# Patient Record
Sex: Female | Born: 1945 | Race: White | Hispanic: No | Marital: Married | Smoking: Never smoker
Health system: Southern US, Community
[De-identification: ages and names within clinical notes are randomized; demographics above are authoritative.]

## PROBLEM LIST (undated history)

## (undated) DIAGNOSIS — E871 Hypo-osmolality and hyponatremia: Secondary | ICD-10-CM

## (undated) DIAGNOSIS — K219 Gastro-esophageal reflux disease without esophagitis: Secondary | ICD-10-CM

## (undated) DIAGNOSIS — F0390 Unspecified dementia without behavioral disturbance: Secondary | ICD-10-CM

## (undated) DIAGNOSIS — IMO0001 Reserved for inherently not codable concepts without codable children: Secondary | ICD-10-CM

## (undated) DIAGNOSIS — F419 Anxiety disorder, unspecified: Secondary | ICD-10-CM

## (undated) DIAGNOSIS — I10 Essential (primary) hypertension: Secondary | ICD-10-CM

## (undated) DIAGNOSIS — I639 Cerebral infarction, unspecified: Secondary | ICD-10-CM

## (undated) DIAGNOSIS — R413 Other amnesia: Secondary | ICD-10-CM

## (undated) DIAGNOSIS — F1010 Alcohol abuse, uncomplicated: Secondary | ICD-10-CM

## (undated) DIAGNOSIS — R0602 Shortness of breath: Secondary | ICD-10-CM

## (undated) DIAGNOSIS — E785 Hyperlipidemia, unspecified: Secondary | ICD-10-CM

## (undated) DIAGNOSIS — R410 Disorientation, unspecified: Secondary | ICD-10-CM

## (undated) DIAGNOSIS — R569 Unspecified convulsions: Secondary | ICD-10-CM

## (undated) DIAGNOSIS — R296 Repeated falls: Secondary | ICD-10-CM

## (undated) DIAGNOSIS — K5790 Diverticulosis of intestine, part unspecified, without perforation or abscess without bleeding: Secondary | ICD-10-CM

## (undated) DIAGNOSIS — W19XXXA Unspecified fall, initial encounter: Secondary | ICD-10-CM

## (undated) DIAGNOSIS — F329 Major depressive disorder, single episode, unspecified: Secondary | ICD-10-CM

## (undated) DIAGNOSIS — T7840XA Allergy, unspecified, initial encounter: Secondary | ICD-10-CM

## (undated) DIAGNOSIS — M1990 Unspecified osteoarthritis, unspecified site: Secondary | ICD-10-CM

## (undated) DIAGNOSIS — F32A Depression, unspecified: Secondary | ICD-10-CM

## (undated) HISTORY — DX: Unspecified fall, initial encounter: W19.XXXA

## (undated) HISTORY — PX: TOTAL HIP ARTHROPLASTY: SHX124

## (undated) HISTORY — DX: Hyperlipidemia, unspecified: E78.5

## (undated) HISTORY — DX: Gastro-esophageal reflux disease without esophagitis: K21.9

## (undated) HISTORY — DX: Unspecified osteoarthritis, unspecified site: M19.90

## (undated) HISTORY — DX: Other amnesia: R41.3

## (undated) HISTORY — PX: EYE SURGERY: SHX253

## (undated) HISTORY — DX: Allergy, unspecified, initial encounter: T78.40XA

## (undated) HISTORY — DX: Major depressive disorder, single episode, unspecified: F32.9

## (undated) HISTORY — PX: JOINT REPLACEMENT: SHX530

## (undated) HISTORY — DX: Cerebral infarction, unspecified: I63.9

## (undated) HISTORY — DX: Repeated falls: R29.6

## (undated) HISTORY — DX: Reserved for inherently not codable concepts without codable children: IMO0001

## (undated) HISTORY — DX: Disorientation, unspecified: R41.0

## (undated) HISTORY — DX: Unspecified dementia, unspecified severity, without behavioral disturbance, psychotic disturbance, mood disturbance, and anxiety: F03.90

## (undated) HISTORY — PX: SHOULDER ARTHROSCOPY: SHX128

## (undated) HISTORY — PX: BREAST SURGERY: SHX581

## (undated) HISTORY — DX: Essential (primary) hypertension: I10

## (undated) HISTORY — DX: Depression, unspecified: F32.A

## (undated) HISTORY — DX: Alcohol abuse, uncomplicated: F10.10

## (undated) HISTORY — PX: ABDOMINAL HYSTERECTOMY: SHX81

## (undated) HISTORY — DX: Diverticulosis of intestine, part unspecified, without perforation or abscess without bleeding: K57.90

---

## 1988-10-16 HISTORY — PX: NASAL SINUS SURGERY: SHX719

## 1998-09-15 ENCOUNTER — Ambulatory Visit (HOSPITAL_COMMUNITY): Admission: RE | Admit: 1998-09-15 | Discharge: 1998-09-15 | Payer: Self-pay | Admitting: Internal Medicine

## 1998-09-15 ENCOUNTER — Encounter: Payer: Self-pay | Admitting: Internal Medicine

## 1999-01-11 ENCOUNTER — Emergency Department (HOSPITAL_COMMUNITY): Admission: EM | Admit: 1999-01-11 | Discharge: 1999-01-11 | Payer: Self-pay | Admitting: Emergency Medicine

## 1999-10-04 ENCOUNTER — Ambulatory Visit (HOSPITAL_COMMUNITY): Admission: RE | Admit: 1999-10-04 | Discharge: 1999-10-04 | Payer: Self-pay | Admitting: Specialist

## 1999-10-04 ENCOUNTER — Encounter: Payer: Self-pay | Admitting: Specialist

## 1999-10-12 ENCOUNTER — Encounter: Payer: Self-pay | Admitting: Internal Medicine

## 1999-10-12 ENCOUNTER — Ambulatory Visit (HOSPITAL_COMMUNITY): Admission: RE | Admit: 1999-10-12 | Discharge: 1999-10-12 | Payer: Self-pay | Admitting: Internal Medicine

## 1999-11-29 ENCOUNTER — Encounter: Admission: RE | Admit: 1999-11-29 | Discharge: 1999-12-28 | Payer: Self-pay | Admitting: Internal Medicine

## 2000-06-14 ENCOUNTER — Emergency Department (HOSPITAL_COMMUNITY): Admission: EM | Admit: 2000-06-14 | Discharge: 2000-06-15 | Payer: Self-pay | Admitting: Emergency Medicine

## 2000-08-09 ENCOUNTER — Encounter: Payer: Self-pay | Admitting: Specialist

## 2000-08-10 ENCOUNTER — Ambulatory Visit (HOSPITAL_COMMUNITY): Admission: RE | Admit: 2000-08-10 | Discharge: 2000-08-10 | Payer: Self-pay | Admitting: Specialist

## 2000-08-10 ENCOUNTER — Encounter: Payer: Self-pay | Admitting: Specialist

## 2000-10-05 ENCOUNTER — Other Ambulatory Visit: Admission: RE | Admit: 2000-10-05 | Discharge: 2000-10-05 | Payer: Self-pay | Admitting: Obstetrics & Gynecology

## 2000-10-21 ENCOUNTER — Emergency Department (HOSPITAL_COMMUNITY): Admission: EM | Admit: 2000-10-21 | Discharge: 2000-10-21 | Payer: Self-pay | Admitting: Emergency Medicine

## 2000-10-22 ENCOUNTER — Encounter: Payer: Self-pay | Admitting: Orthopedic Surgery

## 2000-10-29 ENCOUNTER — Inpatient Hospital Stay (HOSPITAL_COMMUNITY): Admission: RE | Admit: 2000-10-29 | Discharge: 2000-11-02 | Payer: Self-pay | Admitting: Orthopedic Surgery

## 2000-10-29 ENCOUNTER — Encounter: Payer: Self-pay | Admitting: Orthopedic Surgery

## 2001-07-09 HISTORY — PX: DOBUTAMINE STRESS ECHO: SHX5426

## 2002-07-17 ENCOUNTER — Ambulatory Visit (HOSPITAL_COMMUNITY): Admission: RE | Admit: 2002-07-17 | Discharge: 2002-07-17 | Payer: Self-pay | Admitting: Internal Medicine

## 2002-07-17 ENCOUNTER — Encounter: Payer: Self-pay | Admitting: Internal Medicine

## 2002-09-04 ENCOUNTER — Other Ambulatory Visit: Admission: RE | Admit: 2002-09-04 | Discharge: 2002-09-04 | Payer: Self-pay | Admitting: Internal Medicine

## 2003-05-17 ENCOUNTER — Emergency Department (HOSPITAL_COMMUNITY): Admission: EM | Admit: 2003-05-17 | Discharge: 2003-05-17 | Payer: Self-pay | Admitting: Emergency Medicine

## 2003-05-17 ENCOUNTER — Encounter: Payer: Self-pay | Admitting: Emergency Medicine

## 2003-12-17 ENCOUNTER — Other Ambulatory Visit: Admission: RE | Admit: 2003-12-17 | Discharge: 2003-12-17 | Payer: Self-pay | Admitting: Internal Medicine

## 2004-01-05 ENCOUNTER — Ambulatory Visit (HOSPITAL_COMMUNITY): Admission: RE | Admit: 2004-01-05 | Discharge: 2004-01-05 | Payer: Self-pay | Admitting: Internal Medicine

## 2004-01-09 ENCOUNTER — Emergency Department (HOSPITAL_COMMUNITY): Admission: EM | Admit: 2004-01-09 | Discharge: 2004-01-09 | Payer: Self-pay | Admitting: Emergency Medicine

## 2004-09-05 ENCOUNTER — Ambulatory Visit: Payer: Self-pay | Admitting: Internal Medicine

## 2004-10-05 ENCOUNTER — Ambulatory Visit: Payer: Self-pay | Admitting: Internal Medicine

## 2004-12-15 ENCOUNTER — Encounter: Admission: RE | Admit: 2004-12-15 | Discharge: 2004-12-15 | Payer: Self-pay | Admitting: Internal Medicine

## 2004-12-19 ENCOUNTER — Ambulatory Visit: Payer: Self-pay | Admitting: Pulmonary Disease

## 2005-02-15 ENCOUNTER — Ambulatory Visit: Payer: Self-pay | Admitting: Pulmonary Disease

## 2005-02-15 ENCOUNTER — Ambulatory Visit: Payer: Self-pay | Admitting: Internal Medicine

## 2005-04-12 ENCOUNTER — Ambulatory Visit: Payer: Self-pay | Admitting: Pulmonary Disease

## 2005-05-02 ENCOUNTER — Ambulatory Visit: Payer: Self-pay | Admitting: Pulmonary Disease

## 2005-05-25 ENCOUNTER — Ambulatory Visit: Payer: Self-pay | Admitting: Internal Medicine

## 2005-06-01 ENCOUNTER — Ambulatory Visit: Payer: Self-pay | Admitting: Internal Medicine

## 2005-07-17 ENCOUNTER — Ambulatory Visit: Payer: Self-pay | Admitting: Pulmonary Disease

## 2005-07-18 ENCOUNTER — Ambulatory Visit: Payer: Self-pay | Admitting: Internal Medicine

## 2005-07-27 ENCOUNTER — Ambulatory Visit: Payer: Self-pay | Admitting: Cardiology

## 2005-07-31 ENCOUNTER — Ambulatory Visit: Payer: Self-pay | Admitting: Pulmonary Disease

## 2005-08-16 ENCOUNTER — Ambulatory Visit: Payer: Self-pay | Admitting: Internal Medicine

## 2005-08-24 ENCOUNTER — Ambulatory Visit (HOSPITAL_COMMUNITY): Admission: RE | Admit: 2005-08-24 | Discharge: 2005-08-24 | Payer: Self-pay | Admitting: Internal Medicine

## 2005-08-29 ENCOUNTER — Ambulatory Visit (HOSPITAL_COMMUNITY): Admission: RE | Admit: 2005-08-29 | Discharge: 2005-08-29 | Payer: Self-pay | Admitting: Internal Medicine

## 2005-08-31 ENCOUNTER — Ambulatory Visit: Payer: Self-pay | Admitting: Internal Medicine

## 2005-09-18 ENCOUNTER — Ambulatory Visit: Payer: Self-pay | Admitting: Internal Medicine

## 2005-10-31 ENCOUNTER — Ambulatory Visit: Payer: Self-pay | Admitting: Pulmonary Disease

## 2005-11-03 ENCOUNTER — Ambulatory Visit: Payer: Self-pay | Admitting: Internal Medicine

## 2005-12-05 ENCOUNTER — Ambulatory Visit: Payer: Self-pay | Admitting: Pulmonary Disease

## 2005-12-25 ENCOUNTER — Ambulatory Visit: Payer: Self-pay | Admitting: Internal Medicine

## 2006-01-01 ENCOUNTER — Other Ambulatory Visit: Admission: RE | Admit: 2006-01-01 | Discharge: 2006-01-01 | Payer: Self-pay | Admitting: Internal Medicine

## 2006-01-01 ENCOUNTER — Ambulatory Visit: Payer: Self-pay | Admitting: Pulmonary Disease

## 2006-01-01 ENCOUNTER — Ambulatory Visit: Payer: Self-pay | Admitting: Internal Medicine

## 2006-03-01 ENCOUNTER — Ambulatory Visit: Payer: Self-pay | Admitting: Pulmonary Disease

## 2006-03-05 ENCOUNTER — Ambulatory Visit: Payer: Self-pay | Admitting: Pulmonary Disease

## 2006-03-19 ENCOUNTER — Ambulatory Visit: Payer: Self-pay | Admitting: Internal Medicine

## 2006-03-21 ENCOUNTER — Emergency Department (HOSPITAL_COMMUNITY): Admission: EM | Admit: 2006-03-21 | Discharge: 2006-03-22 | Payer: Self-pay | Admitting: Emergency Medicine

## 2006-03-29 ENCOUNTER — Ambulatory Visit: Payer: Self-pay | Admitting: Pulmonary Disease

## 2006-04-09 ENCOUNTER — Ambulatory Visit: Payer: Self-pay | Admitting: Internal Medicine

## 2006-05-03 ENCOUNTER — Ambulatory Visit: Payer: Self-pay | Admitting: Internal Medicine

## 2006-05-22 ENCOUNTER — Ambulatory Visit: Payer: Self-pay | Admitting: Internal Medicine

## 2006-05-24 ENCOUNTER — Ambulatory Visit: Payer: Self-pay | Admitting: Pulmonary Disease

## 2006-06-11 ENCOUNTER — Ambulatory Visit (HOSPITAL_COMMUNITY): Admission: RE | Admit: 2006-06-11 | Discharge: 2006-06-11 | Payer: Self-pay | Admitting: Internal Medicine

## 2006-06-11 HISTORY — PX: ESOPHAGOGASTRODUODENOSCOPY: SHX1529

## 2006-06-22 ENCOUNTER — Ambulatory Visit: Payer: Self-pay | Admitting: Internal Medicine

## 2006-07-16 ENCOUNTER — Ambulatory Visit: Payer: Self-pay | Admitting: Internal Medicine

## 2006-09-05 ENCOUNTER — Ambulatory Visit: Payer: Self-pay | Admitting: Pulmonary Disease

## 2006-09-11 ENCOUNTER — Ambulatory Visit: Payer: Self-pay | Admitting: Internal Medicine

## 2006-11-06 ENCOUNTER — Ambulatory Visit: Payer: Self-pay | Admitting: Internal Medicine

## 2007-01-03 ENCOUNTER — Ambulatory Visit: Payer: Self-pay | Admitting: Internal Medicine

## 2007-01-03 LAB — CONVERTED CEMR LAB
ALT: 21 units/L (ref 0–40)
Albumin: 3.6 g/dL (ref 3.5–5.2)
Alkaline Phosphatase: 69 units/L (ref 39–117)
BUN: 13 mg/dL (ref 6–23)
Basophils Relative: 0.8 % (ref 0.0–1.0)
CO2: 28 meq/L (ref 19–32)
Chloride: 102 meq/L (ref 96–112)
Eosinophils Absolute: 0.4 10*3/uL (ref 0.0–0.6)
Eosinophils Relative: 4.5 % (ref 0.0–5.0)
GFR calc Af Amer: 72 mL/min
GFR calc non Af Amer: 60 mL/min
Hemoglobin: 13.1 g/dL (ref 12.0–15.0)
LDL Cholesterol: 59 mg/dL (ref 0–99)
MCHC: 34.8 g/dL (ref 30.0–36.0)
Monocytes Absolute: 0.7 10*3/uL (ref 0.2–0.7)
Neutro Abs: 5.1 10*3/uL (ref 1.4–7.7)
Platelets: 465 10*3/uL — ABNORMAL HIGH (ref 150–400)
Potassium: 4.1 meq/L (ref 3.5–5.1)
TSH: 1.13 microintl units/mL (ref 0.35–5.50)
Triglycerides: 112 mg/dL (ref 0–149)

## 2007-01-10 ENCOUNTER — Ambulatory Visit: Payer: Self-pay | Admitting: Internal Medicine

## 2007-02-01 ENCOUNTER — Encounter: Admission: RE | Admit: 2007-02-01 | Discharge: 2007-02-01 | Payer: Self-pay | Admitting: Orthopedic Surgery

## 2007-04-25 ENCOUNTER — Ambulatory Visit: Payer: Self-pay | Admitting: Internal Medicine

## 2007-05-01 DIAGNOSIS — F329 Major depressive disorder, single episode, unspecified: Secondary | ICD-10-CM

## 2007-05-01 DIAGNOSIS — J45909 Unspecified asthma, uncomplicated: Secondary | ICD-10-CM | POA: Insufficient documentation

## 2007-05-01 DIAGNOSIS — IMO0001 Reserved for inherently not codable concepts without codable children: Secondary | ICD-10-CM | POA: Insufficient documentation

## 2007-05-01 DIAGNOSIS — I10 Essential (primary) hypertension: Secondary | ICD-10-CM | POA: Insufficient documentation

## 2007-05-01 DIAGNOSIS — N959 Unspecified menopausal and perimenopausal disorder: Secondary | ICD-10-CM | POA: Insufficient documentation

## 2007-05-01 DIAGNOSIS — F3289 Other specified depressive episodes: Secondary | ICD-10-CM | POA: Insufficient documentation

## 2007-05-01 DIAGNOSIS — J309 Allergic rhinitis, unspecified: Secondary | ICD-10-CM | POA: Insufficient documentation

## 2007-05-01 DIAGNOSIS — K5730 Diverticulosis of large intestine without perforation or abscess without bleeding: Secondary | ICD-10-CM | POA: Insufficient documentation

## 2007-05-01 DIAGNOSIS — F410 Panic disorder [episodic paroxysmal anxiety] without agoraphobia: Secondary | ICD-10-CM | POA: Insufficient documentation

## 2007-07-15 ENCOUNTER — Encounter: Admission: RE | Admit: 2007-07-15 | Discharge: 2007-07-15 | Payer: Self-pay | Admitting: Orthopedic Surgery

## 2007-07-25 ENCOUNTER — Ambulatory Visit: Payer: Self-pay | Admitting: Internal Medicine

## 2007-07-25 DIAGNOSIS — K219 Gastro-esophageal reflux disease without esophagitis: Secondary | ICD-10-CM | POA: Insufficient documentation

## 2007-07-25 DIAGNOSIS — M1990 Unspecified osteoarthritis, unspecified site: Secondary | ICD-10-CM | POA: Insufficient documentation

## 2007-08-07 ENCOUNTER — Inpatient Hospital Stay (HOSPITAL_COMMUNITY): Admission: RE | Admit: 2007-08-07 | Discharge: 2007-08-10 | Payer: Self-pay | Admitting: Orthopedic Surgery

## 2007-08-10 DIAGNOSIS — J383 Other diseases of vocal cords: Secondary | ICD-10-CM

## 2007-08-14 ENCOUNTER — Telehealth (INDEPENDENT_AMBULATORY_CARE_PROVIDER_SITE_OTHER): Payer: Self-pay | Admitting: *Deleted

## 2007-09-09 ENCOUNTER — Telehealth: Payer: Self-pay | Admitting: Internal Medicine

## 2007-09-20 ENCOUNTER — Telehealth (INDEPENDENT_AMBULATORY_CARE_PROVIDER_SITE_OTHER): Payer: Self-pay | Admitting: *Deleted

## 2007-09-21 ENCOUNTER — Ambulatory Visit: Payer: Self-pay | Admitting: Family Medicine

## 2007-09-21 LAB — CONVERTED CEMR LAB
Nitrite: POSITIVE
pH: 5

## 2007-09-23 ENCOUNTER — Telehealth: Payer: Self-pay | Admitting: Internal Medicine

## 2007-09-27 ENCOUNTER — Ambulatory Visit: Payer: Self-pay | Admitting: Internal Medicine

## 2007-10-31 ENCOUNTER — Ambulatory Visit: Payer: Self-pay | Admitting: Internal Medicine

## 2007-11-15 ENCOUNTER — Ambulatory Visit: Payer: Self-pay | Admitting: Internal Medicine

## 2007-11-15 DIAGNOSIS — E739 Lactose intolerance, unspecified: Secondary | ICD-10-CM

## 2007-11-15 LAB — CONVERTED CEMR LAB
Chloride: 98 meq/L (ref 96–112)
Creatinine, Ser: 1.11 mg/dL (ref 0.40–1.20)
Glucose, Bld: 94 mg/dL (ref 70–99)
Hgb A1c MFr Bld: 5.5 % (ref 4.6–6.1)
Potassium: 4.4 meq/L (ref 3.5–5.3)
Sodium: 133 meq/L — ABNORMAL LOW (ref 135–145)

## 2007-11-21 ENCOUNTER — Telehealth: Payer: Self-pay | Admitting: Internal Medicine

## 2007-12-27 ENCOUNTER — Telehealth: Payer: Self-pay | Admitting: Internal Medicine

## 2007-12-30 ENCOUNTER — Telehealth: Payer: Self-pay | Admitting: Internal Medicine

## 2008-01-02 ENCOUNTER — Ambulatory Visit: Payer: Self-pay | Admitting: Internal Medicine

## 2008-01-02 DIAGNOSIS — J441 Chronic obstructive pulmonary disease with (acute) exacerbation: Secondary | ICD-10-CM | POA: Insufficient documentation

## 2008-02-13 ENCOUNTER — Telehealth: Payer: Self-pay | Admitting: Internal Medicine

## 2008-03-20 ENCOUNTER — Ambulatory Visit: Payer: Self-pay | Admitting: Internal Medicine

## 2008-03-20 ENCOUNTER — Ambulatory Visit (HOSPITAL_COMMUNITY): Admission: RE | Admit: 2008-03-20 | Discharge: 2008-03-20 | Payer: Self-pay | Admitting: Internal Medicine

## 2008-03-20 DIAGNOSIS — G219 Secondary parkinsonism, unspecified: Secondary | ICD-10-CM

## 2008-04-09 ENCOUNTER — Ambulatory Visit: Payer: Self-pay | Admitting: Internal Medicine

## 2008-07-14 ENCOUNTER — Ambulatory Visit: Payer: Self-pay | Admitting: Internal Medicine

## 2008-07-16 ENCOUNTER — Telehealth: Payer: Self-pay | Admitting: Internal Medicine

## 2008-07-23 ENCOUNTER — Telehealth: Payer: Self-pay | Admitting: Internal Medicine

## 2008-08-28 ENCOUNTER — Telehealth: Payer: Self-pay | Admitting: Internal Medicine

## 2008-08-28 ENCOUNTER — Encounter: Payer: Self-pay | Admitting: Internal Medicine

## 2008-09-07 ENCOUNTER — Telehealth: Payer: Self-pay | Admitting: Internal Medicine

## 2008-09-14 ENCOUNTER — Ambulatory Visit: Payer: Self-pay | Admitting: Internal Medicine

## 2008-09-15 LAB — CONVERTED CEMR LAB
Basophils Absolute: 0 10*3/uL (ref 0.0–0.1)
Basophils Relative: 0 % (ref 0.0–3.0)
CRP, High Sensitivity: 6 — ABNORMAL HIGH (ref 0.00–5.00)
Chloride: 94 meq/L — ABNORMAL LOW (ref 96–112)
Eosinophils Relative: 5.8 % — ABNORMAL HIGH (ref 0.0–5.0)
GFR calc Af Amer: 72 mL/min
GFR calc non Af Amer: 60 mL/min
Lymphocytes Relative: 18.7 % (ref 12.0–46.0)
Monocytes Absolute: 0.2 10*3/uL (ref 0.1–1.0)
Monocytes Relative: 2.7 % — ABNORMAL LOW (ref 3.0–12.0)
Neutro Abs: 6.7 10*3/uL (ref 1.4–7.7)
Neutrophils Relative %: 72.8 % (ref 43.0–77.0)
Platelets: 415 10*3/uL — ABNORMAL HIGH (ref 150–400)
TSH: 0.96 microintl units/mL (ref 0.35–5.50)

## 2008-09-29 ENCOUNTER — Telehealth: Payer: Self-pay | Admitting: *Deleted

## 2008-10-06 ENCOUNTER — Telehealth: Payer: Self-pay | Admitting: Internal Medicine

## 2008-10-15 ENCOUNTER — Telehealth: Payer: Self-pay | Admitting: Family Medicine

## 2008-10-26 ENCOUNTER — Ambulatory Visit: Payer: Self-pay | Admitting: Internal Medicine

## 2008-11-03 ENCOUNTER — Telehealth: Payer: Self-pay | Admitting: Internal Medicine

## 2008-11-09 ENCOUNTER — Encounter: Payer: Self-pay | Admitting: Internal Medicine

## 2008-12-02 ENCOUNTER — Telehealth: Payer: Self-pay | Admitting: Internal Medicine

## 2008-12-25 ENCOUNTER — Ambulatory Visit: Payer: Self-pay | Admitting: Internal Medicine

## 2009-01-14 ENCOUNTER — Telehealth: Payer: Self-pay | Admitting: Internal Medicine

## 2009-01-22 ENCOUNTER — Ambulatory Visit: Payer: Self-pay | Admitting: Internal Medicine

## 2009-02-23 ENCOUNTER — Telehealth: Payer: Self-pay | Admitting: Internal Medicine

## 2009-03-05 ENCOUNTER — Telehealth: Payer: Self-pay | Admitting: Internal Medicine

## 2009-04-05 ENCOUNTER — Ambulatory Visit: Payer: Self-pay | Admitting: Internal Medicine

## 2009-04-17 ENCOUNTER — Telehealth (INDEPENDENT_AMBULATORY_CARE_PROVIDER_SITE_OTHER): Payer: Self-pay | Admitting: *Deleted

## 2009-04-17 ENCOUNTER — Emergency Department (HOSPITAL_COMMUNITY): Admission: EM | Admit: 2009-04-17 | Discharge: 2009-04-17 | Payer: Self-pay | Admitting: Emergency Medicine

## 2009-04-20 ENCOUNTER — Telehealth: Payer: Self-pay | Admitting: Internal Medicine

## 2009-04-20 DIAGNOSIS — R079 Chest pain, unspecified: Secondary | ICD-10-CM | POA: Insufficient documentation

## 2009-04-22 ENCOUNTER — Telehealth (INDEPENDENT_AMBULATORY_CARE_PROVIDER_SITE_OTHER): Payer: Self-pay | Admitting: *Deleted

## 2009-04-26 ENCOUNTER — Ambulatory Visit: Payer: Self-pay

## 2009-04-26 ENCOUNTER — Encounter: Payer: Self-pay | Admitting: Cardiovascular Disease

## 2009-05-21 ENCOUNTER — Telehealth: Payer: Self-pay | Admitting: Internal Medicine

## 2009-06-10 ENCOUNTER — Telehealth: Payer: Self-pay | Admitting: Internal Medicine

## 2009-06-15 ENCOUNTER — Telehealth: Payer: Self-pay | Admitting: Internal Medicine

## 2009-08-09 ENCOUNTER — Telehealth: Payer: Self-pay | Admitting: Internal Medicine

## 2009-08-17 ENCOUNTER — Telehealth: Payer: Self-pay | Admitting: Internal Medicine

## 2009-09-07 ENCOUNTER — Ambulatory Visit: Payer: Self-pay | Admitting: Internal Medicine

## 2009-09-14 ENCOUNTER — Telehealth: Payer: Self-pay | Admitting: Internal Medicine

## 2009-10-01 ENCOUNTER — Telehealth: Payer: Self-pay | Admitting: Internal Medicine

## 2009-10-19 ENCOUNTER — Telehealth: Payer: Self-pay | Admitting: Internal Medicine

## 2009-10-19 ENCOUNTER — Emergency Department (HOSPITAL_COMMUNITY): Admission: EM | Admit: 2009-10-19 | Discharge: 2009-10-19 | Payer: Self-pay | Admitting: Emergency Medicine

## 2009-10-23 ENCOUNTER — Ambulatory Visit: Payer: Self-pay | Admitting: Family Medicine

## 2009-11-04 ENCOUNTER — Telehealth: Payer: Self-pay | Admitting: Internal Medicine

## 2009-11-05 ENCOUNTER — Ambulatory Visit: Payer: Self-pay | Admitting: Family Medicine

## 2009-11-08 ENCOUNTER — Encounter: Admission: RE | Admit: 2009-11-08 | Discharge: 2009-11-08 | Payer: Self-pay | Admitting: Family Medicine

## 2009-11-08 ENCOUNTER — Telehealth (INDEPENDENT_AMBULATORY_CARE_PROVIDER_SITE_OTHER): Payer: Self-pay | Admitting: *Deleted

## 2009-11-09 ENCOUNTER — Encounter: Payer: Self-pay | Admitting: Internal Medicine

## 2009-11-09 LAB — CONVERTED CEMR LAB
HCT: 41.1 %
Hemoglobin: 13.1 g/dL
Platelets: 15.4 10*3/uL
Total Bilirubin: 0.4 mg/dL
Total Protein: 6.9 g/dL

## 2009-11-12 ENCOUNTER — Encounter: Payer: Self-pay | Admitting: Internal Medicine

## 2009-11-12 LAB — CONVERTED CEMR LAB: WBC: 8.2 10*3/uL

## 2009-11-29 ENCOUNTER — Encounter: Payer: Self-pay | Admitting: Internal Medicine

## 2009-12-24 ENCOUNTER — Ambulatory Visit: Payer: Self-pay | Admitting: Internal Medicine

## 2010-02-21 ENCOUNTER — Telehealth: Payer: Self-pay | Admitting: Internal Medicine

## 2010-02-25 ENCOUNTER — Telehealth: Payer: Self-pay | Admitting: Internal Medicine

## 2010-03-03 ENCOUNTER — Telehealth: Payer: Self-pay | Admitting: Internal Medicine

## 2010-03-07 ENCOUNTER — Telehealth: Payer: Self-pay | Admitting: Internal Medicine

## 2010-03-08 DIAGNOSIS — F1010 Alcohol abuse, uncomplicated: Secondary | ICD-10-CM | POA: Insufficient documentation

## 2010-03-17 ENCOUNTER — Telehealth: Payer: Self-pay | Admitting: Internal Medicine

## 2010-03-25 ENCOUNTER — Ambulatory Visit: Payer: Self-pay | Admitting: Internal Medicine

## 2010-03-29 ENCOUNTER — Encounter: Payer: Self-pay | Admitting: Internal Medicine

## 2010-04-04 ENCOUNTER — Telehealth: Payer: Self-pay | Admitting: Internal Medicine

## 2010-04-25 ENCOUNTER — Ambulatory Visit: Payer: Self-pay | Admitting: Internal Medicine

## 2010-05-23 ENCOUNTER — Emergency Department (HOSPITAL_COMMUNITY): Admission: EM | Admit: 2010-05-23 | Discharge: 2010-05-23 | Payer: Self-pay | Admitting: Family Medicine

## 2010-05-23 ENCOUNTER — Telehealth: Payer: Self-pay | Admitting: Internal Medicine

## 2010-06-01 ENCOUNTER — Telehealth: Payer: Self-pay | Admitting: Internal Medicine

## 2010-06-06 ENCOUNTER — Telehealth: Payer: Self-pay | Admitting: Internal Medicine

## 2010-06-22 ENCOUNTER — Encounter: Payer: Self-pay | Admitting: Internal Medicine

## 2010-06-22 ENCOUNTER — Ambulatory Visit: Payer: Self-pay | Admitting: Family Medicine

## 2010-06-22 DIAGNOSIS — R413 Other amnesia: Secondary | ICD-10-CM

## 2010-06-24 LAB — CONVERTED CEMR LAB
BUN: 24 mg/dL — ABNORMAL HIGH (ref 6–23)
Calcium: 9.8 mg/dL (ref 8.4–10.5)
GFR calc non Af Amer: 51.86 mL/min (ref >60)
Potassium: 5.2 meq/L — ABNORMAL HIGH (ref 3.5–5.1)
TSH: 0.57 microintl units/mL (ref 0.35–5.50)
Vitamin B-12: >1500 pg/mL — ABNORMAL HIGH (ref 211–911)

## 2010-06-28 ENCOUNTER — Ambulatory Visit: Payer: Self-pay | Admitting: Cardiology

## 2010-07-04 ENCOUNTER — Telehealth: Payer: Self-pay | Admitting: Internal Medicine

## 2010-07-12 ENCOUNTER — Telehealth: Payer: Self-pay | Admitting: *Deleted

## 2010-07-13 ENCOUNTER — Ambulatory Visit: Payer: Self-pay | Admitting: Internal Medicine

## 2010-08-08 ENCOUNTER — Telehealth: Payer: Self-pay | Admitting: Internal Medicine

## 2010-08-19 ENCOUNTER — Telehealth: Payer: Self-pay | Admitting: Internal Medicine

## 2010-08-23 ENCOUNTER — Telehealth: Payer: Self-pay | Admitting: Internal Medicine

## 2010-09-04 ENCOUNTER — Emergency Department (HOSPITAL_COMMUNITY): Admission: EM | Admit: 2010-09-04 | Discharge: 2010-09-04 | Payer: Self-pay | Admitting: Family Medicine

## 2010-09-06 ENCOUNTER — Encounter: Payer: Self-pay | Admitting: Internal Medicine

## 2010-09-14 ENCOUNTER — Ambulatory Visit: Payer: Self-pay | Admitting: Internal Medicine

## 2010-09-14 DIAGNOSIS — L723 Sebaceous cyst: Secondary | ICD-10-CM

## 2010-10-03 ENCOUNTER — Telehealth: Payer: Self-pay | Admitting: Internal Medicine

## 2010-10-27 ENCOUNTER — Telehealth: Payer: Self-pay | Admitting: Internal Medicine

## 2010-11-02 ENCOUNTER — Telehealth: Payer: Self-pay | Admitting: Internal Medicine

## 2010-11-03 ENCOUNTER — Ambulatory Visit
Admission: RE | Admit: 2010-11-03 | Discharge: 2010-11-03 | Payer: Self-pay | Source: Home / Self Care | Attending: Internal Medicine | Admitting: Internal Medicine

## 2010-11-03 ENCOUNTER — Other Ambulatory Visit: Payer: Self-pay | Admitting: Internal Medicine

## 2010-11-03 ENCOUNTER — Encounter
Admission: RE | Admit: 2010-11-03 | Discharge: 2010-11-03 | Payer: Self-pay | Source: Home / Self Care | Attending: Internal Medicine | Admitting: Internal Medicine

## 2010-11-03 LAB — HEPATIC FUNCTION PANEL
ALT: 23 U/L (ref 0–35)
AST: 28 U/L (ref 0–37)
Albumin: 3.7 g/dL (ref 3.5–5.2)
Alkaline Phosphatase: 66 U/L (ref 39–117)
Bilirubin, Direct: 0 mg/dL (ref 0.0–0.3)
Total Bilirubin: 0.4 mg/dL (ref 0.3–1.2)
Total Protein: 6.5 g/dL (ref 6.0–8.3)

## 2010-11-03 LAB — CBC WITH DIFFERENTIAL/PLATELET
Basophils Absolute: 0 10*3/uL (ref 0.0–0.1)
Basophils Relative: 0.5 % (ref 0.0–3.0)
Eosinophils Absolute: 0.3 10*3/uL (ref 0.0–0.7)
Eosinophils Relative: 2.8 % (ref 0.0–5.0)
HCT: 39.8 % (ref 36.0–46.0)
Hemoglobin: 13 g/dL (ref 12.0–15.0)
Lymphocytes Relative: 23.2 % (ref 12.0–46.0)
Lymphs Abs: 2.5 10*3/uL (ref 0.7–4.0)
MCHC: 32.7 g/dL (ref 30.0–36.0)
MCV: 87.3 fl (ref 78.0–100.0)
Monocytes Absolute: 0.6 10*3/uL (ref 0.1–1.0)
Monocytes Relative: 6.1 % (ref 3.0–12.0)
Neutro Abs: 7.2 10*3/uL (ref 1.4–7.7)
Neutrophils Relative %: 67.4 % (ref 43.0–77.0)
Platelets: 560 10*3/uL — ABNORMAL HIGH (ref 150.0–400.0)
RBC: 4.56 Mil/uL (ref 3.87–5.11)
RDW: 17.4 % — ABNORMAL HIGH (ref 11.5–14.6)
WBC: 10.6 10*3/uL — ABNORMAL HIGH (ref 4.5–10.5)

## 2010-11-03 LAB — LIPASE: Lipase: 26 U/L (ref 11.0–59.0)

## 2010-11-03 LAB — AMYLASE: Amylase: 30 U/L (ref 27–131)

## 2010-11-04 ENCOUNTER — Telehealth: Payer: Self-pay

## 2010-11-06 ENCOUNTER — Encounter: Payer: Self-pay | Admitting: Internal Medicine

## 2010-11-16 ENCOUNTER — Other Ambulatory Visit: Payer: Self-pay | Admitting: Internal Medicine

## 2010-11-16 DIAGNOSIS — M069 Rheumatoid arthritis, unspecified: Secondary | ICD-10-CM

## 2010-11-16 NOTE — Progress Notes (Signed)
Addended by: Melchor Amour on: 11/16/2010 03:01 PM   Modules accepted: Orders

## 2010-11-17 NOTE — Progress Notes (Signed)
Summary: Rx Request  Phone Note Call from Patient Call back at Home Phone [phone]   Caller: Patient Summary of Call: Would like rx of anpabuse  called [facility] to [facility] [address]. Initial call taken by: Trixie Dredge,  Feb 25, 2010 10:52 AM    New/Updated Medications: ANTABUSE 250 MG TABS (DISULFIRAM) 1 once daily Prescriptions: ANTABUSE 250 MG TABS (DISULFIRAM) 1 once daily  #30 x 3   Entered by:   Willy Eddy, LPN   Authorized by:   Stacie Glaze MD   Signed by:   Willy Eddy, LPN on 88/41/6606   Method used:   [facility]        [facility] [id number]       [address]       [place], [place]  [address]       Ph: [phone]       Fax: [phone]   RxID:   [id number]

## 2010-11-17 NOTE — Progress Notes (Signed)
Summary: Pt req a cold med called in to [facility]  Phone Note Call from Patient Call back at Home Phone [phone]   Caller: Patient Summary of Call: Pt called and said that she has gotten a cold and it is down in her chest. Pt has asthma and it is diff to breathe. Pt req that Dr Lovell Sheehan [facility] a [facility] [address]  Initial call taken by: Lucy Antigua,  October 03, 2010 9:50 AM  Follow-up for Phone Call        per dr Lovell Sheehan- may have z pack and prednisone 10mg  6 days prednisone dose pack  Follow-up by: Willy Eddy, LPN,  October 03, 2010 12:16 PM    New/Updated Medications: PREDNISONE (PAK) 10 MG TABS (PREDNISONE) as directed ZITHROMAX Z-PAK 250 MG TABS (AZITHROMYCIN) as directed Prescriptions: ZITHROMAX Z-PAK 250 MG TABS (AZITHROMYCIN) as directed  #6 x 0   Entered by:   Lynann Beaver CMA AAMA   Authorized by:   Stacie Glaze MD   Signed by:   Lynann Beaver CMA AAMA on 10/03/2010   Method used:   Electronically to        [facility] [address] [id number]       [address]       [place], [place]  [address]       Ph: [phone]       Fax: [phone]   RxID:   [id number] PREDNISONE (PAK) 10 MG TABS (PREDNISONE) as directed  #1 pack x 0   Entered by:   Lynann Beaver CMA AAMA   Authorized by:   Stacie Glaze MD   Signed by:   Lynann Beaver CMA AAMA on 10/03/2010   Method used:   Electronically to        [facility] [phone]* (retail)       [address]       [place], [place]  [address]       Ph: [phone]       Fax: [phone]   RxID:   [id number]

## 2010-11-17 NOTE — Progress Notes (Signed)
Summary: [facility] drug interaction with [facility]ts Celexa and Protonix  Phone Note From Pharmacy Call back at [phone]  [place]   [facility]er[facility]  [address] [id number]    Summary of Call: There is a drug interaction for pts Celexa and Protonix. Pls call asap.  Pharmacy faxed this notification for drug interaction on 08/19/10 and has not gotten response back.  Initial call taken by: Lucy Antigua,  August 23, 2010 10:22 AM    New/Updated Medications: PANTOPRAZOLE SODIUM 20 MG TBEC (PANTOPRAZOLE SODIUM) 1 once daily Prescriptions: PANTOPRAZOLE SODIUM 20 MG TBEC (PANTOPRAZOLE SODIUM) 1 once daily  #30 x 6   Entered by:   Willy Eddy, LPN   Authorized by:   Stacie Glaze MD   Signed by:   Willy Eddy, LPN on 19/14/7829   Method used:   Electronically to        [facility]  [address] [phone]* (retail)       [address]       [place], [place]  [address]       Ph: [phone]       Fax: [phone]   RxID:   [id number]

## 2010-11-17 NOTE — Progress Notes (Signed)
Summary: appt?  Phone Note Call from Patient   Caller: Patient Call For: Stacie Glaze MD Summary of Call: Pain under right rib cage, and wants to see Dr. Lovell Sheehan tomorrow.  [facility] [phone] Pt had been sick with congestion and cough but now abdomen is sore with the pain under ribs. Initial call taken by: Lynann Beaver CMA,  November 04, 2009 1:48 PM  Follow-up for Phone Call        Scheduled with Dr. Caryl Never tomorrow. Follow-up by: Lynann Beaver CMA,  November 04, 2009 1:54 PM

## 2010-11-17 NOTE — Progress Notes (Signed)
Summary: being a good girl  Phone Note Call from Patient Call back at Home Phone [phone]   Caller: vm Call For: Aimee Jones Summary of Call: Chest a little tight, but not coughing up anything since Christmas 5 days. Little runny nose, not much.   Wake up tired, dragging, don't feel well, like I have the flu.  He watches me for pneumonia. Initial call taken by: Rudy Jew, RN,  October 19, 2009 10:05 AM  Follow-up for Phone Call        give appointment in blocked spot for tomorrow Follow-up by: Stacie Glaze MD,  October 19, 2009 12:58 PM  Additional Follow-up for Phone Call Additional follow up Details #1::        Appointment made.  She wants Dr. Shela Commons to know she's being a good girl. Additional Follow-up by: Rudy Jew, RN,  October 19, 2009 1:43 PM    Additional Follow-up for Phone Call Additional follow up Details #2::    ok Follow-up by: Willy Eddy, LPN,  October 19, 2009 3:07 PM

## 2010-11-17 NOTE — Progress Notes (Signed)
Summary: pt requesting meds  Phone Note Call from Patient Call back at Home Phone [phone]   Caller: Patient Call For: Stacie Glaze MD Reason for Call: Acute Illness Summary of Call: pt needs something [facility] fibromyagia and osteoarthitis Initial call taken by: Heron Sabins,  July 04, 2010 12:18 PM  Follow-up for Phone Call        left message on machine on celebrex- try that and if doesnt help let us kn ow Follow-up by: Willy Eddy, LPN,  July 04, 2010 12:40 PM

## 2010-11-17 NOTE — Progress Notes (Signed)
Summary: FYI  Phone Note Call from Patient   Caller: Patient Call For: Stacie Glaze MD Summary of Call: Pt is at [facility] with an infected finger, and wanted to know if Dr. Lovell Sheehan could see her here before 5 pm.  Advised her to stay there and get evaluated. Initial call taken by: Lynann Beaver CMA,  May 23, 2010 4:35 PM  Follow-up for Phone Call        dr Lovell Sheehan agrees for her to stay there and be seen Follow-up by: Willy Eddy, LPN,  May 24, 2010 9:02 AM

## 2010-11-17 NOTE — Assessment & Plan Note (Signed)
Summary: nausea/ shoulder pain.dm   Vital Signs:  Patient profile:   65 year old female Weight:      198 pounds O2 Sat:      94 % Temp:     98.3 degrees F Pulse rate:   108 / minute BP sitting:   110 / 76  (left arm) Cuff size:   large  Vitals Entered By: Pura Spice, RN (November 03, 2010 1:25 PM) CC: nausea went for ultrasound this am . achy all over on cipro for UTI  has 2 more days of this. c/o rt shoulder pain   Primary Care Provider:  Stacie Glaze MD  CC:  nausea went for ultrasound this am . achy all over on cipro for UTI  has 2 more days of this. c/o rt shoulder pain.  History of Present Illness: Patient presents to clinic as a workin for evaluation of muscle pain. Notes one week h/o upper neck/right trapezius pain without injury/trauma, arm radiation, paresthesias or weakness.  Has associated ha's without neurologic deficit. Heat helps symptoms. Also notes 1wk h/o periumbilical pain without radiation. +associated nausea without emesis. Pain worse with food but has improved today. Reviewed abd US performed this am showing no GB pathology with incidental notation of liver cysts. Also states recently being tx'ed for UTI with cipro day #3/5 with improving dysuria.   Allergies: 1)  Pcn 2)  * Latex  Past History:  Past medical, surgical, family and social histories (including risk factors) reviewed for relevance to current acute and chronic problems.  Past Medical History: Reviewed history from 10/26/2008 and no changes required. ETOH Allergic rhinitis Diverticulosis, colon Hypertension Depression Osteoarthritis Asthma GERD  Past Surgical History: Reviewed history from 09/27/2007 and no changes required. 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 Family  History of Alcoholism/Addiction Family History Diabetes 1st degree relative Family History of Cardiovascular disorder  Social History: Reviewed history from 07/25/2007 and no changes required. Married Never Smoked Alcohol use-yes Drug use-no Regular exercise-no  Review of Systems General:  Denies chills, fatigue, fever, and sweats. Eyes:  Denies eye irritation, eye pain, and red eye. CV:  Denies chest pain or discomfort, lightheadness, and shortness of breath with exertion. Resp:  Denies chest discomfort, cough, and shortness of breath. GI:  Complains of abdominal pain and nausea; denies bloody stools, change in bowel habits, dark tarry stools, loss of appetite, vomiting, vomiting blood, and yellowish skin color. MS:  Complains of muscle aches; denies joint pain, joint redness, joint swelling, loss of strength, low back pain, mid back pain, and muscle weakness. Derm:  Denies changes in color of skin and rash. Neuro:  Complains of headaches; denies brief paralysis, falling down, numbness, tingling, and weakness.  Physical Exam  General:  Well-developed,well-nourished,in no acute distress; alert,appropriate and cooperative throughout examination Head:  Normocephalic and atraumatic without obvious abnormalities. No apparent alopecia or balding. Eyes:  pupils equal, pupils round, corneas and lenses clear, and no injection.   Ears:  no external deformities.   Nose:  no external deformity.   Lungs:  Normal respiratory effort, chest expands symmetrically. Lungs are clear to auscultation, no crackles or wheezes. Heart:  Normal rate and regular rhythm. S1 and S2 normal without gallop, murmur, click, rub or other extra sounds. Abdomen:  soft, normal bowel sounds, no distention, no masses, no  guarding, no rigidity, no rebound tenderness, no abdominal hernia, no hepatomegaly, and no splenomegaly.  +mild tenderness luq quandrant Msk:  No midline cervical tenderness. +tenderness right paraspinal  muscle tenderness. +right trapezius tenderness. FROM of neck and supple. FROM irght arm. RUE strength 5/5. Extremities:  No clubbing, cyanosis, edema, or deformity noted with normal full range of motion of all joints.   Neurologic:  alert & oriented X3 and gait normal.     Impression & Recommendations:  Problem # 1:  ABDOMINAL PAIN, UNSPECIFIED SITE (ICD-789.00) Assessment New Obtain cbc, lft, amylase/lipase. Sx's improving. Limit nsaids and alcohol. Continue ppi. Followup if no improvement or worsening.  Orders: Venipuncture ([address]) Specimen Handling (60454) TLB-CBC Platelet - w/Differential (85025-CBCD) TLB-Hepatic/Liver Function Pnl (80076-HEPATIC) TLB-Amylase (82150-AMYL) TLB-Lipase (83690-LIPASE)  Problem # 2:  NECK PAIN (ICD-723.1) Assessment: New Toradol injxn. Attempt flexeril as needed .Cautioned UJ:WJXBJYNW sedating effect. Use heating pad as needed. Followup if no improvement or worsening.  Her updated medication list for this problem includes:    Celebrex 200 Mg Caps (Celecoxib) ..... One by mouth daily    Vicoprofen 7.5-200 Mg Tabs (Hydrocodone-ibuprofen) ..... Q 12 hrs as needed  not to exceed two a day    Flexeril 10 Mg Tabs (Cyclobenzaprine hcl) ..... One by mouth three times a day as needed muscle spasm  Orders: Ketorolac-Toradol 15mg  (G9562)  Complete Medication List: 1)  Imipramine Hcl 50 Mg Tabs (Imipramine hcl) .... 3 at bedtime 2)  Lipitor 10 Mg Tabs (Atorvastatin calcium) .... Once daily 3)  Premarin 0.625 Mg Tabs (Estrogens conjugated) .... Once daily 4)  Flonase 50 Mcg/act Susp (Fluticasone propionate) .... Once daily 5)  Pantoprazole Sodium 40 Mg Tbec (Pantoprazole sodium) .Marland Kitchen.. 1 once daily 6)  Celebrex 200 Mg Caps (Celecoxib) .... One by mouth daily 7)  Vicoprofen 7.5-200 Mg Tabs (Hydrocodone-ibuprofen) .... Q 12 hrs as needed  not to exceed two a day 8)  Symbicort 80-4.5 Mcg/act Aero (Budesonide-formoterol fumarate) .... 2 puffs once daily 9)   Alprazolam 0.5 Mg Tbdp (Alprazolam) .... One by mouth three times a day as needed for anxiety 10)  Glucosamine-chondroitin 1500-1200 Mg/48ml Liqd (Glucosamine-chondroitin) .Marland Kitchen.. 1 once daily 11)  Fish Oil 1000 Mg Caps (Omega-3 fatty acids) .Marland Kitchen.. 1 once daily 12)  Zinc  13)  Benicar Hct 40-12.5 Mg Tabs (Olmesartan medoxomil-hctz) .Marland Kitchen.. 1 once daily 14)  Vitamin B-1 100 Mg Tabs (Thiamine hcl) .... One by mouth daily 15)  Folbee 2.5-25-1 Mg Tabs (Folic acid-vit b6-vit b12) .... One by mouth daily 16)  Doxepin Hcl 25 Mg Caps (Doxepin hcl) .... Once daily 17)  Claritin 10 Mg Tabs (Loratadine) .Marland Kitchen.. 1 once daily 18)  Promethazine Hcl 25 Mg Tabs (Promethazine hcl) .Marland Kitchen.. 1 every 4-6 hours as needed n &v 19)  Citalopram Hydrobromide 20 Mg Tabs (Citalopram hydrobromide) .Marland Kitchen.. 1 once daily 20)  Smz-tmp Ds 800-160 Mg Tabs (Sulfamethoxazole-trimethoprim) .... One by mouth two times a day 21)  Prednisone (pak) 10 Mg Tabs (Prednisone) .... As directed 22)  Zithromax Z-pak 250 Mg Tabs (Azithromycin) .... As directed 23)  Cipro 250 Mg Tabs (Ciprofloxacin hcl) .... One by mouth two times a day x 5 days 24)  Promethazine Hcl 25 Mg Tabs (Promethazine hcl) .... One by mouth q 6 hours nausea 25)  Flexeril 10 Mg Tabs (Cyclobenzaprine hcl) .... One by mouth three times a day as needed muscle spasm Prescriptions: FLEXERIL 10 MG TABS (CYCLOBENZAPRINE HCL) one by mouth three times a day as needed muscle spasm  #20 x 0  Entered and Authorized by:   Edwyna Perfect MD   Signed by:   Edwyna Perfect MD on 11/03/2010   Method used:   [facility]        [facility]  [address] [phone]* (retail)       [address]       [place], [place]  [address]       Ph: [phone]       Fax: [phone]   RxID:   [id number]    Medication Administration  Injection # 1:    Medication: Ketorolac-Toradol 15mg     Route: IM    Site: RUOQ gluteus    Exp Date: 12/15/2011    Lot #: [id number]    Mfr: hospira     Given by: Kyung Rudd, CMA (November 03, 2010 2:24 PM)  Orders Added: 1)  Ketorolac-Toradol 15mg  [J1885] 2)  Venipuncture [72536] 3)  Specimen Handling [99000] 4)  TLB-CBC Platelet - w/Differential [85025-CBCD] 5)  TLB-Hepatic/Liver Function Pnl [80076-HEPATIC] 6)  TLB-Amylase [82150-AMYL] 7)  TLB-Lipase [83690-LIPASE] 8)  Est. Patient Level IV [64403]

## 2010-11-17 NOTE — Progress Notes (Signed)
Summary: wants relief of uti  Phone Note Call from Patient Call back at Home Phone [phone] Call back at Work Phone [phone]   Caller: Patient---triage vm Summary of Call: c/o an uti, with her urine being the colr of gold. [facility]  please advise. Initial call taken by: Warnell Forester,  October 27, 2010 9:53 AM  Follow-up for Phone Call        Pt c/o dysuria, frequency, urgency, x 24 hours. No fever. Follow-up by: Lynann Beaver CMA AAMA,  October 27, 2010 10:07 AM  Additional Follow-up for Phone Call Additional follow up Details #1::        cipro 250  BID for 5 days Additional Follow-up by: Stacie Glaze MD,  October 27, 2010 1:13 PM    New/Updated Medications: CIPRO 250 MG TABS (CIPROFLOXACIN HCL) one by mouth two times a day x 5 days Prescriptions: CIPRO 250 MG TABS (CIPROFLOXACIN HCL) one by mouth two times a day x 5 days  #10 x 0   Entered by:   Lynann Beaver CMA AAMA   Authorized by:   Stacie Glaze MD   Signed by:   Lynann Beaver CMA AAMA on 10/27/2010   Method used:   [facility]        [facility]  [address] [facility]       [address]       [place], [place]  [address]       Ph: [phone]       Fax: [phone]   RxID:   [id number]  Notified pt.

## 2010-11-17 NOTE — Assessment & Plan Note (Signed)
Summary: abdomen pain/dm   Vital Signs:  Patient profile:   65 year old female Weight:      191 pounds Temp:     98.0 degrees F oral BP sitting:   138 / 84  (left arm)  Vitals Entered By: Sid Falcon LPN (November 05, 2009 9:46 AM) CC: Right side abd pain X 1 week   History of Present Illness: Patient seen as a work in with abdominal pain.  One-week history of somewhat progressive pains right upper quadrant mostly with occasional radiation to her mid epigastric. Described as dull ache. 7/10 in severity at worst. No alleviating factors. Somewhat worse when she lays on right side worse after eating it tends to be worse at night. Some associated nausea. Denies any vomiting, fever, cough, pleuritic pain, dyspnea, or dysuria. Pain radiates occasionally toward the back. Occasional constipation and no diarrhea.  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    Physical Exam  General:  Well-developed,well-nourished,in no acute distress; alert,appropriate and cooperative  throughout examination Mouth:  Oral mucosa and oropharynx without lesions or exudates.  Teeth in good repair. Neck:  No deformities, masses, or tenderness noted. Lungs:  Normal respiratory effort, chest expands symmetrically. Lungs are clear to auscultation, no crackles or wheezes. Heart:  Normal rate and regular rhythm. S1 and S2 normal without gallop, murmur, click, rub or other extra sounds. Abdomen:  soft, normal bowel sounds, no distention, no masses, no guarding, no abdominal hernia, no hepatomegaly, and no splenomegaly.  slightly tender right upper quadrant to deep palpation. Extremities:  no edema Cervical Nodes:  No lymphadenopathy noted   Impression & Recommendations:  Problem # 1:  ABDOMINAL PAIN RIGHT UPPER QUADRANT (ICD-789.01) Assessment New Rule out symptomatic gallstones. Abdominal ultrasound and obtain hepatic panel and CBC. Bland diet until further evaluated. Her updated medication list for this problem includes:    Metoclopramide Hcl 10 Mg Tabs (Metoclopramide hcl) .Marland Kitchen... 1 before meals and at bedtime  Orders: TLB-Hepatic/Liver Function Pnl (80076-HEPATIC) TLB-CBC Platelet - w/Differential (85025-CBCD) Radiology Referral (Radiology)  Complete Medication List: 1)  Pristiq 100 Mg Xr24h-tab (Desvenlafaxine succinate) .... One by mouth daily 2)  Lipitor 10 Mg Tabs (Atorvastatin calcium) .... Once daily 3)  Xenical 120 Mg Caps (Orlistat) .... Once daily 4)  Premarin 0.625 Mg Tabs (Estrogens conjugated) .... Once daily 5)  Flonase 50 Mcg/act Susp (Fluticasone propionate) .... Once daily 6)  Pantoprazole Sodium 40 Mg Tbec (Pantoprazole sodium) .... One by mouth daily 7)  Celebrex 200 Mg Caps (Celecoxib) .... One by mouth daily 8)  Vicoprofen 7.5-200 Mg Tabs (Hydrocodone-ibuprofen) .... Q 6 hrs as needed 9)  Symbicort 80-4.5 Mcg/act Aero (Budesonide-formoterol fumarate) .... 2 puffs once daily 10)  Metoclopramide Hcl 10 Mg Tabs (Metoclopramide hcl) .Marland Kitchen.. 1 before meals and  at bedtime  11)  Alprazolam 0.5 Mg Tbdp (Alprazolam) .... One by mouth three times a day as needed for anxiety 12)  Claritin 10 Mg Caps (Loratadine) .Marland Kitchen.. 1 once daily 13)  Glucosamine-chondroitin 1500-1200 Mg/70ml Liqd (Glucosamine-chondroitin) .Marland Kitchen.. 1 once daily 14)  Fish Oil 1000 Mg Caps (Omega-3 fatty acids) .Marland Kitchen.. 1 once daily 15)  Zinc  16)  Benicar Hct 40-12.5 Mg Tabs (Olmesartan medoxomil-hctz) .Marland Kitchen.. 1 once daily 17)  Doxepin Hcl 10 Mg Caps (Doxepin hcl) .Marland Kitchen.. 1 at bedtime as needed sleep 18)  Zithromax Z-pak 250 Mg Tabs (Azithromycin) .... Take as directed 19)  Atuss Ds 30-4-30 Mg/38ml Susp (Pseudoephed hcl-cpm-dm hbr tan) .... 2 tsp every 12 hours  Patient Instructions: 1)  Avoid Fatty Foods until Abdominal Pain Is Further Evaluated. 2)  Follow up immediately if you notice any fever or severe pain

## 2010-11-17 NOTE — Progress Notes (Signed)
Summary: Gall Bladder US today or tonight.  Phone Note Call from Patient   Caller: Patient Call For: Stacie Glaze MD Summary of Call: Pt. had been vomiting and nauseated all night with severe Right Shoulder pain.  Vicoprofen is not helping at all.  [facility]) [phone]  Initial call taken by: [facility] CMA AAMA,  November 02, 2010 9:36 AM  Follow-up for Phone Call        needs ov with another md-  possible gb? per dr Lovell Sheehan Follow-up by: Willy Eddy, LPN,  November 02, 2010 1:52 PM  Additional Follow-up for Phone Call Additional follow up Details #1::        No more pain meds until GB US is done. Additional Follow-up by: Lynann Beaver CMA AAMA,  November 02, 2010 2:35 PM  New Problems: NAUSEA ALONE (ICD-787.02)   New Problems: NAUSEA ALONE (ICD-787.02)  Appended Document: Gall Bladder US today or tonight.    Clinical Lists Changes  Medications: Added new medication of PROMETHAZINE HCL 25 MG TABS (PROMETHAZINE HCL) one by mouth q 6 hours nausea - Signed Rx of PROMETHAZINE HCL 25 MG TABS (PROMETHAZINE HCL) one by mouth q 6 hours nausea;  #20 x 0;  Signed;  Entered by: Lynann Beaver CMA AAMA;  Authorized by: Stacie Glaze MD;  Method used: [facility] [phone]*, [address], [place], [place], [place]  [address], Ph: [phone], Fax: [phone]    Prescriptions: PROMETHAZINE HCL 25 MG TABS (PROMETHAZINE HCL) one by mouth q 6 hours nausea  #20 x 0   Entered by:   Lynann Beaver CMA AAMA   Authorized by:   Stacie Glaze MD   Signed by:   Lynann Beaver CMA AAMA on 11/02/2010   Method used:   [facility]ronically to        [facility]  [address] [phone]* (retail)       [address]       [place], [place]  [address]       Ph: [phone]       Fax: [phone]   RxID:   [id number]    Appended Document: Orders Update    Clinical Lists Changes  Orders: Added new Referral order of Radiology Referral  (Radiology) - Signed

## 2010-11-17 NOTE — Progress Notes (Signed)
Summary: Pt no longer taking Doxepin and Tramadol. Gives pt headaches  Phone Note Call from Patient Call back at [facility] Phone [phone]   Caller: Patient Summary of Call: Pt wanted Dr. Lovell Sheehan to know that she is no longer taking the last two meds Doxepin and Tramadol. Pt is sending a refill req for other meds that she needs.  Initial call taken by: Lucy Antigua,  Mar 07, 2010 4:22 PM     Appended Document: Orders Update    Clinical Lists Changes  Problems: Added new problem of ALCOHOL ABUSE (ICD-305.00) Orders: Added new Referral order of Misc. Referral (Misc. Ref) - Signed

## 2010-11-17 NOTE — Progress Notes (Signed)
Summary: Pt no longer taking Prestique. Req Imm[facility]ine to [facility]  Phone Note Call from Patient   Caller: Patient Reason for Call: Acute Illness Summary of Call: Pt called and said she is going to stop taking Prestique because it is making pt dizzy and depressed. Pt would like Dr Lovell Sheehan to call in [facility] [address] [phone] Initial call taken by: Lucy Antigua,  March 17, 2010 3:28 PM  Follow-up for Phone Call        ok per dr Lovell Sheehan to change to immipramin 150- pt informed and med called in Follow-up by: Willy Eddy, LPN,  March 18, 4539 3:55 PM    New/Updated Medications: IMIPRAMINE HCL 50 MG TABS (IMIPRAMINE HCL) 3 at bedtime Prescriptions: IMIPRAMINE HCL 50 MG TABS (IMIPRAMINE HCL) 3 at bedtime  #90 x 3   Entered by:   Willy Eddy, LPN   Authorized by:   Stacie Glaze MD   Signed by:   Willy Eddy, LPN on 98/08/9146   Method used:   [facility]        [facility]  [address] [phone]* (retail)       [address]       [place], [place]  [address]       Ph: [phone]       Fax: [phone]   RxID:   [id number]

## 2010-11-17 NOTE — Progress Notes (Signed)
Summary: Lyrica is making her dizzy.  Phone Note Call from Patient   Summary of Call: Pt states Lyrica is making her dizzy, and wants Dr. Lovell Sheehan to know that. Please call with advice. [phone] Initial call taken by: Lynann Beaver CMA,  April 04, 2010 12:14 PM  Follow-up for Phone Call        per dr Lovell Sheehan - cut back to two times a day  Follow-up by: Willy Eddy, LPN,  April 04, 2010 3:01 PM    New/Updated Medications: LYRICA 50 MG CAPS (PREGABALIN) one by mouth two times a day Pt. notified.  Appended Document: Lyrica is making her dizzy. Pt wants Dr. Lovell Sheehan know she is wobbly when she takes the Lyrica........just wants to be sure he knows how severe it is .  Recommended she take it on a full stomach.

## 2010-11-17 NOTE — Progress Notes (Signed)
  Phone Note Call from Patient Call back at Home Phone [phone]   Caller: Spouse Call For: Stacie Glaze MD Summary of Call: Asking for a referral to a neurologist for short term memory loss. Initial call taken by: Lynann Beaver CMA,  June 06, 2010 9:55 AM  Follow-up for Phone Call        may refer hx of etoh Follow-up by: Stacie Glaze MD,  June 07, 2010 9:31 AM

## 2010-11-17 NOTE — Progress Notes (Signed)
  Called and left a message to see if this patient even came to the lab on 11-05-09.  Appended Document:  Had labs done at [facility].

## 2010-11-17 NOTE — Assessment & Plan Note (Signed)
Summary: 1 month rov/njr   Vital Signs:  Patient profile:   65 year old female Height:      67 inches (170.18 cm) Weight:      187.13 pounds (85.06 kg) Temp:     98.6 degrees F (37.00 degrees C) oral Pulse rate:   64 / minute BP sitting:   130 / 84  (left arm) Cuff size:   regular  Vitals Entered By: Josph Macho RMA (April 25, 2010 4:12 PM) CC: 1 month follow up/ 90 day on all refills/pt states she would like a 90 day supply of Claritin 10mg / CF, Hypertension Management Is Patient Diabetic? No   CC:  1 month follow up/ 90 day on all refills/pt states she would like a 90 day supply of Claritin 10mg / CF and Hypertension Management.  History of Present Illness: The pt has been going to the AA meeting she is aware of the problems of having a spouse that still drinks HTN stable mild flair of fibromyalgia requestion pain control risk of pain meds in adiction discusssed I have spent greater that 30 min face to face evaluating this patient   Hypertension History:      She denies headache, chest pain, palpitations, dyspnea with exertion, orthopnea, PND, peripheral edema, visual symptoms, neurologic problems, syncope, and side effects from treatment.        Positive major cardiovascular risk factors include female age 41 years old or older and hypertension.  Negative major cardiovascular risk factors include negative family history for ischemic heart disease and non-tobacco-user status.     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 3 At Bedtime 2)  Lipitor 10 Mg  Tabs (Atorvastatin Calcium) .... Once Daily 3)  Xenical 120 Mg  Caps (Orlistat) .... Once Daily 4)  Premarin 0.625 Mg  Tabs (Estrogens Conjugated) .... Once Daily 5)  Flonase 50 Mcg/act  Susp (Fluticasone Propionate) .... Once Daily 6)  Pantoprazole Sodium 40 Mg Tbec (Pantoprazole Sodium) .... One By Mouth Daily 7)  Celebrex 200 Mg Caps (Celecoxib) .... One By Mouth Daily 8)  Vicoprofen 7.5-200 Mg  Tabs (Hydrocodone-Ibuprofen) .... Q 6 Hrs As Needed  ( 90 Day Supply) 9)  Symbicort 80-4.5 Mcg/act  Aero (Budesonide-Formoterol Fumarate) .... 2 Puffs Once Daily 10)  Metoclopramide Hcl 10 Mg  Tabs (Metoclopramide Hcl) .Marland Kitchen.. 1 Before Meals and At Bedtime 11)  Alprazolam 0.5 Mg Tbdp (Alprazolam) .... One By Mouth Three Times A Day As Needed For Anxiety 12)  Glucosamine-Chondroitin 1500-1200 Mg/65ml Liqd (Glucosamine-Chondroitin) .Marland Kitchen.. 1 Once Daily 13)  Fish Oil 1000 Mg Caps (Omega-3 Fatty  Acids) .Marland Kitchen.. 1 Once Daily 14)  Zinc 15)  Benicar Hct 40-12.5 Mg Tabs (Olmesartan Medoxomil-Hctz) .Marland Kitchen.. 1 Once Daily 16)  Lyrica 50 Mg Caps (Pregabalin) .... One By Mouth Two Times A Day 17)  Vitamin B-1 100 Mg Tabs (Thiamine Hcl) .... One By Mouth Daily 18)  Folbee 2.5-25-1 Mg Tabs (Folic Acid-Vit B6-Vit B12) .... One By Mouth Daily  Current Medications (verified): 1)  Imipramine Hcl 50 Mg Tabs (Imipramine Hcl) .... 3 At Bedtime 2)  Lipitor 10 Mg  Tabs (Atorvastatin Calcium) .... Once  Daily 3)  Premarin 0.625 Mg  Tabs (Estrogens Conjugated) .... Once Daily 4)  Flonase 50 Mcg/act  Susp (Fluticasone Propionate) .... Once Daily 5)  Pantoprazole Sodium 40 Mg Tbec (Pantoprazole Sodium) .... One By Mouth Daily 6)  Celebrex 200 Mg Caps (Celecoxib) .... One By Mouth Daily 7)  Vicoprofen 7.5-200 Mg  Tabs (Hydrocodone-Ibuprofen) .... Q 12 Hrs As Needed  Not To Exceed Two A Day 8)  Symbicort 80-4.5 Mcg/act  Aero (Budesonide-Formoterol Fumarate) .... 2 Puffs Once Daily 9)  Alprazolam 0.5 Mg Tbdp (Alprazolam) .... One By Mouth Three Times A Day As Needed For Anxiety 10)  Glucosamine-Chondroitin 1500-1200 Mg/7ml Liqd (Glucosamine-Chondroitin) .Marland Kitchen.. 1 Once Daily 11)  Fish Oil 1000 Mg Caps (Omega-3 Fatty Acids) .Marland Kitchen.. 1 Once Daily 12)  Zinc 13)  Benicar Hct 40-12.5 Mg Tabs (Olmesartan Medoxomil-Hctz) .Marland Kitchen.. 1 Once Daily 14)  Vitamin B-1 100 Mg Tabs (Thiamine Hcl) .... One By Mouth Daily 15)  Folbee 2.5-25-1 Mg Tabs (Folic Acid-Vit B6-Vit B12) .... One By Mouth Daily 16)  Doxepin Hcl 25 Mg Caps (Doxepin Hcl) .... Once Daily  Allergies (verified): 1)  Pcn 2)  * Latex  Past History:  Family History: Last updated: 05/01/2007 Family History of Alcoholism/Addiction Family History Diabetes 1st degree relative Family History of Cardiovascular disorder  Social History: Last updated: 07/25/2007 Married Never Smoked Alcohol use-yes Drug use-no Regular exercise-no  Risk Factors: Alcohol Use: 4+ (03/25/2010) Exercise: no (07/25/2007)  Risk Factors: Smoking Status: never (03/25/2010) Passive Smoke Exposure: no (03/25/2010)  Past medical, surgical, family and social histories (including risk factors) reviewed, and no changes noted (except as noted below).  Past Medical History: Reviewed history from 10/26/2008 and no changes required. ETOH Allergic rhinitis Diverticulosis, colon Hypertension Depression Osteoarthritis Asthma GERD  Past Surgical History: Reviewed history from  09/27/2007 and no changes required. 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 Family History of Alcoholism/Addiction Family History Diabetes 1st degree relative Family History of Cardiovascular disorder  Social History: Reviewed history from 07/25/2007 and no changes required. Married Never Smoked Alcohol use-yes Drug use-no Regular exercise-no  Review of Systems  The patient denies anorexia, fever, weight loss, weight gain, vision loss, decreased hearing, hoarseness, chest pain, syncope, dyspnea on exertion, peripheral edema, prolonged cough, headaches, hemoptysis, abdominal pain, melena, hematochezia, severe indigestion/heartburn, hematuria, incontinence, genital sores, muscle weakness, suspicious skin lesions, transient blindness, difficulty walking, depression, unusual weight change, abnormal bleeding, enlarged lymph nodes, angioedema, and breast masses.    Physical Exam  General:  Well-developed,well-nourished,in no acute distress; alert,appropriate and cooperative throughout examination Head:  Normocephalic and atraumatic without obvious abnormalities. No apparent alopecia or balding. Eyes:  pupils equal and pupils round.   Ears:  R ear normal and L ear normal.   Nose:  no external deformity and no nasal discharge.  Mouth:  Oral mucosa and oropharynx without lesions or exudates.  Teeth in good repair. Neck:  tender mskfull ROM.   Lungs:  Normal respiratory effort, chest expands symmetrically. Lungs are clear to auscultation, no crackles or wheezes. Heart:  Normal rate and regular rhythm. S1 and S2 normal without gallop, murmur, click, rub or other extra sounds. Abdomen:  soft, normal bowel sounds, no distention, no masses, no guarding, no abdominal hernia, no hepatomegaly,  and no splenomegaly.  slightly tender right upper quadrant to deep palpation. Msk:  lumbar lordosis, SI joint tenderness, and trigger point tenderness.   Pulses:  R and L carotid,radial,femoral,dorsalis pedis and posterior tibial pulses are full and equal bilaterally Extremities:  trace left pedal edema and trace right pedal edema.     Impression & Recommendations:  Problem # 1:  ALCOHOL ABUSE (ICD-305.00) continued in the program  Problem # 2:  DEPRESSION, CHRONIC (ICD-311)  Her updated medication list for this problem includes:    Imipramine Hcl 50 Mg Tabs (Imipramine hcl) .Marland KitchenMarland KitchenMarland KitchenMarland Kitchen 3 at bedtime    Alprazolam 0.5 Mg Tbdp (Alprazolam) ..... One by mouth three times a day as needed for anxiety    Doxepin Hcl 25 Mg Caps (Doxepin hcl) ..... Once daily  Discussed treatment options, including trial of antidpressant medication. Will refer to behavioral health. Follow-up call in in 24-48 hours and recheck in 2 weeks, sooner as needed. Patient agrees to call if any worsening of symptoms or thoughts of doing harm arise. Verified that the patient has no suicidal ideation at this time.   Problem # 3:  OBSTRUCTIVE CHRONIC BRONCHITIS WITH EXACERBATION (ICD-491.21) the heat has effected her  but is stable  Problem # 4:  OSTEOARTHRITIS (ICD-715.90) pain control is better with walking Her updated medication list for this problem includes:    Celebrex 200 Mg Caps (Celecoxib) ..... One by mouth daily    Vicoprofen 7.5-200 Mg Tabs (Hydrocodone-ibuprofen) ..... Q 12 hrs as needed  not to exceed two a day  Discussed use of medications, application of heat or cold, and exercises.   Problem # 5:  FIBROMYALGIA (ICD-729.1)  Her updated medication list for this problem includes:    Celebrex 200 Mg Caps (Celecoxib) ..... One by mouth daily    Vicoprofen 7.5-200 Mg Tabs (Hydrocodone-ibuprofen) ..... Q 12 hrs as needed  not to exceed two a day  Complete Medication List: 1)  Imipramine Hcl 50 Mg Tabs  (Imipramine hcl) .... 3 at bedtime 2)  Lipitor 10 Mg Tabs (Atorvastatin calcium) .... Once daily 3)  Premarin 0.625 Mg Tabs (Estrogens conjugated) .... Once daily 4)  Flonase 50 Mcg/act Susp (Fluticasone propionate) .... Once daily 5)  Pantoprazole Sodium 40 Mg Tbec (Pantoprazole sodium) .... One by mouth daily 6)  Celebrex 200 Mg Caps (Celecoxib) .... One by mouth daily 7)  Vicoprofen 7.5-200 Mg Tabs (Hydrocodone-ibuprofen) .... Q 12 hrs as needed  not to exceed two a day 8)  Symbicort 80-4.5 Mcg/act Aero (Budesonide-formoterol fumarate) .... 2 puffs once daily 9)  Alprazolam 0.5 Mg Tbdp (Alprazolam) .... One by mouth three times a day as needed for anxiety 10)  Glucosamine-chondroitin 1500-1200 Mg/38ml Liqd (Glucosamine-chondroitin) .Marland Kitchen.. 1 once daily 11)  Fish Oil 1000 Mg Caps (Omega-3 fatty acids) .Marland Kitchen.. 1 once daily 12)  Zinc  13)  Benicar Hct 40-12.5 Mg Tabs (Olmesartan medoxomil-hctz) .Marland Kitchen.. 1 once daily 14)  Vitamin B-1 100 Mg Tabs (Thiamine hcl) .... One by mouth daily 15)  Folbee 2.5-25-1 Mg Tabs (Folic acid-vit b6-vit b12) .Marland KitchenMarland KitchenMarland Kitchen  One by mouth daily 16)  Doxepin Hcl 25 Mg Caps (Doxepin hcl) .... Once daily 17)  Claritin 10 Mg Tabs (Loratadine) .Marland Kitchen.. 1 once daily  Hypertension Assessment/Plan:      The patient's hypertensive risk group is category B: At least one risk factor (excluding diabetes) with no target organ damage.  Her calculated 10 year risk of coronary heart disease is 7 %.  Today's blood pressure is 130/84.  Her blood pressure goal is < 140/90.  Patient Instructions: 1)  Please schedule a follow-up appointment in 3 months. Prescriptions: VICOPROFEN 7.5-200 MG  TABS (HYDROCODONE-IBUPROFEN) q 12 hrs as needed  not to exceed two a day  #45 x 2   Entered and Authorized by:   Stacie Glaze MD   Signed by:   Stacie Glaze MD on 04/25/2010   Method used:   Print then Give to Patient   RxID:   [id number]

## 2010-11-17 NOTE — Progress Notes (Signed)
Summary: refill  Phone Note Refill Request Call back at Home Phone [phone] Message from:  Patient---live call  Refills Requested: Medication #1:  VICOPROFEN 7.5-200 MG  TABS q 6 hrs as needed  ( 90 day supply) Daughter took her meds to [facility] and told her that she did not need her meds. Call [facility]. Pt can explain in detail. Please return call.  Initial call taken by: Warnell Forester,  Mar 03, 2010 1:22 PM    New/Updated Medications: TRAMADOL HCL 50 MG TABS (TRAMADOL HCL) 1 EVERY 4-6 HOURS as needed PAIN Prescriptions: TRAMADOL HCL 50 MG TABS (TRAMADOL HCL) 1 EVERY 4-6 HOURS as needed PAIN  #30 x 1   Entered by:   Willy Eddy, LPN   Authorized by:   Stacie Glaze MD   Signed by:   Willy Eddy, LPN on 36/64/4034   Method used:   [facility]        [facility]  [address] [phone][facility] (retail)       [address]       [place], [place]  [address]       Ph: [phone]       Fax: [phone]   RxID:   [id number]   Appended Document: refill PT INFORMED THAT SHE NEEDS A LETTER FROM THERAPIST STATING SHE CAN TAKE NARCOTIC PER DR Cyndra Feinberg- OR WE CAN TRY TRAMADOL 50 PER DR Devinn Hurwitz- POT WANTS TO TRY TRAMADOL. SENT IN

## 2010-11-17 NOTE — Consult Note (Signed)
Summary: [facility]  [facility]   Imported By: Maryln Gottron 04/01/2010 10:37:18  _____________________________________________________________________  External Attachment:    Type:   Image     Comment:   External Document

## 2010-11-17 NOTE — Assessment & Plan Note (Signed)
Summary: 3 month rov/njr   Vital Signs:  Patient profile:   65 year old female Height:      67 inches Weight:      188 pounds BMI:     29.55 Temp:     98.2 degrees F oral Pulse rate:   76 / minute Resp:     14 per minute BP sitting:   130 / 80  (left arm)  Vitals Entered By: Willy Eddy, LPN (March 25, 2010 3:55 PM)  Nutrition Counseling: Patient's BMI is greater than 25 and therefore counseled on weight management options. CC: roa- to discuss appointment with dr ringer--wants vicoprofen and xanax refilled, Hypertension Management   CC:  roa- to discuss appointment with dr ringer--wants vicoprofen and xanax refilled and Hypertension Management.  History of Present Illness: discussion of recent stay in rehab and the implications of pain medication use for her fibromyalgia pain she states that she has pain in hips, knees shoulders and neck as well as elbows and rates the functioning imact as  2//3 on three levels for a high fibromyalgia index of severity  Hypertension History:      She denies headache, chest pain, palpitations, dyspnea with exertion, orthopnea, PND, peripheral edema, visual symptoms, neurologic problems, syncope, and side effects from treatment.        Positive major cardiovascular risk factors include female age 16 years old or older and hypertension.  Negative major cardiovascular risk factors include negative family history for ischemic heart disease and non-tobacco-user status.     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) 11)  Preoperative Examination  (ICD-V72.84) 12)  Gerd  (ICD-530.81) 13)  Osteoarthritis  (ICD-715.90) 14)  Panic Attack  (ICD-300.01) 15)  Family History Diabetes 1st Degree Relative  (ICD-V18.0) 16)  Family History of Alcoholism/addiction  (ICD-V61.41) 17)  Depression  (ICD-311) 18)  Asthma  (ICD-493.90) 19)  Hypertension  (ICD-401.9) 20)  Diverticulosis, Colon  (ICD-562.10) 21)  Allergic Rhinitis  (ICD-477.9) 22)  Menopausal Disorder  (ICD-627.9) 23)  Fibromyalgia  (ICD-729.1)  Current Problems (verified): 1)  Alcohol Abuse  (ICD-305.00) 2)  Abdominal Pain Right Upper Quadrant  (ICD-789.01) 3)  Acute Bronchitis  (ICD-466.0) 4)  Chest Pain  (ICD-786.50) 5)  Depression, Chronic  (ICD-311) 6)  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) 7)  Obstructive Chronic Bronchitis With Exacerbation  (ICD-491.21) 8)  Glucose Intolerance  (ICD-271.3) 9)  Acute Cystitis  (ICD-595.0) 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 3 At Bedtime 2)  Lipitor 10 Mg  Tabs (Atorvastatin Calcium) .... Once Daily 3)  Xenical 120 Mg  Caps (Orlistat) .... Once Daily 4)  Premarin 0.625 Mg  Tabs (Estrogens Conjugated) .... Once Daily 5)  Flonase 50 Mcg/act  Susp (Fluticasone Propionate) .... Once Daily 6)  Pantoprazole Sodium 40 Mg Tbec (Pantoprazole  Sodium) .... One By Mouth Daily 7)  Celebrex 200 Mg Caps (Celecoxib) .... One By Mouth Daily 8)  Vicoprofen 7.5-200 Mg  Tabs (Hydrocodone-Ibuprofen) .... Q 6 Hrs As Needed  ( 90 Day Supply) 9)  Symbicort 80-4.5 Mcg/act  Aero (Budesonide-Formoterol Fumarate) .... 2 Puffs Once Daily 10)  Metoclopramide Hcl 10 Mg  Tabs (Metoclopramide Hcl) .Marland Kitchen.. 1 Before Meals and At Bedtime 11)  Alprazolam 0.5 Mg Tbdp (Alprazolam) .... One By Mouth Three Times A Day As Needed For Anxiety 12)  Glucosamine-Chondroitin 1500-1200 Mg/34ml Liqd (Glucosamine-Chondroitin) .Marland Kitchen.. 1 Once Daily 13)  Fish Oil 1000 Mg Caps (Omega-3 Fatty Acids) .Marland Kitchen.. 1 Once Daily 14)  Zinc 15)  Benicar Hct 40-12.5 Mg Tabs (Olmesartan Medoxomil-Hctz) .Marland Kitchen.. 1 Once Daily 16)  Doxepin Hcl 25 Mg Caps (Doxepin Hcl) .... One By Mouth Q Hs 17)  Antabuse 250 Mg Tabs (Disulfiram) .Marland Kitchen.. 1 Once Daily 18)  Tramadol Hcl 50 Mg Tabs (Tramadol Hcl) .Marland Kitchen.. 1 Every 4-6 Hours As Needed Pain  Current Medications (verified): 1)  Imipramine Hcl 50 Mg Tabs (Imipramine Hcl) .... 3 At Bedtime 2)  Lipitor 10 Mg  Tabs (Atorvastatin Calcium) .... Once Daily 3)  Xenical 120 Mg  Caps (Orlistat) .... Once Daily 4)  Premarin 0.625 Mg  Tabs (Estrogens Conjugated) .... Once Daily 5)  Flonase 50 Mcg/act  Susp (Fluticasone Propionate) .... Once Daily 6)  Pantoprazole Sodium 40 Mg Tbec (Pantoprazole Sodium) .... One By Mouth Daily 7)  Celebrex 200 Mg Caps (Celecoxib) .... One By Mouth Daily 8)  Vicoprofen 7.5-200 Mg  Tabs (Hydrocodone-Ibuprofen) .... Q 6 Hrs As Needed  ( 90 Day Supply) 9)  Symbicort 80-4.5 Mcg/act  Aero (Budesonide-Formoterol Fumarate) .... 2 Puffs Once Daily 10)  Metoclopramide Hcl 10 Mg  Tabs (Metoclopramide Hcl) .Marland Kitchen.. 1 Before Meals and At Bedtime 11)  Alprazolam 0.5 Mg Tbdp (Alprazolam) .... One By Mouth Three Times A Day As Needed For Anxiety 12)  Glucosamine-Chondroitin 1500-1200 Mg/35ml Liqd (Glucosamine-Chondroitin) .Marland Kitchen.. 1 Once Daily 13)  Fish Oil 1000 Mg  Caps (Omega-3 Fatty Acids) .Marland Kitchen.. 1 Once Daily 14)  Zinc 15)  Benicar Hct 40-12.5 Mg Tabs (Olmesartan Medoxomil-Hctz) .Marland Kitchen.. 1 Once Daily 16)  Lyrica 50 Mg Caps (Pregabalin) .... One By Mouth Three Times A Day 17)  Vitamin B-1 100 Mg Tabs (Thiamine Hcl) .... One By Mouth Daily 18)  Folbee 2.5-25-1 Mg Tabs (Folic Acid-Vit B6-Vit B12) .... One By Mouth Daily  Allergies (verified): 1)  Pcn 2)  * Latex  Past History:  Family History: Last updated: 05/01/2007 Family History of Alcoholism/Addiction Family History Diabetes 1st degree relative Family History of Cardiovascular disorder  Social History: Last updated: 07/25/2007 Married Never Smoked Alcohol use-yes Drug use-no Regular exercise-no  Risk Factors: Alcohol Use: 4+ (03/25/2010) Exercise: no (07/25/2007)  Risk Factors: Smoking Status: never (03/25/2010) Passive Smoke Exposure: no (03/25/2010)  Past medical, surgical, family and social histories (including risk factors) reviewed, and no changes noted (except as noted below).  Past Medical History: Reviewed history from 10/26/2008 and no changes required. ETOH Allergic rhinitis Diverticulosis, colon Hypertension Depression Osteoarthritis Asthma GERD  Past Surgical History: Reviewed  history from 09/27/2007 and no changes required. 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 Family History of Alcoholism/Addiction Family History Diabetes 1st degree relative Family History of Cardiovascular disorder  Social History: Reviewed history from 07/25/2007 and no changes required. Married Never Smoked Alcohol use-yes Drug use-no Regular exercise-no  Review of Systems       The patient complains of peripheral edema, headaches, muscle weakness, difficulty walking,  and depression.  The patient denies anorexia, fever, weight loss, weight gain, vision loss, decreased hearing, hoarseness, chest pain, syncope, dyspnea on exertion, prolonged cough, hemoptysis, abdominal pain, melena, hematochezia, severe indigestion/heartburn, hematuria, incontinence, genital sores, suspicious skin lesions, transient blindness, unusual weight change, abnormal bleeding, enlarged lymph nodes, angioedema, and breast masses.    Physical Exam  General:  Well-developed,well-nourished,in no acute distress; alert,appropriate and cooperative throughout examination Head:  Normocephalic and atraumatic without obvious abnormalities. No apparent alopecia or balding. Eyes:  pupils equal and pupils round.   Ears:  R ear normal and L ear normal.   Neck:  tender mskfull ROM.   Chest Wall:  No deformities, masses,point tenderness detected Lungs:  Normal respiratory effort, chest expands symmetrically. Lungs are clear to auscultation, no crackles or wheezes. Heart:  Normal rate and regular rhythm. S1 and S2 normal without gallop, murmur, click, rub or other extra sounds. Abdomen:  soft, normal bowel sounds, no distention, no masses, no guarding, no abdominal hernia, no hepatomegaly, and no splenomegaly.  slightly tender right upper quadrant to deep palpation. Msk:  lumbar lordosis, SI joint tenderness, and trigger point tenderness.   Pulses:  R and L carotid,radial,femoral,dorsalis pedis and posterior tibial pulses are full and equal bilaterally Extremities:  trace left pedal edema and trace right pedal edema.   Neurologic:  alert & oriented X3 and cranial nerves II-XII intact.     Impression & Recommendations:  Problem # 1:  ALCOHOL ABUSE (ICD-305.00) Assessment Unchanged thiamin and b vit replacement hold the anabuse continue going to weekly meetings I have spent greater that 30 min face to face evaluating this patient  Problem # 2:  FIBROMYALGIA (ICD-729.1) Assessment:  Deteriorated lyrica 50 three times a day trial The following medications were removed from the medication list:    Tramadol Hcl 50 Mg Tabs (Tramadol hcl) .Marland Kitchen... 1 every 4-6 hours as needed pain Her updated medication list for this problem includes:    Celebrex 200 Mg Caps (Celecoxib) ..... One by mouth daily    Vicoprofen 7.5-200 Mg Tabs (Hydrocodone-ibuprofen) ..... Q 6 hrs as needed  ( 90 day supply)  Complete Medication List: 1)  Imipramine Hcl 50 Mg Tabs (Imipramine hcl) .... 3 at bedtime 2)  Lipitor 10 Mg Tabs (Atorvastatin calcium) .... Once daily 3)  Xenical 120 Mg Caps (Orlistat) .... Once daily 4)  Premarin 0.625 Mg Tabs (Estrogens conjugated) .... Once daily 5)  Flonase 50 Mcg/act Susp (Fluticasone propionate) .... Once daily 6)  Pantoprazole Sodium 40 Mg Tbec (Pantoprazole sodium) .... One by mouth daily 7)  Celebrex 200 Mg Caps (Celecoxib) .... One by mouth daily 8)  Vicoprofen 7.5-200 Mg Tabs (Hydrocodone-ibuprofen) .... Q 6 hrs as needed  ( 90 day supply) 9)  Symbicort 80-4.5 Mcg/act Aero (Budesonide-formoterol fumarate) .... 2 puffs once daily 10)  Metoclopramide Hcl 10 Mg Tabs (Metoclopramide hcl) .Marland Kitchen.. 1 before meals and at bedtime 11)  Alprazolam  0.5 Mg Tbdp (Alprazolam) .... One by mouth three times a day as needed for anxiety 12)  Glucosamine-chondroitin 1500-1200 Mg/38ml Liqd (Glucosamine-chondroitin) .Marland Kitchen.. 1 once daily 13)  Fish Oil 1000 Mg Caps (Omega-3 fatty acids) .Marland Kitchen.. 1 once daily 14)  Zinc  15)  Benicar Hct 40-12.5 Mg Tabs (Olmesartan medoxomil-hctz) .Marland Kitchen.. 1 once daily 16)  Lyrica 50 Mg Caps (Pregabalin) .... One by mouth two times a day 17)  Vitamin B-1 100 Mg Tabs (Thiamine hcl) .... One by mouth daily 18)  Folbee 2.5-25-1 Mg Tabs (Folic acid-vit b6-vit b12) .... One by mouth daily  Hypertension Assessment/Plan:      The patient's hypertensive risk group is category B: At least one risk factor (excluding diabetes) with no target organ damage.  Her calculated 10  year risk of coronary heart disease is 7 %.  Today's blood pressure is 130/80.  Her blood pressure goal is < 140/90.  Patient Instructions: 1)  Please schedule a follow-up appointment in 1 month. Prescriptions: FOLBEE 2.5-25-1 MG TABS (FOLIC ACID-VIT B6-VIT B12) one by mouth daily  #30 x 11   Entered and Authorized by:   Stacie Glaze MD   Signed by:   Stacie Glaze MD on 03/25/2010   Method used:   Print then Give to Patient   RxID:   [id number] LYRICA 50 MG CAPS (PREGABALIN) one by mouth three times a day  #90 x 2   Entered and Authorized by:   Stacie Glaze MD   Signed by:   Stacie Glaze MD on 03/25/2010   Method used:   Print then Give to Patient   RxID:   [id number]

## 2010-11-17 NOTE — Assessment & Plan Note (Signed)
Summary: 3 mo rov/mm/pt rescd from bump//ccm/pt rsc/cjr   Vital Signs:  Patient profile:   65 year old female Height:      67 inches Weight:      190 pounds BMI:     29.87 Temp:     98.2 degrees F oral Pulse rate:   76 / minute Resp:     14 per minute BP sitting:   140 / 80  (left arm)  Vitals Entered By: Willy Eddy, LPN (December 24, 2009 3:05 PM) CC: roa- c/o gi virus since wednesday with nausea , myalgia-requesting vicoprofen refill- [facility] , Hypertension Management   CC:  roa- c/o gi virus since wednesday with nausea , myalgia-requesting vicoprofen refill- [facility]ED [facility] AND [facility] NOT [facility]ILLED , and Hypertension Management.  History of Present Illness: not feeling well with body aches, increased congestion nausea, has been at daughter for two weeks she is tear full and attributes this simply to not feeling well she has not been out of any medications,  has been fighting with daughter about her health this may be the key issue  Hypertension History:      She denies headache, chest pain, palpitations, dyspnea with exertion, orthopnea, PND, peripheral edema, visual symptoms, neurologic problems, syncope, and side effects from treatment.        Positive major cardiovascular risk factors include female age 72 years old or older and hypertension.  Negative major cardiovascular risk factors include negative family history for ischemic heart disease and non-tobacco-user status.     Preventive Screening-Counseling & Management  Alcohol-Tobacco     Alcohol drinks/day: 4+     Smoking Status: never     Passive Smoke Exposure: no  Current Medications (verified): 1)  Pristiq 100 Mg Xr24h-Tab (Desvenlafaxine Succinate) .... One By Mouth Daily 2)  Lipitor 10 Mg  Tabs (Atorvastatin Calcium) .... Once Daily 3)  Xenical 120 Mg  Caps (Orlistat) .... Once Daily 4)  Premarin 0.625 Mg  Tabs (Estrogens Conjugated) .... Once Daily 5)   Flonase 50 Mcg/act  Susp (Fluticasone Propionate) .... Once Daily 6)  Pantoprazole Sodium 40 Mg Tbec (Pantoprazole Sodium) .... One By Mouth Daily 7)  Celebrex 200 Mg Caps (Celecoxib) .... One By Mouth Daily 8)  Vicoprofen 7.5-200 Mg  Tabs (Hydrocodone-Ibuprofen) .... Q 6 Hrs As Needed 9)  Symbicort 80-4.5 Mcg/act  Aero (Budesonide-Formoterol Fumarate) .... 2 Puffs Once Daily 10)  Metoclopramide Hcl 10 Mg  Tabs (Metoclopramide Hcl) .Marland Kitchen.. 1 Before Meals and At Bedtime 11)  Alprazolam 0.5 Mg Tbdp (Alprazolam) .... One By Mouth Three Times A Day As Needed For Anxiety 12)  Claritin 10 Mg Caps (Loratadine) .Marland Kitchen.. 1 Once Daily 13)  Glucosamine-Chondroitin 1500-1200 Mg/33ml Liqd (Glucosamine-Chondroitin) .Marland Kitchen.. 1 Once Daily 14)  Fish Oil 1000 Mg Caps (Omega-3 Fatty Acids) .Marland Kitchen.. 1 Once Daily 15)  Zinc 16)  Benicar Hct 40-12.5 Mg Tabs (Olmesartan Medoxomil-Hctz) .Marland Kitchen.. 1 Once Daily 17)  Doxepin Hcl 10 Mg Caps (Doxepin Hcl) .Marland Kitchen.. 1 At Bedtime As Needed Sleep 18)  Zithromax Z-Pak 250 Mg Tabs (Azithromycin) .... Take As Directed 19)  Atuss Ds 30-4-30 Mg/65ml Susp (Pseudoephed Hcl-Cpm-Dm Hbr Tan) .... 2 Tsp Every 12 Hours  Allergies (verified): 1)  Pcn 2)  * Latex  Past History:  Family History: Last updated: 05/01/2007 Family History of Alcoholism/Addiction Family History Diabetes 1st degree relative Family History of Cardiovascular disorder  Social History: Last updated: 07/25/2007 Married Never Smoked Alcohol use-yes Drug  use-no Regular exercise-no  Risk Factors: Alcohol Use: 4+ (12/24/2009) Exercise: no (07/25/2007)  Risk Factors: Smoking Status: never (12/24/2009) Passive Smoke Exposure: no (12/24/2009)  Past medical, surgical, family and social histories (including risk factors) reviewed, and no changes noted (except as noted below).  Past Medical History: Reviewed history from 10/26/2008 and no changes required. ETOH Allergic rhinitis Diverticulosis,  colon Hypertension Depression Osteoarthritis Asthma GERD  Past Surgical History: Reviewed history from 09/27/2007 and no changes required. 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 Family History of Alcoholism/Addiction Family History Diabetes 1st degree relative Family History of Cardiovascular disorder  Social History: Reviewed history from 07/25/2007 and no changes required. Married Never Smoked Alcohol use-yes Drug use-no Regular exercise-no  Review of Systems  The patient denies anorexia, fever, weight loss, weight gain, vision loss, decreased hearing, hoarseness, chest pain, syncope, dyspnea on exertion, peripheral edema, prolonged cough, headaches, hemoptysis, abdominal pain, melena, hematochezia, severe indigestion/heartburn, hematuria, incontinence, genital sores, muscle weakness, suspicious skin lesions, transient blindness, difficulty walking, depression, unusual weight change, abnormal bleeding, enlarged lymph nodes, angioedema, and breast masses.    Physical Exam  General:  Well-developed,well-nourished,in no acute distress; alert,appropriate and cooperative throughout examination Head:  Normocephalic and atraumatic without obvious abnormalities. No apparent alopecia or balding. Eyes:  pupils equal and pupils round.   Ears:  R ear normal and L ear normal.   Nose:  no external deformity and no nasal discharge.   Mouth:  Oral mucosa and oropharynx without lesions or exudates.  Teeth in good repair. Neck:  No deformities, masses, or tenderness noted. Lungs:  Normal respiratory effort, chest expands symmetrically. Lungs are clear to auscultation, no crackles or wheezes.   Impression & Recommendations:  Problem # 1:  DEPRESSION, CHRONIC (ICD-311)  Her updated  medication list for this problem includes:    Pristiq 100 Mg Xr24h-tab (Desvenlafaxine succinate) ..... One by mouth daily    Alprazolam 0.5 Mg Tbdp (Alprazolam) ..... One by mouth three times a day as needed for anxiety    Doxepin Hcl 25 Mg Caps (Doxepin hcl) ..... One by mouth q hs  Discussed treatment options, including trial of antidpressant medication. Will refer to behavioral health. Follow-up call in in 24-48 hours and recheck in 2 weeks, sooner as needed. Patient agrees to call if any worsening of symptoms or thoughts of doing harm arise. Verified that the patient has no suicidal ideation at this time.   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 2 puffs once daily  Problem # 5:  FIBROMYALGIA (ICD-729.1)  Her updated medication list for this problem includes:    Celebrex 200 Mg Caps (Celecoxib) ..... One by mouth daily    Vicoprofen 7.5-200 Mg Tabs (Hydrocodone-ibuprofen) ..... Q 6 hrs as needed  ( 90 day supply)  Problem # 6:  HYPERTENSION (ICD-401.9)  Her updated medication list for this problem includes:    Benicar Hct 40-12.5 Mg Tabs (Olmesartan medoxomil-hctz) .Marland Kitchen... 1 once daily  BP today: 140/80 Prior BP: 138/84 (11/05/2009)  Prior 10 Yr Risk Heart Disease: 7 % (  10/26/2008)  Labs Reviewed: K+: 4.4 (09/14/2008) Creat: : 1.0 (09/14/2008)   Chol: 140 (01/03/2007)   HDL: 58.4 (01/03/2007)   LDL: 59 (01/03/2007)   TG: 112 (01/03/2007)  Complete Medication List: 1)  Pristiq 100 Mg Xr24h-tab (Desvenlafaxine succinate) .... One by mouth daily 2)  Lipitor 10 Mg Tabs (Atorvastatin calcium) .... Once daily 3)  Xenical 120 Mg Caps (Orlistat) .... Once  daily 4)  Premarin 0.625 Mg Tabs (Estrogens conjugated) .... Once daily 5)  Flonase 50 Mcg/act Susp (Fluticasone propionate) .... Once daily 6)  Pantoprazole Sodium 40 Mg Tbec (Pantoprazole sodium) .... One by mouth daily 7)  Celebrex 200 Mg Caps (Celecoxib) .... One by mouth daily 8)  Vicoprofen 7.5-200 Mg Tabs (Hydrocodone-ibuprofen) .... Q 6 hrs as needed  ( 90 day supply) 9)  Symbicort 80-4.5 Mcg/act Aero (Budesonide-formoterol fumarate) .... 2 puffs once daily 10)  Metoclopramide Hcl 10 Mg Tabs (Metoclopramide hcl) .Marland Kitchen.. 1 before meals and at bedtime 11)  Alprazolam 0.5 Mg Tbdp (Alprazolam) .... One by mouth three times a day as needed for anxiety 12)  Glucosamine-chondroitin 1500-1200 Mg/62ml Liqd (Glucosamine-chondroitin) .Marland Kitchen.. 1 once daily 13)  Fish Oil 1000 Mg Caps (Omega-3 fatty acids) .Marland Kitchen.. 1 once daily 14)  Zinc  15)  Benicar Hct 40-12.5 Mg Tabs (Olmesartan medoxomil-hctz) .Marland Kitchen.. 1 once daily 16)  Doxepin Hcl 25 Mg Caps (Doxepin hcl) .... One by mouth q hs 17)  Levaquin 500 Mg Tabs (Levofloxacin) .... One by mouth daily for 7 days  Hypertension Assessment/Plan:      The patient's hypertensive risk group is category B: At least one risk factor (excluding diabetes) with no target organ damage.  Her calculated 10 year risk of coronary heart disease is 9 %.  Today's blood pressure is 140/80.  Her blood pressure goal is < 140/90.  Patient Instructions: 1)  Please schedule a follow-up appointment in 3 months. Prescriptions: VICOPROFEN 7.5-200 MG  TABS (HYDROCODONE-IBUPROFEN) q 6 hrs as needed  ( 90 day supply)  #270 x 0   Entered and Authorized by:   Stacie Glaze MD   Signed by:   Stacie Glaze MD on 12/24/2009   Method used:   Print then Give to Patient   RxID:   [id number] LEVAQUIN 500 MG TABS (LEVOFLOXACIN) one by mouth daily for 7 days  #14 x 0   Entered and Authorized by:   Stacie Glaze MD   Signed by:   Stacie Glaze MD on 12/24/2009   Method used:   [facility]         [facility]  [address] [phone][facility]       [address]       [place], [place]  [address]       Ph: [phone]       Fax: [phone]   RxID:   [id number] DOXEPIN HCL 25 MG CAPS (DOXEPIN HCL) one by mouth q HS  #30 x 11   Entered and Authorized by:   Stacie Glaze MD   Signed by:   Stacie Glaze MD on 12/24/2009   Method used:   [facility]        [facility]  [address] [phone][facility] (retail)       [address]       [place], [place]  [address]       Ph: [phone]  Fax: [phone]   RxID:   [id number]   Appended Document: 3 mo rov/mm/pt rescd from bump//ccm/pt rsc/cjr changed levaquin amount to #7

## 2010-11-17 NOTE — Progress Notes (Signed)
Summary: depression  Phone Note Call from Patient Call back at Home Phone [phone]   Caller: Patient Call For: Stacie Glaze MD Summary of Call: Pt is complaining of her depression being really bad in the am........Marland KitchenWants RX for this.......not [facility].  [facility]   Initial call taken by: Lynann Beaver CMA AAMA,  August 19, 2010 10:41 AM  Follow-up for Phone Call        per dr Ival Bible celexa 20 once daily and stop pristiqu and ov no 11/17 as follow up Follow-up by: Willy Eddy, LPN,  August 19, 2010 12:48 PM    New/Updated Medications: CITALOPRAM HYDROBROMIDE 20 MG TABS (CITALOPRAM HYDROBROMIDE) 1 once daily Prescriptions: CITALOPRAM HYDROBROMIDE 20 MG TABS (CITALOPRAM HYDROBROMIDE) 1 once daily  #30 x 6   Entered by:   Willy Eddy, LPN   Authorized by:   Stacie Glaze MD   Signed by:   Willy Eddy, LPN on 09/81/1914   Method used:   [facility]        [facility]  [address] [phone]* (retail)       [address]       [place], [place]  [address]       Ph: [phone]       Fax: [phone]   RxID:   [id number]

## 2010-11-17 NOTE — Progress Notes (Signed)
Summary: REFILLS  Phone Note Call from Patient Call back at Home Phone [phone]   Caller: Patient Call For: Stacie Glaze MD Reason for Call: Acute Illness Complaint: Urinary/GYN Problems Summary of Call: PT STATED  HER KID TOOK HER VICOPROFEN 7.5-200MG  AND ALPRAZOLAM 0.5MG  FROM HER HOUSE.PLEASE CALL [facility] [phone] Initial call taken by: Heron Sabins,  Feb 21, 2010 12:49 PM  Follow-up for Phone Call        pt's husband informed per dr Lovell Sheehan- that he cant give any controlled meds without an ok from her psychiastrist Follow-up by: Willy Eddy, LPN,  Feb 22, 1323 1:36 PM

## 2010-11-17 NOTE — Letter (Signed)
Summary: [facility]  [facility]SE   Imported By: Maryln Gottron 06/28/2010 12:27:24  _____________________________________________________________________  External Attachment:    Type:   Image     Comment:   External Document

## 2010-11-17 NOTE — Progress Notes (Signed)
Summary: Pt found her Alprazolam. Spouse put it in medicine cabinet  Phone Note Call from Patient   Caller: Patient Summary of Call: Pt called and said that she has found her Alprazolam, her spouse put it in medicine cabinet. No need to call in. Pt has plenty.    Initial call taken by: Lucy Antigua,  July 12, 2010 11:46 AM

## 2010-11-17 NOTE — Progress Notes (Signed)
----   Converted from flag ---- ---- 11/03/2010 5:50 PM, Edwyna Perfect MD wrote: labs nl ------------------------------  Phone Note Outgoing Call   Call placed by: Kyung Rudd, CMA,  November 04, 2010 9:25 AM Call placed to: Patient Details for Reason: lab results Summary of Call: pt's husband, Raahi Korber, is aware Initial call taken by: Kyung Rudd, CMA,  November 04, 2010 9:25 AM

## 2010-11-17 NOTE — Assessment & Plan Note (Signed)
Summary: 2 WK F/U PER DR BURCHETTE // RS   Vital Signs:  Patient profile:   65 year old female Height:      67 inches Weight:      194 pounds BMI:     30.49 Temp:     98.2 degrees F oral Pulse rate:   68 / minute Resp:     14 per minute BP sitting:   130 / 80  (left arm)  Vitals Entered By: Willy Eddy, LPN (July 13, 2010 4:11 PM) CC: roa, Hypertension Management Is Patient Diabetic? No   Primary Care Provider:  Stacie Glaze MD  CC:  roa and Hypertension Management.  History of Present Illness: Asthma stable not wheezing reported continues on inhailer without need for reque drugs memory improved with thiamine discussed the refferal to neurologist blood pressure stable GERD stable   Hypertension History:      She denies headache, chest pain, palpitations, dyspnea with exertion, orthopnea, PND, peripheral edema, visual symptoms, neurologic problems, syncope, and side effects from treatment.        Positive major cardiovascular risk factors include female age 38 years old or older and hypertension.  Negative major cardiovascular risk factors include negative family history for ischemic heart disease and non-tobacco-user status.     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) 12)  Preoperative Examination  (ICD-V72.84) 13)  Gerd  (ICD-530.81) 14)  Osteoarthritis  (ICD-715.90) 15)  Panic Attack   (ICD-300.01) 16)  Family History Diabetes 1st Degree Relative  (ICD-V18.0) 17)  Family History of Alcoholism/addiction  (ICD-V61.41) 18)  Depression  (ICD-311) 19)  Asthma  (ICD-493.90) 20)  Hypertension  (ICD-401.9) 21)  Diverticulosis, Colon  (ICD-562.10) 22)  Allergic Rhinitis  (ICD-477.9) 23)  Menopausal Disorder  (ICD-627.9) 24)  Fibromyalgia  (ICD-729.1)  Current Problems (verified): 1)  Memory Loss  (ICD-780.93) 2)  Alcohol Abuse  (ICD-305.00) 3)  Abdominal Pain Right Upper Quadrant  (ICD-789.01) 4)  Acute Bronchitis  (ICD-466.0) 5)  Chest Pain  (ICD-786.50) 6)  Depression, Chronic  (ICD-311) 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) 8)  Obstructive Chronic Bronchitis With Exacerbation  (ICD-491.21) 9)  Glucose Intolerance  (ICD-271.3) 10)  Acute Cystitis  (ICD-595.0) 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 3 At Bedtime 2)  Lipitor 10 Mg  Tabs (Atorvastatin Calcium) .... Once Daily 3)  Premarin 0.625 Mg  Tabs (  Estrogens Conjugated) .... Once Daily 4)  Flonase 50 Mcg/act  Susp (Fluticasone Propionate) .... Once Daily 5)  Pantoprazole Sodium 40 Mg Tbec (Pantoprazole Sodium) .... One By Mouth Daily 6)  Celebrex 200 Mg Caps (Celecoxib) .... One By Mouth Daily 7)  Vicoprofen 7.5-200 Mg  Tabs (Hydrocodone-Ibuprofen) .... Q 12 Hrs As Needed  Not To Exceed Two A Day 8)  Symbicort 80-4.5 Mcg/act  Aero  (Budesonide-Formoterol Fumarate) .... 2 Puffs Once Daily 9)  Alprazolam 0.5 Mg Tbdp (Alprazolam) .... One By Mouth Three Times A Day As Needed For Anxiety 10)  Glucosamine-Chondroitin 1500-1200 Mg/50ml Liqd (Glucosamine-Chondroitin) .Marland Kitchen.. 1 Once Daily 11)  Fish Oil 1000 Mg Caps (Omega-3 Fatty Acids) .Marland Kitchen.. 1 Once Daily 12)  Zinc 13)  Benicar Hct 40-12.5 Mg Tabs (Olmesartan Medoxomil-Hctz) .Marland Kitchen.. 1 Once Daily 14)  Vitamin B-1 100 Mg Tabs (Thiamine Hcl) .... One By Mouth Daily 15)  Folbee 2.5-25-1 Mg Tabs (Folic Acid-Vit B6-Vit B12) .... One By Mouth Daily 16)  Doxepin Hcl 25 Mg Caps (Doxepin Hcl) .... Once Daily 17)  Claritin 10 Mg Tabs (Loratadine) .Marland Kitchen.. 1 Once Daily 18)  Pristiq 50 Mg Xr24h-Tab (Desvenlafaxine Succinate) .... One By Mouth Daily  Current Medications (verified): 1)  Imipramine Hcl 50 Mg Tabs (Imipramine Hcl) .... 3 At Bedtime 2)  Lipitor 10 Mg  Tabs (Atorvastatin Calcium) .... Once Daily 3)  Premarin 0.625 Mg  Tabs (Estrogens Conjugated) .... Once Daily 4)  Flonase 50 Mcg/act  Susp (Fluticasone Propionate) .... Once Daily 5)  Pantoprazole Sodium 40 Mg Tbec (Pantoprazole Sodium) .... One By Mouth Daily 6)  Celebrex 200 Mg Caps (Celecoxib) .... One By Mouth Daily 7)  Vicoprofen 7.5-200 Mg  Tabs (Hydrocodone-Ibuprofen) .... Q 12 Hrs As Needed  Not To Exceed Two A Day 8)  Symbicort 80-4.5 Mcg/act  Aero (Budesonide-Formoterol Fumarate) .... 2 Puffs Once Daily 9)  Alprazolam 0.5 Mg Tbdp (Alprazolam) .... One By Mouth Three Times A Day As Needed For Anxiety 10)  Glucosamine-Chondroitin 1500-1200 Mg/23ml Liqd (Glucosamine-Chondroitin) .Marland Kitchen.. 1 Once Daily 11)  Fish Oil 1000 Mg Caps (Omega-3 Fatty Acids) .Marland Kitchen.. 1 Once Daily 12)  Zinc 13)  Benicar Hct 40-12.5 Mg Tabs (Olmesartan Medoxomil-Hctz) .Marland Kitchen.. 1 Once Daily 14)  Vitamin B-1 100 Mg Tabs (Thiamine Hcl) .... One By Mouth Daily 15)  Folbee 2.5-25-1 Mg Tabs (Folic Acid-Vit B6-Vit B12) .... One By Mouth Daily 16)  Doxepin Hcl 25 Mg Caps  (Doxepin Hcl) .... Once Daily 17)  Claritin 10 Mg Tabs (Loratadine) .Marland Kitchen.. 1 Once Daily  Allergies (verified): 1)  Pcn 2)  * Latex  Past History:  Family History: Last updated: 05/01/2007 Family History of Alcoholism/Addiction Family History Diabetes 1st degree relative Family History of Cardiovascular disorder  Social History: Last updated: 07/25/2007 Married Never Smoked Alcohol use-yes Drug use-no Regular exercise-no  Risk Factors: Alcohol Use: 4+ (07/13/2010) Exercise: no (07/25/2007)  Risk Factors: Smoking Status: never (07/13/2010) Passive Smoke Exposure: no (07/13/2010)  Past medical, surgical, family and social histories (including risk factors) reviewed, and no changes noted (except as noted below).  Past Medical History: Reviewed history from 10/26/2008 and no changes required. ETOH Allergic rhinitis Diverticulosis, colon Hypertension Depression Osteoarthritis Asthma GERD  Past Surgical History: Reviewed history from 09/27/2007 and no changes required. 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 Family History of Alcoholism/Addiction Family History Diabetes 1st  degree relative Family History of Cardiovascular disorder  Social History: Reviewed history from 07/25/2007 and no changes required. Married Never Smoked Alcohol use-yes Drug use-no Regular exercise-no  Review of Systems  The patient denies anorexia, fever, weight loss, weight gain, vision loss, decreased hearing, hoarseness, chest pain, syncope, dyspnea on exertion, peripheral edema, prolonged cough, headaches, hemoptysis, abdominal pain, melena, hematochezia, severe indigestion/heartburn, hematuria, incontinence, genital sores, muscle weakness, suspicious skin lesions, transient  blindness, difficulty walking, depression, unusual weight change, abnormal bleeding, enlarged lymph nodes, angioedema, and breast masses.         Flu Vaccine Consent Questions     Do you have a history of severe allergic reactions to this vaccine? no    Any prior history of allergic reactions to egg and/or gelatin? no    Do you have a sensitivity to the preservative Thimersol? no    Do you have a past history of Guillan-Barre Syndrome? no    Do you currently have an acute febrile illness? no    Have you ever had a severe reaction to latex? no    Vaccine information given and explained to patient? yes    Are you currently pregnant? no    Lot Number:[id number]   Exp Date:04/15/2011   Site Given  Left Deltoid IM   Physical Exam  General:  Well-developed,well-nourished,in no acute distress; alert,appropriate and cooperative throughout examination Head:  Normocephalic and atraumatic without obvious abnormalities. No apparent alopecia or balding. Eyes:  pupils equal and pupils round.   Ears:  R ear normal and L ear normal.   Nose:  no external deformity and no nasal discharge.   Mouth:  pharynx pink and moist and no erythema.   Neck:  No deformities, masses, or tenderness noted. Lungs:  normal respiratory effort and no wheezes.   Heart:  normal rate and regular rhythm.   Abdomen:  soft, normal bowel sounds, no distention, no masses, no guarding, no abdominal hernia, no hepatomegaly, and no splenomegaly.  slightly tender right upper quadrant to deep palpation. Msk:  lumbar lordosis, SI joint tenderness, and trigger point tenderness.   Neurologic:  alert & oriented X3 and gait normal.     Impression & Recommendations:  Problem # 1:  MEMORY LOSS (ICD-780.93) assessment of memory loss at the neurologist has noted improvement with folbee and thiamine  Problem # 2:  ASTHMA (ICD-493.90) no increased SOB tremor is better  Her updated medication list for this problem includes:    Symbicort  80-4.5 Mcg/act Aero (Budesonide-formoterol fumarate) .Marland Kitchen... 2 puffs once daily  Problem # 3:  DEPRESSION (ICD-311) Assessment: Unchanged  The following medications were removed from the medication list:    Pristiq 50 Mg Xr24h-tab (Desvenlafaxine succinate) ..... One by mouth daily Her updated medication list for this problem includes:    Imipramine Hcl 50 Mg Tabs (Imipramine hcl) .Marland KitchenMarland KitchenMarland KitchenMarland Kitchen 3 at bedtime    Alprazolam 0.5 Mg Tbdp (Alprazolam) ..... One by mouth three times a day as needed for anxiety    Doxepin Hcl 25 Mg Caps (Doxepin hcl) ..... Once daily  Discussed treatment options, including trial of antidpressant medication. Will refer to behavioral health. Follow-up call in in 24-48 hours and recheck in 2 weeks, sooner as needed. Patient agrees to call if any worsening of symptoms or thoughts of doing harm arise. Verified that the patient has no suicidal ideation at this time.   Problem # 4:  GERD (ICD-530.81) Assessment: Unchanged stable Her updated medication list for this problem includes:  Pantoprazole Sodium 40 Mg Tbec (Pantoprazole sodium) ..... One by mouth daily  Labs Reviewed: Hgb: 13.1 (11/09/2009)   Hct: 41.1 (11/09/2009)  Problem # 5:  HYPERTENSION (ICD-401.9)  Her updated medication list for this problem includes:    Benicar Hct 40-12.5 Mg Tabs (Olmesartan medoxomil-hctz) .Marland Kitchen... 1 once daily  BP today: 130/80 Prior BP: 120/82 (06/22/2010)  Prior 10 Yr Risk Heart Disease: 7 % (03/25/2010)  Labs Reviewed: K+: 5.2 (06/22/2010) Creat: : 1.1 (06/22/2010)   Chol: 140 (01/03/2007)   HDL: 58.4 (01/03/2007)   LDL: 59 (01/03/2007)   TG: 112 (01/03/2007)  Complete Medication List: 1)  Imipramine Hcl 50 Mg Tabs (Imipramine hcl) .... 3 at bedtime 2)  Lipitor 10 Mg Tabs (Atorvastatin calcium) .... Once daily 3)  Premarin 0.625 Mg Tabs (Estrogens conjugated) .... Once daily 4)  Flonase 50 Mcg/act Susp (Fluticasone propionate) .... Once daily 5)  Pantoprazole Sodium 40 Mg Tbec  (Pantoprazole sodium) .... One by mouth daily 6)  Celebrex 200 Mg Caps (Celecoxib) .... One by mouth daily 7)  Vicoprofen 7.5-200 Mg Tabs (Hydrocodone-ibuprofen) .... Q 12 hrs as needed  not to exceed two a day 8)  Symbicort 80-4.5 Mcg/act Aero (Budesonide-formoterol fumarate) .... 2 puffs once daily 9)  Alprazolam 0.5 Mg Tbdp (Alprazolam) .... One by mouth three times a day as needed for anxiety 10)  Glucosamine-chondroitin 1500-1200 Mg/44ml Liqd (Glucosamine-chondroitin) .Marland Kitchen.. 1 once daily 11)  Fish Oil 1000 Mg Caps (Omega-3 fatty acids) .Marland Kitchen.. 1 once daily 12)  Zinc  13)  Benicar Hct 40-12.5 Mg Tabs (Olmesartan medoxomil-hctz) .Marland Kitchen.. 1 once daily 14)  Vitamin B-1 100 Mg Tabs (Thiamine hcl) .... One by mouth daily 15)  Folbee 2.5-25-1 Mg Tabs (Folic acid-vit b6-vit b12) .... One by mouth daily 16)  Doxepin Hcl 25 Mg Caps (Doxepin hcl) .... Once daily 17)  Claritin 10 Mg Tabs (Loratadine) .Marland Kitchen.. 1 once daily  Other Orders: Admin 1st Vaccine (86578) Flu Vaccine 74yrs + ([id number])  Hypertension Assessment/Plan:      The patient's hypertensive risk group is category B: At least one risk factor (excluding diabetes) with no target organ damage.  Her calculated 10 year risk of coronary heart disease is 7 %.  Today's blood pressure is 130/80.  Her blood pressure goal is < 140/90.  Patient Instructions: 1)  STAY off the pristiq and resume the multivitamins 2)  Please schedule a follow-up appointment in 2 months.

## 2010-11-17 NOTE — Consult Note (Signed)
Summary: [facility]  [facility]   Imported By: Maryln Gottron 09/15/2010 14:10:26  _____________________________________________________________________  External Attachment:    Type:   Image     Comment:   External Document

## 2010-11-17 NOTE — Assessment & Plan Note (Signed)
Summary: SORE ON BACK OF NECK // RS   Vital Signs:  Patient profile:   65 year old female Height:      67 inches Weight:      192 pounds BMI:     30.18 Temp:     98.2 degrees F oral Pulse rate:   72 / minute Resp:     14 per minute BP sitting:   124 / 80  (left arm)  Vitals Entered By: Willy Eddy, LPN (September 14, 2010 4:36 PM) CC: c/o rash with itching and to dr [facility] who gave advise- c/o burnon rt thumb- and c/o possible spider on nape of neck-to urgent care 10 days ago and was given doxycycline 100 bid for 10 days and has completed and continues to have knot on neck Is Patient Diabetic? No   Primary Care Provider:  Stacie Glaze MD  CC:  c/o rash with itching and to dr hall who gave advise- c/o burnon rt thumb- and c/o possible spider on nape of neck-to urgent care 10 days ago and was given doxycycline 100 bid for 10 days and has completed and continues to have knot on neck.  History of Present Illness: memory loss lesion on neck seen by derm "spiderbite" self treated with bacitracin skin peeling at site redness and tender loculated ( lumpy) at base of neck on left HTN stable dirinks "occasionally" hx of alcoholism  Preventive Screening-Counseling & Management  Alcohol-Tobacco     Alcohol drinks/day: 1     Alcohol Counseling: to decrease amount and/or frequency of alcohol intake     Feels guilty re: drinking: yes     Smoking Status: never     Passive Smoke Exposure: no  Problems Prior to Update: 1)  Memory Loss  (ICD-780.93) 2)  Alcohol Abuse  (ICD-305.00) 3)  Abdominal Pain Right Upper Quadrant  (ICD-789.01) 4)  Acute Bronchitis  (ICD-466.0) 5)  Chest Pain  (ICD-786.50) 6)  Depression, Chronic  (ICD-311) 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) 8)  Obstructive Chronic Bronchitis With Exacerbation  (ICD-491.21) 9)  Glucose Intolerance  (ICD-271.3) 10)  Acute Cystitis  (ICD-595.0) 11)  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) 12)  Preoperative Examination   (ICD-V72.84) 13)  Gerd  (ICD-530.81) 14)  Osteoarthritis  (ICD-715.90) 15)  Panic Attack  (ICD-300.01) 16)  Family History Diabetes 1st Degree Relative  (ICD-V18.0) 17)  Family History of Alcoholism/addiction  (ICD-V61.41) 18)  Depression  (ICD-311) 19)  Asthma  (ICD-493.90) 20)  Hypertension  (ICD-401.9) 21)  Diverticulosis, Colon  (ICD-562.10) 22)  Allergic Rhinitis  (ICD-477.9) 23)  Menopausal Disorder  (ICD-627.9) 24)  Fibromyalgia  (ICD-729.1)  Current Problems (verified): 1)  Memory Loss  (ICD-780.93) 2)  Alcohol Abuse  (ICD-305.00) 3)  Abdominal Pain Right Upper Quadrant  (ICD-789.01) 4)  Acute Bronchitis  (ICD-466.0) 5)  Chest Pain  (ICD-786.50) 6)  Depression, Chronic  (ICD-311) 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) 8)  Obstructive Chronic Bronchitis With Exacerbation  (ICD-491.21) 9)  Glucose Intolerance  (ICD-271.3) 10)  Acute Cystitis  (ICD-595.0) 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 3 At Bedtime 2)  Lipitor 10 Mg  Tabs (Atorvastatin Calcium) .... Once Daily 3)  Premarin 0.625 Mg  Tabs (Estrogens Conjugated) .... Once Daily 4)  Flonase 50 Mcg/act  Susp (Fluticasone Propionate) .... Once Daily 5)  Pantoprazole Sodium 40 Mg Tbec (Pantoprazole Sodium) .Marland Kitchen.. 1 Once Daily 6)  Celebrex 200 Mg Caps (Celecoxib) .... One By Mouth Daily 7)  Vicoprofen 7.5-200 Mg  Tabs (Hydrocodone-Ibuprofen) .... Q 12 Hrs As  Needed  Not To Exceed Two A Day 8)  Symbicort 80-4.5 Mcg/act  Aero (Budesonide-Formoterol Fumarate) .... 2 Puffs Once Daily 9)  Alprazolam 0.5 Mg Tbdp (Alprazolam) .... One By Mouth Three Times A Day As Needed For Anxiety 10)  Glucosamine-Chondroitin 1500-1200 Mg/10ml Liqd (Glucosamine-Chondroitin) .Marland Kitchen.. 1 Once Daily 11)  Fish Oil 1000 Mg Caps (Omega-3 Fatty Acids) .Marland Kitchen.. 1 Once Daily 12)  Zinc 13)  Benicar Hct 40-12.5 Mg Tabs (Olmesartan Medoxomil-Hctz) .Marland Kitchen.. 1 Once Daily 14)  Vitamin B-1 100 Mg Tabs (Thiamine Hcl) .... One By Mouth Daily 15)  Folbee 2.5-25-1 Mg Tabs (Folic Acid-Vit B6-Vit B12) .... One By Mouth Daily 16)  Doxepin Hcl 25 Mg Caps (Doxepin Hcl) .... Once Daily 17)  Claritin 10 Mg Tabs (Loratadine) .Marland Kitchen.. 1 Once Daily 18)  Promethazine Hcl 25 Mg Tabs (Promethazine Hcl) .Marland Kitchen.. 1 Every 4-6 Hours As Needed N &v 19)  Citalopram Hydrobromide 20 Mg Tabs (Citalopram Hydrobromide) .Marland Kitchen.. 1 Once Daily  Current Medications (verified): 1)  Imipramine Hcl 50 Mg Tabs (Imipramine Hcl) .... 3 At Bedtime 2)  Lipitor 10 Mg  Tabs (Atorvastatin Calcium) .... Once Daily 3)  Premarin 0.625 Mg  Tabs (Estrogens Conjugated) .... Once Daily 4)  Flonase 50 Mcg/act  Susp (Fluticasone Propionate) .... Once Daily 5)  Pantoprazole Sodium 40 Mg Tbec (Pantoprazole Sodium) .Marland Kitchen.. 1 Once Daily 6)  Celebrex 200 Mg Caps (Celecoxib) .... One By Mouth Daily 7)  Vicoprofen 7.5-200 Mg  Tabs (Hydrocodone-Ibuprofen) .... Q 12 Hrs As Needed  Not To Exceed Two A Day 8)  Symbicort 80-4.5 Mcg/act  Aero (Budesonide-Formoterol Fumarate) .... 2 Puffs Once Daily 9)  Alprazolam 0.5 Mg Tbdp (Alprazolam) .... One By Mouth Three Times A Day As Needed For Anxiety 10)  Glucosamine-Chondroitin 1500-1200 Mg/85ml Liqd (Glucosamine-Chondroitin) .Marland Kitchen.. 1 Once Daily 11)  Fish Oil 1000 Mg Caps (Omega-3 Fatty Acids) .Marland Kitchen.. 1 Once Daily 12)  Zinc 13)  Benicar Hct 40-12.5 Mg Tabs (Olmesartan Medoxomil-Hctz) .Marland Kitchen.. 1 Once Daily 14)  Vitamin B-1 100 Mg Tabs  (Thiamine Hcl) .... One By Mouth Daily 15)  Folbee 2.5-25-1 Mg Tabs (Folic Acid-Vit B6-Vit B12) .... One By Mouth Daily 16)  Doxepin Hcl 25 Mg Caps (Doxepin Hcl) .... Once Daily 17)  Claritin 10 Mg Tabs (Loratadine) .Marland Kitchen.. 1 Once Daily 18)  Promethazine Hcl 25 Mg Tabs (Promethazine Hcl) .Marland Kitchen.. 1 Every 4-6 Hours As Needed N &v 19)  Citalopram Hydrobromide 20 Mg Tabs (Citalopram Hydrobromide) .Marland Kitchen.. 1 Once Daily 20)  Smz-Tmp Ds 800-160 Mg Tabs (Sulfamethoxazole-Trimethoprim) .... One By Mouth Two Times A Day  Allergies (verified): 1)  Pcn 2)  * Latex  Past History:  Family History: Last updated: 05/01/2007 Family History of Alcoholism/Addiction Family History Diabetes 1st degree relative Family History of Cardiovascular disorder  Social History: Last updated: 07/25/2007 Married Never Smoked Alcohol use-yes Drug use-no Regular exercise-no  Risk Factors: Alcohol Use: 1 (09/14/2010) Exercise: no (07/25/2007)  Risk Factors: Smoking  Status: never (09/14/2010) Passive Smoke Exposure: no (09/14/2010)  Past medical, surgical, family and social histories (including risk factors) reviewed, and no changes noted (except as noted below).  Past Medical History: Reviewed history from 10/26/2008 and no changes required. ETOH Allergic rhinitis Diverticulosis, colon Hypertension Depression Osteoarthritis Asthma GERD  Past Surgical History: Reviewed history from 09/27/2007 and no changes required. 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 Family History of Alcoholism/Addiction Family History Diabetes 1st degree relative Family History of Cardiovascular disorder  Social History: Reviewed history from 07/25/2007 and no changes required. Married Never Smoked Alcohol use-yes Drug  use-no Regular exercise-no  Review of Systems  The patient denies anorexia, fever, weight loss, weight gain, vision loss, decreased hearing, hoarseness, chest pain, syncope, dyspnea on exertion, peripheral edema, prolonged cough, headaches, hemoptysis, abdominal pain, melena, hematochezia, severe indigestion/heartburn, hematuria, incontinence, genital sores, muscle weakness, suspicious skin lesions, transient blindness, difficulty walking, depression, unusual weight change, abnormal bleeding, enlarged lymph nodes, angioedema, and breast masses.    Physical Exam  General:  Well-developed,well-nourished,in no acute distress; alert,appropriate and cooperative throughout examination Head:  Normocephalic and atraumatic without obvious abnormalities. No apparent alopecia or balding. Eyes:  pupils equal and pupils round.   Ears:  R ear normal and L ear normal.   Nose:  no external deformity and no nasal discharge.   Mouth:  pharynx pink and moist and no erythema.   Neck:  No deformities, masses, or tenderness noted. Lungs:  normal respiratory effort and no wheezes.   Abdomen:  soft, normal bowel sounds, no distention, no masses, no guarding, no abdominal hernia, no hepatomegaly, and no splenomegaly.  slightly tender right upper quadrant to deep palpation. Msk:  lumbar lordosis, SI joint tenderness, and trigger point tenderness.   Pulses:  R and L carotid,radial,femoral,dorsalis pedis and posterior tibial pulses are full and equal bilaterally Extremities:  no edema Neurologic:  gait normal and confused.     Impression & Recommendations:  Problem # 1:  SEBACEOUS CYST, NECK (ICD-706.2)  cyst on base of neck with multiple treatments ( doxy for "spider bite by derm, bacitracin by Pt) feel loculated from trama and mobile... differential included node but less likely I am uncmfortable with office based excission due to loculations and need to refer to gneral surgeon will cover with abx fo  now  septra DS for 10 days and refer to   Orders: Surgical Referral (Surgery)  Problem # 2:  ALCOHOL ABUSE (ICD-305.00) urged not to even drink one drink need to go back to AA  Problem # 3:  ASTHMA (ICD-493.90)  Her updated medication list for this problem includes:    Symbicort 80-4.5 Mcg/act Aero (Budesonide-formoterol fumarate) .Marland Kitchen... 2 puffs once daily  Problem # 4:  FIBROMYALGIA (ICD-729.1)  Her updated medication list for this problem includes:    Celebrex 200 Mg Caps (Celecoxib) ..... One by mouth daily    Vicoprofen 7.5-200 Mg Tabs (Hydrocodone-ibuprofen) ..... Q 12 hrs as needed  not to exceed two a day  Problem # 5:  MEMORY LOSS (ICD-780.93) see neurology consult  Complete Medication List: 1)  Imipramine Hcl 50 Mg Tabs (Imipramine hcl) .... 3 at bedtime 2)  Lipitor 10 Mg Tabs (Atorvastatin calcium) .... Once daily 3)  Premarin 0.625 Mg Tabs (Estrogens conjugated) .... Once daily 4)  Flonase 50 Mcg/act Susp (Fluticasone propionate) .Marland KitchenMarland KitchenMarland Kitchen  Once daily 5)  Pantoprazole Sodium 40 Mg Tbec (Pantoprazole sodium) .Marland Kitchen.. 1 once daily 6)  Celebrex 200 Mg Caps (Celecoxib) .... One by mouth daily 7)  Vicoprofen 7.5-200 Mg Tabs (Hydrocodone-ibuprofen) .... Q 12 hrs as needed  not to exceed two a day 8)  Symbicort 80-4.5 Mcg/act Aero (Budesonide-formoterol fumarate) .... 2 puffs once daily 9)  Alprazolam 0.5 Mg Tbdp (Alprazolam) .... One by mouth three times a day as needed for anxiety 10)  Glucosamine-chondroitin 1500-1200 Mg/55ml Liqd (Glucosamine-chondroitin) .Marland Kitchen.. 1 once daily 11)  Fish Oil 1000 Mg Caps (Omega-3 fatty acids) .Marland Kitchen.. 1 once daily 12)  Zinc  13)  Benicar Hct 40-12.5 Mg Tabs (Olmesartan medoxomil-hctz) .Marland Kitchen.. 1 once daily 14)  Vitamin B-1 100 Mg Tabs (Thiamine hcl) .... One by mouth daily 15)  Folbee 2.5-25-1 Mg Tabs (Folic acid-vit b6-vit b12) .... One by mouth daily 16)  Doxepin Hcl 25 Mg Caps (Doxepin hcl) .... Once daily 17)  Claritin 10 Mg Tabs (Loratadine) .Marland Kitchen.. 1  once daily 18)  Promethazine Hcl 25 Mg Tabs (Promethazine hcl) .Marland Kitchen.. 1 every 4-6 hours as needed n &v 19)  Citalopram Hydrobromide 20 Mg Tabs (Citalopram hydrobromide) .Marland Kitchen.. 1 once daily 20)  Smz-tmp Ds 800-160 Mg Tabs (Sulfamethoxazole-trimethoprim) .... One by mouth two times a day  Patient Instructions: 1)  Please schedule a follow-up appointment in 4 months. 2)  50 at bed time Prescriptions: VICOPROFEN 7.5-200 MG  TABS (HYDROCODONE-IBUPROFEN) q 12 hrs as needed  not to exceed two a day  #60 x 3   Entered and Authorized by:   Stacie Glaze MD   Signed by:   Stacie Glaze MD on 09/14/2010   Method used:   Print then Give to Patient   RxID:   [id number] SMZ-TMP DS 800-160 MG TABS (SULFAMETHOXAZOLE-TRIMETHOPRIM) one by mouth two times a day  #20 x 0   Entered and Authorized by:   Stacie Glaze MD   Signed by:   Stacie Glaze MD on 09/14/2010   Method used:   Electronically to        [facility]  [address] [phone]* (retail)       [address]       [place], [place]  [address]       Ph: [phone]       Fax: [phone]   RxID:   [id number]    Orders Added: 1)  Surgical Referral [Surgery] 2)  Est. Patient Level IV [34742]

## 2010-11-17 NOTE — Letter (Signed)
Summary: Letter from patient-problem with Rx.  Letter from patient-problem with Rx.   Imported By: Maryln Gottron 12/02/2009 13:30:22  _____________________________________________________________________  External Attachment:    Type:   Image     Comment:   External Document

## 2010-11-17 NOTE — Progress Notes (Signed)
Summary: Pt req Phenagan be called in for extreme nausea  Phone Note Call from Patient Call back at Home Phone [phone]   Caller: Patient Summary of Call: Pt called and is having extreme nausea. Pt is req that doctor call in [facility] [address]  Initial call taken by: Lucy Antigua,  August 08, 2010 2:52 PM  Follow-up for Phone Call        fyi- pt [facility] 5-6 months ago and is on antabuse at times Follow-up by: Willy Eddy, LPN,  August 08, 2010 3:00 PM  Additional Follow-up for Phone Call Additional follow up Details #1::        phenergan 25 mg  #20   one every 6 hrs for nausea  Additional Follow-up by: Gordy Savers  MD,  August 08, 2010 3:12 PM    New/Updated Medications: PROMETHAZINE HCL 25 MG TABS (PROMETHAZINE HCL) 1 every 4-6 hours as needed n &v Prescriptions: PROMETHAZINE HCL 25 MG TABS (PROMETHAZINE HCL) 1 every 4-6 hours as needed n &v  #20 x 0   Entered by:   Willy Eddy, LPN   Authorized by:   Gordy Savers  MD   Signed by:   Willy Eddy, LPN on 09/81/1914   Method used:   [facility] to        [facility]  [address] [phone][facility] (retail)       [address]       [place], [place]  [address]       Ph: [phone]       Fax: [phone]   RxID:   [id number]   Appended Document: Pt req Phenagan be called in for extreme nausea pt informedf

## 2010-11-17 NOTE — Progress Notes (Signed)
Summary: WCB 8-17  Phone Note Call from Patient   Caller: Patient Call For: Stacie Glaze MD Summary of Call: Pt wants to take Pristque in the am and Imipramine at night.  [facility] [phone] Needs both called in.  Pharmacist told her she could take both. Initial call taken by: Lynann Beaver CMA,  June 01, 2010 9:20 AM  Follow-up for Phone Call        why does she want to try pristiqu? has a therapsit told her to try or where did she get the idea? Follow-up by: Willy Eddy, LPN,  June 01, 2010 11:16 AM  Additional Follow-up for Phone Call Additional follow up Details #1::        per dr Lovell Sheehan- pristiq is a powerful drug and he will not prescribe it until she has a face-to-face office visit with dr Lovell Sheehan Additional Follow-up by: Willy Eddy, LPN,  June 01, 2010 3:20 PM    Additional Follow-up for Phone Call Additional follow up Details #2::    She says she's been on both Pristiq & Imipramine from Dr. Shela Commons, but that she has no refills on the Pristiq.  She'll check her bottles & call back.  Rudy Jew, RN  June 01, 2010 4:50 PM   Additional Follow-up for Phone Call Additional follow up Details #3:: Details for Additional Follow-up Action Taken: per dr Lovell Sheehan may take pristiq and imipramine, but he only wants her to take pristiq 50 once daily   Additional Follow-up by: Willy Eddy, LPN,  June 02, 2010 9:11 AM  New/Updated Medications: PRISTIQ 50 MG XR24H-TAB (DESVENLAFAXINE SUCCINATE) one by mouth daily Prescriptions: PRISTIQ 50 MG XR24H-TAB (DESVENLAFAXINE SUCCINATE) one by mouth daily  #30 x 1   Entered by:   Lynann Beaver CMA   Authorized by:   Stacie Glaze MD   Signed by:   Lynann Beaver CMA on 06/02/2010   Method used:   [facility]        [facility]  [address] [id number]       [address]       [place], [place]  [address]       Ph: [phone]       Fax: [phone]   RxID:    [id number]  Pt notified.

## 2010-11-17 NOTE — Assessment & Plan Note (Signed)
Summary: still sob/pt was seen at hosp 10-20-2009/njr   Vital Signs:  Patient profile:   65 year old female Weight:      190 pounds Temp:     96.9 degrees F oral Pulse rate:   64 / minute Pulse rhythm:   regular BP sitting:   140 / 90  (left arm) Cuff size:   regular  Vitals Entered By: Aimee Jones (October 23, 2009 9:10 AM) CC: ER FOLLOW UP   History of Present Illness: 65 yo here for ER follow up.  Was seen at [facility] on 10/19/2009 for wheezing. Has h/o asthma.  ER records reviewed: Afebrile, CBC, BMET, CXR, Cardiac enzymes (point of care) were all normal. Sent home with discharge diagnosis of bronchtis with Avelox 400 mg by mouth x 7 days, prednisone 50 mg by mouth daily x 5 days and Flovent two times a day.  Reports that she just finished her prednisone, still taking Avelox. Has been afebrile, no wheezing, no shortness of breath. Feels better but her daughter Banker) feels she needed to be seen today for nebulized albuterol treatment.  Current Medications (verified): 1)  Pristiq 100 Mg Xr24h-Tab (Desvenlafaxine Succinate) .... One By Mouth Daily 2)  Lipitor 10 Mg  Tabs (Atorvastatin Calcium) .... Once Daily 3)  Xenical 120 Mg  Caps (Orlistat) .... Once Daily 4)  Premarin 0.625 Mg  Tabs (Estrogens Conjugated) .... Once Daily 5)  Flonase 50 Mcg/act  Susp (Fluticasone Propionate) .... Once Daily 6)  Pantoprazole Sodium 40 Mg Tbec (Pantoprazole Sodium) .... One By Mouth Daily 7)  Celebrex 200 Mg Caps (Celecoxib) .... One By Mouth Daily 8)  Vicoprofen 7.5-200 Mg  Tabs (Hydrocodone-Ibuprofen) .... Q 6 Hrs As Needed 9)  Symbicort 80-4.5 Mcg/act  Aero (Budesonide-Formoterol Fumarate) .... 2 Puffs Once Daily 10)  Metoclopramide Hcl 10 Mg  Tabs (Metoclopramide Hcl) .Marland Kitchen.. 1 Before Meals and At Bedtime 11)  Alprazolam 0.5 Mg Tbdp (Alprazolam) .... One By Mouth Three Times A Day As Needed For Anxiety 12)  Claritin 10 Mg Caps (Loratadine) .Marland Kitchen.. 1 Once Daily 13)  Glucosamine-Chondroitin  1500-1200 Mg/18ml Liqd (Glucosamine-Chondroitin) .Marland Kitchen.. 1 Once Daily 14)  Fish Oil 1000 Mg Caps (Omega-3 Fatty Acids) .Marland Kitchen.. 1 Once Daily 15)  Zinc 16)  Benicar Hct 40-12.5 Mg Tabs (Olmesartan Medoxomil-Hctz) .Marland Kitchen.. 1 Once Daily 17)  Doxepin Hcl 10 Mg Caps (Doxepin Hcl) .Marland Kitchen.. 1 At Bedtime As Needed Sleep 18)  Zithromax Z-Pak 250 Mg Tabs (Azithromycin) .... Take As Directed 19)  Atuss Ds 30-4-30 Mg/33ml Susp (Pseudoephed Hcl-Cpm-Dm Hbr Tan) .... 2 Tsp Every 12 Hours  Allergies (verified): 1)  Pcn 2)  * Latex  Review of Systems      See HPI General:  Denies chills and fever. Resp:  Complains of cough, sputum productive, and wheezing; denies shortness of breath.  Physical Exam  General:  Well-developed,well-nourished,in no acute distress; alert,appropriate and cooperative throughout examination Nose:  no external deformity and no nasal discharge.   Mouth:  pharynx pink and moist and no erythema.   Lungs:  normal respiratory effort and very faint scattered exp wheezes. No crackles Heart:  normal rate and regular rhythm.   Psych:  good eye contact,talkative.   Impression & Recommendations:  Problem # 1:  ACUTE BRONCHITIS (ICD-466.0) Assessment New Resolving.  Given nebs in office today per patient request. Advised finishing course of abx. Pt non toxic and physical exam unremarkable. Her updated medication list for this problem includes:    Symbicort 80-4.5 Mcg/act Aero (Budesonide-formoterol  fumarate) .Marland Kitchen... 2 puffs once daily    Zithromax Z-pak 250 Mg Tabs (Azithromycin) .Marland Kitchen... Take as directed    Atuss Ds 30-4-30 Mg/3ml Susp (Pseudoephed hcl-cpm-dm hbr tan) .Marland Kitchen... 2 tsp every 12 hours  Complete Medication List: 1)  Pristiq 100 Mg Xr24h-tab (Desvenlafaxine succinate) .... One by mouth daily 2)  Lipitor 10 Mg Tabs (Atorvastatin calcium) .... Once daily 3)  Xenical 120 Mg Caps (Orlistat) .... Once daily 4)  Premarin 0.625 Mg Tabs (Estrogens conjugated) .... Once daily 5)  Flonase 50  Mcg/act Susp (Fluticasone propionate) .... Once daily 6)  Pantoprazole Sodium 40 Mg Tbec (Pantoprazole sodium) .... One by mouth daily 7)  Celebrex 200 Mg Caps (Celecoxib) .... One by mouth daily 8)  Vicoprofen 7.5-200 Mg Tabs (Hydrocodone-ibuprofen) .... Q 6 hrs as needed 9)  Symbicort 80-4.5 Mcg/act Aero (Budesonide-formoterol fumarate) .... 2 puffs once daily 10)  Metoclopramide Hcl 10 Mg Tabs (Metoclopramide hcl) .Marland Kitchen.. 1 before meals and at bedtime 11)  Alprazolam 0.5 Mg Tbdp (Alprazolam) .... One by mouth three times a day as needed for anxiety 12)  Claritin 10 Mg Caps (Loratadine) .Marland Kitchen.. 1 once daily 13)  Glucosamine-chondroitin 1500-1200 Mg/5ml Liqd (Glucosamine-chondroitin) .Marland Kitchen.. 1 once daily 14)  Fish Oil 1000 Mg Caps (Omega-3 fatty acids) .Marland Kitchen.. 1 once daily 15)  Zinc  16)  Benicar Hct 40-12.5 Mg Tabs (Olmesartan medoxomil-hctz) .Marland Kitchen.. 1 once daily 17)  Doxepin Hcl 10 Mg Caps (Doxepin hcl) .Marland Kitchen.. 1 at bedtime as needed sleep 18)  Zithromax Z-pak 250 Mg Tabs (Azithromycin) .... Take as directed 19)  Atuss Ds 30-4-30 Mg/50ml Susp (Pseudoephed hcl-cpm-dm hbr tan) .... 2 tsp every 12 hours

## 2010-11-17 NOTE — Assessment & Plan Note (Signed)
Summary: H/A CONCERNS // RS   Vital Signs:  Patient profile:   65 year old female Weight:      190 pounds Temp:     98.2 degrees F oral BP sitting:   120 / 82  (left arm) Cuff size:   regular  Vitals Entered By: Kathrynn Speed CMA (June 22, 2010 2:32 PM) CC: Memory conerns, can't remember how to get to church one mile from home, headache, nausea, off & on, src Is Patient Diabetic? No   History of Present Illness: Patient seen today for further evaluation. She has long history of alcohol abuse with drinking approximately 1 pint of vodka per day but stopped about one month ago and has been compliant with abstinence.  Attending AA regularly and very supportive husband.  She is accompanied by husband. She has some memory deficits,  particularly worse over the past few weeks. For example, she got lost while driving to familiar places and is forgetting days of week. He has also noticed some confabulation. She does take a multivitamin. Appetite is fair and she is eating fairly balanced diet.  She's also had some issues of almost daily bifrontal headaches. Occasional nausea without vomiting. No visual changes. Has had prior falls but no recent head trauma. No recent thyroid functions or other labs. Takes multiple medications and these were reviewed.  Preventive Screening-Counseling & Management  Alcohol-Tobacco     Alcohol drinks/day: 4+     Smoking Status: never     Passive Smoke Exposure: no  Current Medications (verified): 1)  Imipramine Hcl 50 Mg Tabs (Imipramine Hcl) .... 3 At Bedtime 2)  Lipitor 10 Mg  Tabs (Atorvastatin Calcium) .... Once Daily 3)  Premarin 0.625 Mg  Tabs (Estrogens Conjugated) .... Once Daily 4)  Flonase 50 Mcg/act  Susp (Fluticasone Propionate) .... Once Daily 5)  Pantoprazole Sodium 40 Mg Tbec (Pantoprazole Sodium) .... One By Mouth Daily 6)  Celebrex 200 Mg Caps (Celecoxib) .... One By Mouth Daily 7)  Vicoprofen 7.5-200 Mg  Tabs (Hydrocodone-Ibuprofen)  .... Q 12 Hrs As Needed  Not To Exceed Two A Day 8)  Symbicort 80-4.5 Mcg/act  Aero (Budesonide-Formoterol Fumarate) .... 2 Puffs Once Daily 9)  Alprazolam 0.5 Mg Tbdp (Alprazolam) .... One By Mouth Three Times A Day As Needed For Anxiety 10)  Glucosamine-Chondroitin 1500-1200 Mg/19ml Liqd (Glucosamine-Chondroitin) .Marland Kitchen.. 1 Once Daily 11)  Fish Oil 1000 Mg Caps (Omega-3 Fatty Acids) .Marland Kitchen.. 1 Once Daily 12)  Zinc 13)  Benicar Hct 40-12.5 Mg Tabs (Olmesartan Medoxomil-Hctz) .Marland Kitchen.. 1 Once Daily 14)  Vitamin B-1 100 Mg Tabs (Thiamine Hcl) .... One By Mouth Daily 15)  Folbee 2.5-25-1 Mg Tabs (Folic Acid-Vit B6-Vit B12) .... One By Mouth Daily 16)  Doxepin Hcl 25 Mg Caps (Doxepin Hcl) .... Once Daily 17)  Claritin 10 Mg Tabs (Loratadine) .Marland Kitchen.. 1 Once Daily 18)  Pristiq 50 Mg Xr24h-Tab (Desvenlafaxine Succinate) .... 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  The patient denies anorexia, fever, weight loss, vision loss, chest pain, syncope, dyspnea on  exertion, peripheral edema, prolonged cough, hemoptysis, abdominal pain, melena, hematochezia, severe indigestion/heartburn, muscle weakness, difficulty walking, and depression.    Physical Exam  General:  Well-developed,well-nourished,in no acute distress; alert,appropriate and cooperative throughout examination Head:  Normocephalic and atraumatic without obvious abnormalities. No apparent alopecia or balding. Eyes:  pupils equal, pupils round, and pupils reactive to light.   Mouth:  Oral mucosa and oropharynx without lesions or exudates.  Teeth in good repair. Neck:  No deformities, masses, or tenderness noted. Lungs:  Normal respiratory effort, chest expands symmetrically. Lungs are clear to auscultation, no crackles or wheezes. Heart:  normal rate and regular rhythm.   Extremities:  no edema Neurologic:  alert & oriented X3, cranial nerves II-XII intact, strength normal in all extremities, sensation intact to light touch, gait normal, DTRs symmetrical and normal, finger-to-nose normal, toes down bilaterally on Babinski, and Romberg negative.  she has mild tremor at rest in both hands Psych:  normally interactive, good eye contact, not anxious appearing, and not depressed appearing.   MMSE 26/30.   Impression & Recommendations:  Problem # 1:  MEMORY LOSS (ICD-780.93) Assessment Deteriorated  26/30 on Mini-Mental status exam. Question related to prior alcohol abuse.  Obtain screening labs and CT of head without contrast. Discussed importance of multivitamin with folic acid and thiamine.  Obtaining CT head sec to relatively acute memory changes assoc with headaches and some nausea.  Orders: Radiology Referral (Radiology) Specimen Handling (16109) Venipuncture (60454) TLB-BMP (Basic Metabolic Panel-BMET) (80048-METABOL) TLB-TSH (Thyroid Stimulating Hormone) (84443-TSH) TLB-B12, Serum-Total ONLY (09811-B14) TLB-Folic Acid (Folate) (82746-FOL)  Problem # 2:  ALCOHOL ABUSE  (ICD-305.00) abstinent for one month.  Discussed MVI supplement and ongoing group support.  Complete Medication List: 1)  Imipramine Hcl 50 Mg Tabs (Imipramine hcl) .... 3 at bedtime 2)  Lipitor 10 Mg Tabs (Atorvastatin calcium) .... Once daily 3)  Premarin 0.625 Mg Tabs (Estrogens conjugated) .... Once daily 4)  Flonase 50 Mcg/act Susp (Fluticasone propionate) .... Once daily 5)  Pantoprazole Sodium 40 Mg Tbec (Pantoprazole sodium) .... One by mouth daily 6)  Celebrex 200 Mg Caps (Celecoxib) .... One by mouth daily 7)  Vicoprofen 7.5-200 Mg Tabs (Hydrocodone-ibuprofen) .... Q 12 hrs as needed  not to exceed two a day 8)  Symbicort 80-4.5 Mcg/act Aero (Budesonide-formoterol fumarate) .... 2 puffs once daily 9)  Alprazolam 0.5 Mg Tbdp (Alprazolam) .... One by mouth three times a day as needed for anxiety 10)  Glucosamine-chondroitin 1500-1200 Mg/43ml Liqd (Glucosamine-chondroitin) .Marland Kitchen.. 1 once daily 11)  Fish Oil 1000 Mg Caps (Omega-3 fatty acids) .Marland Kitchen.. 1 once daily 12)  Zinc  13)  Benicar Hct 40-12.5 Mg Tabs (Olmesartan medoxomil-hctz) .Marland Kitchen.. 1 once daily 14)  Vitamin B-1 100 Mg Tabs (Thiamine hcl) .... One by mouth daily 15)  Folbee 2.5-25-1 Mg Tabs (Folic acid-vit b6-vit b12) .... One by mouth daily 16)  Doxepin Hcl 25 Mg Caps (Doxepin hcl) .... Once daily 17)  Claritin 10 Mg Tabs (Loratadine) .Marland Kitchen.. 1 once daily 18)  Pristiq 50 Mg Xr24h-tab (Desvenlafaxine succinate) .... One by mouth daily  Patient Instructions: 1)  Schedule follow up with Dr Lovell Sheehan in 2-3 weeks. 2)  Be sure to continue  multivitamin. 3)  Continue to abstain from alcohol.

## 2010-11-24 ENCOUNTER — Ambulatory Visit (INDEPENDENT_AMBULATORY_CARE_PROVIDER_SITE_OTHER): Payer: Medicare Other | Admitting: Family Medicine

## 2010-11-24 ENCOUNTER — Encounter: Payer: Self-pay | Admitting: Family Medicine

## 2010-11-24 DIAGNOSIS — M62838 Other muscle spasm: Secondary | ICD-10-CM

## 2010-11-24 DIAGNOSIS — R079 Chest pain, unspecified: Secondary | ICD-10-CM

## 2010-11-24 MED ORDER — CYCLOBENZAPRINE HCL 5 MG PO TABS: 5.0000 mg | ORAL_TABLET | Freq: Three times a day (TID) | ORAL | Status: AC | PRN

## 2010-11-24 NOTE — Patient Instructions (Signed)
Avoid Aleve as much as possible for the next couple of weeks. Try over there counter Prilosec 20 mg daily for the next 2 weeks. Be in touch with Dr Lovell Sheehan if abdominal pain not improved by then and sooner as needed.

## 2010-11-24 NOTE — Progress Notes (Signed)
  Subjective:    Patient ID: Aimee Jones, female    DOB: 18-May-1946, 65 y.o.   MRN: [id number]  HPI   Patient seen as a work in with five-day history of pain underneath the lower sternum and epigastric area. Described as a dull heavy feeling and constant. No radiation to arm or neck or back. No associated dyspnea. No exertional quality. No exacerbating features. Took Pepto-Bismol with mild relief. Patient denies history of peptic ulcer disease. Does take Aleve 2 per day.  Also on Celebrex 200 mg per days.  Reported fibromyalgia syndrome. Denies alcohol use. She denies any recent nausea or vomiting. No appetite or weight changes. No melena. Some black stool from recent Pepto-Bismol. Denies any pleuritic pain or cough.  Also complains of some daily chronic headaches occ region which are constant and dull.  She has sensation of muscle tightening post neck frequently.  She has had similar headaches in past.  Review of Systems  Constitutional: Negative for fever, chills, activity change, appetite change and fatigue.  Respiratory: Negative for cough, choking, shortness of breath, wheezing and stridor.   Cardiovascular: Negative for palpitations and leg swelling.  Gastrointestinal: Negative for nausea, vomiting, diarrhea, constipation, blood in stool and abdominal distention.  Genitourinary: Negative for flank pain.  Musculoskeletal: Negative for back pain.  Neurological: Negative for dizziness.  Hematological: Negative for adenopathy.       Objective:   Physical Exam     Patient is alert and in no distress. Vital signs are reviewed and stable  Pupils equal round reactive to light   eardrum normal  Oropharynx clear  neck no thyromegaly or masses.  Supple with full ROM.  NO spinal tenderness.  Chest clear to auscultation  Heart regular rhythm and rate with no murmur  Chest wall nontender.  Abdomen mild midepigastric tenderness. No guarding or rebound. No masses palpated. No hepatomegaly  or splenomegaly.  Extremities no edema  Skin no rash    Assessment & Plan:  #1  midepigastric to lower substernal pain. Doubt cardiac.  EKG sinus rhythm with no acute changes.  She needs to avoid Aleve and Celebrex for now and start Prilosec 20 mg daily and follow up with primary in 2 weeks if no better. #2  Probable tension type headache.  Low dose cyclobenzaprine 5 mg daily.

## 2010-12-12 ENCOUNTER — Telehealth: Payer: Self-pay | Admitting: Internal Medicine

## 2010-12-12 NOTE — Telephone Encounter (Signed)
Adv;ilse

## 2010-12-12 NOTE — Telephone Encounter (Signed)
Advised pt and she will go back to pain clinic.

## 2010-12-12 NOTE — Telephone Encounter (Signed)
If she has been to the head ache clinic would recommend return consult.. If head ache need more than vicoprofen they need evaluation. Could consider topramax 25 mg daily increasing to 50 mg daily in 2-3 weeks with ROV in 30 days

## 2010-12-12 NOTE — Telephone Encounter (Signed)
Pt has severe headache for the past week.  Does not want to see md but would like to have something stronger than the vicoprofen sent to [facility] [address].  Pt states that she has also gone to the Headache clinic.

## 2010-12-12 NOTE — Telephone Encounter (Signed)
Please advise 

## 2010-12-27 ENCOUNTER — Telehealth: Payer: Self-pay | Admitting: Internal Medicine

## 2010-12-27 NOTE — Telephone Encounter (Signed)
Having intense itching all over her body. No pain. Would like [facility].

## 2010-12-27 NOTE — Telephone Encounter (Signed)
No rash,just itching suggested benadryl cream and pills and can bathe with alveeno bath

## 2010-12-31 LAB — CBC
MCHC: 32.8 g/dL (ref 30.0–36.0)
MCV: 84 fL (ref 78.0–100.0)
Platelets: 434 10*3/uL — ABNORMAL HIGH (ref 150–400)
WBC: 8.3 10*3/uL (ref 4.0–10.5)

## 2010-12-31 LAB — DIFFERENTIAL
Basophils Relative: 1 % (ref 0–1)
Eosinophils Absolute: 0.3 10*3/uL (ref 0.0–0.7)
Neutrophils Relative %: 68 % (ref 43–77)

## 2010-12-31 LAB — POCT I-STAT, CHEM 8
Calcium, Ion: 1.21 mmol/L (ref 1.12–1.32)
Glucose, Bld: 102 mg/dL — ABNORMAL HIGH (ref 70–99)
HCT: 44 % (ref 36.0–46.0)
Hemoglobin: 15 g/dL (ref 12.0–15.0)
Potassium: 3.8 mEq/L (ref 3.5–5.1)

## 2010-12-31 LAB — POCT CARDIAC MARKERS

## 2011-01-02 ENCOUNTER — Telehealth: Payer: Self-pay | Admitting: *Deleted

## 2011-01-02 NOTE — Telephone Encounter (Signed)
Wants something stronger than Vicoprofen for headaches.

## 2011-01-03 ENCOUNTER — Telehealth: Payer: Self-pay | Admitting: *Deleted

## 2011-01-03 NOTE — Telephone Encounter (Signed)
See message dated 13-19- per dr Lovell Sheehan- he will n ot given any stronger pain med- needs to see headache specialist-pt informed

## 2011-01-10 ENCOUNTER — Encounter: Payer: Self-pay | Admitting: Internal Medicine

## 2011-01-13 ENCOUNTER — Ambulatory Visit: Payer: Self-pay | Admitting: Internal Medicine

## 2011-01-22 LAB — CBC
HCT: 36.8 % (ref 36.0–46.0)
Hemoglobin: 12.5 g/dL (ref 12.0–15.0)
RBC: 4.46 MIL/uL (ref 3.87–5.11)

## 2011-01-22 LAB — COMPREHENSIVE METABOLIC PANEL
ALT: 15 U/L (ref 0–35)
Alkaline Phosphatase: 79 U/L (ref 39–117)
CO2: 23 mEq/L (ref 19–32)
Chloride: 100 mEq/L (ref 96–112)
GFR calc non Af Amer: 49 mL/min — ABNORMAL LOW (ref >60)
Glucose, Bld: 99 mg/dL (ref 70–99)
Potassium: 4.1 mEq/L (ref 3.5–5.1)
Sodium: 133 mEq/L — ABNORMAL LOW (ref 135–145)
Total Bilirubin: 0.3 mg/dL (ref 0.3–1.2)
Total Protein: 6 g/dL (ref 6.0–8.3)

## 2011-01-22 LAB — POCT CARDIAC MARKERS
CKMB, poc: <1 ng/mL — ABNORMAL LOW (ref 1.0–8.0)
Troponin i, poc: <0.05 ng/mL (ref 0.00–0.09)

## 2011-01-22 LAB — DIFFERENTIAL
Basophils Absolute: 0 10*3/uL (ref 0.0–0.1)
Basophils Relative: 1 % (ref 0–1)
Eosinophils Absolute: 0.1 10*3/uL (ref 0.0–0.7)
Monocytes Relative: 7 % (ref 3–12)
Neutrophils Relative %: 63 % (ref 43–77)

## 2011-01-22 LAB — PROTIME-INR: INR: 0.9 (ref 0.00–1.49)

## 2011-01-23 ENCOUNTER — Ambulatory Visit: Payer: Self-pay | Admitting: Internal Medicine

## 2011-02-20 ENCOUNTER — Ambulatory Visit (INDEPENDENT_AMBULATORY_CARE_PROVIDER_SITE_OTHER): Payer: Medicare Other | Admitting: Internal Medicine

## 2011-02-20 ENCOUNTER — Encounter: Payer: Self-pay | Admitting: Internal Medicine

## 2011-02-20 VITALS — BP 110/70 | HR 74 | Temp 98.2°F | Resp 14 | Wt 194.0 lb

## 2011-02-20 DIAGNOSIS — J441 Chronic obstructive pulmonary disease with (acute) exacerbation: Secondary | ICD-10-CM

## 2011-02-20 DIAGNOSIS — F3289 Other specified depressive episodes: Secondary | ICD-10-CM

## 2011-02-20 DIAGNOSIS — F329 Major depressive disorder, single episode, unspecified: Secondary | ICD-10-CM

## 2011-02-20 DIAGNOSIS — F1010 Alcohol abuse, uncomplicated: Secondary | ICD-10-CM

## 2011-02-20 DIAGNOSIS — IMO0001 Reserved for inherently not codable concepts without codable children: Secondary | ICD-10-CM

## 2011-02-20 NOTE — Patient Instructions (Signed)
Take 1-1 mixture of Eucerin lotion and Cortaid maximum strength and 5 drops of menthol Applied here skin liberally twice a day

## 2011-02-24 ENCOUNTER — Other Ambulatory Visit: Payer: Self-pay | Admitting: *Deleted

## 2011-02-24 MED ORDER — PANTOPRAZOLE SODIUM 40 MG PO TBEC: 40.0000 mg | DELAYED_RELEASE_TABLET | Freq: Every day | ORAL | Status: DC

## 2011-02-28 NOTE — Discharge Summary (Signed)
NAMEKAMEAH, RAWL NO.:  [id number]   MEDICAL RECORD NO.:  [id number]          PATIENT TYPE:  INP   LOCATION:  1605                         FACILITY:  [facility]   PHYSICIAN:  [name], M.D.    DATE OF BIRTH:  06/27/1946   DATE OF ADMISSION:  08/07/2007  DATE OF DISCHARGE:  08/10/2007                               DISCHARGE SUMMARY   ADMITTING DIAGNOSES:  1. Osteoarthritis right hip.  2. Allergic rhinitis.  3. Diverticulosis.  4. Hypertension.  5. Depression.  6. Asthma.  7. Gastroesophageal reflux disease.  8. History of panic attacks.  9. Preoperative UTI treated preoperatively.   DISCHARGE DIAGNOSES:  1. Osteoarthritis right hip status post right total hip replacement      arthroplasty.  2. Postop blood loss anemia.  3. Hyponatremia improved.  Remaining discharge diagnoses admitting      diagnoses.  4. Allergic rhinitis.  5. Diverticulosis.  6. Hypertension.  7. Depression.  8. Asthma.  9. Gastroesophageal reflux disease.  10.History of panic attacks.  11.Preoperative UTI treated preoperatively.   PROCEDURE:  On August 07, 2007, right total hip surgery Dr. Lequita Halt,  assistant Avel Peace PA-C.   ANESTHESIA:  General.   CONSULTS:  None.   BRIEF HISTORY:  Dedra Skeens is a 65 year old female with rapidly progressive  arthritis of right hip to end-stage to point where now she is bone-on-  bone, successful left total hip now presents for right total hip.   LABORATORY DATA:  Preop CBC showed hemoglobin 12.8, hematocrit 37.9,  white cell count 8.1 postop hemoglobin 11.6 drifted down to 10.2 last  H&H 9.7 and 29.0.  PT/PTT preop 12.5 and 20 respectively.  INR 0.9.  Serial protimes are as follows.  PT/INR 20.0 and 1.7.  Chem panel on  admission, slightly low sodium preop, remaining Chem panel within normal  limits.  Serial B-mets are as follows.  Sodium did come back up to 138.  Preop UA:  Positive nitrites, small leukocyte esterase, few  epithelials,  11-20 white cells, 0-2 red cells, many bacteria.  This was treated  preoperatively.   Chest X-Ray: August 02, 2007 stable exam, no acute chest process.  Portable hip and pelvis postop right hip prosthesis without acute  complications.   EKG:  April 25, 2007, normal sinus rhythm, incomplete right bundle branch  block confirmed.   HOSPITAL COURSE:  The patient was admitted to [facility],  tolerated procedure well, later transferred to the orthopedic floor,  started on PCA and p.o. analgesic for pain control following surgery and  24 hours postop IV antibiotics.  Did pretty well on the evening of  surgery and the morning of day #1.  She did have hypertension.  We  resumed her blood pressure medications with parameters.  She had a  little bit of low sodium preop that was noted postop.  It did improve.  Resumed her home medications.  Had excellent urinary output, started  getting up out of bed.  By day #2, she was doing better.  It was tough  getting up, but when she was up she started improving with her  mobility.  Dressing changed on day two, incision looked good.  Sodium was up.  Blood pressure came back up.  By day #3, she was more mobile, wound was  healing well.  She was discharged home.   DISCHARGE/PLAN:  1. The patient was discharged home August 10, 2007.  2. Discharge diagnoses, please see above.  3. Discharge medications Percocet, Robaxin, Coumadin.  4. Follow-up 2 weeks.   DISCHARGE INSTRUCTIONS:  1. Activity:  Partial weightbearing, 25-50% by lower extremity.  Hip      precautions total hip protocol.  2. Diet:  Low-sodium, heart-healthy diet   DISPOSITION:  Home.   CONDITION ON DISCHARGE:  Improved      Alexzandrew L. Perkins, P.A.C.      [name], M.D.  Electronically Signed    ALP/MEDQ  D:  09/24/2007  T:  09/24/2007  Job:  [id number]   cc:   [name], M.D.  Fax: [phone]   Stacie Glaze, MD  [address] [place]  [place] [address]

## 2011-02-28 NOTE — Op Note (Signed)
NAMEHALCYON, HECK NO.:  [id number]   MEDICAL RECORD NO.:  [id number]          PATIENT TYPE:  INP   LOCATION:  NA                           FACILITY:  [facility]   PHYSICIAN:  [name], M.D.    DATE OF BIRTH:  1946-04-03   DATE OF PROCEDURE:  08/07/2007  DATE OF DISCHARGE:                               OPERATIVE REPORT   PREOPERATIVE DIAGNOSIS:  Osteoarthritis, right hip.   POSTOPERATIVE DIAGNOSIS:  Osteoarthritis, right hip.   PROCEDURE:  Right total hip arthroplasty.   SURGEON:  [name], M.D.   ASSISTANT:  Alexzandrew L. Perkins, P.A.C.   ANESTHESIA:  General.   ESTIMATED BLOOD LOSS:  400 mL.   DRAINS:  Hemovac x1.   COMPLICATIONS:  None.   DISPOSITION:  Stable to recovery.   CLINICAL NOTE:  Aimee Jones is a 65 year old female who has rapidly progressive  arthritis of the right hip to end stage point now where she is bone-on-  bone with severe pain and dysfunction.  She has had a previous  successful left total hip and presents now for right total hip  arthroplasty.   PROCEDURE IN DETAIL:  After successful initiation of general anesthetic,  the patient was placed in left lateral decubitus position with the right  side up and held with the hip positioner.  The right lower extremity was  isolated from her perineum with plastic drapes and prepped and draped in  the usual sterile fashion.  A short posterolateral incision is made with  a 10 blade through the subcutaneous tissue to the level of the fascia  lata which was incised in line with the skin incision.  The sciatic  nerve was palpated and protected and the short rotators isolated off the  femur.  A capsulectomy is performed and the hip is dislocated.  The  center of femoral head is marked and the trial prosthesis placed such  that the center of the trial head corresponds to the center of the  native femoral head.  An osteotomy line was marked on the femoral neck  and an osteotomy made with an  oscillating saw.  The femur was retracted  anteriorly to gain acetabular exposure.   Acetabular retractors were placed and labrum and osteophytes removed.  Reaming starts at 45 mm in coursing increments of 2 to 49 mm and a 50 mm  Pinnacle acetabular shell was placed in an anatomic position and  transfixed with two dome screws.  The apex hole eliminator is placed and  a 36 mm neutral Ultramet metal liner is placed.   On the femoral side, we prepared with canal finder and irrigation.  Axial reaming is performed to 13.5 mm, proximal reaming to an 18B, and  the sleeve machined to a small.  An 18D small trial sleeve is placed, 18  x 13 stem, 36 plus 8 neck, about 5 degrees beyond native anteversion.  A  36 plus 0 head is placed.  The hip is reduced with outstanding  stability.  There is full extension, full external rotation, 70 degrees  flexion, 40 degrees adduction, 90 degrees internal rotation, and 90  degrees of flexion and 70 degrees of internal rotation.  By placing the  right leg on top of the left, it felt as though the leg lengths were  equal.  The hip was then dislocated and the trials were removed.  The  permanent 18D small sleeve is placed, 18 x 13 stem, 36 plus 8 neck about  5 degrees beyond native anteversion.  It looked there may have been a  tiny crack in the calcar when I impacted the stem.  We placed a 36 plus  0 head and reduced the hip with the same stability parameters.  I then  extended the incision distally about another inch.  I cut through the  subcutaneous tissue to the fascia lata which I incised in line with the  incision.  I incised the fascia of the vastus lateralis, inspected the  femur, and it did not appear that the split went below the lesser  trochanter.  I still put a Dahl-Miles cable below the lesser trochanter  to prevent any propagation of the split.  The cable was tightened down  and clamped and cut.  We then copiously irrigated and closed the vastus   lateralis fascia with interrupted #1 Vicryl, reattached the short  external rotators to the femur through drill holes with Ethibond.  The  fascia lata was closed over a Hemovac drain with interrupted #1 Vicryl,  subcu closed with #1 and 2-0 Vicryl, subcuticular running 4-0 Monocryl.  The drain was hooked to suction, the incision cleaned and dried, and  Steri-Strips and a bulky sterile dressing applied.  She was placed into  a knee immobilizer, awakened, and transferred to recovery in stable  condition.      [name], M.D.  Electronically Signed     FA/MEDQ  D:  08/07/2007  T:  08/08/2007  Job:  [id number]

## 2011-02-28 NOTE — H&P (Signed)
Aimee Jones NO.:  [id number]   MEDICAL RECORD NO.:  [id number]          PATIENT TYPE:  INP   LOCATION:  NA                           FACILITY:  [facility]   PHYSICIAN:  [name], M.D.    DATE OF BIRTH:  1945/12/10   DATE OF ADMISSION:  08/07/2007  DATE OF DISCHARGE:                              HISTORY & PHYSICAL   DATE OF OFFICE VISIT HISTORY AND PHYSICAL:  August 02, 2007.   CHIEF COMPLAINT:  Right hip pain.   HISTORY OF PRESENT ILLNESS:  The patient is a 65 year old female who has  been seen by Dr. Lequita Halt for progressive worsening problems with her  right hip.  She had a scheduled total hip several months ago, but had it  postponed.  She had to have a rotator cuff surgery done.  She is  recovered from that, and now due to the progressive arthritis in her  hip, she elects to proceed with total hip replacement.  Risks and  benefits have been discussed, and she elects to proceed with surgery.   ALLERGIES:  PENICILLIN.  ADHESIVE.   PLEASE NOTE, THE PATIENT IS ABLE TO USE STERI-STRIPS WITHOUT A PROBLEM.   CURRENT MEDICATIONS:  1. Imipramine.  2. Lipitor.  3. Xenical.  4. Premarin.  5. Alprazolam.  6. Flonase.  7. Cozaar.  8. Protonix.  9. Celebrex.  10.Vicoprofen.  11.Symbicort.   PAST MEDICAL HISTORY:  1. Allergic rhinitis.  2. Diverticulosis.  3. Hypertension.  4. Depression.  5. Osteoarthritis.  6. Asthma.  7. Gastroesophageal reflux disease.  8. History of panic attacks.  9. A preoperative urinary tract infection treated preoperatively.   PAST SURGICAL HISTORY:  1. Left hip replacement.  2. Shoulder surgery.  3. Partial hysterectomy.  4. Septoplasty.  5. Benign lumpectomy in the breast.  6. Removal of ganglion cyst on the foot.   SOCIAL HISTORY:  Married.  Retired.  Nonsmoker.  Two drinks a week.  Two  children.  Husband will be assisting with care after surgery.   FAMILY HISTORY:  Heart disease in her parents and  siblings.  Mom with a  stroke.  Also a family history of osteoarthritis.   REVIEW OF SYSTEMS:  GENERAL:  No fevers, chills, or night sweats.  NEUROLOGIC:  No seizures, syncope or paralysis.  RESPIRATORY:  No  shortness of breath, productive cough or hemoptysis.  CARDIOVASCULAR:  No chest pain, angina or orthopnea.  GI:  No nausea, vomiting, diarrhea  or constipation.  GU:  No dysuria, hematuria or discharge.  MUSCULOSKELETAL:  Right hip.   PHYSICAL EXAMINATION:  VITAL SIGNS:  Pulse 74, respirations 12, blood  pressure 144/84.  GENERAL:  A 65 year old black female, well-nourished, well-developed, in  no acute distress.  She is alert, oriented and cooperative.  Accompanied  by her husband.  HEENT:  Normocephalic, atraumatic.  Pupils are round and reactive.  Oropharynx is clear.  Extraocular movements intact.  NECK:  Supple.  CHEST:  Clear anterior and posterior chest walls.  HEART:  Regular rate and rhythm.  No murmur.  S1 and S2 noted.  ABDOMEN:  Soft, nontender.  Bowel sounds are present.  RECTAL/BREAST/GENITALIA:  Not done.  Not pertinent to present illness.  EXTREMITIES:  Right hip shows flexion of 95 degrees.  Internal rotation  10, external rotation 20, abduction 20.   IMPRESSION:  1. Osteoarthritis, right hip.  2. Allergic rhinitis.  3. Diverticulosis.  4. Hypertension.  5. Depression.  6. Asthma.  7. Gastroesophageal reflux disease.  8. History of panic attacks.  9. Preoperative UTI triggered preoperatively.   PLAN:  The patient was admitted to [facility] to undergo a  right total hip replacement arthroplasty.  Surgery will be performed by  [name].      Aimee Jones, P.A.C.      [name], M.D.  Electronically Signed    Aimee Jones  D:  08/06/2007  T:  08/07/2007  Job:  [id number]   cc:   Stacie Glaze, MD  [address] [place]  [place] [address]

## 2011-03-03 NOTE — Assessment & Plan Note (Signed)
[facility]                             PULMONARY OFFICE NOTE   ELTA, ANGELL                   MRN:          [id number]  DATE:09/05/2006                            DOB:          06-Oct-1946    SUBJECTIVE:  Ms. Heffern comes in today for a followup of her upper  airway dysfunction and chronic cough.  She has been having for the last  1 week to 10 days, increasing wheezing as well as shortness of breath  and blowing scant amounts of green material from her nose, and also  coughing up, again, very scant amount of mucus form her chest.  She  thinks that she does not feel well.  She has had no fevers, chills, or  sweats.  She continues to feel that she needs some type of inhalational  therapy, even though we have done very extensive testing with no  physiologic basis for this treatment.  She is getting somewhat  frustrated with her cough and upper airway symptoms.   PHYSICAL EXAM:  BP is 130/80, pulse 90, temperature 97.8, weight is 191  pounds.  O2 saturation on room air is 92%.  CHEST:  Totally clear to auscultation.  However, there is very loud  upper airway pseudo-wheezing with transmission to the lung bases.  The  wheezing in the lung fields practically totally resolves with pursed-lip  breathing.  CARDIAC:  Regular rate and rhythm.   IMPRESSION:  Upper airway dysfunction and chronic cough that I still  believe is due to ongoing laryngopharyngeal reflux.  The patient has  decreased her proton pump inhibitor back to 1 time a day.  She has had a  very extensive workup that has included a negative methacholine  challenge, normal spirometry, negative CT scan of the sinuses, negative  high-resolution CT, negative allergy evaluation, and a repeat pH probe  study on medication that was unimpressive.  She has had a pH probe study  this past summer by Dr. Christella Hartigan that was very abnormal while not on  therapy.  She has also had gastritis noted on  upper endoscopy most  recently.  There is absolutely no evidence for airways disease, nor any  type of chronic sinusitis.  It would be of no benefit to place this  patient on inhalers, and in fact would make the problem worse in light  of the aerosol or dry powder effect on the posterior pharynx.  I suspect  the patient has a upper respiratory infection at this point in time.  I  will go ahead and give her 5 days of Doxycycline empirically, in case  she does have a bacterial component.  I really think that it would be in  her best interest to be referred to the voice disorder center at [facility] where they have a multi-specialty approach to upper airway  dysfunction.  The patient would like to discuss this with Dr. Lovell Sheehan.   PLAN:  1. Would not recommend any types of inhalers for this patient, since      there is no evidence of airways disease.  2. Increase Protonix to  b.i.d. dosing.  3. Doxycycline 100 mg b.i.d. for the next 5 days.  4. I would recommend referral to the voice disorder center at [facility] for a workup of her upper airway dysfunction.  The patient      will discuss this with Dr. Lovell Sheehan.     Barbaraann Share, MD,FCCP  Electronically Signed    [facility]/MedQ  DD: 09/05/2006  DT: 09/05/2006  Job #: [id number]   cc:   Stacie Glaze, MD

## 2011-03-03 NOTE — Assessment & Plan Note (Signed)
[facility]                           GASTROENTEROLOGY OFFICE NOTE   Aimee Jones, Aimee Jones                   MRN:          [id number]  DATE:05/22/2006                            DOB:          [birth year]    REASON FOR CONSULTATION:  Laryngopharyngeal reflux?   ASSESSMENT:  A 65 year old white woman who has had some globus sensation but  no dysphagia.  She has been diagnosed with laryngopharyngeal reflux by Dr.  Gerilyn Jones and Dr. Shelle Jones.  She has been on a proton pump inhibitor on a b.i.d.  dosing since March.  She still has recurrent pulmonary symptoms.  Thus, it  is not clear that she really has laryngopharyngeal reflux or, if she does,  it may not be due to acid but it may be due to nonacid reflux.   RECOMMENDATIONS AND PLAN:  1.  Schedule upper GI endoscopy and Bravo pH probe placement later this      month while she is on b.i.d. Protonix.  If she makes no acid and has no      significant reflux, I think the diagnosis of laryngopharyngeal reflux is      in doubt.  Admittedly, this is a difficult diagnosis to make in some      people.  2.  She needs a screening colonoscopy at some point.  3.  Further plans pending clinical course and this analysis.   HISTORY:  A 65 year old woman who has had a diagnosis of asthma made in her  30s when she lived in [place].  She tells me she had allergy testing and was  told she had asthma.  Subsequently at some point she moved here.  Dr. Shelle Jones  has been following her and she has, according to his last impression,  recurrent sinopulmonary infection and inflammation that he thought was due  to laryngopharyngeal reflux.  She had been Aimee Cowboy, MD, and underwent pH  probe testing while she was on no medication.  The diagnosis was moderately  severe laryngopharyngeal reflux correlated with coughing on at least one  episode.  I have reviewed the tracing.  She has had some reflux on this  tracing.  She had a  DeMeester score of 20 with normals less than 14.72.  she  did have some proximal reflux on a 15 cm probe.  She had two episodes of  cough associated with the study.  Review of the tracings does suggest that  these were associated with gastroesophageal reflux that was acidic at the  time.  She had been tried on Zegerid in the past and that was switched,  presumably because it was ineffective.  She is now on doxycycline for  another episode of bronchitis.  She has had her inhalers discontinued by Dr.  Shelle Jones though she feels like they were helpful, particularly Spiriva.  There  has been some hoarseness, congestion, sore throat as well.  She is having  normal spirometry, and methacholine challenge has been negative.  She has  seen Dr. Maple Jones in allergy consultation, and he has diagnosed allergic  rhinitis, persistent head congestion, with temporarily having relief with  septoplasty.  She has some occasional gas and bloating that has improved.  She gained some weight after starting Zegerid.  That may have led to some of  the discontinuation.   PAST MEDICAL HISTORY:  1.  Panic attacks.  2.  Hypertension.  3.  Chronic recurrent bronchitis, etiology unclear it seems.  4.  Osteoarthritis, status post left hip replacement.  5.  Anxiety as well as the panic attacks.  6.  Depression.  7.  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  8.  Partial hysterectomy.  9.  Left hip replacement was by Aimee Gross, MD  10. Fibromyalgia is a problem as well.   MEDICATIONS:  1.  Vicoprofen tablets 7.5/200 mg p.r.n.  2.  Celebrex 200 mg daily.  3.  Flonase 50 mcg daily.  4.  Imipramine 150 mg daily.  5.  Xanax 0.25 mg p.r.n. or once a day.  6.  Lipitor 10 mg once a day.  7.  Premarin 0.625 mg daily.  8.  Cozaar 50 mg daily.  9.  Protonix 40 mg b.i.d.  She has been taking this before breakfast and at      bedtime.  She is instructed to take it before supper in the evening to      increase the  effectiveness.  10. Metoclopramide 10 mg q.a.c. and h.s.  11. Hydro-DP syrup CYP p.r.n.  12. Vitamin E 200 units daily.  13. Multivitamin daily.  14. Aspirin 81 mg daily.  15. Doxycycline 100 mg b.i.d. currently.  16. Lunesta 2 mg p.r.n.  17. Albuterol inhaler two puffs twice a day as needed.   DRUG ALLERGIES:  PENICILLIN caused a rash.  LATEX causes redness with  contact for long periods of time.   FAMILY HISTORY:  Positive for heart disease, diabetes, alcoholism in first  and second-degree relatives.  No colon cancer or GI problems reported.   SOCIAL HISTORY:  She is married.  She is on Medicare with [employer]  supplemental.  She has never smoked.  Some alcohol.   REVIEW OF SYSTEMS:  See my medical history form for full details.  She  claims she is anxious over being sick all the time.   PHYSICAL EXAMINATION:  GENERAL:  A pleasant, well-developed, well-nourished  white woman in no acute distress.  VITAL SIGNS:  Height 5 feet 7 inches, weight 187 pounds.  Blood pressure  158/70, pulse 100.  HEENT:  Eyes anicteric.  ENT:  Normal mouth and pharynx.  NECK:  Supple with no mass.  CHEST:  Some scattered wheezes in the expiratory phase with fair to good air  movement.  CARDIAC:  S1, S2.  I hear no murmurs or gallops.  ABDOMEN:  Soft, nontender, no organomegaly.  EXTREMITIES:  No edema.  LYMPHATIC:  No neck or supraclavicular nodes.  SKIN:  No rash.  NEUROLOGIC:  She is alert and oriented x3.   I have reviewed Drs. Young's and Clance's notes as well as the pH probe  study.   ADDITIONAL THOUGHTS:  I need to quantify her caffeine consumption.  We  talked about potentially discontinuing the metoclopramide as that is of  questionable benefit at this time as well, but at this time she will stay on  it.  There are possible long-term side effects that will need to be  reviewed.  Aimee Boop, MD, Aimee Jones  CEG/MedQ  DD:  05/22/2006  DT:   05/23/2006  Job #:  [id number]   cc:   Aimee Share, MD, [facility]  Aimee Glaze, MD  Aimee Cowboy, MD

## 2011-03-03 NOTE — Op Note (Signed)
NAMEMRYTLE, BENTO NO.:  [id number]   MEDICAL RECORD NO.:  [id number]          PATIENT TYPE:  AMB   LOCATION:  ENDO                         FACILITY:  [facility]   PHYSICIAN:  Iva Boop, MD,FACGDATE OF BIRTH:  [birth year]   DATE OF PROCEDURE:  06/11/2006  DATE OF DISCHARGE:  06/11/2006                                 OPERATIVE REPORT   GASTROENTEROLOGIST:  Iva Boop, MD,FACG.   PROCEDURE:  A 48-hour Bravo pH study.   INDICATIONS:  Pulmonary symptoms with chronic recurrent cough, question of  laryngopharyngeal reflux, previous dual-sensor nasal pH probe testing has  suggested acid reflux (off medication).   NOTE:  The Bravo pH capsule tracings were processed by the endoscopy nursing  staff, and the computer-generated report is reviewed by me.  It is adequate  for interpretation.   FINDINGS:  1. Day 1 results show 4 total reflux episodes, 1 postprandial, 3 related      to a meal.  All were upright.  Total time of pH less than 4 in the      esophagus was 0.4%, total DeMeester score 1.6.  2. Day 2 results showed 23 refluxes.  All were postprandial.  Fraction of      time:  Less than 4.8%.  DeMeester score of 3.3.  3. Total analysis showed 27 reflux episodes, with total DeMeester score of      2.6, with fraction of pH less than 4.6%.   IMPRESSION:  Overall these reflux episodes were mainly meal related.  There  was no correlation with episodes of cough in the reflux episodes that  occurred.   At this time, I think she has very good acid suppression, with only minimal  reflux changes noted.  There is not good correlation with her pulmonary  symptoms at this time.  She still has some recurrent pulmonary symptoms, and  I think her cough and breathing difficulty are probably multifactorial.  She  does indicate (I have discussed these results with her) that she feels  better on a twice daily proton pump inhibitor than without proton pump  inhibitor  therapy.   RECOMMENDATIONS AND PLAN:  Overall plans per [name] at this time.  I  think it is reasonable for her to stay on a proton pump inhibitor.  Whether  she needs twice daily dosing is not entirely clear to me.  It is reasonable,  but it also may be reasonable to back off to once-a-day dosing for cost  effectiveness, if needed.  She plans to follow up with [name] to  determine the next step.      Iva Boop, MD,FACG  Electronically Signed     CEG/MEDQ  D:  06/21/2006  T:  06/21/2006  Job:  [id number]   cc:   Barbaraann Share, MD,FCCP  Stacie Glaze, MD  [name], MD

## 2011-03-03 NOTE — Discharge Summary (Signed)
[facility]  Patient:    Aimee Jones, Aimee Jones                   MRN: [id number] Adm. Date:  [id number] Disc. Date: [id number] Attending:  Loanne Drilling Dictator:   Ralene Bathe, P.A.C. CC:         Stacie Glaze, M.D. [facility]   Discharge Summary  ADMISSION DIAGNOSES: 1. End-stage osteoarthritis of left hip. 2. Generalized osteoarthritis. 3. Fibromyalgia. 4. Anxiety/depression. 5. Recent upper respiratory infection.  DISCHARGE DIAGNOSES: 1. End-stage osteoarthritis of left hip. 2. Generalized osteoarthritis. 3. Fibromyalgia. 4. Anxiety/depression. 5. Recent upper respiratory infection. 6. Status post left total hip arthroplasty. 7. Mild postoperative hemorrhagic anemia, not requiring transfusion.  OPERATIONS:  Left total hip arthroplasty.  SURGEON:  Dr. Homero Fellers Aluisio.  ASSISTANT:  Avel Peace, P.A.C.  ANESTHESIA:  General.  HISTORY OF PRESENT ILLNESS:  Aimee Jones is a 65 year old female who has had end stage osteoarthritis of her left hip.  She has progressed to where she is having significant pain requiring narcotic analgesics.  She has rest pain with any range of motion.  She was found to have end stage osteoarthritis, and therefore a total hip arthroplasty was indicated.  Risks and benefits were discussed with the patient.  She was in agreement and wished to proceed.  HOSPITAL COURSE:  The patient is admitted.  Underwent above mentioned procedure and tolerated this well.  All appropriate IV antibiotics and analgesics were provided.  Postoperatively, the patient was placed on touchdown weightbearing to the operative extremity.  She was also placed on protocol with physical therapy.  She was placed on DVT and PE prophylaxis with Coumadin.  This is for a total of 3 weeks.  She progressed well with physical. Hemodynamically, she remained stable.  She remained afebrile throughout her hospitalization.  As she began working with therapy she  reached her discharge goals by postoperative day #4.  On that date, her incision was dry, she was neurovascularly intact.  Her pain was controlled on p.o. analgesics.  Overall, the patient has done extremely well and had met discharge goals, and was ready for discharge to home with home health PT and RN.  LABORATORY DATA:  Admission hemoglobin 13.2, postoperatively 10.1, 9.9, and 9.2.  Coagulation times followed by pharmacy on DVT and PE prophylaxis with Coumadin.  Chemistries on admission within normal limits.  Postoperatively, mildly elevated glucose at 134, however, was on D5 IV fluids.  Urinalysis was negative, and blood type showed O+.  Radiology shows mild degenerative arthritic changes of the left hip.  Chest x-ray not in chart.  EKG shows normal sinus rhythm.  Exercise stress test from August 1995, is placed in chart as well.  Her old EKG showed no changes.  CONDITION ON DISCHARGE:  Stable and improved.  DISCHARGE PLAN:  The patient is being discharged to home with family.  Home health PT and RN arranged through [facility].  FOLLOWUP:  In our office around 2-1/2 weeks postoperatively.  Call for a time.  Continue total hip replacement protocol while touchdown weightbearing.  DISCHARGE MEDICATIONS: 1. She can resume her home medications except for Vioxx, avoid aspirin, and    Advil related products. 2. Percocet 5 mg #40 one or two q.4-6h. p.r.n. pain. 3. She has Zanaflex at home. 4. Restoril 30 mg one q.h.s. p.r.n. sleep. 5. Coumadin per outpatient pharmacy. DD:  11/02/00 TD:  11/03/00 Job: [id number]

## 2011-03-03 NOTE — Op Note (Signed)
[facility]  Patient:    Aimee Jones, Aimee Jones                   MRN: [id number] Proc. Date: 10/29/00 Adm. Date:  [id number] Attending:  [name] V                           Operative Report  PREOPERATIVE DIAGNOSIS:  Osteoarthritis left hip.  POSTOPERATIVE DIAGNOSIS:  Osteoarthritis left hip.  PROCEDURE:  Left total hip arthroplasty.  SURGEON:  [name], M.D.  ASSISTANT:  Alexzandrew L. Perkins, P.A.-C.  ANESTHESIA:  General.  ESTIMATED BLOOD LOSS:  500 cc.  DRAINS:  Hemovac x 1.  COMPLICATIONS:  None.  CONDITION:  Stable with recovery.  BRIEF CLINICAL NOTE:  Aimee Jones is a 65 year old female with a 1-1/2 to 2-year onset of progressively increasing left hip pain.  Dr. Kerrin Champagne has done extensive back workup, which did not account for her growing pain.  X-rays have shown progressively increasing degenerative change, to the point to where she is essentially bone-on-bone superiorly.  She has had progressive pain including night pain, and presents now for left total hip arthroplasty.  PROCEDURE IN DETAIL:  After successful administration of general anesthetic, the patient was placed in the right lateral decubitus position with left side up and hip positioned.  The left lower extremity was isolated from perineum with plastic drapes and prepped and draped in the usual sterile fashion.  A standard posterolateral incision was made.  The skin was cut with a #10 blade through subcutaneous tissue to the level of fascia lata, which was incised in line with the skin incision.  Short external rotators were isolated off the femur.  Capsulectomy performed.  Hip is dislocated in the center of the femoral head and marked.  She had chondromalacia throughout, with focal areas of complete cartilage loss.  The center head is marked and then the trial prosthesis is placed per preoperative template, which had to be centered over the trial head  corresponding to the center of her native femoral head. Osteotomy line is marked with an oscillating saw and the osteotomy made.  The femur is then retracted anteriorly and anterior capsule removed.  Acetabular exposure was obtained and then the labrum removed.  The acetabular reaming started at 45 coursing up to a 49, and then a 50 mm acetabular shell was impacted.  It had excellent fit.  It was transfixed with a gnome screw, and then a 28 mm neutral liner is placed.  Femoral preparation at this stage: first with the canal finder and then the starter reamers.  The canal was irrigated and then axial reamings were performed up to 13.5, which had a very tight fit.  The proximal reaming was performed with an 18D and the sleeve up to a large.  The 18D large sleeve is impacted into the proximal femur, and then the trial 18 x 13 stem with a 36 plus 8 neck is placed.  It matches her native anteversion and then the trial to an 8 plus 0 head is placed.  Reduction is performed and she had excellent stability, full extension, flexion and rotation to 70 degrees flexion, 40 degrees adduction, 90 degrees internal rotation and 90 degrees flexion to 70 degrees internal rotation.  Soft tissue tension was excellent. At this point all the trials were removed, and then the permanent prostheses placed.  The apex hole eliminator was placed into the acetabular shell,  and a permanent 28 mm neutral [place] liner was placed.  The femur is prepared with the permanent 18D large sleeve and the 18 x 13 stem with a 36 plus 8 neck to match her native anteversion.  Once this was impacted, the permanent 28 plus 0 head is placed, the hip reduced, and the same stability parameters noted.  The wound was then copiously irrigated with antibiotic solution and the short external rotator is reattached to its femur through drill holes.  Fascia lata and fascia of the gluteus maximus are closed over one layer of the Hemovac drain  with interrupted #1 Vicryl.  Subcutaneous tissue is closed in two layers with #1 and 2-0 Vicryl, and then subcuticular closure with a running 4-0 Monocryl.  The incision is cleaned and dried, and Steri-Strips with a bulky sterile dressing applied.  The patient was then awakened and transported to recovery in stable condition. DD:  10/29/00 TD:  10/28/00 Job: [id number]

## 2011-03-03 NOTE — H&P (Signed)
[facility]  Patient:    Aimee Jones, Aimee Jones                     MRN: [id number] Adm. Date:  10/29/00 Attending:  [name], M.D. Dictator:   Ralene Bathe, P.A. CC:         Stacie Glaze, M.D. [facility]   History and Physical  DATE OF BIRTH:  12/03/1945.  DATE OF HISTORY AND PHYSICAL:  October 22, 2000.  CHIEF COMPLAINT:  Left hip pain.  HISTORY OF PRESENT ILLNESS:  Aimee Jones is a 65 year old female who is well-known to our practice and has been followed for some time now with her left hip.  This stems from an injury where she fell on the ice ______ about a year and a half ago, landing onto her hip.  She initially had pain to her lateral hip and buttocks area, which has progressively increased to involve the groin.  She now is having radiation down the medial aspect of her thigh. She initially had an extensive back workup by Dr. Kerrin Champagne and eventually was found to have most of her pain stemming from her hip.  He sent her for intra-articular injections on three separate occasions; first two provided some relief for several months, however, the third lasted about a week.  Pain has become progressively more debilitating.  She is having difficulty with ADLs and pain relief.  She was sent for an MRI which did show irregularity of the anterolateral labrum and a right joint effusion; she was then referred to Dr. [name] for evaluation for possible arthroscopy versus total hip arthroplasty.   On her office visit, her x-rays showed progressive and concentric changes with essentially bone on bone in the joint space of the left hip.  This showed significant progression from her previous hip films a year ago.  With her significant pain and the bone-on-bone findings, it is felt that an arthroscopy at this point would not provide adequate relief and total hip arthroplasty is indicated.  Risks and benefits were discussed at patient at length.  She and  her husband have discussed the procedure and wish to proceed with total hip arthroplasty.  PAST MEDICAL HISTORY  1. Fibromyalgia.  2. Osteoarthritis.  3. Anxiety/depression.  4. Patient is currently being treated for upper respiratory infection on     Tequin by Dr. Titus Dubin. Hopper.  PAST SURGICAL HISTORY  1. Lumpectomy of breast.  2. Septoplasty.  3. Partial hysterectomy.  ALLERGIES:  PENICILLIN causes a rash.  MEDICATIONS  1. Imipramine 150 mg q.h.s.  2. Xanax 0.5 mg q.d.  3. Lipitor 10 mg q.d.  4. Premarin 0.625 mg q.d.  5. Vicoprofen, which has stopped prior to surgery; she is currently just     taking Vicodin.  6. Claritin 10 mg q.d.  7. Flonase p.r.n.  8. Advair 100/50 mg one puff q.d.  9. Zanaflex 2 mg q.d. p.r.n. 10. Vioxx 12.5 mg q.d., which she stopped.  SOCIAL HISTORY:  She does not smoke.  Uses occasional alcohol.  Lives in a one-story home with four steps into the usual entrance.  Her sister is Aimee Jones, who is an employee in our office.  She does have family to assist her postoperatively.  PRIMARY CARE PHYSICIAN:  Dr. Stacie Glaze.  REVIEW OF SYSTEMS:  RESPIRATORY:  Patient has had recent upper respiratory infection involving some increasing nasal stuffiness and drainage.  She was seen and evaluated by  her medical physician in ______ of ER and placed on Tequin on October 21, 2000; her symptoms are clearing.  Denies any chills and night sweats.  No shortness of breath or productive cough except for as above and no orthopnea.  Patient denies any chest pain.  GI:  No nausea, vomiting, constipation, melena or bloody stools.  GENITOURINARY:  No dysuria or hematuria.  MUSCULOSKELETAL:  As pertinent to present illness.  PHYSICAL EXAMINATION  GENERAL:  Well-developed, well-nourished 65 year old female.  She walks with an antalgic gait.  VITAL SIGNS:  Blood pressure is 150/96.  Respirations are 12.  Pulse is 88.  HEENT:  Normocephalic.  Extraocular  motions are intact.  NECK:  Supple.  No lymphadenopathy and no carotid bruits appreciated on exam.  CHEST:  Clear to auscultation bilaterally.  No rales or rhonchi.  HEART:  Regular rate and rhythm.  No murmurs, gallops, rubs, heaves or thrills.  ABDOMEN:  Positive bowel sounds.  Soft, nontender.  EXTREMITIES:  Left hip is evaluated and shows that she has range of motion to about 90 degrees of flexion, internal rotation 20, external rotation 30 and abduction of 40.  She has a lot of pain with flexion and rotational maneuvers; this is compared to a normal hip exam on the right with no pain and full range of motion.  Neurovascularly, she is intact in bilateral lower extremities and leg lengths are equal while standing.  IMPRESSION  1. Osteoarthritis of left hip.  2. Fibromyalgia.  3. Osteoarthritis.  4. Anxiety and depression.  5. Recent upper respiratory infection, being treated on Tequin.  PLAN:  The patient will be admitted for left total hip arthroplasty on October 29, 2000.  We will make sure, of course, that her upper respiratory infection has resolved by this time.  She will call the office if she is having any worsening symptoms. DD:  10/24/00 TD:  10/24/00 Job: [id number]

## 2011-03-03 NOTE — H&P (Signed)
[facility]  Patient:    Aimee Jones, Aimee Jones                     MRN: [id number] Adm. Date:  10/21/00 Attending:  Titus Dubin. Alwyn Ren, M.D. [facility] CC:         [name], M.D.  Stacie Glaze, M.D. [facility]   History and Physical  FAST TRACK  HISTORY:  The patient is seen for sore throat present for three days, progressive over the past 48 hours, associated with cough and congestion, both progressive over a 4-5 period.  She has a history of asthma, for which she apparently takes Advair, one inhalation q.12h., and recurrent bronchitis.  She maintains that Dr. Lovell Sheehan will simply call her in Zithromax whenever she has these episodes.  She called the [facility] office and was offered an appointment on October 20, 2000 by Dr. Lutricia Horsfall, but the patient declined this.  She called today requesting antibiotics, and arrangements were made for her to be seen in the fast track, as she is scheduled for total knee replacement on October 29, 2000 by Dr. Lequita Halt.  She has never smoked.  MEDICATIONS: 1. Imipramine. 2. Xanax for panic disorder. 3. Premarin. 4. Lipitor. 5. She was on Vioxx and ibuprofen for the hip pain, but these were stopped on    October 17, 2000 in preparation for surgery. 6. Vicodin q.4-6h. p.r.n.  REVIEW OF SYSTEMS:  Positive for green sputum.  She denies any sinusitis type symptoms.  She also has had no significant chest pain or shortness of breath.  PHYSICAL EXAMINATION:  VITAL SIGNS:  Blood pressure 144/98, pulse 103 at rest, respiratory rate 20, temperature 97.8.  HEENT:  She is slightly hoarse.  There is increased cerumen of the canals.  LYMPH:  No lymphadenopathy about the head, neck or axilla.  NECK:  There is slight fullness of the right neck, but the thyroid is normal to palpation.  HEART:  S4 gallop is noted with a grade 1-1/2 to 1 systolic murmur.  LUNGS:  Breath sounds clear without wheezing.  LABORATORY DATA:  White  count 11,800.  Beta Strep is pending.  IMPRESSION:  She has bronchitis with a history of asthma, but no active bronchospasm.  She also has a history of PENICILLIN allergy.  She is now scheduled for left ______ replacement on January 14.  PLAN:  She will be placed on Tequin 400 mg q.d. for seven days and given Duratuss G 1200 mg, one q.12h. with 8 oz of water.  She has been advised to avoid excess dairy products because of the thick secretions she is producing. At this time, there is no definite contraindication to the surgery.  She has asked about SVE prophylaxis postoperatively as mentioned by Dr. Lequita Halt.  Dr. Lovell Sheehan can provide with an SVE prophylaxis card to be carried. The protocol will be for the PENICILLIN allergic individual.  Unless there is progression of symptoms or failure to respond to the Tequin, it is anticipated that she could complete the total ______ surgery as scheduled. DD:  10/21/00 TD:  10/21/00 Job: [id number]

## 2011-03-08 ENCOUNTER — Telehealth (INDEPENDENT_AMBULATORY_CARE_PROVIDER_SITE_OTHER): Payer: Medicare Other | Admitting: Internal Medicine

## 2011-03-08 ENCOUNTER — Other Ambulatory Visit: Payer: Self-pay | Admitting: *Deleted

## 2011-03-08 DIAGNOSIS — F329 Major depressive disorder, single episode, unspecified: Secondary | ICD-10-CM

## 2011-03-08 MED ORDER — HYDROCODONE-IBUPROFEN 7.5-200 MG PO TABS: 1.0000 | ORAL_TABLET | Freq: Two times a day (BID) | ORAL | Status: DC

## 2011-03-08 NOTE — Telephone Encounter (Signed)
Please advise 

## 2011-03-08 NOTE — Telephone Encounter (Signed)
Pt is wondering if Dr Lovell Sheehan would consider prescribing Abilify to pt, to take along with pts other meds to help with depression. [facility] [address] [phone]

## 2011-03-09 MED ORDER — ARIPIPRAZOLE 2 MG PO TABS: 2.0000 mg | ORAL_TABLET | Freq: Every day | ORAL | Status: DC

## 2011-03-09 NOTE — Telephone Encounter (Signed)
Notified pt. 

## 2011-03-09 NOTE — Telephone Encounter (Signed)
Per dr Lovell Sheehan- may have abilify 2 mg daily

## 2011-03-09 NOTE — Telephone Encounter (Signed)
Pt called back again to see status of getting Abilify. Pls call.

## 2011-03-15 NOTE — Progress Notes (Signed)
Subjective:    Patient ID: Aimee Jones, female    DOB: [birth year], 65 y.o.   MRN: [id number]  HPI Patient presents with her husband to discuss multiple problems including her history of alcohol abuse hypertension history of depression with panic attacks and a history of COPD.  She states that she's been breathing well has no current shortness of breath and is using her inhalers appropriately.  Her depression appears to be moderately well controlled but she realizes that she is still using alcohol and is resuming attendance at AA we discussed with her husband and her the necessity of no alcohol in her life she is unable to control this issue.  We also discussed pain control did not want to substitute 1 addiction for another.   Review of Systems  Constitutional: Negative for activity change, appetite change and fatigue.  HENT: Negative for ear pain, congestion, neck pain, postnasal drip and sinus pressure.   Eyes: Negative for redness and visual disturbance.  Respiratory: Negative for cough, shortness of breath and wheezing.   Gastrointestinal: Negative for abdominal pain and abdominal distention.  Genitourinary: Negative for dysuria, frequency and menstrual problem.  Musculoskeletal: Negative for myalgias, joint swelling and arthralgias.  Skin: Negative for rash and wound.  Neurological: Negative for dizziness, weakness and headaches.  Hematological: Negative for adenopathy. Does not bruise/bleed easily.  Psychiatric/Behavioral: Negative for sleep disturbance and decreased concentration.   Past Medical History  Diagnosis Date  . Hyperlipidemia   . ETOH abuse   . Allergy   . Diverticulosis   . Hypertension   . Depression   . Arthritis   . Asthma   . GERD (gastroesophageal reflux disease)   . Normal 24 hour ambulatory pH monitoring study     good acid suppression   Past Surgical History  Procedure Date  . Dobutamine stress echo 07/09/2001    normal  .  Esophagogastroduodenoscopy 06/11/2006    normal  . Rotator cuff repair   . Abdominal hysterectomy   . Total hip arthroplasty     left 1991/right 2008  . Nasal sinus surgery 1990    reports that she has never smoked. She does not have any smokeless tobacco history on file. She reports that she drinks alcohol. She reports that she does not use illicit drugs. family history is not on file. Allergies  Allergen Reactions  . Latex   . Penicillins      Objective:   Physical Exam  Constitutional: She is oriented to person, place, and time. She appears well-developed and well-nourished. No distress.  HENT:  Head: Normocephalic and atraumatic.  Right Ear: External ear normal.  Left Ear: External ear normal.  Nose: Nose normal.  Mouth/Throat: Oropharynx is clear and moist.  Eyes: Conjunctivae and EOM are normal. Pupils are equal, round, and reactive to light.  Neck: Normal range of motion. Neck supple. No JVD present. No tracheal deviation present. No thyromegaly present.  Cardiovascular: Normal rate, regular rhythm, normal heart sounds and intact distal pulses.   No murmur heard. Pulmonary/Chest: Effort normal and breath sounds normal. She has no wheezes. She exhibits no tenderness.  Abdominal: Soft. Bowel sounds are normal.  Musculoskeletal: Normal range of motion. She exhibits no edema and no tenderness.  Lymphadenopathy:    She has no cervical adenopathy.  Neurological: She is alert and oriented to person, place, and time. She has normal reflexes. No cranial nerve deficit.  Skin: Skin is warm and dry. She is not diaphoretic.  Psychiatric: She has a  normal mood and affect. Her behavior is normal.          Assessment & Plan:  We spent over 30 minutes face-to-face counseling the patient about alcohol use.  Her fibromyalgia pain is stable and we reviewed not increasing her pain medications at this time but monitoring it carefully to prevent addiction.  Her blood pressure is  well-controlled with panic attacks appear to be controlled Her COPD is controlled under control medication without use of rescue

## 2011-03-24 ENCOUNTER — Other Ambulatory Visit: Payer: Self-pay | Admitting: *Deleted

## 2011-03-24 MED ORDER — ALPRAZOLAM 0.5 MG PO TABS: 0.5000 mg | ORAL_TABLET | Freq: Three times a day (TID) | ORAL | Status: DC | PRN

## 2011-04-11 ENCOUNTER — Other Ambulatory Visit: Payer: Self-pay | Admitting: *Deleted

## 2011-04-14 ENCOUNTER — Other Ambulatory Visit: Payer: Self-pay | Admitting: *Deleted

## 2011-04-17 ENCOUNTER — Telehealth: Payer: Self-pay | Admitting: *Deleted

## 2011-04-17 ENCOUNTER — Other Ambulatory Visit: Payer: Self-pay | Admitting: *Deleted

## 2011-04-17 MED ORDER — HYDROCODONE-ACETAMINOPHEN 7.5-325 MG PO TABS
1.0000 | ORAL_TABLET | Freq: Three times a day (TID) | ORAL | Status: DC | PRN
Start: 2011-04-17 — End: 2011-05-12

## 2011-04-17 NOTE — Telephone Encounter (Signed)
Date: 04/16/2011 12:00:00 AM Time of Call: [phone] Faxed To: [facility] CallerDedra Skeens Fax Number: [phone] Facility: home Patient: Aimee Jones, Aimee Jones DOB: [birth year] Phone: [phone] Provider: Darryll Capers Message: Need refill for vicoprefon and xanex. Instructed to call back in AM. Regarding Appointment: Appt Date: Appt Time: Unknown Provider: Reason: Details: Outcome: Message Taken by: Corinna Lines, CSR [facility]  [address] [place], [place] [address]  P: [phone]  F: [phone]

## 2011-04-20 ENCOUNTER — Other Ambulatory Visit: Payer: Self-pay | Admitting: *Deleted

## 2011-04-20 NOTE — Telephone Encounter (Signed)
This was done.

## 2011-04-24 ENCOUNTER — Telehealth: Payer: Self-pay | Admitting: Internal Medicine

## 2011-04-24 NOTE — Telephone Encounter (Signed)
I called [facility] they state that hydrocodone #30 was shipped on 7/5 -4 days ago- Left message on machine for pt

## 2011-04-24 NOTE — Telephone Encounter (Signed)
Refill Vicoprofen to [facility]. Patient stated that they never received rx from 2 weeks ago when requested.

## 2011-04-28 ENCOUNTER — Other Ambulatory Visit: Payer: Self-pay | Admitting: Internal Medicine

## 2011-04-28 MED ORDER — ARIPIPRAZOLE 2 MG PO TABS: 2.0000 mg | ORAL_TABLET | Freq: Every day | ORAL | Status: DC

## 2011-05-12 ENCOUNTER — Telehealth: Payer: Self-pay | Admitting: Internal Medicine

## 2011-05-12 MED ORDER — HYDROCODONE-ACETAMINOPHEN 7.5-325 MG PO TABS: 1.0000 | ORAL_TABLET | Freq: Three times a day (TID) | ORAL | Status: DC | PRN

## 2011-05-15 ENCOUNTER — Other Ambulatory Visit: Payer: Self-pay | Admitting: Internal Medicine

## 2011-05-22 ENCOUNTER — Other Ambulatory Visit: Payer: Self-pay | Admitting: *Deleted

## 2011-05-22 MED ORDER — DOXEPIN HCL 10 MG/ML PO CONC: 10.0000 mg | Freq: Every day | ORAL | Status: DC

## 2011-05-24 ENCOUNTER — Ambulatory Visit: Payer: Medicare Other | Admitting: Internal Medicine

## 2011-06-05 ENCOUNTER — Telehealth: Payer: Self-pay | Admitting: *Deleted

## 2011-06-05 NOTE — Telephone Encounter (Signed)
Pt. Calls in stating she is leaving for [place] at lunch time, and needs RX ASAP.  She thinks she has oral thrush from a new puppy that licks her in the face constantly.  She CANNOT see anyone before she leaves...does not have time?

## 2011-06-05 NOTE — Telephone Encounter (Signed)
The story does not make sense. One does not get thrush from puppies.  It is very unlikely that she has thrush unless she is immunosuppressed or recent ABX (i don't see any on list).   She may consider [facility] while in [place]

## 2011-06-05 NOTE — Telephone Encounter (Signed)
I asked about antibiotics and she said "no".  Also, I told her puppies normally don't carry thrush, so I assume she has left now for [place].

## 2011-06-06 NOTE — Telephone Encounter (Signed)
Refill done.  

## 2011-06-14 ENCOUNTER — Telehealth: Payer: Self-pay | Admitting: Internal Medicine

## 2011-06-14 MED ORDER — HYDROCODONE-ACETAMINOPHEN 7.5-325 MG PO TABS: 1.0000 | ORAL_TABLET | Freq: Three times a day (TID) | ORAL | Status: DC | PRN

## 2011-06-14 NOTE — Telephone Encounter (Signed)
Husband calling for wife, Dedra Skeens. They are traveling in [place] and she is about out of her hydrocodone. They would like you to call som in to a [facility] up there. The [facility] ph # is [phone]. I gave you his cell where you can reach him if need be.

## 2011-07-05 ENCOUNTER — Telehealth: Payer: Self-pay | Admitting: *Deleted

## 2011-07-05 NOTE — Telephone Encounter (Signed)
Pt is calling stating the thrush is gone, and she now has an extremely sore throat.  Looks like strept when she looks in Ship [facility]. Temp 99.6.  [facility] [phone].  Cannot take Penicillin but wants an antibiotic. Pt number to be reached [phone]

## 2011-07-05 NOTE — Telephone Encounter (Signed)
Talked with pt and sounds very croupy- since she is not here .she needs to go to urgent care. Pt was very thick tongued and slurred speech.

## 2011-07-13 ENCOUNTER — Telehealth: Payer: Self-pay | Admitting: *Deleted

## 2011-07-13 NOTE — Telephone Encounter (Signed)
Pt only wants to talk to [facility] about seeing Dr. Lovell Sheehan this week or next re:  SOB, and coughing.  She was told at an Urgent Care 4 weeks ago, she had croup.

## 2011-07-13 NOTE — Telephone Encounter (Signed)
Pt already h as appointment next week and I made her aware- she is concerned about cough she has-- pt was told she can go and have cxr if she would like ,but she declined

## 2011-07-17 ENCOUNTER — Other Ambulatory Visit: Payer: Self-pay | Admitting: Internal Medicine

## 2011-07-18 ENCOUNTER — Telehealth: Payer: Self-pay | Admitting: *Deleted

## 2011-07-18 NOTE — Telephone Encounter (Signed)
It has been34 days so #60 was called in

## 2011-07-18 NOTE — Telephone Encounter (Signed)
Pt is totally out of Hydrocodone 7.5mg /3.25 mg. And needs it refilled TODAY.

## 2011-07-19 ENCOUNTER — Ambulatory Visit (INDEPENDENT_AMBULATORY_CARE_PROVIDER_SITE_OTHER): Payer: Medicare Other | Admitting: Internal Medicine

## 2011-07-19 ENCOUNTER — Encounter: Payer: Self-pay | Admitting: Internal Medicine

## 2011-07-19 VITALS — BP 130/80 | HR 76 | Temp 98.2°F | Resp 16 | Ht 67.0 in | Wt 196.0 lb

## 2011-07-19 DIAGNOSIS — J45909 Unspecified asthma, uncomplicated: Secondary | ICD-10-CM

## 2011-07-19 DIAGNOSIS — IMO0001 Reserved for inherently not codable concepts without codable children: Secondary | ICD-10-CM

## 2011-07-19 DIAGNOSIS — G219 Secondary parkinsonism, unspecified: Secondary | ICD-10-CM

## 2011-07-19 DIAGNOSIS — F1010 Alcohol abuse, uncomplicated: Secondary | ICD-10-CM

## 2011-07-19 DIAGNOSIS — I10 Essential (primary) hypertension: Secondary | ICD-10-CM

## 2011-07-19 MED ORDER — BUDESONIDE-FORMOTEROL FUMARATE 160-4.5 MCG/ACT IN AERO: 2.0000 | INHALATION_SPRAY | Freq: Two times a day (BID) | RESPIRATORY_TRACT | Status: DC

## 2011-07-26 LAB — COMPREHENSIVE METABOLIC PANEL
Alkaline Phosphatase: 80
BUN: 9
Chloride: 100
Glucose, Bld: 90
Potassium: 4.3
Total Bilirubin: 0.5
Total Protein: 5.7 — ABNORMAL LOW

## 2011-07-26 LAB — CBC
HCT: 29 — ABNORMAL LOW
HCT: 30.5 — ABNORMAL LOW
HCT: 35 — ABNORMAL LOW
HCT: 37.9
Hemoglobin: 10.2 — ABNORMAL LOW
Hemoglobin: 12.8
MCHC: 33.1
MCHC: 33.5
MCV: 80.9
MCV: 80.9
Platelets: 375
Platelets: 425 — ABNORMAL HIGH
Platelets: 464 — ABNORMAL HIGH
RDW: 15.4 — ABNORMAL HIGH
RDW: 16 — ABNORMAL HIGH
RDW: 16.2 — ABNORMAL HIGH
WBC: 9.9

## 2011-07-26 LAB — URINALYSIS, ROUTINE W REFLEX MICROSCOPIC
Glucose, UA: NEGATIVE
Hgb urine dipstick: NEGATIVE
Ketones, ur: NEGATIVE
Protein, ur: NEGATIVE

## 2011-07-26 LAB — BASIC METABOLIC PANEL
BUN: 3 — ABNORMAL LOW
BUN: 5 — ABNORMAL LOW
CO2: 24
CO2: 30
Calcium: 8.6
Chloride: 103
Creatinine, Ser: 0.69
Glucose, Bld: 138 — ABNORMAL HIGH
Potassium: 3.9
Potassium: 3.9
Sodium: 138

## 2011-07-26 LAB — URINE MICROSCOPIC-ADD ON

## 2011-07-26 LAB — PROTIME-INR
INR: 0.9
Prothrombin Time: 12.5

## 2011-07-26 LAB — TYPE AND SCREEN

## 2011-07-31 ENCOUNTER — Other Ambulatory Visit: Payer: Self-pay | Admitting: Internal Medicine

## 2011-08-03 ENCOUNTER — Other Ambulatory Visit: Payer: Self-pay | Admitting: Internal Medicine

## 2011-08-15 ENCOUNTER — Telehealth: Payer: Self-pay | Admitting: *Deleted

## 2011-08-15 ENCOUNTER — Other Ambulatory Visit: Payer: Self-pay | Admitting: *Deleted

## 2011-08-15 MED ORDER — ALPRAZOLAM 0.5 MG PO TABS: 0.5000 mg | ORAL_TABLET | Freq: Three times a day (TID) | ORAL | Status: DC | PRN

## 2011-08-15 NOTE — Telephone Encounter (Signed)
Done

## 2011-08-18 ENCOUNTER — Other Ambulatory Visit: Payer: Self-pay | Admitting: Internal Medicine

## 2011-08-18 MED ORDER — HYDROCODONE-ACETAMINOPHEN 7.5-325 MG PO TABS: 1.0000 | ORAL_TABLET | Freq: Three times a day (TID) | ORAL | Status: DC | PRN

## 2011-08-18 NOTE — Telephone Encounter (Signed)
REFILLED 1 TIME

## 2011-08-18 NOTE — Telephone Encounter (Signed)
[facility] Thanks.

## 2011-08-21 ENCOUNTER — Other Ambulatory Visit: Payer: Self-pay | Admitting: Internal Medicine

## 2011-08-23 ENCOUNTER — Telehealth: Payer: Self-pay | Admitting: Internal Medicine

## 2011-08-23 NOTE — Telephone Encounter (Signed)
Received #90 on 10-30--pt informed too early

## 2011-08-23 NOTE — Telephone Encounter (Signed)
Refill Alprazolam .5mg  to [facility]. Thanks.

## 2011-08-24 ENCOUNTER — Other Ambulatory Visit: Payer: Self-pay | Admitting: Internal Medicine

## 2011-08-25 ENCOUNTER — Other Ambulatory Visit: Payer: Self-pay | Admitting: Internal Medicine

## 2011-08-28 ENCOUNTER — Other Ambulatory Visit: Payer: Self-pay | Admitting: Internal Medicine

## 2011-09-12 ENCOUNTER — Other Ambulatory Visit: Payer: Self-pay | Admitting: *Deleted

## 2011-09-12 MED ORDER — HYDROCODONE-ACETAMINOPHEN 7.5-325 MG PO TABS: 1.0000 | ORAL_TABLET | Freq: Three times a day (TID) | ORAL | Status: DC | PRN

## 2011-09-13 ENCOUNTER — Telehealth: Payer: Self-pay | Admitting: Internal Medicine

## 2011-09-13 NOTE — Telephone Encounter (Signed)
Pt is vacationing in [place] and is needing 2 of her scripts called in to [facility] in [place]. Pls call in pts HYDROcodone-acetaminophen (NORCO) 7.5-325 MG per tablet and doxepin (SINEQUAN) 25 MG capsule. Pls call in today. Pt will be in [place] through Christmas.

## 2011-09-13 NOTE — Telephone Encounter (Signed)
Cant have refilled until dec 1- [facility] and they will contact [facility] [place] [facility] and refill as appropriate

## 2011-10-02 ENCOUNTER — Telehealth: Payer: Self-pay

## 2011-10-02 DIAGNOSIS — G219 Secondary parkinsonism, unspecified: Secondary | ICD-10-CM

## 2011-10-02 MED ORDER — ALPRAZOLAM 0.5 MG PO TABS: 0.5000 mg | ORAL_TABLET | Freq: Three times a day (TID) | ORAL | Status: DC | PRN

## 2011-10-02 MED ORDER — DOXEPIN HCL 25 MG PO CAPS: 25.0000 mg | ORAL_CAPSULE | Freq: Every day | ORAL | Status: DC

## 2011-10-02 MED ORDER — HYDROCODONE-ACETAMINOPHEN 7.5-325 MG PO TABS: 1.0000 | ORAL_TABLET | Freq: Three times a day (TID) | ORAL | Status: DC | PRN

## 2011-10-02 NOTE — Telephone Encounter (Signed)
30 day supply with 0 refilles called in

## 2011-10-02 NOTE — Telephone Encounter (Signed)
Pt is going on vacation and needs to make sure she has enough alprazolam, hydrocodone, and doxeipin. [facility] [address]

## 2011-10-09 ENCOUNTER — Telehealth: Payer: Self-pay | Admitting: Internal Medicine

## 2011-10-09 NOTE — Telephone Encounter (Signed)
Pt is having a lot of pain and is requesting a refill on HYDROcodone-acetaminophen (NORCO) 7.5-325 MG per tablet   [facility]  [address] [phone]

## 2011-10-12 ENCOUNTER — Other Ambulatory Visit: Payer: Self-pay | Admitting: *Deleted

## 2011-10-12 NOTE — Telephone Encounter (Signed)
Pt would like norco [facility] [address] [phone] when its due.

## 2011-10-12 NOTE — Telephone Encounter (Signed)
Per dr Lovell Sheehan- too early-pt informed

## 2011-10-16 ENCOUNTER — Telehealth: Payer: Self-pay

## 2011-10-16 NOTE — Patient Instructions (Signed)
The patient is instructed to continue all medications as prescribed. Schedule followup with check out clerk upon leaving the clinic  

## 2011-10-16 NOTE — Progress Notes (Signed)
Subjective:    Patient ID: Aimee Jones, female    DOB: 1945/12/15, 65 y.o.   MRN: [id number]  HPI Elpidio Galea is a 65 year old white female for hyperlipidemia hypertension in the setting of coronary artery disease history.  She also has a history of alcohol and substance abuse and has experienced secondary Parkinson symptoms secondary to medications in the past.  She is followed for her hypertension, and depressive disorder today.  She has a history of chronic asthmatic bronchitis. Which is stable She has a history of fibromyalgia for which she has chronic pain and is allowed a limited number of pain medications based upon a contract signed with the patient given her history of alcohol and substance abuse   Review of Systems  Constitutional: Negative for activity change, appetite change and fatigue.  HENT: Negative for ear pain, congestion, neck pain, postnasal drip and sinus pressure.   Eyes: Negative for redness and visual disturbance.  Respiratory: Negative for cough, shortness of breath and wheezing.   Gastrointestinal: Negative for abdominal pain and abdominal distention.  Genitourinary: Negative for dysuria, frequency and menstrual problem.  Musculoskeletal: Negative for myalgias, joint swelling and arthralgias.  Skin: Negative for rash and wound.  Neurological: Negative for dizziness, weakness and headaches.  Hematological: Negative for adenopathy. Does not bruise/bleed easily.  Psychiatric/Behavioral: Negative for sleep disturbance and decreased concentration.   Past Medical History  Diagnosis Date  . Hyperlipidemia   . ETOH abuse   . Allergy   . Diverticulosis   . Hypertension   . Depression   . Arthritis   . Asthma   . GERD (gastroesophageal reflux disease)   . Normal 24 hour ambulatory pH monitoring study     good acid suppression    History   Social History  . Marital Status: Married    Spouse Name: N/A    Number of Children: N/A  . Years of Education:  N/A   Occupational History  . Not on file.   Social History Main Topics  . Smoking status: Never Smoker   . Smokeless tobacco: Not on file  . Alcohol Use: Yes  . Drug Use: No  . Sexually Active: Yes   Other Topics Concern  . Not on file   Social History Narrative  . No narrative on file    Past Surgical History  Procedure Date  . Dobutamine stress echo 07/09/2001    normal  . Esophagogastroduodenoscopy 06/11/2006    normal  . Rotator cuff repair   . Abdominal hysterectomy   . Total hip arthroplasty     left 1991/right 2008  . Nasal sinus surgery 1990    No family history on file.  Allergies  Allergen Reactions  . Latex   . Penicillins     Current Outpatient Prescriptions on File Prior to Visit  Medication Sig Dispense Refill  . ascorbic acid (VITAMIN C) 500 MG tablet Take 500 mg by mouth daily.        . B Complex-C (SUPER B COMPLEX PO) Take by mouth daily.        . CELEBREX 200 MG capsule ONE BY MOUTH DAILY  90 capsule  1  . citalopram (CELEXA) 20 MG tablet TAKE 1 TABLET BY MOUTH EVERY DAY  30 tablet  6  . estrogens, conjugated, (PREMARIN) 0.625 MG tablet Take 0.625 mg by mouth daily. Take daily for 21 days then do not take for 7 days.       . fish oil-omega-3 fatty acids 1000 MG capsule  Take 2 g by mouth daily.        Marland Kitchen olmesartan-hydrochlorothiazide (BENICAR HCT) 40-12.5 MG per tablet Take 1 tablet by mouth daily.        . pantoprazole (PROTONIX) 40 MG tablet Take 1 tablet (40 mg total) by mouth daily.  90 tablet  3  . promethazine (PHENERGAN) 25 MG tablet TAKE 1 TABLET EVERY 6 HOURS AS NEEDED NAUSEA  20 tablet  0    BP 130/80  Pulse 76  Temp 98.2 F (36.8 C)  Resp 16  Ht 5\' 7"  (1.702 m)  Wt 196 lb (88.905 kg)  BMI 30.70 kg/m2       Objective:   Physical Exam  Constitutional: She is oriented to person, place, and time. She appears well-developed and well-nourished. No distress.  HENT:  Head: Normocephalic and atraumatic.  Right Ear: External ear  normal.  Left Ear: External ear normal.  Nose: Nose normal.  Mouth/Throat: Oropharynx is clear and moist.  Eyes: Conjunctivae and EOM are normal. Pupils are equal, round, and reactive to light.  Neck: Normal range of motion. Neck supple. No JVD present. No tracheal deviation present. No thyromegaly present.  Cardiovascular: Normal rate, regular rhythm, normal heart sounds and intact distal pulses.   No murmur heard. Pulmonary/Chest: Effort normal and breath sounds normal. She has no wheezes. She exhibits no tenderness.  Abdominal: Soft. Bowel sounds are normal.  Musculoskeletal: Normal range of motion. She exhibits no edema and no tenderness.  Lymphadenopathy:    She has no cervical adenopathy.  Neurological: She is alert and oriented to person, place, and time. She has normal reflexes. No cranial nerve deficit.  Skin: Skin is warm and dry. She is not diaphoretic.  Psychiatric: She has a normal mood and affect. Her behavior is normal.          Assessment & Plan:  And her pain medication use and again restated our position on a limited number of medications about a week ago no early refills allowed.  She does carry the diagnosis of fibromyalgia and does have apparent musculoskeletal pain but she is and both the patient and her husband understood this. There blood pressure was well controlled on her current medications  She continues to use alcohol limited fashion but  discussed the need to attend AA and that any use of alcohol has a high risk for developing dependency

## 2011-10-16 NOTE — Telephone Encounter (Signed)
Pt questions if appointment is 1/4 or 2/4.  Pt notified that appt is November 19, 2010 at 10:30am

## 2011-10-26 ENCOUNTER — Telehealth: Payer: Self-pay | Admitting: *Deleted

## 2011-10-26 MED ORDER — CELECOXIB 200 MG PO CAPS: 200.0000 mg | ORAL_CAPSULE | Freq: Every day | ORAL | Status: DC

## 2011-10-26 NOTE — Telephone Encounter (Signed)
Pt states she lost her pocketbook, and her Celebrex was in it.  As[facility]

## 2011-10-26 NOTE — Telephone Encounter (Signed)
Sent in

## 2011-11-06 ENCOUNTER — Other Ambulatory Visit: Payer: Self-pay | Admitting: Internal Medicine

## 2011-11-07 ENCOUNTER — Other Ambulatory Visit: Payer: Self-pay | Admitting: *Deleted

## 2011-11-07 DIAGNOSIS — H1045 Other chronic allergic conjunctivitis: Secondary | ICD-10-CM | POA: Diagnosis not present

## 2011-11-07 DIAGNOSIS — J3089 Other allergic rhinitis: Secondary | ICD-10-CM | POA: Diagnosis not present

## 2011-11-07 DIAGNOSIS — J45909 Unspecified asthma, uncomplicated: Secondary | ICD-10-CM | POA: Diagnosis not present

## 2011-11-07 MED ORDER — HYDROCODONE-ACETAMINOPHEN 7.5-325 MG PO TABS: 1.0000 | ORAL_TABLET | Freq: Three times a day (TID) | ORAL | Status: DC | PRN

## 2011-11-17 ENCOUNTER — Other Ambulatory Visit: Payer: Self-pay | Admitting: Internal Medicine

## 2011-11-20 ENCOUNTER — Encounter: Payer: Self-pay | Admitting: Internal Medicine

## 2011-11-20 ENCOUNTER — Telehealth: Payer: Self-pay | Admitting: Internal Medicine

## 2011-11-20 ENCOUNTER — Ambulatory Visit (INDEPENDENT_AMBULATORY_CARE_PROVIDER_SITE_OTHER): Payer: Medicare Other | Admitting: Internal Medicine

## 2011-11-20 VITALS — BP 140/80 | HR 76 | Temp 98.2°F | Resp 16 | Ht 67.0 in | Wt 208.0 lb

## 2011-11-20 DIAGNOSIS — E66811 Obesity, class 1: Secondary | ICD-10-CM

## 2011-11-20 DIAGNOSIS — F1011 Alcohol abuse, in remission: Secondary | ICD-10-CM

## 2011-11-20 DIAGNOSIS — E669 Obesity, unspecified: Secondary | ICD-10-CM

## 2011-11-20 DIAGNOSIS — I10 Essential (primary) hypertension: Secondary | ICD-10-CM

## 2011-11-20 DIAGNOSIS — T887XXA Unspecified adverse effect of drug or medicament, initial encounter: Secondary | ICD-10-CM

## 2011-11-20 MED ORDER — PHENTERMINE HCL 37.5 MG PO CAPS: 37.5000 mg | ORAL_CAPSULE | ORAL | Status: DC

## 2011-11-20 MED ORDER — ARIPIPRAZOLE 2 MG PO TABS: 2.0000 mg | ORAL_TABLET | Freq: Every day | ORAL | Status: DC

## 2011-11-20 NOTE — Telephone Encounter (Signed)
Pharmacist can not send escript to pcp because pcp has not been updated to rcv escripts from pharmacy through [facility]. Pls fax refill 90 day supply ARIPiprazole (ABILIFY) 2 MG tablet

## 2011-11-20 NOTE — Telephone Encounter (Signed)
Script given to pt while here today

## 2011-11-20 NOTE — Patient Instructions (Signed)
The patient is instructed to continue all medications as prescribed. Schedule followup with check out clerk upon leaving the clinic  

## 2011-11-21 ENCOUNTER — Other Ambulatory Visit: Payer: Self-pay | Admitting: *Deleted

## 2011-11-21 MED ORDER — ALPRAZOLAM 0.5 MG PO TABS: 0.5000 mg | ORAL_TABLET | Freq: Three times a day (TID) | ORAL | Status: DC | PRN

## 2011-11-22 ENCOUNTER — Other Ambulatory Visit: Payer: Self-pay | Admitting: *Deleted

## 2011-11-22 NOTE — Progress Notes (Signed)
Subjective:    Patient ID: Aimee Jones, female    DOB: 03-28-1946, 66 y.o.   MRN: [id number]  HPI  Patient is a 66 year old female who is followed for hypertension gastroesophageal reflux and depression.  She also has a history of chronic alcohol use and is a frequent request for pain medications.  Her blood pressure today is stable she denies chest pain shortness of breath PND orthopnea she has noticed weight gain and she is very concerned that this will affect her breathing and she is requesting an appetite suppressant.  We discussed the use of an appetite suppressant in light of known anxiety panic attacks  Review of Systems  Constitutional: Negative for activity change, appetite change and fatigue.  HENT: Negative for ear pain, congestion, neck pain, postnasal drip and sinus pressure.   Eyes: Negative for redness and visual disturbance.  Respiratory: Negative for cough, shortness of breath and wheezing.   Gastrointestinal: Negative for abdominal pain and abdominal distention.  Genitourinary: Negative for dysuria, frequency and menstrual problem.  Musculoskeletal: Negative for myalgias, joint swelling and arthralgias.  Skin: Negative for rash and wound.  Neurological: Negative for dizziness, weakness and headaches.  Hematological: Negative for adenopathy. Does not bruise/bleed easily.  Psychiatric/Behavioral: Negative for sleep disturbance and decreased concentration.   Past Medical History  Diagnosis Date  . Hyperlipidemia   . ETOH abuse   . Allergy   . Diverticulosis   . Hypertension   . Depression   . Arthritis   . Asthma   . GERD (gastroesophageal reflux disease)   . Normal 24 hour ambulatory pH monitoring study     good acid suppression    History   Social History  . Marital Status: Married    Spouse Name: N/A    Number of Children: N/A  . Years of Education: N/A   Occupational History  . Not on file.   Social History Main Topics  . Smoking status:  Never Smoker   . Smokeless tobacco: Not on file  . Alcohol Use: Yes  . Drug Use: No  . Sexually Active: Yes   Other Topics Concern  . Not on file   Social History Narrative  . No narrative on file    Past Surgical History  Procedure Date  . Dobutamine stress echo 07/09/2001    normal  . Esophagogastroduodenoscopy 06/11/2006    normal  . Rotator cuff repair   . Abdominal hysterectomy   . Total hip arthroplasty     left 1991/right 2008  . Nasal sinus surgery 1990    No family history on file.  Allergies  Allergen Reactions  . Latex   . Penicillins     Current Outpatient Prescriptions on File Prior to Visit  Medication Sig Dispense Refill  . ALPRAZolam (XANAX) 0.5 MG tablet Take 1 tablet (0.5 mg total) by mouth 3 (three) times daily as needed for sleep or anxiety.  90 tablet  1  . ascorbic acid (VITAMIN C) 500 MG tablet Take 500 mg by mouth daily.        Marland Kitchen atorvastatin (LIPITOR) 10 MG tablet TAKE 1 TABLET EVERY DAY  90 tablet  3  . B Complex-C (SUPER B COMPLEX PO) Take by mouth daily.        . budesonide-formoterol (SYMBICORT) 160-4.5 MCG/ACT inhaler Inhale 2 puffs into the lungs 2 (two) times daily.  1 Inhaler  12  . celecoxib (CELEBREX) 200 MG capsule Take 1 capsule (200 mg total) by mouth daily.  90  capsule  1  . citalopram (CELEXA) 20 MG tablet TAKE 1 TABLET BY MOUTH EVERY DAY  30 tablet  6  . doxepin (SINEQUAN) 25 MG capsule TAKE ONE CAPSULE AT BEDTIME AS NEEDED SLEEP  30 capsule  0  . estrogens, conjugated, (PREMARIN) 0.625 MG tablet Take 0.625 mg by mouth daily. Take daily for 21 days then do not take for 7 days.       . fish oil-omega-3 fatty acids 1000 MG capsule Take 2 g by mouth daily.        . fluticasone (FLONASE) 50 MCG/ACT nasal spray USE ONCE DAILY  48 g  11  . HYDROcodone-acetaminophen (NORCO) 7.5-325 MG per tablet Take 1 tablet by mouth every 8 (eight) hours as needed (must last 1 month).  60 tablet  0  . olmesartan-hydrochlorothiazide (BENICAR HCT)  40-12.5 MG per tablet Take 1 tablet by mouth daily.        . pantoprazole (PROTONIX) 40 MG tablet Take 1 tablet (40 mg total) by mouth daily.  90 tablet  3  . promethazine (PHENERGAN) 25 MG tablet TAKE 1 TABLET EVERY 6 HOURS AS NEEDED NAUSEA  20 tablet  0    BP 140/80  Pulse 76  Temp 98.2 F (36.8 C)  Resp 16  Ht 5\' 7"  (1.702 m)  Wt 208 lb (94.348 kg)  BMI 32.58 kg/m2       Objective:   Physical Exam  Nursing note and vitals reviewed. Constitutional: She is oriented to person, place, and time. She appears well-developed and well-nourished. No distress.  HENT:  Head: Normocephalic and atraumatic.  Right Ear: External ear normal.  Left Ear: External ear normal.  Nose: Nose normal.  Mouth/Throat: Oropharynx is clear and moist.  Eyes: Conjunctivae and EOM are normal. Pupils are equal, round, and reactive to light.  Neck: Normal range of motion. Neck supple. No JVD present. No tracheal deviation present. No thyromegaly present.  Cardiovascular: Normal rate, regular rhythm, normal heart sounds and intact distal pulses.   No murmur heard. Pulmonary/Chest: Effort normal and breath sounds normal. She has no wheezes. She exhibits no tenderness.  Abdominal: Soft. Bowel sounds are normal.  Musculoskeletal: Normal range of motion. She exhibits no edema and no tenderness.  Lymphadenopathy:    She has no cervical adenopathy.  Neurological: She is alert and oriented to person, place, and time. She has normal reflexes. No cranial nerve deficit.  Skin: Skin is warm and dry. She is not diaphoretic.  Psychiatric: She has a normal mood and affect. Her behavior is normal.          Assessment & Plan:  We will check an alcohol level today to make sure that she is no longer consuming alcohol.  Her blood pressure stable on her current antihypertensive medication we gave her samples of Benicar.  We discussed her anxiety and depression and the risk of using an appetite suppressant in that  setting she agrees that she will wishes to try the Strattera on even though this has some risk of causing panic because she feels that her weight gain could be more detrimental to her than this.  We will do a limited trial of Tenormin and monitor carefully her anxiety.  We also discussed her use of chronic pain medications and agreed that her prescription would be limited and refills will be carefully monitored by our staff per our contract

## 2011-11-22 NOTE — Telephone Encounter (Signed)
Pharmacy called and stated pt had refill on jan 21-will elt pt know too early

## 2011-11-22 NOTE — Telephone Encounter (Signed)
Pt need refill on doxepin 25 mg [facility] [address]. Pt is out.

## 2011-11-23 DIAGNOSIS — J309 Allergic rhinitis, unspecified: Secondary | ICD-10-CM | POA: Diagnosis not present

## 2011-11-28 DIAGNOSIS — J309 Allergic rhinitis, unspecified: Secondary | ICD-10-CM | POA: Diagnosis not present

## 2011-11-30 DIAGNOSIS — J309 Allergic rhinitis, unspecified: Secondary | ICD-10-CM | POA: Diagnosis not present

## 2011-12-06 ENCOUNTER — Telehealth: Payer: Self-pay | Admitting: *Deleted

## 2011-12-06 MED ORDER — HYDROCODONE-ACETAMINOPHEN 7.5-325 MG PO TABS: 1.0000 | ORAL_TABLET | Freq: Three times a day (TID) | ORAL | Status: DC | PRN

## 2011-12-06 NOTE — Telephone Encounter (Signed)
Called ina dn Left message on machine To call pt when ready

## 2011-12-06 NOTE — Telephone Encounter (Signed)
Needs Hydrocodone for a fall last night.  [facility] [address]

## 2011-12-11 ENCOUNTER — Other Ambulatory Visit: Payer: Self-pay | Admitting: Internal Medicine

## 2011-12-12 DIAGNOSIS — J309 Allergic rhinitis, unspecified: Secondary | ICD-10-CM | POA: Diagnosis not present

## 2011-12-14 ENCOUNTER — Telehealth: Payer: Self-pay | Admitting: *Deleted

## 2011-12-14 MED ORDER — PROMETHAZINE HCL 25 MG PO TABS: 25.0000 mg | ORAL_TABLET | Freq: Four times a day (QID) | ORAL | Status: DC | PRN

## 2011-12-14 NOTE — Telephone Encounter (Signed)
Per dr Jose Persia refill phenergan that she has ordered  And take advil for myalgia

## 2011-12-14 NOTE — Telephone Encounter (Signed)
Pt. Is complaining of nausea x 2 days, no nausea, no fever, no diarrhea.  Some body aches, but not bad.  Would like Dr. Lovell Sheehan to call in RX for nausea.  Has used Bonine, Pepto, and Tums and no relief with anything.

## 2011-12-14 NOTE — Telephone Encounter (Signed)
Notified pt. 

## 2011-12-18 ENCOUNTER — Telehealth: Payer: Self-pay

## 2011-12-18 NOTE — Telephone Encounter (Signed)
Ok per dr Lovell Sheehan- called in

## 2011-12-18 NOTE — Telephone Encounter (Signed)
Triage VM:  Pt states she went out of town this weekend and has misplaced her doxepin 25 mg.  Pt states she cannot find it anywhere.  Pt would like to know if Dr. Lovell Sheehan would make an exception and call in doxepin 25 mg to the pharmacy.  Pls advise.

## 2011-12-19 ENCOUNTER — Other Ambulatory Visit: Payer: Self-pay | Admitting: Internal Medicine

## 2011-12-19 DIAGNOSIS — J309 Allergic rhinitis, unspecified: Secondary | ICD-10-CM | POA: Diagnosis not present

## 2011-12-21 DIAGNOSIS — J309 Allergic rhinitis, unspecified: Secondary | ICD-10-CM | POA: Diagnosis not present

## 2011-12-25 ENCOUNTER — Telehealth: Payer: Self-pay | Admitting: Internal Medicine

## 2011-12-25 NOTE — Telephone Encounter (Signed)
Per dr Lovell Sheehan- according to controlled substance contract that pt signed,narcotics can not be refilled early- pt informed

## 2011-12-25 NOTE — Telephone Encounter (Signed)
Patient called stating that she has either lost or misplaced her bottle of hydocodone and she can not get a refill until 10 days. Patient requests that the MD give her enough to last until she can get a refill. Please advise.

## 2011-12-28 DIAGNOSIS — J309 Allergic rhinitis, unspecified: Secondary | ICD-10-CM | POA: Diagnosis not present

## 2012-01-01 ENCOUNTER — Telehealth: Payer: Self-pay | Admitting: *Deleted

## 2012-01-01 NOTE — Telephone Encounter (Signed)
Pt informed per dr Lovell Sheehan may refill on 3-21

## 2012-01-01 NOTE — Telephone Encounter (Signed)
Pt is asking [facility] just a few early [facility]fen [facility]

## 2012-01-02 DIAGNOSIS — J309 Allergic rhinitis, unspecified: Secondary | ICD-10-CM | POA: Diagnosis not present

## 2012-01-09 ENCOUNTER — Other Ambulatory Visit: Payer: Self-pay | Admitting: Internal Medicine

## 2012-01-09 DIAGNOSIS — J309 Allergic rhinitis, unspecified: Secondary | ICD-10-CM | POA: Diagnosis not present

## 2012-01-09 MED ORDER — ESTROGENS CONJUGATED 0.625 MG PO TABS: 0.6250 mg | ORAL_TABLET | Freq: Every day | ORAL | Status: DC

## 2012-01-09 NOTE — Telephone Encounter (Signed)
rx sent in electronically 

## 2012-01-09 NOTE — Telephone Encounter (Signed)
Pt needs refill on premarin 0.625mg  [facility] [address] [phone]

## 2012-01-11 DIAGNOSIS — J309 Allergic rhinitis, unspecified: Secondary | ICD-10-CM | POA: Diagnosis not present

## 2012-01-18 DIAGNOSIS — J309 Allergic rhinitis, unspecified: Secondary | ICD-10-CM | POA: Diagnosis not present

## 2012-01-31 ENCOUNTER — Telehealth: Payer: Self-pay | Admitting: Internal Medicine

## 2012-01-31 MED ORDER — ESTROGENS CONJUGATED 0.625 MG PO TABS: 0.6250 mg | ORAL_TABLET | Freq: Every day | ORAL | Status: DC

## 2012-01-31 NOTE — Telephone Encounter (Signed)
Patient's spouse called stating that his wife need a 90 day refill of premarin called [facility] [address]. Please assist. Patient also needs clarification on dosage. Please assist.

## 2012-01-31 NOTE — Telephone Encounter (Signed)
Sent to pharmacy and when pt was called she state s she had no questions

## 2012-02-01 DIAGNOSIS — J309 Allergic rhinitis, unspecified: Secondary | ICD-10-CM | POA: Diagnosis not present

## 2012-02-06 DIAGNOSIS — J309 Allergic rhinitis, unspecified: Secondary | ICD-10-CM | POA: Diagnosis not present

## 2012-02-08 ENCOUNTER — Telehealth: Payer: Self-pay | Admitting: Internal Medicine

## 2012-02-08 DIAGNOSIS — J309 Allergic rhinitis, unspecified: Secondary | ICD-10-CM | POA: Diagnosis not present

## 2012-02-08 NOTE — Telephone Encounter (Signed)
Pt called and is req to get refill of HYDROcodone-acetaminophen (NORCO) 7.5-325 MG per tablet [facility] [address]

## 2012-02-09 ENCOUNTER — Telehealth: Payer: Self-pay | Admitting: *Deleted

## 2012-02-09 MED ORDER — HYDROCODONE-ACETAMINOPHEN 7.5-325 MG PO TABS: 1.0000 | ORAL_TABLET | Freq: Three times a day (TID) | ORAL | Status: DC | PRN

## 2012-02-09 NOTE — Telephone Encounter (Signed)
Called in.

## 2012-02-13 DIAGNOSIS — J309 Allergic rhinitis, unspecified: Secondary | ICD-10-CM | POA: Diagnosis not present

## 2012-02-15 DIAGNOSIS — J309 Allergic rhinitis, unspecified: Secondary | ICD-10-CM | POA: Diagnosis not present

## 2012-02-20 DIAGNOSIS — J309 Allergic rhinitis, unspecified: Secondary | ICD-10-CM | POA: Diagnosis not present

## 2012-02-22 DIAGNOSIS — J309 Allergic rhinitis, unspecified: Secondary | ICD-10-CM | POA: Diagnosis not present

## 2012-02-23 DIAGNOSIS — R413 Other amnesia: Secondary | ICD-10-CM | POA: Diagnosis not present

## 2012-02-29 DIAGNOSIS — J309 Allergic rhinitis, unspecified: Secondary | ICD-10-CM | POA: Diagnosis not present

## 2012-03-07 DIAGNOSIS — J309 Allergic rhinitis, unspecified: Secondary | ICD-10-CM | POA: Diagnosis not present

## 2012-03-11 ENCOUNTER — Other Ambulatory Visit: Payer: Self-pay | Admitting: Internal Medicine

## 2012-03-12 DIAGNOSIS — J309 Allergic rhinitis, unspecified: Secondary | ICD-10-CM | POA: Diagnosis not present

## 2012-03-14 DIAGNOSIS — J309 Allergic rhinitis, unspecified: Secondary | ICD-10-CM | POA: Diagnosis not present

## 2012-03-18 DIAGNOSIS — J309 Allergic rhinitis, unspecified: Secondary | ICD-10-CM | POA: Diagnosis not present

## 2012-03-19 ENCOUNTER — Ambulatory Visit: Payer: Medicare Other | Admitting: Internal Medicine

## 2012-03-21 DIAGNOSIS — J309 Allergic rhinitis, unspecified: Secondary | ICD-10-CM | POA: Diagnosis not present

## 2012-03-24 ENCOUNTER — Other Ambulatory Visit: Payer: Self-pay | Admitting: Internal Medicine

## 2012-03-28 ENCOUNTER — Other Ambulatory Visit: Payer: Self-pay | Admitting: Internal Medicine

## 2012-03-28 ENCOUNTER — Encounter: Payer: Self-pay | Admitting: Internal Medicine

## 2012-03-28 ENCOUNTER — Ambulatory Visit (INDEPENDENT_AMBULATORY_CARE_PROVIDER_SITE_OTHER): Payer: Medicare Other | Admitting: Internal Medicine

## 2012-03-28 VITALS — BP 124/80 | HR 72 | Temp 98.3°F | Resp 16 | Ht 67.0 in | Wt 206.0 lb

## 2012-03-28 DIAGNOSIS — J309 Allergic rhinitis, unspecified: Secondary | ICD-10-CM | POA: Diagnosis not present

## 2012-03-28 DIAGNOSIS — R413 Other amnesia: Secondary | ICD-10-CM

## 2012-03-28 DIAGNOSIS — M67919 Unspecified disorder of synovium and tendon, unspecified shoulder: Secondary | ICD-10-CM

## 2012-03-28 DIAGNOSIS — I10 Essential (primary) hypertension: Secondary | ICD-10-CM

## 2012-03-28 DIAGNOSIS — F1010 Alcohol abuse, uncomplicated: Secondary | ICD-10-CM

## 2012-03-28 DIAGNOSIS — T887XXA Unspecified adverse effect of drug or medicament, initial encounter: Secondary | ICD-10-CM

## 2012-03-28 DIAGNOSIS — IMO0001 Reserved for inherently not codable concepts without codable children: Secondary | ICD-10-CM

## 2012-03-28 DIAGNOSIS — M7552 Bursitis of left shoulder: Secondary | ICD-10-CM

## 2012-03-28 MED ORDER — METHYLPREDNISOLONE ACETATE 40 MG/ML IJ SUSP: 40.0000 mg | Freq: Once | INTRAMUSCULAR | Status: DC

## 2012-03-28 MED ORDER — DOXEPIN HCL 25 MG PO CAPS: 25.0000 mg | ORAL_CAPSULE | Freq: Every day | ORAL | Status: DC

## 2012-03-28 NOTE — Patient Instructions (Signed)
The patient is instructed to continue all medications as prescribed. Schedule followup with check out clerk upon leaving the clinic  

## 2012-03-28 NOTE — Progress Notes (Signed)
Subjective:    Patient ID: Aimee Jones, female    DOB: 10-27-1945, 66 y.o.   MRN: [id number]  HPI  Patient is a 66 year old female who presents for followup of hyperlipidemia gastroesophageal reflux and a history of osteoarthritis.  She also has a history of fibromyalgia.  The patient has an acute complaint today of left shoulder pain she has a history of surgical intervention for rotator cuff disease on her right shoulder now she has noticed increasing pain in her left shoulder.  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    Review of Systems  Constitutional: Negative for activity change, appetite change and fatigue.  HENT: Negative for ear pain, congestion, neck pain, postnasal drip and sinus pressure.   Eyes: Negative for redness and visual disturbance.  Respiratory: Negative for cough, shortness of breath and wheezing.   Gastrointestinal: Negative for abdominal pain and abdominal distention.  Genitourinary: Negative for dysuria, frequency and menstrual problem.  Musculoskeletal: Negative for myalgias, joint swelling and arthralgias.  Skin: Negative for rash and wound.  Neurological: Negative for dizziness, weakness and headaches.  Hematological: Negative for adenopathy. Does not bruise/bleed easily.  Psychiatric/Behavioral: Negative for disturbed wake/sleep cycle and decreased concentration.   Past Medical History  Diagnosis Date  . Hyperlipidemia   . ETOH abuse   . Allergy   . Diverticulosis   . Hypertension   . Depression   . Arthritis   . Asthma   . GERD (gastroesophageal reflux disease)   . Normal 24 hour ambulatory pH monitoring study     good acid suppression    History   Social History  . Marital Status: Married    Spouse Name: N/A    Number of  Children: N/A  . Years of Education: N/A   Occupational History  . Not on file.   Social History Main Topics  . Smoking status: Never Smoker   . Smokeless tobacco: Not on file  . Alcohol Use: Yes  . Drug Use: No  . Sexually Active: Yes   Other Topics Concern  . Not on file   Social History Narrative  . No narrative on file    Past Surgical History  Procedure Date  . Dobutamine stress echo 07/09/2001    normal  . Esophagogastroduodenoscopy 06/11/2006    normal  . Rotator cuff repair   . Abdominal hysterectomy   . Total hip arthroplasty     left 1991/right 2008  . Nasal sinus surgery 1990    No family history on file.  Allergies  Allergen Reactions  . Latex   . Penicillins     Current Outpatient Prescriptions on File Prior to Visit  Medication Sig Dispense Refill  . ABILIFY 2 MG tablet TAKE 1 TABLET BY MOUTH EVERY DAY  90 tablet  0  . ALPRAZolam (XANAX) 0.5 MG tablet Take 1 tablet (0.5 mg total) by mouth 3 (three) times daily as needed for sleep or anxiety.  90 tablet  1  . ALPRAZolam (XANAX) 0.5 MG tablet Take 1 tablet (0.5 mg total) by mouth 3 (three) times daily as needed.  180 tablet  1  . ascorbic acid (VITAMIN C) 500 MG tablet Take 500 mg by mouth daily.        Marland Kitchen atorvastatin (LIPITOR) 10 MG tablet TAKE 1 TABLET EVERY DAY  90 tablet  3  . B Complex-C (SUPER B COMPLEX PO) Take by mouth daily.        . budesonide-formoterol (SYMBICORT) 160-4.5 MCG/ACT inhaler Inhale 2 puffs into the lungs 2 (two) times daily.  1 Inhaler  12  . CELEBREX 200 MG capsule TAKE ONE CAPSULE EVERY DAY  90 capsule  1  . citalopram (CELEXA) 20 MG tablet TAKE 1 TABLET BY MOUTH EVERY DAY  30 tablet  6  . donepezil (ARICEPT) 10 MG tablet Take 10 mg by mouth at bedtime.      Marland Kitchen doxepin (SINEQUAN) 25 MG capsule TAKE ONE CAPSULE AT BEDTIME AS NEEDED SLEEP  30 capsule  0  . estrogens, conjugated, (PREMARIN) 0.625 MG tablet Take 1 tablet (0.625 mg total) by mouth daily. Take daily for 21 days then  do not take for 7 days.  30 tablet  6  . fish oil-omega-3 fatty acids 1000 MG capsule Take 2 g by mouth daily.        . fluticasone (FLONASE) 50 MCG/ACT nasal spray USE ONCE DAILY  48 g  11  . HYDROcodone-acetaminophen (NORCO) 7.5-325 MG per tablet Take 1 tablet by mouth every 8 (eight) hours as needed (must last 1 month).  60 tablet  0  . olmesartan-hydrochlorothiazide (BENICAR HCT) 40-12.5 MG per tablet Take 1 tablet by mouth daily.        . promethazine (PHENERGAN) 25 MG tablet TAKE 1 TABLET BY MOUTH EVERY 6 HOURS AS NEEDED FOR NAUSEA  20 tablet  0  . DISCONTD: phentermine 37.5 MG capsule Take 1 capsule (37.5 mg total) by mouth every morning.  30 capsule  0  . DISCONTD: pantoprazole (PROTONIX) 40 MG tablet Take 1 tablet (40 mg total) by mouth daily.  90 tablet  3    BP 124/80  Pulse 72  Temp 98.3 F (36.8 C)  Resp 16  Ht 5\' 7"  (1.702 m)  Wt 206 lb (93.441 kg)  BMI 32.26 kg/m2       Objective:   Physical Exam  Nursing note and vitals reviewed. Constitutional: She is oriented to person, place, and time. She appears well-developed and well-nourished. No distress.  HENT:  Head: Normocephalic and atraumatic.  Right Ear: External ear normal.  Left Ear: External ear normal.  Nose: Nose normal.  Mouth/Throat: Oropharynx is clear and moist.  Eyes: Conjunctivae and EOM are normal. Pupils are equal, round, and reactive to light.  Neck: Normal range of motion. Neck supple. No JVD present. No tracheal deviation present. No thyromegaly present.  Cardiovascular: Normal rate, regular rhythm, normal heart sounds and intact distal pulses.   No murmur heard. Pulmonary/Chest: Effort normal and breath sounds normal. She has no wheezes. She exhibits no tenderness.  Abdominal: Soft. Bowel sounds are normal.  Musculoskeletal: Normal range of motion. She exhibits no edema and no tenderness.  Lymphadenopathy:    She has no cervical adenopathy.  Neurological: She is alert and oriented to person,  place, and time. She has normal reflexes. No cranial nerve deficit.  Skin: Skin is warm and dry. She is not diaphoretic.  Psychiatric: She has a normal mood and affect. Her behavior is normal.          Assessment & Plan:    Informed consent obtained and the patient's left shoulder was prepped with betadine. Local anesthesia was obtained with topical spray. Then 40 mg of Depo-Medrol and 1/2 cc of lidocaine was injected into the joint space. The patient tolerated the procedure without complications.  Post injection care discussed with patient. The patient's blood pressure and medication side effects we will look at a basic metabolic panel and a liver panel today.   Patient's asthma has been stable.  Patient's hypertension is stable with a good reading today.  Patient's GERD is stable.  Patient has begun Aricept for memory disorder without any reported side effects tolerating the medication well

## 2012-04-02 DIAGNOSIS — J309 Allergic rhinitis, unspecified: Secondary | ICD-10-CM | POA: Diagnosis not present

## 2012-04-04 DIAGNOSIS — J309 Allergic rhinitis, unspecified: Secondary | ICD-10-CM | POA: Diagnosis not present

## 2012-04-11 ENCOUNTER — Other Ambulatory Visit: Payer: Self-pay | Admitting: Internal Medicine

## 2012-04-12 ENCOUNTER — Other Ambulatory Visit: Payer: Self-pay | Admitting: *Deleted

## 2012-04-12 DIAGNOSIS — J309 Allergic rhinitis, unspecified: Secondary | ICD-10-CM | POA: Diagnosis not present

## 2012-04-12 MED ORDER — HYDROCODONE-ACETAMINOPHEN 7.5-325 MG PO TABS
1.0000 | ORAL_TABLET | Freq: Three times a day (TID) | ORAL | Status: DC | PRN
Start: 2012-04-12 — End: 2012-06-18

## 2012-04-21 ENCOUNTER — Other Ambulatory Visit: Payer: Self-pay | Admitting: Internal Medicine

## 2012-05-02 DIAGNOSIS — K5730 Diverticulosis of large intestine without perforation or abscess without bleeding: Secondary | ICD-10-CM | POA: Diagnosis not present

## 2012-05-02 DIAGNOSIS — R109 Unspecified abdominal pain: Secondary | ICD-10-CM | POA: Diagnosis not present

## 2012-05-02 DIAGNOSIS — K5732 Diverticulitis of large intestine without perforation or abscess without bleeding: Secondary | ICD-10-CM | POA: Diagnosis not present

## 2012-05-07 DIAGNOSIS — J309 Allergic rhinitis, unspecified: Secondary | ICD-10-CM | POA: Diagnosis not present

## 2012-05-07 DIAGNOSIS — H1045 Other chronic allergic conjunctivitis: Secondary | ICD-10-CM | POA: Diagnosis not present

## 2012-05-07 DIAGNOSIS — J45909 Unspecified asthma, uncomplicated: Secondary | ICD-10-CM | POA: Diagnosis not present

## 2012-05-07 DIAGNOSIS — J3089 Other allergic rhinitis: Secondary | ICD-10-CM | POA: Diagnosis not present

## 2012-05-09 DIAGNOSIS — J309 Allergic rhinitis, unspecified: Secondary | ICD-10-CM | POA: Diagnosis not present

## 2012-05-14 DIAGNOSIS — J309 Allergic rhinitis, unspecified: Secondary | ICD-10-CM | POA: Diagnosis not present

## 2012-05-15 DIAGNOSIS — L600 Ingrowing nail: Secondary | ICD-10-CM | POA: Diagnosis not present

## 2012-05-15 DIAGNOSIS — L03039 Cellulitis of unspecified toe: Secondary | ICD-10-CM | POA: Diagnosis not present

## 2012-05-16 DIAGNOSIS — J309 Allergic rhinitis, unspecified: Secondary | ICD-10-CM | POA: Diagnosis not present

## 2012-05-21 DIAGNOSIS — J309 Allergic rhinitis, unspecified: Secondary | ICD-10-CM | POA: Diagnosis not present

## 2012-05-23 DIAGNOSIS — J309 Allergic rhinitis, unspecified: Secondary | ICD-10-CM | POA: Diagnosis not present

## 2012-05-28 DIAGNOSIS — J309 Allergic rhinitis, unspecified: Secondary | ICD-10-CM | POA: Diagnosis not present

## 2012-05-31 ENCOUNTER — Other Ambulatory Visit: Payer: Self-pay | Admitting: *Deleted

## 2012-05-31 MED ORDER — DONEPEZIL HCL 10 MG PO TABS: 10.0000 mg | ORAL_TABLET | Freq: Every day | ORAL | Status: DC

## 2012-06-04 DIAGNOSIS — J309 Allergic rhinitis, unspecified: Secondary | ICD-10-CM | POA: Diagnosis not present

## 2012-06-10 ENCOUNTER — Ambulatory Visit (INDEPENDENT_AMBULATORY_CARE_PROVIDER_SITE_OTHER): Payer: Medicare Other | Admitting: Family

## 2012-06-10 ENCOUNTER — Telehealth: Payer: Self-pay | Admitting: Family Medicine

## 2012-06-10 ENCOUNTER — Encounter: Payer: Self-pay | Admitting: Family

## 2012-06-10 VITALS — BP 102/70 | Temp 98.4°F | Wt 206.0 lb

## 2012-06-10 DIAGNOSIS — M25519 Pain in unspecified shoulder: Secondary | ICD-10-CM | POA: Diagnosis not present

## 2012-06-10 DIAGNOSIS — M25512 Pain in left shoulder: Secondary | ICD-10-CM

## 2012-06-10 NOTE — Telephone Encounter (Signed)
Rushie Goltz, please advise on this:   [address] [place], [place] [address] p. [phone] f. [phone] To: [facility] [place] (After Hours Triage) Fax: [phone] From: [facility] Date/ Time: 06/09/2012 7:28 PM Taken By: Forbes Cellar, CSR Caller: Elinor Dodge Facility: home Patient: Aimee Jones, Aimee Jones DOB: 1946-03-23 Phone: [phone] Reason for Call: Message for Kendal Hymen Pt would like to come around 10:30 when she brings her aunt in and get a cortisone shot in her left shoulder at that time. Regarding Appointment: Appt Date: Appt Time: Unknown Provider: Reason: Details: Outcome:

## 2012-06-10 NOTE — Telephone Encounter (Signed)
Ov with padonda today at 1-;30-pt aware

## 2012-06-10 NOTE — Progress Notes (Signed)
Subjective:    Patient ID: Aimee Jones, female    DOB: [birth year], 66 y.o.   MRN: [id number]  HPI 66 year old WF, patient of Dr. Lovell Sheehan is in today with c/o left shoulder pain and requesting a joint injection. She has a history of Fibromyalgia and Osteoarthritis. She takes hydrocodone that helps. Worse with movement. Rates her pain 7/10. She has had surgery to the right shoulder. Pain does not radiate.    Review of Systems  Constitutional: Negative.   Respiratory: Negative.   Cardiovascular: Negative.   Musculoskeletal:       Left shoulder pain  Neurological: Negative.   Hematological: Negative.   Psychiatric/Behavioral: Negative.    Past Medical History  Diagnosis Date  . Hyperlipidemia   . ETOH abuse   . Allergy   . Diverticulosis   . Hypertension   . Depression   . Arthritis   . Asthma   . GERD (gastroesophageal reflux disease)   . Normal 24 hour ambulatory pH monitoring study     good acid suppression    History   Social History  . Marital Status: Married    Spouse Name: N/A    Number of Children: N/A  . Years of Education: N/A   Occupational History  . Not on file.   Social History Main Topics  . Smoking status: Never Smoker   . Smokeless tobacco: Not on file  . Alcohol Use: Yes  . Drug Use: No  . Sexually Active: Yes   Other Topics Concern  . Not on file   Social History Narrative  . No narrative on file    Past Surgical History  Procedure Date  . Dobutamine stress echo 07/09/2001    normal  . Esophagogastroduodenoscopy 06/11/2006    normal  . Rotator cuff repair   . Abdominal hysterectomy   . Total hip arthroplasty     left 1991/right 2008  . Nasal sinus surgery 1990    No family history on file.  Allergies  Allergen Reactions  . Latex   . Penicillins     Current Outpatient Prescriptions on File Prior to Visit  Medication Sig Dispense Refill  . ABILIFY 2 MG tablet TAKE 1 TABLET BY MOUTH EVERY DAY  90 tablet  0  . ALPRAZolam  (XANAX) 0.5 MG tablet Take 1 tablet (0.5 mg total) by mouth 3 (three) times daily as needed for sleep or anxiety.  90 tablet  1  . ALPRAZolam (XANAX) 0.5 MG tablet Take 1 tablet (0.5 mg total) by mouth 3 (three) times daily as needed.  180 tablet  1  . ascorbic acid (VITAMIN C) 500 MG tablet Take 500 mg by mouth daily.        Marland Kitchen atorvastatin (LIPITOR) 10 MG tablet TAKE 1 TABLET EVERY DAY  90 tablet  3  . B Complex-C (SUPER B COMPLEX PO) Take by mouth daily.        Marland Kitchen BENICAR HCT 40-12.5 MG per tablet TAKE 1 TABLET BY MOUTH DAILY  90 tablet  1  . budesonide-formoterol (SYMBICORT) 160-4.5 MCG/ACT inhaler Inhale 2 puffs into the lungs 2 (two) times daily.  1 Inhaler  12  . CELEBREX 200 MG capsule TAKE ONE CAPSULE EVERY DAY  90 capsule  1  . citalopram (CELEXA) 20 MG tablet TAKE 1 TABLET BY MOUTH EVERY DAY  30 tablet  6  . donepezil (ARICEPT) 10 MG tablet Take 1 tablet (10 mg total) by mouth at bedtime.  30 tablet  2  .  doxepin (SINEQUAN) 25 MG capsule Take 1 capsule (25 mg total) by mouth at bedtime.  30 capsule  0  . estrogens, conjugated, (PREMARIN) 0.625 MG tablet Take 1 tablet (0.625 mg total) by mouth daily. Take daily for 21 days then do not take for 7 days.  30 tablet  6  . fish oil-omega-3 fatty acids 1000 MG capsule Take 2 g by mouth daily.        . fluticasone (FLONASE) 50 MCG/ACT nasal spray USE ONCE DAILY  48 g  11  . HYDROcodone-acetaminophen (NORCO) 7.5-325 MG per tablet Take 1 tablet by mouth every 8 (eight) hours as needed (must last 1 month).  60 tablet  0  . pantoprazole (PROTONIX) 40 MG tablet TAKE 1 TABLET DAILY  90 tablet  3  . phentermine 37.5 MG capsule Take 37.5 mg by mouth every morning.      . promethazine (PHENERGAN) 25 MG tablet TAKE 1 TABLET BY MOUTH EVERY 6 HOURS AS NEEDED FOR NAUSEA  20 tablet  0   Current Facility-Administered Medications on File Prior to Visit  Medication Dose Route Frequency Provider Last Rate Last Dose  . methylPREDNISolone acetate (DEPO-MEDROL)  injection 40 mg  40 mg Intra-articular Once Stacie Glaze, MD        BP 102/70  Temp 98.4 F (36.9 C) (Oral)  Wt 206 lb (93.441 kg)chart    Objective:   Physical Exam  Constitutional: She is oriented to person, place, and time. She appears well-developed and well-nourished.  Cardiovascular: Normal rate, regular rhythm and normal heart sounds.   Pulmonary/Chest: Effort normal and breath sounds normal.  Musculoskeletal: She exhibits no edema.       Tenderness to palpation of the anterior left shoulder. Pain with extension and internal rotation. Radial pulses 2/2.   Neurological: She is alert and oriented to person, place, and time.  Skin: Skin is warm and dry.  Psychiatric: She has a normal mood and affect.      Informed consent obtained. Under clean technique, area cleansed with sterile alcohol swab. 1.5cc of lidocaine injected into the joint space of the left shoulder, followed by 60mg  of Depo Medrol. Patient tolerated the procedure well.     Assessment & Plan:  Assessment: Left shoulder pain, Fibromyalgia  Plan: Recheck with PCP as scheduled.

## 2012-06-10 NOTE — Patient Instructions (Addendum)
Shoulder Range of Motion Exercises  The shoulder is the most flexible joint in the human body. Because of this it is also the most unstable joint in the body. All ages can develop shoulder problems. Early treatment of problems is necessary for a good outcome. People react to shoulder pain by decreasing the movement of the joint. After a brief period of time, the shoulder can become "frozen". This is an almost complete loss of the ability to move the damaged shoulder. Following injuries your caregivers can give you instructions on exercises to keep your range of motion (ability to move your shoulder freely), or regain it if it has been lost.    EXERCISES  EXERCISES TO MAINTAIN THE MOBILITY OF YOUR SHOULDER:  Codman's Exercise or Pendulum Exercise   This exercise may be performed in a prone (face-down) lying position or standing while leaning on a chair with the opposite arm. Its purpose is to relax the muscles in your shoulder and slowly but surely increase the range of motion and to relieve pain.    Lie on your stomach close to the side edge of the bed. Let your weak arm hang over the edge of the bed. Relax your shoulder, arm and hand. Let your shoulder blade relax and drop down.    Slowly and gently swing your arm forward and back. Do not use your neck muscles; relax them. It might be easier to have someone else gently start swinging your arm.    As pain decreases, increase your swing. To start, arm swing should begin at 15 degree angles. In time and as pain lessens, move to 30-45 degree angles. Start with swinging for about 15 seconds, and work towards swinging for 3 to 5 minutes.    This exercise may also be performed in a standing/bent over position.    Stand and hold onto a sturdy chair with your good arm. Bend forward at the waist and bend your knees slightly to help protect your back. Relax your weak arm, let it hang limp. Relax your shoulder blade and let it drop.     Keep your shoulder relaxed and use body motion to swing your arm in small circles.    Stand up tall and relax.    Repeat motion and change direction of circles.    Start with swinging for about 30 seconds, and work towards swinging for 3 to 5 minutes.   STRETCHING EXERCISES:   Lift your arm out in front of you with the elbow bent at 90 degrees. Using your other arm gently pull the elbow forward and across your body.    Bend one arm behind you with the palm facing outward. Using the other arm, hold a towel or rope and reach this arm up above your head, then bend it at the elbow to move your wrist to behind your neck. Grab the free end of the towel with the hand behind your back. Gently pull the towel up with the hand behind your neck, gradually increasing the pull on the hand behind the small of your back. Then, gradually pull down with the hand behind the small of your back. This will pull the hand and arm behind your neck further. Both shoulders will have an increased range of motion with repetition of this exercise.   STRENGTHENING EXERCISES:   Standing with your arm at your side and straight out from your shoulder with the elbow bent at 90 degrees, hold onto a small weight and slowly raise   your hand so it points straight up in the air. Repeat this five times to begin with, and gradually increase to ten times. Do this four times per day. As you grow stronger you can gradually increase the weight.    Repeat the above exercise, only this time using an elastic band. Start with your hand up in the air and pull down until your hand is by your side. As you grow stronger, gradually increase the amount you pull by increasing the number or size of the elastic bands. Use the same amount of repetitions.     Standing with your hand at your side and holding onto a weight, gradually lift the hand in front of you until it is over your head. Do the same also with the hand remaining at your side and lift the hand away from your body until it is again over your head. Repeat this five times to begin with, and gradually increase to ten times. Do this four times per day. As you grow stronger you can gradually increase the weight.   Document Released: 07/01/2003 Document Revised: 09/21/2011 Document Reviewed: 10/02/2005  [employer] Patient Information 2012 [employer].

## 2012-06-11 DIAGNOSIS — J309 Allergic rhinitis, unspecified: Secondary | ICD-10-CM | POA: Diagnosis not present

## 2012-06-18 ENCOUNTER — Other Ambulatory Visit: Payer: Self-pay | Admitting: *Deleted

## 2012-06-18 DIAGNOSIS — J309 Allergic rhinitis, unspecified: Secondary | ICD-10-CM | POA: Diagnosis not present

## 2012-06-18 MED ORDER — HYDROCODONE-ACETAMINOPHEN 7.5-325 MG PO TABS: 1.0000 | ORAL_TABLET | Freq: Three times a day (TID) | ORAL | Status: DC | PRN

## 2012-06-25 DIAGNOSIS — Z23 Encounter for immunization: Secondary | ICD-10-CM | POA: Diagnosis not present

## 2012-06-25 DIAGNOSIS — J309 Allergic rhinitis, unspecified: Secondary | ICD-10-CM | POA: Diagnosis not present

## 2012-07-01 ENCOUNTER — Ambulatory Visit: Payer: Medicare Other | Admitting: Family Medicine

## 2012-07-02 ENCOUNTER — Encounter: Payer: Self-pay | Admitting: Family Medicine

## 2012-07-02 ENCOUNTER — Ambulatory Visit (INDEPENDENT_AMBULATORY_CARE_PROVIDER_SITE_OTHER)
Admission: RE | Admit: 2012-07-02 | Discharge: 2012-07-02 | Disposition: A | Discharge status: Home / Self Care | Payer: Medicare Other | Source: Ambulatory Visit | Attending: Family Medicine | Admitting: Family Medicine

## 2012-07-02 ENCOUNTER — Ambulatory Visit (INDEPENDENT_AMBULATORY_CARE_PROVIDER_SITE_OTHER): Payer: Medicare Other | Admitting: Family Medicine

## 2012-07-02 VITALS — BP 140/88 | Temp 98.2°F | Wt 207.0 lb

## 2012-07-02 DIAGNOSIS — M25519 Pain in unspecified shoulder: Secondary | ICD-10-CM

## 2012-07-02 DIAGNOSIS — M25512 Pain in left shoulder: Secondary | ICD-10-CM

## 2012-07-02 DIAGNOSIS — M7500 Adhesive capsulitis of unspecified shoulder: Secondary | ICD-10-CM | POA: Diagnosis not present

## 2012-07-02 DIAGNOSIS — M19019 Primary osteoarthritis, unspecified shoulder: Secondary | ICD-10-CM | POA: Diagnosis not present

## 2012-07-02 DIAGNOSIS — J309 Allergic rhinitis, unspecified: Secondary | ICD-10-CM | POA: Diagnosis not present

## 2012-07-02 NOTE — Progress Notes (Signed)
  Subjective:    Patient ID: Aimee Jones, female    DOB: 05/03/1946, 66 y.o.   MRN: [id number]  HPI  Progressive left shoulder pain. Similar problems right shoulder several years ago. That shoulder pain eventually improved. No injury. Pain is somewhat poorly localized but mostly around lateral region left shoulder. Progressive decreased range of motion. Duration over 6 months. Pain with abduction and internal rotation. Progressive loss of range of motion over the past several weeks. Takes Celebrex regularly also supplements with hydrocodone. Denies neck pain. No numbness. Pain greater than weakness.   Review of Systems  Respiratory: Negative for cough and shortness of breath.   Cardiovascular: Negative for chest pain.  Neurological: Negative for weakness.       Objective:   Physical Exam  Constitutional: She appears well-developed and well-nourished.  Cardiovascular: Normal rate and regular rhythm.   Pulmonary/Chest: Effort normal and breath sounds normal. No respiratory distress.  Musculoskeletal:       Left shoulder reveals no visible edema. No ecchymosis. No erythema. She has limited range of motion with abduction and rotation secondary to pain. No a.c. joint tenderness. No biceps tenderness.  Neurological:       Strength is difficult to gauge because of her pain. No definite rotator cuff weakness. Symmetric reflexes. Normal sensory function          Assessment & Plan:  Left shoulder pain. Suspect evolving adhesive capsulitis. Probably has underlying rotator cuff tendinitis- chronic for several months. Obtain x-rays left shoulder. Already on nonsteroidals without improvement. We offered steroid injection left shoulder followed by range of motion exercises and patient consents.  Discussed risks and benefits of corticosteroid injection and patient consented.  After prepping skin with betadine, injected 40 mg depomedrol and 2 cc of plain xylocaine with 23 gauge one and one half  inch needle using posterior lateral approach and pt tolerate well. We suggested formal physical therapy but she prefers to try some stretches on her own first

## 2012-07-02 NOTE — Patient Instructions (Addendum)
Adhesive Capsulitis Sometimes the shoulder becomes stiff and is painful to move. Some people say it feels as if the shoulder is frozen in place. Because of this, the condition is called "frozen shoulder." Its medical name is adhesive capsulitis.  The shoulder joint is made up of strong connective tissue that attaches the ball of the humerus to the shallow shoulder socket. This strong connective tissue is called the joint capsule. This tissue can become stiff and swollen. That is when adhesive capsulitis sets in. CAUSES  It is not always clear just what the cause adhesive capsulitis. Possibilities include:  Injury to the shoulder joint.   Strain. This is a repetitive injury brought about by overuse.   Lack of use. Perhaps your arm or hand was otherwise injured. It might have been in a sling for awhile. Or perhaps you were not using it to avoid pain.   Referred pain. This is a sort of trick the body plays. You feel pain in the shoulder. But, the pain actually comes from an injury somewhere else in the body.   Long-standing health problems. Several diseases can cause adhesive capsulitis. They include diabetes, heart disease, stroke, thyroid problems, rheumatoid arthritis and lung disease.   Being a women older than 40. Anyone can develop adhesive capsulitis but it is most common in women in this age group.  SYMPTOMS   Pain.   It occurs when the arm is moved.   Parts of the shoulder might hurt if they are touched.   Pain is worse at night or when resting.   Soreness. It might not be strong enough to be called pain. But, the shoulder aches.   The shoulder does not move freely.   Muscle spasms.   Trouble sleeping because of shoulder ache or pain.  DIAGNOSIS  To decide if you have adhesive capsulitis, your healthcare provider will probably:  Ask about symptoms you have noticed.   Ask about your history of joint pain and anything that might have caused the pain.   Ask about your  overall health.   Use hands to feel your shoulder and neck.   Ask you to move your shoulder in specific directions. This may indicate the origin of the pain.   Order imaging tests; pictures of the shoulder. They help pinpoint the source of the problem. An X-ray might be used. For more detail, an MRI is often used. An MRI details the tendons, muscles and ligaments as well as the joint.  TREATMENT  Adhesive capsulitis can be treated several ways. Most treatments can be done in a clinic or in your healthcare provider's office. Be sure to discuss the different options with your caregiver. They include:  Physical therapy. You will work on specific exercises to get your shoulder moving again. The exercises usually involve stretching. A physical therapist (a caregiver with special training) can show you what to do and what not to do. The exercises will need to be done daily.   Medication.   Over-the-counter medicines may relieve pain and inflammation (the body's way of reacting to injury or infection).   Corticosteroids. These are stronger drugs to reduce pain and inflammation. They are given by injection (shots) into the shoulder joint. Frequent treatment is not recommended.   Muscle relaxants. Medication may be prescribed to ease muscle spasms.   Treatment of underlying conditions. This means treating another condition that is causing your shoulder problem. This might be a rotator cuff (tendon) problem   Shoulder manipulation. The shoulder will   be moved by your healthcare provider. You would be under general anesthesia (given a drug that puts you to sleep). You would not feel anything. Sometimes the joint will be injected with salt water (saline) at high pressure to break down internal scarring in the joint capsule.   Surgery. This is rarely needed. It may be suggested in advanced cases after all other treatment has failed.  PROGNOSIS  In time, most people recover from adhesive capsulitis.  Sometimes, however, the pain goes away but full movement of the shoulder does not return.  HOME CARE INSTRUCTIONS   Take any pain medications recommended by your healthcare provider. Follow the directions carefully.   If you have physical therapy, follow through with the therapist's suggestions. Be sure you understand the exercises you will be doing. You should understand:   How often the exercises should be done.   How many times each exercise should be repeated.   How long they should be done.   What other activities you should do, or not do.   That you should warm up before doing any exercise. Just 5 to 10 minutes will help. Small, gentle movements should get your shoulder ready for more.   Avoid high-demand exercise that involves your shoulder such as throwing. This type of exercise can make pain worse.   Consider using cold packs. Cold may ease swelling and pain. Ask your healthcare provider if a cold pack might help you. If so, get directions on how and when to use them.  SEEK MEDICAL CARE IF:   You have any questions about your medications.   Your pain continues to increase.  Document Released: 07/30/2009 Document Revised: 09/21/2011 Document Reviewed: 07/30/2009 [employer] Patient Information 2012 [employer]. 

## 2012-07-02 NOTE — Progress Notes (Signed)
Quick Note:  Called and spoke with pt and pt is aware. ______ 

## 2012-07-09 ENCOUNTER — Telehealth: Payer: Self-pay | Admitting: Internal Medicine

## 2012-07-09 NOTE — Telephone Encounter (Signed)
Pharmacy needs clarification on the denial for HYDROcodone-acetaminophen (NORCO) 7.5-325 MG per tablet. Pt used #60 within 20 day time period, which was a 20 day suppply. Does doctor want script to last for 30 day? Pls let pharmacy know.

## 2012-07-09 NOTE — Telephone Encounter (Signed)
Left message on machine At pharmacy- dr Lovell Sheehan put on script that this must last a month

## 2012-07-10 ENCOUNTER — Other Ambulatory Visit: Payer: Self-pay | Admitting: Internal Medicine

## 2012-07-11 DIAGNOSIS — J309 Allergic rhinitis, unspecified: Secondary | ICD-10-CM | POA: Diagnosis not present

## 2012-07-23 DIAGNOSIS — J309 Allergic rhinitis, unspecified: Secondary | ICD-10-CM | POA: Diagnosis not present

## 2012-07-26 ENCOUNTER — Ambulatory Visit (INDEPENDENT_AMBULATORY_CARE_PROVIDER_SITE_OTHER): Payer: Medicare Other | Admitting: Internal Medicine

## 2012-07-26 ENCOUNTER — Other Ambulatory Visit: Payer: Self-pay | Admitting: Internal Medicine

## 2012-07-26 ENCOUNTER — Encounter: Payer: Self-pay | Admitting: Internal Medicine

## 2012-07-26 VITALS — BP 134/80 | HR 72 | Temp 98.6°F | Resp 16 | Ht 67.0 in | Wt 208.0 lb

## 2012-07-26 DIAGNOSIS — I10 Essential (primary) hypertension: Secondary | ICD-10-CM

## 2012-07-26 DIAGNOSIS — M67919 Unspecified disorder of synovium and tendon, unspecified shoulder: Secondary | ICD-10-CM | POA: Diagnosis not present

## 2012-07-26 DIAGNOSIS — M25519 Pain in unspecified shoulder: Secondary | ICD-10-CM | POA: Diagnosis not present

## 2012-07-26 DIAGNOSIS — F329 Major depressive disorder, single episode, unspecified: Secondary | ICD-10-CM | POA: Diagnosis not present

## 2012-07-26 DIAGNOSIS — G20 Parkinson's disease: Secondary | ICD-10-CM | POA: Diagnosis not present

## 2012-07-26 DIAGNOSIS — M719 Bursopathy, unspecified: Secondary | ICD-10-CM | POA: Diagnosis not present

## 2012-07-26 DIAGNOSIS — M25512 Pain in left shoulder: Secondary | ICD-10-CM

## 2012-07-26 DIAGNOSIS — M7552 Bursitis of left shoulder: Secondary | ICD-10-CM

## 2012-07-26 MED ORDER — ARIPIPRAZOLE 2 MG PO TABS: 2.0000 mg | ORAL_TABLET | Freq: Every day | ORAL | Status: DC

## 2012-07-26 MED ORDER — METHYLPREDNISOLONE ACETATE 40 MG/ML IJ SUSP: 40.0000 mg | Freq: Once | INTRAMUSCULAR | Status: DC

## 2012-07-26 NOTE — Progress Notes (Signed)
Subjective:    Patient ID: Aimee Jones, female    DOB: [birth year], 66 y.o.   MRN: [id number]  HPI Shoulder pain has increased recently she had an injection 4 months ago which dramatically alleviated some of her bursitis pain the working diagnosis is bursitis she has limited range of motion due to pain she had an x-ray which showed preserved joint space. Has used a home "tens" unit and this helps. We refilled pain medications with a limited prescription for Norco without refills her last prescription lasted for 3 months.  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   Eyes: Negative.   Respiratory: Negative.   Cardiovascular: Negative.   Genitourinary: Positive for frequency.  Musculoskeletal: Positive for myalgias, joint swelling, arthralgias and gait problem.  Neurological: Positive for tremors and weakness.  Psychiatric/Behavioral: Positive for decreased concentration.   Past Medical History  Diagnosis Date  . Hyperlipidemia   . ETOH abuse   . Allergy   . Diverticulosis   . Hypertension   . Depression   . Arthritis   . Asthma   . GERD (gastroesophageal reflux disease)   . Normal 24 hour ambulatory pH monitoring study     good acid suppression    History   Social History  . Marital Status: Married    Spouse Name: N/A    Number of Children: N/A  . Years of Education: N/A   Occupational History  . Not on file.   Social History Main Topics  . Smoking status: Never Smoker   .  Smokeless tobacco: Not on file  . Alcohol Use: Yes  . Drug Use: No  . Sexually Active: Yes   Other Topics Concern  . Not on file   Social History Narrative  . No narrative on file    Past Surgical History  Procedure Date  . Dobutamine stress echo 07/09/2001    normal  . Esophagogastroduodenoscopy 06/11/2006    normal  . Rotator cuff repair   . Abdominal hysterectomy   . Total hip arthroplasty     left 1991/right 2008  . Nasal sinus surgery 1990    No family history on file.  Allergies  Allergen Reactions  . Latex   . Penicillins     Current Outpatient Prescriptions on File Prior to Visit  Medication Sig Dispense Refill  . ALPRAZolam (XANAX) 0.5 MG tablet Take 1 tablet (0.5 mg total) by mouth 3 (three) times daily as needed.  180 tablet  1  . ARIPiprazole (ABILIFY) 2 MG tablet Take 1 tablet (2 mg total) by mouth daily.  30 tablet  5  . ARIPiprazole (ABILIFY) 2 MG tablet Take 1 tablet (2 mg total) by mouth daily.  90 tablet  1  . ascorbic acid (VITAMIN C) 500 MG tablet Take 500 mg by mouth daily.        Marland Kitchen atorvastatin (LIPITOR) 10 MG tablet TAKE 1 TABLET EVERY DAY  90 tablet  3  . B Complex-C (SUPER B COMPLEX PO) Take  by mouth daily.        Marland Kitchen BENICAR HCT 40-12.5 MG per tablet TAKE 1 TABLET BY MOUTH DAILY  90 tablet  1  . budesonide-formoterol (SYMBICORT) 160-4.5 MCG/ACT inhaler Inhale 2 puffs into the lungs 2 (two) times daily.  1 Inhaler  12  . CELEBREX 200 MG capsule TAKE ONE CAPSULE EVERY DAY  90 capsule  1  . citalopram (CELEXA) 20 MG tablet TAKE 1 TABLET BY MOUTH EVERY DAY  30 tablet  6  . donepezil (ARICEPT) 10 MG tablet Take 1 tablet (10 mg total) by mouth at bedtime.  30 tablet  2  . doxepin (SINEQUAN) 25 MG capsule Take 1 capsule (25 mg total) by mouth at bedtime.  30 capsule  0  . fish oil-omega-3 fatty acids 1000 MG capsule Take 2 g by mouth daily.        . fluticasone (FLONASE) 50 MCG/ACT nasal spray USE ONCE DAILY  48 g  11  . HYDROcodone-acetaminophen  (NORCO) 7.5-325 MG per tablet Take 1 tablet by mouth every 8 (eight) hours as needed (must last 1 month).  60 tablet  0  . pantoprazole (PROTONIX) 40 MG tablet TAKE 1 TABLET DAILY  90 tablet  3  . PREMARIN 0.625 MG tablet TAKE 1 TABLET BY MOUTH DAILY. TAKE DAILY FOR 21 DAYS THEN DO NOT TAKE FOR 7 DAYS.  90 tablet  1  . promethazine (PHENERGAN) 25 MG tablet TAKE 1 TABLET BY MOUTH EVERY 6 HOURS AS NEEDED FOR NAUSEA  20 tablet  0  . DISCONTD: ABILIFY 2 MG tablet TAKE 1 TABLET BY MOUTH EVERY DAY  90 tablet  0   Current Facility-Administered Medications on File Prior to Visit  Medication Dose Route Frequency Provider Last Rate Last Dose  . methylPREDNISolone acetate (DEPO-MEDROL) injection 40 mg  40 mg Intra-articular Once Stacie Glaze, MD        BP 134/80  Pulse 72  Temp 98.6 F (37 C)  Resp 16  Ht 5\' 7"  (1.702 m)  Wt 208 lb (94.348 kg)  BMI 32.58 kg/m2       Objective:   Physical Exam  Nursing note and vitals reviewed. Constitutional: She is oriented to person, place, and time.       Moderately obese white female in no apparent distress  HENT:  Head: Normocephalic and atraumatic.  Eyes: Conjunctivae normal are normal. Pupils are equal, round, and reactive to light.  Neck: Neck supple. No tracheal deviation present. No thyromegaly present.  Cardiovascular: Normal rate and regular rhythm.   Murmur heard. Pulmonary/Chest: Effort normal and breath sounds normal.  Abdominal: Bowel sounds are normal.  Musculoskeletal: She exhibits tenderness.       Examination of her shoulder shows cogwheeling on movement mild joint mice noted Range of motion Limited to pain  Neurological: She is alert and oriented to person, place, and time.          Assessment & Plan:   Informed consent obtained and the patient's left shoulder was prepped with betadine. Local anesthesia was obtained with topical spray. Then 40 mg of Depo-Medrol and 1/2 cc of lidocaine was injected into the joint space. The  patient tolerated the procedure without complications. Post injection care discussed with patient.  We discussed a referral to our movement disorder specialist because of the increasing cogwheeling and rigidity  She also has mild dementia and that may be related to the syndrome We will obtain records from Dr. Anne Hahn.  Her blood pressure stable  today Refill for Abilify She has limited refill on her pain medication

## 2012-07-26 NOTE — Patient Instructions (Signed)
Referred to Dr Tat  The patient is instructed to continue all medications as prescribed. Schedule followup with check out clerk upon leaving the clinic

## 2012-07-30 DIAGNOSIS — J309 Allergic rhinitis, unspecified: Secondary | ICD-10-CM | POA: Diagnosis not present

## 2012-08-01 ENCOUNTER — Other Ambulatory Visit: Payer: Self-pay | Admitting: Internal Medicine

## 2012-08-06 DIAGNOSIS — J309 Allergic rhinitis, unspecified: Secondary | ICD-10-CM | POA: Diagnosis not present

## 2012-08-09 ENCOUNTER — Telehealth: Payer: Self-pay | Admitting: Internal Medicine

## 2012-08-09 NOTE — Telephone Encounter (Signed)
Per dr Lovell Sheehan- try magnesium otc.  Left message on machine For pt

## 2012-08-09 NOTE — Telephone Encounter (Signed)
Caller: Inis/Patient; Patient Name: Aimee Jones; PCP: Darryll Capers (Adults only); Best Callback Phone Number: [phone].  Call regarding Feet Spasms, onset 3 months.  Spasms will wake Pt up during the night.  Pt denies swelling, numbness or burning sensation.  Spasms only happen during the night w/ slight pain.  Pt wanting to know if Dr Lovell Sheehan will call in RX to [facility], [address] [place], [phone].  All emergent symptoms ruled out per Foot non-injury, see in 24 hrs due to new onset of mild pain that has not improved w/ 24 hrs of home care.  Pt was seen on 07-26-12.  Please reveiw w/ MD and follow up w/ Pt if RX will be called in or if MD wants to have Pt come in again.

## 2012-08-13 DIAGNOSIS — J309 Allergic rhinitis, unspecified: Secondary | ICD-10-CM | POA: Diagnosis not present

## 2012-08-15 ENCOUNTER — Encounter: Payer: Self-pay | Admitting: Internal Medicine

## 2012-08-20 DIAGNOSIS — J309 Allergic rhinitis, unspecified: Secondary | ICD-10-CM | POA: Diagnosis not present

## 2012-08-22 ENCOUNTER — Ambulatory Visit (INDEPENDENT_AMBULATORY_CARE_PROVIDER_SITE_OTHER): Payer: Medicare Other | Admitting: Neurology

## 2012-08-22 ENCOUNTER — Encounter: Payer: Self-pay | Admitting: Neurology

## 2012-08-22 VITALS — BP 106/60 | HR 84 | Temp 98.1°F | Resp 16 | Ht 66.75 in | Wt 211.0 lb

## 2012-08-22 DIAGNOSIS — G20A1 Parkinson's disease without dyskinesia, without mention of fluctuations: Secondary | ICD-10-CM | POA: Diagnosis not present

## 2012-08-22 DIAGNOSIS — G20 Parkinson's disease: Secondary | ICD-10-CM

## 2012-08-22 DIAGNOSIS — R413 Other amnesia: Secondary | ICD-10-CM | POA: Diagnosis not present

## 2012-08-22 LAB — CBC WITH DIFFERENTIAL/PLATELET
Basophils Absolute: 0.1 10*3/uL (ref 0.0–0.1)
Eosinophils Relative: 2.5 % (ref 0.0–5.0)
Monocytes Absolute: 0.6 10*3/uL (ref 0.1–1.0)
Monocytes Relative: 7.1 % (ref 3.0–12.0)
Neutrophils Relative %: 62.1 % (ref 43.0–77.0)
Platelets: 361 10*3/uL (ref 150.0–400.0)
WBC: 8.5 10*3/uL (ref 4.5–10.5)

## 2012-08-22 LAB — BASIC METABOLIC PANEL
CO2: 25 mEq/L (ref 19–32)
Calcium: 8.8 mg/dL (ref 8.4–10.5)
Chloride: 101 mEq/L (ref 96–112)
Sodium: 136 mEq/L (ref 135–145)

## 2012-08-22 LAB — VITAMIN B12: Vitamin B-12: 592 pg/mL (ref 211–911)

## 2012-08-22 NOTE — Progress Notes (Signed)
Aimee Jones was seen today in the movement disorders clinic for neurologic consultation at the request of Carrie Mew, MD.  The consultation is for the evaluation of Parkinsonism.  She has seen Dr. Anne Hahn of [facility].  This patient is accompanied in the office by her spouse who supplements the history.  The patient reports that she first noted some tremor in October at her PE when her L arm was lifted above the head.  She denies tremor otherwise but her husband noted tremor for a year when she went to pick up coffee.  She has been on abilify for depression for a few years.  She has not been on other atypical agents.   Specific Symptoms:  Tremor: yes, as above Voice: no changes Sleep: sleeps well, but uses doxepin for sleep.  Multiple awakenings in night because her husband snores.  Vivid Dreams:  no  Acting out dreams:  yes...occasionally yells out in sleep Wet Pillows: no Postural symptoms:  no per pt...but daughter who is a Charity fundraiser noted a few years she walked like a "Parkinsons" patient.  Walks faster than in the past.  Falls?  yes...one time about 6 months ago, got out of chair after awakening in chair and fell Bradykinesia symptoms:no Loss of smell:  no Loss of taste:  no Urinary Incontinence:  no Difficulty Swallowing:  no Handwriting, micrographia: no Trouble with ADL's:  no  Trouble buttoning clothing: no Depression:  yes...but well controlled.  Spends lots of money per husband and no money managing skills. Memory changes:  yes..in 2010 husband noted inability to follow maps with travelling.  No problems with cooking per pt/husband Hallucinations:  no  visual distortions: no N/V:  yes...rarely takes phenergan q 6 weeks Lightheaded:  no  Syncope: no Diplopia:  no  Neuroimaging has not previously been performed.  I  PREVIOUS MEDICATIONS: none to date CURRENT MEDICATION THAT MAY EXACERBATE SX'S:  Abilify  ALLERGIES:   Allergies  Allergen Reactions  . Latex   .  Penicillins     CURRENT MEDICATIONS:  Current Outpatient Prescriptions on File Prior to Visit  Medication Sig Dispense Refill  . ALPRAZolam (XANAX) 0.5 MG tablet Take 1 tablet (0.5 mg total) by mouth 3 (three) times daily as needed.  180 tablet  1  . ARIPiprazole (ABILIFY) 2 MG tablet Take 1 tablet (2 mg total) by mouth daily.  90 tablet  1  . ascorbic acid (VITAMIN C) 500 MG tablet Take 500 mg by mouth daily.        Marland Kitchen atorvastatin (LIPITOR) 10 MG tablet TAKE 1 TABLET EVERY DAY  90 tablet  3  . B Complex-C (SUPER B COMPLEX PO) Take by mouth daily.        Marland Kitchen BENICAR HCT 40-12.5 MG per tablet TAKE 1 TABLET BY MOUTH DAILY  90 tablet  1  . CELEBREX 200 MG capsule TAKE ONE CAPSULE EVERY DAY  90 capsule  1  . citalopram (CELEXA) 20 MG tablet TAKE 1 TABLET BY MOUTH EVERY DAY  30 tablet  6  . donepezil (ARICEPT) 10 MG tablet Take 1 tablet (10 mg total) by mouth at bedtime.  30 tablet  2  . doxepin (SINEQUAN) 25 MG capsule Take 1 capsule (25 mg total) by mouth at bedtime.  30 capsule  0  . fish oil-omega-3 fatty acids 1000 MG capsule Take 2 g by mouth daily.        . fluticasone (FLONASE) 50 MCG/ACT nasal spray USE ONCE DAILY  48  g  11  . HYDROcodone-acetaminophen (NORCO) 7.5-325 MG per tablet Take 1 tablet by mouth every 8 (eight) hours as needed (must last 1 month).  60 tablet  0  . pantoprazole (PROTONIX) 40 MG tablet TAKE 1 TABLET DAILY  90 tablet  3  . PREMARIN 0.625 MG tablet TAKE 1 TABLET BY MOUTH DAILY. TAKE DAILY FOR 21 DAYS THEN DO NOT TAKE FOR 7 DAYS.  90 tablet  1  . promethazine (PHENERGAN) 25 MG tablet TAKE 1 TABLET BY MOUTH EVERY 6 HOURS AS NEEDED FOR NAUSEA  20 tablet  0  . SYMBICORT 160-4.5 MCG/ACT inhaler INHALE 2 PUFFS INTO THE LUNG 2 TIMES A DAY  10.2 g  6  . ARIPiprazole (ABILIFY) 2 MG tablet Take 1 tablet (2 mg total) by mouth daily.  30 tablet  5  . ARIPiprazole (ABILIFY) 2 MG tablet Take 1 tablet (2 mg total) by mouth daily.  90 tablet  1  . budesonide-formoterol (SYMBICORT)  160-4.5 MCG/ACT inhaler Inhale 2 puffs into the lungs 2 (two) times daily.  1 Inhaler  12   Current Facility-Administered Medications on File Prior to Visit  Medication Dose Route Frequency Provider Last Rate Last Dose  . methylPREDNISolone acetate (DEPO-MEDROL) injection 40 mg  40 mg Intra-articular Once Stacie Glaze, MD        PAST MEDICAL HISTORY:   Past Medical History  Diagnosis Date  . Hyperlipidemia   . ETOH abuse   . Allergy   . Diverticulosis   . Hypertension   . Depression   . Arthritis   . Asthma   . GERD (gastroesophageal reflux disease)   . Normal 24 hour ambulatory pH monitoring study     good acid suppression    PAST SURGICAL HISTORY:   Past Surgical History  Procedure Date  . Dobutamine stress echo 07/09/2001    normal  . Esophagogastroduodenoscopy 06/11/2006    normal  . Rotator cuff repair   . Abdominal hysterectomy   . Total hip arthroplasty     left 1991/right 2008  . Nasal sinus surgery 1990    SOCIAL HISTORY:   History   Social History  . Marital Status: Married    Spouse Name: N/A    Number of Children: N/A  . Years of Education: N/A   Occupational History  . Not on file.   Social History Main Topics  . Smoking status: Never Smoker   . Smokeless tobacco: Never Used  . Alcohol Use: Yes     Comment: daily--vodka x 2  . Drug Use: No  . Sexually Active: Yes   Other Topics Concern  . Not on file   Social History Narrative  . No narrative on file    FAMILY HISTORY:   No family status information on file.    ROS:  A complete 10 system review of systems was obtained and was unremarkable apart from what is mentioned above.  PHYSICAL EXAMINATION:    VITALS:   Filed Vitals:   08/22/12 1343  BP: 106/60  Pulse: 84  Temp: 98.1 F (36.7 C)  Resp: 16  Height: 5' 6.75" (1.695 m)  Weight: 211 lb (95.709 kg)    GEN:  The patient appears stated age and is in NAD. HEENT:  Normocephalic, atraumatic.  The mucous membranes are moist.  The superficial temporal arteries are without ropiness or tenderness. CV:  RRR Lungs:  CTAB Neck/HEME:  There are no carotid bruits bilaterally.  Neurological examination:  Orientation: The patient is  alert and oriented x3. Fund of knowledge is appropriate.  Recent and remote memory are intact.  Attention and concentration are normal.    Able to name objects and repeat phrases. Cranial nerves: There is good facial symmetry. Pupils are equal round and reactive to light bilaterally. Fundoscopic exam reveals clear margins bilaterally. Extraocular muscles are intact. The visual fields are full to confrontational testing. The speech is fluent and clear. Soft palate rises symmetrically and there is no tongue deviation. Hearing is intact to conversational tone. Sensation: Sensation is intact to light and pinprick throughout (facial, trunk, extremities). Vibration is intact at the bilateral big toe. There is no extinction with double simultaneous stimulation. There is no sensory dermatomal level identified. Motor: Strength is 5/5 in the bilateral upper and lower extremities.   Shoulder shrug is equal and symmetric.  There is no pronator drift. Deep tendon reflexes: Deep tendon reflexes are 2/4 at the bilateral biceps, triceps, brachioradialis, patella and achilles. Plantar responses are downgoing bilaterally.  Movement examination: Tone: There is normal tone in the bilateral upper extremities.  The tone in the lower extremities is normal.  Abnormal movements: There was no tremor noted.  No dyskinesia. Coordination:  There is mild decremation with RAM's, primarily on the left.  This was noted with finger taps, hand opening and closing, heel taps and toe taps on the left.  There was no dysdiadochokinesia on the right. Gait and Station: The patient has no difficulty arising out of a deep-seated chair without the use of the hands. The patient's stride length is normal, but she does have decreased arm swing on  the left.  She holds the left arm in a flexed posture when she walks..  The patient has a negative pull test.     LABS:  Lab Results  Component Value Date   VITAMINB12 >1500 pg/mL* 06/22/2010   Lab Results  Component Value Date   FOLATE >20.0 ng/mL 06/22/2010   No results found for this basename: RPR     Chemistry      Component Value Date/Time   NA 130* 06/22/2010 1509   K 5.2* 06/22/2010 1509   CL 93* 06/22/2010 1509   CO2 28 06/22/2010 1509   BUN 24* 06/22/2010 1509   CREATININE 1.1 06/22/2010 1509      Component Value Date/Time   CALCIUM 9.8 06/22/2010 1509   ALKPHOS 66 11/03/2010 1415   AST 28 11/03/2010 1415   ALT 23 11/03/2010 1415   BILITOT 0.4 11/03/2010 1415     Lab Results  Component Value Date   WBC 10.6* 11/03/2010   HGB 13.0 11/03/2010   HCT 39.8 11/03/2010   MCV 87.3 11/03/2010   PLT 560.0* 11/03/2010     ASSESSMENT:   1.  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  I do not think that she has idiopathic Parkinson's disease. 2.  mild cognitive impairment.  I see no evidence of a neurodegenerative process.   PLAN:  1.  I talked to the patient about the diagnosis of parkinsonism secondary to antipsychotic medication.  We discussed the diagnosis as well as pathophysiology of the disease.  We discussed treatment options as well as prognostic indicators.  Patient education was provided.  If she is stable and Dr. Lovell Sheehan thinks it is appropriate, it would be my recommendation to change the Abilify to Seroquel (quetiapine).  If she is unable to change to Seroquel because benefits outweigh risks, then I told her we wouldn't really never know if parkinsonism is from  Abilify, and potentially symptoms could worsen.  I also told her that it could take up to 6 months after the discontinuation of Abilify to see a benefit in terms of parkinsonism. 2.  I am concerned about the amount of alcohol that the patient takes in..  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  Of course, parkinsonism can cause some off balance as well.  I also think that we would see an improvement in memory if alcohol was tapered or discontinued altogether.  She and I talked about this at length today.  She probably should not stop "cold [place]" because of risk for seizure/DT's but I think that she could safely try to decrease the alcohol slowly. 3.  right now, I would not discontinue her Aricept since she is having no problems with it.  I am going to do a Mo[facility] next visit. 4.  if she is stable, then I plan on seeing her back in 6 months.  I plan on seeing her back sooner if new neurologic issues arise. 5.  Time in room with pt was 80 min.

## 2012-08-22 NOTE — Patient Instructions (Addendum)
1.  Return in 6 months 2.  Decrease or stop alcohol all together 3.  Talk with Dr. Lovell Sheehan about your Abilify 4. Your MRI is scheduled for Monday, December 2nd at 11:00am.  Please arrive to [facility], first floor admitting by 10:45am [phone].

## 2012-08-28 ENCOUNTER — Other Ambulatory Visit: Payer: Self-pay | Admitting: Internal Medicine

## 2012-09-16 ENCOUNTER — Ambulatory Visit (HOSPITAL_COMMUNITY)
Admission: RE | Admit: 2012-09-16 | Discharge: 2012-09-16 | Disposition: A | Discharge status: Home / Self Care | Payer: Medicare Other | Source: Ambulatory Visit | Attending: Endocrinology | Admitting: Endocrinology

## 2012-09-16 DIAGNOSIS — R7309 Other abnormal glucose: Secondary | ICD-10-CM | POA: Insufficient documentation

## 2012-09-16 DIAGNOSIS — R413 Other amnesia: Secondary | ICD-10-CM | POA: Insufficient documentation

## 2012-09-16 DIAGNOSIS — F1010 Alcohol abuse, uncomplicated: Secondary | ICD-10-CM | POA: Insufficient documentation

## 2012-09-16 DIAGNOSIS — G20A1 Parkinson's disease without dyskinesia, without mention of fluctuations: Secondary | ICD-10-CM | POA: Insufficient documentation

## 2012-09-16 DIAGNOSIS — G20 Parkinson's disease: Secondary | ICD-10-CM | POA: Diagnosis not present

## 2012-09-16 DIAGNOSIS — I10 Essential (primary) hypertension: Secondary | ICD-10-CM | POA: Insufficient documentation

## 2012-09-17 DIAGNOSIS — J309 Allergic rhinitis, unspecified: Secondary | ICD-10-CM | POA: Diagnosis not present

## 2012-09-23 ENCOUNTER — Other Ambulatory Visit: Payer: Self-pay | Admitting: Internal Medicine

## 2012-09-24 DIAGNOSIS — J309 Allergic rhinitis, unspecified: Secondary | ICD-10-CM | POA: Diagnosis not present

## 2012-09-26 ENCOUNTER — Other Ambulatory Visit: Payer: Self-pay | Admitting: Internal Medicine

## 2012-10-01 DIAGNOSIS — J309 Allergic rhinitis, unspecified: Secondary | ICD-10-CM | POA: Diagnosis not present

## 2012-10-04 ENCOUNTER — Other Ambulatory Visit: Payer: Self-pay | Admitting: Internal Medicine

## 2012-10-15 ENCOUNTER — Other Ambulatory Visit: Payer: Self-pay | Admitting: Internal Medicine

## 2012-10-15 MED ORDER — HYDROCODONE-ACETAMINOPHEN 7.5-325 MG PO TABS: 1.0000 | ORAL_TABLET | Freq: Three times a day (TID) | ORAL | Status: DC | PRN

## 2012-10-15 NOTE — Telephone Encounter (Signed)
Pt need new rx hydrocodone 7.5-325mg  [facility]

## 2012-10-15 NOTE — Telephone Encounter (Signed)
Called to pharmacy 

## 2012-10-25 ENCOUNTER — Encounter: Payer: Self-pay | Admitting: Internal Medicine

## 2012-10-25 ENCOUNTER — Ambulatory Visit (INDEPENDENT_AMBULATORY_CARE_PROVIDER_SITE_OTHER): Payer: Medicare Other | Admitting: Internal Medicine

## 2012-10-25 VITALS — BP 110/70 | HR 72 | Temp 98.3°F | Resp 16 | Ht 66.75 in | Wt 210.0 lb

## 2012-10-25 DIAGNOSIS — IMO0001 Reserved for inherently not codable concepts without codable children: Secondary | ICD-10-CM

## 2012-10-25 DIAGNOSIS — M67919 Unspecified disorder of synovium and tendon, unspecified shoulder: Secondary | ICD-10-CM

## 2012-10-25 DIAGNOSIS — M25519 Pain in unspecified shoulder: Secondary | ICD-10-CM

## 2012-10-25 DIAGNOSIS — M719 Bursopathy, unspecified: Secondary | ICD-10-CM | POA: Diagnosis not present

## 2012-10-25 DIAGNOSIS — M25512 Pain in left shoulder: Secondary | ICD-10-CM

## 2012-10-25 DIAGNOSIS — I10 Essential (primary) hypertension: Secondary | ICD-10-CM

## 2012-10-25 DIAGNOSIS — M7552 Bursitis of left shoulder: Secondary | ICD-10-CM

## 2012-10-25 DIAGNOSIS — M25511 Pain in right shoulder: Secondary | ICD-10-CM | POA: Insufficient documentation

## 2012-10-25 MED ORDER — METHYLPREDNISOLONE ACETATE 40 MG/ML IJ SUSP: 40.0000 mg | Freq: Once | INTRAMUSCULAR | Status: DC

## 2012-10-25 MED ORDER — METHYLPREDNISOLONE (PAK) 4 MG PO TABS: ORAL_TABLET | ORAL | Status: DC

## 2012-10-25 NOTE — Patient Instructions (Signed)
Refer to orthopedics 

## 2012-10-25 NOTE — Progress Notes (Signed)
  Subjective:    Patient ID: Aimee Jones, female    DOB: Oct 26, 1945, 67 y.o.   MRN: [id number]  HPI Patient is a 67 year old white female with a history of prior rotator cuff tear and repair on the right and presents with left rotator cuff and shoulder pain.  She states it's worse at night she cannot rollover on that side. She is also followed for a history of fibromyalgia history of asthma severe depression in the context of a history of mild to moderate dementia and alcohol use   Review of Systems  Constitutional: Negative for activity change, appetite change and fatigue.  HENT: Positive for neck stiffness. Negative for ear pain, congestion, neck pain, postnasal drip and sinus pressure.   Eyes: Negative for redness and visual disturbance.  Respiratory: Negative for cough, shortness of breath and wheezing.   Gastrointestinal: Negative for abdominal pain and abdominal distention.  Genitourinary: Negative for dysuria, frequency and menstrual problem.  Musculoskeletal: Positive for myalgias, back pain, joint swelling and arthralgias.  Skin: Negative for rash and wound.  Neurological: Negative for dizziness, weakness and headaches.  Hematological: Negative for adenopathy. Does not bruise/bleed easily.  Psychiatric/Behavioral: Positive for decreased concentration. Negative for sleep disturbance.       Objective:   Physical Exam  Nursing note and vitals reviewed. Constitutional: She is oriented to person, place, and time. She appears well-developed and well-nourished. No distress.  HENT:  Head: Normocephalic and atraumatic.  Right Ear: External ear normal.  Left Ear: External ear normal.  Nose: Nose normal.  Mouth/Throat: Oropharynx is clear and moist.  Eyes: Conjunctivae normal and EOM are normal. Pupils are equal, round, and reactive to light.  Neck: Normal range of motion. Neck supple. No JVD present. No tracheal deviation present. No thyromegaly present.  Cardiovascular:  Normal rate, regular rhythm, normal heart sounds and intact distal pulses.   No murmur heard. Pulmonary/Chest: Effort normal and breath sounds normal. She has no wheezes. She exhibits no tenderness.  Abdominal: Soft. Bowel sounds are normal.  Musculoskeletal: Normal range of motion. She exhibits edema and tenderness.  Lymphadenopathy:    She has no cervical adenopathy.  Neurological: She is alert and oriented to person, place, and time. She has normal reflexes. No cranial nerve deficit.  Skin: Skin is warm and dry. She is not diaphoretic.  Psychiatric: She has a normal mood and affect. Her behavior is normal.          Assessment & Plan:   Informed consent obtained and the patient's left shoulder was prepped with betadine. Local anesthesia was obtained with topical spray. Then 40 mg of Depo-Medrol and 1/2 cc of lidocaine was injected into the joint space. The patient tolerated the procedure without complications. Post injection care discussed with patient.  Discussed referral to orthopedics for evaluation of her shoulder given the context of previous rotator cuff tear. Medrol Dosepak for inflammation continue the Celebrex  Stable asthma Flair fibromyalgia

## 2012-10-28 ENCOUNTER — Ambulatory Visit: Payer: Medicare Other | Admitting: Internal Medicine

## 2012-10-29 ENCOUNTER — Other Ambulatory Visit: Payer: Self-pay | Admitting: Internal Medicine

## 2012-10-29 ENCOUNTER — Encounter: Payer: Self-pay | Admitting: Internal Medicine

## 2012-10-29 DIAGNOSIS — J309 Allergic rhinitis, unspecified: Secondary | ICD-10-CM | POA: Diagnosis not present

## 2012-10-31 DIAGNOSIS — M25519 Pain in unspecified shoulder: Secondary | ICD-10-CM | POA: Diagnosis not present

## 2012-11-05 DIAGNOSIS — J309 Allergic rhinitis, unspecified: Secondary | ICD-10-CM | POA: Diagnosis not present

## 2012-11-07 ENCOUNTER — Other Ambulatory Visit: Payer: Self-pay | Admitting: Internal Medicine

## 2012-11-07 DIAGNOSIS — M25519 Pain in unspecified shoulder: Secondary | ICD-10-CM | POA: Diagnosis not present

## 2012-11-09 ENCOUNTER — Other Ambulatory Visit: Payer: Self-pay | Admitting: Internal Medicine

## 2012-11-11 NOTE — Telephone Encounter (Signed)
Pt would like refill of HYDROcodone-acetaminophen (NORCO) 7.5-325 MG per tablet. Pt states she is in much pain. Pt is out of meds.  [facility] [address]

## 2012-11-12 ENCOUNTER — Other Ambulatory Visit: Payer: Self-pay | Admitting: *Deleted

## 2012-11-12 DIAGNOSIS — J309 Allergic rhinitis, unspecified: Secondary | ICD-10-CM | POA: Diagnosis not present

## 2012-11-12 MED ORDER — HYDROCODONE-ACETAMINOPHEN 7.5-325 MG PO TABS: 1.0000 | ORAL_TABLET | Freq: Three times a day (TID) | ORAL | Status: DC | PRN

## 2012-11-18 ENCOUNTER — Telehealth: Payer: Self-pay | Admitting: Internal Medicine

## 2012-11-18 NOTE — Telephone Encounter (Signed)
Left message on voicemail. Okay for injection can come this afternoon at available opening.

## 2012-11-18 NOTE — Telephone Encounter (Signed)
[place] (After Hours Triage) Fax: [phone] From: [facility] Date/ Time: 11/15/2012 7:54 PM Taken By: Jethro BolusElinor Dodge Facility: home Patient: Aimee Jones DOB: Oct 05, 1946 Phone: [phone] Reason for Call: Pt requested that I fax a note to the office to let Dr. Lovell Sheehan know that she needs a cortisone injection in her shoulders. She declined after hours triage and just asked that [place] call her back at her earliest convenience.

## 2012-11-18 NOTE — Telephone Encounter (Signed)
May come in this afternoon

## 2012-11-20 ENCOUNTER — Telehealth: Payer: Self-pay | Admitting: *Deleted

## 2012-11-20 NOTE — Telephone Encounter (Signed)
Made appointment for pt and she went to ortho yesterday-

## 2012-11-20 NOTE — Telephone Encounter (Signed)
done

## 2012-11-21 DIAGNOSIS — J309 Allergic rhinitis, unspecified: Secondary | ICD-10-CM | POA: Diagnosis not present

## 2012-11-26 DIAGNOSIS — J309 Allergic rhinitis, unspecified: Secondary | ICD-10-CM | POA: Diagnosis not present

## 2012-11-30 ENCOUNTER — Other Ambulatory Visit: Payer: Self-pay

## 2012-12-02 ENCOUNTER — Encounter: Payer: Self-pay | Admitting: Specialist

## 2012-12-02 ENCOUNTER — Ambulatory Visit: Payer: Medicare Other | Admitting: Internal Medicine

## 2012-12-02 DIAGNOSIS — IMO0001 Reserved for inherently not codable concepts without codable children: Secondary | ICD-10-CM | POA: Diagnosis not present

## 2012-12-02 DIAGNOSIS — M6281 Muscle weakness (generalized): Secondary | ICD-10-CM | POA: Diagnosis not present

## 2012-12-02 DIAGNOSIS — M25619 Stiffness of unspecified shoulder, not elsewhere classified: Secondary | ICD-10-CM | POA: Diagnosis not present

## 2012-12-02 DIAGNOSIS — M25519 Pain in unspecified shoulder: Secondary | ICD-10-CM | POA: Diagnosis not present

## 2012-12-03 ENCOUNTER — Other Ambulatory Visit: Payer: Self-pay | Admitting: Internal Medicine

## 2012-12-03 DIAGNOSIS — J309 Allergic rhinitis, unspecified: Secondary | ICD-10-CM | POA: Diagnosis not present

## 2012-12-10 DIAGNOSIS — M6281 Muscle weakness (generalized): Secondary | ICD-10-CM | POA: Diagnosis not present

## 2012-12-10 DIAGNOSIS — J309 Allergic rhinitis, unspecified: Secondary | ICD-10-CM | POA: Diagnosis not present

## 2012-12-10 DIAGNOSIS — IMO0001 Reserved for inherently not codable concepts without codable children: Secondary | ICD-10-CM | POA: Diagnosis not present

## 2012-12-10 DIAGNOSIS — M25519 Pain in unspecified shoulder: Secondary | ICD-10-CM | POA: Diagnosis not present

## 2012-12-10 DIAGNOSIS — M25619 Stiffness of unspecified shoulder, not elsewhere classified: Secondary | ICD-10-CM | POA: Diagnosis not present

## 2012-12-11 DIAGNOSIS — J309 Allergic rhinitis, unspecified: Secondary | ICD-10-CM | POA: Diagnosis not present

## 2012-12-13 DIAGNOSIS — M6281 Muscle weakness (generalized): Secondary | ICD-10-CM | POA: Diagnosis not present

## 2012-12-13 DIAGNOSIS — IMO0001 Reserved for inherently not codable concepts without codable children: Secondary | ICD-10-CM | POA: Diagnosis not present

## 2012-12-13 DIAGNOSIS — M25519 Pain in unspecified shoulder: Secondary | ICD-10-CM | POA: Diagnosis not present

## 2012-12-13 DIAGNOSIS — M25619 Stiffness of unspecified shoulder, not elsewhere classified: Secondary | ICD-10-CM | POA: Diagnosis not present

## 2012-12-14 ENCOUNTER — Encounter: Payer: Self-pay | Admitting: Specialist

## 2012-12-14 ENCOUNTER — Other Ambulatory Visit: Payer: Self-pay | Admitting: Internal Medicine

## 2012-12-17 DIAGNOSIS — J309 Allergic rhinitis, unspecified: Secondary | ICD-10-CM | POA: Diagnosis not present

## 2012-12-19 DIAGNOSIS — J45909 Unspecified asthma, uncomplicated: Secondary | ICD-10-CM | POA: Diagnosis not present

## 2012-12-19 DIAGNOSIS — H1045 Other chronic allergic conjunctivitis: Secondary | ICD-10-CM | POA: Diagnosis not present

## 2012-12-19 DIAGNOSIS — J3089 Other allergic rhinitis: Secondary | ICD-10-CM | POA: Diagnosis not present

## 2012-12-22 ENCOUNTER — Other Ambulatory Visit: Payer: Self-pay | Admitting: Internal Medicine

## 2012-12-23 ENCOUNTER — Other Ambulatory Visit: Payer: Self-pay | Admitting: Internal Medicine

## 2013-01-02 DIAGNOSIS — J309 Allergic rhinitis, unspecified: Secondary | ICD-10-CM | POA: Diagnosis not present

## 2013-01-15 ENCOUNTER — Other Ambulatory Visit: Payer: Self-pay | Admitting: Internal Medicine

## 2013-01-15 DIAGNOSIS — M75120 Complete rotator cuff tear or rupture of unspecified shoulder, not specified as traumatic: Secondary | ICD-10-CM | POA: Diagnosis not present

## 2013-01-21 DIAGNOSIS — M25519 Pain in unspecified shoulder: Secondary | ICD-10-CM | POA: Diagnosis not present

## 2013-01-22 ENCOUNTER — Other Ambulatory Visit: Payer: Self-pay | Admitting: Neurology

## 2013-01-22 ENCOUNTER — Other Ambulatory Visit: Payer: Self-pay | Admitting: Internal Medicine

## 2013-01-27 DIAGNOSIS — M75120 Complete rotator cuff tear or rupture of unspecified shoulder, not specified as traumatic: Secondary | ICD-10-CM | POA: Diagnosis not present

## 2013-01-27 DIAGNOSIS — M25519 Pain in unspecified shoulder: Secondary | ICD-10-CM | POA: Diagnosis not present

## 2013-01-28 DIAGNOSIS — J309 Allergic rhinitis, unspecified: Secondary | ICD-10-CM | POA: Diagnosis not present

## 2013-02-04 ENCOUNTER — Telehealth: Payer: Self-pay | Admitting: Internal Medicine

## 2013-02-04 DIAGNOSIS — J309 Allergic rhinitis, unspecified: Secondary | ICD-10-CM | POA: Diagnosis not present

## 2013-02-04 NOTE — Telephone Encounter (Signed)
PT called to request a refill of her HYDROcodone-acetaminophen (NORCO) 7.5-325 MG per tablet. Please assist.

## 2013-02-04 NOTE — Telephone Encounter (Signed)
Talked with husband and made him aware of her drinking- he is aware- we will have to wait until dr Lovell Sheehan returns tomorrow to ask about return.

## 2013-02-05 ENCOUNTER — Other Ambulatory Visit: Payer: Self-pay | Admitting: *Deleted

## 2013-02-05 NOTE — Telephone Encounter (Signed)
Per dr Lovell Sheehan- may have #30-use as directed an d sparingly

## 2013-02-07 ENCOUNTER — Telehealth: Payer: Self-pay | Admitting: Internal Medicine

## 2013-02-07 NOTE — Telephone Encounter (Signed)
Per dr Lovell Sheehan- explained why cant have surgery while drinking and she should cut back daily on drinking until she has stopped completely. after she has stopped completely for 2-3weeks, she can make appointment with orth for surgery. Pt informed

## 2013-02-07 NOTE — Telephone Encounter (Signed)
Caller: [facility]/Patient; Phone: [phone] [phone]; Reason for Call: Patient is seeing Orthopedist Dr.  Thomasena Edis for left Shoulder pain.  Planning surgery for "a ligament that will be cut and released".  Out patient surgery that HAS NOT BEEN SCHEDULED.  She spoke with Kendal Hymen today about a referral for the orthopiedist for insurance reasons and her drinking.  She is concerned that she drinks 1 and 1/2 jigger of Vodka and wants to make sure that she will be ok.  She is trying to wean her self down from the alcohol.  Her daughter, who is a Engineer, civil (consulting), told her she needed to be 60 days without a drink .  Prior to surgery.  She would like guidance.

## 2013-02-13 ENCOUNTER — Other Ambulatory Visit: Payer: Self-pay | Admitting: Internal Medicine

## 2013-02-20 ENCOUNTER — Other Ambulatory Visit: Payer: Self-pay | Admitting: Internal Medicine

## 2013-02-21 ENCOUNTER — Encounter: Payer: Self-pay | Admitting: Internal Medicine

## 2013-02-21 ENCOUNTER — Other Ambulatory Visit: Payer: Self-pay | Admitting: *Deleted

## 2013-02-21 ENCOUNTER — Ambulatory Visit (INDEPENDENT_AMBULATORY_CARE_PROVIDER_SITE_OTHER): Payer: Medicare Other | Admitting: Internal Medicine

## 2013-02-21 VITALS — BP 130/80 | HR 76 | Temp 98.2°F | Resp 16 | Ht 66.75 in | Wt 194.0 lb

## 2013-02-21 DIAGNOSIS — I10 Essential (primary) hypertension: Secondary | ICD-10-CM

## 2013-02-21 DIAGNOSIS — M25519 Pain in unspecified shoulder: Secondary | ICD-10-CM

## 2013-02-21 DIAGNOSIS — M25512 Pain in left shoulder: Secondary | ICD-10-CM

## 2013-02-21 DIAGNOSIS — R413 Other amnesia: Secondary | ICD-10-CM

## 2013-02-21 DIAGNOSIS — F1010 Alcohol abuse, uncomplicated: Secondary | ICD-10-CM

## 2013-02-21 MED ORDER — HYDROCODONE-ACETAMINOPHEN 7.5-325 MG PO TABS: 1.0000 | ORAL_TABLET | Freq: Three times a day (TID) | ORAL | Status: DC | PRN

## 2013-02-21 MED ORDER — VITAMIN B-1 100 MG PO TABS: 100.0000 mg | ORAL_TABLET | Freq: Two times a day (BID) | ORAL | Status: DC

## 2013-02-21 MED ORDER — LORAZEPAM 0.5 MG PO TABS: ORAL_TABLET | ORAL | Status: DC

## 2013-02-21 MED ORDER — CYANOCOBALAMIN 500 MCG PO TBDP: 1.0000 | ORAL_TABLET | Freq: Two times a day (BID) | ORAL | Status: DC

## 2013-02-21 MED ORDER — FLUTICASONE PROPIONATE 50 MCG/ACT NA SUSP: NASAL | Status: DC

## 2013-02-21 NOTE — Assessment & Plan Note (Signed)
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

## 2013-02-21 NOTE — Addendum Note (Signed)
Addended by: Willy Eddy on: 02/21/2013 11:07 AM   Modules accepted: Orders

## 2013-02-21 NOTE — Patient Instructions (Signed)
This is a Community education officer we have agreed on today  1.  You may no longer have alcohol in any form beer wine or liquor 2.   You will take thiamine and B12 twice daily 3.   You will use a long-acting benzodiazepine twice a day to control potential withdrawal symptoms. You will need to stay on this medication for at least 2 weeks The medication will gradually be tapered as mentioned on the prescription You may not drive a car during this period

## 2013-02-21 NOTE — Progress Notes (Signed)
Subjective:    Patient ID: Aimee Jones, female    DOB: 02-14-1946, 67 y.o.   MRN: [id number]  Hypertension This is a chronic problem. The current episode started more than 1 year ago. The problem is unchanged. The problem is controlled. Associated symptoms include anxiety, malaise/fatigue and shortness of breath. Pertinent negatives include no blurred vision, chest pain or headaches. Risk factors for coronary artery disease include family history, post-menopausal state, obesity, stress and sedentary lifestyle. Past treatments include angiotensin blockers. The current treatment provides significant improvement. Compliance problems include exercise and psychosocial issues.   Gastrophageal Reflux She complains of belching. She reports no chest pain. This is a recurrent problem. The current episode started more than 1 year ago. The problem occurs occasionally. The symptoms are aggravated by ETOH. Risk factors include ETOH use and lack of exercise. She has tried a PPI for the symptoms. The treatment provided moderate relief.  Alcohol Problem The patient's primary symptoms include agitation, confusion, hallucinations, intoxication and somnolence. This is a chronic problem. The current episode started more than 1 year ago. The problem has been gradually worsening since onset. Suspected agents include alcohol. Associated symptoms include bladder incontinence and an injury. Past treatments include Alcoholics Anonymous. The treatment provided mild relief. Her past medical history is significant for addiction treatment. (Encephalopathy/ memory disorder)      Review of Systems  Constitutional: Positive for malaise/fatigue.  Eyes: Negative for blurred vision.  Respiratory: Positive for shortness of breath.   Cardiovascular: Negative for chest pain.  Genitourinary: Positive for bladder incontinence.  Neurological: Negative for headaches.  Psychiatric/Behavioral: Positive for hallucinations, confusion  and agitation.   Past Medical History  Diagnosis Date  . Hyperlipidemia   . ETOH abuse   . Allergy   . Diverticulosis   . Hypertension   . Depression   . Arthritis   . Asthma   . GERD (gastroesophageal reflux disease)   . Normal 24 hour ambulatory pH monitoring study     good acid suppression    History   Social History  . Marital Status: Married    Spouse Name: N/A    Number of Children: N/A  . Years of Education: N/A   Occupational History  . retired     [employer]   Social History Main Topics  . Smoking status: Never Smoker   . Smokeless tobacco: Never Used  . Alcohol Use: Yes     Comment: daily--vodka and coke - 2-3 drinks  . Drug Use: No  . Sexually Active: Yes   Other Topics Concern  . Not on file   Social History Narrative  . No narrative on file    Past Surgical History  Procedure Laterality Date  . Dobutamine stress echo  07/09/2001    normal  . Esophagogastroduodenoscopy  06/11/2006    normal  . Rotator cuff repair      right  . Abdominal hysterectomy    . Total hip arthroplasty      left 1991/right 2008  . Nasal sinus surgery  1990    No family history on file.  Allergies  Allergen Reactions  . Latex   . Penicillins     Current Outpatient Prescriptions on File Prior to Visit  Medication Sig Dispense Refill  . ALPRAZolam (XANAX) 0.5 MG tablet TAKE 1 TABLET BY MOUTH 3 TIMES DAILY  90 tablet  1  . ARIPiprazole (ABILIFY) 2 MG tablet Take 2 mg by mouth daily.      Marland Kitchen ascorbic acid (  VITAMIN C) 500 MG tablet Take 500 mg by mouth daily.        Marland Kitchen atorvastatin (LIPITOR) 10 MG tablet TAKE 1 TABLET EVERY DAY  90 tablet  3  . B Complex-C (SUPER B COMPLEX PO) Take by mouth daily.        Marland Kitchen BENICAR HCT 40-12.5 MG per tablet TAKE 1 TABLET BY MOUTH DAILY  90 tablet  1  . BENICAR HCT 40-12.5 MG per tablet TAKE 1 TABLET BY MOUTH DAILY  90 tablet  1  . budesonide-formoterol (SYMBICORT) 160-4.5 MCG/ACT inhaler Inhale 2 puffs into the lungs 2 (two) times  daily.      . CELEBREX 200 MG capsule TAKE ONE CAPSULE BY MOUTH EVERY DAY  90 capsule  1  . citalopram (CELEXA) 20 MG tablet TAKE 1 TABLET BY MOUTH EVERY DAY  30 tablet  2  . donepezil (ARICEPT) 10 MG tablet TAKE 1 TABLET BY MOUTH DAILY AT BEDTIME  90 tablet  0  . doxepin (SINEQUAN) 25 MG capsule TAKE 1 CAPSULE BY MOUTH DAILY AT BEDTIME  30 capsule  3  . fish oil-omega-3 fatty acids 1000 MG capsule Take 2 g by mouth daily.        Marland Kitchen HYDROcodone-acetaminophen (NORCO) 7.5-325 MG per tablet Take 1 tablet by mouth every 8 (eight) hours as needed for pain.  60 tablet  0  . methylPREDNIsolone (MEDROL DOSPACK) 4 MG tablet follow package directions  21 tablet  0  . pantoprazole (PROTONIX) 40 MG tablet TAKE 1 TABLET DAILY  90 tablet  3  . pantoprazole (PROTONIX) 40 MG tablet TAKE 1 TABLET EVERY DAY  90 tablet  0  . PREMARIN 0.625 MG tablet TAKE 1 TABLET BY MOUTH DAILY. TAKE DAILY FOR 21 DAYS THEN DO NOT TAKE FOR 7 DAYS.  90 tablet  1  . PREMARIN 0.625 MG tablet TAKE 1 TABLET BY MOUTH DAILY FOR 21 DAYS THEN DO NOT TAKE FOR 7 DAYS.  90 tablet  1  . PREMARIN 0.625 MG tablet TAKE 1 TABLET BY MOUTH DAILY FOR 21 DAYS THEN DO NOT TAKE FOR 7 DAYS.  90 tablet  1  . promethazine (PHENERGAN) 25 MG tablet TAKE 1 TABLET BY MOUTH EVERY 6 HOURS AS NEEDED FOR NAUSEA  20 tablet  0  . SYMBICORT 160-4.5 MCG/ACT inhaler INHALE 2 PUFFS INTO THE LUNG 2 TIMES A DAY  10.2 g  6   Current Facility-Administered Medications on File Prior to Visit  Medication Dose Route Frequency Provider Last Rate Last Dose  . methylPREDNISolone acetate (DEPO-MEDROL) injection 40 mg  40 mg Intra-articular Once Stacie Glaze, MD        BP 130/80  Pulse 76  Temp(Src) 98.2 F (36.8 C)  Resp 16  Ht 5' 6.75" (1.695 m)  Wt 194 lb (87.998 kg)  BMI 30.63 kg/m2        Objective:   Physical Exam  Nursing note and vitals reviewed. Constitutional: She appears well-nourished.  HENT:  Head: Normocephalic and atraumatic.  Eyes: Conjunctivae are  normal. Pupils are equal, round, and reactive to light.  Neck: Normal range of motion. Neck supple. No JVD present.  Cardiovascular: Regular rhythm.   Murmur heard. Pulmonary/Chest: Effort normal and breath sounds normal. No stridor.  Psychiatric:  Difficulty following complex steps in the conversation and difficulty recalling objects upon immediate recall Her judgment demonstrated is poor          Assessment & Plan:

## 2013-02-21 NOTE — Assessment & Plan Note (Signed)
Complex alcohol abuse history with some enabling by her husband contract obtained today to cease enabling and begin withdrawal protocol so that she can dissipate and orthopedic surgery for shoulder pain

## 2013-02-21 NOTE — Assessment & Plan Note (Signed)
Stable hypertension on current medications she is on an ARB class drugs and tolerating it well a basic metabolic panel will be drawn today to look for renal insufficiency and potassium levels before initiating an alcohol withdrawal protocol

## 2013-02-21 NOTE — Addendum Note (Signed)
Addended by: Willy Eddy on: 02/21/2013 11:08 AM   Modules accepted: Orders

## 2013-02-21 NOTE — Assessment & Plan Note (Signed)
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 Vitamins and thiamine

## 2013-02-23 ENCOUNTER — Other Ambulatory Visit: Payer: Self-pay | Admitting: Internal Medicine

## 2013-02-25 ENCOUNTER — Telehealth: Payer: Self-pay | Admitting: Internal Medicine

## 2013-02-25 NOTE — Telephone Encounter (Signed)
Please call [facility] about your message on their machine re this pt's Xanax. Thanks.

## 2013-02-27 ENCOUNTER — Other Ambulatory Visit: Payer: Self-pay | Admitting: *Deleted

## 2013-02-27 NOTE — Telephone Encounter (Signed)
Per dr Bary Castilla take xanax with ativan- pt's husband informed

## 2013-03-04 ENCOUNTER — Ambulatory Visit: Payer: Medicare Other | Admitting: Family Medicine

## 2013-03-04 DIAGNOSIS — J309 Allergic rhinitis, unspecified: Secondary | ICD-10-CM | POA: Diagnosis not present

## 2013-03-07 ENCOUNTER — Encounter: Payer: Self-pay | Admitting: Internal Medicine

## 2013-03-09 ENCOUNTER — Other Ambulatory Visit: Payer: Self-pay | Admitting: Internal Medicine

## 2013-03-11 ENCOUNTER — Encounter: Payer: Self-pay | Admitting: Internal Medicine

## 2013-03-12 ENCOUNTER — Encounter: Payer: Self-pay | Admitting: Internal Medicine

## 2013-03-12 ENCOUNTER — Other Ambulatory Visit: Payer: Self-pay | Admitting: *Deleted

## 2013-03-12 DIAGNOSIS — R413 Other amnesia: Secondary | ICD-10-CM

## 2013-03-12 DIAGNOSIS — F1010 Alcohol abuse, uncomplicated: Secondary | ICD-10-CM

## 2013-03-12 MED ORDER — HYDROCODONE-ACETAMINOPHEN 7.5-325 MG PO TABS: 1.0000 | ORAL_TABLET | Freq: Three times a day (TID) | ORAL | Status: DC | PRN

## 2013-03-12 NOTE — Telephone Encounter (Signed)
Sorry,but dr Lovell Sheehan said she can not have the ativan(he gave her at last ov) visit and xanax. They are the same type of med and you cant take together.   Also she receiver #60 hydrocodone on may 7 ,so it is too early for refill per dr Lovell Sheehan

## 2013-03-13 ENCOUNTER — Encounter: Payer: Self-pay | Admitting: Internal Medicine

## 2013-03-13 ENCOUNTER — Telehealth: Payer: Self-pay | Admitting: Internal Medicine

## 2013-03-13 NOTE — Telephone Encounter (Signed)
Husband/William calling to see if office has received form from [facility] asking MD to clear patient for surgery. Advised from chart, office has not received but Dr. Lovell Sheehan will be in 5-30 to complete when form received.

## 2013-03-14 ENCOUNTER — Encounter: Payer: Self-pay | Admitting: Internal Medicine

## 2013-03-24 DIAGNOSIS — M75120 Complete rotator cuff tear or rupture of unspecified shoulder, not specified as traumatic: Secondary | ICD-10-CM | POA: Diagnosis not present

## 2013-03-25 DIAGNOSIS — M67919 Unspecified disorder of synovium and tendon, unspecified shoulder: Secondary | ICD-10-CM | POA: Diagnosis not present

## 2013-03-25 DIAGNOSIS — M719 Bursopathy, unspecified: Secondary | ICD-10-CM | POA: Diagnosis not present

## 2013-03-25 DIAGNOSIS — M19019 Primary osteoarthritis, unspecified shoulder: Secondary | ICD-10-CM | POA: Diagnosis not present

## 2013-03-27 DIAGNOSIS — J309 Allergic rhinitis, unspecified: Secondary | ICD-10-CM | POA: Diagnosis not present

## 2013-03-28 ENCOUNTER — Other Ambulatory Visit: Payer: Self-pay | Admitting: *Deleted

## 2013-03-28 ENCOUNTER — Encounter: Payer: Self-pay | Admitting: Internal Medicine

## 2013-03-28 MED ORDER — CITALOPRAM HYDROBROMIDE 40 MG PO TABS: 40.0000 mg | ORAL_TABLET | Freq: Every day | ORAL | Status: DC

## 2013-03-28 NOTE — Telephone Encounter (Signed)
ok 

## 2013-04-01 ENCOUNTER — Encounter: Payer: Self-pay | Admitting: Specialist

## 2013-04-01 DIAGNOSIS — M6281 Muscle weakness (generalized): Secondary | ICD-10-CM | POA: Diagnosis not present

## 2013-04-01 DIAGNOSIS — M25519 Pain in unspecified shoulder: Secondary | ICD-10-CM | POA: Diagnosis not present

## 2013-04-01 DIAGNOSIS — J309 Allergic rhinitis, unspecified: Secondary | ICD-10-CM | POA: Diagnosis not present

## 2013-04-01 DIAGNOSIS — IMO0001 Reserved for inherently not codable concepts without codable children: Secondary | ICD-10-CM | POA: Diagnosis not present

## 2013-04-01 DIAGNOSIS — M25619 Stiffness of unspecified shoulder, not elsewhere classified: Secondary | ICD-10-CM | POA: Diagnosis not present

## 2013-04-02 DIAGNOSIS — Z4789 Encounter for other orthopedic aftercare: Secondary | ICD-10-CM | POA: Diagnosis not present

## 2013-04-09 ENCOUNTER — Other Ambulatory Visit: Payer: Self-pay | Admitting: Internal Medicine

## 2013-04-10 ENCOUNTER — Other Ambulatory Visit: Payer: Self-pay | Admitting: Neurology

## 2013-04-10 DIAGNOSIS — M25519 Pain in unspecified shoulder: Secondary | ICD-10-CM | POA: Diagnosis not present

## 2013-04-10 DIAGNOSIS — J309 Allergic rhinitis, unspecified: Secondary | ICD-10-CM | POA: Diagnosis not present

## 2013-04-10 DIAGNOSIS — M6281 Muscle weakness (generalized): Secondary | ICD-10-CM | POA: Diagnosis not present

## 2013-04-10 DIAGNOSIS — IMO0001 Reserved for inherently not codable concepts without codable children: Secondary | ICD-10-CM | POA: Diagnosis not present

## 2013-04-10 DIAGNOSIS — M25619 Stiffness of unspecified shoulder, not elsewhere classified: Secondary | ICD-10-CM | POA: Diagnosis not present

## 2013-04-11 ENCOUNTER — Other Ambulatory Visit: Payer: Self-pay | Admitting: Internal Medicine

## 2013-04-14 ENCOUNTER — Other Ambulatory Visit: Payer: Self-pay | Admitting: Internal Medicine

## 2013-04-15 ENCOUNTER — Encounter: Payer: Self-pay | Admitting: Neurology

## 2013-04-15 ENCOUNTER — Encounter: Payer: Self-pay | Admitting: Specialist

## 2013-04-15 ENCOUNTER — Ambulatory Visit (INDEPENDENT_AMBULATORY_CARE_PROVIDER_SITE_OTHER): Payer: Medicare Other | Admitting: Neurology

## 2013-04-15 VITALS — BP 132/82 | HR 80 | Temp 98.5°F | Resp 18 | Wt 196.0 lb

## 2013-04-15 DIAGNOSIS — Z9889 Other specified postprocedural states: Secondary | ICD-10-CM | POA: Diagnosis not present

## 2013-04-15 DIAGNOSIS — M25519 Pain in unspecified shoulder: Secondary | ICD-10-CM | POA: Diagnosis not present

## 2013-04-15 DIAGNOSIS — F1010 Alcohol abuse, uncomplicated: Secondary | ICD-10-CM

## 2013-04-15 DIAGNOSIS — M25619 Stiffness of unspecified shoulder, not elsewhere classified: Secondary | ICD-10-CM | POA: Diagnosis not present

## 2013-04-15 DIAGNOSIS — G20 Parkinson's disease: Secondary | ICD-10-CM

## 2013-04-15 DIAGNOSIS — R413 Other amnesia: Secondary | ICD-10-CM

## 2013-04-15 DIAGNOSIS — IMO0001 Reserved for inherently not codable concepts without codable children: Secondary | ICD-10-CM | POA: Diagnosis not present

## 2013-04-15 NOTE — Progress Notes (Signed)
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  The patient is accompanied by her husband who supplements the history.  The patient reports that she first noted some tremor in October at her PE when her L arm was lifted above the head.  She denies tremor otherwise but her husband noted tremor for a year when she went to pick up coffee.  She has been on abilify for depression for a few years.  She has not been on other atypical agents.  04/15/2013 update:  I did have the opportunity to review records since she was last here.  The patient needs a shoulder surgery and has been seen by the orthopedic physician, but he did not want to do the surgery until the patient discontinued her alcohol use.  She did end up having her surgery done on June 11.  She is now in physical therapy.  The patient does have a history of alcohol abuse.  She had stopped drinking prior to her orthopedic shoulder surgery on the left, but her husband states that she has started drinking again.  He states that her balance got markedly better when she stopped drinking.  The patient states that she is only drinking one drink per day now.  The patient's Abilify was discontinued last November, and her husband noticed an improvement in tremor.  She did have an MRI of the brain on 09/16/2012.  There was atrophy and moderate small vessel disease.  I did review this.  The formal report is below:   *RADIOLOGY REPORT*   Clinical Data: Memory loss.  Parkinsonism. History of hypertension and glucose tolerance.  History of alcohol abuse.   MRI HEAD WITHOUT CONTRAST   Technique:  Multiplanar, multiecho pulse sequences of the brain and surrounding structures were obtained according to standard protocol without intravenous contrast.   Comparison: 06/28/2010 CT.   Findings: There is no evidence for acute infarction, intracranial hemorrhage, mass lesion,  hydrocephalus, or extra-axial fluid. Moderate atrophy is present, with particular prominence of the sylvian fissures.   Extensive chronic microvascular ischemic change affects the periventricular and subcortical white matter as well as the pons.  No foci of chronic hemorrhage. Small remote right cerebellar infarct.  Intact calvarium and skull base.  Normal pituitary and cerebellar tonsils.  Unremarkable cervical region. Major intracranial vascular structures patent.  Mild chronic sinus disease.  Negative orbits and mastoids.   IMPRESSION: Moderate atrophy and extensive chronic microvascular ischemic change. Prominence of the  sylvian fissures could suggest disproportionate temporal lobe volume loss.  No acute intracranial abnormality.    PREVIOUS MEDICATIONS: none to date CURRENT MEDICATION THAT MAY EXACERBATE SX'S:  Abilify  ALLERGIES:   Allergies  Allergen Reactions  . Latex   . Penicillins     CURRENT MEDICATIONS:  Current Outpatient Prescriptions on File Prior to Visit  Medication Sig Dispense Refill  . ALPRAZolam (XANAX) 0.5 MG tablet TAKE 1 TABLET BY MOUTH 3 TIMES DAILY  90 tablet  1  . ascorbic acid (VITAMIN C) 500 MG tablet Take 500 mg by mouth daily.        Marland Kitchen atorvastatin (LIPITOR) 10 MG tablet TAKE 1 TABLET EVERY DAY  90 tablet  3  . B Complex-C (SUPER B COMPLEX PO) Take by mouth daily.        Marland Kitchen BENICAR HCT 40-12.5 MG per tablet TAKE 1 TABLET BY MOUTH DAILY  90 tablet  1  . budesonide-formoterol (SYMBICORT) 160-4.5 MCG/ACT  inhaler Inhale 2 puffs into the lungs 2 (two) times daily.      . CELEBREX 200 MG capsule TAKE ONE CAPSULE BY MOUTH EVERY DAY  90 capsule  1  . citalopram (CELEXA) 40 MG tablet Take 1 tablet (40 mg total) by mouth daily.  90 tablet  3  . Cyanocobalamin (B-12 DOTS) 500 MCG TBDP Take 1 tablet by mouth 2 (two) times daily.  60 tablet  2  . donepezil (ARICEPT) 10 MG tablet TAKE 1 TABLET BY MOUTH DAILY AT BEDTIME  30 tablet  0  . doxepin (SINEQUAN) 25  MG capsule TAKE 1 CAPSULE BY MOUTH DAILY AT BEDTIME  30 capsule  3  . fish oil-omega-3 fatty acids 1000 MG capsule Take 2 g by mouth daily.        . fluticasone (FLONASE) 50 MCG/ACT nasal spray USE ONCE DAILY AS DIRECTED  48 g  3  . HYDROcodone-acetaminophen (NORCO) 7.5-325 MG per tablet TAKE 1 TABLET EVERY 8 HOURS AS NEEDED  60 tablet  1  . pantoprazole (PROTONIX) 40 MG tablet TAKE 1 TABLET DAILY  90 tablet  3  . PREMARIN 0.625 MG tablet TAKE 1 TABLET BY MOUTH DAILY. TAKE DAILY FOR 21 DAYS THEN DO NOT TAKE FOR 7 DAYS.  90 tablet  1  . promethazine (PHENERGAN) 25 MG tablet TAKE 1 TABLET BY MOUTH EVERY 6 HOURS AS NEEDED FOR NAUSEA  20 tablet  0  . SYMBICORT 160-4.5 MCG/ACT inhaler INHALE 2 PUFFS INTO THE LUNG 2 TIMES A DAY  10.2 g  6  . thiamine (VITAMIN B-1) 100 MG tablet Take 1 tablet (100 mg total) by mouth 2 (two) times daily.  30 tablet  0   Current Facility-Administered Medications on File Prior to Visit  Medication Dose Route Frequency Provider Last Rate Last Dose  . methylPREDNISolone acetate (DEPO-MEDROL) injection 40 mg  40 mg Intra-articular Once Stacie Glaze, MD        PAST MEDICAL HISTORY:   Past Medical History  Diagnosis Date  . Hyperlipidemia   . ETOH abuse   . Allergy   . Diverticulosis   . Hypertension   . Depression   . Arthritis   . Asthma   . GERD (gastroesophageal reflux disease)   . Normal 24 hour ambulatory pH monitoring study     good acid suppression    PAST SURGICAL HISTORY:   Past Surgical History  Procedure Laterality Date  . Dobutamine stress echo  07/09/2001    normal  . Esophagogastroduodenoscopy  06/11/2006    normal  . Rotator cuff repair      right  . Abdominal hysterectomy    . Total hip arthroplasty      left 1991/right 2008  . Nasal sinus surgery  1990    SOCIAL HISTORY:   History   Social History  . Marital Status: Married    Spouse Name: N/A    Number of Children: N/A  . Years of Education: N/A   Occupational History  .  retired     [employer]   Social History Main Topics  . Smoking status: Never Smoker   . Smokeless tobacco: Never Used  . Alcohol Use: Yes     Comment: daily--vodka and coke - 2-3 drinks  . Drug Use: No  . Sexually Active: Yes   Other Topics Concern  . Not on file   Social History Narrative  . No narrative on file    FAMILY HISTORY:   Family Status  Relation Status Death Age  . Mother Deceased     CAD, PPM  . Father Deceased     CAD  . Sister Alive     3, healthy  . Brother Alive     2, CAD, esophageal CA  . Child Alive     healthy    ROS:  A complete 10 system review of systems was obtained and was unremarkable apart from what is mentioned above.  PHYSICAL EXAMINATION:    VITALS:   Filed Vitals:   04/15/13 1313  BP: 132/82  Pulse: 80  Temp: 98.5 F (36.9 C)  Resp: 18  Weight: 196 lb (88.905 kg)    GEN:  The patient appears stated age and is in NAD. HEENT:  Normocephalic, atraumatic.  The mucous membranes are moist. The superficial temporal arteries are without ropiness or tenderness. CV:  RRR Lungs:  CTAB Neck/HEME:  There are no carotid bruits bilaterally.  Neurological examination:  Orientation: A MoCA was done today and she scored a 17/30  She had trouble with trail making.  She stated that it was July 4 and it was July 8.   Cranial nerves: There is good facial symmetry. Pupils are equal round and reactive to light bilaterally. Fundoscopic exam reveals clear margins bilaterally. Extraocular muscles are intact. The visual fields are full to confrontational testing. The speech is fluent and clear. Soft palate rises symmetrically and there is no tongue deviation. Hearing is intact to conversational tone. Sensation: Sensation is intact to light and pinprick throughout (facial, trunk, extremities). Vibration is intact at the bilateral big toe. There is no extinction with double simultaneous stimulation. There is no sensory dermatomal level identified. Motor:  Strength is 5/5 in the bilateral upper and lower extremities.   Shoulder shrug is equal and symmetric.  There is no pronator drift. Deep tendon reflexes: Deep tendon reflexes are 2/4 at the bilateral biceps, triceps, brachioradialis, patella and achilles. Plantar responses are downgoing bilaterally.  Movement examination: Tone: There is normal tone in the bilateral upper extremities.  The tone in the lower extremities is normal.  Abnormal movements: There was no tremor noted.  No dyskinesia. Coordination:  There is no difficulty with rapid alternating movements today. Gait and Station: The patient has no difficulty arising out of a deep-seated chair without the use of the hands. The patient's stride length is normal..    LABS:  Lab Results  Component Value Date   VITAMINB12 592 08/22/2012   Lab Results  Component Value Date   FOLATE 20.6 08/22/2012   No results found for this basename: RPR     Chemistry      Component Value Date/Time   NA 136 08/22/2012 1449   K 3.8 08/22/2012 1449   CL 101 08/22/2012 1449   CO2 25 08/22/2012 1449   BUN 17 08/22/2012 1449   CREATININE 1.0 08/22/2012 1449      Component Value Date/Time   CALCIUM 8.8 08/22/2012 1449   ALKPHOS 66 11/03/2010 1415   AST 28 11/03/2010 1415   ALT 23 11/03/2010 1415   BILITOT 0.4 11/03/2010 1415     Lab Results  Component Value Date   WBC 8.5 08/22/2012   HGB 12.3 08/22/2012   HCT 38.5 08/22/2012   MCV 87.4 08/22/2012   PLT 361.0 08/22/2012     ASSESSMENT/PLAN  1.  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  I do not think that she has idiopathic Parkinson's disease.  -This has resolved off of the Abilify. 2.  memory loss.  -This seems much more significant than when she was here last visit.  She scored a 17/30 on her MoCA.  I talked with her husband and the patient and it is my recommendation that she discontinue driving unless she passes an occupational therapy driving evaluation.  -I discussed with the patient and her  husband the results of her MRI of the brain.  I suspect that the atrophy out of proportion to age is due to alcohol.  I talked with them about the importance of discontinuing alcohol altogether.  We talked about the importance of physical and mental exercises when it comes to memory building.  Greater than 50% of the 25 minute visit was in counseling. 3.  I will see the patient back on an as-needed basis.

## 2013-04-17 DIAGNOSIS — J309 Allergic rhinitis, unspecified: Secondary | ICD-10-CM | POA: Diagnosis not present

## 2013-04-19 ENCOUNTER — Other Ambulatory Visit: Payer: Self-pay | Admitting: Internal Medicine

## 2013-04-21 ENCOUNTER — Telehealth: Payer: Self-pay | Admitting: Internal Medicine

## 2013-04-21 NOTE — Telephone Encounter (Signed)
To: [place] (After Hours Triage) Fax: [phone] From: [facility] Date/ Time: 04/18/2013 5:36 PM Taken By: Jethro BolusElinor Dodge Facility: home Patient: Aimee, Jones DOB: 11/21/1945 Phone: [phone] Reason for Call: wants stronger meds for shoulder pain

## 2013-04-21 NOTE — Telephone Encounter (Signed)
Husband informed 0 needs to call ortho

## 2013-04-22 DIAGNOSIS — J309 Allergic rhinitis, unspecified: Secondary | ICD-10-CM | POA: Diagnosis not present

## 2013-04-23 DIAGNOSIS — Z4789 Encounter for other orthopedic aftercare: Secondary | ICD-10-CM | POA: Diagnosis not present

## 2013-04-24 DIAGNOSIS — J309 Allergic rhinitis, unspecified: Secondary | ICD-10-CM | POA: Diagnosis not present

## 2013-04-26 ENCOUNTER — Other Ambulatory Visit: Payer: Self-pay | Admitting: Internal Medicine

## 2013-04-29 DIAGNOSIS — J309 Allergic rhinitis, unspecified: Secondary | ICD-10-CM | POA: Diagnosis not present

## 2013-04-30 ENCOUNTER — Other Ambulatory Visit: Payer: Self-pay | Admitting: Internal Medicine

## 2013-05-06 DIAGNOSIS — J309 Allergic rhinitis, unspecified: Secondary | ICD-10-CM | POA: Diagnosis not present

## 2013-05-07 ENCOUNTER — Ambulatory Visit: Payer: Medicare Other | Admitting: Internal Medicine

## 2013-05-08 ENCOUNTER — Ambulatory Visit (INDEPENDENT_AMBULATORY_CARE_PROVIDER_SITE_OTHER): Payer: Medicare Other | Admitting: Internal Medicine

## 2013-05-08 ENCOUNTER — Encounter: Payer: Self-pay | Admitting: Internal Medicine

## 2013-05-08 VITALS — BP 144/80 | HR 84 | Temp 98.2°F | Resp 16 | Ht 66.75 in | Wt 190.0 lb

## 2013-05-08 DIAGNOSIS — K219 Gastro-esophageal reflux disease without esophagitis: Secondary | ICD-10-CM

## 2013-05-08 DIAGNOSIS — Z9889 Other specified postprocedural states: Secondary | ICD-10-CM

## 2013-05-08 DIAGNOSIS — F1010 Alcohol abuse, uncomplicated: Secondary | ICD-10-CM

## 2013-05-08 DIAGNOSIS — J45909 Unspecified asthma, uncomplicated: Secondary | ICD-10-CM

## 2013-05-08 DIAGNOSIS — I10 Essential (primary) hypertension: Secondary | ICD-10-CM

## 2013-05-08 MED ORDER — OXYCODONE-ACETAMINOPHEN 5-325 MG PO TABS: 1.0000 | ORAL_TABLET | Freq: Four times a day (QID) | ORAL | Status: DC | PRN

## 2013-05-08 NOTE — Progress Notes (Signed)
Subjective:    Patient ID: Aimee Jones, female    DOB: [birth year], 67 y.o.   MRN: [id number]  HPI Referred to opthamology for eye surgery Stable HTN in context of shoulder pain   Review of Systems  Constitutional: Negative for activity change, appetite change and fatigue.  HENT: Negative for ear pain, congestion, neck pain, postnasal drip and sinus pressure.   Eyes: Negative for redness and visual disturbance.  Respiratory: Positive for shortness of breath. Negative for cough and wheezing.   Gastrointestinal: Negative for abdominal pain and abdominal distention.  Genitourinary: Negative for dysuria, frequency and menstrual problem.  Musculoskeletal: Positive for myalgias and joint swelling. Negative for arthralgias.  Skin: Negative for rash and wound.  Neurological: Positive for speech difficulty and weakness. Negative for dizziness and headaches.  Hematological: Negative for adenopathy. Does not bruise/bleed easily.  Psychiatric/Behavioral: Negative for sleep disturbance and decreased concentration.   Past Medical History  Diagnosis Date  . Hyperlipidemia   . ETOH abuse   . Allergy   . Diverticulosis   . Hypertension   . Depression   . Arthritis   . Asthma   . GERD (gastroesophageal reflux disease)   . Normal 24 hour ambulatory pH monitoring study     good acid suppression    History   Social History  . Marital Status: Married    Spouse Name: N/A    Number of Children: N/A  . Years of Education: N/A   Occupational History  . retired     [employer]   Social History Main Topics  . Smoking status: Never Smoker   . Smokeless tobacco: Never Used  . Alcohol Use: Yes     Comment: daily--vodka and coke - 2-3 drinks  . Drug Use: No  . Sexually Active: Yes   Other Topics Concern  . Not on file   Social History Narrative  . No narrative on file    Past Surgical History  Procedure Laterality Date  . Dobutamine stress echo  07/09/2001    normal  .  Esophagogastroduodenoscopy  06/11/2006    normal  . Rotator cuff repair      right  . Abdominal hysterectomy    . Total hip arthroplasty      left 1991/right 2008  . Nasal sinus surgery  1990    History reviewed. No pertinent family history.  Allergies  Allergen Reactions  . Latex   . Penicillins     Current Outpatient Prescriptions on File Prior to Visit  Medication Sig Dispense Refill  . ALPRAZolam (XANAX) 0.5 MG tablet TAKE 1 TABLET BY MOUTH 3 TIMES DAILY  90 tablet  1  . ascorbic acid (VITAMIN C) 500 MG tablet Take 500 mg by mouth daily.        Marland Kitchen aspirin 81 MG tablet Take 81 mg by mouth daily.      Marland Kitchen atorvastatin (LIPITOR) 10 MG tablet TAKE 1 TABLET EVERY DAY  90 tablet  3  . B Complex-C (SUPER B COMPLEX PO) Take by mouth daily.        Marland Kitchen BENICAR HCT 40-12.5 MG per tablet TAKE 1 TABLET BY MOUTH DAILY  90 tablet  1  . budesonide-formoterol (SYMBICORT) 160-4.5 MCG/ACT inhaler Inhale 2 puffs into the lungs 2 (two) times daily.      . CELEBREX 200 MG capsule TAKE ONE CAPSULE BY MOUTH EVERY DAY  90 capsule  1  . citalopram (CELEXA) 40 MG tablet Take 1 tablet (40 mg total) by mouth  daily.  90 tablet  3  . Cyanocobalamin (B-12 DOTS) 500 MCG TBDP Take 1 tablet by mouth 2 (two) times daily.  60 tablet  2  . donepezil (ARICEPT) 10 MG tablet TAKE 1 TABLET BY MOUTH DAILY AT BEDTIME  30 tablet  0  . doxepin (SINEQUAN) 25 MG capsule TAKE 1 CAPSULE BY MOUTH DAILY AT BEDTIME  30 capsule  3  . doxepin (SINEQUAN) 25 MG capsule TAKE 1 CAPSULE BY MOUTH DAILY AT BEDTIME  30 capsule  3  . fish oil-omega-3 fatty acids 1000 MG capsule Take 2 g by mouth daily.        . fluticasone (FLONASE) 50 MCG/ACT nasal spray USE ONCE DAILY AS DIRECTED  48 g  3  . HYDROcodone-acetaminophen (NORCO) 7.5-325 MG per tablet TAKE 1 TABLET EVERY 8 HOURS AS NEEDED  60 tablet  1  . montelukast (SINGULAIR) 10 MG tablet       . oxyCODONE-acetaminophen (PERCOCET/ROXICET) 5-325 MG per tablet       . pantoprazole (PROTONIX) 40  MG tablet TAKE 1 TABLET DAILY  90 tablet  3  . pantoprazole (PROTONIX) 40 MG tablet TAKE 1 TABLET EVERY DAY  90 tablet  0  . PREMARIN 0.625 MG tablet TAKE 1 TABLET BY MOUTH DAILY. TAKE DAILY FOR 21 DAYS THEN DO NOT TAKE FOR 7 DAYS.  90 tablet  1  . promethazine (PHENERGAN) 25 MG tablet TAKE 1 TABLET BY MOUTH EVERY 6 HOURS AS NEEDED FOR NAUSEA  20 tablet  0  . SYMBICORT 160-4.5 MCG/ACT inhaler INHALE 2 PUFFS INTO THE LUNG 2 TIMES A DAY  10.2 g  6  . thiamine (VITAMIN B-1) 100 MG tablet Take 1 tablet (100 mg total) by mouth 2 (two) times daily.  30 tablet  0   Current Facility-Administered Medications on File Prior to Visit  Medication Dose Route Frequency Provider Last Rate Last Dose  . methylPREDNISolone acetate (DEPO-MEDROL) injection 40 mg  40 mg Intra-articular Once Stacie Glaze, MD        BP 144/80  Pulse 84  Temp(Src) 98.2 F (36.8 C)  Resp 16  Ht 5' 6.75" (1.695 m)  Wt 190 lb (86.183 kg)  BMI 30 kg/m2       Objective:   Physical Exam  Constitutional: She appears well-developed and well-nourished.  HENT:  Head: Normocephalic and atraumatic.  Cardiovascular: Normal rate and regular rhythm.   Murmur heard. Pulmonary/Chest: Effort normal and breath sounds normal.  Abdominal: Soft. Bowel sounds are normal.  Musculoskeletal: She exhibits edema and tenderness.          Assessment & Plan:  Postoperative shoulder pain on hydrocodone 02/16/2024 60x1 given today Stable hypertension slightly elevated today but in context of her shoulder pain\ Stable asthmawith use of inhaler Allergic rhinitis with the refill of Nasacort

## 2013-05-13 ENCOUNTER — Other Ambulatory Visit: Payer: Self-pay | Admitting: Neurology

## 2013-05-13 DIAGNOSIS — J309 Allergic rhinitis, unspecified: Secondary | ICD-10-CM | POA: Diagnosis not present

## 2013-05-14 DIAGNOSIS — H2510 Age-related nuclear cataract, unspecified eye: Secondary | ICD-10-CM | POA: Diagnosis not present

## 2013-05-14 DIAGNOSIS — H25049 Posterior subcapsular polar age-related cataract, unspecified eye: Secondary | ICD-10-CM | POA: Diagnosis not present

## 2013-05-14 DIAGNOSIS — H524 Presbyopia: Secondary | ICD-10-CM | POA: Diagnosis not present

## 2013-05-16 ENCOUNTER — Encounter: Payer: Self-pay | Admitting: Specialist

## 2013-05-16 DIAGNOSIS — IMO0001 Reserved for inherently not codable concepts without codable children: Secondary | ICD-10-CM | POA: Diagnosis not present

## 2013-05-16 DIAGNOSIS — M6281 Muscle weakness (generalized): Secondary | ICD-10-CM | POA: Diagnosis not present

## 2013-05-16 DIAGNOSIS — M25619 Stiffness of unspecified shoulder, not elsewhere classified: Secondary | ICD-10-CM | POA: Diagnosis not present

## 2013-05-16 DIAGNOSIS — M25519 Pain in unspecified shoulder: Secondary | ICD-10-CM | POA: Diagnosis not present

## 2013-05-20 DIAGNOSIS — J309 Allergic rhinitis, unspecified: Secondary | ICD-10-CM | POA: Diagnosis not present

## 2013-05-21 ENCOUNTER — Other Ambulatory Visit: Payer: Self-pay

## 2013-05-27 ENCOUNTER — Other Ambulatory Visit: Payer: Self-pay | Admitting: Neurology

## 2013-05-27 DIAGNOSIS — J309 Allergic rhinitis, unspecified: Secondary | ICD-10-CM | POA: Diagnosis not present

## 2013-05-29 DIAGNOSIS — J309 Allergic rhinitis, unspecified: Secondary | ICD-10-CM | POA: Diagnosis not present

## 2013-05-30 DIAGNOSIS — Z4789 Encounter for other orthopedic aftercare: Secondary | ICD-10-CM | POA: Diagnosis not present

## 2013-06-16 ENCOUNTER — Encounter: Payer: Self-pay | Admitting: Specialist

## 2013-06-25 ENCOUNTER — Other Ambulatory Visit: Payer: Self-pay | Admitting: *Deleted

## 2013-06-25 ENCOUNTER — Encounter: Payer: Self-pay | Admitting: Internal Medicine

## 2013-06-25 MED ORDER — DONEPEZIL HCL 10 MG PO TABS: ORAL_TABLET | ORAL | Status: DC

## 2013-07-01 DIAGNOSIS — H43819 Vitreous degeneration, unspecified eye: Secondary | ICD-10-CM | POA: Diagnosis not present

## 2013-07-01 DIAGNOSIS — H25049 Posterior subcapsular polar age-related cataract, unspecified eye: Secondary | ICD-10-CM | POA: Diagnosis not present

## 2013-07-01 DIAGNOSIS — H2510 Age-related nuclear cataract, unspecified eye: Secondary | ICD-10-CM | POA: Diagnosis not present

## 2013-07-01 DIAGNOSIS — H538 Other visual disturbances: Secondary | ICD-10-CM | POA: Diagnosis not present

## 2013-07-01 DIAGNOSIS — H25019 Cortical age-related cataract, unspecified eye: Secondary | ICD-10-CM | POA: Diagnosis not present

## 2013-07-02 DIAGNOSIS — Z23 Encounter for immunization: Secondary | ICD-10-CM | POA: Diagnosis not present

## 2013-07-03 DIAGNOSIS — J309 Allergic rhinitis, unspecified: Secondary | ICD-10-CM | POA: Diagnosis not present

## 2013-07-04 ENCOUNTER — Other Ambulatory Visit: Payer: Self-pay | Admitting: Neurology

## 2013-07-07 ENCOUNTER — Other Ambulatory Visit: Payer: Self-pay | Admitting: *Deleted

## 2013-07-07 ENCOUNTER — Encounter: Payer: Self-pay | Admitting: Internal Medicine

## 2013-07-09 ENCOUNTER — Telehealth: Payer: Self-pay | Admitting: Internal Medicine

## 2013-07-09 NOTE — Telephone Encounter (Signed)
Per dr Lovell Sheehan- please let dr Thomasena Edis give pain medication- husband notified

## 2013-07-09 NOTE — Telephone Encounter (Signed)
Patient /Aimee Jones([phone] called to request additional pain medication be called for severe right shoulder pain.  Has known ligament problem in right shoulder and history of surgery on both shoulders.  Recent Rx for Hydrocodone 5/325 mg Dispense #30; At times, had to take 3 daily instead of 2. Ran out of medication 07/09/13 after 1600 dose.  Declined triage. Has appointment with Dr Thomasena Edis at [facility] orthopedic 07/14/13. Request Rx [facility] to [facility] [address]  Please call back to advise if Rx will be ordered.

## 2013-07-10 DIAGNOSIS — J3089 Other allergic rhinitis: Secondary | ICD-10-CM | POA: Diagnosis not present

## 2013-07-10 DIAGNOSIS — J45909 Unspecified asthma, uncomplicated: Secondary | ICD-10-CM | POA: Diagnosis not present

## 2013-07-10 DIAGNOSIS — J309 Allergic rhinitis, unspecified: Secondary | ICD-10-CM | POA: Diagnosis not present

## 2013-07-10 DIAGNOSIS — J019 Acute sinusitis, unspecified: Secondary | ICD-10-CM | POA: Diagnosis not present

## 2013-07-10 DIAGNOSIS — H1045 Other chronic allergic conjunctivitis: Secondary | ICD-10-CM | POA: Diagnosis not present

## 2013-07-14 DIAGNOSIS — M75120 Complete rotator cuff tear or rupture of unspecified shoulder, not specified as traumatic: Secondary | ICD-10-CM | POA: Diagnosis not present

## 2013-07-15 DIAGNOSIS — H2510 Age-related nuclear cataract, unspecified eye: Secondary | ICD-10-CM | POA: Diagnosis not present

## 2013-07-15 DIAGNOSIS — H269 Unspecified cataract: Secondary | ICD-10-CM | POA: Diagnosis not present

## 2013-07-17 ENCOUNTER — Other Ambulatory Visit: Payer: Self-pay | Admitting: Internal Medicine

## 2013-07-22 DIAGNOSIS — J309 Allergic rhinitis, unspecified: Secondary | ICD-10-CM | POA: Diagnosis not present

## 2013-07-26 ENCOUNTER — Encounter: Payer: Self-pay | Admitting: Internal Medicine

## 2013-07-29 DIAGNOSIS — J309 Allergic rhinitis, unspecified: Secondary | ICD-10-CM | POA: Diagnosis not present

## 2013-07-31 ENCOUNTER — Other Ambulatory Visit: Payer: Self-pay | Admitting: Internal Medicine

## 2013-08-07 DIAGNOSIS — M19019 Primary osteoarthritis, unspecified shoulder: Secondary | ICD-10-CM | POA: Diagnosis not present

## 2013-08-07 DIAGNOSIS — M24119 Other articular cartilage disorders, unspecified shoulder: Secondary | ICD-10-CM | POA: Diagnosis not present

## 2013-08-07 DIAGNOSIS — M67919 Unspecified disorder of synovium and tendon, unspecified shoulder: Secondary | ICD-10-CM | POA: Diagnosis not present

## 2013-08-07 DIAGNOSIS — M7520 Bicipital tendinitis, unspecified shoulder: Secondary | ICD-10-CM | POA: Diagnosis not present

## 2013-08-07 DIAGNOSIS — M75110 Incomplete rotator cuff tear or rupture of unspecified shoulder, not specified as traumatic: Secondary | ICD-10-CM | POA: Diagnosis not present

## 2013-08-07 DIAGNOSIS — G8918 Other acute postprocedural pain: Secondary | ICD-10-CM | POA: Diagnosis not present

## 2013-08-08 DIAGNOSIS — H2510 Age-related nuclear cataract, unspecified eye: Secondary | ICD-10-CM | POA: Diagnosis not present

## 2013-08-10 ENCOUNTER — Other Ambulatory Visit: Payer: Self-pay | Admitting: Internal Medicine

## 2013-08-12 DIAGNOSIS — J309 Allergic rhinitis, unspecified: Secondary | ICD-10-CM | POA: Diagnosis not present

## 2013-08-14 ENCOUNTER — Encounter: Payer: Self-pay | Admitting: Specialist

## 2013-08-14 DIAGNOSIS — M25519 Pain in unspecified shoulder: Secondary | ICD-10-CM | POA: Diagnosis not present

## 2013-08-14 DIAGNOSIS — IMO0001 Reserved for inherently not codable concepts without codable children: Secondary | ICD-10-CM | POA: Diagnosis not present

## 2013-08-14 DIAGNOSIS — M6281 Muscle weakness (generalized): Secondary | ICD-10-CM | POA: Diagnosis not present

## 2013-08-15 ENCOUNTER — Other Ambulatory Visit: Payer: Self-pay | Admitting: Internal Medicine

## 2013-08-16 ENCOUNTER — Encounter: Payer: Self-pay | Admitting: Specialist

## 2013-08-16 DIAGNOSIS — M6281 Muscle weakness (generalized): Secondary | ICD-10-CM | POA: Diagnosis not present

## 2013-08-16 DIAGNOSIS — M25519 Pain in unspecified shoulder: Secondary | ICD-10-CM | POA: Diagnosis not present

## 2013-08-16 DIAGNOSIS — IMO0001 Reserved for inherently not codable concepts without codable children: Secondary | ICD-10-CM | POA: Diagnosis not present

## 2013-08-16 DIAGNOSIS — M25619 Stiffness of unspecified shoulder, not elsewhere classified: Secondary | ICD-10-CM | POA: Diagnosis not present

## 2013-08-18 ENCOUNTER — Encounter: Payer: Self-pay | Admitting: Internal Medicine

## 2013-08-18 ENCOUNTER — Ambulatory Visit (INDEPENDENT_AMBULATORY_CARE_PROVIDER_SITE_OTHER): Payer: Medicare Other | Admitting: Internal Medicine

## 2013-08-18 ENCOUNTER — Other Ambulatory Visit: Payer: Self-pay | Admitting: Internal Medicine

## 2013-08-18 ENCOUNTER — Other Ambulatory Visit: Payer: Self-pay | Admitting: *Deleted

## 2013-08-18 VITALS — BP 130/80 | HR 76 | Temp 98.0°F | Resp 16 | Ht 66.0 in | Wt 182.0 lb

## 2013-08-18 DIAGNOSIS — J45909 Unspecified asthma, uncomplicated: Secondary | ICD-10-CM

## 2013-08-18 DIAGNOSIS — N951 Menopausal and female climacteric states: Secondary | ICD-10-CM | POA: Diagnosis not present

## 2013-08-18 DIAGNOSIS — I10 Essential (primary) hypertension: Secondary | ICD-10-CM | POA: Diagnosis not present

## 2013-08-18 DIAGNOSIS — M1990 Unspecified osteoarthritis, unspecified site: Secondary | ICD-10-CM

## 2013-08-18 MED ORDER — NABUMETONE 750 MG PO TABS: 750.0000 mg | ORAL_TABLET | Freq: Two times a day (BID) | ORAL | Status: DC | PRN

## 2013-08-18 MED ORDER — ESTRADIOL 0.5 MG PO TABS: 0.5000 mg | ORAL_TABLET | Freq: Every day | ORAL | Status: DC

## 2013-08-18 NOTE — Progress Notes (Signed)
Subjective:    Patient ID: Aimee Jones, female    DOB: 1946-03-29, 67 y.o.   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 Negative for appetite change.  HENT: Negative for congestion, ear pain, postnasal drip and sinus pressure.   Eyes: Positive for photophobia and visual disturbance. Negative for redness.  Respiratory: Negative for cough, shortness of breath and wheezing.   Gastrointestinal: Negative for abdominal pain and abdominal distention.  Genitourinary: Negative for dysuria, frequency and menstrual problem.  Musculoskeletal: Positive for joint swelling, myalgias, neck pain and neck stiffness. Negative for arthralgias.  Skin: Negative for rash and wound.  Neurological: Negative for dizziness, weakness and headaches.  Hematological: Negative for adenopathy. Does not bruise/bleed easily.  Psychiatric/Behavioral: Negative for sleep disturbance and decreased concentration.   Past Medical History  Diagnosis Date  . Hyperlipidemia   . ETOH abuse   . Allergy   . Diverticulosis   . Hypertension   . Depression   . Arthritis   . Asthma   . GERD (gastroesophageal reflux disease)   . Normal 24 hour ambulatory pH monitoring study     good acid suppression    History   Social History  . Marital Status: Married    Spouse Name: N/A    Number of Children: N/A  . Years of Education: N/A   Occupational History  . retired     [employer]   Social History Main Topics  . Smoking status: Never Smoker   . Smokeless tobacco: Never Used  . Alcohol Use: Yes     Comment: daily--vodka and coke - 2-3 drinks  . Drug Use: No  . Sexual Activity: Yes   Other Topics Concern  . Not on file   Social History Narrative  . No narrative on file     Past Surgical History  Procedure Laterality Date  . Dobutamine stress echo  07/09/2001    normal  . Esophagogastroduodenoscopy  06/11/2006    normal  . Rotator cuff repair      right  . Abdominal hysterectomy    . Total hip arthroplasty      left 1991/right 2008  . Nasal sinus surgery  1990    No family history on file.  Allergies  Allergen Reactions  . Latex   . Penicillins     Current Outpatient Prescriptions on File Prior to Visit  Medication Sig Dispense Refill  . ALPRAZolam (XANAX) 0.5 MG tablet TAKE 1 TABLET BY MOUTH 3 TIMES DAILY  90 tablet  1  . ascorbic acid (VITAMIN C) 500 MG tablet Take 500 mg by mouth daily.        Marland Kitchen aspirin 81 MG tablet Take 81 mg by mouth daily.      Marland Kitchen atorvastatin (LIPITOR) 10 MG tablet TAKE 1 TABLET EVERY DAY  90 tablet  3  . B Complex-C (SUPER B COMPLEX PO) Take by mouth daily.        Marland Kitchen BENICAR HCT 40-12.5 MG per tablet TAKE 1 TABLET BY MOUTH DAILY  90 tablet  1  . budesonide-formoterol (SYMBICORT) 160-4.5 MCG/ACT inhaler Inhale 2 puffs into the lungs 2 (two) times daily.      . citalopram (CELEXA) 40 MG tablet Take 1 tablet (40 mg total) by mouth daily.  90 tablet  3  . Cyanocobalamin (B-12 DOTS) 500 MCG TBDP Take 1 tablet by mouth 2 (two) times daily.  60 tablet  2  . doxepin (SINEQUAN) 25 MG capsule TAKE 1 CAPSULE BY MOUTH DAILY AT BEDTIME  30 capsule  3  . fish oil-omega-3 fatty acids 1000 MG capsule Take 2 g by mouth daily.        . fluticasone (FLONASE) 50 MCG/ACT nasal spray USE ONCE DAILY AS DIRECTED  48 g  3  . montelukast (SINGULAIR) 10 MG tablet       . oxyCODONE-acetaminophen (ROXICET) 5-325 MG per tablet Take 1 tablet by mouth every 6 (six) hours as needed for pain.  75 tablet  0  . pantoprazole (PROTONIX) 40 MG tablet TAKE 1 TABLET DAILY  90 tablet  3  . promethazine (PHENERGAN) 25 MG tablet TAKE 1 TABLET BY MOUTH EVERY 6 HOURS AS NEEDED FOR NAUSEA  20 tablet  0  . SYMBICORT 160-4.5 MCG/ACT inhaler INHALE 2 PUFFS INTO THE  LUNG 2 TIMES A DAY  10.2 g  6  . thiamine (VITAMIN B-1) 100 MG tablet Take 1 tablet (100 mg total) by mouth 2 (two) times daily.  30 tablet  0   Current Facility-Administered Medications on File Prior to Visit  Medication Dose Route Frequency Provider Last Rate Last Dose  . methylPREDNISolone acetate (DEPO-MEDROL) injection 40 mg  40 mg Intra-articular Once Stacie Glaze, MD        BP 130/80  Pulse 76  Temp(Src) 98 F (36.7 C)  Resp 16  Ht 5\' 6"  (1.676 m)  Wt 182 lb (82.555 kg)  BMI 29.39 kg/m2        Objective:   Physical Exam  Nursing note and vitals reviewed. Constitutional: She appears well-developed and well-nourished.  HENT:  Head: Normocephalic and atraumatic.  Eyes: Conjunctivae are normal.  Neck: Normal range of motion. Neck supple.  Cardiovascular: Normal rate and regular rhythm.   Murmur heard. Pulmonary/Chest: Breath sounds normal.  Abdominal: Bowel sounds are normal. There is no tenderness.  Musculoskeletal: She exhibits edema and tenderness.  Skin: Skin is warm and dry.          Assessment & Plan:  Stable asthma on Symbicort.  Memory deficit with Aricept.  Stable hypertension on Benicar.  Reviewed medication list and due to formulary changes we'll change to Relafen and estradiol.  Continue Lipitor for hyperlipidemia

## 2013-08-18 NOTE — Patient Instructions (Signed)
The Estrace replaces the Premarin The Relafen replaces the Celebrex

## 2013-08-19 DIAGNOSIS — J309 Allergic rhinitis, unspecified: Secondary | ICD-10-CM | POA: Diagnosis not present

## 2013-08-26 DIAGNOSIS — H2510 Age-related nuclear cataract, unspecified eye: Secondary | ICD-10-CM | POA: Diagnosis not present

## 2013-08-26 DIAGNOSIS — H269 Unspecified cataract: Secondary | ICD-10-CM | POA: Diagnosis not present

## 2013-08-26 DIAGNOSIS — J309 Allergic rhinitis, unspecified: Secondary | ICD-10-CM | POA: Diagnosis not present

## 2013-08-30 ENCOUNTER — Other Ambulatory Visit: Payer: Self-pay | Admitting: Internal Medicine

## 2013-09-02 DIAGNOSIS — J309 Allergic rhinitis, unspecified: Secondary | ICD-10-CM | POA: Diagnosis not present

## 2013-09-09 DIAGNOSIS — J309 Allergic rhinitis, unspecified: Secondary | ICD-10-CM | POA: Diagnosis not present

## 2013-09-11 ENCOUNTER — Other Ambulatory Visit: Payer: Self-pay | Admitting: Internal Medicine

## 2013-09-15 ENCOUNTER — Other Ambulatory Visit: Payer: Self-pay | Admitting: Internal Medicine

## 2013-09-15 ENCOUNTER — Encounter: Payer: Self-pay | Admitting: Specialist

## 2013-09-15 DIAGNOSIS — M25619 Stiffness of unspecified shoulder, not elsewhere classified: Secondary | ICD-10-CM | POA: Diagnosis not present

## 2013-09-15 DIAGNOSIS — IMO0001 Reserved for inherently not codable concepts without codable children: Secondary | ICD-10-CM | POA: Diagnosis not present

## 2013-09-15 DIAGNOSIS — M6281 Muscle weakness (generalized): Secondary | ICD-10-CM | POA: Diagnosis not present

## 2013-09-15 DIAGNOSIS — M25519 Pain in unspecified shoulder: Secondary | ICD-10-CM | POA: Diagnosis not present

## 2013-09-16 DIAGNOSIS — J309 Allergic rhinitis, unspecified: Secondary | ICD-10-CM | POA: Diagnosis not present

## 2013-09-23 DIAGNOSIS — J309 Allergic rhinitis, unspecified: Secondary | ICD-10-CM | POA: Diagnosis not present

## 2013-09-25 DIAGNOSIS — M6281 Muscle weakness (generalized): Secondary | ICD-10-CM | POA: Diagnosis not present

## 2013-09-25 DIAGNOSIS — M25519 Pain in unspecified shoulder: Secondary | ICD-10-CM | POA: Diagnosis not present

## 2013-09-25 DIAGNOSIS — IMO0001 Reserved for inherently not codable concepts without codable children: Secondary | ICD-10-CM | POA: Diagnosis not present

## 2013-09-25 DIAGNOSIS — M25619 Stiffness of unspecified shoulder, not elsewhere classified: Secondary | ICD-10-CM | POA: Diagnosis not present

## 2013-09-30 DIAGNOSIS — J309 Allergic rhinitis, unspecified: Secondary | ICD-10-CM | POA: Diagnosis not present

## 2013-10-02 DIAGNOSIS — IMO0001 Reserved for inherently not codable concepts without codable children: Secondary | ICD-10-CM | POA: Diagnosis not present

## 2013-10-02 DIAGNOSIS — M25619 Stiffness of unspecified shoulder, not elsewhere classified: Secondary | ICD-10-CM | POA: Diagnosis not present

## 2013-10-02 DIAGNOSIS — M25519 Pain in unspecified shoulder: Secondary | ICD-10-CM | POA: Diagnosis not present

## 2013-10-02 DIAGNOSIS — M6281 Muscle weakness (generalized): Secondary | ICD-10-CM | POA: Diagnosis not present

## 2013-10-08 ENCOUNTER — Ambulatory Visit (INDEPENDENT_AMBULATORY_CARE_PROVIDER_SITE_OTHER): Payer: Medicare Other | Admitting: Family Medicine

## 2013-10-08 ENCOUNTER — Encounter: Payer: Self-pay | Admitting: Family Medicine

## 2013-10-08 VITALS — BP 142/68 | HR 111 | Temp 99.4°F | Ht 65.0 in | Wt 181.8 lb

## 2013-10-08 DIAGNOSIS — J440 Chronic obstructive pulmonary disease with acute lower respiratory infection: Secondary | ICD-10-CM

## 2013-10-08 DIAGNOSIS — J45909 Unspecified asthma, uncomplicated: Secondary | ICD-10-CM

## 2013-10-08 MED ORDER — DOXYCYCLINE HYCLATE 100 MG PO TABS: 100.0000 mg | ORAL_TABLET | Freq: Two times a day (BID) | ORAL | Status: DC

## 2013-10-08 MED ORDER — PREDNISONE 20 MG PO TABS: ORAL_TABLET | ORAL | Status: DC

## 2013-10-08 NOTE — Progress Notes (Signed)
Date:  10/08/2013   Name:  Aimee Jones   DOB:  1946-09-20   MRN:  [id number] Gender: female Age: 67 y.o.  Primary Physician:  Carrie Mew, MD   Chief Complaint: Cough and congestion in chest   Subjective:   History of Present Illness:  Aimee Jones is a 67 y.o. very pleasant female patient who presents with the following:  Dr. Lovell Sheehan patient with parksinson's, asthma here with acute illness and asthma. Chest congestion, some mild SOB.  Chest congestion and not feeling well.  No fever.  2-3 days ago, coughed up some green.  No recent hosp. O/w getting along well at home and using inhalers  Past Medical History, Surgical History, Social History, Family History, Problem List, Medications, and Allergies have been reviewed and updated if relevant.  Review of Systems: ROS: GEN: Acute illness details above GI: Tolerating PO intake GU: maintaining adequate hydration and urination Pulm: No SOB Interactive and getting along well at home.  Otherwise, ROS is as per the HPI.   Objective:   Physical Examination: BP 142/68  Pulse 111  Temp(Src) 99.4 F (37.4 C) (Oral)  Ht 5\' 5"  (1.651 m)  Wt 181 lb 12 oz (82.441 kg)  BMI 30.24 kg/m2  SpO2 94%   GEN: A and O x 3. WDWN. NAD.    ENT: Nose clear, ext NML.  No LAD.  No JVD.  TM's clear. Oropharynx clear.  PULM: Normal WOB, no distress. No crackles. Rare wheezing. CV: RRR, no M/G/R, No rubs, No JVD.   EXT: warm and well-perfused, No c/c/e. PSYCH: Pleasant and conversant.     Laboratory and Imaging Data:  Assessment & Plan:    Bronchitis, chronic obstructive w acute bronchitis  ASTHMA  Mild asthma exac, acute illness. Start doxy in high risk patient and given prednisone. Hold, but if wheezing worse at all, then fill.  There are no Patient Instructions on file for this visit.  Orders Today:  No orders of the defined types were placed in this encounter.    New medications, updates to list,  dose adjustments: Meds ordered this encounter  Medications  . predniSONE (DELTASONE) 20 MG tablet    Sig: 2 tabs po daily for 4 days, then 1 po daily for 4 days    Dispense:  12 tablet    Refill:  0  . doxycycline (VIBRA-TABS) 100 MG tablet    Sig: Take 1 tablet (100 mg total) by mouth 2 (two) times daily.    Dispense:  20 tablet    Refill:  0    Signed,  Dax Murguia T. Taeden Geller, MD, CAQ Sports Medicine  [facility] [address] [place] [address] Phone: [phone] Fax: [phone]  Updated Complete Medication List:   Medication List       This list is accurate as of: 10/08/13  2:35 PM.  Always use your most recent med list.               ALPRAZolam 0.5 MG tablet  Commonly known as:  XANAX  TAKE 1 TABLET BY MOUTH 3 TIMES DAILY     ascorbic acid 500 MG tablet  Commonly known as:  VITAMIN C  Take 500 mg by mouth daily.     aspirin 81 MG tablet  Take 81 mg by mouth daily.     atorvastatin 10 MG tablet  Commonly known as:  LIPITOR  TAKE 1 TABLET EVERY DAY     BENICAR HCT  40-12.5 MG per tablet  Generic drug:  olmesartan-hydrochlorothiazide  TAKE 1 TABLET BY MOUTH DAILY     citalopram 40 MG tablet  Commonly known as:  CELEXA  Take 1 tablet (40 mg total) by mouth daily.     Cyanocobalamin 500 MCG Tbdp  Commonly known as:  B-12 DOTS  Take 1 tablet by mouth 2 (two) times daily.     donepezil 10 MG tablet  Commonly known as:  ARICEPT  1 tablet at bedtime     doxepin 25 MG capsule  Commonly known as:  SINEQUAN  TAKE 1 CAPSULE BY MOUTH DAILY AT BEDTIME     doxycycline 100 MG tablet  Commonly known as:  VIBRA-TABS  Take 1 tablet (100 mg total) by mouth 2 (two) times daily.     estradiol 0.5 MG tablet  Commonly known as:  ESTRACE  Take 1 tablet (0.5 mg total) by mouth daily.     fish oil-omega-3 fatty acids 1000 MG capsule  Take 2 g by mouth daily.     fluticasone 50 MCG/ACT nasal spray  Commonly known as:  FLONASE  USE ONCE  DAILY AS DIRECTED     montelukast 10 MG tablet  Commonly known as:  SINGULAIR     nabumetone 750 MG tablet  Commonly known as:  RELAFEN  Take 1 tablet (750 mg total) by mouth 2 (two) times daily as needed for pain.     pantoprazole 40 MG tablet  Commonly known as:  PROTONIX  TAKE 1 TABLET DAILY     predniSONE 20 MG tablet  Commonly known as:  DELTASONE  2 tabs po daily for 4 days, then 1 po daily for 4 days     PREMARIN 0.625 MG tablet  Generic drug:  estrogens (conjugated)  TAKE 1 TABLET BY MOUTH DAILY FOR 21 DAYS THEN DO NOT TAKE FOR 7 DAYS.     promethazine 25 MG tablet  Commonly known as:  PHENERGAN  TAKE 1 TABLET BY MOUTH EVERY 6 HOURS AS NEEDED FOR NAUSEA     SUPER B COMPLEX PO  Take by mouth daily.     budesonide-formoterol 160-4.5 MCG/ACT inhaler  Commonly known as:  SYMBICORT  Inhale 2 puffs into the lungs 2 (two) times daily.     SYMBICORT 160-4.5 MCG/ACT inhaler  Generic drug:  budesonide-formoterol  INHALE 2 PUFFS INTO THE LUNG 2 TIMES A DAY     thiamine 100 MG tablet  Commonly known as:  VITAMIN B-1  Take 1 tablet (100 mg total) by mouth 2 (two) times daily.

## 2013-10-08 NOTE — Progress Notes (Signed)
Pre-visit discussion using our clinic review tool. No additional management support is needed unless otherwise documented below in the visit note.  

## 2013-10-12 ENCOUNTER — Encounter: Payer: Self-pay | Admitting: Internal Medicine

## 2013-10-14 DIAGNOSIS — J309 Allergic rhinitis, unspecified: Secondary | ICD-10-CM | POA: Diagnosis not present

## 2013-10-20 ENCOUNTER — Other Ambulatory Visit: Payer: Self-pay | Admitting: *Deleted

## 2013-10-20 ENCOUNTER — Encounter: Payer: Self-pay | Admitting: Internal Medicine

## 2013-10-20 DIAGNOSIS — Z79899 Other long term (current) drug therapy: Secondary | ICD-10-CM | POA: Diagnosis not present

## 2013-10-20 MED ORDER — HYDROCODONE-ACETAMINOPHEN 5-325 MG PO TABS: 1.0000 | ORAL_TABLET | Freq: Three times a day (TID) | ORAL | Status: DC | PRN

## 2013-10-21 DIAGNOSIS — J309 Allergic rhinitis, unspecified: Secondary | ICD-10-CM | POA: Diagnosis not present

## 2013-10-27 ENCOUNTER — Other Ambulatory Visit: Payer: Self-pay | Admitting: Internal Medicine

## 2013-10-28 DIAGNOSIS — H6120 Impacted cerumen, unspecified ear: Secondary | ICD-10-CM | POA: Diagnosis not present

## 2013-10-28 DIAGNOSIS — H9190 Unspecified hearing loss, unspecified ear: Secondary | ICD-10-CM | POA: Diagnosis not present

## 2013-10-28 DIAGNOSIS — J309 Allergic rhinitis, unspecified: Secondary | ICD-10-CM | POA: Diagnosis not present

## 2013-11-01 DIAGNOSIS — R05 Cough: Secondary | ICD-10-CM | POA: Diagnosis not present

## 2013-11-01 DIAGNOSIS — R059 Cough, unspecified: Secondary | ICD-10-CM | POA: Diagnosis not present

## 2013-11-01 DIAGNOSIS — J209 Acute bronchitis, unspecified: Secondary | ICD-10-CM | POA: Diagnosis not present

## 2013-11-01 DIAGNOSIS — J309 Allergic rhinitis, unspecified: Secondary | ICD-10-CM | POA: Diagnosis not present

## 2013-11-01 DIAGNOSIS — J069 Acute upper respiratory infection, unspecified: Secondary | ICD-10-CM | POA: Diagnosis not present

## 2013-11-04 DIAGNOSIS — J309 Allergic rhinitis, unspecified: Secondary | ICD-10-CM | POA: Diagnosis not present

## 2013-11-05 DIAGNOSIS — Z4789 Encounter for other orthopedic aftercare: Secondary | ICD-10-CM | POA: Diagnosis not present

## 2013-11-05 DIAGNOSIS — M75110 Incomplete rotator cuff tear or rupture of unspecified shoulder, not specified as traumatic: Secondary | ICD-10-CM | POA: Diagnosis not present

## 2013-11-06 ENCOUNTER — Telehealth: Payer: Self-pay | Admitting: Internal Medicine

## 2013-11-06 NOTE — Telephone Encounter (Signed)
Relevant patient education assigned to patient using Emmi. ° °

## 2013-11-10 ENCOUNTER — Other Ambulatory Visit: Payer: Self-pay | Admitting: *Deleted

## 2013-11-10 MED ORDER — HYDROCODONE-ACETAMINOPHEN 5-325 MG PO TABS: 1.0000 | ORAL_TABLET | Freq: Three times a day (TID) | ORAL | Status: DC | PRN

## 2013-11-11 DIAGNOSIS — J309 Allergic rhinitis, unspecified: Secondary | ICD-10-CM | POA: Diagnosis not present

## 2013-11-19 ENCOUNTER — Encounter: Payer: Self-pay | Admitting: Internal Medicine

## 2013-11-20 DIAGNOSIS — J309 Allergic rhinitis, unspecified: Secondary | ICD-10-CM | POA: Diagnosis not present

## 2013-11-25 DIAGNOSIS — J309 Allergic rhinitis, unspecified: Secondary | ICD-10-CM | POA: Diagnosis not present

## 2013-11-27 DIAGNOSIS — J309 Allergic rhinitis, unspecified: Secondary | ICD-10-CM | POA: Diagnosis not present

## 2013-12-09 DIAGNOSIS — J309 Allergic rhinitis, unspecified: Secondary | ICD-10-CM | POA: Diagnosis not present

## 2013-12-15 DIAGNOSIS — Z4789 Encounter for other orthopedic aftercare: Secondary | ICD-10-CM | POA: Diagnosis not present

## 2013-12-15 DIAGNOSIS — M75110 Incomplete rotator cuff tear or rupture of unspecified shoulder, not specified as traumatic: Secondary | ICD-10-CM | POA: Diagnosis not present

## 2013-12-16 DIAGNOSIS — J309 Allergic rhinitis, unspecified: Secondary | ICD-10-CM | POA: Diagnosis not present

## 2013-12-23 DIAGNOSIS — J309 Allergic rhinitis, unspecified: Secondary | ICD-10-CM | POA: Diagnosis not present

## 2013-12-29 ENCOUNTER — Ambulatory Visit (INDEPENDENT_AMBULATORY_CARE_PROVIDER_SITE_OTHER): Payer: Medicare Other | Admitting: Internal Medicine

## 2013-12-29 ENCOUNTER — Encounter: Payer: Self-pay | Admitting: Internal Medicine

## 2013-12-29 VITALS — BP 120/70 | HR 78 | Temp 98.2°F | Resp 20 | Ht 65.0 in | Wt 175.0 lb

## 2013-12-29 DIAGNOSIS — Z23 Encounter for immunization: Secondary | ICD-10-CM | POA: Diagnosis not present

## 2013-12-29 DIAGNOSIS — G894 Chronic pain syndrome: Secondary | ICD-10-CM

## 2013-12-29 NOTE — Addendum Note (Signed)
Addended by: Marian Sorrow on: 12/29/2013 04:09 PM   Modules accepted: Orders

## 2013-12-29 NOTE — Progress Notes (Signed)
Subjective:    Patient ID: Aimee Jones, female    DOB: [birth year], 68 y.o.   MRN: [id number]  HPI  Had a fall getting off the scale Has tremor and  Unstable gate No shoulder injury noted  Still using hydrocodone for pain Motrin and HC weight loss Noted and has noted decreased appetite Due tetanus and pneumonia today      Review of Systems  Constitutional: Positive for activity change. Negative for appetite change and fatigue.  HENT: Positive for postnasal drip, rhinorrhea, sinus pressure and tinnitus. Negative for congestion and ear pain.   Eyes: Negative for redness and visual disturbance.  Respiratory: Negative for cough, shortness of breath and wheezing.   Gastrointestinal: Negative for abdominal pain and abdominal distention.  Genitourinary: Positive for urgency. Negative for dysuria, frequency and menstrual problem.  Musculoskeletal: Positive for arthralgias, back pain and neck pain. Negative for joint swelling and myalgias.       Shoulder pain  Skin: Negative for rash and wound.  Neurological: Positive for tremors. Negative for dizziness and headaches.  Hematological: Negative for adenopathy. Does not bruise/bleed easily.  Psychiatric/Behavioral: Negative for sleep disturbance and decreased concentration.   Past Medical History  Diagnosis Date  . Hyperlipidemia   . ETOH abuse   . Allergy   . Diverticulosis   . Hypertension   . Depression   . Arthritis   . Asthma   . GERD (gastroesophageal reflux disease)   . Normal 24 hour ambulatory pH monitoring study     good acid suppression    History   Social History  . Marital Status: Married    Spouse Name: N/A    Number of Children: N/A  . Years of Education: N/A   Occupational History  . retired     [employer]   Social History Main Topics  . Smoking status: Never Smoker   . Smokeless tobacco: Never Used  . Alcohol Use: Yes     Comment: daily--vodka and coke - 2-3 drinks  . Drug Use: No  . Sexual  Activity: Yes   Other Topics Concern  . Not on file   Social History Narrative  . No narrative on file    Past Surgical History  Procedure Laterality Date  . Dobutamine stress echo  07/09/2001    normal  . Esophagogastroduodenoscopy  06/11/2006    normal  . Rotator cuff repair      right  . Abdominal hysterectomy    . Total hip arthroplasty      left 1991/right 2008  . Nasal sinus surgery  1990    No family history on file.  Allergies  Allergen Reactions  . Latex   . Penicillins     Current Outpatient Prescriptions on File Prior to Visit  Medication Sig Dispense Refill  . ALPRAZolam (XANAX) 0.5 MG tablet TAKE 1 TABLET BY MOUTH 3 TIMES A DAY AS NEEDED  90 tablet  3  . ascorbic acid (VITAMIN C) 500 MG tablet Take 500 mg by mouth daily.        Marland Kitchen aspirin 81 MG tablet Take 81 mg by mouth daily.      Marland Kitchen atorvastatin (LIPITOR) 10 MG tablet TAKE 1 TABLET EVERY DAY  90 tablet  3  . B Complex-C (SUPER B COMPLEX PO) Take by mouth daily.        Marland Kitchen BENICAR HCT 40-12.5 MG per tablet TAKE 1 TABLET BY MOUTH DAILY  90 tablet  1  . budesonide-formoterol (SYMBICORT) 160-4.5  MCG/ACT inhaler Inhale 2 puffs into the lungs 2 (two) times daily.      . citalopram (CELEXA) 40 MG tablet Take 1 tablet (40 mg total) by mouth daily.  90 tablet  3  . Cyanocobalamin (B-12 DOTS) 500 MCG TBDP Take 1 tablet by mouth 2 (two) times daily.  60 tablet  2  . donepezil (ARICEPT) 10 MG tablet 1 tablet at bedtime  90 tablet  0  . doxepin (SINEQUAN) 25 MG capsule TAKE 1 CAPSULE BY MOUTH DAILY AT BEDTIME  30 capsule  3  . doxycycline (VIBRA-TABS) 100 MG tablet Take 1 tablet (100 mg total) by mouth 2 (two) times daily.  20 tablet  0  . estradiol (ESTRACE) 0.5 MG tablet Take 1 tablet (0.5 mg total) by mouth daily.  30 tablet  11  . fish oil-omega-3 fatty acids 1000 MG capsule Take 2 g by mouth daily.        . fluticasone (FLONASE) 50 MCG/ACT nasal spray USE ONCE DAILY AS DIRECTED  48 g  3  . HYDROcodone-acetaminophen  (NORCO/VICODIN) 5-325 MG per tablet Take 1 tablet by mouth every 8 (eight) hours as needed for moderate pain.  90 tablet  0  . montelukast (SINGULAIR) 10 MG tablet       . nabumetone (RELAFEN) 750 MG tablet Take 1 tablet (750 mg total) by mouth 2 (two) times daily as needed for pain.  60 tablet  11  . pantoprazole (PROTONIX) 40 MG tablet TAKE 1 TABLET DAILY  90 tablet  3  . predniSONE (DELTASONE) 20 MG tablet 2 tabs po daily for 4 days, then 1 po daily for 4 days  12 tablet  0  . PREMARIN 0.625 MG tablet TAKE 1 TABLET BY MOUTH DAILY FOR 21 DAYS THEN DO NOT TAKE FOR 7 DAYS.  90 tablet  0  . promethazine (PHENERGAN) 25 MG tablet TAKE 1 TABLET BY MOUTH EVERY 6 HOURS AS NEEDED FOR NAUSEA  20 tablet  0  . SYMBICORT 160-4.5 MCG/ACT inhaler INHALE 2 PUFFS INTO THE LUNG 2 TIMES A DAY  10.2 g  6  . thiamine (VITAMIN B-1) 100 MG tablet Take 1 tablet (100 mg total) by mouth 2 (two) times daily.  30 tablet  0   Current Facility-Administered Medications on File Prior to Visit  Medication Dose Route Frequency Provider Last Rate Last Dose  . methylPREDNISolone acetate (DEPO-MEDROL) injection 40 mg  40 mg Intra-articular Once Ricard Dillon, MD        BP 120/70  Pulse 78  Temp(Src) 98.2 F (36.8 C) (Oral)  Resp 20  Ht 5\' 5"  (1.651 m)  Wt 175 lb (79.379 kg)  BMI 29.12 kg/m2       Objective:   Physical Exam  Constitutional: She is oriented to person, place, and time. She appears well-developed and well-nourished.  HENT:  Head: Normocephalic and atraumatic.  Neck: Neck supple.  Cardiovascular: Normal rate.   Murmur heard. Pulmonary/Chest: Effort normal and breath sounds normal.  Neurological: She is oriented to person, place, and time. She displays normal reflexes. Coordination abnormal.  Skin: Rash noted. There is erythema.  Bilateral knee excoriations and mild bruising          Assessment & Plan:  Memory issues with mild OBS Fall risk high Pain medication abuse potential high due to  ETOH use in past OBS and possible alzheimer Stable HTN  reviewed labs form November. refilll medications  Discuss the refillls

## 2013-12-29 NOTE — Progress Notes (Signed)
Pre-visit discussion using our clinic review tool. No additional management support is needed unless otherwise documented below in the visit note.  

## 2013-12-29 NOTE — Patient Instructions (Signed)
The patient is instructed to continue all medications as prescribed. Schedule followup with check out clerk upon leaving the clinic  

## 2013-12-30 DIAGNOSIS — J309 Allergic rhinitis, unspecified: Secondary | ICD-10-CM | POA: Diagnosis not present

## 2014-01-05 ENCOUNTER — Other Ambulatory Visit: Payer: Self-pay | Admitting: Internal Medicine

## 2014-01-06 DIAGNOSIS — J309 Allergic rhinitis, unspecified: Secondary | ICD-10-CM | POA: Diagnosis not present

## 2014-01-07 ENCOUNTER — Telehealth: Payer: Self-pay | Admitting: Internal Medicine

## 2014-01-07 NOTE — Telephone Encounter (Signed)
Pt is needing new rx HYDROcodone-acetaminophen (NORCO/VICODIN) 5-325 MG per tablet, please call when available for pick up,.

## 2014-01-08 DIAGNOSIS — H40019 Open angle with borderline findings, low risk, unspecified eye: Secondary | ICD-10-CM | POA: Diagnosis not present

## 2014-01-09 MED ORDER — HYDROCODONE-ACETAMINOPHEN 5-325 MG PO TABS: 1.0000 | ORAL_TABLET | Freq: Three times a day (TID) | ORAL | Status: DC | PRN

## 2014-01-09 NOTE — Telephone Encounter (Signed)
Ok per Dr Arnoldo Morale, rx will be ready for p/u on Monday

## 2014-01-09 NOTE — Telephone Encounter (Signed)
Pt aware.

## 2014-01-13 DIAGNOSIS — J309 Allergic rhinitis, unspecified: Secondary | ICD-10-CM | POA: Diagnosis not present

## 2014-01-19 DIAGNOSIS — Z4789 Encounter for other orthopedic aftercare: Secondary | ICD-10-CM | POA: Diagnosis not present

## 2014-01-19 DIAGNOSIS — M75110 Incomplete rotator cuff tear or rupture of unspecified shoulder, not specified as traumatic: Secondary | ICD-10-CM | POA: Diagnosis not present

## 2014-01-20 DIAGNOSIS — J309 Allergic rhinitis, unspecified: Secondary | ICD-10-CM | POA: Diagnosis not present

## 2014-01-25 ENCOUNTER — Encounter: Payer: Self-pay | Admitting: Internal Medicine

## 2014-01-27 DIAGNOSIS — J309 Allergic rhinitis, unspecified: Secondary | ICD-10-CM | POA: Diagnosis not present

## 2014-01-29 DIAGNOSIS — J3089 Other allergic rhinitis: Secondary | ICD-10-CM | POA: Diagnosis not present

## 2014-01-29 DIAGNOSIS — J45909 Unspecified asthma, uncomplicated: Secondary | ICD-10-CM | POA: Diagnosis not present

## 2014-01-29 DIAGNOSIS — H1045 Other chronic allergic conjunctivitis: Secondary | ICD-10-CM | POA: Diagnosis not present

## 2014-01-30 MED ORDER — OMEPRAZOLE 40 MG PO CPDR: 40.0000 mg | DELAYED_RELEASE_CAPSULE | Freq: Every day | ORAL | Status: DC

## 2014-01-30 NOTE — Telephone Encounter (Signed)
Prilosec 40 mg sent to pt pharmacy to replace pantoprazole

## 2014-01-31 ENCOUNTER — Encounter: Payer: Self-pay | Admitting: Internal Medicine

## 2014-02-03 DIAGNOSIS — J309 Allergic rhinitis, unspecified: Secondary | ICD-10-CM | POA: Diagnosis not present

## 2014-02-04 ENCOUNTER — Other Ambulatory Visit: Payer: Self-pay | Admitting: Internal Medicine

## 2014-02-09 ENCOUNTER — Encounter: Payer: Self-pay | Admitting: Internal Medicine

## 2014-02-10 DIAGNOSIS — J309 Allergic rhinitis, unspecified: Secondary | ICD-10-CM | POA: Diagnosis not present

## 2014-02-11 MED ORDER — ESOMEPRAZOLE MAGNESIUM 40 MG PO CPDR: 40.0000 mg | DELAYED_RELEASE_CAPSULE | Freq: Every day | ORAL | Status: DC

## 2014-02-17 DIAGNOSIS — J309 Allergic rhinitis, unspecified: Secondary | ICD-10-CM | POA: Diagnosis not present

## 2014-02-20 ENCOUNTER — Other Ambulatory Visit: Payer: Self-pay | Admitting: *Deleted

## 2014-02-20 MED ORDER — RANITIDINE HCL 300 MG PO TABS: 300.0000 mg | ORAL_TABLET | Freq: Two times a day (BID) | ORAL | Status: DC

## 2014-02-24 DIAGNOSIS — J309 Allergic rhinitis, unspecified: Secondary | ICD-10-CM | POA: Diagnosis not present

## 2014-02-28 ENCOUNTER — Other Ambulatory Visit: Payer: Self-pay | Admitting: Internal Medicine

## 2014-03-03 DIAGNOSIS — J309 Allergic rhinitis, unspecified: Secondary | ICD-10-CM | POA: Diagnosis not present

## 2014-03-10 ENCOUNTER — Other Ambulatory Visit: Payer: Self-pay | Admitting: Internal Medicine

## 2014-03-10 DIAGNOSIS — J309 Allergic rhinitis, unspecified: Secondary | ICD-10-CM | POA: Diagnosis not present

## 2014-03-13 DIAGNOSIS — J309 Allergic rhinitis, unspecified: Secondary | ICD-10-CM | POA: Diagnosis not present

## 2014-03-14 ENCOUNTER — Other Ambulatory Visit: Payer: Self-pay | Admitting: Internal Medicine

## 2014-03-17 DIAGNOSIS — J309 Allergic rhinitis, unspecified: Secondary | ICD-10-CM | POA: Diagnosis not present

## 2014-03-18 ENCOUNTER — Telehealth: Payer: Self-pay | Admitting: Internal Medicine

## 2014-03-18 NOTE — Telephone Encounter (Signed)
Pt needs new rx hydrocodone °

## 2014-03-19 MED ORDER — HYDROCODONE-ACETAMINOPHEN 5-325 MG PO TABS: 1.0000 | ORAL_TABLET | Freq: Three times a day (TID) | ORAL | Status: DC | PRN

## 2014-03-19 NOTE — Telephone Encounter (Signed)
Ok per Aimee Jones, rx will be ready tomorrow afternoon, pt aware

## 2014-03-23 DIAGNOSIS — H26499 Other secondary cataract, unspecified eye: Secondary | ICD-10-CM | POA: Diagnosis not present

## 2014-03-23 DIAGNOSIS — Z961 Presence of intraocular lens: Secondary | ICD-10-CM | POA: Diagnosis not present

## 2014-03-23 DIAGNOSIS — H40019 Open angle with borderline findings, low risk, unspecified eye: Secondary | ICD-10-CM | POA: Diagnosis not present

## 2014-03-24 DIAGNOSIS — J309 Allergic rhinitis, unspecified: Secondary | ICD-10-CM | POA: Diagnosis not present

## 2014-03-29 ENCOUNTER — Other Ambulatory Visit: Payer: Self-pay | Admitting: Internal Medicine

## 2014-03-31 DIAGNOSIS — J309 Allergic rhinitis, unspecified: Secondary | ICD-10-CM | POA: Diagnosis not present

## 2014-04-07 DIAGNOSIS — J309 Allergic rhinitis, unspecified: Secondary | ICD-10-CM | POA: Diagnosis not present

## 2014-04-14 DIAGNOSIS — J309 Allergic rhinitis, unspecified: Secondary | ICD-10-CM | POA: Diagnosis not present

## 2014-04-21 DIAGNOSIS — J309 Allergic rhinitis, unspecified: Secondary | ICD-10-CM | POA: Diagnosis not present

## 2014-04-28 DIAGNOSIS — J309 Allergic rhinitis, unspecified: Secondary | ICD-10-CM | POA: Diagnosis not present

## 2014-05-05 DIAGNOSIS — J309 Allergic rhinitis, unspecified: Secondary | ICD-10-CM | POA: Diagnosis not present

## 2014-05-06 ENCOUNTER — Telehealth: Payer: Self-pay | Admitting: Family Medicine

## 2014-05-06 ENCOUNTER — Encounter: Payer: Self-pay | Admitting: Internal Medicine

## 2014-05-06 ENCOUNTER — Ambulatory Visit (INDEPENDENT_AMBULATORY_CARE_PROVIDER_SITE_OTHER): Payer: Medicare Other | Admitting: Internal Medicine

## 2014-05-06 VITALS — BP 122/68 | HR 87 | Temp 98.3°F | Wt 176.0 lb

## 2014-05-06 DIAGNOSIS — R3915 Urgency of urination: Secondary | ICD-10-CM

## 2014-05-06 DIAGNOSIS — R350 Frequency of micturition: Secondary | ICD-10-CM

## 2014-05-06 LAB — POCT URINALYSIS DIPSTICK
BILIRUBIN UA: NEGATIVE
Blood, UA: NEGATIVE
GLUCOSE UA: NEGATIVE
KETONES UA: NEGATIVE
Nitrite, UA: NEGATIVE
Protein, UA: NEGATIVE
SPEC GRAV UA: 1.015
UROBILINOGEN UA: 0.2
pH, UA: 6

## 2014-05-06 MED ORDER — NITROFURANTOIN MONOHYD MACRO 100 MG PO CAPS: 100.0000 mg | ORAL_CAPSULE | Freq: Two times a day (BID) | ORAL | Status: DC

## 2014-05-06 NOTE — Progress Notes (Addendum)
HPI  Pt presents to the clinic today with c/o urgency and frequency. She reports this started 1 day ago. She does have some associated pain near her groin. She denies burning or pain with urination. She denies fever, chills or nausea. She has not tried anything OTC.   Review of Systems  Past Medical History  Diagnosis Date  . Hyperlipidemia   . ETOH abuse   . Allergy   . Diverticulosis   . Hypertension   . Depression   . Arthritis   . Asthma   . GERD (gastroesophageal reflux disease)   . Normal 24 hour ambulatory pH monitoring study     good acid suppression    No family history on file.  History   Social History  . Marital Status: Married    Spouse Name: N/A    Number of Children: N/A  . Years of Education: N/A   Occupational History  . retired     [employer]   Social History Main Topics  . Smoking status: Never Smoker   . Smokeless tobacco: Never Used  . Alcohol Use: Yes     Comment: daily--vodka and coke - 2-3 drinks  . Drug Use: No  . Sexual Activity: Yes   Other Topics Concern  . Not on file   Social History Narrative  . No narrative on file    Allergies  Allergen Reactions  . Latex   . Penicillins     Constitutional: Denies fever, malaise, fatigue, headache or abrupt weight changes.   GU: Pt reports urgency, frequency. Denies burning sensation, blood in urine, odor or discharge. Skin: Denies redness, rashes, lesions or ulcercations.   No other specific complaints in a complete review of systems (except as listed in HPI above).    Objective:   Physical Exam  BP 122/68  Pulse 87  Temp(Src) 98.3 F (36.8 C) (Oral)  SpO2 97% Wt Readings from Last 3 Encounters:  12/29/13 175 lb (79.379 kg)  10/08/13 181 lb 12 oz (82.441 kg)  08/18/13 182 lb (82.555 kg)    General: Appears her stated age, well developed, well nourished in NAD. Cardiovascular: Normal rate and rhythm. S1,S2 noted.  No murmur, rubs or gallops noted. No JVD or BLE edema. No  carotid bruits noted. Pulmonary/Chest: Normal effort and positive vesicular breath sounds. No respiratory distress. No wheezes, rales or ronchi noted.  Abdomen: Soft . Normal bowel sounds, no bruits noted. No distention or masses noted. Liver, spleen and kidneys non palpable. Tender to palpation over the bladder area. No CVA tenderness.      Assessment & Plan:   Urgency, Frequency, secondary to possible UTI   Urinalysis: small bili, moderate leuks Will send urine culture eRx sent if for Macrobid 100 mg BID x 5 days OK to take AZO OTC Drink plenty of fluids  RTC as needed or if symptoms persist.

## 2014-05-06 NOTE — Addendum Note (Signed)
Addended by: Lurlean Nanny on: 05/06/2014 02:57 PM   Modules accepted: Orders

## 2014-05-06 NOTE — Telephone Encounter (Signed)
error 

## 2014-05-06 NOTE — Patient Instructions (Addendum)

## 2014-05-08 LAB — URINE CULTURE

## 2014-05-12 DIAGNOSIS — J309 Allergic rhinitis, unspecified: Secondary | ICD-10-CM | POA: Diagnosis not present

## 2014-05-19 DIAGNOSIS — J309 Allergic rhinitis, unspecified: Secondary | ICD-10-CM | POA: Diagnosis not present

## 2014-05-21 ENCOUNTER — Telehealth: Payer: Self-pay | Admitting: Neurology

## 2014-05-21 NOTE — Telephone Encounter (Signed)
Spoke with [facility] - in their database the only narcotic medication history is for hydrocodone 5/325 filled as follows: 03/23/2014 #90 01/12/2014 #90 11/20/2013 #90 10/20/2013 #90

## 2014-05-22 ENCOUNTER — Encounter: Payer: Self-pay | Admitting: Neurology

## 2014-05-22 ENCOUNTER — Ambulatory Visit (INDEPENDENT_AMBULATORY_CARE_PROVIDER_SITE_OTHER): Payer: Medicare Other | Admitting: Neurology

## 2014-05-22 VITALS — BP 100/62 | HR 72 | Resp 18 | Ht 67.0 in | Wt 173.0 lb

## 2014-05-22 DIAGNOSIS — F1027 Alcohol dependence with alcohol-induced persisting dementia: Secondary | ICD-10-CM

## 2014-05-22 DIAGNOSIS — F1010 Alcohol abuse, uncomplicated: Secondary | ICD-10-CM

## 2014-05-22 DIAGNOSIS — R4182 Altered mental status, unspecified: Secondary | ICD-10-CM | POA: Diagnosis not present

## 2014-05-22 DIAGNOSIS — G9340 Encephalopathy, unspecified: Secondary | ICD-10-CM | POA: Diagnosis not present

## 2014-05-22 LAB — CBC WITH DIFFERENTIAL/PLATELET
BASOS PCT: 1 % (ref 0–1)
Basophils Absolute: 0.1 10*3/uL (ref 0.0–0.1)
Eosinophils Absolute: 0.4 10*3/uL (ref 0.0–0.7)
Eosinophils Relative: 3 % (ref 0–5)
HEMATOCRIT: 34.7 % — AB (ref 36.0–46.0)
Hemoglobin: 11.7 g/dL — ABNORMAL LOW (ref 12.0–15.0)
LYMPHS PCT: 25 % (ref 12–46)
Lymphs Abs: 3.1 10*3/uL (ref 0.7–4.0)
MCH: 29 pg (ref 26.0–34.0)
MCHC: 33.7 g/dL (ref 30.0–36.0)
MCV: 86.1 fL (ref 78.0–100.0)
MONO ABS: 1 10*3/uL (ref 0.1–1.0)
Monocytes Relative: 8 % (ref 3–12)
NEUTROS ABS: 7.7 10*3/uL (ref 1.7–7.7)
NEUTROS PCT: 63 % (ref 43–77)
PLATELETS: 616 10*3/uL — AB (ref 150–400)
RBC: 4.03 MIL/uL (ref 3.87–5.11)
RDW: 14.5 % (ref 11.5–15.5)
WBC: 12.3 10*3/uL — AB (ref 4.0–10.5)

## 2014-05-22 NOTE — Patient Instructions (Addendum)
1. Restart Thiamine 100 mg once daily. 2. Discontinue alcohol.  3. You have been referred to Dr Nathanial Rancher for Neuro Psych testing. They will call you directly to schedule an appointment. Please call [phone] if you do not hear from them.  4. We have scheduled you at [facility] for your MRI on 05/29/14 at 5:00 pm. Please arrive 15 minutes prior and [place]. If you need to reschedule for any reason please call [phone].

## 2014-05-22 NOTE — Progress Notes (Signed)
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  The patient is accompanied by her husband who supplements the history.  The patient reports that she first noted some tremor in October at her PE when her L arm was lifted above the head.  She denies tremor otherwise but her husband noted tremor for a year when she went to pick up coffee.  She has been on abilify for depression for a few years.  She has not been on other atypical agents.  04/15/2013 update:  I did have the opportunity to review records since she was last here.  The patient needs a shoulder surgery and has been seen by the orthopedic physician, but he did not want to do the surgery until the patient discontinued her alcohol use.  She did end up having her surgery done on June 11.  She is now in physical therapy.  The patient does have a history of alcohol abuse.  She had stopped drinking prior to her orthopedic shoulder surgery on the left, but her husband states that she has started drinking again.  He states that her balance got markedly better when she stopped drinking.  The patient states that she is only drinking one drink per day now.  The patient's Abilify was discontinued last November, and her husband noticed an improvement in tremor.  05/22/14 update: Pt has a hx of parkinsonism, that had resolved off of abilify.  She has not been seen in over a year.  At last visit, I had noted memory loss and recommended that she not drive unless she have and pass a medical driving evaluation.  Her husband states that memory has dramatically deteriorated.  She came to her husband recently with a wrapper around cheese and tried to put it around candy and was just confused.  On her way in today, she was bending over in the parking lot picking up trash below cars.  He tried to get her into a [facility] al[facility]heimers study but she was turned down because of past EtOH abuse and  narcotic meds.  She is still drinking but her husband states that he controls it completely.  He gives her 4 oz per day of liquor.  He states that he just isn't sure that he wants to "deny her a little pleasure."  No falls since her husband took over distrubution of the EtOH.   Rare visual and auditory hallucinations.  Last driving was 3 weeks ago but her husband doesn't plan on having her drive any longer.  She wears depends for 3 years.  Her husband has a wood working shop next to the house but he is starting to think that he needs to turn off the breaker to the stove when he leaves.  She takes 2 xanax at night for sleep.  She has doxepin but doesn't take it.  She does take 4 ibuprofen in the AM and 2 tylenol at night for shoulder pain and one time per week will take 7.5 mg of hydrocodone.  No longer on thiamine.    She did have an MRI of the brain on 09/16/2012.  There was atrophy and moderate small vessel disease.  I did review this.  The formal report is below:   *RADIOLOGY REPORT*   Clinical Data: Memory loss.  Parkinsonism. History of hypertension and glucose tolerance.  History of alcohol abuse.   MRI HEAD WITHOUT CONTRAST   Technique:  Multiplanar, multiecho  pulse sequences of the brain and surrounding structures were obtained according to standard protocol without intravenous contrast.   Comparison: 06/28/2010 CT.   Findings: There is no evidence for acute infarction, intracranial hemorrhage, mass lesion, hydrocephalus, or extra-axial fluid. Moderate atrophy is present, with particular prominence of the sylvian fissures.   Extensive chronic microvascular ischemic change affects the periventricular and subcortical white matter as well as the pons.  No foci of chronic hemorrhage. Small remote right cerebellar infarct.  Intact calvarium and skull base.  Normal pituitary and cerebellar tonsils.  Unremarkable cervical region. Major intracranial vascular structures patent.  Mild chronic  sinus disease.  Negative orbits and mastoids.   IMPRESSION: Moderate atrophy and extensive chronic microvascular ischemic change. Prominence of the  sylvian fissures could suggest disproportionate temporal lobe volume loss.  No acute intracranial abnormality.    PREVIOUS MEDICATIONS: none to date   ALLERGIES:   Allergies  Allergen Reactions  . Latex   . Penicillins     CURRENT MEDICATIONS:  Current Outpatient Prescriptions on File Prior to Visit  Medication Sig Dispense Refill  . ALPRAZolam (XANAX) 0.5 MG tablet TAKE 1 TABLET THREE TIMES A DAY AS NEEDED  90 tablet  5  . aspirin 81 MG tablet Take 81 mg by mouth daily.      Marland Kitchen atorvastatin (LIPITOR) 10 MG tablet TAKE 1 TABLET EVERY DAY  90 tablet  3  . B Complex-C (SUPER B COMPLEX PO) Take 150 mg by mouth daily.       . citalopram (CELEXA) 40 MG tablet TAKE 1 TABLET (40 MG TOTAL) BY MOUTH DAILY.  90 tablet  3  . donepezil (ARICEPT) 10 MG tablet 1 tablet at bedtime  90 tablet  0  . estradiol (ESTRACE) 0.5 MG tablet Take 1 tablet (0.5 mg total) by mouth daily.  30 tablet  11  . fish oil-omega-3 fatty acids 1000 MG capsule Take 2 g by mouth daily.        . montelukast (SINGULAIR) 10 MG tablet Take 10 mg by mouth at bedtime.        Current Facility-Administered Medications on File Prior to Visit  Medication Dose Route Frequency Provider Last Rate Last Dose  . methylPREDNISolone acetate (DEPO-MEDROL) injection 40 mg  40 mg Intra-articular Once Ricard Dillon, MD        PAST MEDICAL HISTORY:   Past Medical History  Diagnosis Date  . Hyperlipidemia   . ETOH abuse   . Allergy   . Diverticulosis   . Hypertension   . Depression   . Arthritis   . Asthma   . GERD (gastroesophageal reflux disease)   . Normal 24 hour ambulatory pH monitoring study     good acid suppression    PAST SURGICAL HISTORY:   Past Surgical History  Procedure Laterality Date  . Dobutamine stress echo  07/09/2001    normal  . Esophagogastroduodenoscopy   06/11/2006    normal  . Rotator cuff repair      right  . Abdominal hysterectomy    . Total hip arthroplasty      left 1991/right 2008  . Nasal sinus surgery  1990    SOCIAL HISTORY:   History   Social History  . Marital Status: Married    Spouse Name: N/A    Number of Children: N/A  . Years of Education: N/A   Occupational History  . retired     [employer]   Social History Main Topics  . Smoking status:  Never Smoker   . Smokeless tobacco: Never Used  . Alcohol Use: Yes     Comment: daily--vodka and coke - 2-3 drinks  . Drug Use: No  . Sexual Activity: Yes   Other Topics Concern  . Not on file   Social History Narrative  . No narrative on file    FAMILY HISTORY:   Family Status  Relation Status Death Age  . Mother Deceased     CAD, PPM  . Father Deceased     CAD  . Sister Alive     3, healthy  . Brother Alive     2, CAD, esophageal CA  . Child Alive     healthy    ROS:  A complete 10 system review of systems was obtained and was unremarkable apart from what is mentioned above.  PHYSICAL EXAMINATION:    VITALS:   Filed Vitals:   05/22/14 1052  BP: 100/62  Pulse: 72  Resp: 18  Height: 5\' 7"  (1.702 m)  Weight: 173 lb (78.472 kg)    GEN:  The patient appears stated age and is in NAD. HEENT:  Normocephalic, atraumatic.  The mucous membranes are moist. The superficial temporal arteries are without ropiness or tenderness. CV:  RRR Lungs:  CTAB Neck/HEME:  There are no carotid bruits bilaterally.  Neurological examination:  Orientation: A MoCA was done today and she scored a 7/30.  Last time, she scored a 17/30.    Cranial nerves: There is good facial symmetry. Pupils are equal round and reactive to light bilaterally. Fundoscopic exam reveals clear margins bilaterally. Extraocular muscles are intact. The visual fields are full to confrontational testing. The speech is fluent and clear. Soft palate rises symmetrically and there is no tongue  deviation. Hearing is intact to conversational tone. Sensation: Sensation is intact to light touch throughout. Motor: Strength is 5/5 in the bilateral upper and lower extremities.   Shoulder shrug is equal and symmetric.  There is no pronator drift. Deep tendon reflexes: Deep tendon reflexes are 2/4 at the bilateral biceps, triceps, brachioradialis, patella and achilles. Plantar responses are downgoing bilaterally.  Movement examination: Tone: There is normal tone in the bilateral upper extremities.  The tone in the lower extremities is normal.  Abnormal movements: There was no tremor noted.  No dyskinesia.  No asterixis. Coordination:  There is no difficulty with rapid alternating movements today. Gait and Station: The patient has no difficulty arising out of a deep-seated chair without the use of the hands. The patient's stride length is normal..    LABS:  Lab Results  Component Value Date   JJOACZYS06 301 08/22/2012   Lab Results  Component Value Date   FOLATE 20.6 08/22/2012   No results found for this basename: RPR     Chemistry      Component Value Date/Time   NA 136 08/22/2012 1449   K 3.8 08/22/2012 1449   CL 101 08/22/2012 1449   CO2 25 08/22/2012 1449   BUN 17 08/22/2012 1449   CREATININE 1.0 08/22/2012 1449      Component Value Date/Time   CALCIUM 8.8 08/22/2012 1449   ALKPHOS 66 11/03/2010 1415   AST 28 11/03/2010 1415   ALT 23 11/03/2010 1415   BILITOT 0.4 11/03/2010 1415     Lab Results  Component Value Date   WBC 8.5 08/22/2012   HGB 12.3 08/22/2012   HCT 38.5 08/22/2012   MCV 87.4 08/22/2012   PLT 361.0 08/22/2012  ASSESSMENT/PLAN  1.  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  I do not think that she has idiopathic Parkinson's disease.  -This has resolved off of the Abilify. 2.  EtOH dementia  -This continues to deteriorate.  She may have a component of Wernicke's encephalopathy, but I don't think so.  It is my recommendation that alcohol be discontinued  altogether.  This needs to be done under the care of her primary care physician. I stressed this.    Much greater than 50% of this 45 minute visit was spent in counseling.  It is my recommendation that she begin thiamine, 100 mg daily.  She used to be on this, but somehow it got stopped.  She is also on several medications that can interfere with cognitive function, such as Xanax, hydrocodone (although she does not take this that often) and takes quite a bit of nonnarcotic pain medications.  -I. will check lab work today including a CBC, chemistry panel, TSH, B12, folate  -I will repeat an MRI of the brain just to make sure that we are not missing anything.  I suspect that we are going to see a progression of atrophy.  -I. will refer her to Dr. Conley Canal for her expertise.  -She is not to be driving.  She is not to be left alone.  Safety in the home was discussed. 3.  I will see the patient back after the above has been completed.  She will be transitioning care to her new primary care physician, as Dr. Arnoldo Morale has left his current position, so e-mailed her new physician as well.

## 2014-05-23 LAB — COMPREHENSIVE METABOLIC PANEL
ALBUMIN: 3.9 g/dL (ref 3.5–5.2)
ALK PHOS: 63 U/L (ref 39–117)
ALT: 17 U/L (ref 0–35)
AST: 27 U/L (ref 0–37)
BUN: 16 mg/dL (ref 6–23)
CALCIUM: 10 mg/dL (ref 8.4–10.5)
CHLORIDE: 92 meq/L — AB (ref 96–112)
CO2: 25 mEq/L (ref 19–32)
Creat: 1.18 mg/dL — ABNORMAL HIGH (ref 0.50–1.10)
Glucose, Bld: 95 mg/dL (ref 70–99)
POTASSIUM: 4.5 meq/L (ref 3.5–5.3)
SODIUM: 126 meq/L — AB (ref 135–145)
TOTAL PROTEIN: 6.6 g/dL (ref 6.0–8.3)
Total Bilirubin: 0.3 mg/dL (ref 0.2–1.2)

## 2014-05-23 LAB — VITAMIN B12: VITAMIN B 12: 763 pg/mL (ref 211–911)

## 2014-05-23 LAB — TSH: TSH: 0.956 u[IU]/mL (ref 0.350–4.500)

## 2014-05-23 LAB — FOLATE: Folate: >20 ng/mL

## 2014-05-25 LAB — ZINC: ZINC: 51 ug/dL — AB (ref 60–130)

## 2014-05-25 LAB — ALCOHOL, METHYL (METHANOL), BLOOD: Methanol Lvl: NOT DETECTED mg/dL

## 2014-05-26 ENCOUNTER — Telehealth: Payer: Self-pay | Admitting: Neurology

## 2014-05-26 DIAGNOSIS — J309 Allergic rhinitis, unspecified: Secondary | ICD-10-CM | POA: Diagnosis not present

## 2014-05-26 LAB — VITAMIN B1: Vitamin B1 (Thiamine): 220 nmol/L — ABNORMAL HIGH (ref 8–30)

## 2014-05-26 NOTE — Telephone Encounter (Signed)
Spoke with husband and he was made aware of below message and appt scheduled with Dr [name] 05/27/14 at 2:45 pm. They will be at the apt and keep apt for transfer of care on Friday.

## 2014-05-26 NOTE — Telephone Encounter (Signed)
LMOM for patient to call back ASAP.

## 2014-05-26 NOTE — Telephone Encounter (Signed)
Message copied by Annamaria Helling on Tue May 26, 2014  3:13 PM ------      Message from: TAT, [place]      Created: Tue May 26, 2014  2:37 PM       Luvenia Starch, please let pts husband know that there are several labs that are off but the one that I am most concerned about is patients sodium.  I know that she is between PCP's because Dr. Arnoldo Morale left but Velora Heckler will still give her work in appointments and she needs to get one.  Can you call and see if she can get one today or at the latest tomorrow ------

## 2014-05-27 ENCOUNTER — Ambulatory Visit (INDEPENDENT_AMBULATORY_CARE_PROVIDER_SITE_OTHER): Payer: Medicare Other | Admitting: Family Medicine

## 2014-05-27 ENCOUNTER — Encounter: Payer: Self-pay | Admitting: Family Medicine

## 2014-05-27 VITALS — BP 104/68 | HR 82 | Temp 98.4°F | Wt 173.2 lb

## 2014-05-27 DIAGNOSIS — F3289 Other specified depressive episodes: Secondary | ICD-10-CM | POA: Diagnosis not present

## 2014-05-27 DIAGNOSIS — F329 Major depressive disorder, single episode, unspecified: Secondary | ICD-10-CM

## 2014-05-27 DIAGNOSIS — E871 Hypo-osmolality and hyponatremia: Secondary | ICD-10-CM | POA: Diagnosis not present

## 2014-05-27 DIAGNOSIS — R413 Other amnesia: Secondary | ICD-10-CM

## 2014-05-27 DIAGNOSIS — G219 Secondary parkinsonism, unspecified: Secondary | ICD-10-CM

## 2014-05-27 NOTE — Progress Notes (Signed)
Pre visit review using our clinic review tool, if applicable. No additional management support is needed unless otherwise documented below in the visit note.  Dementia, h/o etoh, low Na noted, on SSRI but not on TCA.  Med list to be clarified, verbally done with husband but he didn't bring a list of meds.   Memory changes started ~2000. H/o parkinsons sx on prev antidepressant.  Tremor improved now with prev med changes.  Less etoh recently, had been up to 3 a day.  Etoh had been tapered.  No h/o DTs.   Prev note from Dr. Carles Collet reviewed, re: low Na and prev etoh/tremor/memory changes.   Husband is in the midst of checking with senior resources.   PMH and SH reviewed  ROS: See HPI, otherwise noncontributory.  Meds, vitals, and allergies reviewed.   Nad, Not oriented to date. Pleasant in conversation ncat Mmm rrr ctab Abd soft, not ttp Ext with edema Faint intention tremor in L hand on testing but coordination o/w wnl on testing.  CN 2-12 wnl B, S/S/DTR wnl x4

## 2014-05-27 NOTE — Patient Instructions (Signed)
Taper off all alcohol over the next few days.  Go to the lab on the way out.  We'll contact you with your lab report. Keep the appointment on Friday.  Take care. We'll be in touch.

## 2014-05-28 ENCOUNTER — Encounter: Payer: Self-pay | Admitting: Family Medicine

## 2014-05-28 DIAGNOSIS — E871 Hypo-osmolality and hyponatremia: Secondary | ICD-10-CM | POA: Insufficient documentation

## 2014-05-28 LAB — BASIC METABOLIC PANEL
BUN: 24 mg/dL — AB (ref 6–23)
CO2: 25 meq/L (ref 19–32)
Calcium: 9.5 mg/dL (ref 8.4–10.5)
Chloride: 101 mEq/L (ref 96–112)
Creatinine, Ser: 1.4 mg/dL — ABNORMAL HIGH (ref 0.4–1.2)
GFR: 39.29 mL/min — ABNORMAL LOW (ref >60.00)
GLUCOSE: 95 mg/dL (ref 70–99)
POTASSIUM: 4.6 meq/L (ref 3.5–5.1)
Sodium: 134 mEq/L — ABNORMAL LOW (ref 135–145)

## 2014-05-28 LAB — SODIUM, URINE, RANDOM: Sodium, Ur: 53 mEq/L

## 2014-05-28 LAB — CREATININE, URINE, RANDOM: Creatinine, Urine: 156.9 mg/dL

## 2014-05-28 LAB — MAGNESIUM: Magnesium: 2.2 mg/dL (ref 1.5–2.5)

## 2014-05-28 NOTE — Assessment & Plan Note (Signed)
On SSRI and that could cause hyponatremia.  See notes on labs and low Na discussion.

## 2014-05-28 NOTE — Assessment & Plan Note (Signed)
With etoh hx, low Na, possible overlying dementia additionally.  See low Na w/u.

## 2014-05-28 NOTE — Assessment & Plan Note (Signed)
Chronic, worse on most recent labs, repeat pending from [facility].  Check urine Cr/Na today.  On SSRI and that could cause hyponatremia. Sig etoh hx, likely contributing. Likely not fluid overload or CHF causing the low Na.  Needs taper and then cessation of all etoh. D/w pt's husband.  See notes on labs.

## 2014-05-28 NOTE — Assessment & Plan Note (Signed)
Prev med induced, now with tremor improved.  Has seen neuro.

## 2014-05-29 ENCOUNTER — Ambulatory Visit (HOSPITAL_COMMUNITY)
Admission: RE | Admit: 2014-05-29 | Discharge: 2014-05-29 | Disposition: A | Discharge status: Home / Self Care | Payer: Medicare Other | Source: Ambulatory Visit | Attending: Neurology | Admitting: Neurology

## 2014-05-29 ENCOUNTER — Encounter: Payer: Self-pay | Admitting: Family Medicine

## 2014-05-29 ENCOUNTER — Ambulatory Visit (INDEPENDENT_AMBULATORY_CARE_PROVIDER_SITE_OTHER): Payer: Medicare Other | Admitting: Family Medicine

## 2014-05-29 VITALS — BP 102/64 | HR 98 | Temp 98.2°F | Ht 64.75 in | Wt 173.5 lb

## 2014-05-29 DIAGNOSIS — Z79890 Hormone replacement therapy (postmenopausal): Secondary | ICD-10-CM

## 2014-05-29 DIAGNOSIS — F29 Unspecified psychosis not due to a substance or known physiological condition: Secondary | ICD-10-CM | POA: Diagnosis not present

## 2014-05-29 DIAGNOSIS — I6789 Other cerebrovascular disease: Secondary | ICD-10-CM | POA: Insufficient documentation

## 2014-05-29 DIAGNOSIS — R4182 Altered mental status, unspecified: Secondary | ICD-10-CM | POA: Diagnosis not present

## 2014-05-29 DIAGNOSIS — R413 Other amnesia: Secondary | ICD-10-CM | POA: Insufficient documentation

## 2014-05-29 DIAGNOSIS — F1010 Alcohol abuse, uncomplicated: Secondary | ICD-10-CM

## 2014-05-29 DIAGNOSIS — G319 Degenerative disease of nervous system, unspecified: Secondary | ICD-10-CM | POA: Insufficient documentation

## 2014-05-29 DIAGNOSIS — F0390 Unspecified dementia without behavioral disturbance: Secondary | ICD-10-CM | POA: Diagnosis not present

## 2014-05-29 DIAGNOSIS — E871 Hypo-osmolality and hyponatremia: Secondary | ICD-10-CM

## 2014-05-29 DIAGNOSIS — G9340 Encephalopathy, unspecified: Secondary | ICD-10-CM

## 2014-05-29 DIAGNOSIS — N951 Menopausal and female climacteric states: Secondary | ICD-10-CM

## 2014-05-29 MED ORDER — ESTRADIOL 0.5 MG PO TABS
ORAL_TABLET | ORAL | Status: DC
Start: 2014-05-29 — End: 2014-08-03

## 2014-05-29 NOTE — Patient Instructions (Signed)
Take the estradiol and I'll await the MRI.  Take care.

## 2014-05-29 NOTE — Progress Notes (Signed)
Pre visit review using our clinic review tool, if applicable. No additional management support is needed unless otherwise documented below in the visit note.  Low Na prev noted, etoh was cut back and her values improved, d/w pt, husband and daughter.  No new mental status changes noted. Has f/u MRI per neuro pending.   Recent labs reviewed and discussed.   H/o HRT.  Long standing.  No hot flashes.  Difficult to get hx o/w re: recent sx due to dementia.  Discussed taper.   Meds, vitals, and allergies reviewed.   ROS: See HPI.  Otherwise, noncontributory.  Nad, pleasant but not oriented to time Mmm rrr ctab abd soft, not ttp Ext w/o edema CN 2-12 wnl B, S/S/DTR wnl x4 Faint intention tremor in L hand on testing but coordination o/w wnl on testing.

## 2014-05-31 DIAGNOSIS — Z79890 Hormone replacement therapy (postmenopausal): Secondary | ICD-10-CM | POA: Insufficient documentation

## 2014-05-31 NOTE — Assessment & Plan Note (Signed)
See etoh discussion.

## 2014-05-31 NOTE — Assessment & Plan Note (Signed)
Needs to cease.  D/w pt's family about distraction, disguising drinks so they won't contain etoh.  Likely exacerbated the prev low Na.

## 2014-05-31 NOTE — Assessment & Plan Note (Signed)
Will attempt taper over the next ~2 months.  >25 minutes spent in face to face time with patient, >50% spent in counselling or coordination of care.

## 2014-05-31 NOTE — Assessment & Plan Note (Signed)
Per neuro 

## 2014-06-01 ENCOUNTER — Telehealth: Payer: Self-pay | Admitting: Neurology

## 2014-06-01 NOTE — Telephone Encounter (Signed)
Patient's husband made aware. They will call with any questions.

## 2014-06-01 NOTE — Telephone Encounter (Signed)
Message copied by Annamaria Helling on Mon Jun 01, 2014  8:46 AM ------      Message from: TAT, [place]      Created: Mon Jun 01, 2014  7:47 AM       Reviewed.  Moderate atrophy.  At least mod small vessel disease.  Aimee Jones, you can let pts husband know that MRI brain shows nothing acute ------

## 2014-06-02 DIAGNOSIS — J309 Allergic rhinitis, unspecified: Secondary | ICD-10-CM | POA: Diagnosis not present

## 2014-06-05 DIAGNOSIS — M67919 Unspecified disorder of synovium and tendon, unspecified shoulder: Secondary | ICD-10-CM | POA: Diagnosis not present

## 2014-06-05 DIAGNOSIS — M19019 Primary osteoarthritis, unspecified shoulder: Secondary | ICD-10-CM | POA: Diagnosis not present

## 2014-06-05 DIAGNOSIS — Z4789 Encounter for other orthopedic aftercare: Secondary | ICD-10-CM | POA: Diagnosis not present

## 2014-06-09 ENCOUNTER — Other Ambulatory Visit: Payer: Self-pay

## 2014-06-09 DIAGNOSIS — J309 Allergic rhinitis, unspecified: Secondary | ICD-10-CM | POA: Diagnosis not present

## 2014-06-09 MED ORDER — HYDROCODONE-ACETAMINOPHEN 7.5-325 MG PO TABS
1.0000 | ORAL_TABLET | Freq: Three times a day (TID) | ORAL | Status: DC | PRN
Start: 2014-06-09 — End: 2014-06-17

## 2014-06-09 NOTE — Telephone Encounter (Signed)
Aimee Jones said shoulder surgery has failed; pt has appt with Dr Theda Sers, surgeon on 06/12/14. Pt is experiencing a lot of pain and Dr Theda Sers advised pts husband to get hydrocodone rx from PCP. Aimee Bigford requesting rx hydrocodone apap. Call when ready for pick up.

## 2014-06-09 NOTE — Telephone Encounter (Signed)
Husband advised.  Rx left at front desk for pick up.  

## 2014-06-09 NOTE — Telephone Encounter (Signed)
We need the notes from Dr. Theda Sers.  Rx printed. Given her troubles with etoh and memory loss, the goal is to get her on the least amount of opiates possible.  If no other good options exist from a surgical standpoint, then we may need to get her to a pain clinic for eval.

## 2014-06-12 DIAGNOSIS — M67919 Unspecified disorder of synovium and tendon, unspecified shoulder: Secondary | ICD-10-CM | POA: Diagnosis not present

## 2014-06-12 DIAGNOSIS — M719 Bursopathy, unspecified: Secondary | ICD-10-CM | POA: Diagnosis not present

## 2014-06-12 DIAGNOSIS — M19019 Primary osteoarthritis, unspecified shoulder: Secondary | ICD-10-CM | POA: Diagnosis not present

## 2014-06-16 DIAGNOSIS — J309 Allergic rhinitis, unspecified: Secondary | ICD-10-CM | POA: Diagnosis not present

## 2014-06-17 ENCOUNTER — Telehealth: Payer: Self-pay | Admitting: Family Medicine

## 2014-06-17 MED ORDER — HYDROCODONE-ACETAMINOPHEN 7.5-325 MG PO TABS: 1.0000 | ORAL_TABLET | Freq: Three times a day (TID) | ORAL | Status: DC | PRN

## 2014-06-17 NOTE — Telephone Encounter (Signed)
Printed.  Thanks.  

## 2014-06-17 NOTE — Telephone Encounter (Signed)
Pt's husband called and requested refill on Norco.  Last filled on 06/09/14 #30.  He said that pt is taking it q8h and will not have enough to last her through the weekend.  She has an appt to see orthopedic surgeon on 9/21 and is expected to have a reverse shoulder replacement after that.  He is requesting enough medication to get her through to that appt.

## 2014-06-17 NOTE — Telephone Encounter (Signed)
Patient's husband notified that script is up front ready for pickup.

## 2014-06-19 ENCOUNTER — Ambulatory Visit (INDEPENDENT_AMBULATORY_CARE_PROVIDER_SITE_OTHER): Payer: Medicare Other | Admitting: Family Medicine

## 2014-06-19 ENCOUNTER — Encounter: Payer: Self-pay | Admitting: Family Medicine

## 2014-06-19 VITALS — BP 110/60 | HR 85 | Temp 98.2°F | Wt 171.4 lb

## 2014-06-19 DIAGNOSIS — R05 Cough: Secondary | ICD-10-CM | POA: Diagnosis not present

## 2014-06-19 DIAGNOSIS — R059 Cough, unspecified: Secondary | ICD-10-CM | POA: Diagnosis not present

## 2014-06-19 MED ORDER — AZITHROMYCIN 250 MG PO TABS: ORAL_TABLET | ORAL | Status: DC

## 2014-06-19 NOTE — Progress Notes (Signed)
Pre visit review using our clinic review tool, if applicable. No additional management support is needed unless otherwise documented below in the visit note.  HA for the last few weeks.  Unclear source.  No vision changes.  Unclear if related to antihistamine use.  Not constant.   More recently with cough and sputum production, about 1 week ago.  Progressive cough.  No fevers.  No ear pain.  Some rhinorrhea (likely from allergies), no ST.  No vomiting, no diarrhea.  No rash.    Meds, vitals, and allergies reviewed.   ROS: See HPI.  Otherwise, noncontributory.  R arm in a sling at OV.  (Has f/u with ortho pending) B lower lids seem puffier than prev.   Nasal exam stuffy OP with mild cobblestoning Neck supple, no LA rrr ctab abd soft

## 2014-06-19 NOTE — Patient Instructions (Signed)
Hold off on the antibiotics for two more days.  If not better and still with a productive cough, then start zithromax. If you start it, then skip the citalopram. Take care.  Glad to see you.

## 2014-06-22 NOTE — Assessment & Plan Note (Signed)
Likely either viral or from post nasal gtt.  Nontoxic.  Continue allergra, if not improved soon then start zmax.  Would hold abx for now.  D/w pt and husband.  They agree, f/u prn.

## 2014-06-23 DIAGNOSIS — J309 Allergic rhinitis, unspecified: Secondary | ICD-10-CM | POA: Diagnosis not present

## 2014-06-23 DIAGNOSIS — Z4789 Encounter for other orthopedic aftercare: Secondary | ICD-10-CM | POA: Diagnosis not present

## 2014-06-23 DIAGNOSIS — M67919 Unspecified disorder of synovium and tendon, unspecified shoulder: Secondary | ICD-10-CM | POA: Diagnosis not present

## 2014-06-23 DIAGNOSIS — M19019 Primary osteoarthritis, unspecified shoulder: Secondary | ICD-10-CM | POA: Diagnosis not present

## 2014-06-29 DIAGNOSIS — H04569 Stenosis of unspecified lacrimal punctum: Secondary | ICD-10-CM | POA: Diagnosis not present

## 2014-06-29 DIAGNOSIS — H02539 Eyelid retraction unspecified eye, unspecified lid: Secondary | ICD-10-CM | POA: Diagnosis not present

## 2014-06-29 DIAGNOSIS — M2420 Disorder of ligament, unspecified site: Secondary | ICD-10-CM | POA: Diagnosis not present

## 2014-06-29 DIAGNOSIS — H11439 Conjunctival hyperemia, unspecified eye: Secondary | ICD-10-CM | POA: Diagnosis not present

## 2014-06-29 DIAGNOSIS — H02849 Edema of unspecified eye, unspecified eyelid: Secondary | ICD-10-CM | POA: Diagnosis not present

## 2014-06-30 DIAGNOSIS — J309 Allergic rhinitis, unspecified: Secondary | ICD-10-CM | POA: Diagnosis not present

## 2014-07-06 ENCOUNTER — Other Ambulatory Visit: Payer: Self-pay | Admitting: *Deleted

## 2014-07-06 ENCOUNTER — Ambulatory Visit: Payer: Medicare Other | Admitting: Internal Medicine

## 2014-07-06 MED ORDER — HYDROCODONE-ACETAMINOPHEN 5-325 MG PO TABS: 1.0000 | ORAL_TABLET | Freq: Three times a day (TID) | ORAL | Status: DC | PRN

## 2014-07-06 NOTE — Telephone Encounter (Signed)
They were going to ortho today.  What was the outcome of that?  Let me know.  Thanks.

## 2014-07-06 NOTE — Telephone Encounter (Signed)
The last rx was only to allow her to have time for f/u with ortho.   I need input from ortho.   Per husband's prev report, she was planning on OV today and possible surgery after that.   I have nothing recent from ortho. I can't fill this w/o a solid plan.  This is the last fill on this w/o some records and definitive plan.   The goal is to limit her opiates given her h/o etoh and dementia.  My plan is to begin tapering her dose, eventually to get her off opiates.

## 2014-07-06 NOTE — Telephone Encounter (Signed)
Noted, thanks!

## 2014-07-06 NOTE — Telephone Encounter (Signed)
Husband left voicemail requesting Hydrocodone refill, please call when ready to pick up. Pt's last ov was an acute on 06/19/14.

## 2014-07-06 NOTE — Telephone Encounter (Signed)
Spoke to patient and her husband and was advised that she went to see ortho couple of weeks ago and had an injection in her shoulder. Patient stated that she has a follow-up appointment 07/22/14. Patient's husband stated that the injection has helped some, but still needs to take the pain medication also. Patient was told by the ortho that Dr. [name] should manage her pain medication.

## 2014-07-06 NOTE — Telephone Encounter (Signed)
Patient's husband notified as instructed by telephone. Advised Mr. Adcox that script is up front ready for pickup. Mr. Moret stated that he dropped off the notes at the front office from the ortho the day after her appointment for Dr. Josefine Class records. Mr. Sakurai said that he will bring another copy Tuesday when he comes to pick the script up.

## 2014-07-07 DIAGNOSIS — J309 Allergic rhinitis, unspecified: Secondary | ICD-10-CM | POA: Diagnosis not present

## 2014-07-10 ENCOUNTER — Telehealth: Payer: Self-pay | Admitting: Family Medicine

## 2014-07-10 NOTE — Telephone Encounter (Signed)
Ortho note reviewed from 06/23/14.  Shoulder was injected.  They plan to see her back about 1 months from that point.  I'll await the notes from that upcoming visit.  The ortho note list surgery as a possible option, depending on her pain level and surgical risk.

## 2014-07-11 DIAGNOSIS — Z23 Encounter for immunization: Secondary | ICD-10-CM | POA: Diagnosis not present

## 2014-07-14 DIAGNOSIS — J309 Allergic rhinitis, unspecified: Secondary | ICD-10-CM | POA: Diagnosis not present

## 2014-07-18 ENCOUNTER — Other Ambulatory Visit: Payer: Self-pay | Admitting: Internal Medicine

## 2014-07-18 DIAGNOSIS — E785 Hyperlipidemia, unspecified: Secondary | ICD-10-CM

## 2014-07-20 ENCOUNTER — Other Ambulatory Visit: Payer: Self-pay

## 2014-07-20 NOTE — Telephone Encounter (Signed)
pts husband request rx hydrocodone apap. Call when ready for pick up. Pt has appt with ortho surgeon, Dr Onnie Graham on 07/21/14 but pt has had to take pain med more often due to shoulder pain. Please advise.

## 2014-07-21 DIAGNOSIS — M75101 Unspecified rotator cuff tear or rupture of right shoulder, not specified as traumatic: Secondary | ICD-10-CM | POA: Diagnosis not present

## 2014-07-21 DIAGNOSIS — J3089 Other allergic rhinitis: Secondary | ICD-10-CM | POA: Diagnosis not present

## 2014-07-21 DIAGNOSIS — M25511 Pain in right shoulder: Secondary | ICD-10-CM | POA: Diagnosis not present

## 2014-07-21 DIAGNOSIS — J3081 Allergic rhinitis due to animal (cat) (dog) hair and dander: Secondary | ICD-10-CM | POA: Diagnosis not present

## 2014-07-21 DIAGNOSIS — M12511 Traumatic arthropathy, right shoulder: Secondary | ICD-10-CM | POA: Diagnosis not present

## 2014-07-21 DIAGNOSIS — M19011 Primary osteoarthritis, right shoulder: Secondary | ICD-10-CM | POA: Diagnosis not present

## 2014-07-21 MED ORDER — HYDROCODONE-ACETAMINOPHEN 5-325 MG PO TABS: 0.5000 | ORAL_TABLET | Freq: Three times a day (TID) | ORAL | Status: DC | PRN

## 2014-07-21 NOTE — Telephone Encounter (Signed)
Patient's husband notified as instructed by telephone. Rx left up front for pickup. Was advised that they have just left the ortho appointment and plans on dropping off notes for Dr. [name] from that visit.

## 2014-07-21 NOTE — Telephone Encounter (Signed)
This is an early refill.  It shouldn't be filled until 07/24/14.  My plan is not to leave her on opiates long term, and to taper her dose down in the meantime.  Rx done for 20 day supply.  I'll await ortho notes.

## 2014-07-21 NOTE — Telephone Encounter (Signed)
Sent.  She needs lipids panel with next set of labs.  Order is in.

## 2014-07-23 ENCOUNTER — Telehealth: Payer: Self-pay

## 2014-07-23 NOTE — Telephone Encounter (Signed)
Spoke to Aimee Jones per Dr. Josefine Class request. Mr. Aimee Jones stated that he is wanting for [facility] to call with the surgery date. I asked patient to call the office as soon as he has a date scheduled to notify Dr. [name]. Aimee Jones verbalized understanding.

## 2014-07-23 NOTE — Telephone Encounter (Signed)
Pt left v/m; pt wants to know when she can have something done about shoulder pain. Pt wants our office to find out when she is to have surgery. Left v/m for pt to cb.

## 2014-07-23 NOTE — Telephone Encounter (Signed)
Thanks

## 2014-07-24 ENCOUNTER — Other Ambulatory Visit: Payer: Self-pay

## 2014-07-24 NOTE — Telephone Encounter (Signed)
Error

## 2014-07-28 DIAGNOSIS — J3089 Other allergic rhinitis: Secondary | ICD-10-CM | POA: Diagnosis not present

## 2014-07-28 DIAGNOSIS — J3081 Allergic rhinitis due to animal (cat) (dog) hair and dander: Secondary | ICD-10-CM | POA: Diagnosis not present

## 2014-07-29 NOTE — Telephone Encounter (Signed)
See 07/23/14 phone note.

## 2014-07-30 NOTE — Telephone Encounter (Signed)
Noted, thanks!

## 2014-07-30 NOTE — Telephone Encounter (Signed)
Aimee Jones left v/m; pt has shoulder surgery scheduled for 08/13/14.

## 2014-08-03 ENCOUNTER — Encounter (HOSPITAL_COMMUNITY): Payer: Self-pay | Admitting: Pharmacy Technician

## 2014-08-04 DIAGNOSIS — J3089 Other allergic rhinitis: Secondary | ICD-10-CM | POA: Diagnosis not present

## 2014-08-04 DIAGNOSIS — J3081 Allergic rhinitis due to animal (cat) (dog) hair and dander: Secondary | ICD-10-CM | POA: Diagnosis not present

## 2014-08-05 ENCOUNTER — Inpatient Hospital Stay (HOSPITAL_COMMUNITY): Admission: RE | Admit: 2014-08-05 | Payer: Medicare Other | Source: Ambulatory Visit

## 2014-08-07 ENCOUNTER — Encounter (HOSPITAL_COMMUNITY): Payer: Self-pay

## 2014-08-07 ENCOUNTER — Inpatient Hospital Stay (HOSPITAL_COMMUNITY)
Admission: RE | Admit: 2014-08-07 | Discharge: 2014-08-07 | Disposition: A | Discharge status: Home / Self Care | Payer: Medicare Other | Source: Ambulatory Visit | Attending: Orthopedic Surgery | Admitting: Orthopedic Surgery

## 2014-08-07 DIAGNOSIS — Z90710 Acquired absence of both cervix and uterus: Secondary | ICD-10-CM | POA: Diagnosis not present

## 2014-08-07 DIAGNOSIS — G8918 Other acute postprocedural pain: Secondary | ICD-10-CM | POA: Diagnosis not present

## 2014-08-07 DIAGNOSIS — J45909 Unspecified asthma, uncomplicated: Secondary | ICD-10-CM | POA: Diagnosis present

## 2014-08-07 DIAGNOSIS — M25511 Pain in right shoulder: Secondary | ICD-10-CM | POA: Diagnosis not present

## 2014-08-07 DIAGNOSIS — M75101 Unspecified rotator cuff tear or rupture of right shoulder, not specified as traumatic: Secondary | ICD-10-CM | POA: Diagnosis present

## 2014-08-07 DIAGNOSIS — I10 Essential (primary) hypertension: Secondary | ICD-10-CM | POA: Diagnosis present

## 2014-08-07 DIAGNOSIS — Z96643 Presence of artificial hip joint, bilateral: Secondary | ICD-10-CM | POA: Diagnosis present

## 2014-08-07 DIAGNOSIS — Z9842 Cataract extraction status, left eye: Secondary | ICD-10-CM | POA: Diagnosis not present

## 2014-08-07 DIAGNOSIS — Z79891 Long term (current) use of opiate analgesic: Secondary | ICD-10-CM | POA: Diagnosis not present

## 2014-08-07 DIAGNOSIS — M129 Arthropathy, unspecified: Secondary | ICD-10-CM | POA: Diagnosis not present

## 2014-08-07 DIAGNOSIS — Z880 Allergy status to penicillin: Secondary | ICD-10-CM | POA: Diagnosis not present

## 2014-08-07 DIAGNOSIS — J449 Chronic obstructive pulmonary disease, unspecified: Secondary | ICD-10-CM | POA: Diagnosis present

## 2014-08-07 DIAGNOSIS — Z7982 Long term (current) use of aspirin: Secondary | ICD-10-CM | POA: Diagnosis not present

## 2014-08-07 DIAGNOSIS — Z9841 Cataract extraction status, right eye: Secondary | ICD-10-CM | POA: Diagnosis not present

## 2014-08-07 DIAGNOSIS — Z79899 Other long term (current) drug therapy: Secondary | ICD-10-CM | POA: Diagnosis not present

## 2014-08-07 HISTORY — DX: Anxiety disorder, unspecified: F41.9

## 2014-08-07 HISTORY — DX: Shortness of breath: R06.02

## 2014-08-07 LAB — CBC WITH DIFFERENTIAL/PLATELET
Basophils Absolute: 0.1 10*3/uL (ref 0.0–0.1)
Basophils Relative: 1 % (ref 0–1)
EOS PCT: 7 % — AB (ref 0–5)
Eosinophils Absolute: 0.7 10*3/uL (ref 0.0–0.7)
HEMATOCRIT: 32.8 % — AB (ref 36.0–46.0)
HEMOGLOBIN: 10.5 g/dL — AB (ref 12.0–15.0)
LYMPHS PCT: 32 % (ref 12–46)
Lymphs Abs: 3.3 10*3/uL (ref 0.7–4.0)
MCH: 26.5 pg (ref 26.0–34.0)
MCHC: 32 g/dL (ref 30.0–36.0)
MCV: 82.8 fL (ref 78.0–100.0)
MONO ABS: 0.7 10*3/uL (ref 0.1–1.0)
MONOS PCT: 7 % (ref 3–12)
NEUTROS ABS: 5.6 10*3/uL (ref 1.7–7.7)
Neutrophils Relative %: 53 % (ref 43–77)
Platelets: 497 10*3/uL — ABNORMAL HIGH (ref 150–400)
RBC: 3.96 MIL/uL (ref 3.87–5.11)
RDW: 14.7 % (ref 11.5–15.5)
WBC: 10.4 10*3/uL (ref 4.0–10.5)

## 2014-08-07 LAB — COMPREHENSIVE METABOLIC PANEL
ALT: 19 U/L (ref 0–35)
ANION GAP: 11 (ref 5–15)
AST: 22 U/L (ref 0–37)
Albumin: 3.4 g/dL — ABNORMAL LOW (ref 3.5–5.2)
Alkaline Phosphatase: 69 U/L (ref 39–117)
BUN: 29 mg/dL — AB (ref 6–23)
CHLORIDE: 103 meq/L (ref 96–112)
CO2: 23 meq/L (ref 19–32)
Calcium: 9.6 mg/dL (ref 8.4–10.5)
Creatinine, Ser: 1.45 mg/dL — ABNORMAL HIGH (ref 0.50–1.10)
GFR, EST AFRICAN AMERICAN: 42 mL/min — AB (ref >90)
GFR, EST NON AFRICAN AMERICAN: 36 mL/min — AB (ref >90)
Glucose, Bld: 93 mg/dL (ref 70–99)
Potassium: 4.5 mEq/L (ref 3.7–5.3)
Sodium: 137 mEq/L (ref 137–147)
Total Protein: 7 g/dL (ref 6.0–8.3)

## 2014-08-07 LAB — APTT: aPTT: 30 seconds (ref 24–37)

## 2014-08-07 LAB — PROTIME-INR
INR: 1.04 (ref 0.00–1.49)
Prothrombin Time: 13.7 seconds (ref 11.6–15.2)

## 2014-08-07 NOTE — Pre-Procedure Instructions (Signed)
Aimee Jones  08/07/2014   Your procedure is scheduled on: Thursday, August 13, 2014  Report to [facility] Admitting at 5:30 AM.  Call this number if you have problems the morning of surgery: [phone]   Remember:   Do not eat food or drink liquids after midnight Wednesday, August 12, 2014   Take these medicines the morning of surgery with A SIP OF WATER: citalopram (CELEXA), fexofenadine (ALLEGRA), fluticasone (FLONASE), if needed:budesonide-formoterol (SYMBICORT) for.shortness of breath, HYDROcodone ( NORCO/VICODIN) or pain  Stop taking Aspirin, vitamins, and herbal medications ( fish oil-omega-3) Do not take any NSAIDs ie: Ibuprofen, Advil, Naproxen ( Aleve) or any medication containing Aspirin such as  nabumetone (RELAFEN).    Do not wear jewelry, make-up or nail polish.  Do not wear lotions, powders, or perfumes. You may not wear deodorant.  Do not shave 48 hours prior to surgery.  Do not bring valuables to the hospital.  [facility] is not responsible for any belongings or valuables.               Contacts, dentures or bridgework may not be worn into surgery.  Leave suitcase in the car. After surgery it may be brought to your room.  For patients admitted to the hospital, discharge time is determined by your treatment team.               Patients discharged the day of surgery will not be allowed to drive home.  Name and phone number of your driver: with Mr. Pohlman  Special Instructions:  Special Instructions:Special Instructions: [facility] - Preparing for Surgery  Before surgery, you can play an important role.  Because skin is not sterile, your skin needs to be as free of germs as possible.  You can reduce the number of germs on you skin by washing with CHG (chlorahexidine gluconate) soap before surgery.  CHG is an antiseptic cleaner which kills germs and bonds with the skin to continue killing germs even after washing.  Please DO NOT use if you have an  allergy to CHG or antibacterial soaps.  If your skin becomes reddened/irritated stop using the CHG and inform your nurse when you arrive at Short Stay.  Do not shave (including legs and underarms) for at least 48 hours prior to the first CHG shower.  You may shave your face.  Please follow these instructions carefully:   1.  Shower with CHG Soap the night before surgery and the morning of Surgery.  2.  If you choose to wash your hair, wash your hair first as usual with your normal shampoo.  3.  After you shampoo, rinse your hair and body thoroughly to remove the Shampoo.  4.  Use CHG as you would any other liquid soap.  You can apply chg directly  to the skin and wash gently with scrungie or a clean washcloth.  5.  Apply the CHG Soap to your body ONLY FROM THE NECK DOWN.  Do not use on open wounds or open sores.  Avoid contact with your eyes, ears, mouth and genitals (private parts).  Wash genitals (private parts) with your normal soap.  6.  Wash thoroughly, paying special attention to the area where your surgery will be performed.  7.  Thoroughly rinse your body with warm water from the neck down.  8.  DO NOT shower/wash with your normal soap after using and rinsing off the CHG Soap.  9.  Pat yourself dry with a clean  towel.            10.  Wear clean pajamas.            11.  Place clean sheets on your bed the night of your first shower and do not sleep with pets.  Day of Surgery  Do not apply any lotions/deodorants the morning of surgery.  Please wear clean clothes to the hospital/surgery center.   Please read over the following fact sheets that you were given: Pain Booklet, Coughing and Deep Breathing and Surgical Site Infection Prevention

## 2014-08-07 NOTE — Progress Notes (Signed)
Followed by Dr. [name] at [facility], no cardiac needs other than an anti-htn med.

## 2014-08-10 ENCOUNTER — Other Ambulatory Visit: Payer: Self-pay

## 2014-08-10 MED ORDER — HYDROCODONE-ACETAMINOPHEN 5-325 MG PO TABS: 0.5000 | ORAL_TABLET | Freq: Three times a day (TID) | ORAL | Status: DC | PRN

## 2014-08-10 NOTE — Progress Notes (Signed)
Anesthesia Chart Review:  Pt is 68 year old female scheduled for R reverse shoulder arthroplasty on 08/13/2014 with Dr. Onnie Graham.   PMH: HTN, hyperlipidemia, Etoh abuse, dementia, asthma  Medications include: ASA, olmesartan/hctz, lipitor, aricept, celexa, xanax  Preoperative labs reviewed.  BUN/Cr 29/1.45. Hgb/Hct 10.5/32.8.   EKG: Normal sinus rhythm Low voltage QRS Poor R wave progression Cannot rule out Anteroseptal infarct , age undetermined No significant change since last tracing 10/19/2009.   PCP is [name], notes in [place]. He is aware of her upcoming surgery.   If no changes, I anticipate pt can proceed with surgery as scheduled.   Willeen Cass, FNP-BC [facility] Short Stay Surgical Center/Anesthesiology Phone: [phone] 08/10/2014 3:33 PM

## 2014-08-10 NOTE — Telephone Encounter (Signed)
Aimee Jones left v/m requesting rx hydrocodone apap. Call when ready for pick up. pts shoulder surgery is scheduled for 08/13/14. Pt only has 2 pills left.Please advise.

## 2014-08-10 NOTE — Telephone Encounter (Signed)
Patient's husband notified by telephone that script is up front ready for pickup.

## 2014-08-10 NOTE — Telephone Encounter (Signed)
Printed.  Thanks.  

## 2014-08-11 DIAGNOSIS — H524 Presbyopia: Secondary | ICD-10-CM | POA: Diagnosis not present

## 2014-08-11 DIAGNOSIS — H3531 Nonexudative age-related macular degeneration: Secondary | ICD-10-CM | POA: Diagnosis not present

## 2014-08-11 DIAGNOSIS — H1010 Acute atopic conjunctivitis, unspecified eye: Secondary | ICD-10-CM | POA: Diagnosis not present

## 2014-08-11 DIAGNOSIS — H40013 Open angle with borderline findings, low risk, bilateral: Secondary | ICD-10-CM | POA: Diagnosis not present

## 2014-08-11 DIAGNOSIS — J3089 Other allergic rhinitis: Secondary | ICD-10-CM | POA: Diagnosis not present

## 2014-08-11 DIAGNOSIS — J3081 Allergic rhinitis due to animal (cat) (dog) hair and dander: Secondary | ICD-10-CM | POA: Diagnosis not present

## 2014-08-12 ENCOUNTER — Telehealth: Payer: Self-pay | Admitting: Family Medicine

## 2014-08-12 ENCOUNTER — Encounter: Payer: Self-pay | Admitting: *Deleted

## 2014-08-12 ENCOUNTER — Telehealth: Payer: Self-pay

## 2014-08-12 MED ORDER — CEFAZOLIN SODIUM-DEXTROSE 2-3 GM-% IV SOLR
2.0000 g | INTRAVENOUS | Status: AC
  Administered 2014-08-13: 2 g via INTRAVENOUS
  Filled 2014-08-12: qty 50

## 2014-08-12 MED ORDER — LACTATED RINGERS IV SOLN: INTRAVENOUS | Status: DC

## 2014-08-12 MED ORDER — BUDESONIDE-FORMOTEROL FUMARATE 160-4.5 MCG/ACT IN AERO: 2.0000 | INHALATION_SPRAY | Freq: Two times a day (BID) | RESPIRATORY_TRACT | Status: DC | PRN

## 2014-08-12 MED ORDER — CHLORHEXIDINE GLUCONATE 4 % EX LIQD
60.0000 mL | Freq: Once | CUTANEOUS | Status: DC
  Filled 2014-08-12: qty 60

## 2014-08-12 NOTE — Telephone Encounter (Signed)
Patient's husband notified as instructed by telephone. Patient's husband stated that he purchased a Sales executive from [employer] because he knows that she will not be able to use the lever on their recliner after her shoulder surgery.  Mr. Straub wants to know if you will write a prescription so he can see if Medicare will help pay for it?

## 2014-08-12 NOTE — Telephone Encounter (Signed)
Please print a letter for [employer] automatic electric recliner.  She can't use a regular recline due to shoulder pain and pending shoulder surgery.  Thanks.

## 2014-08-12 NOTE — Telephone Encounter (Signed)
Sent, has been on prev.

## 2014-08-12 NOTE — Telephone Encounter (Signed)
Letter is on your desk for signature. 

## 2014-08-12 NOTE — Telephone Encounter (Signed)
I'll sign it when I get to clinic.  Thanks.

## 2014-08-12 NOTE — Telephone Encounter (Signed)
Patient notified that letter is up front ready for pickup.

## 2014-08-12 NOTE — Telephone Encounter (Signed)
pt left note requesting refill symbicort to [facility]; do not see on med list.Please advise.

## 2014-08-12 NOTE — Telephone Encounter (Signed)
Please call patient/husband.  She has surgery scheduled tomorrow.  Please tell them I wish them the best with the surgery.  Thanks.

## 2014-08-13 ENCOUNTER — Encounter (HOSPITAL_COMMUNITY): Payer: Medicare Other | Admitting: Emergency Medicine

## 2014-08-13 ENCOUNTER — Inpatient Hospital Stay (HOSPITAL_COMMUNITY)
Admission: RE | Admit: 2014-08-13 | Discharge: 2014-08-14 | DRG: 483 | Disposition: A | Discharge status: Home Health | Payer: Medicare Other | Source: Ambulatory Visit | Attending: Orthopedic Surgery | Admitting: Orthopedic Surgery

## 2014-08-13 ENCOUNTER — Encounter (HOSPITAL_COMMUNITY)
Admission: RE | Disposition: A | Discharge status: Home Health | Payer: Self-pay | Source: Ambulatory Visit | Attending: Orthopedic Surgery

## 2014-08-13 ENCOUNTER — Inpatient Hospital Stay (HOSPITAL_COMMUNITY): Payer: Medicare Other | Admitting: Emergency Medicine

## 2014-08-13 ENCOUNTER — Encounter (HOSPITAL_COMMUNITY): Payer: Self-pay | Admitting: *Deleted

## 2014-08-13 DIAGNOSIS — Z79891 Long term (current) use of opiate analgesic: Secondary | ICD-10-CM | POA: Diagnosis not present

## 2014-08-13 DIAGNOSIS — Z9841 Cataract extraction status, right eye: Secondary | ICD-10-CM

## 2014-08-13 DIAGNOSIS — M25511 Pain in right shoulder: Secondary | ICD-10-CM | POA: Diagnosis present

## 2014-08-13 DIAGNOSIS — Z90710 Acquired absence of both cervix and uterus: Secondary | ICD-10-CM

## 2014-08-13 DIAGNOSIS — Z96643 Presence of artificial hip joint, bilateral: Secondary | ICD-10-CM | POA: Diagnosis present

## 2014-08-13 DIAGNOSIS — M75101 Unspecified rotator cuff tear or rupture of right shoulder, not specified as traumatic: Secondary | ICD-10-CM | POA: Diagnosis not present

## 2014-08-13 DIAGNOSIS — Z9842 Cataract extraction status, left eye: Secondary | ICD-10-CM | POA: Diagnosis not present

## 2014-08-13 DIAGNOSIS — Z79899 Other long term (current) drug therapy: Secondary | ICD-10-CM

## 2014-08-13 DIAGNOSIS — J45909 Unspecified asthma, uncomplicated: Secondary | ICD-10-CM | POA: Diagnosis present

## 2014-08-13 DIAGNOSIS — Z880 Allergy status to penicillin: Secondary | ICD-10-CM | POA: Diagnosis not present

## 2014-08-13 DIAGNOSIS — Z7982 Long term (current) use of aspirin: Secondary | ICD-10-CM | POA: Diagnosis not present

## 2014-08-13 DIAGNOSIS — J449 Chronic obstructive pulmonary disease, unspecified: Secondary | ICD-10-CM | POA: Diagnosis not present

## 2014-08-13 DIAGNOSIS — G8918 Other acute postprocedural pain: Secondary | ICD-10-CM | POA: Diagnosis not present

## 2014-08-13 DIAGNOSIS — I10 Essential (primary) hypertension: Secondary | ICD-10-CM | POA: Diagnosis present

## 2014-08-13 DIAGNOSIS — M129 Arthropathy, unspecified: Secondary | ICD-10-CM | POA: Diagnosis not present

## 2014-08-13 DIAGNOSIS — M12811 Other specific arthropathies, not elsewhere classified, right shoulder: Secondary | ICD-10-CM | POA: Diagnosis present

## 2014-08-13 DIAGNOSIS — Z96619 Presence of unspecified artificial shoulder joint: Secondary | ICD-10-CM

## 2014-08-13 HISTORY — PX: REVERSE SHOULDER ARTHROPLASTY: SHX5054

## 2014-08-13 SURGERY — ARTHROPLASTY, SHOULDER, TOTAL, REVERSE
Anesthesia: Regional | Site: Shoulder | Laterality: Right

## 2014-08-13 MED ORDER — PHENYLEPHRINE 40 MCG/ML (10ML) SYRINGE FOR IV PUSH (FOR BLOOD PRESSURE SUPPORT)
PREFILLED_SYRINGE | INTRAVENOUS | Status: AC
  Filled 2014-08-13: qty 20

## 2014-08-13 MED ORDER — LACTATED RINGERS IV SOLN
INTRAVENOUS | Status: DC | PRN
  Administered 2014-08-13: 07:00:00 via INTRAVENOUS

## 2014-08-13 MED ORDER — CITALOPRAM HYDROBROMIDE 40 MG PO TABS
40.0000 mg | ORAL_TABLET | Freq: Every day | ORAL | Status: DC
  Administered 2014-08-13: 40 mg via ORAL
  Filled 2014-08-13 (×2): qty 1

## 2014-08-13 MED ORDER — BUPIVACAINE-EPINEPHRINE (PF) 0.25% -1:200000 IJ SOLN
INTRAMUSCULAR | Status: AC
  Filled 2014-08-13: qty 30

## 2014-08-13 MED ORDER — PROPOFOL 10 MG/ML IV BOLUS
INTRAVENOUS | Status: DC | PRN
  Administered 2014-08-13: 150 mg via INTRAVENOUS

## 2014-08-13 MED ORDER — HYDROCHLOROTHIAZIDE 25 MG PO TABS
25.0000 mg | ORAL_TABLET | Freq: Every day | ORAL | Status: DC
  Administered 2014-08-13: 25 mg via ORAL
  Filled 2014-08-13 (×2): qty 1

## 2014-08-13 MED ORDER — LORATADINE 10 MG PO TABS
10.0000 mg | ORAL_TABLET | Freq: Every day | ORAL | Status: DC
  Administered 2014-08-14: 10 mg via ORAL
  Filled 2014-08-13 (×2): qty 1

## 2014-08-13 MED ORDER — ONDANSETRON HCL 4 MG PO TABS: 4.0000 mg | ORAL_TABLET | Freq: Four times a day (QID) | ORAL | Status: DC | PRN

## 2014-08-13 MED ORDER — PANTOPRAZOLE SODIUM 40 MG PO TBEC
40.0000 mg | DELAYED_RELEASE_TABLET | Freq: Every day | ORAL | Status: DC
  Administered 2014-08-13 – 2014-08-14 (×2): 40 mg via ORAL
  Filled 2014-08-13 (×2): qty 1

## 2014-08-13 MED ORDER — PNEUMOCOCCAL VAC POLYVALENT 25 MCG/0.5ML IJ INJ
0.5000 mL | INJECTION | INTRAMUSCULAR | Status: AC
  Administered 2014-08-14: 0.5 mL via INTRAMUSCULAR
  Filled 2014-08-13 (×2): qty 0.5

## 2014-08-13 MED ORDER — EPHEDRINE SULFATE 50 MG/ML IJ SOLN
INTRAMUSCULAR | Status: DC | PRN
  Administered 2014-08-13 (×2): 5 mg via INTRAVENOUS

## 2014-08-13 MED ORDER — ACETAMINOPHEN 325 MG PO TABS
650.0000 mg | ORAL_TABLET | Freq: Four times a day (QID) | ORAL | Status: DC | PRN
  Administered 2014-08-13: 650 mg via ORAL
  Filled 2014-08-13 (×2): qty 2

## 2014-08-13 MED ORDER — DONEPEZIL HCL 10 MG PO TABS
10.0000 mg | ORAL_TABLET | Freq: Every day | ORAL | Status: DC
  Administered 2014-08-14: 10 mg via ORAL
  Filled 2014-08-13 (×2): qty 1

## 2014-08-13 MED ORDER — ACETAMINOPHEN 650 MG RE SUPP: 650.0000 mg | Freq: Four times a day (QID) | RECTAL | Status: DC | PRN

## 2014-08-13 MED ORDER — DIPHENHYDRAMINE HCL 50 MG/ML IJ SOLN
INTRAMUSCULAR | Status: DC | PRN
  Administered 2014-08-13: 8 mg via INTRAVENOUS

## 2014-08-13 MED ORDER — NEOSTIGMINE METHYLSULFATE 10 MG/10ML IV SOLN
INTRAVENOUS | Status: AC
  Filled 2014-08-13: qty 1

## 2014-08-13 MED ORDER — DEXAMETHASONE SODIUM PHOSPHATE 10 MG/ML IJ SOLN
INTRAMUSCULAR | Status: AC
  Filled 2014-08-13: qty 1

## 2014-08-13 MED ORDER — PHENYLEPHRINE HCL 10 MG/ML IJ SOLN
INTRAMUSCULAR | Status: DC | PRN
  Administered 2014-08-13: 40 ug via INTRAVENOUS
  Administered 2014-08-13 (×2): 80 ug via INTRAVENOUS
  Administered 2014-08-13 (×3): 40 ug via INTRAVENOUS
  Administered 2014-08-13: 80 ug via INTRAVENOUS

## 2014-08-13 MED ORDER — PHENOL 1.4 % MT LIQD
1.0000 | OROMUCOSAL | Status: DC | PRN
  Administered 2014-08-13: 1 via OROMUCOSAL
  Filled 2014-08-13: qty 177

## 2014-08-13 MED ORDER — DIPHENHYDRAMINE HCL 50 MG/ML IJ SOLN: 10.0000 mg | Freq: Once | INTRAMUSCULAR | Status: DC

## 2014-08-13 MED ORDER — ROCURONIUM BROMIDE 100 MG/10ML IV SOLN
INTRAVENOUS | Status: DC | PRN
  Administered 2014-08-13: 40 mg via INTRAVENOUS

## 2014-08-13 MED ORDER — DEXAMETHASONE SODIUM PHOSPHATE 10 MG/ML IJ SOLN
INTRAMUSCULAR | Status: DC | PRN
  Administered 2014-08-13: 10 mg via INTRAVENOUS

## 2014-08-13 MED ORDER — HYDROMORPHONE HCL 1 MG/ML IJ SOLN
0.2500 mg | INTRAMUSCULAR | Status: DC | PRN
  Administered 2014-08-14: 1 mg via INTRAVENOUS
  Filled 2014-08-13: qty 1

## 2014-08-13 MED ORDER — PROMETHAZINE HCL 25 MG/ML IJ SOLN: 6.2500 mg | INTRAMUSCULAR | Status: DC | PRN

## 2014-08-13 MED ORDER — METOCLOPRAMIDE HCL 10 MG PO TABS: 5.0000 mg | ORAL_TABLET | Freq: Three times a day (TID) | ORAL | Status: DC | PRN

## 2014-08-13 MED ORDER — ARTIFICIAL TEARS OP OINT
TOPICAL_OINTMENT | OPHTHALMIC | Status: DC | PRN
  Administered 2014-08-13: 1 via OPHTHALMIC

## 2014-08-13 MED ORDER — DOCUSATE SODIUM 100 MG PO CAPS
100.0000 mg | ORAL_CAPSULE | Freq: Two times a day (BID) | ORAL | Status: DC
  Administered 2014-08-13 – 2014-08-14 (×3): 100 mg via ORAL
  Filled 2014-08-13 (×3): qty 1

## 2014-08-13 MED ORDER — MIDAZOLAM HCL 2 MG/2ML IJ SOLN: 0.5000 mg | Freq: Once | INTRAMUSCULAR | Status: DC | PRN

## 2014-08-13 MED ORDER — MEPERIDINE HCL 25 MG/ML IJ SOLN: 6.2500 mg | INTRAMUSCULAR | Status: DC | PRN

## 2014-08-13 MED ORDER — OXYCODONE HCL 5 MG/5ML PO SOLN: 5.0000 mg | Freq: Once | ORAL | Status: DC | PRN

## 2014-08-13 MED ORDER — IRBESARTAN 300 MG PO TABS
300.0000 mg | ORAL_TABLET | Freq: Every day | ORAL | Status: DC
  Administered 2014-08-13: 300 mg via ORAL
  Filled 2014-08-13 (×2): qty 1

## 2014-08-13 MED ORDER — GLYCOPYRROLATE 0.2 MG/ML IJ SOLN
INTRAMUSCULAR | Status: AC
  Filled 2014-08-13: qty 4

## 2014-08-13 MED ORDER — PROPOFOL 10 MG/ML IV BOLUS
INTRAVENOUS | Status: AC
  Filled 2014-08-13: qty 20

## 2014-08-13 MED ORDER — OXYCODONE-ACETAMINOPHEN 5-325 MG PO TABS
1.0000 | ORAL_TABLET | ORAL | Status: DC | PRN
  Administered 2014-08-13: 1 via ORAL
  Administered 2014-08-14: 2 via ORAL
  Filled 2014-08-13: qty 2
  Filled 2014-08-13: qty 1

## 2014-08-13 MED ORDER — ATORVASTATIN CALCIUM 10 MG PO TABS
10.0000 mg | ORAL_TABLET | Freq: Every day | ORAL | Status: DC
  Administered 2014-08-13: 10 mg via ORAL
  Filled 2014-08-13 (×2): qty 1

## 2014-08-13 MED ORDER — DIPHENHYDRAMINE HCL 12.5 MG/5ML PO ELIX: 12.5000 mg | ORAL_SOLUTION | ORAL | Status: DC | PRN

## 2014-08-13 MED ORDER — POLYETHYLENE GLYCOL 3350 17 G PO PACK: 17.0000 g | PACK | Freq: Every day | ORAL | Status: DC | PRN

## 2014-08-13 MED ORDER — ARTIFICIAL TEARS OP OINT
TOPICAL_OINTMENT | OPHTHALMIC | Status: AC
  Filled 2014-08-13: qty 3.5

## 2014-08-13 MED ORDER — ALPRAZOLAM 0.5 MG PO TABS
1.0000 mg | ORAL_TABLET | Freq: Every evening | ORAL | Status: DC | PRN
  Administered 2014-08-13: 1 mg via ORAL
  Filled 2014-08-13: qty 4

## 2014-08-13 MED ORDER — MENTHOL 3 MG MT LOZG
1.0000 | LOZENGE | OROMUCOSAL | Status: DC | PRN
  Filled 2014-08-13: qty 9

## 2014-08-13 MED ORDER — MIDAZOLAM HCL 2 MG/2ML IJ SOLN
INTRAMUSCULAR | Status: AC
  Filled 2014-08-13: qty 2

## 2014-08-13 MED ORDER — FLUTICASONE PROPIONATE 50 MCG/ACT NA SUSP
2.0000 | Freq: Every day | NASAL | Status: DC
  Administered 2014-08-13 – 2014-08-14 (×2): 2 via NASAL
  Filled 2014-08-13 (×2): qty 16

## 2014-08-13 MED ORDER — BUDESONIDE-FORMOTEROL FUMARATE 160-4.5 MCG/ACT IN AERO
2.0000 | INHALATION_SPRAY | Freq: Two times a day (BID) | RESPIRATORY_TRACT | Status: DC | PRN
  Filled 2014-08-13: qty 6

## 2014-08-13 MED ORDER — DIAZEPAM 5 MG PO TABS: 5.0000 mg | ORAL_TABLET | Freq: Four times a day (QID) | ORAL | Status: DC | PRN

## 2014-08-13 MED ORDER — BUPIVACAINE-EPINEPHRINE (PF) 0.5% -1:200000 IJ SOLN
INTRAMUSCULAR | Status: DC | PRN
  Administered 2014-08-13: 30 mL via PERINEURAL

## 2014-08-13 MED ORDER — ALBUMIN HUMAN 5 % IV SOLN
INTRAVENOUS | Status: DC | PRN
  Administered 2014-08-13 (×2): via INTRAVENOUS

## 2014-08-13 MED ORDER — ONDANSETRON HCL 4 MG/2ML IJ SOLN
INTRAMUSCULAR | Status: AC
  Filled 2014-08-13: qty 2

## 2014-08-13 MED ORDER — ONDANSETRON HCL 4 MG/2ML IJ SOLN: 4.0000 mg | Freq: Four times a day (QID) | INTRAMUSCULAR | Status: DC | PRN

## 2014-08-13 MED ORDER — METOCLOPRAMIDE HCL 5 MG/ML IJ SOLN: 5.0000 mg | Freq: Three times a day (TID) | INTRAMUSCULAR | Status: DC | PRN

## 2014-08-13 MED ORDER — OXYCODONE HCL 5 MG PO TABS: 5.0000 mg | ORAL_TABLET | Freq: Once | ORAL | Status: DC | PRN

## 2014-08-13 MED ORDER — HYDROMORPHONE HCL 1 MG/ML IJ SOLN: 0.2500 mg | INTRAMUSCULAR | Status: DC | PRN

## 2014-08-13 MED ORDER — NEOSTIGMINE METHYLSULFATE 10 MG/10ML IV SOLN
INTRAVENOUS | Status: DC | PRN
  Administered 2014-08-13: 3 mg via INTRAVENOUS

## 2014-08-13 MED ORDER — BISACODYL 5 MG PO TBEC: 5.0000 mg | DELAYED_RELEASE_TABLET | Freq: Every day | ORAL | Status: DC | PRN

## 2014-08-13 MED ORDER — FLEET ENEMA 7-19 GM/118ML RE ENEM
1.0000 | ENEMA | Freq: Once | RECTAL | Status: AC | PRN
Start: 2014-08-13 — End: 2014-08-13

## 2014-08-13 MED ORDER — BUPIVACAINE-EPINEPHRINE (PF) 0.5% -1:200000 IJ SOLN
INTRAMUSCULAR | Status: AC
  Filled 2014-08-13: qty 30

## 2014-08-13 MED ORDER — KETOROLAC TROMETHAMINE 15 MG/ML IJ SOLN
15.0000 mg | Freq: Four times a day (QID) | INTRAMUSCULAR | Status: DC
  Administered 2014-08-13 – 2014-08-14 (×4): 15 mg via INTRAVENOUS
  Filled 2014-08-13 (×8): qty 1

## 2014-08-13 MED ORDER — OLMESARTAN MEDOXOMIL-HCTZ 40-25 MG PO TABS: 1.0000 | ORAL_TABLET | Freq: Every day | ORAL | Status: DC

## 2014-08-13 MED ORDER — GLYCOPYRROLATE 0.2 MG/ML IJ SOLN
INTRAMUSCULAR | Status: DC | PRN
  Administered 2014-08-13: 0.6 mg via INTRAVENOUS
  Administered 2014-08-13: 0.2 mg via INTRAVENOUS
  Administered 2014-08-13: 0.4 mg via INTRAVENOUS

## 2014-08-13 MED ORDER — MONTELUKAST SODIUM 10 MG PO TABS
10.0000 mg | ORAL_TABLET | Freq: Every day | ORAL | Status: DC
  Administered 2014-08-13: 10 mg via ORAL
  Filled 2014-08-13 (×2): qty 1

## 2014-08-13 MED ORDER — ALUM & MAG HYDROXIDE-SIMETH 200-200-20 MG/5ML PO SUSP: 30.0000 mL | ORAL | Status: DC | PRN

## 2014-08-13 MED ORDER — EPHEDRINE SULFATE 50 MG/ML IJ SOLN
INTRAMUSCULAR | Status: AC
  Filled 2014-08-13: qty 1

## 2014-08-13 MED ORDER — MIDAZOLAM HCL 5 MG/5ML IJ SOLN
INTRAMUSCULAR | Status: DC | PRN
  Administered 2014-08-13 (×2): 1 mg via INTRAVENOUS

## 2014-08-13 MED ORDER — ROCURONIUM BROMIDE 50 MG/5ML IV SOLN
INTRAVENOUS | Status: AC
  Filled 2014-08-13: qty 1

## 2014-08-13 MED ORDER — SODIUM CHLORIDE 0.9 % IR SOLN
Status: DC | PRN
  Administered 2014-08-13: 1000 mL

## 2014-08-13 MED ORDER — SODIUM CHLORIDE 0.9 % IJ SOLN
INTRAMUSCULAR | Status: AC
  Filled 2014-08-13: qty 10

## 2014-08-13 MED ORDER — LIDOCAINE HCL (CARDIAC) 20 MG/ML IV SOLN
INTRAVENOUS | Status: AC
  Filled 2014-08-13: qty 5

## 2014-08-13 MED ORDER — FENTANYL CITRATE 0.05 MG/ML IJ SOLN
INTRAMUSCULAR | Status: AC
  Filled 2014-08-13: qty 5

## 2014-08-13 MED ORDER — DIPHENHYDRAMINE HCL 50 MG/ML IJ SOLN
INTRAMUSCULAR | Status: AC
  Filled 2014-08-13: qty 1

## 2014-08-13 MED ORDER — ASPIRIN EC 81 MG PO TBEC
81.0000 mg | DELAYED_RELEASE_TABLET | Freq: Every day | ORAL | Status: DC
  Administered 2014-08-13 – 2014-08-14 (×2): 81 mg via ORAL
  Filled 2014-08-13 (×3): qty 1

## 2014-08-13 MED ORDER — LACTATED RINGERS IV SOLN
INTRAVENOUS | Status: DC
  Administered 2014-08-13: 14:00:00 via INTRAVENOUS

## 2014-08-13 MED ORDER — FENTANYL CITRATE 0.05 MG/ML IJ SOLN
INTRAMUSCULAR | Status: DC | PRN
  Administered 2014-08-13 (×2): 50 ug via INTRAVENOUS

## 2014-08-13 MED ORDER — CEFAZOLIN SODIUM 1-5 GM-% IV SOLN
1.0000 g | Freq: Four times a day (QID) | INTRAVENOUS | Status: AC
  Administered 2014-08-13 – 2014-08-14 (×3): 1 g via INTRAVENOUS
  Filled 2014-08-13 (×3): qty 50

## 2014-08-13 SURGICAL SUPPLY — 79 items
ADH SKN CLS APL DERMABOND .7 (GAUZE/BANDAGES/DRESSINGS) ×1
BIT DRILL 170X2.5X (BIT) IMPLANT
BIT DRL 170X2.5X (BIT)
BLADE SAW SGTL 83.5X18.5 (BLADE) ×3 IMPLANT
BRUSH FEMORAL CANAL (MISCELLANEOUS) IMPLANT
CAPT SHOULD DELTAXTEND CEM MOD ×3 IMPLANT
CLOSURE WOUND 1/2 X4 (GAUZE/BANDAGES/DRESSINGS)
COVER SURGICAL LIGHT HANDLE (MISCELLANEOUS) ×3 IMPLANT
DERMABOND ADVANCED (GAUZE/BANDAGES/DRESSINGS) ×2
DERMABOND ADVANCED .7 DNX12 (GAUZE/BANDAGES/DRESSINGS) ×1 IMPLANT
DRAPE INCISE IOBAN 66X45 STRL (DRAPES) ×3 IMPLANT
DRAPE SURG 17X11 SM STRL (DRAPES) ×3 IMPLANT
DRAPE U-SHAPE 47X51 STRL (DRAPES) ×3 IMPLANT
DRILL 2.5 (BIT)
DRILL BIT 7/64X5 (BIT) ×3 IMPLANT
DRSG AQUACEL AG ADV 3.5X10 (GAUZE/BANDAGES/DRESSINGS) ×3 IMPLANT
DRSG MEPILEX BORDER 4X8 (GAUZE/BANDAGES/DRESSINGS) IMPLANT
DURAPREP 26ML APPLICATOR (WOUND CARE) ×6 IMPLANT
ELECT BLADE 4.0 EZ CLEAN MEGAD (MISCELLANEOUS) ×3
ELECT CAUTERY BLADE 6.4 (BLADE) ×3 IMPLANT
ELECT REM PT RETURN 9FT ADLT (ELECTROSURGICAL) ×3
ELECTRODE BLDE 4.0 EZ CLN MEGD (MISCELLANEOUS) ×1 IMPLANT
ELECTRODE REM PT RTRN 9FT ADLT (ELECTROSURGICAL) ×1 IMPLANT
FACESHIELD WRAPAROUND (MASK) ×9 IMPLANT
GLOVE BIO SURGEON STRL SZ7.5 (GLOVE) IMPLANT
GLOVE BIO SURGEON STRL SZ8 (GLOVE) IMPLANT
GLOVE BIOGEL PI IND STRL 6.5 (GLOVE) ×2 IMPLANT
GLOVE BIOGEL PI IND STRL 7.5 (GLOVE) ×1 IMPLANT
GLOVE BIOGEL PI INDICATOR 6.5 (GLOVE) ×4
GLOVE BIOGEL PI INDICATOR 7.5 (GLOVE) ×2
GLOVE EUDERMIC 7 POWDERFREE (GLOVE) IMPLANT
GLOVE SS BIOGEL STRL SZ 7.5 (GLOVE) IMPLANT
GLOVE SUPERSENSE BIOGEL SZ 7.5 (GLOVE)
GLOVE SURG SS PI 6.5 STRL IVOR (GLOVE) ×3 IMPLANT
GLOVE SURG SS PI 7.5 STRL IVOR (GLOVE) ×6 IMPLANT
GOWN STRL REUS W/ TWL LRG LVL3 (GOWN DISPOSABLE) ×2 IMPLANT
GOWN STRL REUS W/ TWL XL LVL3 (GOWN DISPOSABLE) ×2 IMPLANT
GOWN STRL REUS W/TWL LRG LVL3 (GOWN DISPOSABLE) ×6
GOWN STRL REUS W/TWL XL LVL3 (GOWN DISPOSABLE) ×6
HANDPIECE INTERPULSE COAX TIP (DISPOSABLE)
KIT BASIN OR (CUSTOM PROCEDURE TRAY) ×3 IMPLANT
KIT ROOM TURNOVER OR (KITS) ×3 IMPLANT
MANIFOLD NEPTUNE II (INSTRUMENTS) ×3 IMPLANT
NDL SUT 6 .5 CRC .975X.05 MAYO (NEEDLE) IMPLANT
NEEDLE HYPO 25GX1X1/2 BEV (NEEDLE) IMPLANT
NEEDLE MAYO TAPER (NEEDLE)
NS IRRIG 1000ML POUR BTL (IV SOLUTION) ×3 IMPLANT
PACK SHOULDER (CUSTOM PROCEDURE TRAY) ×3 IMPLANT
PAD ARMBOARD 7.5X6 YLW CONV (MISCELLANEOUS) ×6 IMPLANT
PASSER SUT SWANSON 36MM LOOP (INSTRUMENTS) IMPLANT
PIN GUIDE 1.2 (PIN) IMPLANT
PIN GUIDE GLENOPHERE 1.5MX300M (PIN) IMPLANT
PIN METAGLENE 2.5 (PIN) IMPLANT
PRESSURIZER FEMORAL UNIV (MISCELLANEOUS) IMPLANT
SET HNDPC FAN SPRY TIP SCT (DISPOSABLE) IMPLANT
SLING ARM FOAM STRAP LRG (SOFTGOODS) ×3 IMPLANT
SLING ARM LRG ADULT FOAM STRAP (SOFTGOODS) IMPLANT
SLING ARM XL FOAM STRAP (SOFTGOODS) ×3 IMPLANT
SPONGE LAP 18X18 X RAY DECT (DISPOSABLE) IMPLANT
SPONGE LAP 4X18 X RAY DECT (DISPOSABLE) IMPLANT
STRIP CLOSURE SKIN 1/2X4 (GAUZE/BANDAGES/DRESSINGS) IMPLANT
SUCTION FRAZIER TIP 10 FR DISP (SUCTIONS) ×3 IMPLANT
SUT BONE WAX W31G (SUTURE) IMPLANT
SUT FIBERWIRE #2 38 T-5 BLUE (SUTURE) ×6
SUT MNCRL AB 3-0 PS2 18 (SUTURE) ×3 IMPLANT
SUT VIC AB 1 CT1 27 (SUTURE) ×3
SUT VIC AB 1 CT1 27XBRD ANBCTR (SUTURE) ×1 IMPLANT
SUT VIC AB 2-0 CT1 27 (SUTURE) ×6
SUT VIC AB 2-0 CT1 TAPERPNT 27 (SUTURE) ×2 IMPLANT
SUT VIC AB 2-0 SH 27 (SUTURE) ×6
SUT VIC AB 2-0 SH 27X BRD (SUTURE) ×2 IMPLANT
SUTURE FIBERWR #2 38 T-5 BLUE (SUTURE) ×2 IMPLANT
SYR 30ML SLIP (SYRINGE) ×3 IMPLANT
SYR CONTROL 10ML LL (SYRINGE) IMPLANT
TOWEL OR 17X24 6PK STRL BLUE (TOWEL DISPOSABLE) ×3 IMPLANT
TOWEL OR 17X26 10 PK STRL BLUE (TOWEL DISPOSABLE) ×3 IMPLANT
TOWER CARTRIDGE SMART MIX (DISPOSABLE) IMPLANT
TRAY FOLEY CATH 16FRSI W/METER (SET/KITS/TRAYS/PACK) IMPLANT
WATER STERILE IRR 1000ML POUR (IV SOLUTION) ×3 IMPLANT

## 2014-08-13 NOTE — Anesthesia Postprocedure Evaluation (Signed)
  Anesthesia Post-op Note  Patient: Aimee Jones  Procedure(s) Performed: Procedure(s) with comments: REVERSE SHOULDER ARTHROPLASTY (Right) - interscalene block  Patient Location: PACU  Anesthesia Type:GA combined with regional for post-op pain  Level of Consciousness: awake, alert , oriented and patient cooperative  Airway and Oxygen Therapy: Patient Spontanous Breathing  Post-op Pain: none  Post-op Assessment: Post-op Vital signs reviewed, Patient's Cardiovascular Status Stable, Patent Airway, No signs of Nausea or vomiting and Pain level controlled  Post-op Vital Signs: Reviewed and stable  Last Vitals:  Filed Vitals:   08/13/14 1145  BP: 116/69  Pulse: 69  Temp:   Resp: 19    Complications: No apparent anesthesia complications

## 2014-08-13 NOTE — Discharge Instructions (Signed)
° °  Kevin M. Supple, M.D., F.A.A.O.S. °Orthopaedic Surgery °Specializing in Arthroscopic and Reconstructive °Surgery of the Shoulder and Knee °[phone] °[address] - [place], [place] [address] - Fax [phone] ° ° °POST-OP TOTAL SHOULDER REPLACEMENT/SHOULDER HEMIARTHROPLASTY INSTRUCTIONS ° °1. Call the office at [phone] to schedule your first post-op appointment 10-14 days from the date of your surgery. ° °2. The bandage over your incision is waterproof. You may begin showering with this dressing on. You may leave this dressing on until first follow up appointment within 2 weeks. If you would like to remove it you may do so after the 5th day. Go slow and tug at the borders gently to break the bond the dressing has with the skin. The steri strips may come off with the dressing. At this point if there is no drainage it is okay to go without a bandage or you may cover it with a light guaze and tape. Leave the steri-strips in place over your incision. You can expect drainage that is bloody or yellow in nature that should gradually decrease from day of surgery. Change your dressing daily until drainage is completely resolved, then you may feel free to go without a bandage. You can also expect significant bruising around your shoulder that will drift down your arm and into your chest wall. This is very normal and should resolve over several days. ° ° 3. Wear your sling/immobilizer at all times except to perform the exercises below or to occasionally let your arm dangle by your side to stretch your elbow. You also need to sleep in your sling immobilizer until instructed otherwise. ° °4. Range of motion to your elbow, wrist, and hand are encouraged 3-5 times daily. Exercise to your hand and fingers helps to reduce swelling you may experience. ° °5. Utilize ice to the shoulder 3-5 times minimum a day and additionally if you are experiencing pain. ° °6. Prescriptions for a pain medication and a muscle  relaxant are provided for you. It is recommended that if you are experiencing pain that you pain medication alone is not controlling, add the muscle relaxant along with the pain medication which can give additional pain relief. The first 1-2 days is generally the most severe of your pain and then should gradually decrease. As your pain lessens it is recommended that you decrease your use of the pain medications to an "as needed basis'" only and to always comply with the recommended dosages of the pain medications. ° °7. Pain medications can produce constipation along with their use. If you experience this, the use of an over the counter stool softener or laxative daily is recommended.  ° °8. For most patients, if insurance allows, home health services to include therapy has been arranged. ° °9. For additional questions or concerns, please do not hesitate to call the office. If after hours there is an answering service to forward your concerns to the physician on call. ° °POST-OP EXERCISES ° °Pendulum Exercises ° °Perform pendulum exercises while standing and bending at the waist. Support your uninvolved arm on a table or chair and allow your operated arm to hang freely. Make sure to do these exercises passively - not using you shoulder muscles. ° °Repeat 20 times. Do 3 sessions per day. ° ° ° ° °

## 2014-08-13 NOTE — Op Note (Signed)
08/13/2014  9:43 AM  PATIENT:   Aimee Jones  68 y.o. female  PRE-OPERATIVE DIAGNOSIS:  Right Shoulder Rotator Cuff Tear Arthropathy  POST-OPERATIVE DIAGNOSIS:  same  PROCEDURE:  R RSA #12 press fit stem, +9 poly, 38 standard glenosphere  SURGEON:  Berdell Hostetler, Metta Clines M.D.  ASSISTANTS: Shuford pac   ANESTHESIA:   GET + ISB  EBL: 300  SPECIMEN:  none  Drains: none   PATIENT DISPOSITION:  PACU - hemodynamically stable.    PLAN OF CARE: Admit to inpatient   Dictation# [id number]

## 2014-08-13 NOTE — Transfer of Care (Signed)
Immediate Anesthesia Transfer of Care Note  Patient: Aimee Jones  Procedure(s) Performed: Procedure(s) with comments: REVERSE SHOULDER ARTHROPLASTY (Right) - interscalene block  Patient Location: PACU  Anesthesia Type:General and Regional  Level of Consciousness: awake, alert , oriented and sedated  Airway & Oxygen Therapy: Patient Spontanous Breathing and Patient connected to nasal cannula oxygen  Post-op Assessment: Report given to PACU RN, Post -op Vital signs reviewed and stable and Patient moving all extremities  Post vital signs: Reviewed and stable  Complications: No apparent anesthesia complications

## 2014-08-13 NOTE — H&P (Signed)
Aimee Jones    Chief Complaint: right shoulder rotator cuff tear arthropathy HPI: The patient is a 68 y.o. 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 Hyperlipidemia   . ETOH abuse   . Allergy   . Diverticulosis   . Hypertension   . Depression   . Asthma   . GERD (gastroesophageal reflux disease)   . Normal 24 hour ambulatory pH monitoring study     good acid suppression  . Dementia   . Shortness of breath   . Anxiety     panic attacks- on occas.   . Arthritis     osteoporosis - especially hips.     Past Surgical History  Procedure Laterality Date  . Dobutamine stress echo  07/09/2001    normal  . Esophagogastroduodenoscopy  06/11/2006    normal  . Abdominal hysterectomy    . Total hip arthroplasty      left 1991/right 2008  . Nasal sinus surgery  1990  . Eye surgery      cataracts removed, IOL- both eyes   . Breast surgery      3x- biposies   . Joint replacement    . Shoulder arthroscopy Left     History reviewed. No pertinent family history.  Social History:  reports that she has never smoked. She has never used smokeless tobacco. She reports that she drinks alcohol. She reports that she does not use illicit drugs.  Allergies:  Allergies  Allergen Reactions  . Latex Rash  . Penicillins Rash    Medications Prior to Admission  Medication Sig Dispense Refill  . ALPRAZolam (XANAX) 0.5 MG tablet Take 1 mg by mouth at bedtime as needed for anxiety.      Marland Kitchen aspirin 81 MG tablet Take 81 mg by mouth daily.      Marland Kitchen atorvastatin (LIPITOR) 10 MG tablet Take 10 mg by mouth daily at 6 PM.       . B Complex-C (SUPER B COMPLEX PO) Take 150 mg by mouth daily.       . citalopram (CELEXA) 40 MG tablet Take 40 mg by mouth at bedtime.       . donepezil (ARICEPT) 10 MG tablet Take 10 mg by mouth daily before breakfast.       . esomeprazole (NEXIUM) 40 MG capsule Take 40 mg by mouth daily at 12 noon.      .  fexofenadine (ALLEGRA) 180 MG tablet Take 180 mg by mouth daily before breakfast.       . fish oil-omega-3 fatty acids 1000 MG capsule Take 1 g by mouth daily.       . fluticasone (FLONASE) 50 MCG/ACT nasal spray Place 2 sprays into both nostrils daily.      . montelukast (SINGULAIR) 10 MG tablet Take 10 mg by mouth at bedtime.       . Multiple Vitamins-Minerals (MULTIVITAMIN PO) Take 1 tablet by mouth daily.      . nabumetone (RELAFEN) 750 MG tablet Take 750 mg by mouth daily.      . naproxen sodium (ALEVE) 220 MG tablet Take 220 mg by mouth 3 (three) times daily with meals.      Marland Kitchen olmesartan-hydrochlorothiazide (BENICAR HCT) 40-25 MG per tablet Take 1 tablet by mouth at bedtime.       . thiamine (VITAMIN B-1) 100 MG tablet Take 100 mg by mouth daily.      Marland Kitchen VITAMIN  E PO Take 1 capsule by mouth daily.          Physical Exam: right shoulder with painful and restricted motion as noted at recent office visits  Vitals  Temp:  [97.7 F (36.5 C)] 97.7 F (36.5 C) (10/29 6681) Pulse Rate:  [66] 66 (10/29 0611) Resp:  [16] 16 (10/29 0611) BP: (90-103)/(56-59) 103/59 mmHg (10/29 0633) SpO2:  [96 %] 96 % (10/29 0611) Weight:  [80.6 kg (177 lb 11.1 oz)] 80.6 kg (177 lb 11.1 oz) (10/29 0606)  Assessment/Plan  Impression: right shoulder rotator cuff tear arthropathy  Plan of Action: Procedure(s): REVERSE SHOULDER ARTHROPLASTY  Milinda Sweeney M 08/13/2014, 7:23 AM

## 2014-08-13 NOTE — Anesthesia Preprocedure Evaluation (Addendum)
Anesthesia Evaluation  Patient identified by MRN, date of birth, ID band Patient awake    Reviewed: Allergy & Precautions, H&P , NPO status , Patient's Chart, lab work & pertinent test results  History of Anesthesia Complications Negative for: history of anesthetic complications  Airway Mallampati: II  TM Distance: >3 FB Neck ROM: Full    Dental  (+) Dental Advisory Given   Pulmonary asthma , COPD COPD inhaler,  breath sounds clear to auscultation        Cardiovascular hypertension, Pt. on medications - anginaRate:Normal     Neuro/Psych PSYCHIATRIC DISORDERS (early dementia) Anxiety Depression  Neuromuscular disease (myositis)    GI/Hepatic GERD-  Medicated and Controlled,(+)     substance abuse  alcohol use,   Endo/Other  negative endocrine ROS  Renal/GU Renal InsufficiencyRenal disease (creat 1.45)     Musculoskeletal  (+) Arthritis -, Osteoarthritis,    Abdominal   Peds  Hematology negative hematology ROS (+) Blood dyscrasia (Hb 10.5), ,   Anesthesia Other Findings   Reproductive/Obstetrics                          Anesthesia Physical Anesthesia Plan  ASA: III  Anesthesia Plan: General   Post-op Pain Management:    Induction: Intravenous  Airway Management Planned: Oral ETT  Additional Equipment:   Intra-op Plan:   Post-operative Plan: Extubation in OR  Informed Consent: I have reviewed the patients History and Physical, chart, labs and discussed the procedure including the risks, benefits and alternatives for the proposed anesthesia with the patient or authorized representative who has indicated his/her understanding and acceptance.   Dental advisory given  Plan Discussed with: CRNA and Surgeon  Anesthesia Plan Comments: (Plan routine monitors, GETA with interscalene block for post op analgesia)        Anesthesia Quick Evaluation

## 2014-08-13 NOTE — Progress Notes (Signed)
Utilization review completed.  

## 2014-08-13 NOTE — Op Note (Signed)
Aimee Jones, Aimee NO.:  [id number]  MEDICAL RECORD NO.:  [id number]  LOCATION:  5N19C                        FACILITY:  [facility]  PHYSICIAN:  Metta Clines. Rondell Pardon, M.D.  DATE OF BIRTH:  [birth year]  DATE OF PROCEDURE:  08/13/2014 DATE OF DISCHARGE:                              OPERATIVE REPORT   PREOPERATIVE DIAGNOSIS:  Right shoulder end-stage rotator cuff tear arthropathy.  POSTOPERATIVE DIAGNOSIS:  Right shoulder end-stage rotator cuff tear arthropathy.  PROCEDURE:  Right reverse shoulder arthroplasty utilizing a press-fit size 12 DePuy stem with a +9 polyethylene insert and a 38 standard glenosphere.  SURGEON:  Metta Clines. Joevanni Roddey, M.D.  Terrence DupontOlivia Mackie A. Shuford, P.A.-C.  ANESTHESIA:  General endotracheal as well as interscalene block.  ESTIMATED BLOOD LOSS:  200 mL.  DRAINS:  None.  HISTORY:  Aimee Jones is a 68 year old female who has had chronic progressive increasing right shoulder pain related to chronic rotator cuff tear arthropathy.  Due to her increasing pain and functional limitations, and failure to respond to conservative management, diminishing quality of life, she is brought to the operating room at this time for planned right shoulder reverse arthroplasty.  Preoperatively, I counseled Aimee Jones regarding treatment options and risks versus benefits thereof.  Possible surgical complications were all reviewed with potential for bleeding, infection, neurovascular injury, persistent pain, loss of motion, anesthetic complication, failure of the implant, and possible need for additional surgery.  She understands and accepts and agrees with our planned procedure.  PROCEDURE IN DETAIL:  After undergoing routine preop evaluation, the patient received prophylactic antibiotics and an interscalene block was established in holding area by the Anesthesia Department.  Placed supine on the operating table, underwent smooth induction of a  general endotracheal anesthesia.  Placed into beach-chair position and appropriately padded and protected.  Right shoulder girdle region was sterilely prepped and draped in standard fashion.  Time-out was called. An anterior deltopectoral approach to the right shoulder was made along 10 cm incision anterior across the shoulder.  Skin flaps were elevated and electrocautery was used for hemostasis.  The deltopectoral interval was identified with cephalic vein retracted laterally in the deltoid and the upper centimeter and a half of the pec major tendon was divided to enhance exposure.  Adhesions were divided from beneath the deltoid and Browne retractor was placed.  We then identified and mobilized the conjoined tendon which was carefully retracted medially.  At this point, the subscapularis was then mobilized away from the lesser tuberosity with electrocautery and the free margin was then tagged with #2 FiberWires.  We mobilized the anterior surface of the subscap releasing adhesions to the undersurface of the conjoined tendon.  There had been previous rupture of long head biceps and it had auto-tenodesed.  The humeral head was then delivered through the wound as we divided capsular attachments on the anteroinferior and inferior aspect of the humeral head allowing complete delivery of the humeral head.  A rongeur was then used to create a defect at the apex of the humeral head.  We then performed hand reaming of the humeral canal up to size 12.  The intramedullary guide was placed and we used the cutting guide to perform  a humeral head resection at 0 degrees retroversion.  A metal cap was then placed over the cut surface of the proximal humerus.  We then exposed the glenoid with combination of Fukuda, pitchfork, and snake tongue retractors.  Mobilized the subscapularis, dividing anterior adhesions and then performed a circumferential excision of the residual glenoid labrum allowing  complete visualization of periphery of the entire glenoid.  The central guidepin was placed.  We then reamed the glenoid to subchondral bone.  Central drill hole was then placed.  We impacted our glenoid baseplate and after we took care to confirm that the residual soft tissue debrided, the margins of the glenoid were impeding impaction and the glenoid baseplate was then impacted and then transfixed with inferior and superior locking screws and anterior- posterior nonlocking screws, all of with excellent fit and fixation and the locking screws were then terminally tightened and then, a 38 standard glenosphere was impacted and tightened with excellent fit and fixation.  We then returned our attention to the humerus where we prepared the proximal humerus with the reaming guide using the size 1 eccentric stem and reamer.  We then performed trial reduction which showed good soft tissue balance and good stability.  The trial was then removed.  We assembled the press-fit size 12 stem with the eccentric 1 metaphysis and then, this was impacted into the humerus with excellent fit and fixation at approximately 5 degrees of retroversion.  We then performed trial reductions and ultimately, the +9 poly showed best soft tissue balance and +9 poly was then impacted into the humeral prosthesis with excellent fit.  The final reduction was performed showing excellent soft tissue balance, good shoulder mobility and good stability.  The wound was copiously irrigated.  Hemostasis was obtained.  We repaired the subscapularis back to the proximal humeral metaphysis with #2 FiberWires.  The shoulder was taken through range of motion showing again good motion, good stability.  The wound was irrigated.  Hemostasis was obtained.  The deltopectoral interval was then reapproximated with a series of figure-of-eight #1 Vicryl sutures, 2-0 Vicryl was used for the subcu layer, and intracuticular 3-0 Monocryl for the  subcu layer, and the skin was then closed with DermaBond.  Dry dressing was then applied. The right arm was placed in a sling.  The patient was awakened, extubated, and taken to recovery room in stable condition.  Tracy A. Shuford, PA-C was used as an Environmental consultant throughout this case, essential for help with positioning the patient, positioning the extremity, tissue manipulations, suture management, implantation of the prosthesis wound closer, and intraoperative-decision making.     Metta Clines. Jasline Buskirk, M.D.     KMS/MEDQ  D:  08/13/2014  T:  08/13/2014  Job:  [id number]

## 2014-08-13 NOTE — Anesthesia Procedure Notes (Signed)
Anesthesia Regional Block:  Interscalene brachial plexus block  Pre-Anesthetic Checklist: ,, timeout performed, Correct Patient, Correct Site, Correct Laterality, Correct Procedure, Correct Position, site marked, Risks and benefits discussed,  Surgical consent,  Pre-op evaluation,  At surgeon's request and post-op pain management  Laterality: Right and Upper  Prep: chloraprep       Needles:  Injection technique: Single-shot  Needle Type: Echogenic Stimulator Needle     Needle Length: 4cm 4 cm Needle Gauge: 22 and 22 G    Additional Needles:  Procedures: nerve stimulator Interscalene brachial plexus block  Nerve Stimulator or Paresthesia:  Response: forearm twitch, 0.45 mA, 0.1 ms,   Additional Responses:   Narrative:  Start time: 08/13/2014 7:19 AM End time: 08/13/2014 7:25 AM Injection made incrementally with aspirations every 5 mL.  Performed by: Personally  Anesthesiologist: Jenita Seashore, MD  Additional Notes: Pt identified in Holding room.  Monitors applied. Working IV access confirmed. Sterile prep R neck.  #22ga PNS to forearm twitch at 0.27mA threshold.  30cc 0.5% Bupivacaine with 1:200k epi injected incrementally after negative test dose.  Patient asymptomatic, VSS, no heme aspirated, tolerated well.  Jenita Seashore, MD

## 2014-08-14 ENCOUNTER — Encounter (HOSPITAL_COMMUNITY): Payer: Self-pay | Admitting: Orthopedic Surgery

## 2014-08-14 DIAGNOSIS — M75101 Unspecified rotator cuff tear or rupture of right shoulder, not specified as traumatic: Secondary | ICD-10-CM | POA: Diagnosis not present

## 2014-08-14 MED ORDER — OXYCODONE-ACETAMINOPHEN 5-325 MG PO TABS
1.0000 | ORAL_TABLET | ORAL | Status: DC | PRN
Start: 2014-08-14 — End: 2014-09-04

## 2014-08-14 MED ORDER — DIAZEPAM 5 MG PO TABS: 2.5000 mg | ORAL_TABLET | Freq: Four times a day (QID) | ORAL | Status: DC | PRN

## 2014-08-14 NOTE — Progress Notes (Signed)
CARE MANAGEMENT NOTE 08/14/2014  Patient:  Aimee Jones, Aimee Jones   Account Number:  [id number]  Date Initiated:  08/14/2014  Documentation initiated by:  [facility]  Subjective/Objective Assessment:   admitted s/p rt reverse shoulder     Action/Plan:   PT/OT evals-recommended HHPT and HHOT   Anticipated DC Date:  08/14/2014   Anticipated DC Plan:  [place]  CM consult      [facility] Choice  HOME HEALTH   Choice offered to / List presented to:  C-1 Patient        [place] arranged  [facility].   Status of service:  Completed, signed off Medicare Important Message given?  NA - LOS <3 / Initial given by admissions (If response is "NO", the following Medicare IM given date fields will be blank) Date Medicare IM given:   Medicare IM given by:   Date Additional Medicare IM given:   Additional Medicare IM given by:    Discharge Disposition:  [place]  Per UR Regulation:  Reviewed for med. necessity/level of care/duration of stay  If discussed at [place] of Stay Meetings, dates discussed:    Comments:  08/14/14 Spoke with patient and her husband about [facility]. They chose [facility] from [employer]. agencies list. Contacted Miranda at [facility], set up [place] and OT. No equipment needs identified. Patient's spouse will assist post discharge. Blanch Media RN, BSN, CCM

## 2014-08-14 NOTE — Evaluation (Signed)
Physical Therapy Evaluation Patient Details Name: Aimee Jones MRN: [id number] DOB: 03/29/1946 Today's Date: 08/14/2014   History of Present Illness  Patient is a 68 yo female admitted 08/13/14 with Rt shoulder rotator cuff tear.  Patient s/p reverse TSA on Rt.  PMH:  ETOH abuse, HTN, Depression, dementia, anxiety, prior shoulder surgeries.  Clinical Impression  Patient presents with problems listed below.  Was able to ambulate 82' with supervision.  All further needs can be met with HHPT at discharge.  Patient with decreased cognition baseline.  Husband is with patient 24/7.  Patient ready for d/c home from PT perspective.    Follow Up Recommendations Home health PT;Supervision/Assistance - 24 hour    Equipment Recommendations  None recommended by PT    Recommendations for Other Services       Precautions / Restrictions Precautions Precautions: Shoulder Required Braces or Orthoses: Sling Restrictions Weight Bearing Restrictions: Yes RUE Weight Bearing: Non weight bearing      Mobility  Bed Mobility Overal bed mobility: Needs Assistance Bed Mobility: Rolling;Sidelying to Sit Rolling: Min guard Sidelying to sit: Min assist       General bed mobility comments: Verbal cues to roll to left side, and keep RUE on stomach.  Cues to use LUE and LE's to assist with moving to sitting.  Patient required assist at trunk to move to sitting.  Instructed husband on proper technique to assist wife with this at home.  Once upright, patient with good balance.  Transfers Overall transfer level: Needs assistance Equipment used: None Transfers: Sit to/from Stand Sit to Stand: Supervision         General transfer comment: Verbal cues to use LUE to move to standing, and to stand for several seconds before ambulation for safety.  Good balance in stance.  Ambulation/Gait Ambulation/Gait assistance: Supervision Ambulation Distance (Feet): 86 Feet Assistive device: None Gait  Pattern/deviations: Step-through pattern;Decreased stride length Gait velocity: Slightly decreased Gait velocity interpretation: Below normal speed for age/gender General Gait Details: Patient with slow, guarded gait.  Good balance with gait.  Assist for safety only.  Stairs            Wheelchair Mobility    Modified Rankin (Stroke Patients Only)       Balance                                             Pertinent Vitals/Pain Pain Assessment: Faces Faces Pain Scale: Hurts little more Pain Location: Lt shoulder - reports no pain in Rt shoulder Pain Descriptors / Indicators: Aching Pain Intervention(s): Limited activity within patient's tolerance;Monitored during session    Home Living Family/patient expects to be discharged to:: Private residence Living Arrangements: Spouse/significant other Available Help at Discharge: Family;Available 24 hours/day Type of Home: House Home Access: Stairs to enter Entrance Stairs-Rails: Psychiatric nurse of Steps: 4 Home Layout: One level Home Equipment: Walker - 2 wheels;Cane - single point;Crutches      Prior Function Level of Independence: Independent         Comments: Husband drives     Hand Dominance        Extremity/Trunk Assessment   Upper Extremity Assessment: RUE deficits/detail;LUE deficits/detail RUE Deficits / Details: s/p Rt reverse TSA, Rt UE in sling.     LUE Deficits / Details: Decreased shoulder movement - strength and ROM   Lower Extremity Assessment: Overall  WFL for tasks assessed      Cervical / Trunk Assessment: Normal  Communication   Communication: No difficulties  Cognition Arousal/Alertness: Awake/alert Behavior During Therapy: Anxious Overall Cognitive Status: History of cognitive impairments - at baseline Area of Impairment: Orientation;Attention;Memory;Safety/judgement;Awareness;Problem solving Orientation Level: Disoriented  to;Time;Situation Current Attention Level: Sustained Memory: Decreased short-term memory;Decreased recall of precautions   Safety/Judgement: Decreased awareness of deficits;Decreased awareness of safety   Problem Solving: Slow processing;Requires verbal cues General Comments: Patient unable to recall that she had surgery yesterday.  States she is in the hospital to be "checked out".  Patient looks to husband to answer questions.  Needs repeated verbal cues not to use RUE.    General Comments      Exercises        Assessment/Plan    PT Assessment All further PT needs can be met in the next venue of care  PT Diagnosis Abnormality of gait;Acute pain   PT Problem List Decreased strength;Decreased range of motion;Decreased activity tolerance;Decreased mobility;Decreased balance;Decreased cognition;Decreased safety awareness;Decreased knowledge of precautions;Pain  PT Treatment Interventions     PT Goals (Current goals can be found in the Care Plan section) Acute Rehab PT Goals PT Goal Formulation: All assessment and education complete, DC therapy    Frequency     Barriers to discharge        Co-evaluation               End of Session Equipment Utilized During Treatment: Gait belt;Other (comment) (Sling) Activity Tolerance: Patient tolerated treatment well Patient left: in chair;with call bell/phone within reach;with family/visitor present Nurse Communication: Mobility status (Need for HHPT; Ready for bath)         Time: 4098-1191 PT Time Calculation (min): 21 min   Charges:   PT Evaluation $Initial PT Evaluation Tier I: 1 Procedure PT Treatments $Gait Training: 8-22 mins   PT G CodesDespina Pole 08/14/2014, 11:33 AM  Carita Pian. Sanjuana Kava, [place] Pager [phone]

## 2014-08-14 NOTE — Evaluation (Signed)
Occupational Therapy Evaluation Patient Details Name: Aimee Jones MRN: [id number] DOB: 03-24-1946 Today's Date: 08/14/2014    History of Present Illness Patient is a 68 yo female admitted 08/13/14 with Rt shoulder rotator cuff tear.  Patient s/p reverse TSA on Rt.  PMH:  ETOH abuse, HTN, Depression, dementia, anxiety, prior shoulder surgeries.   Clinical Impression   Pt admitted with the above diagnoses and presents with below problem list. PTA pt was independent with ADLs. Pt currently at min A level for UB ADLs. ADL and sling education provided to pt and spouse. Pt with baseline cognitive impairment. Provided shoulder handout. Pt completed exercises of RUE as detailed below. Recommend HHOT.     Follow Up Recommendations  Home health OT;Supervision/Assistance - 24 hour    Equipment Recommendations  None recommended by OT    Recommendations for Other Services       Precautions / Restrictions Precautions Precautions: Shoulder Required Braces or Orthoses: Sling Restrictions Weight Bearing Restrictions: Yes RUE Weight Bearing: Non weight bearing      Mobility Bed Mobility Overal bed mobility: Needs Assistance Bed Mobility: Rolling;Sidelying to Sit Rolling: Min guard Sidelying to sit: Min assist       General bed mobility comments: in recliner  Transfers Overall transfer level: Needs assistance Equipment used: None Transfers: Sit to/from Stand Sit to Stand: Supervision         General transfer comment: Verbal cues to use LUE to move to standing, and to stand for several seconds before ambulation for safety.  Good balance in stance.    Balance                                            ADL Overall ADL's : Needs assistance/impaired Eating/Feeding: Set up;Sitting   Grooming: Set up;Sitting;Standing   Upper Body Bathing: Minimal assitance;Sitting   Lower Body Bathing: Min guard;Sit to/from stand   Upper Body Dressing : Minimal  assistance;Sitting   Lower Body Dressing: Min guard;Sit to/from stand   Toilet Transfer: Min guard;Ambulation;Comfort height toilet;Grab bars   Toileting- Clothing Manipulation and Hygiene: Min guard;Sit to/from stand   Tub/ Shower Transfer: Min guard;Ambulation;Shower seat   Functional mobility during ADLs: Min guard General ADL Comments: Educated pt and spouse on techniques for ADL completion and sling education provided. Gave shoulder protocal handout to spouse.     Vision                     Perception     Praxis      Pertinent Vitals/Pain Pain Assessment: 0-10 Pain Score: 5  Faces Pain Scale: Hurts little more Pain Location: Rt shoulder Pain Descriptors / Indicators: Aching Pain Intervention(s): Limited activity within patient's tolerance;Monitored during session;Repositioned     Hand Dominance Right   Extremity/Trunk Assessment Upper Extremity Assessment Upper Extremity Assessment: RUE deficits/detail RUE Deficits / Details: s/p Rt reverse TSA, Rt UE in sling. RUE: Unable to fully assess due to immobilization LUE Deficits / Details: Decreased shoulder movement - strength and ROM   Lower Extremity Assessment Lower Extremity Assessment: Defer to PT evaluation   Cervical / Trunk Assessment Cervical / Trunk Assessment: Normal   Communication Communication Communication: No difficulties   Cognition Arousal/Alertness: Awake/alert Behavior During Therapy: WFL for tasks assessed/performed Overall Cognitive Status: History of cognitive impairments - at baseline Area of Impairment: Orientation;Attention;Memory;Safety/judgement;Awareness;Problem solving Orientation Level: Disoriented to;Time;Situation Current  Attention Level: Sustained Memory: Decreased short-term memory;Decreased recall of precautions   Safety/Judgement: Decreased awareness of deficits;Decreased awareness of safety   Problem Solving: Slow processing;Requires verbal cues General Comments:  Lots of vc regarding position of RUE. Education provided to spouse. Spouse has waist strap for sling in the car. Recommnended to pt and spouse using waist strap and educated on its use.   General Comments       Exercises Exercises: Shoulder     Shoulder Instructions      Home Living Family/patient expects to be discharged to:: Private residence Living Arrangements: Spouse/significant other Available Help at Discharge: Family;Available 24 hours/day Type of Home: House Home Access: Stairs to enter [employer] of Steps: 4 Entrance Stairs-Rails: Right;Left Home Layout: One level     Bathroom Shower/Tub: Occupational psychologist: Standard     Home Equipment: Environmental consultant - 2 wheels;Cane - single point;Crutches          Prior Functioning/Environment Level of Independence: Independent        Comments: Husband drives    OT Diagnosis: Acute pain   OT Problem List: Decreased cognition;Decreased safety awareness;Decreased knowledge of use of DME or AE;Decreased knowledge of precautions;Impaired UE functional use;Pain   OT Treatment/Interventions:      OT Goals(Current goals can be found in the care plan section) Acute Rehab OT Goals Patient Stated Goal: not stated  OT Frequency:     Barriers to D/C:            Co-evaluation              End of Session Equipment Utilized During Treatment: Other (comment) (sling)  Activity Tolerance: Patient tolerated treatment well Patient left: in chair;with call bell/phone within reach;with family/visitor present   Time: 1130-1154 OT Time Calculation (min): 24 min Charges:  OT General Charges $OT Visit: 1 Procedure OT Evaluation $Initial OT Evaluation Tier I: 1 Procedure OT Treatments $Self Care/Home Management : 8-22 mins G-Codes:    Hortencia Pilar 2014/08/20, 12:13 PM

## 2014-08-14 NOTE — Progress Notes (Signed)
Spoke with [facility] Newcomer PT who will be in to see patient at approximately 11:00. Patient and spouse notified.

## 2014-08-14 NOTE — Discharge Summary (Signed)
PATIENT ID:      Aimee Jones  MRN:     [id number] DOB/AGE:    1945/11/06 / 68 y.o.     DISCHARGE SUMMARY  ADMISSION DATE:    08/13/2014 DISCHARGE DATE:    ADMISSION DIAGNOSIS: Right Shoulder Rotator Cuff Tear Arthropathy Past Medical History  Diagnosis Date  . Hyperlipidemia   . ETOH abuse   . Allergy   . Diverticulosis   . Hypertension   . Depression   . Asthma   . GERD (gastroesophageal reflux disease)   . Normal 24 hour ambulatory pH monitoring study     good acid suppression  . Dementia   . Shortness of breath   . Anxiety     panic attacks- on occas.   . Arthritis     osteoporosis - especially hips.     DISCHARGE DIAGNOSIS:   Active Problems:   S/P shoulder replacement   PROCEDURE: Procedure(s): REVERSE SHOULDER ARTHROPLASTY on 08/13/2014  CONSULTS:     HISTORY:  See H&P in chart.  HOSPITAL COURSE:  Aimee Jones is a 68 y.o. admitted on 08/13/2014 with a chief complaint of severe right shoulder pain and dysfunction, and found to have a diagnosis of Right Shoulder Rotator Cuff Tear Arthropathy.  They were brought to the operating room on 08/13/2014 and underwent Procedure(s): [place].    They were given perioperative antibiotics: Anti-infectives   Start     Dose/Rate Route Frequency Ordered Stop   08/13/14 1400  ceFAZolin (ANCEF) IVPB 1 g/50 mL premix     1 g 100 mL/hr over 30 Minutes Intravenous Every 6 hours 08/13/14 1228 08/14/14 0150   08/13/14 0600  ceFAZolin (ANCEF) IVPB 2 g/50 mL premix     2 g 100 mL/hr over 30 Minutes Intravenous On call to O.R. 08/12/14 1424 08/13/14 0807    .  Patient underwent the above named procedure and tolerated it well. The following day they were hemodynamically stable and pain was controlled on oral analgesics. Patient was alert but mildly confused which has been her baseline on previous outpatient encounters. They were neurovascularly intact to the operative extremity. OT was ordered and  worked with patient per protocol. They were medically and orthopaedically stable for discharge on day 1.    DIAGNOSTIC STUDIES:  RECENT RADIOGRAPHIC STUDIES :  No results found.  RECENT VITAL SIGNS:  Patient Vitals for the past 24 hrs:  BP Temp Temp src Pulse Resp SpO2  08/14/14 0506 104/65 mmHg 98.1 F (36.7 C) Oral 16 16 96 %  08/14/14 0047 116/63 mmHg 98.2 F (36.8 C) Oral 16 16 94 %  08/13/14 2150 136/70 mmHg 99.2 F (37.3 C) Oral 16 16 93 %  08/13/14 1215 121/74 mmHg 97.6 F (36.4 C) Oral 67 20 97 %  08/13/14 1145 116/69 mmHg - - 69 19 97 %  08/13/14 1130 119/67 mmHg 97.4 F (36.3 C) - 66 18 99 %  08/13/14 1115 104/67 mmHg - - 67 13 98 %  08/13/14 1100 118/62 mmHg 96.8 F (36 C) - 68 16 99 %  08/13/14 1045 117/63 mmHg - - 72 20 100 %  08/13/14 1030 115/56 mmHg - - 83 15 99 %  08/13/14 1015 108/60 mmHg - - 82 14 98 %  08/13/14 1000 105/59 mmHg - - 91 13 99 %  08/13/14 0954 121/66 mmHg 96.8 F (36 C) - 91 11 98 %  .  RECENT EKG RESULTS:    Orders placed during the  hospital encounter of 08/07/14  . EKG 12-LEAD  . EKG 12-LEAD    DISCHARGE INSTRUCTIONS:    DISCHARGE MEDICATIONS:     Medication List    STOP taking these medications       ALPRAZolam 0.5 MG tablet  Commonly known as:  XANAX     HYDROcodone-acetaminophen 5-325 MG per tablet  Commonly known as:  NORCO/VICODIN      TAKE these medications       ALEVE 220 MG tablet  Generic drug:  naproxen sodium  Take 220 mg by mouth 3 (three) times daily with meals.     aspirin 81 MG tablet  Take 81 mg by mouth daily.     atorvastatin 10 MG tablet  Commonly known as:  LIPITOR  Take 10 mg by mouth daily at 6 PM.     budesonide-formoterol 160-4.5 MCG/ACT inhaler  Commonly known as:  SYMBICORT  Inhale 2 puffs into the lungs 2 (two) times daily as needed (shortness of breath).     citalopram 40 MG tablet  Commonly known as:  CELEXA  Take 40 mg by mouth at bedtime.     diazepam 5 MG tablet  Commonly  known as:  VALIUM  Take 0.5-1 tablets (2.5-5 mg total) by mouth every 6 (six) hours as needed for muscle spasms or sedation.     donepezil 10 MG tablet  Commonly known as:  ARICEPT  Take 10 mg by mouth daily before breakfast.     esomeprazole 40 MG capsule  Commonly known as:  NEXIUM  Take 40 mg by mouth daily at 12 noon.     fexofenadine 180 MG tablet  Commonly known as:  ALLEGRA  Take 180 mg by mouth daily before breakfast.     fish oil-omega-3 fatty acids 1000 MG capsule  Take 1 g by mouth daily.     fluticasone 50 MCG/ACT nasal spray  Commonly known as:  FLONASE  Place 2 sprays into both nostrils daily.     montelukast 10 MG tablet  Commonly known as:  SINGULAIR  Take 10 mg by mouth at bedtime.     MULTIVITAMIN PO  Take 1 tablet by mouth daily.     nabumetone 750 MG tablet  Commonly known as:  RELAFEN  Take 750 mg by mouth daily.     olmesartan-hydrochlorothiazide 40-25 MG per tablet  Commonly known as:  BENICAR HCT  Take 1 tablet by mouth at bedtime.     oxyCODONE-acetaminophen 5-325 MG per tablet  Commonly known as:  PERCOCET  Take 1-2 tablets by mouth every 4 (four) hours as needed.     SUPER B COMPLEX PO  Take 150 mg by mouth daily.     thiamine 100 MG tablet  Commonly known as:  VITAMIN B-1  Take 100 mg by mouth daily.     VITAMIN E PO  Take 1 capsule by mouth daily.        FOLLOW UP VISIT:       Follow-up Information   Follow up with Marin Shutter, MD. (call to be seen in 10-14 days)    Specialty:  Orthopedic Surgery   Contact information:   [address] [place] [address] [place] [address] [phone]       DISCHARGE TO: Home  DISPOSITION: Good  DISCHARGE CONDITION:  [name] for Dr. Justice Britain 08/14/2014, 7:55 AM

## 2014-08-14 NOTE — Progress Notes (Signed)
Slightly limited

## 2014-08-14 NOTE — Progress Notes (Signed)
Slightly congitively limited

## 2014-08-16 DIAGNOSIS — F0390 Unspecified dementia without behavioral disturbance: Secondary | ICD-10-CM | POA: Diagnosis not present

## 2014-08-16 DIAGNOSIS — Z96611 Presence of right artificial shoulder joint: Secondary | ICD-10-CM | POA: Diagnosis not present

## 2014-08-16 DIAGNOSIS — Z471 Aftercare following joint replacement surgery: Secondary | ICD-10-CM | POA: Diagnosis not present

## 2014-08-16 DIAGNOSIS — F329 Major depressive disorder, single episode, unspecified: Secondary | ICD-10-CM | POA: Diagnosis not present

## 2014-08-16 DIAGNOSIS — J45909 Unspecified asthma, uncomplicated: Secondary | ICD-10-CM | POA: Diagnosis not present

## 2014-08-16 DIAGNOSIS — K219 Gastro-esophageal reflux disease without esophagitis: Secondary | ICD-10-CM | POA: Diagnosis not present

## 2014-08-16 DIAGNOSIS — M160 Bilateral primary osteoarthritis of hip: Secondary | ICD-10-CM | POA: Diagnosis not present

## 2014-08-16 DIAGNOSIS — I10 Essential (primary) hypertension: Secondary | ICD-10-CM | POA: Diagnosis not present

## 2014-08-17 ENCOUNTER — Telehealth: Payer: Self-pay

## 2014-08-17 MED ORDER — OLMESARTAN MEDOXOMIL-HCTZ 40-25 MG PO TABS: 0.5000 | ORAL_TABLET | Freq: Every day | ORAL | Status: DC

## 2014-08-17 NOTE — Telephone Encounter (Signed)
I would skip the next dose of benicar, then start taking 1/2 tab qhs starting tomorrow.  Update me re: her BP as needed. If not improved, we'll have to taper the medicine back more.  Thanks.

## 2014-08-17 NOTE — Telephone Encounter (Signed)
Patient's husband notified as instructed by telephone. Patient verbalized that he understood.

## 2014-08-17 NOTE — Telephone Encounter (Signed)
Aimee Aimee Jones left v/m; Aimee Jones had shoulder replacement surgery on 08/13/14. Aimee Jones is taking oxycodone 5 mg taking 2 tabs every 3-4 hours for pain; Aimee Jones also taking acetaminophen or aleve as needed.BP averaging 89/62. OT nurse took BP on 08/16/14 and BP was 88/56 still low and suggested Aimee Jones contact Dr [name] to see if needs to decrease BP med or should Aimee Jones be seen.Today BP 89/62. No h/a,dizziness, SOB or CP. Aimee Jones request cb. Aimee Jones is taking Benicar HCT40-25 mg at bedtime.Please advise.[facility].

## 2014-08-18 DIAGNOSIS — K219 Gastro-esophageal reflux disease without esophagitis: Secondary | ICD-10-CM | POA: Diagnosis not present

## 2014-08-18 DIAGNOSIS — J45909 Unspecified asthma, uncomplicated: Secondary | ICD-10-CM | POA: Diagnosis not present

## 2014-08-18 DIAGNOSIS — J3081 Allergic rhinitis due to animal (cat) (dog) hair and dander: Secondary | ICD-10-CM | POA: Diagnosis not present

## 2014-08-18 DIAGNOSIS — I10 Essential (primary) hypertension: Secondary | ICD-10-CM | POA: Diagnosis not present

## 2014-08-18 DIAGNOSIS — F0390 Unspecified dementia without behavioral disturbance: Secondary | ICD-10-CM | POA: Diagnosis not present

## 2014-08-18 DIAGNOSIS — J3089 Other allergic rhinitis: Secondary | ICD-10-CM | POA: Diagnosis not present

## 2014-08-18 DIAGNOSIS — Z471 Aftercare following joint replacement surgery: Secondary | ICD-10-CM | POA: Diagnosis not present

## 2014-08-18 DIAGNOSIS — M160 Bilateral primary osteoarthritis of hip: Secondary | ICD-10-CM | POA: Diagnosis not present

## 2014-08-20 DIAGNOSIS — K219 Gastro-esophageal reflux disease without esophagitis: Secondary | ICD-10-CM | POA: Diagnosis not present

## 2014-08-20 DIAGNOSIS — I10 Essential (primary) hypertension: Secondary | ICD-10-CM | POA: Diagnosis not present

## 2014-08-20 DIAGNOSIS — M160 Bilateral primary osteoarthritis of hip: Secondary | ICD-10-CM | POA: Diagnosis not present

## 2014-08-20 DIAGNOSIS — Z471 Aftercare following joint replacement surgery: Secondary | ICD-10-CM | POA: Diagnosis not present

## 2014-08-20 DIAGNOSIS — J45909 Unspecified asthma, uncomplicated: Secondary | ICD-10-CM | POA: Diagnosis not present

## 2014-08-20 DIAGNOSIS — F0390 Unspecified dementia without behavioral disturbance: Secondary | ICD-10-CM | POA: Diagnosis not present

## 2014-08-21 DIAGNOSIS — J3081 Allergic rhinitis due to animal (cat) (dog) hair and dander: Secondary | ICD-10-CM | POA: Diagnosis not present

## 2014-08-21 DIAGNOSIS — J3089 Other allergic rhinitis: Secondary | ICD-10-CM | POA: Diagnosis not present

## 2014-08-21 DIAGNOSIS — Z471 Aftercare following joint replacement surgery: Secondary | ICD-10-CM | POA: Diagnosis not present

## 2014-08-21 DIAGNOSIS — Z96611 Presence of right artificial shoulder joint: Secondary | ICD-10-CM | POA: Diagnosis not present

## 2014-08-24 DIAGNOSIS — F0390 Unspecified dementia without behavioral disturbance: Secondary | ICD-10-CM | POA: Diagnosis not present

## 2014-08-24 DIAGNOSIS — I10 Essential (primary) hypertension: Secondary | ICD-10-CM | POA: Diagnosis not present

## 2014-08-24 DIAGNOSIS — J45909 Unspecified asthma, uncomplicated: Secondary | ICD-10-CM | POA: Diagnosis not present

## 2014-08-24 DIAGNOSIS — M160 Bilateral primary osteoarthritis of hip: Secondary | ICD-10-CM | POA: Diagnosis not present

## 2014-08-24 DIAGNOSIS — K219 Gastro-esophageal reflux disease without esophagitis: Secondary | ICD-10-CM | POA: Diagnosis not present

## 2014-08-24 DIAGNOSIS — Z471 Aftercare following joint replacement surgery: Secondary | ICD-10-CM | POA: Diagnosis not present

## 2014-08-25 DIAGNOSIS — J3089 Other allergic rhinitis: Secondary | ICD-10-CM | POA: Diagnosis not present

## 2014-08-25 DIAGNOSIS — J3081 Allergic rhinitis due to animal (cat) (dog) hair and dander: Secondary | ICD-10-CM | POA: Diagnosis not present

## 2014-08-26 DIAGNOSIS — F0390 Unspecified dementia without behavioral disturbance: Secondary | ICD-10-CM | POA: Diagnosis not present

## 2014-08-26 DIAGNOSIS — M160 Bilateral primary osteoarthritis of hip: Secondary | ICD-10-CM | POA: Diagnosis not present

## 2014-08-26 DIAGNOSIS — Z471 Aftercare following joint replacement surgery: Secondary | ICD-10-CM | POA: Diagnosis not present

## 2014-08-26 DIAGNOSIS — J45909 Unspecified asthma, uncomplicated: Secondary | ICD-10-CM | POA: Diagnosis not present

## 2014-08-26 DIAGNOSIS — K219 Gastro-esophageal reflux disease without esophagitis: Secondary | ICD-10-CM | POA: Diagnosis not present

## 2014-08-26 DIAGNOSIS — I10 Essential (primary) hypertension: Secondary | ICD-10-CM | POA: Diagnosis not present

## 2014-08-27 DIAGNOSIS — J45909 Unspecified asthma, uncomplicated: Secondary | ICD-10-CM | POA: Diagnosis not present

## 2014-08-27 DIAGNOSIS — M160 Bilateral primary osteoarthritis of hip: Secondary | ICD-10-CM | POA: Diagnosis not present

## 2014-08-27 DIAGNOSIS — I10 Essential (primary) hypertension: Secondary | ICD-10-CM | POA: Diagnosis not present

## 2014-08-27 DIAGNOSIS — Z471 Aftercare following joint replacement surgery: Secondary | ICD-10-CM | POA: Diagnosis not present

## 2014-08-27 DIAGNOSIS — F0390 Unspecified dementia without behavioral disturbance: Secondary | ICD-10-CM | POA: Diagnosis not present

## 2014-08-27 DIAGNOSIS — K219 Gastro-esophageal reflux disease without esophagitis: Secondary | ICD-10-CM | POA: Diagnosis not present

## 2014-08-28 DIAGNOSIS — M160 Bilateral primary osteoarthritis of hip: Secondary | ICD-10-CM | POA: Diagnosis not present

## 2014-08-28 DIAGNOSIS — Z471 Aftercare following joint replacement surgery: Secondary | ICD-10-CM | POA: Diagnosis not present

## 2014-08-28 DIAGNOSIS — K219 Gastro-esophageal reflux disease without esophagitis: Secondary | ICD-10-CM | POA: Diagnosis not present

## 2014-08-28 DIAGNOSIS — I10 Essential (primary) hypertension: Secondary | ICD-10-CM | POA: Diagnosis not present

## 2014-08-28 DIAGNOSIS — J45909 Unspecified asthma, uncomplicated: Secondary | ICD-10-CM | POA: Diagnosis not present

## 2014-08-28 DIAGNOSIS — F0390 Unspecified dementia without behavioral disturbance: Secondary | ICD-10-CM | POA: Diagnosis not present

## 2014-08-31 DIAGNOSIS — J45909 Unspecified asthma, uncomplicated: Secondary | ICD-10-CM | POA: Diagnosis not present

## 2014-08-31 DIAGNOSIS — Z471 Aftercare following joint replacement surgery: Secondary | ICD-10-CM | POA: Diagnosis not present

## 2014-08-31 DIAGNOSIS — F0390 Unspecified dementia without behavioral disturbance: Secondary | ICD-10-CM | POA: Diagnosis not present

## 2014-08-31 DIAGNOSIS — M160 Bilateral primary osteoarthritis of hip: Secondary | ICD-10-CM | POA: Diagnosis not present

## 2014-08-31 DIAGNOSIS — K219 Gastro-esophageal reflux disease without esophagitis: Secondary | ICD-10-CM | POA: Diagnosis not present

## 2014-08-31 DIAGNOSIS — I10 Essential (primary) hypertension: Secondary | ICD-10-CM | POA: Diagnosis not present

## 2014-09-01 ENCOUNTER — Encounter: Payer: Self-pay | Admitting: Orthopedic Surgery

## 2014-09-01 DIAGNOSIS — M25612 Stiffness of left shoulder, not elsewhere classified: Secondary | ICD-10-CM | POA: Diagnosis not present

## 2014-09-01 DIAGNOSIS — Z96619 Presence of unspecified artificial shoulder joint: Secondary | ICD-10-CM | POA: Diagnosis not present

## 2014-09-01 DIAGNOSIS — M6281 Muscle weakness (generalized): Secondary | ICD-10-CM | POA: Diagnosis not present

## 2014-09-01 DIAGNOSIS — J3089 Other allergic rhinitis: Secondary | ICD-10-CM | POA: Diagnosis not present

## 2014-09-01 DIAGNOSIS — J301 Allergic rhinitis due to pollen: Secondary | ICD-10-CM | POA: Diagnosis not present

## 2014-09-04 ENCOUNTER — Other Ambulatory Visit: Payer: Self-pay

## 2014-09-04 MED ORDER — OXYCODONE-ACETAMINOPHEN 5-325 MG PO TABS: 1.0000 | ORAL_TABLET | ORAL | Status: DC | PRN

## 2014-09-04 NOTE — Telephone Encounter (Signed)
Mr Aimee Jones left v/m; pt had shoulder replacement; Dr Onnie Graham prescribed oxycodone apap. Mr Ruan called Dr Augustin Coupe office for refill and was advised to let PCP manage pain medication. Pt does not have enough pain med to last thru the weekend and request to pick up oxycodone apap today.Please advise; call when ready for pick up.

## 2014-09-04 NOTE — Telephone Encounter (Signed)
Spoke to pt and informed her Rx is available for pickup from the front desk. Pt advised third party unable to pickup Rx

## 2014-09-04 NOTE — Telephone Encounter (Signed)
Printed.  Thanks.  

## 2014-09-07 DIAGNOSIS — Z96619 Presence of unspecified artificial shoulder joint: Secondary | ICD-10-CM | POA: Diagnosis not present

## 2014-09-07 DIAGNOSIS — M6281 Muscle weakness (generalized): Secondary | ICD-10-CM | POA: Diagnosis not present

## 2014-09-07 DIAGNOSIS — M25612 Stiffness of left shoulder, not elsewhere classified: Secondary | ICD-10-CM | POA: Diagnosis not present

## 2014-09-08 DIAGNOSIS — J3081 Allergic rhinitis due to animal (cat) (dog) hair and dander: Secondary | ICD-10-CM | POA: Diagnosis not present

## 2014-09-08 DIAGNOSIS — J3089 Other allergic rhinitis: Secondary | ICD-10-CM | POA: Diagnosis not present

## 2014-09-15 ENCOUNTER — Encounter: Payer: Self-pay | Admitting: Orthopedic Surgery

## 2014-09-15 DIAGNOSIS — Z96619 Presence of unspecified artificial shoulder joint: Secondary | ICD-10-CM | POA: Diagnosis not present

## 2014-09-15 DIAGNOSIS — M6281 Muscle weakness (generalized): Secondary | ICD-10-CM | POA: Diagnosis not present

## 2014-09-15 DIAGNOSIS — J3089 Other allergic rhinitis: Secondary | ICD-10-CM | POA: Diagnosis not present

## 2014-09-15 DIAGNOSIS — M25612 Stiffness of left shoulder, not elsewhere classified: Secondary | ICD-10-CM | POA: Diagnosis not present

## 2014-09-15 DIAGNOSIS — J3081 Allergic rhinitis due to animal (cat) (dog) hair and dander: Secondary | ICD-10-CM | POA: Diagnosis not present

## 2014-09-16 ENCOUNTER — Other Ambulatory Visit: Payer: Self-pay | Admitting: *Deleted

## 2014-09-16 MED ORDER — NABUMETONE 750 MG PO TABS: 750.0000 mg | ORAL_TABLET | Freq: Every day | ORAL | Status: DC

## 2014-09-16 MED ORDER — OLMESARTAN MEDOXOMIL-HCTZ 40-25 MG PO TABS: 0.5000 | ORAL_TABLET | Freq: Every day | ORAL | Status: DC

## 2014-09-16 MED ORDER — DOXEPIN HCL 25 MG PO CAPS: ORAL_CAPSULE | ORAL | Status: DC

## 2014-09-16 NOTE — Telephone Encounter (Signed)
Doxepin last filled 07/13/14, Nabumetone last filled 07/18/14, ok to refill?

## 2014-09-16 NOTE — Telephone Encounter (Signed)
Both sent.  Thanks.  

## 2014-09-17 DIAGNOSIS — Z96619 Presence of unspecified artificial shoulder joint: Secondary | ICD-10-CM | POA: Diagnosis not present

## 2014-09-17 DIAGNOSIS — M6281 Muscle weakness (generalized): Secondary | ICD-10-CM | POA: Diagnosis not present

## 2014-09-17 DIAGNOSIS — M25612 Stiffness of left shoulder, not elsewhere classified: Secondary | ICD-10-CM | POA: Diagnosis not present

## 2014-09-22 DIAGNOSIS — M6281 Muscle weakness (generalized): Secondary | ICD-10-CM | POA: Diagnosis not present

## 2014-09-22 DIAGNOSIS — Z96619 Presence of unspecified artificial shoulder joint: Secondary | ICD-10-CM | POA: Diagnosis not present

## 2014-09-22 DIAGNOSIS — M25612 Stiffness of left shoulder, not elsewhere classified: Secondary | ICD-10-CM | POA: Diagnosis not present

## 2014-09-22 DIAGNOSIS — J3089 Other allergic rhinitis: Secondary | ICD-10-CM | POA: Diagnosis not present

## 2014-09-22 DIAGNOSIS — J3081 Allergic rhinitis due to animal (cat) (dog) hair and dander: Secondary | ICD-10-CM | POA: Diagnosis not present

## 2014-09-24 DIAGNOSIS — Z96619 Presence of unspecified artificial shoulder joint: Secondary | ICD-10-CM | POA: Diagnosis not present

## 2014-09-24 DIAGNOSIS — M6281 Muscle weakness (generalized): Secondary | ICD-10-CM | POA: Diagnosis not present

## 2014-09-24 DIAGNOSIS — M25612 Stiffness of left shoulder, not elsewhere classified: Secondary | ICD-10-CM | POA: Diagnosis not present

## 2014-09-25 ENCOUNTER — Other Ambulatory Visit: Payer: Self-pay

## 2014-09-25 NOTE — Telephone Encounter (Signed)
Mr Aimee Jones left v/m; pt recently had shoulder replacement and pt is still taking PT until end of next week; after end of next week pt will be going out of town to [place] for 3 weeks. Mr Aimee Jones request written rx for oxycodone to take with them on trip in case needs pain med while out of town. Pt still has approx 20 pills from last refill of oxycodone apap on 09/04/14. Mr Aimee Jones request cb.

## 2014-09-27 MED ORDER — OXYCODONE-ACETAMINOPHEN 5-325 MG PO TABS: 1.0000 | ORAL_TABLET | ORAL | Status: DC | PRN

## 2014-09-27 NOTE — Telephone Encounter (Signed)
Printed, use sparingly.

## 2014-09-28 NOTE — Telephone Encounter (Signed)
Patient advised.  Rx left at front desk for pick up. 

## 2014-09-29 DIAGNOSIS — J3089 Other allergic rhinitis: Secondary | ICD-10-CM | POA: Diagnosis not present

## 2014-09-29 DIAGNOSIS — J3081 Allergic rhinitis due to animal (cat) (dog) hair and dander: Secondary | ICD-10-CM | POA: Diagnosis not present

## 2014-10-01 DIAGNOSIS — M25612 Stiffness of left shoulder, not elsewhere classified: Secondary | ICD-10-CM | POA: Diagnosis not present

## 2014-10-01 DIAGNOSIS — M6281 Muscle weakness (generalized): Secondary | ICD-10-CM | POA: Diagnosis not present

## 2014-10-01 DIAGNOSIS — Z96619 Presence of unspecified artificial shoulder joint: Secondary | ICD-10-CM | POA: Diagnosis not present

## 2014-10-03 DIAGNOSIS — R05 Cough: Secondary | ICD-10-CM | POA: Diagnosis not present

## 2014-10-03 DIAGNOSIS — J0100 Acute maxillary sinusitis, unspecified: Secondary | ICD-10-CM | POA: Diagnosis not present

## 2014-10-12 ENCOUNTER — Encounter (HOSPITAL_COMMUNITY): Payer: Self-pay | Admitting: *Deleted

## 2014-10-12 ENCOUNTER — Inpatient Hospital Stay (HOSPITAL_COMMUNITY)
Admission: EM | Admit: 2014-10-12 | Discharge: 2014-10-14 | DRG: 066 | Disposition: A | Discharge status: Home / Self Care | Payer: Medicare Other | Attending: Internal Medicine | Admitting: Internal Medicine

## 2014-10-12 ENCOUNTER — Inpatient Hospital Stay (HOSPITAL_COMMUNITY): Payer: Medicare Other

## 2014-10-12 ENCOUNTER — Emergency Department (HOSPITAL_COMMUNITY): Payer: Medicare Other

## 2014-10-12 DIAGNOSIS — R531 Weakness: Secondary | ICD-10-CM | POA: Diagnosis not present

## 2014-10-12 DIAGNOSIS — J9811 Atelectasis: Secondary | ICD-10-CM | POA: Diagnosis not present

## 2014-10-12 DIAGNOSIS — F419 Anxiety disorder, unspecified: Secondary | ICD-10-CM | POA: Diagnosis present

## 2014-10-12 DIAGNOSIS — I10 Essential (primary) hypertension: Secondary | ICD-10-CM | POA: Diagnosis not present

## 2014-10-12 DIAGNOSIS — K219 Gastro-esophageal reflux disease without esophagitis: Secondary | ICD-10-CM | POA: Diagnosis not present

## 2014-10-12 DIAGNOSIS — M1990 Unspecified osteoarthritis, unspecified site: Secondary | ICD-10-CM | POA: Diagnosis present

## 2014-10-12 DIAGNOSIS — I6330 Cerebral infarction due to thrombosis of unspecified cerebral artery: Secondary | ICD-10-CM | POA: Diagnosis not present

## 2014-10-12 DIAGNOSIS — J45909 Unspecified asthma, uncomplicated: Secondary | ICD-10-CM | POA: Diagnosis present

## 2014-10-12 DIAGNOSIS — R29810 Facial weakness: Secondary | ICD-10-CM | POA: Diagnosis not present

## 2014-10-12 DIAGNOSIS — F329 Major depressive disorder, single episode, unspecified: Secondary | ICD-10-CM | POA: Diagnosis present

## 2014-10-12 DIAGNOSIS — E785 Hyperlipidemia, unspecified: Secondary | ICD-10-CM | POA: Insufficient documentation

## 2014-10-12 DIAGNOSIS — R059 Cough, unspecified: Secondary | ICD-10-CM

## 2014-10-12 DIAGNOSIS — J209 Acute bronchitis, unspecified: Secondary | ICD-10-CM | POA: Diagnosis present

## 2014-10-12 DIAGNOSIS — N183 Chronic kidney disease, stage 3 unspecified: Secondary | ICD-10-CM | POA: Diagnosis present

## 2014-10-12 DIAGNOSIS — Z880 Allergy status to penicillin: Secondary | ICD-10-CM | POA: Diagnosis not present

## 2014-10-12 DIAGNOSIS — R42 Dizziness and giddiness: Secondary | ICD-10-CM | POA: Diagnosis not present

## 2014-10-12 DIAGNOSIS — R05 Cough: Secondary | ICD-10-CM

## 2014-10-12 DIAGNOSIS — I129 Hypertensive chronic kidney disease with stage 1 through stage 4 chronic kidney disease, or unspecified chronic kidney disease: Secondary | ICD-10-CM | POA: Diagnosis present

## 2014-10-12 DIAGNOSIS — F0390 Unspecified dementia without behavioral disturbance: Secondary | ICD-10-CM | POA: Diagnosis present

## 2014-10-12 DIAGNOSIS — I639 Cerebral infarction, unspecified: Principal | ICD-10-CM | POA: Diagnosis present

## 2014-10-12 DIAGNOSIS — R0689 Other abnormalities of breathing: Secondary | ICD-10-CM | POA: Diagnosis not present

## 2014-10-12 DIAGNOSIS — Z7982 Long term (current) use of aspirin: Secondary | ICD-10-CM

## 2014-10-12 DIAGNOSIS — I369 Nonrheumatic tricuspid valve disorder, unspecified: Secondary | ICD-10-CM | POA: Diagnosis not present

## 2014-10-12 LAB — URINALYSIS, ROUTINE W REFLEX MICROSCOPIC
Bilirubin Urine: NEGATIVE
Glucose, UA: NEGATIVE mg/dL
Hgb urine dipstick: NEGATIVE
Ketones, ur: NEGATIVE mg/dL
NITRITE: NEGATIVE
Protein, ur: NEGATIVE mg/dL
SPECIFIC GRAVITY, URINE: 1.02 (ref 1.005–1.030)
Urobilinogen, UA: 0.2 mg/dL (ref 0.0–1.0)
pH: 5 (ref 5.0–8.0)

## 2014-10-12 LAB — URINE MICROSCOPIC-ADD ON

## 2014-10-12 LAB — COMPREHENSIVE METABOLIC PANEL
ALT: 14 U/L (ref 0–35)
AST: 25 U/L (ref 0–37)
Albumin: 3.5 g/dL (ref 3.5–5.2)
Alkaline Phosphatase: 58 U/L (ref 39–117)
Anion gap: 8 (ref 5–15)
BUN: 33 mg/dL — ABNORMAL HIGH (ref 6–23)
CO2: 22 mmol/L (ref 19–32)
Calcium: 9.8 mg/dL (ref 8.4–10.5)
Chloride: 104 mEq/L (ref 96–112)
Creatinine, Ser: 1.37 mg/dL — ABNORMAL HIGH (ref 0.50–1.10)
GFR calc Af Amer: 44 mL/min — ABNORMAL LOW (ref >90)
GFR calc non Af Amer: 38 mL/min — ABNORMAL LOW (ref >90)
GLUCOSE: 110 mg/dL — AB (ref 70–99)
Potassium: 3.9 mmol/L (ref 3.5–5.1)
SODIUM: 134 mmol/L — AB (ref 135–145)
Total Bilirubin: 0.7 mg/dL (ref 0.3–1.2)
Total Protein: 6.8 g/dL (ref 6.0–8.3)

## 2014-10-12 LAB — DIFFERENTIAL
BASOS ABS: 0.1 10*3/uL (ref 0.0–0.1)
Basophils Relative: 0 % (ref 0–1)
EOS ABS: 0.9 10*3/uL — AB (ref 0.0–0.7)
Eosinophils Relative: 4 % (ref 0–5)
LYMPHS ABS: 4.5 10*3/uL — AB (ref 0.7–4.0)
Lymphocytes Relative: 19 % (ref 12–46)
Monocytes Absolute: 1 10*3/uL (ref 0.1–1.0)
Monocytes Relative: 4 % (ref 3–12)
Neutro Abs: 16.7 10*3/uL — ABNORMAL HIGH (ref 1.7–7.7)
Neutrophils Relative %: 72 % (ref 43–77)

## 2014-10-12 LAB — I-STAT CHEM 8, ED
BUN: 36 mg/dL — ABNORMAL HIGH (ref 6–23)
CHLORIDE: 104 meq/L (ref 96–112)
Calcium, Ion: 1.29 mmol/L (ref 1.13–1.30)
Creatinine, Ser: 1.4 mg/dL — ABNORMAL HIGH (ref 0.50–1.10)
Glucose, Bld: 111 mg/dL — ABNORMAL HIGH (ref 70–99)
HEMATOCRIT: 46 % (ref 36.0–46.0)
Hemoglobin: 15.6 g/dL — ABNORMAL HIGH (ref 12.0–15.0)
POTASSIUM: 3.9 mmol/L (ref 3.5–5.1)
Sodium: 137 mmol/L (ref 135–145)
TCO2: 19 mmol/L (ref 0–100)

## 2014-10-12 LAB — CBC
HCT: 39.3 % (ref 36.0–46.0)
Hemoglobin: 12.3 g/dL (ref 12.0–15.0)
MCH: 25.3 pg — AB (ref 26.0–34.0)
MCHC: 31.3 g/dL (ref 30.0–36.0)
MCV: 80.7 fL (ref 78.0–100.0)
Platelets: 543 10*3/uL — ABNORMAL HIGH (ref 150–400)
RBC: 4.87 MIL/uL (ref 3.87–5.11)
RDW: 16.4 % — AB (ref 11.5–15.5)
WBC: 23.1 10*3/uL — ABNORMAL HIGH (ref 4.0–10.5)

## 2014-10-12 LAB — PROTIME-INR
INR: 1.07 (ref 0.00–1.49)
PROTHROMBIN TIME: 14 s (ref 11.6–15.2)

## 2014-10-12 LAB — HEMOGLOBIN A1C
HEMOGLOBIN A1C: 5.9 % — AB (ref <5.7)
MEAN PLASMA GLUCOSE: 123 mg/dL — AB (ref <117)

## 2014-10-12 LAB — ETHANOL

## 2014-10-12 LAB — I-STAT TROPONIN, ED: TROPONIN I, POC: 0 ng/mL (ref 0.00–0.08)

## 2014-10-12 LAB — RAPID URINE DRUG SCREEN, HOSP PERFORMED
Amphetamines: NOT DETECTED
BARBITURATES: NOT DETECTED
BENZODIAZEPINES: POSITIVE — AB
Cocaine: NOT DETECTED
Opiates: POSITIVE — AB
Tetrahydrocannabinol: NOT DETECTED

## 2014-10-12 LAB — APTT: aPTT: 31 seconds (ref 24–37)

## 2014-10-12 MED ORDER — ASPIRIN 81 MG PO TABS: 81.0000 mg | ORAL_TABLET | Freq: Every day | ORAL | Status: DC

## 2014-10-12 MED ORDER — CITALOPRAM HYDROBROMIDE 40 MG PO TABS
40.0000 mg | ORAL_TABLET | Freq: Every day | ORAL | Status: DC
  Administered 2014-10-12 – 2014-10-13 (×2): 40 mg via ORAL
  Filled 2014-10-12 (×2): qty 1

## 2014-10-12 MED ORDER — FLUTICASONE PROPIONATE 50 MCG/ACT NA SUSP
2.0000 | Freq: Every day | NASAL | Status: DC
  Administered 2014-10-13: 2 via NASAL
  Filled 2014-10-12: qty 16

## 2014-10-12 MED ORDER — OLMESARTAN MEDOXOMIL-HCTZ 40-25 MG PO TABS: 1.0000 | ORAL_TABLET | Freq: Every day | ORAL | Status: DC

## 2014-10-12 MED ORDER — SODIUM CHLORIDE 0.9 % IV SOLN: INTRAVENOUS | Status: DC

## 2014-10-12 MED ORDER — GUAIFENESIN-DM 100-10 MG/5ML PO SYRP: 5.0000 mL | ORAL_SOLUTION | ORAL | Status: DC | PRN

## 2014-10-12 MED ORDER — IPRATROPIUM-ALBUTEROL 0.5-2.5 (3) MG/3ML IN SOLN
3.0000 mL | Freq: Two times a day (BID) | RESPIRATORY_TRACT | Status: DC
  Administered 2014-10-13 (×2): 3 mL via RESPIRATORY_TRACT
  Filled 2014-10-12 (×3): qty 3

## 2014-10-12 MED ORDER — OXYCODONE-ACETAMINOPHEN 5-325 MG PO TABS
1.0000 | ORAL_TABLET | ORAL | Status: DC | PRN
  Administered 2014-10-12: 2 via ORAL
  Administered 2014-10-12 – 2014-10-13 (×2): 1 via ORAL
  Administered 2014-10-13: 2 via ORAL
  Administered 2014-10-13: 1 via ORAL
  Administered 2014-10-13: 2 via ORAL
  Filled 2014-10-12: qty 2
  Filled 2014-10-12: qty 1
  Filled 2014-10-12 (×3): qty 2

## 2014-10-12 MED ORDER — GUAIFENESIN ER 600 MG PO TB12
1200.0000 mg | ORAL_TABLET | Freq: Two times a day (BID) | ORAL | Status: DC
  Administered 2014-10-12 – 2014-10-13 (×3): 1200 mg via ORAL
  Filled 2014-10-12 (×3): qty 2

## 2014-10-12 MED ORDER — DEXTROSE 5 % IV SOLN
1.0000 g | INTRAVENOUS | Status: DC
  Administered 2014-10-13: 1 g via INTRAVENOUS
  Filled 2014-10-12 (×2): qty 10

## 2014-10-12 MED ORDER — ALPRAZOLAM 0.5 MG PO TABS
0.5000 mg | ORAL_TABLET | Freq: Three times a day (TID) | ORAL | Status: DC | PRN
  Administered 2014-10-12 – 2014-10-13 (×3): 0.5 mg via ORAL
  Filled 2014-10-12 (×3): qty 1

## 2014-10-12 MED ORDER — STROKE: EARLY STAGES OF RECOVERY BOOK
Freq: Once | Status: AC
  Administered 2014-10-12: 23:00:00
  Filled 2014-10-12: qty 1

## 2014-10-12 MED ORDER — ASPIRIN EC 81 MG PO TBEC
81.0000 mg | DELAYED_RELEASE_TABLET | Freq: Every day | ORAL | Status: DC
  Administered 2014-10-12: 81 mg via ORAL
  Filled 2014-10-12: qty 1

## 2014-10-12 MED ORDER — ATORVASTATIN CALCIUM 10 MG PO TABS
10.0000 mg | ORAL_TABLET | Freq: Every day | ORAL | Status: DC
  Administered 2014-10-13: 10 mg via ORAL

## 2014-10-12 MED ORDER — IRBESARTAN 300 MG PO TABS: 300.0000 mg | ORAL_TABLET | Freq: Every day | ORAL | Status: DC

## 2014-10-12 MED ORDER — HEPARIN SODIUM (PORCINE) 5000 UNIT/ML IJ SOLN
5000.0000 [IU] | Freq: Three times a day (TID) | INTRAMUSCULAR | Status: DC
  Administered 2014-10-12 – 2014-10-13 (×4): 5000 [IU] via SUBCUTANEOUS
  Filled 2014-10-12 (×5): qty 1

## 2014-10-12 MED ORDER — DIPHENHYDRAMINE HCL 12.5 MG/5ML PO ELIX
12.5000 mg | ORAL_SOLUTION | Freq: Once | ORAL | Status: AC
  Administered 2014-10-12: 12.5 mg via ORAL
  Filled 2014-10-12: qty 10

## 2014-10-12 MED ORDER — HYDROCHLOROTHIAZIDE 25 MG PO TABS
25.0000 mg | ORAL_TABLET | Freq: Every day | ORAL | Status: DC
  Administered 2014-10-13: 25 mg via ORAL
  Filled 2014-10-12 (×2): qty 1

## 2014-10-12 MED ORDER — LORATADINE 10 MG PO TABS
10.0000 mg | ORAL_TABLET | Freq: Every day | ORAL | Status: DC
  Administered 2014-10-12 – 2014-10-13 (×2): 10 mg via ORAL
  Filled 2014-10-12 (×2): qty 1

## 2014-10-12 MED ORDER — IPRATROPIUM-ALBUTEROL 0.5-2.5 (3) MG/3ML IN SOLN
3.0000 mL | RESPIRATORY_TRACT | Status: DC
  Administered 2014-10-12: 3 mL via RESPIRATORY_TRACT
  Filled 2014-10-12: qty 3

## 2014-10-12 MED ORDER — SENNOSIDES-DOCUSATE SODIUM 8.6-50 MG PO TABS: 1.0000 | ORAL_TABLET | Freq: Every evening | ORAL | Status: DC | PRN

## 2014-10-12 MED ORDER — IRBESARTAN 300 MG PO TABS
300.0000 mg | ORAL_TABLET | Freq: Every day | ORAL | Status: DC
  Administered 2014-10-12 – 2014-10-13 (×2): 300 mg via ORAL
  Filled 2014-10-12 (×3): qty 1

## 2014-10-12 MED ORDER — HYDROCHLOROTHIAZIDE 25 MG PO TABS: 25.0000 mg | ORAL_TABLET | Freq: Every day | ORAL | Status: DC

## 2014-10-12 MED ORDER — ASPIRIN 81 MG PO CHEW
81.0000 mg | CHEWABLE_TABLET | Freq: Every day | ORAL | Status: DC
  Administered 2014-10-12 – 2014-10-13 (×2): 81 mg via ORAL
  Filled 2014-10-12 (×2): qty 1

## 2014-10-12 MED ORDER — DEXTROSE 5 % IV SOLN
500.0000 mg | INTRAVENOUS | Status: DC
  Administered 2014-10-13: 500 mg via INTRAVENOUS
  Filled 2014-10-12 (×2): qty 500

## 2014-10-12 MED ORDER — IPRATROPIUM-ALBUTEROL 0.5-2.5 (3) MG/3ML IN SOLN
3.0000 mL | RESPIRATORY_TRACT | Status: DC | PRN
Start: 2014-10-12 — End: 2014-10-14

## 2014-10-12 NOTE — ED Notes (Signed)
Dr Hartford Poli at bedside

## 2014-10-12 NOTE — ED Notes (Signed)
Per husband; at 1030am pt became dizzy and almost fell, husband had pt stick out tongue and was normal and then when had pt smile, noticed facial droop; pt has small facial droop in R smile; grips equal, no drift noted; [place] PA came to evaluate

## 2014-10-12 NOTE — Code Documentation (Signed)
This morning patient at home with husband.  She suddenly became dizzy and almost fell at 1030. She also complained of right hand numbness.  He brought her to the hospital.  RN noted facial droop upon arrival, Code Stroke called at 1130.  Stat head Ct done and labs drawn.  NIHSS 2, missed month and has facial droop.  Dr Armida Sans at bedside to assess patient.   She states that she has a history of asthma and has had a cough for 5 weeks recently with green secretions.    Plan admit for stroke work up

## 2014-10-12 NOTE — ED Provider Notes (Addendum)
CSN: [id number]     Arrival date & time 10/12/14  1115 History   None    Chief Complaint  Patient presents with  . Dizziness  . Facial Droop    An emergency department physician performed an initial assessment on this suspected stroke patient at 1125. (Consider location/radiation/quality/duration/timing/severity/associated sxs/prior Treatment) HPI Comments: 68 year old female who presents with sudden onset right facial weakness, slurred speech, right hand weakness, and difficulty ambulating.  Symptoms began approximately 10:15 this morning. Witnessed by her husband.  Patient is a 68 y.o. female presenting with neurologic complaint.  Neurologic Problem This is a new problem. Episode onset: about 1:15 minutes PTA. The problem occurs constantly. The problem has been gradually improving. Pertinent negatives include no chest pain and no shortness of breath. The symptoms are aggravated by walking. Nothing relieves the symptoms.    Past Medical History  Diagnosis Date  . Hyperlipidemia   . ETOH abuse   . Allergy   . Diverticulosis   . Hypertension   . Depression   . Asthma   . GERD (gastroesophageal reflux disease)   . Normal 24 hour ambulatory pH monitoring study     good acid suppression  . Dementia   . Shortness of breath   . Anxiety     panic attacks- on occas.   . Arthritis     osteoporosis - especially hips.    Past Surgical History  Procedure Laterality Date  . Dobutamine stress echo  07/09/2001    normal  . Esophagogastroduodenoscopy  06/11/2006    normal  . Abdominal hysterectomy    . Total hip arthroplasty      left 1991/right 2008  . Nasal sinus surgery  1990  . Eye surgery      cataracts removed, IOL- both eyes   . Breast surgery      3x- biposies   . Joint replacement    . Shoulder arthroscopy Left   . Reverse shoulder arthroplasty Right 08/13/2014    Procedure: REVERSE SHOULDER ARTHROPLASTY;  Surgeon: Marin Shutter, MD;  Location: [place];  Service:  Orthopedics;  Laterality: Right;  interscalene block   No family history on file. History  Substance Use Topics  . Smoking status: Never Smoker   . Smokeless tobacco: Never Used  . Alcohol Use: Yes     Comment: grand Mariener - almost daily- 1/2 shot   OB History    No data available     Review of Systems  Respiratory: Negative for shortness of breath.   Cardiovascular: Negative for chest pain.  All other systems reviewed and are negative.     Allergies  Latex and Penicillins  Home Medications   Prior to Admission medications   Medication Sig Start Date End Date Taking? Authorizing Provider  aspirin 81 MG tablet Take 81 mg by mouth daily.    Historical Provider, MD  atorvastatin (LIPITOR) 10 MG tablet Take 10 mg by mouth daily at 6 PM.     Historical Provider, MD  B Complex-C (SUPER B COMPLEX PO) Take 150 mg by mouth daily.     Historical Provider, MD  budesonide-formoterol (SYMBICORT) 160-4.5 MCG/ACT inhaler Inhale 2 puffs into the lungs 2 (two) times daily as needed (shortness of breath). 08/12/14   Tonia Ghent, MD  citalopram (CELEXA) 40 MG tablet Take 40 mg by mouth at bedtime.     Historical Provider, MD  diazepam (VALIUM) 5 MG tablet Take 0.5-1 tablets (2.5-5 mg total) by mouth every 6 (six) hours  as needed for muscle spasms or sedation. 08/14/14   Tracy Shuford, PA-C  donepezil (ARICEPT) 10 MG tablet Take 10 mg by mouth daily before breakfast.     Historical Provider, MD  doxepin (SINEQUAN) 25 MG capsule TAKE 1 CAPSULE BY MOUTH DAILY AT BEDTIME 09/16/14   Tonia Ghent, MD  esomeprazole (NEXIUM) 40 MG capsule Take 40 mg by mouth daily at 12 noon.    Historical Provider, MD  fexofenadine (ALLEGRA) 180 MG tablet Take 180 mg by mouth daily before breakfast.     Historical Provider, MD  fish oil-omega-3 fatty acids 1000 MG capsule Take 1 g by mouth daily.     Historical Provider, MD  fluticasone (FLONASE) 50 MCG/ACT nasal spray Place 2 sprays into both nostrils daily.     Historical Provider, MD  montelukast (SINGULAIR) 10 MG tablet Take 10 mg by mouth at bedtime.  02/21/13   Historical Provider, MD  Multiple Vitamins-Minerals (MULTIVITAMIN PO) Take 1 tablet by mouth daily.    Historical Provider, MD  nabumetone (RELAFEN) 750 MG tablet Take 1 tablet (750 mg total) by mouth daily. With food. 09/16/14   Tonia Ghent, MD  naproxen sodium (ALEVE) 220 MG tablet Take 220 mg by mouth 3 (three) times daily with meals.    Historical Provider, MD  olmesartan-hydrochlorothiazide (BENICAR HCT) 40-25 MG per tablet Take 0.5 tablets by mouth at bedtime. 09/16/14   Tonia Ghent, MD  oxyCODONE-acetaminophen (PERCOCET) 5-325 MG per tablet Take 1-2 tablets by mouth every 4 (four) hours as needed. 09/27/14   Tonia Ghent, MD  thiamine (VITAMIN B-1) 100 MG tablet Take 100 mg by mouth daily.    Historical Provider, MD  VITAMIN E PO Take 1 capsule by mouth daily.     Historical Provider, MD   BP 101/65 mmHg  Pulse 89  Temp(Src) 98.2 F (36.8 C) (Oral)  Resp 20  SpO2 92% Physical Exam  Constitutional: She is oriented to person, place, and time. She appears well-developed and well-nourished. No distress.  HENT:  Head: Normocephalic and atraumatic.  Eyes: Conjunctivae are normal. No scleral icterus.  Neck: Neck supple.  Cardiovascular: Normal rate and intact distal pulses.   Pulmonary/Chest: Effort normal. No stridor. No respiratory distress.  Abdominal: Normal appearance. She exhibits no distension.  Neurological: She is alert and oriented to person, place, and time. GCS eye subscore is 4. GCS verbal subscore is 5. GCS motor subscore is 6.  Skin: Skin is warm and dry. No rash noted.  Psychiatric: She has a normal mood and affect. Her behavior is normal.  Nursing note and vitals reviewed.   ED Course  CRITICAL CARE Performed by: Cyd Silence DAVID Authorized by: Cyd Silence DAVID Total critical care time: 30 minutes Critical care time was exclusive of  separately billable procedures and treating other patients. Critical care was necessary to treat or prevent imminent or life-threatening deterioration of the following conditions: CNS failure or compromise. Critical care was time spent personally by me on the following activities: development of treatment plan with patient or surrogate, discussions with consultants, evaluation of patient's response to treatment, examination of patient, obtaining history from patient or surrogate, ordering and performing treatments and interventions, ordering and review of laboratory studies, ordering and review of radiographic studies, pulse oximetry, re-evaluation of patient's condition and review of old charts.   (including critical care time) Labs Review Labs Reviewed  CBC - Abnormal; Notable for the following:    WBC 23.1 (*)  MCH 25.3 (*)    RDW 16.4 (*)    Platelets 543 (*)    All other components within normal limits  DIFFERENTIAL - Abnormal; Notable for the following:    Neutro Abs 16.7 (*)    Lymphs Abs 4.5 (*)    Eosinophils Absolute 0.9 (*)    All other components within normal limits  COMPREHENSIVE METABOLIC PANEL - Abnormal; Notable for the following:    Sodium 134 (*)    Glucose, Bld 110 (*)    BUN 33 (*)    Creatinine, Ser 1.37 (*)    GFR calc non Af Amer 38 (*)    GFR calc Af Amer 44 (*)    All other components within normal limits  URINE RAPID DRUG SCREEN (HOSP PERFORMED) - Abnormal; Notable for the following:    Opiates POSITIVE (*)    Benzodiazepines POSITIVE (*)    All other components within normal limits  URINALYSIS, ROUTINE W REFLEX MICROSCOPIC - Abnormal; Notable for the following:    Leukocytes, UA MODERATE (*)    All other components within normal limits  URINE MICROSCOPIC-ADD ON - Abnormal; Notable for the following:    Bacteria, UA FEW (*)    Casts HYALINE CASTS (*)    All other components within normal limits  I-STAT CHEM 8, ED - Abnormal; Notable for the following:     BUN 36 (*)    Creatinine, Ser 1.40 (*)    Glucose, Bld 111 (*)    Hemoglobin 15.6 (*)    All other components within normal limits  ETHANOL  PROTIME-INR  APTT  HEMOGLOBIN A1C  LIPID PANEL  I-STAT TROPOININ, ED  I-STAT CHEM 8, ED  I-STAT TROPOININ, ED  I-STAT TROPOININ, ED    Imaging Review Ct Head Wo Contrast  10/12/2014   CLINICAL DATA:  Code stroke.  Dizziness.  Right facial droop.  EXAM: CT HEAD WITHOUT CONTRAST  TECHNIQUE: Contiguous axial images were obtained from the base of the skull through the vertex without intravenous contrast.  COMPARISON:  Brain MRI 05/29/2014  FINDINGS: Atrophy and chronic small vessel ischemic change, stable from prior. No intracranial hemorrhage, mass effect, or midline shift. No hydrocephalus. The basilar cisterns are patent. No evidence of territorial infarct. No intracranial fluid collection. Calvarium is intact. Included paranasal sinuses and mastoid air cells are well aerated.  IMPRESSION: Stable atrophy and chronic small vessel ischemic change. No evidence of acute intracranial abnormality.  These results were called by telephone at the time of interpretation on 10/12/2014 at 11:53 am to Dr. Dr Armida Sans , who verbally acknowledged these results.   Electronically Signed   By: Jeb Levering M.D.   On: 10/12/2014 11:55   Mr Jodene Nam Head Wo Contrast  10/12/2014   CLINICAL DATA:  Stroke.  Hypertension and hyperlipidemia  EXAM: MRI HEAD WITHOUT CONTRAST  MRA HEAD WITHOUT CONTRAST  TECHNIQUE: Multiplanar, multiecho pulse sequences of the brain and surrounding structures were obtained without intravenous contrast. Angiographic images of the head were obtained using MRA technique without contrast.  COMPARISON:  CT head 10/12/2014  FINDINGS: MRI HEAD FINDINGS  Small areas of acute infarct in the left occipital cortex. Probable small area of acute infarct in the right occipital cortex. No other areas of acute infarct.  Generalized atrophy. Moderate chronic  microvascular ischemic changes similar to the prior MRI.  Negative for hemorrhage  Negative for mass or edema.  No shift of the midline structures  Minimal mucosal edema in the paranasal sinuses.  MRA HEAD  FINDINGS  Both vertebral arteries widely patent. PICA patent bilaterally. Superior cerebellar, and posterior cerebral arteries widely patent. Basilar widely patent.  Internal carotid artery widely patent bilaterally. Anterior and middle cerebral arteries widely patent bilaterally.  Negative for cerebral aneurysm.  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  Atrophy and chronic microvascular ischemic changes are stable from the prior MRI  Negative MRA head.   Electronically Signed   By: Franchot Gallo M.D.   On: 10/12/2014 14:49   Mr Brain Wo Contrast  10/12/2014   CLINICAL DATA:  Stroke.  Hypertension and hyperlipidemia  EXAM: MRI HEAD WITHOUT CONTRAST  MRA HEAD WITHOUT CONTRAST  TECHNIQUE: Multiplanar, multiecho pulse sequences of the brain and surrounding structures were obtained without intravenous contrast. Angiographic images of the head were obtained using MRA technique without contrast.  COMPARISON:  CT head 10/12/2014  FINDINGS: MRI HEAD FINDINGS  Small areas of acute infarct in the left occipital cortex. Probable small area of acute infarct in the right occipital cortex. No other areas of acute infarct.  Generalized atrophy. Moderate chronic microvascular ischemic changes similar to the prior MRI.  Negative for hemorrhage  Negative for mass or edema.  No shift of the midline structures  Minimal mucosal edema in the paranasal sinuses.  MRA HEAD FINDINGS  Both vertebral arteries widely patent. PICA patent bilaterally. Superior cerebellar, and posterior cerebral arteries widely patent. Basilar widely patent.  Internal carotid artery widely patent bilaterally. Anterior and middle cerebral arteries widely patent bilaterally.  Negative for cerebral  aneurysm.  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  Atrophy and chronic microvascular ischemic changes are stable from the prior MRI  Negative MRA head.   Electronically Signed   By: Franchot Gallo M.D.   On: 10/12/2014 14:49     EKG Interpretation   Date/Time:  Monday October 12 2014 11:55:23 EST Ventricular Rate:  86 PR Interval:  195 QRS Duration: 98 QT Interval:  382 QTC Calculation: 457 R Axis:   60 Text Interpretation:  Sinus rhythm Consider right atrial enlargement  Anterior infarct, old Baseline wander in lead(s) V6 No significant change  was found Confirmed by [facility]  MD, TREY ([id number]) on 10/12/2014 1:07:13 PM      MDM   Final diagnoses:  Stroke    68 year old female presenting with right facial droop and right hand weakness. Symptoms improving at time of arrival. Code stroke called. NIH stroke scale was 2. No tPA given due to resolving and mild symptoms. Plan admission for further stroke workup.    Arbie Cookey, MD 10/12/14 [place], MD 10/12/14 [phone]

## 2014-10-12 NOTE — Progress Notes (Signed)
Unit CM UR Completed by [facility] ED CM  W. Jonanthony Nahar RN  

## 2014-10-12 NOTE — ED Notes (Signed)
Patient returned from MRI.

## 2014-10-12 NOTE — Progress Notes (Signed)
Patient is admitted to room 4N32 at this time. Alert and in stable condition.

## 2014-10-12 NOTE — ED Notes (Signed)
Code stroke called and pt en route to scanner

## 2014-10-12 NOTE — ED Notes (Signed)
Admitting Physician at the bedside.  

## 2014-10-12 NOTE — H&P (Signed)
[facility] History and Physical  Aimee Jones IRC:[id number] DOB: January 04, 1946 DOA: 10/12/2014  Referring physician: EDP PCP: Aimee Stain, MD   Chief Complaint: Right facial droop  HPI: Aimee Jones is a 68 y.o. female with history of hypertension came into the hospital because of fall. Patient said she's been having symptoms of cough, sputum production and shortness of breath for the past 10 days, but no fever. Earlier today she felt dizzy and was about to fall, then she told her husband that she has numbness in her right hand so she was brought to the ED for further evaluation. In the ED she was found to have subtle right-sided facial droop, CT scan without contrast is negative for changes. Patient seen by neurology and recommended admission to rule out acute stroke  Review of Systems:  Constitutional: Dizziness and presyncope Eyes: negative for irritation, redness and visual disturbance Ears, nose, mouth, throat, and face: negative for earaches, epistaxis, nasal congestion and sore throat Respiratory: negative for cough, dyspnea on exertion, sputum and wheezing Cardiovascular: negative for chest pain, dyspnea, lower extremity edema, orthopnea, palpitations and syncope Gastrointestinal: negative for abdominal pain, constipation, diarrhea, melena, nausea and vomiting Genitourinary:negative for dysuria, frequency and hematuria Hematologic/lymphatic: negative for bleeding, easy bruising and lymphadenopathy Musculoskeletal:negative for arthralgias, muscle weakness and stiff joints Neurological: negative for coordination problems, gait problems, headaches and weakness Endocrine: negative for diabetic symptoms including polydipsia, polyuria and weight loss Allergic/Immunologic: negative for anaphylaxis, hay fever and urticaria  Past Medical History  Diagnosis Date  . Hyperlipidemia   . ETOH abuse   . Allergy   . Diverticulosis   . Hypertension   . Depression   .  Asthma   . GERD (gastroesophageal reflux disease)   . Normal 24 hour ambulatory pH monitoring study     good acid suppression  . Dementia   . Shortness of breath   . Anxiety     panic attacks- on occas.   . Arthritis     osteoporosis - especially hips.    Past Surgical History  Procedure Laterality Date  . Dobutamine stress echo  07/09/2001    normal  . Esophagogastroduodenoscopy  06/11/2006    normal  . Abdominal hysterectomy    . Total hip arthroplasty      left 1991/right 2008  . Nasal sinus surgery  1990  . Eye surgery      cataracts removed, IOL- both eyes   . Breast surgery      3x- biposies   . Joint replacement    . Shoulder arthroscopy Left   . Reverse shoulder arthroplasty Right 08/13/2014    Procedure: REVERSE SHOULDER ARTHROPLASTY;  Surgeon: Marin Shutter, MD;  Location: [place];  Service: Orthopedics;  Laterality: Right;  interscalene block   Social History:   reports that she has never smoked. She has never used smokeless tobacco. She reports that she drinks alcohol. She reports that she does not use illicit drugs.  Allergies  Allergen Reactions  . Latex Rash  . Penicillins Rash    No family history on file.   Prior to Admission medications   Medication Sig Start Date End Date Taking? Authorizing Provider  ALPRAZolam Aimee Jones) 0.5 MG tablet Take 0.5 mg by mouth 3 (three) times daily as needed. 09/06/14  Yes Historical Provider, MD  aspirin 81 MG tablet Take 81 mg by mouth daily.   Yes Historical Provider, MD  atorvastatin (LIPITOR) 10 MG tablet Take 10 mg by mouth daily at 6  PM.    Yes Historical Provider, MD  B Complex-C (SUPER B COMPLEX PO) Take 150 mg by mouth daily.    Yes Historical Provider, MD  budesonide-formoterol (SYMBICORT) 160-4.5 MCG/ACT inhaler Inhale 2 puffs into the lungs 2 (two) times daily as needed (shortness of breath). 08/12/14  Yes Aimee Ghent, MD  citalopram (CELEXA) 40 MG tablet Take 40 mg by mouth at bedtime.    Yes Historical  Provider, MD  donepezil (ARICEPT) 10 MG tablet Take 10 mg by mouth daily before breakfast.    Yes Historical Provider, MD  doxepin (SINEQUAN) 25 MG capsule TAKE 1 CAPSULE BY MOUTH DAILY AT BEDTIME 09/16/14  Yes Aimee Ghent, MD  fexofenadine (ALLEGRA) 180 MG tablet Take 180 mg by mouth daily before breakfast.    Yes Historical Provider, MD  fish oil-omega-3 fatty acids 1000 MG capsule Take 1 g by mouth daily.    Yes Historical Provider, MD  fluticasone (FLONASE) 50 MCG/ACT nasal spray Place 2 sprays into both nostrils daily.   Yes Historical Provider, MD  montelukast (SINGULAIR) 10 MG tablet Take 10 mg by mouth at bedtime.  02/21/13  Yes Historical Provider, MD  Multiple Vitamins-Minerals (MULTIVITAMIN PO) Take 1 tablet by mouth daily.   Yes Historical Provider, MD  nabumetone (RELAFEN) 750 MG tablet Take 1 tablet (750 mg total) by mouth daily. With food. 09/16/14  Yes Aimee Ghent, MD  naproxen sodium (ALEVE) 220 MG tablet Take 220 mg by mouth 3 (three) times daily with meals.   Yes Historical Provider, MD  olmesartan-hydrochlorothiazide (BENICAR HCT) 40-25 MG per tablet Take 0.5 tablets by mouth at bedtime. Patient taking differently: Take 1 tablet by mouth at bedtime.  09/16/14  Yes Aimee Ghent, MD  oxyCODONE-acetaminophen (PERCOCET) 5-325 MG per tablet Take 1-2 tablets by mouth every 4 (four) hours as needed. 09/27/14  Yes Aimee Ghent, MD  ranitidine (ZANTAC) 300 MG tablet Take 300 mg by mouth 2 (two) times daily.   Yes Historical Provider, MD  thiamine (VITAMIN B-1) 100 MG tablet Take 100 mg by mouth daily.   Yes Historical Provider, MD  VITAMIN E PO Take 1 capsule by mouth daily.    Yes Historical Provider, MD  diazepam (VALIUM) 5 MG tablet Take 0.5-1 tablets (2.5-5 mg total) by mouth every 6 (six) hours as needed for muscle spasms or sedation. Patient not taking: Reported on 10/12/2014 08/14/14   Aimee Loges, PA-C   Physical Exam: Aimee Jones Vitals:   10/12/14 1330  BP: 114/70    Pulse: 75  Temp:   Resp: 21   Constitutional: Oriented to person, place, and time. Well-developed and well-nourished. Cooperative.  Head: Normocephalic and atraumatic.  Nose: Nose normal.  Mouth/Throat: Uvula is midline, oropharynx is clear and moist and mucous membranes are normal.  Eyes: Conjunctivae and EOM are normal. Pupils are equal, round, and reactive to light.  Neck: Trachea normal and normal range of motion. Neck supple.  Cardiovascular: Normal rate, regular rhythm, S1 normal, S2 normal, normal heart sounds and intact distal pulses.   Pulmonary/Chest: Effort normal and breath sounds normal.  Abdominal: Soft. Bowel sounds are normal. There is no hepatosplenomegaly. There is no tenderness.  Musculoskeletal: Normal range of motion.  Neurological: Alert and oriented to person, place, and time. Has normal strength. No cranial nerve deficit or sensory deficit.  Skin: Skin is warm, dry and intact.  Psychiatric: Has a normal mood and affect. Speech is normal and behavior is normal.   Labs on Admission:  Basic Metabolic Panel:  Recent Labs Lab 10/12/14 1157 10/12/14 1209  NA 137 134*  K 3.9 3.9  CL 104 104  CO2  --  22  GLUCOSE 111* 110*  BUN 36* 33*  CREATININE 1.40* 1.37*  CALCIUM  --  9.8   Liver Function Tests:  Recent Labs Lab 10/12/14 1209  AST 25  ALT 14  ALKPHOS 58  BILITOT 0.7  PROT 6.8  ALBUMIN 3.5   No results for input(s): LIPASE, AMYLASE in the last 168 hours. No results for input(s): AMMONIA in the last 168 hours. CBC:  Recent Labs Lab 10/12/14 1157 10/12/14 1209  WBC  --  23.1*  NEUTROABS  --  16.7*  HGB 15.6* 12.3  HCT 46.0 39.3  MCV  --  80.7  PLT  --  543*   Cardiac Enzymes: No results for input(s): CKTOTAL, CKMB, CKMBINDEX, TROPONINI in the last 168 hours.  BNP (last 3 results) No results for input(s): PROBNP in the last 8760 hours. CBG: No results for input(s): GLUCAP in the last 168 hours.  Radiological Exams on  Admission: Ct Head Wo Contrast  10/12/2014   CLINICAL DATA:  Code stroke.  Dizziness.  Right facial droop.  EXAM: CT HEAD WITHOUT CONTRAST  TECHNIQUE: Contiguous axial images were obtained from the base of the skull through the vertex without intravenous contrast.  COMPARISON:  Brain MRI 05/29/2014  FINDINGS: Atrophy and chronic small vessel ischemic change, stable from prior. No intracranial hemorrhage, mass effect, or midline shift. No hydrocephalus. The basilar cisterns are patent. No evidence of territorial infarct. No intracranial fluid collection. Calvarium is intact. Included paranasal sinuses and mastoid air cells are well aerated.  IMPRESSION: Stable atrophy and chronic small vessel ischemic change. No evidence of acute intracranial abnormality.  These results were called by telephone at the time of interpretation on 10/12/2014 at 11:53 am to Dr. Dr Armida Sans , who verbally acknowledged these results.   Electronically Signed   By: Jeb Levering M.D.   On: 10/12/2014 11:55    EKG: Independently reviewed.   Assessment/Plan Active Problems:   Essential hypertension   GERD   Cough   Facial droop   CKD (chronic kidney disease), stage III    Right facial droop and dysarthria Acute stroke workup, check MRI/MRA of brain. Echocardiogram, carotid duplex and telemetry monitoring as well as 12-lead EKG.. Obtain PT/OT/SLP, frequent neuro checks. Low-dose aspirin per neurology recommendation.  Cough Likely acute bronchitis, start patient on Rocephin and azithromycin. Check chest x-ray to rule out pneumonia. Supportive management with bronchodilators, mucolytics, antitussives and oxygen as needed.  CKD stage III Stable at baseline.  GERD Continue PPI  Mild dementia Per husband, no behavioral changes  Code Status: Full code Family Communication: Plan discussed with the patient in the presence of her husband at bedside Disposition Plan: Inpatient, telemetry  Time spent: 70  minutes  [facility] Pager [phone]

## 2014-10-12 NOTE — ED Notes (Signed)
Patient to MRI at this time.

## 2014-10-12 NOTE — Consult Note (Signed)
Referring Physician: ED    Chief Complaint: code stroke, right face droop, hands numbness, dysarthria.  HPI:                                                                                                                                         Aimee Jones is an 68 y.o. female with a past medical history significant for hyperlipidemia, HTN, mild dementia, asthma, s/p right shoulder surgery 10/15, brought in by husband due to acute onset of the above stated symptoms. As per husband, she woke up fine and around 10:15 she became wobbly while walking in the house. Then, he noticed that the right lip was droopier than the left, some slurred speech, and she complained of hands numbness. Denies associated HA, vertigo, double vision, difficulty swallowing, focal arms/legs weakness, language or vision impairment. NIHSS 2 (1 for face weakness, 1 for orientation) CT brain showed no acute abnormality. Date last known well: 10/12/14 Time last known well: 10:15 am tPA Given: no, mild symptoms NIHSS: 2  Past Medical History  Diagnosis Date  . Hyperlipidemia   . ETOH abuse   . Allergy   . Diverticulosis   . Hypertension   . Depression   . Asthma   . GERD (gastroesophageal reflux disease)   . Normal 24 hour ambulatory pH monitoring study     good acid suppression  . Dementia   . Shortness of breath   . Anxiety     panic attacks- on occas.   . Arthritis     osteoporosis - especially hips.     Past Surgical History  Procedure Laterality Date  . Dobutamine stress echo  07/09/2001    normal  . Esophagogastroduodenoscopy  06/11/2006    normal  . Abdominal hysterectomy    . Total hip arthroplasty      left 1991/right 2008  . Nasal sinus surgery  1990  . Eye surgery      cataracts removed, IOL- both eyes   . Breast surgery      3x- biposies   . Joint replacement    . Shoulder arthroscopy Left   . Reverse shoulder arthroplasty Right 08/13/2014    Procedure: REVERSE SHOULDER  ARTHROPLASTY;  Surgeon: Marin Shutter, MD;  Location: [place];  Service: Orthopedics;  Laterality: Right;  interscalene block    No family history on file. Social History:  reports that she has never smoked. She has never used smokeless tobacco. She reports that she drinks alcohol. She reports that she does not use illicit drugs.  Allergies:  Allergies  Allergen Reactions  . Latex Rash  . Penicillins Rash    Medications:  I have reviewed the patient's current medications.  ROS:                                                                                                                                       History obtained from husband and the patient  General ROS: negative for - chills, fatigue, fever, night sweats, weight gain or weight loss Psychological ROS: negative for - behavioral disorder, hallucinations, or suicidal ideation Ophthalmic ROS: negative for - blurry vision, double vision, eye pain or loss of vision ENT ROS: negative for - epistaxis, nasal discharge, oral lesions, sore throat, tinnitus or vertigo Allergy and Immunology ROS: negative for - hives or itchy/watery eyes Hematological and Lymphatic ROS: negative for - bleeding problems, bruising or swollen lymph nodes Endocrine ROS: negative for - galactorrhea, hair pattern changes, polydipsia/polyuria or temperature intolerance Respiratory ROS: negative for - cough, hemoptysis, shortness of breath or wheezing Cardiovascular ROS: negative for - chest pain, dyspnea on exertion, edema or irregular heartbeat Gastrointestinal ROS: negative for - abdominal pain, diarrhea, hematemesis, nausea/vomiting or stool incontinence Genito-Urinary ROS: negative for - dysuria, hematuria, incontinence or urinary frequency/urgency Musculoskeletal ROS: negative for - joint swelling or muscular weakness Neurological  ROS: as noted in HPI Dermatological ROS: negative for rash and skin lesion changes  Physical exam: pleasant female in no apparent distress. Blood pressure 101/65, pulse 89, temperature 98.2 F (36.8 C), temperature source Oral, resp. rate 20, SpO2 92 %. Head: normocephalic. Neck: supple, no bruits, no JVD. Cardiac: no murmurs. Lungs: clear. Abdomen: soft, no tender, no mass. Extremities: no edema. Neurologic Examination:                                                                                                      General: Mental Status: Alert, oriented, thought content appropriate.  Speech fluent without evidence of aphasia.  Able to follow 3 step commands without difficulty. Cranial Nerves: II: Discs flat bilaterally; Visual fields grossly normal, pupils equal, round, reactive to light and accommodation III,IV, VI: ptosis not present, extra-ocular motions intact bilaterally V,VII: smile asymmetric due to subtle right face weakness, facial light touch sensation normal bilaterally VIII: hearing normal bilaterally IX,X: gag reflex present XI: bilateral shoulder shrug XII: midline tongue extension without atrophy or fasciculations Motor: Right : Upper extremity   5/5    Left:     Upper extremity   5/5  Lower extremity   5/5     Lower extremity   5/5 Tone and bulk:normal tone  throughout; no atrophy noted Sensory: Pinprick and light touch intact throughout, bilaterally Deep Tendon Reflexes:  Right: Upper Extremity   Left: Upper extremity   biceps (C-5 to C-6) 2/4   biceps (C-5 to C-6) 2/4 tricep (C7) 2/4    triceps (C7) 2/4 Brachioradialis (C6) 2/4  Brachioradialis (C6) 2/4  Lower Extremity Lower Extremity  quadriceps (L-2 to L-4) 2/4   quadriceps (L-2 to L-4) 2/4 Achilles (S1) 2/4   Achilles (S1) 2/4  Plantars: Right: downgoing   Left: downgoing Cerebellar: normal finger-to-nose,  normal heel-to-shin test Gait:  No tested due to safety reasons. CV: pulses palpable  throughout    No results found for this or any previous visit (from the past 48 hour(s)). Ct Head Wo Contrast  10/12/2014   CLINICAL DATA:  Code stroke.  Dizziness.  Right facial droop.  EXAM: CT HEAD WITHOUT CONTRAST  TECHNIQUE: Contiguous axial images were obtained from the base of the skull through the vertex without intravenous contrast.  COMPARISON:  Brain MRI 05/29/2014  FINDINGS: Atrophy and chronic small vessel ischemic change, stable from prior. No intracranial hemorrhage, mass effect, or midline shift. No hydrocephalus. The basilar cisterns are patent. No evidence of territorial infarct. No intracranial fluid collection. Calvarium is intact. Included paranasal sinuses and mastoid air cells are well aerated.  IMPRESSION: Stable atrophy and chronic small vessel ischemic change. No evidence of acute intracranial abnormality.  These results were called by telephone at the time of interpretation on 10/12/2014 at 11:53 am to Dr. Dr Armida Sans , who verbally acknowledged these results.   Electronically Signed   By: Jeb Levering M.D.   On: 10/12/2014 11:55    Assessment: 68 y.o. female comes in with new onset right face droop, hands numbness, dysarthria, and unsteadiness.  NIHSS 2 (could be 1, as she has mild dementia), CT brain unremarkable for acute abnormality. Deemed to have mild deficits to treat with thrombolytic therapy. Admit to medicine and complete stroke work up.  Stroke Risk Factors - age, HTN, hyperlipidemia.  Plan: 1. HgbA1c, fasting lipid panel 2. MRI, MRA  of the brain without contrast 3. Echocardiogram 4. Carotid dopplers 5. Prophylactic therapy-aspirin 81 mg daily 6. Risk factor modification 7. Telemetry monitoring 8. Frequent neuro checks 9. PT/OT SLP   Dorian Pod, MD [facility] [phone]  10/12/2014, 11:59 AM

## 2014-10-13 ENCOUNTER — Ambulatory Visit: Payer: Medicare Other | Admitting: Family Medicine

## 2014-10-13 DIAGNOSIS — E785 Hyperlipidemia, unspecified: Secondary | ICD-10-CM

## 2014-10-13 DIAGNOSIS — I369 Nonrheumatic tricuspid valve disorder, unspecified: Secondary | ICD-10-CM

## 2014-10-13 DIAGNOSIS — I6330 Cerebral infarction due to thrombosis of unspecified cerebral artery: Secondary | ICD-10-CM | POA: Diagnosis present

## 2014-10-13 DIAGNOSIS — I639 Cerebral infarction, unspecified: Secondary | ICD-10-CM

## 2014-10-13 LAB — LIPID PANEL
Cholesterol: 142 mg/dL (ref 0–200)
HDL: 34 mg/dL — ABNORMAL LOW (ref >39)
LDL CALC: 72 mg/dL (ref 0–99)
Total CHOL/HDL Ratio: 4.2 RATIO
Triglycerides: 180 mg/dL — ABNORMAL HIGH (ref <150)
VLDL: 36 mg/dL (ref 0–40)

## 2014-10-13 LAB — HEMOGLOBIN A1C
HEMOGLOBIN A1C: 5.7 % — AB (ref <5.7)
Mean Plasma Glucose: 117 mg/dL — ABNORMAL HIGH (ref <117)

## 2014-10-13 MED ORDER — DOXEPIN HCL 25 MG PO CAPS
25.0000 mg | ORAL_CAPSULE | Freq: Once | ORAL | Status: AC
  Administered 2014-10-13: 25 mg via ORAL
  Filled 2014-10-13: qty 1

## 2014-10-13 MED ORDER — ASPIRIN EC 325 MG PO TBEC: 325.0000 mg | DELAYED_RELEASE_TABLET | Freq: Every day | ORAL | Status: DC

## 2014-10-13 MED ORDER — DOXEPIN HCL 10 MG PO CAPS
10.0000 mg | ORAL_CAPSULE | Freq: Once | ORAL | Status: AC
  Administered 2014-10-13: 10 mg via ORAL
  Filled 2014-10-13: qty 1

## 2014-10-13 MED ORDER — SODIUM CHLORIDE 0.9 % IV BOLUS (SEPSIS)
500.0000 mL | Freq: Once | INTRAVENOUS | Status: AC
  Administered 2014-10-13: 500 mL via INTRAVENOUS

## 2014-10-13 NOTE — Progress Notes (Signed)
[facility] PROGRESS NOTE   Aimee Jones HKV:[id number] DOB: 1946-09-19 DOA: 10/12/2014 PCP: [name], MD  HPI/Subjective: Seen with Aimee Jones at bedside concerned that she needs to be discharge ASAP. Her late brother's funeral is tomorrow at 2 PM and she is wondering if she can attend it.  Assessment/Plan: Active Problems:   Essential hypertension   GERD   Cough   Facial droop   CKD (chronic kidney disease), stage III   Cerebral thrombosis with cerebral infarction    Right facial droop and dysarthria Acute stroke workup, check MRI/MRA of brain. MRI is positive for bilateral occipital strokes, stroke team to follow. Obtain PT/OT/SLP, frequent neuro checks. 2-D echo and carotid duplex pending. Bilateral small strokes, await neurology recommendation if patient requires TEE. Low-dose aspirin per neurology recommendation.  Acute bronchitis Productive cough and shortness of breath, start patient on Rocephin and azithromycin. Check chest x-ray to rule out pneumonia. Supportive management with bronchodilators, mucolytics, antitussives and oxygen as needed.  CKD stage III Stable at baseline. Check BMP in a.m. DC IV fluids.  GERD Continue PPI  Mild dementia Per Aimee Jones, no behavioral changes  Code Status: Full code Family Communication: Plan discussed with the patient. Disposition Plan: Remains inpatient   Consultants:  Neurology  Procedures:  Pending 2-D echo and carotid duplex  Antibiotics:  Rocephin and azithromycin   Objective: Filed Vitals:   10/13/14 0646  BP:   Pulse: 70  Temp: 97.5 F (36.4 C)  Resp: 18    Intake/Output Summary (Last 24 hours) at 10/13/14 1157 Last data filed at 10/13/14 0943  Gross per 24 hour  Intake      0 ml  Output      1 ml  Net     -1 ml   Filed Weights   10/12/14 1845  Weight: 78.654 kg (173 lb 6.4 oz)    Exam: General: Alert and awake, oriented x3, not in any acute distress. HEENT: anicteric  sclera, pupils reactive to light and accommodation, EOMI CVS: S1-S2 clear, no murmur rubs or gallops Chest: clear to auscultation bilaterally, no wheezing, rales or rhonchi Abdomen: soft nontender, nondistended, normal bowel sounds, no organomegaly Extremities: no cyanosis, clubbing or edema noted bilaterally Neuro: Cranial nerves II-XII intact, no focal neurological deficits  Data Reviewed: Basic Metabolic Panel:  Recent Labs Lab 10/12/14 1157 10/12/14 1209  NA 137 134*  K 3.9 3.9  CL 104 104  CO2  --  22  GLUCOSE 111* 110*  BUN 36* 33*  CREATININE 1.40* 1.37*  CALCIUM  --  9.8   Liver Function Tests:  Recent Labs Lab 10/12/14 1209  AST 25  ALT 14  ALKPHOS 58  BILITOT 0.7  PROT 6.8  ALBUMIN 3.5   No results for input(s): LIPASE, AMYLASE in the last 168 hours. No results for input(s): AMMONIA in the last 168 hours. CBC:  Recent Labs Lab 10/12/14 1157 10/12/14 1209  WBC  --  23.1*  NEUTROABS  --  16.7*  HGB 15.6* 12.3  HCT 46.0 39.3  MCV  --  80.7  PLT  --  543*   Cardiac Enzymes: No results for input(s): CKTOTAL, CKMB, CKMBINDEX, TROPONINI in the last 168 hours. BNP (last 3 results) No results for input(s): PROBNP in the last 8760 hours. CBG: No results for input(s): GLUCAP in the last 168 hours.  Micro No results found for this or any previous visit (from the past 240 hour(s)).   Studies: Dg Chest 2 View  10/12/2014  CLINICAL DATA:  Cough and difficulty breathing  EXAM: CHEST  2 VIEW  COMPARISON:  October 19, 2009  FINDINGS: There is atelectasis in the left base. Elsewhere lungs are clear. Heart size and pulmonary vascularity are normal. No adenopathy. 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  IMPRESSION: Left base atelectasis.  Elsewhere lungs clear.   Electronically Signed   By: Lowella Grip M.D.   On: 10/12/2014 21:31   Ct Head Wo Contrast  10/12/2014   CLINICAL DATA:  Code stroke.  Dizziness.  Right facial droop.  EXAM: CT HEAD  WITHOUT CONTRAST  TECHNIQUE: Contiguous axial images were obtained from the base of the skull through the vertex without intravenous contrast.  COMPARISON:  Brain MRI 05/29/2014  FINDINGS: Atrophy and chronic small vessel ischemic change, stable from prior. No intracranial hemorrhage, mass effect, or midline shift. No hydrocephalus. The basilar cisterns are patent. No evidence of territorial infarct. No intracranial fluid collection. Calvarium is intact. Included paranasal sinuses and mastoid air cells are well aerated.  IMPRESSION: Stable atrophy and chronic small vessel ischemic change. No evidence of acute intracranial abnormality.  These results were called by telephone at the time of interpretation on 10/12/2014 at 11:53 am to Dr. Dr Armida Sans , who verbally acknowledged these results.   Electronically Signed   By: Jeb Levering M.D.   On: 10/12/2014 11:55   Mr Jodene Nam Head Wo Contrast  10/12/2014   CLINICAL DATA:  Stroke.  Hypertension and hyperlipidemia  EXAM: MRI HEAD WITHOUT CONTRAST  MRA HEAD WITHOUT CONTRAST  TECHNIQUE: Multiplanar, multiecho pulse sequences of the brain and surrounding structures were obtained without intravenous contrast. Angiographic images of the head were obtained using MRA technique without contrast.  COMPARISON:  CT head 10/12/2014  FINDINGS: MRI HEAD FINDINGS  Small areas of acute infarct in the left occipital cortex. Probable small area of acute infarct in the right occipital cortex. No other areas of acute infarct.  Generalized atrophy. Moderate chronic microvascular ischemic changes similar to the prior MRI.  Negative for hemorrhage  Negative for mass or edema.  No shift of the midline structures  Minimal mucosal edema in the paranasal sinuses.  MRA HEAD FINDINGS  Both vertebral arteries widely patent. PICA patent bilaterally. Superior cerebellar, and posterior cerebral arteries widely patent. Basilar widely patent.  Internal carotid artery widely patent bilaterally. Anterior  and middle cerebral arteries widely patent bilaterally.  Negative for cerebral aneurysm.  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  Atrophy and chronic microvascular ischemic changes are stable from the prior MRI  Negative MRA head.   Electronically Signed   By: Franchot Gallo M.D.   On: 10/12/2014 14:49   Mr Brain Wo Contrast  10/12/2014   CLINICAL DATA:  Stroke.  Hypertension and hyperlipidemia  EXAM: MRI HEAD WITHOUT CONTRAST  MRA HEAD WITHOUT CONTRAST  TECHNIQUE: Multiplanar, multiecho pulse sequences of the brain and surrounding structures were obtained without intravenous contrast. Angiographic images of the head were obtained using MRA technique without contrast.  COMPARISON:  CT head 10/12/2014  FINDINGS: MRI HEAD FINDINGS  Small areas of acute infarct in the left occipital cortex. Probable small area of acute infarct in the right occipital cortex. No other areas of acute infarct.  Generalized atrophy. Moderate chronic microvascular ischemic changes similar to the prior MRI.  Negative for hemorrhage  Negative for mass or edema.  No shift of the midline structures  Minimal mucosal edema in the paranasal sinuses.  MRA HEAD FINDINGS  Both vertebral arteries widely patent. PICA patent bilaterally. Superior cerebellar, and posterior cerebral arteries widely patent. Basilar widely patent.  Internal carotid artery widely patent bilaterally. Anterior and middle cerebral arteries widely patent bilaterally.  Negative for cerebral aneurysm.  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  Atrophy and chronic microvascular ischemic changes are stable from the prior MRI  Negative MRA head.   Electronically Signed   By: Franchot Gallo M.D.   On: 10/12/2014 14:49    Scheduled Meds: . aspirin  81 mg Oral Daily  . atorvastatin  10 mg Oral q1800  . azithromycin  500 mg Intravenous Q24H  . cefTRIAXone  (ROCEPHIN)  IV  1 g Intravenous Q24H  . citalopram  40 mg Oral QHS  . fluticasone  2 spray Each Nare Daily  . guaiFENesin  1,200 mg Oral BID  . heparin  5,000 Units Subcutaneous 3 times per day  . irbesartan  300 mg Oral QHS   And  . hydrochlorothiazide  25 mg Oral QHS  . ipratropium-albuterol  3 mL Nebulization BID  . loratadine  10 mg Oral Daily   Continuous Infusions: . sodium chloride         Time spent: 35 minutes    [facility] A  [facility] Pager [phone] If 7PM-7AM, please contact night-coverage at [email or web address], password [facility] 10/13/2014, 11:57 AM  LOS: 1 day

## 2014-10-13 NOTE — Progress Notes (Addendum)
Patient is highly anxious and wants doxepin for anxiety and bedrest. Notified patient that doxepin that was given was a one time dose and that I send request to NP/PA on call. Will continue to monitor accordingly.   Sent message to Baltazar Najjar, will notify that message was sent.

## 2014-10-13 NOTE — Evaluation (Signed)
Physical Therapy Evaluation Patient Details Name: Aimee Jones MRN: [id number] DOB: [birth year] Today's Date: 10/13/2014   History of Present Illness  Patient is a 68 y/o female who presents from home s/p fall. Pt said she's been having symptoms of cough, sputum production and shortness of breath for the past 10 days, but no fever. Earlier today (12/28) she felt dizzy and was about to fall, then she told her husband that she has numbness in her right hand so she was brought to the ED for further evaluation. MRI-Small areas of acute cortical infarct in the occipital lobe, bilaterally, probable emboli.Marland Kitchen NIHSS:2.  Clinical Impression   Patient tolerated ambulating community distances while performing higher level balance challenges and negotiating steps with supervision without difficulty. Pt reports no new symptoms or deficits. Pt is functioning close to baseline and does not require any skilled therapy services. Pt has 24/7 supervision at home. Discharge from therapy.    Follow Up Recommendations No PT follow up;Supervision - Intermittent    Equipment Recommendations  None recommended by PT    Recommendations for Other Services       Precautions / Restrictions Precautions Precautions: None Restrictions Weight Bearing Restrictions: No      Mobility  Bed Mobility               General bed mobility comments: Received sitting in chair upon PT arrival.   Transfers Overall transfer level: Needs assistance Equipment used: None Transfers: Sit to/from Stand Sit to Stand: Supervision         General transfer comment: Supervision for safety.   Ambulation/Gait Ambulation/Gait assistance: Supervision Ambulation Distance (Feet): 250 Feet Assistive device: None Gait Pattern/deviations: Step-through pattern;Decreased stride length   Gait velocity interpretation: at or above normal speed for age/gender General Gait Details: Pt with steady gait, able to perform higher level  balance challenges with only mild deviations in gait but no LOB.  Stairs Stairs: Yes Stairs assistance: Supervision Stair Management: Alternating pattern;One rail Left Number of Stairs: 5 General stair comments: Supervision for safety.   Wheelchair Mobility    Modified Rankin (Stroke Patients Only) Modified Rankin (Stroke Patients Only) Pre-Morbid Rankin Score: Slight disability Modified Rankin: Slight disability     Balance Overall balance assessment: Needs assistance Sitting-balance support: Feet supported;No upper extremity supported Sitting balance-Leahy Scale: Good     Standing balance support: During functional activity Standing balance-Leahy Scale: Good               High level balance activites: Direction changes;Turns;Sudden stops;Head turns High Level Balance Comments: Tolerated above balance activities with only mild deviations in gait path but no LOB.              Pertinent Vitals/Pain Pain Assessment: No/denies pain    Home Living Family/patient expects to be discharged to:: Private residence Living Arrangements: Alone Available Help at Discharge: Family;Available 24 hours/day Type of Home: House Home Access: Stairs to enter Entrance Stairs-Rails: Psychiatric nurse of Steps: 5 Home Layout: One level Home Equipment: Walker - 2 wheels;Cane - single point;Crutches;Walker - 4 wheels      Prior Function Level of Independence: Independent         Comments: Husband drives.      Hand Dominance   Dominant Hand: Right Pt with recent shoulder surgery in October. Limited AROM due to this but functional and pt get PT for it.   Extremity/Trunk Assessment   Upper Extremity Assessment: Overall WFL for tasks assessed  Lower Extremity Assessment: Overall WFL for tasks assessed         Communication   Communication: No difficulties  Cognition Arousal/Alertness: Awake/alert Behavior During Therapy: WFL for tasks  assessed/performed Overall Cognitive Status: History of cognitive impairments - at baseline                      General Comments      Exercises        Assessment/Plan    PT Assessment Patent does not need any further PT services  PT Diagnosis     PT Problem List    PT Treatment Interventions     PT Goals (Current goals can be found in the Care Plan section) Acute Rehab PT Goals PT Goal Formulation: All assessment and education complete, DC therapy    Frequency     Barriers to discharge        Co-evaluation               End of Session Equipment Utilized During Treatment: Gait belt Activity Tolerance: Patient tolerated treatment well Patient left: in bed;with call bell/phone within reach;with family/visitor present (with tech from [employer]) Nurse Communication: Mobility status         Time: 4166-0630 PT Time Calculation (min) (ACUTE ONLY): 15 min   Charges:   PT Evaluation $Initial PT Evaluation Tier I: 1 Procedure PT Treatments $Gait Training: 8-22 mins   PT G CodesCandy Sledge A 2014-11-10, 11:38 AM Candy Sledge, PT, DPT [phone]

## 2014-10-13 NOTE — Progress Notes (Signed)
  Echocardiogram 2D Echocardiogram has been performed.  Aimee Jones 10/13/2014, 12:02 PM

## 2014-10-13 NOTE — Progress Notes (Signed)
VASCULAR LAB PRELIMINARY  PRELIMINARY  PRELIMINARY  PRELIMINARY  Bilateral lower extremity venous duplex and carotid duplex completed.    Preliminary report:   1.  Bilateral:  No evidence of DVT, superficial thrombosis, or Baker's Cyst. 2.  Bilateral:  1-39% ICA stenosis.  Vertebral artery flow is antegrade.      Gurinder Toral, RVT 10/13/2014, 4:02 PM

## 2014-10-13 NOTE — Progress Notes (Signed)
STROKE TEAM PROGRESS NOTE   HISTORY CAMILLA SKEEN is an 68 y.o. female with a past medical history significant for hyperlipidemia, HTN, mild dementia, asthma, s/p right shoulder surgery 10/15, brought in by husband due to acute onset of right face droop, hands numbness, dysarthria. As per husband, she woke up fine and around 10:15 10/12/2014 (LKW), became wobbly while walking in the house. Then, he noticed that the right lip was droopier than the left, some slurred speech, and she complained of hands numbness. Denies associated HA, vertigo, double vision, difficulty swallowing, focal arms/legs weakness, language or vision impairment. NIHSS 2 (1 for face weakness, 1 for orientation). CT brain showed no acute abnormality. Patient was not administered TPA secondary to mild symptoms. She was admitted for further evaluation and treatment.   SUBJECTIVE (INTERVAL HISTORY) Her husband is at the bedside.  Overall she feels her condition is completely resolved. [place]. She has been under some stress. Her brother is being buried Architectural technologist. Patient's mother died from bacterial clot that went to her brain and led to stroke.   OBJECTIVE Temp:  [97.5 F (36.4 C)-98.1 F (36.7 C)] 97.5 F (36.4 C) (12/29 0646) Pulse Rate:  [67-86] 70 (12/29 0646) Cardiac Rhythm:  [-]  Resp:  [18-22] 18 (12/29 0646) BP: (85-124)/(48-88) 99/60 mmHg (12/29 0450) SpO2:  [93 %-100 %] 93 % (12/29 0646) Weight:  [78.654 kg (173 lb 6.4 oz)] 78.654 kg (173 lb 6.4 oz) (12/28 1845)  No results for input(s): GLUCAP in the last 168 hours.  Recent Labs Lab 10/12/14 1157 10/12/14 1209  NA 137 134*  K 3.9 3.9  CL 104 104  CO2  --  22  GLUCOSE 111* 110*  BUN 36* 33*  CREATININE 1.40* 1.37*  CALCIUM  --  9.8    Recent Labs Lab 10/12/14 1209  AST 25  ALT 14  ALKPHOS 58  BILITOT 0.7  PROT 6.8  ALBUMIN 3.5    Recent Labs Lab 10/12/14 1157 10/12/14 1209  WBC   --  23.1*  NEUTROABS  --  16.7*  HGB 15.6* 12.3  HCT 46.0 39.3  MCV  --  80.7  PLT  --  543*   No results for input(s): CKTOTAL, CKMB, CKMBINDEX, TROPONINI in the last 168 hours.  Recent Labs  10/12/14 1209  LABPROT 14.0  INR 1.07    Recent Labs  10/12/14 1259  COLORURINE YELLOW  LABSPEC 1.020  PHURINE 5.0  GLUCOSEU NEGATIVE  HGBUR NEGATIVE  BILIRUBINUR NEGATIVE  KETONESUR NEGATIVE  PROTEINUR NEGATIVE  UROBILINOGEN 0.2  NITRITE NEGATIVE  LEUKOCYTESUR MODERATE*       Component Value Date/Time   CHOL 142 10/13/2014 0600   TRIG 180* 10/13/2014 0600   HDL 34* 10/13/2014 0600   CHOLHDL 4.2 10/13/2014 0600   VLDL 36 10/13/2014 0600   LDLCALC 72 10/13/2014 0600   Lab Results  Component Value Date   HGBA1C 5.7* 10/13/2014      Component Value Date/Time   LABOPIA POSITIVE* 10/12/2014 1259   COCAINSCRNUR NONE DETECTED 10/12/2014 1259   LABBENZ POSITIVE* 10/12/2014 1259   AMPHETMU NONE DETECTED 10/12/2014 1259   THCU NONE DETECTED 10/12/2014 1259   LABBARB NONE DETECTED 10/12/2014 1259     Recent Labs Lab 10/12/14 1209  ETH <5    Ct Head Wo Contrast  10/12/2014    Stable atrophy and chronic small vessel ischemic change. No evidence of acute intracranial abnormality.    Mr Brain Wo Contrast  10/12/2014    Small areas of acute cortical infarct in the occipital lobe bilaterally, probable emboli.  Atrophy and chronic microvascular ischemic changes are stable from the prior MRI    Mr [facility] Contrast  10/12/2014    Negative MRA head  Dg Chest 2 View  10/12/2014   Left base atelectasis.  Elsewhere lungs clear.     CUS - Bilateral: 1-39% ICA stenosis. Vertebral artery flow is antegrade.  LE venous doppler - negative for DVT  2D ehco - Left ventricle: The cavity size was normal. Wall thickness was increased in a pattern of mild LVH. Systolic function was normal. The estimated ejection fraction was in the range of 55% to 60%. Wall motion was  normal; there were no regional wall motion abnormalities. Doppler parameters are consistent with abnormal left ventricular relaxation (grade 1 diastolic dysfunction). - Aortic valve: Valve area (VTI): 1.51 cm^2. Valve area (Vmax): 1.84 cm^2. Valve area (Vmean): 1.41 cm^2.  Impressions: - No cardiac source of emboli was indentified.   PHYSICAL EXAM  Temp:  [97.5 F (36.4 C)-98.8 F (37.1 C)] 98.5 F (36.9 C) (12/29 2218) Pulse Rate:  [67-86] 84 (12/29 2218) Resp:  [18-20] 18 (12/29 2218) BP: (85-116)/(48-67) 109/67 mmHg (12/29 2218) SpO2:  [93 %-100 %] 94 % (12/29 2218)  General - Well nourished, well developed, in no apparent distress but mild anxious and restless.  Ophthalmologic - not cooperative on exam.  Cardiovascular - Regular rate and rhythm with no murmur.  Mental Status -  Awake and alert and orientation to self and place but not to time.  Language including expression, naming, repetition, comprehensio was assessed and found intact except mild naming difficulty. Fund of Knowledge was assessed and was impaired.  Cranial Nerves II - XII - II - Visual field intact OU. III, IV, VI - Extraocular movements intact. V - Facial sensation intact bilaterally. VII - mild right nasolabial fold flatening VIII - Hearing & vestibular intact bilaterally. X - Palate elevates symmetrically. XI - Chin turning & shoulder shrug intact bilaterally. XII - Tongue protrusion intact.  Motor Strength - The patient's strength was normal in all extremities and pronator drift was absent.  Bulk was normal and fasciculations were absent.   Motor Tone - Muscle tone was assessed at the neck and appendages and was normal  Reflexes - The patient's reflexes were normal in all extremities and she had no pathological reflexes.  Sensory - Light touch, temperature/pinprick were assessed and were normal.    Coordination - The patient had normal movements in the hands and feet with no ataxia or  dysmetria.  Tremor was absent.  Gait and Station - The patient's transfers, posture, gait, station, and turns were observed as normal.  ASSESSMENT/PLAN Ms. JATOYA ARMBRISTER is a 68 y.o. female with history of for hyperlipidemia, HTN, mild dementia, asthma, s/p right shoulder surgery 10/15 presenting with right face droop, hands numbness, dysarthria. She did not receive IV t-PA due to mild symptoms.   Stroke:  bilateral punctate PCA/occipital infarcts, embolic secondary to unknown source  Resultant  Dysarthria resolved, R facial droop remains  MRI  Bilateral occipital embolic infarcts  MRA  Unremarkable   Carotid Doppler  unremarkable  2D Echo  unremarkable  LE venous dopplers negative for DVT  TEE to look for embolic source. Arranged with [place] for tomorrow.Bilateral lower extremity venous dopplers to rule out DVT as possible source of stroke has already been ordered. (I have made patient  NPO after midnight tonight).   If TEE negative, a [place] electrophysiologist will consult and consider placement of an implantable loop recorder to evaluate for atrial fibrillation as etiology of stroke. This has been explained to patient/family by Dr. Erlinda Hong and they are agreeable.   HgbA1c 5.9  Heparin 5000 units sq tid for VTE prophylaxis  Diet Heart thin liquids  aspirin 81 mg orally every day prior to admission, now on aspirin 81 mg orally every day. Recommend to increase to ASA 325mg  (ordered). .   Ongoing aggressive stroke risk factor management  Therapy recommendations:  No PT  Disposition:  Return home with husband  Hypertension  Home meds:   benicar  BP 85-124/50-69 past 24h (10/13/2014 @ 1:30 PM)   Stable  Hyperlipidemia  Home meds:  lipitor 10, resumed in hospital  LDL 72, goal < 70  Continue statin at discharge  Other Stroke Risk Factors  Advanced age  ETOH use, hx heavy ETOH use. ? Correlated to ETOH  dementia dx per Dr. Carles Collet. She drinks 4 oz liquor daily  Other Active Problems  Baseline dementia - ETOH dementia diagnosed 1.5 years ago, probably present for years prior to that per husband  Anxiety/depression  Cough, likely acute bronchitis, on Rocephin and azithromycin  CKD stage III  GERD  Parkinson's secondary to Abilify. Stopped drug nov 2014. Parksinson's symptoms resolved - follows up with Dr. Carles Collet at [facility]  Osteoarthritis  Urinary incontinence, wears depends x 3 y  Other Pertinent History  depression  Hospital day # [place] for Pager information 10/13/2014 1:21 PM   I, the attending vascular neurologist, have personally obtained a history, examined the patient, evaluated laboratory data, individually viewed imaging studies and agree with radiology interpretations. Together with the NP/PA, we formulated the assessment and plan of care which reflects our mutual decision.  I have made any additions or clarifications directly to the above note and agree with the findings and plan as currently documented.   68 yo F with HTN and HLD presented with episodes of dizziness, right facial droop and right hand numbness and tingling. Symptoms may be explained by PCA involvement. MRI showed b/l PCA punctate strokes, concerning for cardioembolic stroke. Will do TEE and potential loop recorder. Increase ASA to 325mg .   Rosalin Hawking, MD PhD Stroke Neurology 10/13/2014 11:33 PM       To contact Stroke Continuity provider, please refer to [email or web address] After hours, contact General Neurology

## 2014-10-14 ENCOUNTER — Encounter (HOSPITAL_COMMUNITY): Payer: Self-pay | Admitting: *Deleted

## 2014-10-14 ENCOUNTER — Encounter (HOSPITAL_COMMUNITY)
Admission: EM | Disposition: A | Discharge status: Home / Self Care | Payer: Self-pay | Source: Home / Self Care | Attending: Internal Medicine

## 2014-10-14 ENCOUNTER — Ambulatory Visit (HOSPITAL_COMMUNITY): Admit: 2014-10-14 | Payer: Self-pay | Admitting: Internal Medicine

## 2014-10-14 DIAGNOSIS — I639 Cerebral infarction, unspecified: Secondary | ICD-10-CM | POA: Insufficient documentation

## 2014-10-14 HISTORY — PX: TEE WITHOUT CARDIOVERSION: SHX5443

## 2014-10-14 HISTORY — PX: LOOP RECORDER IMPLANT: SHX5477

## 2014-10-14 LAB — BASIC METABOLIC PANEL
ANION GAP: 7 (ref 5–15)
BUN: 24 mg/dL — ABNORMAL HIGH (ref 6–23)
CALCIUM: 9.3 mg/dL (ref 8.4–10.5)
CO2: 24 mmol/L (ref 19–32)
Chloride: 106 mEq/L (ref 96–112)
Creatinine, Ser: 1.21 mg/dL — ABNORMAL HIGH (ref 0.50–1.10)
GFR calc Af Amer: 52 mL/min — ABNORMAL LOW (ref >90)
GFR calc non Af Amer: 45 mL/min — ABNORMAL LOW (ref >90)
Glucose, Bld: 86 mg/dL (ref 70–99)
Potassium: 4.1 mmol/L (ref 3.5–5.1)
SODIUM: 137 mmol/L (ref 135–145)

## 2014-10-14 LAB — CBC
HEMATOCRIT: 34.8 % — AB (ref 36.0–46.0)
Hemoglobin: 10.7 g/dL — ABNORMAL LOW (ref 12.0–15.0)
MCH: 24.9 pg — ABNORMAL LOW (ref 26.0–34.0)
MCHC: 30.7 g/dL (ref 30.0–36.0)
MCV: 81.1 fL (ref 78.0–100.0)
PLATELETS: 505 10*3/uL — AB (ref 150–400)
RBC: 4.29 MIL/uL (ref 3.87–5.11)
RDW: 16.8 % — AB (ref 11.5–15.5)
WBC: 11.1 10*3/uL — ABNORMAL HIGH (ref 4.0–10.5)

## 2014-10-14 SURGERY — LOOP RECORDER IMPLANT
Anesthesia: LOCAL

## 2014-10-14 SURGERY — ECHOCARDIOGRAM, TRANSESOPHAGEAL
Anesthesia: Moderate Sedation

## 2014-10-14 MED ORDER — MIDAZOLAM HCL 10 MG/2ML IJ SOLN
INTRAMUSCULAR | Status: DC | PRN
  Administered 2014-10-14 (×2): 2 mg via INTRAVENOUS

## 2014-10-14 MED ORDER — SODIUM CHLORIDE 0.9 % IV SOLN
INTRAVENOUS | Status: DC
  Administered 2014-10-14: 08:00:00 via INTRAVENOUS

## 2014-10-14 MED ORDER — LIDOCAINE VISCOUS 2 % MT SOLN
OROMUCOSAL | Status: AC
  Filled 2014-10-14: qty 15

## 2014-10-14 MED ORDER — FENTANYL CITRATE 0.05 MG/ML IJ SOLN
INTRAMUSCULAR | Status: AC
  Filled 2014-10-14: qty 2

## 2014-10-14 MED ORDER — ACETAMINOPHEN 325 MG PO TABS: 325.0000 mg | ORAL_TABLET | ORAL | Status: DC | PRN

## 2014-10-14 MED ORDER — LIDOCAINE-EPINEPHRINE 1 %-1:100000 IJ SOLN
INTRAMUSCULAR | Status: AC
  Filled 2014-10-14: qty 1

## 2014-10-14 MED ORDER — ONDANSETRON HCL 4 MG/2ML IJ SOLN: 4.0000 mg | Freq: Four times a day (QID) | INTRAMUSCULAR | Status: DC | PRN

## 2014-10-14 MED ORDER — FENTANYL CITRATE 0.05 MG/ML IJ SOLN
INTRAMUSCULAR | Status: DC | PRN
  Administered 2014-10-14 (×2): 25 ug via INTRAVENOUS

## 2014-10-14 MED ORDER — MIDAZOLAM HCL 5 MG/ML IJ SOLN
INTRAMUSCULAR | Status: AC
  Filled 2014-10-14: qty 2

## 2014-10-14 MED ORDER — GUAIFENESIN ER 600 MG PO TB12: 1200.0000 mg | ORAL_TABLET | Freq: Two times a day (BID) | ORAL | Status: DC

## 2014-10-14 MED ORDER — BUTAMBEN-TETRACAINE-BENZOCAINE 2-2-14 % EX AERO
INHALATION_SPRAY | CUTANEOUS | Status: DC | PRN
  Administered 2014-10-14: 2 via TOPICAL

## 2014-10-14 MED ORDER — AZITHROMYCIN 250 MG PO TABS: 250.0000 mg | ORAL_TABLET | Freq: Once | ORAL | Status: DC

## 2014-10-14 MED ORDER — LIDOCAINE VISCOUS 2 % MT SOLN
OROMUCOSAL | Status: DC | PRN
  Administered 2014-10-14: 12 mL via OROMUCOSAL

## 2014-10-14 MED ORDER — ASPIRIN 325 MG PO TBEC: 325.0000 mg | DELAYED_RELEASE_TABLET | Freq: Every day | ORAL | Status: DC

## 2014-10-14 NOTE — CV Procedure (Signed)
EP Procedure Note  Procedure: Insertion of an ILR  Indication: cryptogenic stroke  Pre-procedure Diagnosis: cryptogenic stroke  Post-procedure Diagnosis: Same as preprocedure diagnosis  Description of the procedure: After informed consent was obtained, the patient was prepped and draped in a sterile fashion. 20 cc of lidocaine was infiltrated and a one cm stab incision made. The Medtronic ILR, serial [id number] was inserted into the subcutaneous space. R waves measured 0.1 mV. Benzoin and steristrips were painted on the skin and the patient recovered in the usual manner.   Complications: none immediately.  Conclusion: successful insertion of a Medtronic ILR in a patient with cryptogenic stroke.  Mikle Bosworth.D.

## 2014-10-14 NOTE — Interval H&P Note (Signed)
History and Physical Interval Note:  10/14/2014 11:08 AM  Aimee Jones  has presented today for surgery, with the diagnosis of stroke  The various methods of treatment have been discussed with the patient and family. After consideration of risks, benefits and other options for treatment, the patient has consented to  Procedure(s): LOOP RECORDER IMPLANT (N/A) as a surgical intervention .  The patient's history has been reviewed, patient examined, no change in status, stable for surgery.  I have reviewed the patient's chart and labs.  Questions were answered to the patient's satisfaction.     Aimee Jones

## 2014-10-14 NOTE — Interval H&P Note (Signed)
History and Physical Interval Note:  10/14/2014 9:02 AM  Aimee Jones  has presented today for surgery, with the diagnosis of stroke  The various methods of treatment have been discussed with the patient and family. After consideration of risks, benefits and other options for treatment, the patient has consented to  Procedure(s): TRANSESOPHAGEAL ECHOCARDIOGRAM (TEE) (N/A) as a surgical intervention .  The patient's history has been reviewed, patient examined, no change in status, stable for surgery.  I have reviewed the patient's chart and labs.  Questions were answered to the patient's satisfaction.     [name] R

## 2014-10-14 NOTE — CV Procedure (Addendum)
   PROCEDURE NOTE  Procedure:  Transesophageal echocardiogram Operator:  Fransico Him, MD Indications:  CVA Complications:  None IV Meds:  Versed 4mg , Fentanyl 6mcg IV  Results: Normal LV size and function Normal RV size and function Normal RA Normal LA and LAA Normal trileaflet AV Normal PV Normal TV with mild to moderate TR Normal MV with possible very mild mitral valve prolapse of the anterior leaflet with mild MR Normal ascending and thoracic aorta Normal interatrial septum with no evidence of PFO by colorflow doppler but there was evidence of flow right to left across the interatrial septum by the 5th cardiac cycle by agitated saline contrast injection.   Trivial posterior pericardia effusion.  The patient tolerated the procedure well and was transferred back to her room in stable condition.  Signed: Fransico Him, MD [facility] 10/14/2014

## 2014-10-14 NOTE — Consult Note (Signed)
ELECTROPHYSIOLOGY CONSULT NOTE  Patient ID: Aimee Jones MRN: [id number], DOB/AGE: 69-Nov-1947   Admit date: 10/12/2014 Date of Consult: 10/14/2014  Primary Physician: [name], MD Primary Cardiologist: new to [facility] Reason for Consultation: Cryptogenic stroke; recommendations regarding Implantable Loop Recorder  History of Present Illness Aimee Jones was admitted on 10/12/2014 with right facial droop and dysarthria.  Imaging demonstrated bilateral punctate PCA/occipital infarcts.  She has undergone workup for stroke including echocardiogram and carotid dopplers.  The patient has been monitored on telemetry which has demonstrated sinus rhythm with no arrhythmias.  Inpatient stroke work-up is to be completed with a TEE.   Echocardiogram this admission demonstrated EF 55-60%, no RWMA, grade 1 diastolic dysfunction, LA 38.  Lab work is reviewed.  Prior to admission, the patient denies chest pain, shortness of breath, dizziness, palpitations, or syncope.  They are recovering from their stroke with plans to return home at discharge.  [place] and is being [place] today.   EP has been asked to evaluate for placement of an implantable loop recorder to monitor for atrial fibrillation.  ROS is negative except as outlined above.    Past Medical History  Diagnosis Date  . Hyperlipidemia   . ETOH abuse   . Allergy   . Diverticulosis   . Hypertension   . Depression   . Asthma   . GERD (gastroesophageal reflux disease)   . Normal 24 hour ambulatory pH monitoring study     good acid suppression  . Dementia   . Shortness of breath   . Anxiety     panic attacks- on occas.   . Arthritis     osteoporosis - especially hips.      Surgical History:  Past Surgical History  Procedure Laterality Date  . Dobutamine stress echo  07/09/2001    normal  . Esophagogastroduodenoscopy  06/11/2006    normal  . Abdominal hysterectomy    .  Total hip arthroplasty      left 1991/right 2008  . Nasal sinus surgery  1990  . Eye surgery      cataracts removed, IOL- both eyes   . Breast surgery      3x- biposies   . Joint replacement    . Shoulder arthroscopy Left   . Reverse shoulder arthroplasty Right 08/13/2014    Procedure: REVERSE SHOULDER ARTHROPLASTY;  Surgeon: Marin Shutter, MD;  Location: [facility];  Service: Orthopedics;  Laterality: Right;  interscalene block     Prescriptions prior to admission  Medication Sig Dispense Refill Last Dose  . ALPRAZolam (XANAX) 0.5 MG tablet Take 0.5 mg by mouth 3 (three) times daily as needed.  5 10/11/2014 at Unknown time  . aspirin 81 MG tablet Take 81 mg by mouth daily.   10/11/2014 at Unknown time  . atorvastatin (LIPITOR) 10 MG tablet Take 10 mg by mouth daily at 6 PM.    10/11/2014 at Unknown time  . B Complex-C (SUPER B COMPLEX PO) Take 150 mg by mouth daily.    10/11/2014 at Unknown time  . budesonide-formoterol (SYMBICORT) 160-4.5 MCG/ACT inhaler Inhale 2 puffs into the lungs 2 (two) times daily as needed (shortness of breath). 1 Inhaler 12 10/11/2014 at Unknown time  . citalopram (CELEXA) 40 MG tablet Take 40 mg by mouth at bedtime.    10/11/2014 at Unknown time  . donepezil (ARICEPT) 10 MG tablet Take 10 mg by mouth daily before breakfast.  10/11/2014 at Unknown time  . doxepin (SINEQUAN) 25 MG capsule TAKE 1 CAPSULE BY MOUTH DAILY AT BEDTIME 30 capsule 2 Past Week at Unknown time  . fexofenadine (ALLEGRA) 180 MG tablet Take 180 mg by mouth daily before breakfast.    10/11/2014 at Unknown time  . fish oil-omega-3 fatty acids 1000 MG capsule Take 1 g by mouth daily.    10/11/2014 at Unknown time  . fluticasone (FLONASE) 50 MCG/ACT nasal spray Place 2 sprays into both nostrils daily.   10/11/2014 at Unknown time  . montelukast (SINGULAIR) 10 MG tablet Take 10 mg by mouth at bedtime.    10/11/2014 at Unknown time  . Multiple Vitamins-Minerals (MULTIVITAMIN PO) Take 1 tablet by mouth  daily.   10/11/2014 at Unknown time  . nabumetone (RELAFEN) 750 MG tablet Take 1 tablet (750 mg total) by mouth daily. With food. 30 tablet 2 10/11/2014 at Unknown time  . naproxen sodium (ALEVE) 220 MG tablet Take 220 mg by mouth 3 (three) times daily with meals.   10/12/2014 at Unknown time  . olmesartan-hydrochlorothiazide (BENICAR HCT) 40-25 MG per tablet Take 0.5 tablets by mouth at bedtime. (Patient taking differently: Take 1 tablet by mouth at bedtime. ) 45 tablet 1 10/11/2014 at Unknown time  . oxyCODONE-acetaminophen (PERCOCET) 5-325 MG per tablet Take 1-2 tablets by mouth every 4 (four) hours as needed. 60 tablet 0 10/12/2014 at Unknown time  . ranitidine (ZANTAC) 300 MG tablet Take 300 mg by mouth 2 (two) times daily.   10/11/2014 at Unknown time  . thiamine (VITAMIN B-1) 100 MG tablet Take 100 mg by mouth daily.   10/11/2014 at Unknown time  . VITAMIN E PO Take 1 capsule by mouth daily.    10/11/2014 at Unknown time  . diazepam (VALIUM) 5 MG tablet Take 0.5-1 tablets (2.5-5 mg total) by mouth every 6 (six) hours as needed for muscle spasms or sedation. (Patient not taking: Reported on 10/12/2014) 40 tablet 1 Not Taking at Unknown time    Inpatient Medications:  . aspirin EC  325 mg Oral Daily  . atorvastatin  10 mg Oral q1800  . azithromycin  500 mg Intravenous Q24H  . cefTRIAXone (ROCEPHIN)  IV  1 g Intravenous Q24H  . citalopram  40 mg Oral QHS  . fluticasone  2 spray Each Nare Daily  . guaiFENesin  1,200 mg Oral BID  . heparin  5,000 Units Subcutaneous 3 times per day  . irbesartan  300 mg Oral QHS   And  . hydrochlorothiazide  25 mg Oral QHS  . ipratropium-albuterol  3 mL Nebulization BID  . loratadine  10 mg Oral Daily    Allergies:  Allergies  Allergen Reactions  . Latex Rash  . Penicillins Rash    History   Social History  . Marital Status: Married    Spouse Name: N/A    Number of Children: N/A  . Years of Education: N/A   Occupational History  . retired      [employer]   Social History Main Topics  . Smoking status: Never Smoker   . Smokeless tobacco: Never Used  . Alcohol Use: Yes     Comment: grand Mariener - almost daily- 1/2 shot  . Drug Use: No  . Sexual Activity: Yes   Other Topics Concern  . Not on file   Social History Narrative   Married. Retired.       Family History  Problem Relation Age of Onset  . Sudden death  Brother   . Heart attack Brother   . Hypertension Brother   . Esophageal cancer Brother   . Heart attack Father   . Heart block Mother   . Stroke Brother   . Stroke Mother      Physical Exam: Filed Vitals:   10/14/14 0000 10/14/14 0058 10/14/14 0400 10/14/14 0443  BP:  99/59  112/71  Pulse:  78  76  Temp:  98 F (36.7 C)  98 F (36.7 C)  TempSrc:  Oral  Oral  Resp: 17 18 18 18   Height:      Weight:      SpO2:  95%  95%   GEN- The patient is well appearing, alert and oriented x 3 today.   Head- normocephalic, atraumatic Eyes-  Sclera clear, conjunctiva pink Ears- hearing intact Oropharynx- clear Neck- supple, Lungs- Clear to ausculation bilaterally, normal work of breathing Heart- Regular rate and rhythm, no murmurs, rubs or gallops, PMI not laterally displaced GI- soft, NT, ND, + BS Extremities- no clubbing, cyanosis, or edema MS- no significant deformity or atrophy Skin- no rash or lesion Psych- euthymic mood, full affect   Labs:   Lab Results  Component Value Date   WBC 23.1* 10/12/2014   HGB 12.3 10/12/2014   HCT 39.3 10/12/2014   MCV 80.7 10/12/2014   PLT 543* 10/12/2014     Recent Labs Lab 10/12/14 1209  NA 134*  K 3.9  CL 104  CO2 22  BUN 33*  CREATININE 1.37*  CALCIUM 9.8  PROT 6.8  BILITOT 0.7  ALKPHOS 58  ALT 14  AST 25  GLUCOSE 110*    Radiology/Studies: Dg Chest 2 View 10/12/2014   CLINICAL DATA:  Cough and difficulty breathing  EXAM: CHEST  2 VIEW  COMPARISON:  October 19, 2009  FINDINGS: There is atelectasis in the left base. Elsewhere lungs are  clear. Heart size and pulmonary vascularity are normal. No adenopathy. 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  IMPRESSION: Left base atelectasis.  Elsewhere lungs clear.   Electronically Signed   By: Lowella Grip M.D.   On: 10/12/2014 21:31   Ct Head Wo Contrast 10/12/2014   CLINICAL DATA:  Code stroke.  Dizziness.  Right facial droop.  EXAM: CT HEAD WITHOUT CONTRAST  TECHNIQUE: Contiguous axial images were obtained from the base of the skull through the vertex without intravenous contrast.  COMPARISON:  Brain MRI 05/29/2014  FINDINGS: Atrophy and chronic small vessel ischemic change, stable from prior. No intracranial hemorrhage, mass effect, or midline shift. No hydrocephalus. The basilar cisterns are patent. No evidence of territorial infarct. No intracranial fluid collection. Calvarium is intact. Included paranasal sinuses and mastoid air cells are well aerated.  IMPRESSION: Stable atrophy and chronic small vessel ischemic change. No evidence of acute intracranial abnormality.  These results were called by telephone at the time of interpretation on 10/12/2014 at 11:53 am to Dr. Dr Armida Sans , who verbally acknowledged these results.   Electronically Signed   By: Jeb Levering M.D.   On: 10/12/2014 11:55   Mr Jodene Nam Head Wo Contrast 10/12/2014   CLINICAL DATA:  Stroke.  Hypertension and hyperlipidemia  EXAM: MRI HEAD WITHOUT CONTRAST  MRA HEAD WITHOUT CONTRAST  TECHNIQUE: Multiplanar, multiecho pulse sequences of the brain and surrounding structures were obtained without intravenous contrast. Angiographic images of the head were obtained using MRA technique without contrast.  COMPARISON:  CT head 10/12/2014  FINDINGS: MRI HEAD FINDINGS  Small areas of acute  infarct in the left occipital cortex. Probable small area of acute infarct in the right occipital cortex. No other areas of acute infarct.  Generalized atrophy. Moderate chronic microvascular ischemic changes similar to the prior MRI.   Negative for hemorrhage  Negative for mass or edema.  No shift of the midline structures  Minimal mucosal edema in the paranasal sinuses.  MRA HEAD FINDINGS  Both vertebral arteries widely patent. PICA patent bilaterally. Superior cerebellar, and posterior cerebral arteries widely patent. Basilar widely patent.  Internal carotid artery widely patent bilaterally. Anterior and middle cerebral arteries widely patent bilaterally.  Negative for cerebral aneurysm.  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  Atrophy and chronic microvascular ischemic changes are stable from the prior MRI  Negative MRA head.   Electronically Signed   By: Franchot Gallo M.D.   On: 10/12/2014 14:49   12-lead ECG sinus rhythm, rate 70, normal intervals, poor R wave progression  Telemetry sinus rhythm with no arrhythmias  Assessment and Plan:  1. Cryptogenic stroke The patient presents with cryptogenic stroke.  The patient has a TEE planned for this AM.  I spoke at length with the patient about monitoring for afib with either a 30 day event monitor or an implantable loop recorder.  Risks, benefits, and alteratives to implantable loop recorder were discussed with the patient today.   At this time, the patient is very clear in their decision to proceed with implantable loop recorder.   Wound care was reviewed with the patient (keep incision clean and dry for 3 days).  Wound check scheduled for 10-26-14 at 11AM at [facility]  Please call with questions.  Mikle Bosworth.D.

## 2014-10-14 NOTE — Progress Notes (Signed)
Discharge orders received, pt for discharge home today, IV and telemetry  D/C.  D/C instructions and Rx given with verbalized understanding.  Family at bedside to assist pt with discharge. Staff brought pt downstairs via wheelchair.  

## 2014-10-14 NOTE — Discharge Summary (Signed)
Physician Discharge Summary  Aimee Jones NID:[id number] DOB: [birth year] DOA: 10/12/2014  PCP: [name], MD  Admit date: 10/12/2014 Discharge date: 10/14/2014  Time spent: 45 minutes  Recommendations for Outpatient Follow-up:  Patient will be discharged home. She is to follow-up with her primary care physician within 1-2 weeks of discharge. Patient should also follow up with neurology within 2 months of discharge. Patient will also need to follow up with cardiology for wound check on 10/26/2014. Patient to continue her medications as prescribed. Patient should resume a heart healthy diet. She may resume activity as tolerated.  Discharge Diagnoses:  Acute CVA Acute bronchitis CTD, stage III GERD Hypertension Hyperlipidemia Mild dementia   Discharge Condition: Stable  Diet recommendation: Heart healthy  Filed Weights   10/12/14 1845  Weight: 78.654 kg (173 lb 6.4 oz)    History of present illness:  On 10/12/2014 by Dr. Verlee Monte Aimee Jones is a 68 y.o. female with history of hypertension came into the hospital because of fall. Patient said she's been having symptoms of cough, sputum production and shortness of breath for the past 10 days, but no fever. Earlier today she felt dizzy and was about to fall, then she told her husband that she has numbness in her right hand so she was brought to the ED for further evaluation. In the ED she was found to have subtle right-sided facial droop, CT scan without contrast is negative for changes. Patient seen by neurology and recommended admission to rule out acute stroke   Hospital Course:  Acute CVA -MRI brain: Small areas of acute cortical infarct in the occipital lobe -Neurology consultation appreciated -Echocardiogram: No cardiac source of emboli was notified, EF 14-43%, grade 1 diastolic dysfunction -Carotid Doppler: 1-39% ICA stenosis, vertebral artery flow is antegrade -LE doppler: negative for DVT -Neurology  recommended TEE -TEE on 10/14/2014: No evidence of PFO -Loop recorder implanted 10/14/2014 -PT and OT - patient did not need further therapy -HbA1c 5.9 -Continue Aspirin 325mg  daily  Acute bronchitis -Patient has had a productive cough and shortness of breath, started on Rocephin and azithromycin empirically -Will discharge patient with azithromycin -Chest x-ray: left base atelectasis -Continue supportive management with bronchodilators and mucolytics, antitussives and oxygen as needed  CKD, stage III -Creatinine appears at baseline   GERD -Continue PPI  Hypertension -Stable continue home medications  Hyperlipidemia -Continue statin, LDL 72  Mild dementia -Stable  Leukocytosis -Likely secondary to the above -WBC trending downward -No source of infection -Patient afebrile  Procedures: Echocardiogram TEE Carotid doppler  Consultations: Neurology Cardiology  Discharge Exam: Filed Vitals:   10/14/14 0936  BP: 96/54  Pulse: 77  Temp:   Resp: 13     General: Well developed, well nourished, NAD, appears stated age  HEENT: NCAT, mucous membranes moist.  Cardiovascular: S1 S2 auscultated, no rubs, murmurs or gallops.  Respiratory: Clear to auscultation bilaterally with equal chest rise  Abdomen: Soft, nontender, nondistended, + bowel sounds  Extremities: warm dry without cyanosis clubbing or edema  Neuro: AAOx3, no focal deficits  Psych: Normal affect and demeanor with intact judgement and insight  Discharge Instructions     Medication List    ASK your doctor about these medications        ALEVE 220 MG tablet  Generic drug:  naproxen sodium  Take 220 mg by mouth 3 (three) times daily with meals.     ALPRAZolam 0.5 MG tablet  Commonly known as:  XANAX  Take 0.5 mg by mouth  3 (three) times daily as needed.     aspirin 81 MG tablet  Take 81 mg by mouth daily.     atorvastatin 10 MG tablet  Commonly known as:  LIPITOR  Take 10 mg by mouth  daily at 6 PM.     budesonide-formoterol 160-4.5 MCG/ACT inhaler  Commonly known as:  SYMBICORT  Inhale 2 puffs into the lungs 2 (two) times daily as needed (shortness of breath).     citalopram 40 MG tablet  Commonly known as:  CELEXA  Take 40 mg by mouth at bedtime.     diazepam 5 MG tablet  Commonly known as:  VALIUM  Take 0.5-1 tablets (2.5-5 mg total) by mouth every 6 (six) hours as needed for muscle spasms or sedation.     donepezil 10 MG tablet  Commonly known as:  ARICEPT  Take 10 mg by mouth daily before breakfast.     doxepin 25 MG capsule  Commonly known as:  SINEQUAN  TAKE 1 CAPSULE BY MOUTH DAILY AT BEDTIME     fexofenadine 180 MG tablet  Commonly known as:  ALLEGRA  Take 180 mg by mouth daily before breakfast.     fish oil-omega-3 fatty acids 1000 MG capsule  Take 1 g by mouth daily.     fluticasone 50 MCG/ACT nasal spray  Commonly known as:  FLONASE  Place 2 sprays into both nostrils daily.     montelukast 10 MG tablet  Commonly known as:  SINGULAIR  Take 10 mg by mouth at bedtime.     MULTIVITAMIN PO  Take 1 tablet by mouth daily.     nabumetone 750 MG tablet  Commonly known as:  RELAFEN  Take 1 tablet (750 mg total) by mouth daily. With food.     olmesartan-hydrochlorothiazide 40-25 MG per tablet  Commonly known as:  BENICAR HCT  Take 0.5 tablets by mouth at bedtime.     oxyCODONE-acetaminophen 5-325 MG per tablet  Commonly known as:  PERCOCET  Take 1-2 tablets by mouth every 4 (four) hours as needed.     ranitidine 300 MG tablet  Commonly known as:  ZANTAC  Take 300 mg by mouth 2 (two) times daily.     SUPER B COMPLEX PO  Take 150 mg by mouth daily.     thiamine 100 MG tablet  Commonly known as:  VITAMIN B-1  Take 100 mg by mouth daily.     VITAMIN E PO  Take 1 capsule by mouth daily.       Allergies  Allergen Reactions  . Latex Rash  . Penicillins Rash      The results of significant diagnostics from this hospitalization  (including imaging, microbiology, ancillary and laboratory) are listed below for reference.    Significant Diagnostic Studies: Dg Chest 2 View  10/12/2014   CLINICAL DATA:  Cough and difficulty breathing  EXAM: CHEST  2 VIEW  COMPARISON:  October 19, 2009  FINDINGS: There is atelectasis in the left base. Elsewhere lungs are clear. Heart size and pulmonary vascularity are normal. No adenopathy. 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  IMPRESSION: Left base atelectasis.  Elsewhere lungs clear.   Electronically Signed   By: Lowella Grip M.D.   On: 10/12/2014 21:31   Ct Head Wo Contrast  10/12/2014   CLINICAL DATA:  Code stroke.  Dizziness.  Right facial droop.  EXAM: CT HEAD WITHOUT CONTRAST  TECHNIQUE: Contiguous axial images were obtained from the base of the  skull through the vertex without intravenous contrast.  COMPARISON:  Brain MRI 05/29/2014  FINDINGS: Atrophy and chronic small vessel ischemic change, stable from prior. No intracranial hemorrhage, mass effect, or midline shift. No hydrocephalus. The basilar cisterns are patent. No evidence of territorial infarct. No intracranial fluid collection. Calvarium is intact. Included paranasal sinuses and mastoid air cells are well aerated.  IMPRESSION: Stable atrophy and chronic small vessel ischemic change. No evidence of acute intracranial abnormality.  These results were called by telephone at the time of interpretation on 10/12/2014 at 11:53 am to Dr. Dr Armida Sans , who verbally acknowledged these results.   Electronically Signed   By: Jeb Levering M.D.   On: 10/12/2014 11:55   Mr Jodene Nam Head Wo Contrast  10/12/2014   CLINICAL DATA:  Stroke.  Hypertension and hyperlipidemia  EXAM: MRI HEAD WITHOUT CONTRAST  MRA HEAD WITHOUT CONTRAST  TECHNIQUE: Multiplanar, multiecho pulse sequences of the brain and surrounding structures were obtained without intravenous contrast. Angiographic images of the head were obtained using MRA technique without  contrast.  COMPARISON:  CT head 10/12/2014  FINDINGS: MRI HEAD FINDINGS  Small areas of acute infarct in the left occipital cortex. Probable small area of acute infarct in the right occipital cortex. No other areas of acute infarct.  Generalized atrophy. Moderate chronic microvascular ischemic changes similar to the prior MRI.  Negative for hemorrhage  Negative for mass or edema.  No shift of the midline structures  Minimal mucosal edema in the paranasal sinuses.  MRA HEAD FINDINGS  Both vertebral arteries widely patent. PICA patent bilaterally. Superior cerebellar, and posterior cerebral arteries widely patent. Basilar widely patent.  Internal carotid artery widely patent bilaterally. Anterior and middle cerebral arteries widely patent bilaterally.  Negative for cerebral aneurysm.  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  Atrophy and chronic microvascular ischemic changes are stable from the prior MRI  Negative MRA head.   Electronically Signed   By: Franchot Gallo M.D.   On: 10/12/2014 14:49   Mr Brain Wo Contrast  10/12/2014   CLINICAL DATA:  Stroke.  Hypertension and hyperlipidemia  EXAM: MRI HEAD WITHOUT CONTRAST  MRA HEAD WITHOUT CONTRAST  TECHNIQUE: Multiplanar, multiecho pulse sequences of the brain and surrounding structures were obtained without intravenous contrast. Angiographic images of the head were obtained using MRA technique without contrast.  COMPARISON:  CT head 10/12/2014  FINDINGS: MRI HEAD FINDINGS  Small areas of acute infarct in the left occipital cortex. Probable small area of acute infarct in the right occipital cortex. No other areas of acute infarct.  Generalized atrophy. Moderate chronic microvascular ischemic changes similar to the prior MRI.  Negative for hemorrhage  Negative for mass or edema.  No shift of the midline structures  Minimal mucosal edema in the paranasal sinuses.  MRA HEAD FINDINGS  Both vertebral arteries  widely patent. PICA patent bilaterally. Superior cerebellar, and posterior cerebral arteries widely patent. Basilar widely patent.  Internal carotid artery widely patent bilaterally. Anterior and middle cerebral arteries widely patent bilaterally.  Negative for cerebral aneurysm.  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  Atrophy and chronic microvascular ischemic changes are stable from the prior MRI  Negative MRA head.   Electronically Signed   By: Franchot Gallo M.D.   On: 10/12/2014 14:49    Microbiology: No results found for this or any previous visit (from the past 240 hour(s)).   Labs: Basic  Metabolic Panel:  Recent Labs Lab 10/12/14 1157 10/12/14 1209  NA 137 134*  K 3.9 3.9  CL 104 104  CO2  --  22  GLUCOSE 111* 110*  BUN 36* 33*  CREATININE 1.40* 1.37*  CALCIUM  --  9.8   Liver Function Tests:  Recent Labs Lab 10/12/14 1209  AST 25  ALT 14  ALKPHOS 58  BILITOT 0.7  PROT 6.8  ALBUMIN 3.5   No results for input(s): LIPASE, AMYLASE in the last 168 hours. No results for input(s): AMMONIA in the last 168 hours. CBC:  Recent Labs Lab 10/12/14 1157 10/12/14 1209 10/14/14 0720  WBC  --  23.1* 11.1*  NEUTROABS  --  16.7*  --   HGB 15.6* 12.3 10.7*  HCT 46.0 39.3 34.8*  MCV  --  80.7 81.1  PLT  --  543* 505*   Cardiac Enzymes: No results for input(s): CKTOTAL, CKMB, CKMBINDEX, TROPONINI in the last 168 hours. BNP: BNP (last 3 results) No results for input(s): PROBNP in the last 8760 hours. CBG: No results for input(s): GLUCAP in the last 168 hours.     SignedCristal Jones  [facility] 10/14/2014, 9:40 AM

## 2014-10-14 NOTE — Evaluation (Signed)
Occupational Therapy Evaluation Patient Details Name: Aimee Jones MRN: [id number] DOB: 1946-08-24 Today's Date: 10/14/2014    History of Present Illness Patient is a 68 y/o female who presents from home s/p fall. Pt said she's been having symptoms of cough, sputum production and shortness of breath for the past 10 days, but no fever. Earlier today (12/28) she felt dizzy and was about to fall, then she told her husband that she has numbness in her right hand so she was brought to the ED for further evaluation. MRI-Small areas of acute cortical infarct in the occipital lobe, bilaterally, probable emboli.Marland Kitchen NIHSS:2.   Clinical Impression   Patient evaluated by Occupational Therapy with no further acute OT needs identified. All education has been completed and the patient has no further questions. See below for any follow-up Occupational Therapy or equipment needs. OT to sign off. Thank you for referral.      Follow Up Recommendations  No OT follow up    Equipment Recommendations  None recommended by OT    Recommendations for Other Services       Precautions / Restrictions Precautions Precautions: None      Mobility Bed Mobility Overal bed mobility: Independent                Transfers Overall transfer level: Modified independent                    Balance                               High Level Balance Comments: pt static standing with eyes closed for 10 seconds, single leg standing            ADL Overall ADL's : At baseline                                             Vision Eye Alignment: Within Functional Limits   Ocular Range of Motion: Within Functional Limits Tracking/Visual Pursuits: Able to track stimulus in all quads without difficulty Saccades: Within functional limits Convergence: Within functional limits         Perception     Praxis      Pertinent Vitals/Pain Pain Assessment: No/denies  pain     Hand Dominance Right   Extremity/Trunk Assessment Upper Extremity Assessment Upper Extremity Assessment: Overall WFL for tasks assessed   Lower Extremity Assessment Lower Extremity Assessment: Overall WFL for tasks assessed   Cervical / Trunk Assessment Cervical / Trunk Assessment: Normal   Communication Communication Communication: No difficulties   Cognition Arousal/Alertness: Awake/alert Behavior During Therapy: WFL for tasks assessed/performed Overall Cognitive Status: History of cognitive impairments - at baseline                     General Comments       Exercises       Shoulder Instructions      Home Living Family/patient expects to be discharged to:: Private residence Living Arrangements: Alone Available Help at Discharge: Family;Available 24 hours/day Type of Home: House Home Access: Stairs to enter [employer] of Steps: 5 Entrance Stairs-Rails: Right;Left Home Layout: One level     Bathroom Shower/Tub: Occupational psychologist: Standard     Home Equipment: Environmental consultant - 2 wheels;Cane - single point;Crutches;Walker -  4 wheels          Prior Functioning/Environment Level of Independence: Independent        Comments: Husband drives.     OT Diagnosis:     OT Problem List:     OT Treatment/Interventions:      OT Goals(Current goals can be found in the care plan section)    OT Frequency:     Barriers to D/C:            Co-evaluation              End of Session    Activity Tolerance: Patient tolerated treatment well Patient left: in bed;with call bell/phone within reach;with family/visitor present   Time: 1020-1030 OT Time Calculation (min): 10 min Charges:  OT General Charges $OT Visit: 1 Procedure OT Evaluation $Initial OT Evaluation Tier I: 1 Procedure G-Codes:    Parke Poisson B Oct 24, 2014, 11:20 AM Pager: [phone]

## 2014-10-14 NOTE — Progress Notes (Signed)
  Echocardiogram Echocardiogram Transesophageal has been performed.  Aimee Jones 10/14/2014, 9:53 AM

## 2014-10-14 NOTE — H&P (View-Only) (Signed)
ELECTROPHYSIOLOGY CONSULT NOTE  Patient ID: Aimee Jones MRN: [id number], DOB/AGE: [birth year]   Admit date: 10/12/2014 Date of Consult: 10/14/2014  Primary Physician: Aimee Stain, MD Primary Cardiologist: new to [facility] Reason for Consultation: Cryptogenic stroke; recommendations regarding Implantable Loop Recorder  History of Present Illness Aimee Jones was admitted on 10/12/2014 with right facial droop and dysarthria.  Imaging demonstrated bilateral punctate PCA/occipital infarcts.  She has undergone workup for stroke including echocardiogram and carotid dopplers.  The patient has been monitored on telemetry which has demonstrated sinus rhythm with no arrhythmias.  Inpatient stroke work-up is to be completed with a TEE.   Echocardiogram this admission demonstrated EF 55-60%, no RWMA, grade 1 diastolic dysfunction, LA 38.  Lab work is reviewed.  Prior to admission, the patient denies chest pain, shortness of breath, dizziness, palpitations, or syncope.  They are recovering from their stroke with plans to return home at discharge.  [place] and is being [place] today.   EP has been asked to evaluate for placement of an implantable loop recorder to monitor for atrial fibrillation.  ROS is negative except as outlined above.    Past Medical History  Diagnosis Date  . Hyperlipidemia   . ETOH abuse   . Allergy   . Diverticulosis   . Hypertension   . Depression   . Asthma   . GERD (gastroesophageal reflux disease)   . Normal 24 hour ambulatory pH monitoring study     good acid suppression  . Dementia   . Shortness of breath   . Anxiety     panic attacks- on occas.   . Arthritis     osteoporosis - especially hips.      Surgical History:  Past Surgical History  Procedure Laterality Date  . Dobutamine stress echo  07/09/2001    normal  . Esophagogastroduodenoscopy  06/11/2006    normal  . Abdominal hysterectomy    .  Total hip arthroplasty      left 1991/right 2008  . Nasal sinus surgery  1990  . Eye surgery      cataracts removed, IOL- both eyes   . Breast surgery      3x- biposies   . Joint replacement    . Shoulder arthroscopy Left   . Reverse shoulder arthroplasty Right 08/13/2014    Procedure: REVERSE SHOULDER ARTHROPLASTY;  Surgeon: Aimee Shutter, MD;  Location: [place];  Service: Orthopedics;  Laterality: Right;  interscalene block     Prescriptions prior to admission  Medication Sig Dispense Refill Last Dose  . ALPRAZolam (XANAX) 0.5 MG tablet Take 0.5 mg by mouth 3 (three) times daily as needed.  5 10/11/2014 at Unknown time  . aspirin 81 MG tablet Take 81 mg by mouth daily.   10/11/2014 at Unknown time  . atorvastatin (LIPITOR) 10 MG tablet Take 10 mg by mouth daily at 6 PM.    10/11/2014 at Unknown time  . B Complex-C (SUPER B COMPLEX PO) Take 150 mg by mouth daily.    10/11/2014 at Unknown time  . budesonide-formoterol (SYMBICORT) 160-4.5 MCG/ACT inhaler Inhale 2 puffs into the lungs 2 (two) times daily as needed (shortness of breath). 1 Inhaler 12 10/11/2014 at Unknown time  . citalopram (CELEXA) 40 MG tablet Take 40 mg by mouth at bedtime.    10/11/2014 at Unknown time  . donepezil (ARICEPT) 10 MG tablet Take 10 mg by mouth daily before breakfast.  10/11/2014 at Unknown time  . doxepin (SINEQUAN) 25 MG capsule TAKE 1 CAPSULE BY MOUTH DAILY AT BEDTIME 30 capsule 2 Past Week at Unknown time  . fexofenadine (ALLEGRA) 180 MG tablet Take 180 mg by mouth daily before breakfast.    10/11/2014 at Unknown time  . fish oil-omega-3 fatty acids 1000 MG capsule Take 1 g by mouth daily.    10/11/2014 at Unknown time  . fluticasone (FLONASE) 50 MCG/ACT nasal spray Place 2 sprays into both nostrils daily.   10/11/2014 at Unknown time  . montelukast (SINGULAIR) 10 MG tablet Take 10 mg by mouth at bedtime.    10/11/2014 at Unknown time  . Multiple Vitamins-Minerals (MULTIVITAMIN PO) Take 1 tablet by mouth  daily.   10/11/2014 at Unknown time  . nabumetone (RELAFEN) 750 MG tablet Take 1 tablet (750 mg total) by mouth daily. With food. 30 tablet 2 10/11/2014 at Unknown time  . naproxen sodium (ALEVE) 220 MG tablet Take 220 mg by mouth 3 (three) times daily with meals.   10/12/2014 at Unknown time  . olmesartan-hydrochlorothiazide (BENICAR HCT) 40-25 MG per tablet Take 0.5 tablets by mouth at bedtime. (Patient taking differently: Take 1 tablet by mouth at bedtime. ) 45 tablet 1 10/11/2014 at Unknown time  . oxyCODONE-acetaminophen (PERCOCET) 5-325 MG per tablet Take 1-2 tablets by mouth every 4 (four) hours as needed. 60 tablet 0 10/12/2014 at Unknown time  . ranitidine (ZANTAC) 300 MG tablet Take 300 mg by mouth 2 (two) times daily.   10/11/2014 at Unknown time  . thiamine (VITAMIN B-1) 100 MG tablet Take 100 mg by mouth daily.   10/11/2014 at Unknown time  . VITAMIN E PO Take 1 capsule by mouth daily.    10/11/2014 at Unknown time  . diazepam (VALIUM) 5 MG tablet Take 0.5-1 tablets (2.5-5 mg total) by mouth every 6 (six) hours as needed for muscle spasms or sedation. (Patient not taking: Reported on 10/12/2014) 40 tablet 1 Not Taking at Unknown time    Inpatient Medications:  . aspirin EC  325 mg Oral Daily  . atorvastatin  10 mg Oral q1800  . azithromycin  500 mg Intravenous Q24H  . cefTRIAXone (ROCEPHIN)  IV  1 g Intravenous Q24H  . citalopram  40 mg Oral QHS  . fluticasone  2 spray Each Nare Daily  . guaiFENesin  1,200 mg Oral BID  . heparin  5,000 Units Subcutaneous 3 times per day  . irbesartan  300 mg Oral QHS   And  . hydrochlorothiazide  25 mg Oral QHS  . ipratropium-albuterol  3 mL Nebulization BID  . loratadine  10 mg Oral Daily    Allergies:  Allergies  Allergen Reactions  . Latex Rash  . Penicillins Rash    History   Social History  . Marital Status: Married    Spouse Name: N/A    Number of Children: N/A  . Years of Education: N/A   Occupational History  . retired      [employer]   Social History Main Topics  . Smoking status: Never Smoker   . Smokeless tobacco: Never Used  . Alcohol Use: Yes     Comment: grand Mariener - almost daily- 1/2 shot  . Drug Use: No  . Sexual Activity: Yes   Other Topics Concern  . Not on file   Social History Narrative   Married. Retired.       Family History  Problem Relation Age of Onset  . Sudden death  Brother   . Heart attack Brother   . Hypertension Brother   . Esophageal cancer Brother   . Heart attack Father   . Heart block Mother   . Stroke Brother   . Stroke Mother      Physical Exam: Filed Vitals:   10/14/14 0000 10/14/14 0058 10/14/14 0400 10/14/14 0443  BP:  99/59  112/71  Pulse:  78  76  Temp:  98 F (36.7 C)  98 F (36.7 C)  TempSrc:  Oral  Oral  Resp: 17 18 18 18   Height:      Weight:      SpO2:  95%  95%   GEN- The patient is well appearing, alert and oriented x 3 today.   Head- normocephalic, atraumatic Eyes-  Sclera clear, conjunctiva pink Ears- hearing intact Oropharynx- clear Neck- supple, Lungs- Clear to ausculation bilaterally, normal work of breathing Heart- Regular rate and rhythm, no murmurs, rubs or gallops, PMI not laterally displaced GI- soft, NT, ND, + BS Extremities- no clubbing, cyanosis, or edema MS- no significant deformity or atrophy Skin- no rash or lesion Psych- euthymic mood, full affect   Labs:   Lab Results  Component Value Date   WBC 23.1* 10/12/2014   HGB 12.3 10/12/2014   HCT 39.3 10/12/2014   MCV 80.7 10/12/2014   PLT 543* 10/12/2014     Recent Labs Lab 10/12/14 1209  NA 134*  K 3.9  CL 104  CO2 22  BUN 33*  CREATININE 1.37*  CALCIUM 9.8  PROT 6.8  BILITOT 0.7  ALKPHOS 58  ALT 14  AST 25  GLUCOSE 110*    Radiology/Studies: Dg Chest 2 View 10/12/2014   CLINICAL DATA:  Cough and difficulty breathing  EXAM: CHEST  2 VIEW  COMPARISON:  October 19, 2009  FINDINGS: There is atelectasis in the left base. Elsewhere lungs are  clear. Heart size and pulmonary vascularity are normal. No adenopathy. 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  IMPRESSION: Left base atelectasis.  Elsewhere lungs clear.   Electronically Signed   By: Lowella Grip M.D.   On: 10/12/2014 21:31   Ct Head Wo Contrast 10/12/2014   CLINICAL DATA:  Code stroke.  Dizziness.  Right facial droop.  EXAM: CT HEAD WITHOUT CONTRAST  TECHNIQUE: Contiguous axial images were obtained from the base of the skull through the vertex without intravenous contrast.  COMPARISON:  Brain MRI 05/29/2014  FINDINGS: Atrophy and chronic small vessel ischemic change, stable from prior. No intracranial hemorrhage, mass effect, or midline shift. No hydrocephalus. The basilar cisterns are patent. No evidence of territorial infarct. No intracranial fluid collection. Calvarium is intact. Included paranasal sinuses and mastoid air cells are well aerated.  IMPRESSION: Stable atrophy and chronic small vessel ischemic change. No evidence of acute intracranial abnormality.  These results were called by telephone at the time of interpretation on 10/12/2014 at 11:53 am to Dr. Dr Armida Sans , who verbally acknowledged these results.   Electronically Signed   By: Jeb Levering M.D.   On: 10/12/2014 11:55   Mr Jodene Nam Head Wo Contrast 10/12/2014   CLINICAL DATA:  Stroke.  Hypertension and hyperlipidemia  EXAM: MRI HEAD WITHOUT CONTRAST  MRA HEAD WITHOUT CONTRAST  TECHNIQUE: Multiplanar, multiecho pulse sequences of the brain and surrounding structures were obtained without intravenous contrast. Angiographic images of the head were obtained using MRA technique without contrast.  COMPARISON:  CT head 10/12/2014  FINDINGS: MRI HEAD FINDINGS  Small areas of acute  infarct in the left occipital cortex. Probable small area of acute infarct in the right occipital cortex. No other areas of acute infarct.  Generalized atrophy. Moderate chronic microvascular ischemic changes similar to the prior MRI.   Negative for hemorrhage  Negative for mass or edema.  No shift of the midline structures  Minimal mucosal edema in the paranasal sinuses.  MRA HEAD FINDINGS  Both vertebral arteries widely patent. PICA patent bilaterally. Superior cerebellar, and posterior cerebral arteries widely patent. Basilar widely patent.  Internal carotid artery widely patent bilaterally. Anterior and middle cerebral arteries widely patent bilaterally.  Negative for cerebral aneurysm.  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  Atrophy and chronic microvascular ischemic changes are stable from the prior MRI  Negative MRA head.   Electronically Signed   By: Franchot Gallo M.D.   On: 10/12/2014 14:49   12-lead ECG sinus rhythm, rate 70, normal intervals, poor R wave progression  Telemetry sinus rhythm with no arrhythmias  Assessment and Plan:  1. Cryptogenic stroke The patient presents with cryptogenic stroke.  The patient has a TEE planned for this AM.  I spoke at length with the patient about monitoring for afib with either a 30 day event monitor or an implantable loop recorder.  Risks, benefits, and alteratives to implantable loop recorder were discussed with the patient today.   At this time, the patient is very clear in their decision to proceed with implantable loop recorder.   Wound care was reviewed with the patient (keep incision clean and dry for 3 days).  Wound check scheduled for 10-26-14 at 11AM at [facility]  Please call with questions.  Mikle [email or web address].D.

## 2014-10-15 ENCOUNTER — Encounter (HOSPITAL_COMMUNITY): Payer: Self-pay | Admitting: Cardiology

## 2014-10-15 DIAGNOSIS — J3089 Other allergic rhinitis: Secondary | ICD-10-CM | POA: Diagnosis not present

## 2014-10-15 DIAGNOSIS — J3081 Allergic rhinitis due to animal (cat) (dog) hair and dander: Secondary | ICD-10-CM | POA: Diagnosis not present

## 2014-10-19 ENCOUNTER — Ambulatory Visit (INDEPENDENT_AMBULATORY_CARE_PROVIDER_SITE_OTHER)
Admission: RE | Admit: 2014-10-19 | Discharge: 2014-10-19 | Disposition: A | Discharge status: Home / Self Care | Payer: Medicare Other | Source: Ambulatory Visit | Attending: Family Medicine | Admitting: Family Medicine

## 2014-10-19 ENCOUNTER — Ambulatory Visit (INDEPENDENT_AMBULATORY_CARE_PROVIDER_SITE_OTHER): Payer: Medicare Other | Admitting: Family Medicine

## 2014-10-19 ENCOUNTER — Encounter: Payer: Self-pay | Admitting: Family Medicine

## 2014-10-19 VITALS — BP 104/62 | HR 83 | Temp 97.7°F | Wt 173.8 lb

## 2014-10-19 DIAGNOSIS — J189 Pneumonia, unspecified organism: Secondary | ICD-10-CM

## 2014-10-19 DIAGNOSIS — M25511 Pain in right shoulder: Secondary | ICD-10-CM

## 2014-10-19 DIAGNOSIS — M25512 Pain in left shoulder: Secondary | ICD-10-CM | POA: Diagnosis not present

## 2014-10-19 DIAGNOSIS — I6330 Cerebral infarction due to thrombosis of unspecified cerebral artery: Secondary | ICD-10-CM | POA: Diagnosis not present

## 2014-10-19 DIAGNOSIS — J9811 Atelectasis: Secondary | ICD-10-CM | POA: Diagnosis not present

## 2014-10-19 LAB — CBC WITH DIFFERENTIAL/PLATELET
BASOS ABS: 0.2 10*3/uL — AB (ref 0.0–0.1)
Basophils Relative: 1.3 % (ref 0.0–3.0)
Eosinophils Absolute: 0.9 10*3/uL — ABNORMAL HIGH (ref 0.0–0.7)
Eosinophils Relative: 6 % — ABNORMAL HIGH (ref 0.0–5.0)
HEMATOCRIT: 38.2 % (ref 36.0–46.0)
Hemoglobin: 11.9 g/dL — ABNORMAL LOW (ref 12.0–15.0)
LYMPHS ABS: 3.1 10*3/uL (ref 0.7–4.0)
LYMPHS PCT: 21.6 % (ref 12.0–46.0)
MCHC: 31.2 g/dL (ref 30.0–36.0)
MCV: 80.1 fl (ref 78.0–100.0)
Monocytes Absolute: 0.9 10*3/uL (ref 0.1–1.0)
Monocytes Relative: 6.5 % (ref 3.0–12.0)
Neutro Abs: 9.3 10*3/uL — ABNORMAL HIGH (ref 1.4–7.7)
Neutrophils Relative %: 64.6 % (ref 43.0–77.0)
RBC: 4.77 Mil/uL (ref 3.87–5.11)
RDW: 17.4 % — AB (ref 11.5–15.5)
WBC: 14.5 10*3/uL — ABNORMAL HIGH (ref 4.0–10.5)

## 2014-10-19 LAB — BASIC METABOLIC PANEL
BUN: 30 mg/dL — ABNORMAL HIGH (ref 6–23)
CALCIUM: 9.9 mg/dL (ref 8.4–10.5)
CO2: 24 mEq/L (ref 19–32)
Chloride: 105 mEq/L (ref 96–112)
Creatinine, Ser: 1.4 mg/dL — ABNORMAL HIGH (ref 0.4–1.2)
GFR: 38.93 mL/min — ABNORMAL LOW (ref >60.00)
Glucose, Bld: 84 mg/dL (ref 70–99)
Potassium: 4.3 mEq/L (ref 3.5–5.1)
SODIUM: 138 meq/L (ref 135–145)

## 2014-10-19 MED ORDER — LEVOFLOXACIN 500 MG PO TABS: 500.0000 mg | ORAL_TABLET | Freq: Every day | ORAL | Status: DC

## 2014-10-19 MED ORDER — OLMESARTAN MEDOXOMIL-HCTZ 40-25 MG PO TABS: 0.5000 | ORAL_TABLET | Freq: Every day | ORAL | Status: DC

## 2014-10-19 NOTE — Progress Notes (Signed)
Pre visit review using our clinic review tool, if applicable. No additional management support is needed unless otherwise documented below in the visit note.  Her brother died and then next day she had her CVA.  PFO noted on TEE. Loop recorder ongoing.  Inc ASA to 325mg  a day now.    She had prev had her R shoulder surgery and that has helped some with her pain. She has enough oxycodone for now., ie will not run out soon.   Some help from oxycodone for the pain.  Weather changes made the pain worse.    She likely had PNA in the hospital, d/w pt.  Prev with atelectasis and elevated WBC.  Done with abx currently but still with some SOB and some purulent sputum recently.   No fevers.  She has a h/o GERD and PPI use that had helped prev.  She had tired H2 blocker in the meantime but cough hasn't been controlled.   She isn't on oxygen at home. She is still using symbicort.  D/w husband about changing to prilosec from H2 blocker if needed.   PMH and SH reviewed  ROS: See HPI, otherwise noncontributory.  Meds, vitals, and allergies reviewed.   GEN: nad, alert and oriented HEENT: mucous membranes moist, tm w/o erythema, nasal exam w/o erythema, clear discharge noted,  OP with cobblestoning NECK: supple w/o LA CV: rrr.   PULM: ctab except for faint rales that bases B, no inc wob, no wheeze EXT: no edema SKIN: no acute rash No numbness, no facial droop.  Gait symmetric.

## 2014-10-19 NOTE — Patient Instructions (Signed)
Go to the lab on the way out.  We'll contact you with your lab and xray report. Start levaquin today, stop the citalopram and doxepin while on levaquin.  Use the inhaler twice a day- two puffs in AM and two puffs in PM. Update me in a few day.  If more SOB or any new neurologic changes, then to ER.  Take care.

## 2014-10-20 ENCOUNTER — Other Ambulatory Visit: Payer: Self-pay

## 2014-10-20 ENCOUNTER — Other Ambulatory Visit: Payer: Self-pay | Admitting: Family Medicine

## 2014-10-20 DIAGNOSIS — J3081 Allergic rhinitis due to animal (cat) (dog) hair and dander: Secondary | ICD-10-CM | POA: Diagnosis not present

## 2014-10-20 DIAGNOSIS — J3089 Other allergic rhinitis: Secondary | ICD-10-CM | POA: Diagnosis not present

## 2014-10-20 NOTE — Telephone Encounter (Signed)
Electronic refill request.  Relafen  Last filled 09/16/14 with 2 RF ? quantity Alprazolam Last Filled:   ? quantity 5 RF on 09/06/2014  Promethazine not on current meds list.  Please advise.

## 2014-10-20 NOTE — Assessment & Plan Note (Signed)
With loop recorder ongoing, on higher dose ASA with PFO noted on TEE.  D/w pt and husband.  Continue as is for now.  No residual neuro deficits, thankfully.

## 2014-10-20 NOTE — Assessment & Plan Note (Signed)
Continue prn opiate, per husband.  They are trying to taper her down as much as tolerated/possible.

## 2014-10-20 NOTE — Assessment & Plan Note (Signed)
With her exam and WBC, would presume.  See notes on labs and xray. If worsening resp status, to ER.  D/w pt and husband.  >25 minutes spent in face to face time with patient, >50% spent in counselling or coordination of care.

## 2014-10-21 ENCOUNTER — Telehealth: Payer: Self-pay

## 2014-10-21 MED ORDER — FLUCONAZOLE 150 MG PO TABS: 150.0000 mg | ORAL_TABLET | Freq: Once | ORAL | Status: DC

## 2014-10-21 NOTE — Telephone Encounter (Signed)
Diflucan sent.  Thanks.

## 2014-10-21 NOTE — Telephone Encounter (Signed)
Mr Aimee Jones said pt has been taking abx and now has yeast infection, vaginal and perineal itching but no vaginal discharge noted. Pt has tried monistat OTC with no relief. [facility] [address]Please advise.

## 2014-10-21 NOTE — Telephone Encounter (Signed)
Patient's husband notified that script has been sent to the pharmacy.

## 2014-10-21 NOTE — Telephone Encounter (Signed)
Spoke to [employer] at the pharmacy and was advised that last fill on Alprazolam #90 09/06/14, Relafen 07/18/14 #60 and Promethazine #20, 02/04/14.

## 2014-10-21 NOTE — Telephone Encounter (Signed)
Please verify the last fill dates on each med at the pharmacy. The EMR entries look like historical med updates.  Let me know. Thanks.

## 2014-10-21 NOTE — Telephone Encounter (Signed)
Please verify the last fill dates at the pharmacy.  The EMR entries look like historical med updates.

## 2014-10-21 NOTE — Telephone Encounter (Signed)
Received refill request electronically from pharmacy. Last office visit 10/19/14.  Last refill Alprazolam 09/06/14 with 5 refills, Phenergan is no on medication list. Is it okay to refill medications?

## 2014-10-22 NOTE — Telephone Encounter (Signed)
Medication phoned to pharmacy.  

## 2014-10-22 NOTE — Telephone Encounter (Signed)
Please call in xanax. Others sent.  Thanks.

## 2014-10-23 ENCOUNTER — Emergency Department (HOSPITAL_COMMUNITY)
Admission: EM | Admit: 2014-10-23 | Discharge: 2014-10-23 | Disposition: A | Discharge status: Home / Self Care | Payer: Medicare Other | Attending: Emergency Medicine | Admitting: Emergency Medicine

## 2014-10-23 ENCOUNTER — Emergency Department (HOSPITAL_COMMUNITY): Payer: Medicare Other

## 2014-10-23 ENCOUNTER — Encounter (HOSPITAL_COMMUNITY): Payer: Self-pay

## 2014-10-23 ENCOUNTER — Telehealth: Payer: Self-pay | Admitting: Cardiology

## 2014-10-23 DIAGNOSIS — Z79899 Other long term (current) drug therapy: Secondary | ICD-10-CM | POA: Diagnosis not present

## 2014-10-23 DIAGNOSIS — R29810 Facial weakness: Secondary | ICD-10-CM | POA: Diagnosis not present

## 2014-10-23 DIAGNOSIS — F410 Panic disorder [episodic paroxysmal anxiety] without agoraphobia: Secondary | ICD-10-CM | POA: Insufficient documentation

## 2014-10-23 DIAGNOSIS — Z7951 Long term (current) use of inhaled steroids: Secondary | ICD-10-CM | POA: Insufficient documentation

## 2014-10-23 DIAGNOSIS — I6789 Other cerebrovascular disease: Secondary | ICD-10-CM | POA: Diagnosis not present

## 2014-10-23 DIAGNOSIS — Z91040 Latex allergy status: Secondary | ICD-10-CM | POA: Insufficient documentation

## 2014-10-23 DIAGNOSIS — Z7982 Long term (current) use of aspirin: Secondary | ICD-10-CM | POA: Insufficient documentation

## 2014-10-23 DIAGNOSIS — F0390 Unspecified dementia without behavioral disturbance: Secondary | ICD-10-CM | POA: Insufficient documentation

## 2014-10-23 DIAGNOSIS — M169 Osteoarthritis of hip, unspecified: Secondary | ICD-10-CM | POA: Insufficient documentation

## 2014-10-23 DIAGNOSIS — Z792 Long term (current) use of antibiotics: Secondary | ICD-10-CM | POA: Diagnosis not present

## 2014-10-23 DIAGNOSIS — Z880 Allergy status to penicillin: Secondary | ICD-10-CM | POA: Diagnosis not present

## 2014-10-23 DIAGNOSIS — I10 Essential (primary) hypertension: Secondary | ICD-10-CM | POA: Insufficient documentation

## 2014-10-23 DIAGNOSIS — J45909 Unspecified asthma, uncomplicated: Secondary | ICD-10-CM | POA: Diagnosis not present

## 2014-10-23 DIAGNOSIS — R200 Anesthesia of skin: Secondary | ICD-10-CM | POA: Insufficient documentation

## 2014-10-23 DIAGNOSIS — R2990 Unspecified symptoms and signs involving the nervous system: Secondary | ICD-10-CM

## 2014-10-23 DIAGNOSIS — R4781 Slurred speech: Secondary | ICD-10-CM | POA: Diagnosis not present

## 2014-10-23 DIAGNOSIS — F329 Major depressive disorder, single episode, unspecified: Secondary | ICD-10-CM | POA: Diagnosis not present

## 2014-10-23 DIAGNOSIS — E785 Hyperlipidemia, unspecified: Secondary | ICD-10-CM | POA: Diagnosis not present

## 2014-10-23 DIAGNOSIS — K219 Gastro-esophageal reflux disease without esophagitis: Secondary | ICD-10-CM | POA: Diagnosis not present

## 2014-10-23 DIAGNOSIS — Z8673 Personal history of transient ischemic attack (TIA), and cerebral infarction without residual deficits: Secondary | ICD-10-CM | POA: Insufficient documentation

## 2014-10-23 LAB — PROTIME-INR
INR: 1.07 (ref 0.00–1.49)
Prothrombin Time: 14 seconds (ref 11.6–15.2)

## 2014-10-23 LAB — DIFFERENTIAL
BASOS ABS: 0.1 10*3/uL (ref 0.0–0.1)
Basophils Relative: 1 % (ref 0–1)
Eosinophils Absolute: 0.6 10*3/uL (ref 0.0–0.7)
Eosinophils Relative: 5 % (ref 0–5)
LYMPHS PCT: 23 % (ref 12–46)
Lymphs Abs: 3.1 10*3/uL (ref 0.7–4.0)
Monocytes Absolute: 1 10*3/uL (ref 0.1–1.0)
Monocytes Relative: 7 % (ref 3–12)
NEUTROS PCT: 64 % (ref 43–77)
Neutro Abs: 8.6 10*3/uL — ABNORMAL HIGH (ref 1.7–7.7)

## 2014-10-23 LAB — APTT: aPTT: 26 seconds (ref 24–37)

## 2014-10-23 LAB — RAPID URINE DRUG SCREEN, HOSP PERFORMED
AMPHETAMINES: NOT DETECTED
BARBITURATES: NOT DETECTED
Benzodiazepines: POSITIVE — AB
COCAINE: NOT DETECTED
OPIATES: POSITIVE — AB
TETRAHYDROCANNABINOL: NOT DETECTED

## 2014-10-23 LAB — CBC
HCT: 38.8 % (ref 36.0–46.0)
HEMOGLOBIN: 12.4 g/dL (ref 12.0–15.0)
MCH: 25.1 pg — AB (ref 26.0–34.0)
MCHC: 32 g/dL (ref 30.0–36.0)
MCV: 78.5 fL (ref 78.0–100.0)
Platelets: 563 10*3/uL — ABNORMAL HIGH (ref 150–400)
RBC: 4.94 MIL/uL (ref 3.87–5.11)
RDW: 16.7 % — ABNORMAL HIGH (ref 11.5–15.5)
WBC: 13.4 10*3/uL — ABNORMAL HIGH (ref 4.0–10.5)

## 2014-10-23 LAB — URINALYSIS, ROUTINE W REFLEX MICROSCOPIC
Bilirubin Urine: NEGATIVE
Glucose, UA: NEGATIVE mg/dL
HGB URINE DIPSTICK: NEGATIVE
Ketones, ur: NEGATIVE mg/dL
LEUKOCYTES UA: NEGATIVE
NITRITE: NEGATIVE
Protein, ur: NEGATIVE mg/dL
SPECIFIC GRAVITY, URINE: 1.023 (ref 1.005–1.030)
Urobilinogen, UA: 0.2 mg/dL (ref 0.0–1.0)
pH: 5.5 (ref 5.0–8.0)

## 2014-10-23 LAB — COMPREHENSIVE METABOLIC PANEL
ALT: 19 U/L (ref 0–35)
AST: 25 U/L (ref 0–37)
Albumin: 3.6 g/dL (ref 3.5–5.2)
Alkaline Phosphatase: 55 U/L (ref 39–117)
Anion gap: 11 (ref 5–15)
BILIRUBIN TOTAL: 0.7 mg/dL (ref 0.3–1.2)
BUN: 23 mg/dL (ref 6–23)
CHLORIDE: 102 meq/L (ref 96–112)
CO2: 19 mmol/L (ref 19–32)
CREATININE: 1.35 mg/dL — AB (ref 0.50–1.10)
Calcium: 9.8 mg/dL (ref 8.4–10.5)
GFR calc Af Amer: 45 mL/min — ABNORMAL LOW (ref >90)
GFR calc non Af Amer: 39 mL/min — ABNORMAL LOW (ref >90)
Glucose, Bld: 103 mg/dL — ABNORMAL HIGH (ref 70–99)
POTASSIUM: 4.2 mmol/L (ref 3.5–5.1)
Sodium: 132 mmol/L — ABNORMAL LOW (ref 135–145)
TOTAL PROTEIN: 6.7 g/dL (ref 6.0–8.3)

## 2014-10-23 LAB — I-STAT TROPONIN, ED: TROPONIN I, POC: 0 ng/mL (ref 0.00–0.08)

## 2014-10-23 LAB — ETHANOL: Alcohol, Ethyl (B): <5 mg/dL (ref 0–9)

## 2014-10-23 NOTE — ED Notes (Signed)
Pt transported to MRI 

## 2014-10-23 NOTE — Telephone Encounter (Signed)
Informed [place] at [facility] that we were receiving transmission from pt loop recorded.

## 2014-10-23 NOTE — ED Provider Notes (Signed)
CSN: [id number]     Arrival date & time 10/23/14  1150 History   First MD Initiated Contact with Patient 10/23/14 1205     Chief Complaint  Patient presents with  . Stroke Symptoms     (Consider location/radiation/quality/duration/timing/severity/associated sxs/prior Treatment) The history is provided by the patient and medical records.    This is a 69 y.o.  F  With PMH significant for HTN, HLP, GERD, depression, asthma, anxiety, recent stroke on 10/12/14, presenting to the ED for right facial droop, slurred speech, and lip numbness upon waking this morning.  Last known well was 2200 last night.  Husband called EMS immediately.  Patient reports similar symptoms with recent stroke.  On arrival to ED, patient states majority of symptoms have resolved but the right side of her hips and right side of her tongue still feel somewhat "numb".  States on the way to the ER she had some brief numbness of her right hand which quickly resolved.  Denies chest pain, SOB, dizziness, lightheadedness, visual disturbance, or ataxia.  Patient on daily ASA.  Scheduled to FU with OP neurologist next month and cardiology tomorrow (Dr. Lovena Le).  Patient has internal loop recorder in place.  VS stable on arrival.    Past Medical History  Diagnosis Date  . Hyperlipidemia   . ETOH abuse   . Allergy   . Diverticulosis   . Hypertension   . Depression   . Asthma   . GERD (gastroesophageal reflux disease)   . Normal 24 hour ambulatory pH monitoring study     good acid suppression  . Dementia   . Shortness of breath   . Anxiety     panic attacks- on occas.   . Arthritis     osteoporosis - especially hips.   . Stroke    Past Surgical History  Procedure Laterality Date  . Dobutamine stress echo  07/09/2001    normal  . Esophagogastroduodenoscopy  06/11/2006    normal  . Abdominal hysterectomy    . Total hip arthroplasty      left 1991/right 2008  . Nasal sinus surgery  1990  . Eye surgery      cataracts  removed, IOL- both eyes   . Breast surgery      3x- biposies   . Shoulder arthroscopy Left   . Reverse shoulder arthroplasty Right 08/13/2014    Procedure: REVERSE SHOULDER ARTHROPLASTY;  Surgeon: Marin Shutter, MD;  Location: [facility];  Service: Orthopedics;  Laterality: Right;  interscalene block  . Joint replacement      bilateral hips  . Loop recorder implant N/A 10/14/2014    Procedure: LOOP RECORDER IMPLANT;  Surgeon: Evans Lance, MD;  Location: [facility] CATH LAB;  Service: Cardiovascular;  Laterality: N/A;  . Tee without cardioversion N/A 10/14/2014    Procedure: TRANSESOPHAGEAL ECHOCARDIOGRAM (TEE);  Surgeon: Sueanne Margarita, MD;  Location: [facility] ENDOSCOPY;  Service: Cardiovascular;  Laterality: N/A;   Family History  Problem Relation Age of Onset  . Sudden death Brother   . Heart attack Brother   . Hypertension Brother   . Esophageal cancer Brother   . Heart attack Father   . Heart block Mother   . Stroke Brother   . Stroke Mother    History  Substance Use Topics  . Smoking status: Never Smoker   . Smokeless tobacco: Never Used  . Alcohol Use: Yes     Comment: grand Mariener - almost daily- 1/2 shot   OB History  No data available     Review of Systems  Neurological: Positive for facial asymmetry and numbness.  All other systems reviewed and are negative.     Allergies  Latex and Penicillins  Home Medications   Prior to Admission medications   Medication Sig Start Date End Date Taking? Authorizing Provider  ALPRAZolam Duanne Moron) 0.5 MG tablet TAKE 1 TABLET BY MOUTH 3 TIMES A DAY AS NEEDED 10/22/14   Tonia Ghent, MD  aspirin EC 325 MG EC tablet Take 1 tablet (325 mg total) by mouth daily. 10/14/14   Maryann Mikhail, DO  atorvastatin (LIPITOR) 10 MG tablet Take 10 mg by mouth daily at 6 PM.     Historical Provider, MD  B Complex-C (SUPER B COMPLEX PO) Take 150 mg by mouth daily.     Historical Provider, MD  budesonide-formoterol (SYMBICORT) 160-4.5 MCG/ACT inhaler  Inhale 2 puffs into the lungs 2 (two) times daily as needed (shortness of breath). 08/12/14   Tonia Ghent, MD  citalopram (CELEXA) 40 MG tablet Take 40 mg by mouth at bedtime.     Historical Provider, MD  donepezil (ARICEPT) 10 MG tablet Take 10 mg by mouth daily before breakfast.     Historical Provider, MD  doxepin (SINEQUAN) 25 MG capsule TAKE 1 CAPSULE BY MOUTH DAILY AT BEDTIME 09/16/14   Tonia Ghent, MD  fexofenadine (ALLEGRA) 180 MG tablet Take 180 mg by mouth daily before breakfast.     Historical Provider, MD  fish oil-omega-3 fatty acids 1000 MG capsule Take 1 g by mouth daily.     Historical Provider, MD  fluconazole (DIFLUCAN) 150 MG tablet Take 1 tablet (150 mg total) by mouth once. 10/21/14   Tonia Ghent, MD  fluticasone (FLONASE) 50 MCG/ACT nasal spray Place 2 sprays into both nostrils daily.    Historical Provider, MD  guaiFENesin (MUCINEX) 600 MG 12 hr tablet Take 2 tablets (1,200 mg total) by mouth 2 (two) times daily. 10/14/14   Maryann Mikhail, DO  levofloxacin (LEVAQUIN) 500 MG tablet Take 1 tablet (500 mg total) by mouth daily. Stop doxepin and citalopram while on levaquin. 10/19/14   Tonia Ghent, MD  montelukast (SINGULAIR) 10 MG tablet Take 10 mg by mouth at bedtime.  02/21/13   Historical Provider, MD  Multiple Vitamins-Minerals (MULTIVITAMIN PO) Take 1 tablet by mouth daily.    Historical Provider, MD  nabumetone (RELAFEN) 750 MG tablet TAKE 1 TABLET BY MOUTH 2 TIMES DAILY AS NEEDED FOR PAIN. 10/22/14   Tonia Ghent, MD  olmesartan-hydrochlorothiazide (BENICAR HCT) 40-25 MG per tablet Take 0.5 tablets by mouth at bedtime. 10/19/14   Tonia Ghent, MD  omeprazole (PRILOSEC) 20 MG capsule Take 20 mg by mouth daily.    Historical Provider, MD  oxyCODONE-acetaminophen (PERCOCET) 5-325 MG per tablet Take 1-2 tablets by mouth every 4 (four) hours as needed. 09/27/14   Tonia Ghent, MD  promethazine (PHENERGAN) 25 MG tablet TAKE 1 TABLET BY MOUTH EVERY 6 HOURS AS NEEDED  FOR NAUSEA 10/22/14   Tonia Ghent, MD  thiamine (VITAMIN B-1) 100 MG tablet Take 100 mg by mouth daily.    Historical Provider, MD  VITAMIN E PO Take 1 capsule by mouth daily.     Historical Provider, MD   BP 114/73 mmHg  Pulse 79  Temp(Src) 98 F (36.7 C) (Oral)  Resp 15  SpO2 95%   Physical Exam  Constitutional: She is oriented to person, place, and time. She appears well-developed  and well-nourished. No distress.  HENT:  Head: Normocephalic and atraumatic.  Mouth/Throat: Oropharynx is clear and moist.  Eyes: Conjunctivae and EOM are normal. Pupils are equal, round, and reactive to light.  Neck: Normal range of motion. Neck supple.  Cardiovascular: Normal rate, regular rhythm and normal heart sounds.   Pulmonary/Chest: Effort normal and breath sounds normal. No respiratory distress. She has no wheezes.  Abdominal: Soft. Bowel sounds are normal. There is no tenderness. There is no guarding.  Musculoskeletal: Normal range of motion.  Neurological: She is alert and oriented to person, place, and time.  AAOx3, answering questions appropriately; equal strength UE and LE bilaterally; moves all extremities appropriately without ataxia; normal finger to nose bilaterally, no pronator drift; normal sensation to light touch throughout; no focal neuro deficits or facial asymmetry appreciated  Skin: Skin is warm and dry. She is not diaphoretic.  Psychiatric: She has a normal mood and affect.  Nursing note and vitals reviewed.   ED Course  Procedures (including critical care time) Labs Review Labs Reviewed  CBC - Abnormal; Notable for the following:    WBC 13.4 (*)    MCH 25.1 (*)    RDW 16.7 (*)    Platelets 563 (*)    All other components within normal limits  DIFFERENTIAL - Abnormal; Notable for the following:    Neutro Abs 8.6 (*)    All other components within normal limits  COMPREHENSIVE METABOLIC PANEL - Abnormal; Notable for the following:    Sodium 132 (*)    Glucose, Bld  103 (*)    Creatinine, Ser 1.35 (*)    GFR calc non Af Amer 39 (*)    GFR calc Af Amer 45 (*)    All other components within normal limits  ETHANOL  PROTIME-INR  APTT  URINALYSIS, ROUTINE W REFLEX MICROSCOPIC  URINE RAPID DRUG SCREEN (HOSP PERFORMED)  I-STAT TROPOININ, ED    Imaging Review Ct Head Wo Contrast  10/23/2014   CLINICAL DATA:  Right Facial droop and slurred speech  EXAM: CT HEAD WITHOUT CONTRAST  TECHNIQUE: Contiguous axial images were obtained from the base of the skull through the vertex without intravenous contrast.  COMPARISON:  10/12/2014  FINDINGS: The bony calvarium is intact. No gross soft tissue abnormality is noted. The paranasal sinuses and mastoid air cells are well aerated. Mild atrophic changes are again identified as are chronic white matter ischemic changes. No findings to suggest acute hemorrhage, acute infarction or space-occupying mass lesion are noted.  IMPRESSION: Chronic atrophic and ischemic changes similar to that seen on the prior exam. No acute abnormality is noted.   Electronically Signed   By: Inez Catalina M.D.   On: 10/23/2014 13:09     EKG Interpretation   Date/Time:  Friday October 23 2014 11:54:45 EST Ventricular Rate:  73 PR Interval:  164 QRS Duration: 90 QT Interval:  399 QTC Calculation: 440 R Axis:   -45 Text Interpretation:  Sinus rhythm Inferior infarct, old Anterior infarct,  old No significant change since last tracing Confirmed by Winfred Leeds  MD,  [facility] [phone]) on 10/23/2014 12:02:06 PM      MDM   Final diagnoses:  Stroke-like symptoms   69 y.o. F last known well 2200 last night.  Awoke this morning with right facial droop, facial/lip/tongue numbness, and slurred speech.  Recent stroke 10/12/14 with similar symptoms.  By the time of arrival in ED, majority of symptoms have resolved-- states still has a "numb" feeling in right side of  her lips and tongue.  No focal deficits noted on exam.  Code stroke not activated due to delayed  presentation and improvement of symptoms PTA.  Will proceed with work-up.  EKG sinus rhythm without ischemic change. Labwork reassuring. CT head negative for acute findings. Case discussed with neurology, Dr. Janann Colonel-- recommends MRI now.  If negative for acute changes, may be discharged home with her scheduled OP neurology FU.  If acute changes, will need admission.  4:14 PM MRI pending.  Care signed out to [place].  Will follow MRI results and dispo accordingly.  Larene Pickett, PA-C 10/23/14 1615  Orlie Dakin, MD 10/23/14 [phone]

## 2014-10-23 NOTE — ED Provider Notes (Signed)
Level V caveat, dementia history is obtained from patient's husband. Patient awakened this morning with slurred speech, tongue deviated to right and drooping of the right side of her face. All symptoms have resolved and patient looks at baseline to her husband presently. On exam patient is alert Glasgow Coma Score 15 neurologic exam cranial nerves II through XII grossly intact. Motor strength 5 over 5 overall gait is normal. Follows all commands  Orlie Dakin, MD 10/23/14 1514

## 2014-10-23 NOTE — ED Notes (Signed)
Paged med-tronic to [place]

## 2014-10-23 NOTE — ED Notes (Signed)
Paged med-tronic to [place]

## 2014-10-23 NOTE — ED Provider Notes (Signed)
Patient was signed out to me for a stroke workup including a brain MRI. She had a recent stroke with a full workup. She is sent with strokelike symptoms which has resolved. Her brain MRI shows no evidence of acute stroke. The results were discussed with patient and husband who was at bedside. Patient agrees to follow-up closely with her cardiologist and her neurologist for further management. Return precautions discussed. All questions answered to patient's satisfaction.  BP 111/73 mmHg  Pulse 75  Temp(Src) 98 F (36.7 C) (Oral)  Resp 20  SpO2 98%  I have reviewed nursing notes and vital signs. I personally reviewed the imaging tests through PACS system  I reviewed available ER/hospitalization records thought the EMR  Results for orders placed or performed during the hospital encounter of 10/23/14  Ethanol  Result Value Ref Range   Alcohol, Ethyl (B) <5 0 - 9 mg/dL  Protime-INR  Result Value Ref Range   Prothrombin Time 14.0 11.6 - 15.2 seconds   INR 1.07 0.00 - 1.49  APTT  Result Value Ref Range   aPTT 26 24 - 37 seconds  CBC  Result Value Ref Range   WBC 13.4 (H) 4.0 - 10.5 K/uL   RBC 4.94 3.87 - 5.11 MIL/uL   Hemoglobin 12.4 12.0 - 15.0 g/dL   HCT 38.8 36.0 - 46.0 %   MCV 78.5 78.0 - 100.0 fL   MCH 25.1 (L) 26.0 - 34.0 pg   MCHC 32.0 30.0 - 36.0 g/dL   RDW 16.7 (H) 11.5 - 15.5 %   Platelets 563 (H) 150 - 400 K/uL  Differential  Result Value Ref Range   Neutrophils Relative % 64 43 - 77 %   Neutro Abs 8.6 (H) 1.7 - 7.7 K/uL   Lymphocytes Relative 23 12 - 46 %   Lymphs Abs 3.1 0.7 - 4.0 K/uL   Monocytes Relative 7 3 - 12 %   Monocytes Absolute 1.0 0.1 - 1.0 K/uL   Eosinophils Relative 5 0 - 5 %   Eosinophils Absolute 0.6 0.0 - 0.7 K/uL   Basophils Relative 1 0 - 1 %   Basophils Absolute 0.1 0.0 - 0.1 K/uL  Comprehensive metabolic panel  Result Value Ref Range   Sodium 132 (L) 135 - 145 mmol/L   Potassium 4.2 3.5 - 5.1 mmol/L   Chloride 102 96 - 112 mEq/L   CO2 19  19 - 32 mmol/L   Glucose, Bld 103 (H) 70 - 99 mg/dL   BUN 23 6 - 23 mg/dL   Creatinine, Ser 1.35 (H) 0.50 - 1.10 mg/dL   Calcium 9.8 8.4 - 10.5 mg/dL   Total Protein 6.7 6.0 - 8.3 g/dL   Albumin 3.6 3.5 - 5.2 g/dL   AST 25 0 - 37 U/L   ALT 19 0 - 35 U/L   Alkaline Phosphatase 55 39 - 117 U/L   Total Bilirubin 0.7 0.3 - 1.2 mg/dL   GFR calc non Af Amer 39 (L) >90 mL/min   GFR calc Af Amer 45 (L) >90 mL/min   Anion gap 11 5 - 15  Urine Drug Screen  Result Value Ref Range   Opiates POSITIVE (A) NONE DETECTED   Cocaine NONE DETECTED NONE DETECTED   Benzodiazepines POSITIVE (A) NONE DETECTED   Amphetamines NONE DETECTED NONE DETECTED   Tetrahydrocannabinol NONE DETECTED NONE DETECTED   Barbiturates NONE DETECTED NONE DETECTED  Urinalysis, Routine w reflex microscopic  Result Value Ref Range   Color, Urine YELLOW YELLOW  APPearance CLEAR CLEAR   Specific Gravity, Urine 1.023 1.005 - 1.030   pH 5.5 5.0 - 8.0   Glucose, UA NEGATIVE NEGATIVE mg/dL   Hgb urine dipstick NEGATIVE NEGATIVE   Bilirubin Urine NEGATIVE NEGATIVE   Ketones, ur NEGATIVE NEGATIVE mg/dL   Protein, ur NEGATIVE NEGATIVE mg/dL   Urobilinogen, UA 0.2 0.0 - 1.0 mg/dL   Nitrite NEGATIVE NEGATIVE   Leukocytes, UA NEGATIVE NEGATIVE  I-stat troponin, ED (not at [facility])  Result Value Ref Range   Troponin i, poc 0.00 0.00 - 0.08 ng/mL   Comment 3           Dg Chest 2 View  10/19/2014   CLINICAL DATA:  Community-acquired pneumonia  EXAM: CHEST  2 VIEW  COMPARISON:  PA and lateral chest of October 12, 2014, October 19, 2009, and April 17, 2009 P  FINDINGS: The lungs are adequately inflated. There is no focal infiltrate. There is hazy density associated with the cardiac apex which is little changed since December 28th but is new since the previous studies. The heart and pulmonary vascularity are normal. The trachea is midline. An implanted cardiac monitoring device is present. There is a prosthetic right shoulder joint. The bony  thorax exhibits no acute abnormality.  IMPRESSION: There is no evidence of pneumonia pneumonia. There is subsegmental atelectasis in the lingula little changed since October 12, 2014.   Electronically Signed   By: David  Martinique   On: 10/19/2014 11:34   Dg Chest 2 View  10/12/2014   CLINICAL DATA:  Cough and difficulty breathing  EXAM: CHEST  2 VIEW  COMPARISON:  October 19, 2009  FINDINGS: There is atelectasis in the left base. Elsewhere lungs are clear. Heart size and pulmonary vascularity are normal. No adenopathy. 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  IMPRESSION: Left base atelectasis.  Elsewhere lungs clear.   Electronically Signed   By: Lowella Grip M.D.   On: 10/12/2014 21:31   Ct Head Wo Contrast  10/23/2014   CLINICAL DATA:  Right Facial droop and slurred speech  EXAM: CT HEAD WITHOUT CONTRAST  TECHNIQUE: Contiguous axial images were obtained from the base of the skull through the vertex without intravenous contrast.  COMPARISON:  10/12/2014  FINDINGS: The bony calvarium is intact. No gross soft tissue abnormality is noted. The paranasal sinuses and mastoid air cells are well aerated. Mild atrophic changes are again identified as are chronic white matter ischemic changes. No findings to suggest acute hemorrhage, acute infarction or space-occupying mass lesion are noted.  IMPRESSION: Chronic atrophic and ischemic changes similar to that seen on the prior exam. No acute abnormality is noted.   Electronically Signed   By: Inez Catalina M.D.   On: 10/23/2014 13:09   Ct Head Wo Contrast  10/12/2014   CLINICAL DATA:  Code stroke.  Dizziness.  Right facial droop.  EXAM: CT HEAD WITHOUT CONTRAST  TECHNIQUE: Contiguous axial images were obtained from the base of the skull through the vertex without intravenous contrast.  COMPARISON:  Brain MRI 05/29/2014  FINDINGS: Atrophy and chronic small vessel ischemic change, stable from prior. No intracranial hemorrhage, mass effect, or midline  shift. No hydrocephalus. The basilar cisterns are patent. No evidence of territorial infarct. No intracranial fluid collection. Calvarium is intact. Included paranasal sinuses and mastoid air cells are well aerated.  IMPRESSION: Stable atrophy and chronic small vessel ischemic change. No evidence of acute intracranial abnormality.  These results were called by telephone at the time  of interpretation on 10/12/2014 at 11:53 am to Dr. Dr Armida Sans , who verbally acknowledged these results.   Electronically Signed   By: Jeb Levering M.D.   On: 10/12/2014 11:55   Mr Jodene Nam Head Wo Contrast  10/12/2014   CLINICAL DATA:  Stroke.  Hypertension and hyperlipidemia  EXAM: MRI HEAD WITHOUT CONTRAST  MRA HEAD WITHOUT CONTRAST  TECHNIQUE: Multiplanar, multiecho pulse sequences of the brain and surrounding structures were obtained without intravenous contrast. Angiographic images of the head were obtained using MRA technique without contrast.  COMPARISON:  CT head 10/12/2014  FINDINGS: MRI HEAD FINDINGS  Small areas of acute infarct in the left occipital cortex. Probable small area of acute infarct in the right occipital cortex. No other areas of acute infarct.  Generalized atrophy. Moderate chronic microvascular ischemic changes similar to the prior MRI.  Negative for hemorrhage  Negative for mass or edema.  No shift of the midline structures  Minimal mucosal edema in the paranasal sinuses.  MRA HEAD FINDINGS  Both vertebral arteries widely patent. PICA patent bilaterally. Superior cerebellar, and posterior cerebral arteries widely patent. Basilar widely patent.  Internal carotid artery widely patent bilaterally. Anterior and middle cerebral arteries widely patent bilaterally.  Negative for cerebral aneurysm.  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  Atrophy and chronic microvascular ischemic changes are stable from the prior MRI  Negative MRA head.   Electronically  Signed   By: Franchot Gallo M.D.   On: 10/12/2014 14:49   Mr Brain Wo Contrast  10/23/2014   CLINICAL DATA:  Stroke.  Slurred speech.  EXAM: MRI HEAD WITHOUT CONTRAST  TECHNIQUE: Multiplanar, multiecho pulse sequences of the brain and surrounding structures were obtained without intravenous contrast.  COMPARISON:  CT head 10/23/2014.  MRI 10/12/2014  FINDINGS: Negative for acute infarct. Small acute infarcts on 11/12/2013 have resolved.  Moderate atrophy. Moderate chronic microvascular ischemic changes in the white matter. Small chronic infarct right cerebellum.  Negative for hemorrhage or fluid collection  Negative for mass or edema.  No shift of the midline structures  Paranasal sinuses are clear.  IMPRESSION: Atrophy and chronic microvascular ischemia.  Negative for acute infarct.   Electronically Signed   By: Franchot Gallo M.D.   On: 10/23/2014 16:41   Mr Brain Wo Contrast  10/12/2014   CLINICAL DATA:  Stroke.  Hypertension and hyperlipidemia  EXAM: MRI HEAD WITHOUT CONTRAST  MRA HEAD WITHOUT CONTRAST  TECHNIQUE: Multiplanar, multiecho pulse sequences of the brain and surrounding structures were obtained without intravenous contrast. Angiographic images of the head were obtained using MRA technique without contrast.  COMPARISON:  CT head 10/12/2014  FINDINGS: MRI HEAD FINDINGS  Small areas of acute infarct in the left occipital cortex. Probable small area of acute infarct in the right occipital cortex. No other areas of acute infarct.  Generalized atrophy. Moderate chronic microvascular ischemic changes similar to the prior MRI.  Negative for hemorrhage  Negative for mass or edema.  No shift of the midline structures  Minimal mucosal edema in the paranasal sinuses.  MRA HEAD FINDINGS  Both vertebral arteries widely patent. PICA patent bilaterally. Superior cerebellar, and posterior cerebral arteries widely patent. Basilar widely patent.  Internal carotid artery widely patent bilaterally. Anterior and  middle cerebral arteries widely patent bilaterally.  Negative for cerebral aneurysm.  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  Atrophy and chronic microvascular ischemic changes are stable from the  prior MRI  Negative MRA head.   Electronically Signed   By: Franchot Gallo M.D.   On: 10/12/2014 14:49      Domenic Moras, PA-C 10/23/14 1655  Johnna Acosta, MD 10/24/14 [phone]

## 2014-10-23 NOTE — Discharge Instructions (Signed)
Please follow up closely with your doctor for further care of your symptoms.  Return to ER if your symptoms worsen or if you have other concerns.  Transient Ischemic Attack A transient ischemic attack (TIA) is a "warning stroke" that causes stroke-like symptoms. Unlike a stroke, a TIA does not cause permanent damage to the brain. The symptoms of a TIA can happen very fast and do not last long. It is important to know the symptoms of a TIA and what to do. This can help prevent a major stroke or death. CAUSES   A TIA is caused by a temporary blockage in an artery in the brain or neck (carotid artery). The blockage does not allow the brain to get the blood supply it needs and can cause different symptoms. The blockage can be caused by either:  A blood clot.  Fatty buildup (plaque) in a neck or brain artery. RISK FACTORS  High blood pressure (hypertension).  High cholesterol.  Diabetes mellitus.  Heart disease.  The build up of plaque in the blood vessels (peripheral artery disease or atherosclerosis).  The build up of plaque in the blood vessels providing blood and oxygen to the brain (carotid artery stenosis).  An abnormal heart rhythm (atrial fibrillation).  Obesity.  Smoking.  Taking oral contraceptives (especially in combination with smoking).  Physical inactivity.  A diet high in fats, salt (sodium), and calories.  Alcohol use.  Use of illegal drugs (especially cocaine and methamphetamine).  Being female.  Being African American.  Being over the age of 55.  Family history of stroke.  Previous history of blood clots, stroke, TIA, or heart attack.  Sickle cell disease. SYMPTOMS  TIA symptoms are the same as a stroke but are temporary. These symptoms usually develop suddenly, or may be newly present upon awakening from sleep:  Sudden weakness or numbness of the face, arm, or leg, especially on one side of the body.  Sudden trouble walking or difficulty moving  arms or legs.  Sudden confusion.  Sudden personality changes.  Trouble speaking (aphasia) or understanding.  Difficulty swallowing.  Sudden trouble seeing in one or both eyes.  Double vision.  Dizziness.  Loss of balance or coordination.  Sudden severe headache with no known cause.  Trouble reading or writing.  Loss of bowel or bladder control.  Loss of consciousness. DIAGNOSIS  Your caregiver may be able to determine the presence or absence of a TIA based on your symptoms, history, and physical exam. Computed tomography (CT scan) of the brain is usually performed to help identify a TIA. Other tests may be done to diagnose a TIA. These tests may include:  Electrocardiography.  Continuous heart monitoring.  Echocardiography.  Carotid ultrasonography.  Magnetic resonance imaging (MRI).  A scan of the brain circulation.  Blood tests. PREVENTION  The risk of a TIA can be decreased by appropriately treating high blood pressure, high cholesterol, diabetes, heart disease, and obesity and by quitting smoking, limiting alcohol, and staying physically active. TREATMENT  Time is of the essence. Since the symptoms of TIA are the same as a stroke, it is important to seek treatment as soon as possible because you may need a medicine to dissolve the clot (thrombolytic) that cannot be given if too much time has passed. Treatment options vary. Treatment options may include rest, oxygen, intravenous (IV) fluids, and medicines to thin the blood (anticoagulants). Medicines and diet may be used to address diabetes, high blood pressure, and other risk factors. Measures will be taken  to prevent short-term and long-term complications, including infection from breathing foreign material into the lungs (aspiration pneumonia), blood clots in the legs, and falls. Treatment options include procedures to either remove plaque in the carotid arteries or dilate carotid arteries that have narrowed due to  plaque. Those procedures are:  Carotid endarterectomy.  Carotid angioplasty and stenting. HOME CARE INSTRUCTIONS   Take all medicines prescribed by your caregiver. Follow the directions carefully. Medicines may be used to control risk factors for a stroke. Be sure you understand all your medicine instructions.  You may be told to take aspirin or the anticoagulant warfarin. Warfarin needs to be taken exactly as instructed.  Taking too much or too little warfarin is dangerous. Too much warfarin increases the risk of bleeding. Too little warfarin continues to allow the risk for blood clots. While taking warfarin, you will need to have regular blood tests to measure your blood clotting time. A PT blood test measures how long it takes for blood to clot. Your PT is used to calculate another value called an INR. Your PT and INR help your caregiver to adjust your dose of warfarin. The dose can change for many reasons. It is critically important that you take warfarin exactly as prescribed.  Many foods, especially foods high in vitamin K can interfere with warfarin and affect the PT and INR. Foods high in vitamin K include spinach, kale, broccoli, cabbage, collard and turnip greens, brussels sprouts, peas, cauliflower, seaweed, and parsley as well as beef and pork liver, green tea, and soybean oil. You should eat a consistent amount of foods high in vitamin K. Avoid major changes in your diet, or notify your caregiver before changing your diet. Arrange a visit with a dietitian to answer your questions.  Many medicines can interfere with warfarin and affect the PT and INR. You must tell your caregiver about any and all medicines you take, this includes all vitamins and supplements. Be especially cautious with aspirin and anti-inflammatory medicines. Do not take or discontinue any prescribed or over-the-counter medicine except on the advice of your caregiver or pharmacist.  Warfarin can have side effects, such  as excessive bruising or bleeding. You will need to hold pressure over cuts for longer than usual. Your caregiver or pharmacist will discuss other potential side effects.  Avoid sports or activities that may cause injury or bleeding.  Be mindful when shaving, flossing your teeth, or handling sharp objects.  Alcohol can change the body's ability to handle warfarin. It is best to avoid alcoholic drinks or consume only very small amounts while taking warfarin. Notify your caregiver if you change your alcohol intake.  Notify your dentist or other caregivers before procedures.  Eat a diet that includes 5 or more servings of fruits and vegetables each day. This may reduce the risk of stroke. Certain diets may be prescribed to address high blood pressure, high cholesterol, diabetes, or obesity.  A low-sodium, low-saturated fat, low-trans fat, low-cholesterol diet is recommended to manage high blood pressure.  A low-saturated fat, low-trans fat, low-cholesterol, and high-fiber diet may control cholesterol levels.  A controlled-carbohydrate, controlled-sugar diet is recommended to manage diabetes.  A reduced-calorie, low-sodium, low-saturated fat, low-trans fat, low-cholesterol diet is recommended to manage obesity.  Maintain a healthy weight.  Stay physically active. It is recommended that you get at least 30 minutes of activity on most or all days.  Do not smoke.  Limit alcohol use even if you are not taking warfarin. Moderate alcohol use is  considered to be:  No more than 2 drinks each day for men.  No more than 1 drink each day for nonpregnant women.  Stop drug abuse.  Home safety. A safe home environment is important to reduce the risk of falls. Your caregiver may arrange for specialists to evaluate your home. Having grab bars in the bedroom and bathroom is often important. Your caregiver may arrange for equipment to be used at home, such as raised toilets and a seat for the  shower.  Follow all instructions for follow-up with your caregiver. This is very important. This includes any referrals and lab tests. Proper follow up can prevent a stroke or another TIA from occurring. SEEK MEDICAL CARE IF:  You have personality changes.  You have difficulty swallowing.  You are seeing double.  You have dizziness.  You have a fever.  You have skin breakdown. SEEK IMMEDIATE MEDICAL CARE IF:  Any of these symptoms may represent a serious problem that is an emergency. Do not wait to see if the symptoms will go away. Get medical help right away. Call your local emergency services (911 in [place] Do not drive yourself to the hospital.  You have sudden weakness or numbness of the face, arm, or leg, especially on one side of the body.  You have sudden trouble walking or difficulty moving arms or legs.  You have sudden confusion.  You have trouble speaking (aphasia) or understanding.  You have sudden trouble seeing in one or both eyes.  You have a loss of balance or coordination.  You have a sudden, severe headache with no known cause.  You have new chest pain or an irregular heartbeat.  You have a partial or total loss of consciousness. MAKE SURE YOU:   Understand these instructions.  Will watch your condition.  Will get help right away if you are not doing well or get worse. Document Released: 07/12/2005 Document Revised: 10/07/2013 Document Reviewed: 01/07/2014 [facility] Patient Information 2015 [place], [place]. This information is not intended to replace advice given to you by your health care provider. Make sure you discuss any questions you have with your health care provider.

## 2014-10-23 NOTE — ED Notes (Signed)
Per EMS: pt from home with c/o stroke symptoms.  Per husband she awoke withy right sided facial droop and slurred speech.  LSN last night at 10 pm.  Symptoms had resolved by the time EMS arrived.  Pt with CVA 2 weeks ago and admitted to hospital for evaluation. VSS.  CBG 101.  Pt speaking in clear sentences on arrival.

## 2014-10-25 ENCOUNTER — Other Ambulatory Visit: Payer: Self-pay | Admitting: Family Medicine

## 2014-10-26 ENCOUNTER — Encounter: Payer: Self-pay | Admitting: Internal Medicine

## 2014-10-26 ENCOUNTER — Ambulatory Visit (INDEPENDENT_AMBULATORY_CARE_PROVIDER_SITE_OTHER): Payer: Medicare Other | Admitting: *Deleted

## 2014-10-26 ENCOUNTER — Encounter: Payer: Self-pay | Admitting: Family Medicine

## 2014-10-26 DIAGNOSIS — I639 Cerebral infarction, unspecified: Secondary | ICD-10-CM

## 2014-10-26 DIAGNOSIS — Z96611 Presence of right artificial shoulder joint: Secondary | ICD-10-CM | POA: Diagnosis not present

## 2014-10-26 DIAGNOSIS — I6322 Cerebral infarction due to unspecified occlusion or stenosis of basilar arteries: Secondary | ICD-10-CM

## 2014-10-26 DIAGNOSIS — Z471 Aftercare following joint replacement surgery: Secondary | ICD-10-CM | POA: Diagnosis not present

## 2014-10-26 LAB — MDC_IDC_ENUM_SESS_TYPE_INCLINIC

## 2014-10-26 MED ORDER — OXYCODONE-ACETAMINOPHEN 5-325 MG PO TABS: 1.0000 | ORAL_TABLET | ORAL | Status: DC | PRN

## 2014-10-26 NOTE — Telephone Encounter (Signed)
MyChart refill request.  Last Filled:    60 tablet 0 Rf on 09/27/2014  Please advise.

## 2014-10-26 NOTE — Telephone Encounter (Signed)
Printed.  Thanks.  

## 2014-10-26 NOTE — Progress Notes (Signed)
Wound check-ILR.  Steri strips removed by the patient, no redness or edma noted, wound well healed.  R-waves 0.9-0.13mV.  4 pause episodes are signal drop out and 1 tachy episode is noise.  Follow up via [facility].

## 2014-10-27 DIAGNOSIS — J3089 Other allergic rhinitis: Secondary | ICD-10-CM | POA: Diagnosis not present

## 2014-10-27 DIAGNOSIS — J3081 Allergic rhinitis due to animal (cat) (dog) hair and dander: Secondary | ICD-10-CM | POA: Diagnosis not present

## 2014-10-27 NOTE — Telephone Encounter (Signed)
Husband advised.  Rx left at front desk for pick up.  

## 2014-11-10 ENCOUNTER — Other Ambulatory Visit: Payer: Self-pay

## 2014-11-10 NOTE — Telephone Encounter (Signed)
Mr Paternostro left v/m; pt is in [place] and [facility] will honor [facility]. Mr Treadwell request cb when rx ready for pick up and pts sister will pick up rx at [facility] and sister will get rx to pt. Mr Geving request cb. Pt having pain after shoulder surgery and arthritis. Last filled #60 10/26/14.Please advise.

## 2014-11-11 MED ORDER — OXYCODONE-ACETAMINOPHEN 5-325 MG PO TABS: 1.0000 | ORAL_TABLET | ORAL | Status: DC | PRN

## 2014-11-11 NOTE — Telephone Encounter (Signed)
Printed.  Thanks.  

## 2014-11-11 NOTE — Telephone Encounter (Signed)
Patient's husband notified that script is up front ready for pickup. Was advised that his sister-in-law will have to pick it up for them.

## 2014-11-13 ENCOUNTER — Ambulatory Visit (INDEPENDENT_AMBULATORY_CARE_PROVIDER_SITE_OTHER): Payer: Medicare Other | Admitting: *Deleted

## 2014-11-13 DIAGNOSIS — I639 Cerebral infarction, unspecified: Secondary | ICD-10-CM | POA: Diagnosis not present

## 2014-11-13 DIAGNOSIS — I6322 Cerebral infarction due to unspecified occlusion or stenosis of basilar arteries: Secondary | ICD-10-CM

## 2014-11-13 LAB — MDC_IDC_ENUM_SESS_TYPE_REMOTE
MDC IDC SESS DTM: 20160108203045
Zone Setting Detection Interval: 2000 ms
Zone Setting Detection Interval: 3000 ms
Zone Setting Detection Interval: 370 ms

## 2014-11-17 NOTE — Progress Notes (Signed)
Loop recorder 

## 2014-11-23 ENCOUNTER — Telehealth: Payer: Self-pay

## 2014-11-23 NOTE — Telephone Encounter (Signed)
Aimee Jones left v/m; pt is in [place] and [facility] will [facility]. Aimee Jones request cb when rx ready for pick up and pts sister will pick up rx at [facility] and sister will get rx to pt. Aimee Jones request cb. Pt having pain after shoulder surgery and arthritis. Last filled #60 11/11/14.Please advise. Pt still has 5 days of med left.

## 2014-11-24 MED ORDER — OXYCODONE-ACETAMINOPHEN 5-325 MG PO TABS: 1.0000 | ORAL_TABLET | ORAL | Status: DC | PRN

## 2014-11-24 NOTE — Telephone Encounter (Signed)
Left detailed message on voicemail of home and cell phone.  Rx left at front desk for pick up.

## 2014-11-24 NOTE — Telephone Encounter (Signed)
Printed.  How much longer will she be out of [place]?  Note fill after date, since she has several days of med left.  She's averaging about 3 pills per day. Thanks.

## 2014-11-24 NOTE — Telephone Encounter (Signed)
Mr Ende left v/m that Izora Gala pts sister will pick up rx on 11/25/14. Pt should be returning home between 11/30/14 -02/18-16 and will call then to schedule appt with Dr [name]. Mr Glasscock does not require cb.

## 2014-11-25 NOTE — Telephone Encounter (Signed)
Noted, thanks!

## 2014-12-04 DIAGNOSIS — M25512 Pain in left shoulder: Secondary | ICD-10-CM | POA: Diagnosis not present

## 2014-12-07 ENCOUNTER — Other Ambulatory Visit: Payer: Self-pay

## 2014-12-07 MED ORDER — OXYCODONE-ACETAMINOPHEN 5-325 MG PO TABS: 1.0000 | ORAL_TABLET | ORAL | Status: DC | PRN

## 2014-12-07 NOTE — Telephone Encounter (Signed)
Aimee Jones left v/m requesting rx oxycodone apap. Call when ready for pick up. Pt saw shoulder surgeon on 12/03/14 and was given injection which helped pain temporarily; pt only has 2 pills of oxycodone apap left. Pt is also taking methocarbamol for spasms. Pt is in extreme pain and request cb.

## 2014-12-07 NOTE — Telephone Encounter (Signed)
Husband and patient advised.  Husband made appt with Dr. [name] today while on the phone and will be making appt with surgery clinic also.

## 2014-12-07 NOTE — Telephone Encounter (Signed)
If she is in dramatically more pain after injection, then she needs recheck with the surgery clinic in the meantime.  I printed the rx only on the condition that she f/u with surgery asap.  She needs f/u visit scheduled here.

## 2014-12-08 DIAGNOSIS — J3081 Allergic rhinitis due to animal (cat) (dog) hair and dander: Secondary | ICD-10-CM | POA: Diagnosis not present

## 2014-12-08 DIAGNOSIS — J3089 Other allergic rhinitis: Secondary | ICD-10-CM | POA: Diagnosis not present

## 2014-12-08 DIAGNOSIS — J301 Allergic rhinitis due to pollen: Secondary | ICD-10-CM | POA: Diagnosis not present

## 2014-12-10 ENCOUNTER — Encounter: Payer: Self-pay | Admitting: Family Medicine

## 2014-12-10 ENCOUNTER — Ambulatory Visit (INDEPENDENT_AMBULATORY_CARE_PROVIDER_SITE_OTHER): Payer: Medicare Other | Admitting: Family Medicine

## 2014-12-10 VITALS — BP 112/66 | HR 80 | Temp 98.4°F | Wt 177.2 lb

## 2014-12-10 DIAGNOSIS — G4730 Sleep apnea, unspecified: Secondary | ICD-10-CM | POA: Diagnosis not present

## 2014-12-10 DIAGNOSIS — I639 Cerebral infarction, unspecified: Secondary | ICD-10-CM | POA: Diagnosis not present

## 2014-12-10 DIAGNOSIS — M25512 Pain in left shoulder: Secondary | ICD-10-CM

## 2014-12-10 DIAGNOSIS — M25511 Pain in right shoulder: Secondary | ICD-10-CM | POA: Diagnosis not present

## 2014-12-10 MED ORDER — METHOCARBAMOL 500 MG PO TABS: 500.0000 mg | ORAL_TABLET | Freq: Four times a day (QID) | ORAL | Status: DC | PRN

## 2014-12-10 NOTE — Progress Notes (Signed)
Pre visit review using our clinic review tool, if applicable. No additional management support is needed unless otherwise documented below in the visit note.  R shoulder prev replaced, L should injected last week.  Still on pain meds, no ADE on meds.  She still has some pain, but the R shoulder is much improved.  More pain on the L shoulder.  Taking 2 oxycodone in AM and then 1 later in the PM, 1 at night.  Usually 4 tabs per day, prior to injection.  She had prev gotten to a point of less pain earlier in 2016, with about 1-2 oxycodone used per day.    The L shoulder is now usually the "bad" shoulder.  She had transient relief from the injection, but she hasn't had time for the steroid component to have full effect.   They are waiting to see how much long term relief she gets from the recent injection.  Normal BMs, no sedation, no falls.  Discussed goals for taper on pain meds, as pain allows.  Husband is managing her meds/pills, locked up.   She is off exercises for her shoulders per ortho rec given her recent injection.    Would be reasonable to get a physical set up for summer 2016.  D/w pt and husband.    She has h/o snoring and possible apnea noted by husband.  D/w pt and husband about testing.  Will refer.  We are already trying to taper her pain meds.   Meds, vitals, and allergies reviewed.   ROS: See HPI.  Otherwise, noncontributory.  nad ncat Speech at baseline, not sedated Mmm Neck supple, no LA Able to get B arms up, nearly vertical but L >R shoulder pain with int/ext rotation No arm drop rrr ctab

## 2014-12-10 NOTE — Patient Instructions (Signed)
Aimee Jones will call about your referral for pulmonary- for sleep apnea testing.  Try to take the least amount of oxycodone possible, as pain allows.  Schedule a physical for the summer of this year.  Take care.  Glad to see you.

## 2014-12-11 ENCOUNTER — Ambulatory Visit (INDEPENDENT_AMBULATORY_CARE_PROVIDER_SITE_OTHER): Payer: Medicare Other | Admitting: Neurology

## 2014-12-11 ENCOUNTER — Other Ambulatory Visit: Payer: Self-pay | Admitting: Family Medicine

## 2014-12-11 ENCOUNTER — Encounter: Payer: Self-pay | Admitting: Neurology

## 2014-12-11 VITALS — BP 116/72 | HR 82 | Ht 66.0 in | Wt 178.4 lb

## 2014-12-11 DIAGNOSIS — M79601 Pain in right arm: Secondary | ICD-10-CM

## 2014-12-11 DIAGNOSIS — G20 Parkinson's disease: Secondary | ICD-10-CM | POA: Diagnosis not present

## 2014-12-11 DIAGNOSIS — I639 Cerebral infarction, unspecified: Secondary | ICD-10-CM | POA: Diagnosis not present

## 2014-12-11 DIAGNOSIS — R413 Other amnesia: Secondary | ICD-10-CM | POA: Diagnosis not present

## 2014-12-11 DIAGNOSIS — G4730 Sleep apnea, unspecified: Secondary | ICD-10-CM | POA: Insufficient documentation

## 2014-12-11 DIAGNOSIS — F1027 Alcohol dependence with alcohol-induced persisting dementia: Secondary | ICD-10-CM | POA: Diagnosis not present

## 2014-12-11 DIAGNOSIS — M79602 Pain in left arm: Secondary | ICD-10-CM | POA: Diagnosis not present

## 2014-12-11 MED ORDER — CLOPIDOGREL BISULFATE 75 MG PO TABS: 75.0000 mg | ORAL_TABLET | Freq: Every day | ORAL | Status: DC

## 2014-12-11 NOTE — Progress Notes (Signed)
STROKE NEUROLOGY FOLLOW UP NOTE  NAME: Aimee Jones DOB: 16-Jul-1946  REASON FOR VISIT: stroke follow up HISTORY FROM: husband and chart  Today we had the pleasure of seeing Aimee Jones in follow-up at our Neurology Clinic. Pt was accompanied by husband.   History Summary Aimee Jones is an 69 y.o. female with history significant for HLD, HTN, CKD, mild dementia, s/p right shoulder surgery 07/2014 was admitted for acute onset of right face droop, hands numbness, dysarthria. CT brain showed no acute abnormality. MRI showed bilateral punctate PCA/occipital infarcts, consistent with embolic pattern. Stroke workup including MRA, carotid Doppler, 2-D echo, lower extremity venous Doppler all negative. TEE was done showing small PFO, and loop recorder was placed. Aimee Jones was discharged with aspirin and Lipitor.  Interval History During the interval time, the patient has been doing stable. Aimee Jones can back to ED on 10/23/2014 for slurry speech right facial drooping and right tongue deviation. Symptoms resolved in ER. MRI did not show acute stroke. Considering TIA at that time. So far her loop recorder did not show any atrial per patient episode. Aimee Jones still has right shoulder pain on brace, and recently Aimee Jones started to have left shoulder pain, which Aimee Jones is going to undergo injections and possible MRI for a further evaluation with her orthopedic surgeon. Aimee Jones is also going to have sleep study to rule out sleep apnea ordered by her primary care doctor. Her blood pressure 116/72 in clinic today. Aimee Jones has been follow-up with Dr. Carles Collet in [place] [facility].  REVIEW OF SYSTEMS: Full 14 system review of systems performed and notable only for those listed below and in HPI above, all others are negative:  Constitutional:   Cardiovascular:  Ear/Nose/Throat:   Skin:  Eyes:   Respiratory:  Shortness breath, wheezing, snoring Gastroitestinal:   Genitourinary: Incontinence Hematology/Lymphatic:  Easy  bruising Endocrine: Feeling hot, feeling cold Musculoskeletal:  Joint pain, aching muscles Allergy/Immunology:  Allergies, runny nose Neurological:  Memory loss, confusion, headache, weakness, tremor Psychiatric:  Sleep: Insomnia, snoring  The following represents the patient's updated allergies and side effects list: Allergies  Allergen Reactions  . Latex Rash  . Penicillins Rash    The neurologically relevant items on the patient's problem list were reviewed on today's visit.  Neurologic Examination  A problem focused neurological exam (12 or more points of the single system neurologic examination, vital signs counts as 1 point, cranial nerves count for 8 points) was performed.  Blood pressure 116/72, pulse 82, height 5\' 6"  (1.676 m), weight 178 lb 6.4 oz (80.922 kg).  General - Well nourished, well developed, in no apparent distress.  Ophthalmologic - Sharp disc margins OU.  Cardiovascular - Regular rate and rhythm with no murmur.  Musculoskeletal - right arm in brace, bilateral shoulder mild tenderness on palpation  Mental Status -  Level of arousal and orientation to time, place, and person were intact. Language including expression, naming, repetition, comprehension was assessed and found intact. Attention span and concentration were partially impaired, did not spell world right but able to calculate. Recent and remote memory were 3/3 registration, 0/3 delayed recall, 3/3 with cues. Fund of Knowledge was assessed and was intact.  Cranial Nerves II - XII - II - Visual field intact OU. III, IV, VI - Extraocular movements intact. V - Facial sensation intact bilaterally. VII - Facial movement intact bilaterally. VIII - Hearing & vestibular intact bilaterally. X - Palate elevates symmetrically. XI - Chin turning & shoulder shrug intact bilaterally. XII -  Tongue protrusion intact.  Motor Strength - The patient's strength was normal in all extremities except right upper  extremity difficult to evaluate due to brace and recent surgery.  Bulk was normal and fasciculations were absent.   Motor Tone - Muscle tone was assessed at the neck and appendages and was normal.  Reflexes - The patient's reflexes were normal in all extremities and Aimee Jones had no pathological reflexes.  Sensory - Light touch, temperature/pinprick were assessed and were normal.    Coordination - The patient had normal movements in the hands and feet with no ataxia or dysmetria.  Tremor was absent.  Gait and Station - The patient's transfers, posture, gait, station, and turns were observed as normal.  Data reviewed: I personally reviewed the images and agree with the radiology interpretations.  Ct Head Wo Contrast  10/12/2014 Stable atrophy and chronic small vessel ischemic change. No evidence of acute intracranial abnormality.   Mr Brain Wo Contrast  10/12/2014 Small areas of acute cortical infarct in the occipital lobe bilaterally, probable emboli. Atrophy and chronic microvascular ischemic changes are stable from the prior MRI   Mr [facility] Contrast  10/12/2014 Negative MRA head  Dg Chest 2 View  10/12/2014 Left base atelectasis. Elsewhere lungs clear.   CUS - Bilateral: 1-39% ICA stenosis. Vertebral artery flow is antegrade.  LE venous doppler - negative for DVT  2D ehco - Left ventricle: The cavity size was normal. Wall thickness was increased in a pattern of mild LVH. Systolic function was normal. The estimated ejection fraction was in the range of 55% to 60%. Wall motion was normal; there were no regional wall motion abnormalities. Doppler parameters are consistent with abnormal left ventricular relaxation (grade 1 diastolic dysfunction). - Aortic valve: Valve area (VTI): 1.51 cm^2. Valve area (Vmax): 1.84 cm^2. Valve area (Vmean): 1.41 cm^2.  Impressions: - No cardiac source of emboli was indentified.  Loop recorder - no AF  episodes  Component     Latest Ref Rng 10/13/2014  Cholesterol     0 - 200 mg/dL 142  Triglycerides     <150 mg/dL 180 (H)  HDL     >39 mg/dL 34 (L)  Total CHOL/HDL Ratio      4.2  VLDL     0 - 40 mg/dL 36  LDL (calc)     0 - 99 mg/dL 72  Hemoglobin A1C     <5.7 % 5.7 (H)  Mean Plasma Glucose     <117 mg/dL 117 (H)   Assessment: As you may recall, Aimee Jones is a 69 y.o. Caucasian female with PMH of HLD, HTN, CKD, mild dementia, s/p right shoulder surgery 07/2014 was admitted for bilateral punctate PCA/occipital infarcts, consistent with embolic pattern. Stroke workup including MRA, carotid Doppler, 2-D echo, LE DVT all negative. TEE was done showing small PFO, and loop recorder was placed and no AF episodes found so far. Aimee Jones is on aspirin and Lipitor. Considering her bilateral shoulder pain with right arm brace for 4 months now as well as small PFO, we need to rule out upper extremity DVT. Aimee Jones is not a good candidate for RESPECT ESUS trail, due to CKD, dementia, and frequent shoulder injections. Due to recent questionable TIA episode, will change aspirin to Plavix.  Plan:  - Change aspirin to Plavix for stroke prevention - Continue Lipitor for stroke prevention - Upper extremity venous Doppler to rule out DVT - Continue loop recording - Follow up with your primary care physician for stroke  risk factor modification. Recommend maintain blood pressure goal <130/80, diabetes with hemoglobin A1c goal below 6.5% and lipids with LDL cholesterol goal below 70 mg/dL.  - Follow up with orthopedics and also Dr. Carles Collet in [place] [facility] - RTC in 3 months   Orders Placed This Encounter  Procedures  . Upper extremity Venous Duplex Bilateral    Standing Status: Future     Number of Occurrences:      Standing Expiration Date: 12/12/2015    Order Specific Question:  Laterality    Answer:  Bilateral    Order Specific Question:  Where should this test be performed:    Answer:  [facility]    Meds  ordered this encounter  Medications  . clopidogrel (PLAVIX) 75 MG tablet    Sig: Take 1 tablet (75 mg total) by mouth daily.    Dispense:  90 tablet    Refill:  3    Patient Instructions  - change ASA to plavix for stroke prevention - continue lipitor for stroke prevention - will do UE venous doppler to rule out DVT on the arms - continue to monitor loop recorder - Follow up with your primary care physician for stroke risk factor modification. Recommend maintain blood pressure goal <130/80, diabetes with hemoglobin A1c goal below 6.5% and lipids with LDL cholesterol goal below 70 mg/dL.  - check BP at home - Follow up with orthopedics regarding the shoulder pain - frequent walking during long travel to avoid DVT - continue to follow up with Dr. Carles Collet in [place] [facility] - follow up in 3 months   Rosalin Hawking, MD PhD [facility] [address] [place],  [address] [phone]

## 2014-12-11 NOTE — Assessment & Plan Note (Signed)
R shoulder improved now, awaiting to see how much relief she gets in L shoulder from injection.  Continue to try to taper oxycodone for now.  D/w pt and husband.  All in agreement.  >25 minutes spent in face to face time with patient, >50% spent in counselling or coordination of care.

## 2014-12-11 NOTE — Patient Instructions (Signed)
-   change ASA to plavix for stroke prevention - continue lipitor for stroke prevention - will do UE venous doppler to rule out DVT on the arms - continue to monitor loop recorder - Follow up with your primary care physician for stroke risk factor modification. Recommend maintain blood pressure goal <130/80, diabetes with hemoglobin A1c goal below 6.5% and lipids with LDL cholesterol goal below 70 mg/dL.  - check BP at home - Follow up with orthopedics regarding the shoulder pain - frequent walking during long travel to avoid DVT - continue to follow up with Dr. Carles Collet in [facility] - follow up in 3 months

## 2014-12-11 NOTE — Assessment & Plan Note (Signed)
Likely, refer, again stressed limiting pain meds.

## 2014-12-14 ENCOUNTER — Ambulatory Visit (INDEPENDENT_AMBULATORY_CARE_PROVIDER_SITE_OTHER): Payer: Medicare Other | Admitting: *Deleted

## 2014-12-14 DIAGNOSIS — I639 Cerebral infarction, unspecified: Secondary | ICD-10-CM

## 2014-12-14 DIAGNOSIS — I6322 Cerebral infarction due to unspecified occlusion or stenosis of basilar arteries: Secondary | ICD-10-CM

## 2014-12-15 DIAGNOSIS — M25512 Pain in left shoulder: Secondary | ICD-10-CM | POA: Diagnosis not present

## 2014-12-15 DIAGNOSIS — J3081 Allergic rhinitis due to animal (cat) (dog) hair and dander: Secondary | ICD-10-CM | POA: Diagnosis not present

## 2014-12-15 DIAGNOSIS — Z471 Aftercare following joint replacement surgery: Secondary | ICD-10-CM | POA: Diagnosis not present

## 2014-12-15 DIAGNOSIS — Z96611 Presence of right artificial shoulder joint: Secondary | ICD-10-CM | POA: Diagnosis not present

## 2014-12-15 DIAGNOSIS — J3089 Other allergic rhinitis: Secondary | ICD-10-CM | POA: Diagnosis not present

## 2014-12-16 ENCOUNTER — Other Ambulatory Visit: Payer: Self-pay

## 2014-12-16 DIAGNOSIS — M25519 Pain in unspecified shoulder: Secondary | ICD-10-CM

## 2014-12-16 NOTE — Telephone Encounter (Signed)
Aimee Jones left v/m requesting rx oxycodone apap. Call when ready for pick up. Shoulder pain continues, pt has 9 pills left. Pt averaging taking 4-5 pills daily. Pt saw Dr Onnie Graham on 12/15/14 and felt like got brush off. Aimee Jones wants to know if could get referral to a pain clinic. Aimee Jones request cb.

## 2014-12-16 NOTE — Addendum Note (Signed)
Addended by: Rosalin Hawking on: 12/16/2014 06:41 PM   Modules accepted: Orders

## 2014-12-17 MED ORDER — OXYCODONE-ACETAMINOPHEN 5-325 MG PO TABS: 1.0000 | ORAL_TABLET | ORAL | Status: DC | PRN

## 2014-12-17 NOTE — Telephone Encounter (Signed)
Printed, referral is in.  Thanks.

## 2014-12-17 NOTE — Telephone Encounter (Signed)
Husband advised.  Rx left at front desk for pick up.  

## 2014-12-17 NOTE — Progress Notes (Signed)
Loop recorder 

## 2014-12-21 ENCOUNTER — Encounter: Payer: Self-pay | Admitting: Internal Medicine

## 2014-12-22 ENCOUNTER — Other Ambulatory Visit: Payer: Self-pay | Admitting: Neurology

## 2014-12-22 DIAGNOSIS — M79602 Pain in left arm: Principal | ICD-10-CM

## 2014-12-22 DIAGNOSIS — M79601 Pain in right arm: Secondary | ICD-10-CM

## 2014-12-23 ENCOUNTER — Ambulatory Visit (HOSPITAL_COMMUNITY)
Admission: RE | Admit: 2014-12-23 | Discharge: 2014-12-23 | Disposition: A | Discharge status: Home / Self Care | Payer: Medicare Other | Source: Ambulatory Visit | Attending: Vascular Surgery | Admitting: Vascular Surgery

## 2014-12-23 ENCOUNTER — Telehealth: Payer: Self-pay | Admitting: Neurology

## 2014-12-23 DIAGNOSIS — M79601 Pain in right arm: Secondary | ICD-10-CM | POA: Insufficient documentation

## 2014-12-23 DIAGNOSIS — M79602 Pain in left arm: Secondary | ICD-10-CM | POA: Diagnosis not present

## 2014-12-23 NOTE — Telephone Encounter (Signed)
Thank you for letting me know.   Rosalin Hawking, MD PhD Stroke Neurology 12/23/2014 5:01 PM

## 2014-12-23 NOTE — Progress Notes (Signed)
Preliminary results attempted to be called @ 3:20 spoke with Hinton Dyer said she would have them return my call.

## 2014-12-23 NOTE — Progress Notes (Signed)
Preliminary report given to Aimee Jones at [facility] neurological @ 4:45pm.

## 2014-12-23 NOTE — Telephone Encounter (Signed)
Aimee Jones with [facility] @ [phone], calling with verbal preliminary report.  Stated she has to give report verbally today.  Please return call.

## 2014-12-23 NOTE — Telephone Encounter (Signed)
Spoke with Stephanie([facility]) and she gave a verbal preliminary report- negative deep vein thrombosis,positive for nonoccluding superficial thrombus of the right cephalic at the elbow. The report will be ready to view in epic on tomorrow.

## 2014-12-24 ENCOUNTER — Ambulatory Visit (INDEPENDENT_AMBULATORY_CARE_PROVIDER_SITE_OTHER): Payer: Medicare Other | Admitting: Neurology

## 2014-12-24 ENCOUNTER — Other Ambulatory Visit: Payer: Self-pay

## 2014-12-24 ENCOUNTER — Encounter: Payer: Self-pay | Admitting: Neurology

## 2014-12-24 ENCOUNTER — Ambulatory Visit (INDEPENDENT_AMBULATORY_CARE_PROVIDER_SITE_OTHER): Payer: Self-pay | Admitting: Neurology

## 2014-12-24 DIAGNOSIS — M79602 Pain in left arm: Secondary | ICD-10-CM | POA: Diagnosis not present

## 2014-12-24 DIAGNOSIS — M79601 Pain in right arm: Secondary | ICD-10-CM

## 2014-12-24 DIAGNOSIS — M25519 Pain in unspecified shoulder: Secondary | ICD-10-CM | POA: Diagnosis not present

## 2014-12-24 DIAGNOSIS — J3081 Allergic rhinitis due to animal (cat) (dog) hair and dander: Secondary | ICD-10-CM | POA: Diagnosis not present

## 2014-12-24 DIAGNOSIS — J3089 Other allergic rhinitis: Secondary | ICD-10-CM | POA: Diagnosis not present

## 2014-12-24 NOTE — Progress Notes (Signed)
Please refer to EMG and NCV procedure note. 

## 2014-12-24 NOTE — Procedures (Signed)
     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 The patient has ongoing pain in both shoulders, left greater than right with some neck discomfort as well. The patient is being evaluated for possible cervical radiculopathy.  NERVE CONDUCTION STUDIES:  Nerve conduction studies were performed on both upper extremities. The distal motor latencies and motor amplitudes for the median and ulnar nerves were within normal limits. The F wave latencies and nerve conduction velocities for these nerves were also normal. The sensory latencies for the median and ulnar nerves were normal.   EMG STUDIES:  EMG study was performed on the right upper extremity:  The first dorsal interosseous muscle reveals 2 to 4 K units with full recruitment. No fibrillations or positive waves were noted. The abductor pollicis brevis muscle reveals 2 to 4 K units with full recruitment. No fibrillations or positive waves were noted. The extensor indicis proprius muscle reveals 1 to 3 K units with full recruitment. No fibrillations or positive waves were noted. The pronator teres muscle reveals 2 to 3 K units with full recruitment. No fibrillations or positive waves were noted. The biceps muscle reveals 1 to 2 K units with full recruitment. No fibrillations or positive waves were noted. The triceps muscle reveals 2 to 4 K units with full recruitment. No fibrillations or positive waves were noted. The anterior deltoid muscle reveals 2 to 3 K units with full recruitment. No fibrillations or positive waves were noted. The cervical paraspinal muscles were tested at 2 levels. No abnormalities of insertional activity were seen at either level tested. There was good relaxation.  EMG study was performed on the left upper extremity:  The first dorsal interosseous muscle reveals 2  to 4 K units with full recruitment. No fibrillations or positive waves were noted. The abductor pollicis brevis muscle reveals 2 to 4 K units with full recruitment. No fibrillations or positive waves were noted. The extensor indicis proprius muscle reveals 1 to 3 K units with full recruitment. No fibrillations or positive waves were noted. The pronator teres muscle reveals 2 to 3 K units with full recruitment. No fibrillations or positive waves were noted. The biceps muscle reveals 1 to 2 K units with full recruitment. No fibrillations or positive waves were noted. The triceps muscle reveals 2 to 4 K units with full recruitment. No fibrillations or positive waves were noted. The anterior deltoid muscle reveals 2 to 3 K units with full recruitment. No fibrillations or positive waves were noted. The cervical paraspinal muscles were tested at 2 levels. No abnormalities of insertional activity were seen at either level tested. There was good relaxation.   IMPRESSION:  Nerve conduction studies done on both upper extremities were within normal limits. No evidence of a neuropathy is seen. EMG evaluation of both upper extremities is unremarkable, without evidence of a cervical radiculopathy on either side.  Jill Alexanders MD 12/24/2014 2:11 PM  [facility] [address] [place], [place] [address]  Phone [phone] Fax [phone]

## 2014-12-24 NOTE — Telephone Encounter (Signed)
Note was left requesting rx for oxycodone apap. Call when ready for pick up. Pt does not have enough med to last thru weekend.Shoulder pain is getting worse;pt has appt with pain mgt on 12/29/14.Nerve conduction test was done today and report will be forwarded to Dr [name].Pt last seen 12/10/14 and oxycodone apap last printed 12/17/14 # 60.

## 2014-12-25 ENCOUNTER — Encounter: Payer: Self-pay | Admitting: *Deleted

## 2014-12-25 DIAGNOSIS — Z79899 Other long term (current) drug therapy: Secondary | ICD-10-CM | POA: Diagnosis not present

## 2014-12-25 DIAGNOSIS — Z79891 Long term (current) use of opiate analgesic: Secondary | ICD-10-CM | POA: Diagnosis not present

## 2014-12-25 MED ORDER — OXYCODONE-ACETAMINOPHEN 5-325 MG PO TABS: 1.0000 | ORAL_TABLET | ORAL | Status: DC | PRN

## 2014-12-25 NOTE — Telephone Encounter (Signed)
They have to keep the pain clinic appointment.  I can't keep accelerating the rx.  They'll have to make this last for at least 12 days, ie max 5 pills per day.   Printed.

## 2014-12-25 NOTE — Telephone Encounter (Signed)
Spoke to pts husband and advised Rx is available for pickup from the front desk; also advised per Dr [name]. Husband informed pt unable to have third party pickup Rx

## 2014-12-27 ENCOUNTER — Other Ambulatory Visit: Payer: Self-pay | Admitting: Family Medicine

## 2014-12-28 NOTE — Telephone Encounter (Signed)
Sent. Thanks.   

## 2014-12-28 NOTE — Telephone Encounter (Signed)
Electronic refill request. Last Filled:    30 capsule 2 RF on 09/16/2014  Please advise.

## 2014-12-29 ENCOUNTER — Ambulatory Visit
Admission: RE | Admit: 2014-12-29 | Discharge: 2014-12-29 | Disposition: A | Discharge status: Home / Self Care | Payer: Medicare Other | Source: Ambulatory Visit | Attending: Nurse Practitioner | Admitting: Nurse Practitioner

## 2014-12-29 ENCOUNTER — Other Ambulatory Visit: Payer: Self-pay | Admitting: Nurse Practitioner

## 2014-12-29 DIAGNOSIS — M542 Cervicalgia: Secondary | ICD-10-CM | POA: Diagnosis not present

## 2014-12-29 DIAGNOSIS — M25511 Pain in right shoulder: Secondary | ICD-10-CM | POA: Diagnosis not present

## 2014-12-29 DIAGNOSIS — Z79899 Other long term (current) drug therapy: Secondary | ICD-10-CM | POA: Diagnosis not present

## 2014-12-29 DIAGNOSIS — M75102 Unspecified rotator cuff tear or rupture of left shoulder, not specified as traumatic: Secondary | ICD-10-CM | POA: Diagnosis not present

## 2014-12-29 DIAGNOSIS — G894 Chronic pain syndrome: Secondary | ICD-10-CM | POA: Diagnosis not present

## 2014-12-29 DIAGNOSIS — M25519 Pain in unspecified shoulder: Secondary | ICD-10-CM

## 2014-12-29 DIAGNOSIS — Z79891 Long term (current) use of opiate analgesic: Secondary | ICD-10-CM | POA: Diagnosis not present

## 2014-12-29 DIAGNOSIS — M25512 Pain in left shoulder: Secondary | ICD-10-CM | POA: Diagnosis not present

## 2014-12-29 DIAGNOSIS — M5032 Other cervical disc degeneration, mid-cervical region: Secondary | ICD-10-CM | POA: Diagnosis not present

## 2014-12-29 DIAGNOSIS — M5033 Other cervical disc degeneration, cervicothoracic region: Secondary | ICD-10-CM | POA: Diagnosis not present

## 2014-12-31 DIAGNOSIS — J3089 Other allergic rhinitis: Secondary | ICD-10-CM | POA: Diagnosis not present

## 2014-12-31 DIAGNOSIS — J3081 Allergic rhinitis due to animal (cat) (dog) hair and dander: Secondary | ICD-10-CM | POA: Diagnosis not present

## 2014-12-31 LAB — MDC_IDC_ENUM_SESS_TYPE_REMOTE: MDC IDC SESS DTM: 20160303050500

## 2015-01-05 ENCOUNTER — Encounter: Payer: Self-pay | Admitting: Internal Medicine

## 2015-01-05 ENCOUNTER — Other Ambulatory Visit: Payer: Self-pay | Admitting: Family Medicine

## 2015-01-05 DIAGNOSIS — J3089 Other allergic rhinitis: Secondary | ICD-10-CM | POA: Diagnosis not present

## 2015-01-05 DIAGNOSIS — J3081 Allergic rhinitis due to animal (cat) (dog) hair and dander: Secondary | ICD-10-CM | POA: Diagnosis not present

## 2015-01-05 NOTE — Telephone Encounter (Signed)
Electronic refill request. Last Filled:    90 tablet 1 RF on 10/22/2014  Please advise.

## 2015-01-06 NOTE — Telephone Encounter (Signed)
Please call in.  Thanks.   

## 2015-01-06 NOTE — Telephone Encounter (Signed)
Rx called to pharmacy as instructed. 

## 2015-01-10 ENCOUNTER — Other Ambulatory Visit: Payer: Self-pay | Admitting: Family Medicine

## 2015-01-12 DIAGNOSIS — J3089 Other allergic rhinitis: Secondary | ICD-10-CM | POA: Diagnosis not present

## 2015-01-12 DIAGNOSIS — J3081 Allergic rhinitis due to animal (cat) (dog) hair and dander: Secondary | ICD-10-CM | POA: Diagnosis not present

## 2015-01-13 ENCOUNTER — Ambulatory Visit (INDEPENDENT_AMBULATORY_CARE_PROVIDER_SITE_OTHER): Payer: Medicare Other | Admitting: *Deleted

## 2015-01-13 ENCOUNTER — Encounter: Payer: Self-pay | Admitting: Family Medicine

## 2015-01-13 DIAGNOSIS — I6322 Cerebral infarction due to unspecified occlusion or stenosis of basilar arteries: Secondary | ICD-10-CM

## 2015-01-13 DIAGNOSIS — I639 Cerebral infarction, unspecified: Secondary | ICD-10-CM | POA: Diagnosis not present

## 2015-01-13 LAB — MDC_IDC_ENUM_SESS_TYPE_REMOTE

## 2015-01-14 NOTE — Progress Notes (Signed)
Loop recorder 

## 2015-01-15 ENCOUNTER — Encounter: Payer: Self-pay | Admitting: Internal Medicine

## 2015-01-19 DIAGNOSIS — Z79899 Other long term (current) drug therapy: Secondary | ICD-10-CM | POA: Diagnosis not present

## 2015-01-19 DIAGNOSIS — G309 Alzheimer's disease, unspecified: Secondary | ICD-10-CM | POA: Diagnosis not present

## 2015-01-19 DIAGNOSIS — M75102 Unspecified rotator cuff tear or rupture of left shoulder, not specified as traumatic: Secondary | ICD-10-CM | POA: Diagnosis not present

## 2015-01-19 DIAGNOSIS — G894 Chronic pain syndrome: Secondary | ICD-10-CM | POA: Diagnosis not present

## 2015-01-19 DIAGNOSIS — Z79891 Long term (current) use of opiate analgesic: Secondary | ICD-10-CM | POA: Diagnosis not present

## 2015-01-19 DIAGNOSIS — M542 Cervicalgia: Secondary | ICD-10-CM | POA: Diagnosis not present

## 2015-01-20 ENCOUNTER — Telehealth: Payer: Self-pay | Admitting: *Deleted

## 2015-01-20 ENCOUNTER — Other Ambulatory Visit: Payer: Self-pay | Admitting: Pain Medicine

## 2015-01-20 DIAGNOSIS — M542 Cervicalgia: Secondary | ICD-10-CM

## 2015-01-20 NOTE — Telephone Encounter (Signed)
I called and spoke to husband and relayed that message of Dr. Erlinda Hong that EMG/NCS was normal.  If she has questions to call us back.  He verbalized understanding.

## 2015-01-20 NOTE — Telephone Encounter (Signed)
-----   Message from Rosalin Hawking, MD sent at 12/25/2014  3:29 PM EST ----- Regarding: normal EMG study Hi, Tashia:  Could you please let the patient know that her nerve conduction study was normal? Thank you.  Rosalin Hawking, MD PhD Stroke Neurology 12/25/2014 3:30 PM

## 2015-01-21 DIAGNOSIS — J3081 Allergic rhinitis due to animal (cat) (dog) hair and dander: Secondary | ICD-10-CM | POA: Diagnosis not present

## 2015-01-21 DIAGNOSIS — J3089 Other allergic rhinitis: Secondary | ICD-10-CM | POA: Diagnosis not present

## 2015-01-24 ENCOUNTER — Encounter (HOSPITAL_COMMUNITY): Payer: Self-pay | Admitting: Emergency Medicine

## 2015-01-24 ENCOUNTER — Emergency Department (INDEPENDENT_AMBULATORY_CARE_PROVIDER_SITE_OTHER)
Admission: EM | Admit: 2015-01-24 | Discharge: 2015-01-24 | Disposition: A | Discharge status: Home / Self Care | Payer: Medicare Other | Source: Home / Self Care | Attending: Family Medicine | Admitting: Family Medicine

## 2015-01-24 ENCOUNTER — Other Ambulatory Visit (HOSPITAL_COMMUNITY)
Admission: RE | Admit: 2015-01-24 | Discharge: 2015-01-24 | Disposition: A | Discharge status: Home / Self Care | Payer: Medicare Other | Source: Ambulatory Visit | Attending: Vascular Surgery | Admitting: Vascular Surgery

## 2015-01-24 DIAGNOSIS — J029 Acute pharyngitis, unspecified: Secondary | ICD-10-CM | POA: Diagnosis not present

## 2015-01-24 LAB — POCT RAPID STREP A: Streptococcus, Group A Screen (Direct): NEGATIVE

## 2015-01-24 MED ORDER — OMEPRAZOLE 20 MG PO CPDR: DELAYED_RELEASE_CAPSULE | ORAL | Status: DC

## 2015-01-24 MED ORDER — FLUTICASONE PROPIONATE 50 MCG/ACT NA SUSP: 2.0000 | Freq: Two times a day (BID) | NASAL | Status: DC

## 2015-01-24 NOTE — Discharge Instructions (Signed)

## 2015-01-24 NOTE — ED Notes (Signed)
C/o  Sore throat since yesterday morning.  Having pain with swallowing.  Denies fever, n/v/d.  No relief with otc meds.

## 2015-01-24 NOTE — ED Provider Notes (Signed)
CSN: [id number]     Arrival date & time 01/24/15  1135 History   First MD Initiated Contact with Patient 01/24/15 1215     Chief Complaint  Patient presents with  . Sore Throat   (Consider location/radiation/quality/duration/timing/severity/associated sxs/prior Treatment) HPI        69 year old female with multiple chronic medical problems presents complaining of sore throat. She has had a bad sore throat, dry mouth, and difficulty swallowing since yesterday. Additionally she admits to some itching of her eyes, mild nasal congestion, as well as history of GERD. No over-the-counter medications taken for treatment. No fever, chills, cough, chest pain, shortness of breath. No recent travel or sick contacts.  Past Medical History  Diagnosis Date  . Hyperlipidemia   . ETOH abuse   . Allergy   . Diverticulosis   . Hypertension   . Depression   . Asthma   . GERD (gastroesophageal reflux disease)   . Normal 24 hour ambulatory pH monitoring study     good acid suppression  . Dementia   . Shortness of breath   . Anxiety     panic attacks- on occas.   . Arthritis     osteoporosis - especially hips.   . Stroke    Past Surgical History  Procedure Laterality Date  . Dobutamine stress echo  07/09/2001    normal  . Esophagogastroduodenoscopy  06/11/2006    normal  . Abdominal hysterectomy    . Total hip arthroplasty      left 1991/right 2008  . Nasal sinus surgery  1990  . Eye surgery      cataracts removed, IOL- both eyes   . Breast surgery      3x- biposies   . Shoulder arthroscopy Left   . Reverse shoulder arthroplasty Right 08/13/2014    Procedure: REVERSE SHOULDER ARTHROPLASTY;  Surgeon: Marin Shutter, MD;  Location: [facility];  Service: Orthopedics;  Laterality: Right;  interscalene block  . Joint replacement      bilateral hips  . Loop recorder implant N/A 10/14/2014    Procedure: LOOP RECORDER IMPLANT;  Surgeon: Evans Lance, MD;  Location: [facility] CATH LAB;  Service:  Cardiovascular;  Laterality: N/A;  . Tee without cardioversion N/A 10/14/2014    Procedure: TRANSESOPHAGEAL ECHOCARDIOGRAM (TEE);  Surgeon: Sueanne Margarita, MD;  Location: [facility] ENDOSCOPY;  Service: Cardiovascular;  Laterality: N/A;   Family History  Problem Relation Age of Onset  . Sudden death Brother   . Heart attack Brother   . Hypertension Brother   . Esophageal cancer Brother   . Heart attack Father   . Heart block Mother   . Stroke Brother   . Stroke Mother    History  Substance Use Topics  . Smoking status: Never Smoker   . Smokeless tobacco: Never Used  . Alcohol Use: 0.0 oz/week    0 Standard drinks or equivalent per week     Comment: grand Mariener - almost daily- 1/2 shot   OB History    No data available     Review of Systems  Constitutional: Negative for fever and chills.  HENT: Positive for congestion, rhinorrhea and sore throat.   Eyes: Positive for itching.  Respiratory: Negative for cough and shortness of breath.   Cardiovascular: Negative for chest pain.  All other systems reviewed and are negative.   Allergies  Latex and Penicillins  Home Medications   Prior to Admission medications   Medication Sig Start Date End Date Taking? Authorizing  Provider  atorvastatin (LIPITOR) 10 MG tablet Take 10 mg by mouth daily at 6 PM.    Yes Historical Provider, MD  citalopram (CELEXA) 40 MG tablet Take 40 mg by mouth at bedtime.    Yes Historical Provider, MD  donepezil (ARICEPT) 10 MG tablet Take 10 mg by mouth daily before breakfast.    Yes Historical Provider, MD  doxepin (SINEQUAN) 25 MG capsule TAKE 1 CAPSULE BY MOUTH DAILY AT BEDTIME 12/28/14  Yes Tonia Ghent, MD  fexofenadine (ALLEGRA) 180 MG tablet Take 180 mg by mouth daily before breakfast.    Yes Historical Provider, MD  fish oil-omega-3 fatty acids 1000 MG capsule Take 1 g by mouth daily.    Yes Historical Provider, MD  fluticasone (FLONASE) 50 MCG/ACT nasal spray Place 2 sprays into both nostrils  daily.   Yes Historical Provider, MD  montelukast (SINGULAIR) 10 MG tablet Take 10 mg by mouth at bedtime.  02/21/13  Yes Historical Provider, MD  Multiple Vitamins-Minerals (MULTIVITAMIN PO) Take 1 tablet by mouth daily.   Yes Historical Provider, MD  nabumetone (RELAFEN) 750 MG tablet TAKE 1 TABLET BY MOUTH 2 TIMES DAILY AS NEEDED FOR PAIN. 01/11/15  Yes Tonia Ghent, MD  olmesartan-hydrochlorothiazide (BENICAR HCT) 40-25 MG per tablet Take 0.5 tablets by mouth at bedtime. 10/19/14  Yes Tonia Ghent, MD  omeprazole (PRILOSEC) 20 MG capsule Take 20 mg by mouth daily.   Yes Historical Provider, MD  oxyCODONE-acetaminophen (PERCOCET) 5-325 MG per tablet Take 1-2 tablets by mouth every 4 (four) hours as needed for severe pain. 12/25/14  Yes Tonia Ghent, MD  promethazine (PHENERGAN) 25 MG tablet TAKE 1 TABLET BY MOUTH EVERY 6 HOURS AS NEEDED FOR NAUSEA 10/22/14  Yes Tonia Ghent, MD  thiamine (VITAMIN B-1) 100 MG tablet Take 100 mg by mouth daily.   Yes Historical Provider, MD  VITAMIN E PO Take 1 capsule by mouth daily.    Yes Historical Provider, MD  ALPRAZolam Duanne Moron) 0.5 MG tablet TAKE 1 TABLET BY MOUTH 3 TIMES A DAY AS NEEDED 01/06/15   Tonia Ghent, MD  B Complex-C (SUPER B COMPLEX PO) Take 150 mg by mouth daily.     Historical Provider, MD  budesonide-formoterol (SYMBICORT) 160-4.5 MCG/ACT inhaler Inhale 2 puffs into the lungs 2 (two) times daily as needed (shortness of breath). 08/12/14   Tonia Ghent, MD  clopidogrel (PLAVIX) 75 MG tablet Take 1 tablet (75 mg total) by mouth daily. 12/11/14   Rosalin Hawking, MD  donepezil (ARICEPT) 10 MG tablet TAKE 1 TABLET BY MOUTH DAILY AT BEDTIME 12/11/14   Tonia Ghent, MD  fluticasone [facility]) 50 MCG/ACT nasal spray Place 2 sprays into both nostrils 2 (two) times daily. Decrease to 2 sprays/nostril daily after 5 days 01/24/15   Liam Graham, PA-C  guaiFENesin (MUCINEX) 600 MG 12 hr tablet Take 2 tablets (1,200 mg total) by mouth 2 (two) times  daily. 10/14/14   Maryann Mikhail, DO  methocarbamol (ROBAXIN) 500 MG tablet Take 1 tablet (500 mg total) by mouth every 6 (six) hours as needed for muscle spasms. 12/10/14   Tonia Ghent, MD  omeprazole (PRILOSEC) 20 MG capsule 1 capsule twice daily for 10 days, then decrease to normal once daily dosing 01/24/15   Liam Graham, PA-C   BP 169/90 mmHg  Pulse 80  Temp(Src) 97.7 F (36.5 C) (Oral)  Resp 16  SpO2 100% Physical Exam  Constitutional: She is oriented to person, place,  and time. Vital signs are normal. She appears well-developed and well-nourished. No distress.  HENT:  Head: Normocephalic and atraumatic.  Right Ear: External ear normal.  Left Ear: External ear normal.  Nose: Nose normal.  Mouth/Throat: Posterior oropharyngeal erythema (mild) present. No oropharyngeal exudate.  Eyes: Conjunctivae are normal.  Neck: Normal range of motion. Neck supple.  Cardiovascular: Normal rate, regular rhythm and normal heart sounds.   Pulmonary/Chest: Effort normal and breath sounds normal. No respiratory distress.  Lymphadenopathy:    She has no cervical adenopathy.  Neurological: She is alert and oriented to person, place, and time. She has normal strength. Coordination normal.  Skin: Skin is warm and dry. No rash noted. She is not diaphoretic.  Psychiatric: She has a normal mood and affect. Judgment normal.  Nursing note and vitals reviewed.   ED Course  Procedures (including critical care time) Labs Review Labs Reviewed  POCT RAPID STREP A ([facility] CARE ONLY)    Imaging Review No results found.   MDM   1. Sore throat    No obvious infection. Her throat is very mildly erythematous. She has a history of reflux and is on capsule 20 mg daily, silent reflux could explain her symptoms. Also she has some itchy eyes and rhinorrhea, this may be due to allergies. We will treat her today with Flonase as well as increasing her omeprazole to twice daily. Also advised sore throat  lozenges, gargle salt water. Follow-up when necessary   Meds ordered this encounter  Medications  . omeprazole (PRILOSEC) 20 MG capsule    Sig: 1 capsule twice daily for 10 days, then decrease to normal once daily dosing    Dispense:  40 capsule    Refill:  0  . fluticasone (FLONASE) 50 MCG/ACT nasal spray    Sig: Place 2 sprays into both nostrils 2 (two) times daily. Decrease to 2 sprays/nostril daily after 5 days    Dispense:  16 g    Refill:  2      Liam Graham, PA-C 01/24/15 1247

## 2015-01-25 DIAGNOSIS — H02135 Senile ectropion of left lower eyelid: Secondary | ICD-10-CM | POA: Diagnosis not present

## 2015-01-25 DIAGNOSIS — H02102 Unspecified ectropion of right lower eyelid: Secondary | ICD-10-CM | POA: Diagnosis not present

## 2015-01-26 DIAGNOSIS — J4530 Mild persistent asthma, uncomplicated: Secondary | ICD-10-CM | POA: Diagnosis not present

## 2015-01-26 DIAGNOSIS — J3081 Allergic rhinitis due to animal (cat) (dog) hair and dander: Secondary | ICD-10-CM | POA: Diagnosis not present

## 2015-01-26 DIAGNOSIS — J3089 Other allergic rhinitis: Secondary | ICD-10-CM | POA: Diagnosis not present

## 2015-01-26 DIAGNOSIS — H1045 Other chronic allergic conjunctivitis: Secondary | ICD-10-CM | POA: Diagnosis not present

## 2015-01-26 LAB — CULTURE, GROUP A STREP: STREP A CULTURE: NEGATIVE

## 2015-02-01 ENCOUNTER — Ambulatory Visit
Admission: RE | Admit: 2015-02-01 | Discharge: 2015-02-01 | Disposition: A | Discharge status: Home / Self Care | Payer: Medicare Other | Source: Ambulatory Visit | Attending: Pain Medicine | Admitting: Pain Medicine

## 2015-02-01 DIAGNOSIS — M47813 Spondylosis without myelopathy or radiculopathy, cervicothoracic region: Secondary | ICD-10-CM | POA: Diagnosis not present

## 2015-02-01 DIAGNOSIS — M4802 Spinal stenosis, cervical region: Secondary | ICD-10-CM | POA: Diagnosis not present

## 2015-02-01 DIAGNOSIS — M542 Cervicalgia: Secondary | ICD-10-CM

## 2015-02-02 DIAGNOSIS — J3089 Other allergic rhinitis: Secondary | ICD-10-CM | POA: Diagnosis not present

## 2015-02-02 DIAGNOSIS — J3081 Allergic rhinitis due to animal (cat) (dog) hair and dander: Secondary | ICD-10-CM | POA: Diagnosis not present

## 2015-02-08 ENCOUNTER — Other Ambulatory Visit: Payer: Self-pay | Admitting: Family Medicine

## 2015-02-08 ENCOUNTER — Institutional Professional Consult (permissible substitution): Payer: Medicare Other | Admitting: Pulmonary Disease

## 2015-02-08 NOTE — Telephone Encounter (Signed)
Sent. Thanks.   

## 2015-02-08 NOTE — Telephone Encounter (Signed)
Received refill request electronically. Last refill 10/22/14 #20, last office visit 12/10/14. Is it okay to refill medication?

## 2015-02-12 ENCOUNTER — Ambulatory Visit (INDEPENDENT_AMBULATORY_CARE_PROVIDER_SITE_OTHER): Payer: Medicare Other | Admitting: *Deleted

## 2015-02-12 DIAGNOSIS — I6322 Cerebral infarction due to unspecified occlusion or stenosis of basilar arteries: Secondary | ICD-10-CM

## 2015-02-12 DIAGNOSIS — I639 Cerebral infarction, unspecified: Secondary | ICD-10-CM

## 2015-02-16 DIAGNOSIS — J3089 Other allergic rhinitis: Secondary | ICD-10-CM | POA: Diagnosis not present

## 2015-02-16 DIAGNOSIS — Z79899 Other long term (current) drug therapy: Secondary | ICD-10-CM | POA: Diagnosis not present

## 2015-02-16 DIAGNOSIS — Z79891 Long term (current) use of opiate analgesic: Secondary | ICD-10-CM | POA: Diagnosis not present

## 2015-02-16 DIAGNOSIS — J3081 Allergic rhinitis due to animal (cat) (dog) hair and dander: Secondary | ICD-10-CM | POA: Diagnosis not present

## 2015-02-16 DIAGNOSIS — M47812 Spondylosis without myelopathy or radiculopathy, cervical region: Secondary | ICD-10-CM | POA: Diagnosis not present

## 2015-02-16 DIAGNOSIS — G894 Chronic pain syndrome: Secondary | ICD-10-CM | POA: Diagnosis not present

## 2015-02-16 DIAGNOSIS — M5030 Other cervical disc degeneration, unspecified cervical region: Secondary | ICD-10-CM | POA: Diagnosis not present

## 2015-02-17 ENCOUNTER — Encounter: Payer: Self-pay | Admitting: Internal Medicine

## 2015-02-17 NOTE — Progress Notes (Signed)
Loop recorder 

## 2015-02-23 DIAGNOSIS — J3089 Other allergic rhinitis: Secondary | ICD-10-CM | POA: Diagnosis not present

## 2015-02-23 DIAGNOSIS — J3081 Allergic rhinitis due to animal (cat) (dog) hair and dander: Secondary | ICD-10-CM | POA: Diagnosis not present

## 2015-02-26 ENCOUNTER — Other Ambulatory Visit: Payer: Self-pay | Admitting: Family Medicine

## 2015-02-26 NOTE — Telephone Encounter (Signed)
Electronic refill request. Last Filled:    90 tablet 1 RF on  01/06/2015  Please advise.

## 2015-02-28 NOTE — Telephone Encounter (Signed)
Too early now, they can get it filled later this week.  See fill on/after date. Please call in.  Thanks.

## 2015-03-01 NOTE — Telephone Encounter (Signed)
Medication phoned to pharmacy with explicit notification that it is not to be filled until on/after 03/05/2015.

## 2015-03-02 ENCOUNTER — Telehealth: Payer: Self-pay

## 2015-03-02 DIAGNOSIS — J3089 Other allergic rhinitis: Secondary | ICD-10-CM | POA: Diagnosis not present

## 2015-03-02 DIAGNOSIS — J3081 Allergic rhinitis due to animal (cat) (dog) hair and dander: Secondary | ICD-10-CM | POA: Diagnosis not present

## 2015-03-02 MED ORDER — OXYCODONE-ACETAMINOPHEN 5-325 MG PO TABS: 1.0000 | ORAL_TABLET | ORAL | Status: DC | PRN

## 2015-03-02 NOTE — Telephone Encounter (Signed)
See 02/26/15 refill note.

## 2015-03-02 NOTE — Telephone Encounter (Signed)
Patient advised.  Rx left at front desk for pick up. 

## 2015-03-02 NOTE — Telephone Encounter (Signed)
PLEASE NOTE: All timestamps contained within this report are represented as [place] Standard Time. CONFIDENTIALTY NOTICE: This fax transmission is intended only for the addressee. It contains information that is legally privileged, confidential or otherwise protected from use or disclosure. If you are not the intended recipient, you are strictly prohibited from reviewing, disclosing, copying using or disseminating any of this information or taking any action in reliance on or regarding this information. If you have received this fax in error, please notify us immediately by telephone so that we can arrange for its return to [place]. Phone: [phone], Toll-Free: [phone], Fax: [phone] Page: 1 of 1 Call Id: [id number] [place] Patient Name: Aimee Jones Gender: Unknown DOB: 06-25-1946 Age: 69 Y 10 M 8 D Return Phone Number: [phone] (Primary), [phone] (Secondary) Address: City/State/Zip: [place] Client [place] Night - Client Client Site [place] Physician Renford Dills Contact Type Call Call Type Triage / Clinical Relationship To Patient Self Return Phone Number [phone] (Primary) Chief Complaint Neck Pain Initial Comment Caller states she is needing pain meds or sleep meds because she has pain in her shoulders and back and neck. Nurse Assessment Nurse: Sherral Hammers, RN, Megan Date/Time (Eastern Time): 03/01/2015 5:12:03 PM Confirm and document reason for call. If symptomatic, describe symptoms. ---Caller states she is needing pain meds or sleep meds because she has pain in her shoulders and back and neck. Is requesting a script for xanax. Does not want doxepin. Has the patient traveled out of the country within the last 30 days? ---Not Applicable Does the patient require triage? ---Declined Triage Please document clinical  information provided and list any resource used. ---Instructed caller that she will have to call back during normal office hours regarding a script for xanax. Caller agrees. No further questions or concerns at this time. Guidelines Guideline Title Affirmed Question Affirmed Notes Nurse Date/Time (Eastern Time) Disp. Time Eilene Ghazi Time) Disposition Final User 03/01/2015 5:14:55 PM Clinical Call Yes Sherral Hammers, RN, Jinny Blossom After Care Instructions Given Call Event Type User Date / Time Description

## 2015-03-02 NOTE — Telephone Encounter (Signed)
I can't fill her xanax early.  She should have enough left over.  She had 90 with on refill done on 01/06/15.  If she has run out early, then I can't help that.  She needs to have her meds managed so that she doesn't run out early.   I am willing to fill her percocet for her shoulder pain.  Printed.  I'll sign when I get to clinic. She should take the least amount possible, sedation caution.  Thanks.

## 2015-03-04 LAB — CUP PACEART REMOTE DEVICE CHECK: MDC IDC SESS DTM: 20160519110841

## 2015-03-05 ENCOUNTER — Other Ambulatory Visit: Payer: Self-pay | Admitting: Family Medicine

## 2015-03-05 NOTE — Telephone Encounter (Signed)
Sent. Thanks.   

## 2015-03-05 NOTE — Telephone Encounter (Signed)
Electronic refill request, pt had a f/u with Dr. [name] on 12/10/14, last refilled on 02/08/15 #20 with 0 refills, please advise

## 2015-03-07 ENCOUNTER — Other Ambulatory Visit: Payer: Self-pay | Admitting: Family Medicine

## 2015-03-08 DIAGNOSIS — Z79899 Other long term (current) drug therapy: Secondary | ICD-10-CM | POA: Diagnosis not present

## 2015-03-08 DIAGNOSIS — M542 Cervicalgia: Secondary | ICD-10-CM | POA: Diagnosis not present

## 2015-03-08 DIAGNOSIS — M25519 Pain in unspecified shoulder: Secondary | ICD-10-CM | POA: Diagnosis not present

## 2015-03-08 DIAGNOSIS — G894 Chronic pain syndrome: Secondary | ICD-10-CM | POA: Diagnosis not present

## 2015-03-08 NOTE — Telephone Encounter (Signed)
Received refill request electronically from pharmacy. Last refill 01/11/15 #60/1, last office visit 12/10/14. Is it okay to refill medication?

## 2015-03-09 ENCOUNTER — Telehealth: Payer: Self-pay | Admitting: Neurology

## 2015-03-09 DIAGNOSIS — J3081 Allergic rhinitis due to animal (cat) (dog) hair and dander: Secondary | ICD-10-CM | POA: Diagnosis not present

## 2015-03-09 DIAGNOSIS — J3089 Other allergic rhinitis: Secondary | ICD-10-CM | POA: Diagnosis not present

## 2015-03-09 NOTE — Telephone Encounter (Signed)
Sent. Thanks.  Needs CPE scheduled.   

## 2015-03-09 NOTE — Telephone Encounter (Signed)
Please schedule appointment as instructed. 

## 2015-03-09 NOTE — Telephone Encounter (Signed)
[place] @ [facility] @[phone] is faxing over a form to be signed off for this patient as they need her to stop her blood thinner(Plavix) for 5 days prior to  A cervical steroid injection procedure.

## 2015-03-10 NOTE — Telephone Encounter (Signed)
Scheduled CPE and labs  Per Dr. Keturah Barre / lt

## 2015-03-12 ENCOUNTER — Ambulatory Visit (INDEPENDENT_AMBULATORY_CARE_PROVIDER_SITE_OTHER): Payer: Medicare Other | Admitting: *Deleted

## 2015-03-12 DIAGNOSIS — I639 Cerebral infarction, unspecified: Secondary | ICD-10-CM

## 2015-03-12 DIAGNOSIS — I6322 Cerebral infarction due to unspecified occlusion or stenosis of basilar arteries: Secondary | ICD-10-CM

## 2015-03-16 ENCOUNTER — Telehealth: Payer: Self-pay | Admitting: *Deleted

## 2015-03-16 DIAGNOSIS — J3081 Allergic rhinitis due to animal (cat) (dog) hair and dander: Secondary | ICD-10-CM | POA: Diagnosis not present

## 2015-03-16 DIAGNOSIS — J3089 Other allergic rhinitis: Secondary | ICD-10-CM | POA: Diagnosis not present

## 2015-03-16 MED ORDER — FLUTICASONE PROPIONATE 50 MCG/ACT NA SUSP: 1.0000 | Freq: Every day | NASAL | Status: DC

## 2015-03-16 NOTE — Telephone Encounter (Signed)
Can do 1-2 sprays per nostril depending on control of sx with med.  Sent.  Thanks.

## 2015-03-16 NOTE — Telephone Encounter (Signed)
Received a faxed refill request from pharmacy for Fluticasone. Request from pharmacy does not match the medication sheet. The request from pharfmacy shows use one spray in each nostril daily.  Please confirm directions?

## 2015-03-18 ENCOUNTER — Ambulatory Visit (INDEPENDENT_AMBULATORY_CARE_PROVIDER_SITE_OTHER): Payer: Medicare Other | Admitting: Neurology

## 2015-03-18 ENCOUNTER — Other Ambulatory Visit: Payer: Self-pay | Admitting: Family Medicine

## 2015-03-18 ENCOUNTER — Encounter: Payer: Self-pay | Admitting: Neurology

## 2015-03-18 VITALS — BP 139/89 | HR 77 | Wt 171.4 lb

## 2015-03-18 DIAGNOSIS — F1027 Alcohol dependence with alcohol-induced persisting dementia: Secondary | ICD-10-CM

## 2015-03-18 DIAGNOSIS — R413 Other amnesia: Secondary | ICD-10-CM

## 2015-03-18 DIAGNOSIS — I639 Cerebral infarction, unspecified: Secondary | ICD-10-CM | POA: Diagnosis not present

## 2015-03-18 DIAGNOSIS — M79602 Pain in left arm: Secondary | ICD-10-CM | POA: Diagnosis not present

## 2015-03-18 DIAGNOSIS — M79601 Pain in right arm: Secondary | ICD-10-CM

## 2015-03-18 NOTE — Telephone Encounter (Signed)
Dr Erlinda Hong will see pt today for FU and complete paperwork at that time.

## 2015-03-18 NOTE — Progress Notes (Signed)
STROKE NEUROLOGY FOLLOW UP NOTE  NAME: Aimee Jones DOB: 11/26/1945  REASON FOR VISIT: stroke follow up HISTORY FROM: husband and chart  Today we had the pleasure of seeing Aimee Jones in follow-up at our Neurology Clinic. Pt was accompanied by husband.   History Summary Aimee Jones is an 69 y.o. female with history significant for HLD, HTN, CKD, mild dementia, s/p right shoulder surgery 07/2014 was admitted for acute onset of right face droop, hands numbness, dysarthria. CT brain showed no acute abnormality. MRI showed bilateral punctate PCA/occipital infarcts, consistent with embolic pattern. Stroke workup including MRA, carotid Doppler, 2-D echo, lower extremity venous Doppler all negative. TEE was done showing small PFO, and loop recorder was placed. She was discharged with aspirin and Lipitor.  Follow up 12/11/14 - the patient has been doing stable. She can back to ED on 10/23/2014 for slurry speech right facial drooping and right tongue deviation. Symptoms resolved in ER. MRI did not show acute stroke. Considering TIA at that time. So far her loop recorder did not show any atrial per patient episode. She still has right shoulder pain on brace, and recently she started to have left shoulder pain, which she is going to undergo injections and possible MRI for a further evaluation with her orthopedic surgeon. She is also going to have sleep study to rule out sleep apnea ordered by her primary care doctor. Her blood pressure 116/72 in clinic today. She has been follow-up with Dr. Carles Collet in [place] [facility].  Interval History During the interval time, the patient has been doing well. Bilateral shoulder pain getting better but found out to have neck pain which may be the cause of bilateral shoulder pain. She is scheduled to have neck injection soon and would like to be cleared with stopping plavix for 5 days prior to procedure.   REVIEW OF SYSTEMS: Full 14 system review of systems  performed and notable only for those listed below and in HPI above, all others are negative:  Constitutional:   Cardiovascular:  Ear/Nose/Throat:   Skin:  Eyes:   Respiratory:   Gastroitestinal: Nausea Genitourinary:  Hematology/Lymphatic:  Easy bruising Endocrine: Feeling hot, feeling cold Musculoskeletal:  Joint pain, aching muscles, neck pain Allergy/Immunology:  Allergies Neurological:  Memory loss, headache Psychiatric: confusion, nervous/anxious Sleep: sleep talking, snoring  The following represents the patient's updated allergies and side effects list: Allergies  Allergen Reactions  . Latex Rash  . Penicillins Rash    The neurologically relevant items on the patient's problem list were reviewed on today's visit.  Neurologic Examination  A problem focused neurological exam (12 or more points of the single system neurologic examination, vital signs counts as 1 point, cranial nerves count for 8 points) was performed.  Blood pressure 139/89, pulse 77, weight 171 lb 6.4 oz (77.747 kg).  General - Well nourished, well developed, in no apparent distress.  Ophthalmologic - Sharp disc margins OU.  Cardiovascular - Regular rate and rhythm with no murmur.  Mental Status -  Level of arousal and orientation to time, place, and person were intact. Language including expression, naming, repetition, comprehension was assessed and found intact. Attention span and concentration were partially impaired, did not spell world right but able to calculate. Recent and remote memory were 3/3 registration, 0/3 delayed recall. Fund of Knowledge was assessed and was intact.  Cranial Nerves II - XII - II - Visual field intact OU. III, IV, VI - Extraocular movements intact. V - Facial sensation intact  bilaterally. VII - Facial movement intact bilaterally. VIII - Hearing & vestibular intact bilaterally. X - Palate elevates symmetrically. XI - Chin turning & shoulder shrug intact  bilaterally. XII - Tongue protrusion intact.  Motor Strength - The patient's strength was normal in all extremities.  Bulk was normal and fasciculations were absent.   Motor Tone - Muscle tone was assessed at the neck and appendages and was normal.  Reflexes - The patient's reflexes were normal in all extremities and she had no pathological reflexes.  Sensory - Light touch, temperature/pinprick were assessed and were normal.    Coordination - The patient had normal movements in the hands and feet with no ataxia or dysmetria.  Tremor was absent.  Gait and Station - The patient's transfers, posture, gait, station, and turns were observed as normal.  Data reviewed: I personally reviewed the images and agree with the radiology interpretations.  Ct Head Wo Contrast  10/12/2014 Stable atrophy and chronic small vessel ischemic change. No evidence of acute intracranial abnormality.   Mr Brain Wo Contrast  10/12/2014 Small areas of acute cortical infarct in the occipital lobe bilaterally, probable emboli. Atrophy and chronic microvascular ischemic changes are stable from the prior MRI   Mr [facility] Contrast  10/12/2014 Negative MRA head  Dg Chest 2 View  10/12/2014 Left base atelectasis. Elsewhere lungs clear.   CUS - Bilateral: 1-39% ICA stenosis. Vertebral artery flow is antegrade.  LE venous doppler - negative for DVT  UE venous doppler - negative for DVT  2D ehco - Left ventricle: The cavity size was normal. Wall thickness was increased in a pattern of mild LVH. Systolic function was normal. The estimated ejection fraction was in the range of 55% to 60%. Wall motion was normal; there were no regional wall motion abnormalities. Doppler parameters are consistent with abnormal left ventricular relaxation (grade 1 diastolic dysfunction). - Aortic valve: Valve area (VTI): 1.51 cm^2. Valve area (Vmax): 1.84 cm^2. Valve area (Vmean): 1.41  cm^2.  Impressions: - No cardiac source of emboli was indentified.  Loop recorder - no AF episodes  Component     Latest Ref Rng 10/13/2014  Cholesterol     0 - 200 mg/dL 142  Triglycerides     <150 mg/dL 180 (H)  HDL     >39 mg/dL 34 (L)  Total CHOL/HDL Ratio      4.2  VLDL     0 - 40 mg/dL 36  LDL (calc)     0 - 99 mg/dL 72  Hemoglobin A1C     <5.7 % 5.7 (H)  Mean Plasma Glucose     <117 mg/dL 117 (H)   Assessment: As you may recall, she is a 69 y.o. Caucasian female with PMH of HLD, HTN, CKD, mild dementia, s/p right shoulder surgery 07/2014 was admitted for bilateral punctate PCA/occipital infarcts, consistent with embolic pattern. Stroke workup including MRA, carotid Doppler, 2-D echo, LE DVT all negative. TEE was done showing small PFO, and loop recorder was placed and no AF episodes found so far. Had questionable TIA episode 10/2014, UE venoud doppler negative for DVT and ASA changed to plavix. She is not a good candidate for RESPECT ESUS trail, due to CKD, dementia, and frequent shoulder injections.   Plan:  - continue plavix and lipitor stroke prevention - continue to monitor loop recorder - Follow up with your primary care physician for stroke risk factor modification. Recommend maintain blood pressure goal <130/80, diabetes with hemoglobin A1c goal below  6.5% and lipids with LDL cholesterol goal below 70 mg/dL.  - check BP at home - frequent walking during long travel to avoid DVT - continue to follow up with Dr. Carles Collet in [facility] for memory difficulty - RTC in 6 months.   No orders of the defined types were placed in this encounter.    Meds ordered this encounter  Medications  . fentaNYL (DURAGESIC - DOSED MCG/HR) 25 MCG/HR patch    Sig:   . oxyCODONE (ROXICODONE) 15 MG immediate release tablet    Sig:     Refill:  0    Patient Instructions  - continue plavix and lipitor stroke prevention - continue to monitor loop recorder - Follow up with your  primary care physician for stroke risk factor modification. Recommend maintain blood pressure goal <130/80, diabetes with hemoglobin A1c goal below 6.5% and lipids with LDL cholesterol goal below 70 mg/dL.  - check BP at home - Follow up with pain management regarding the neck injection. We will fax the form for plavix management before the procedure. We recommend to bridge with ASA 81mg  for the 5 days without plavix. - frequent walking during long travel to avoid DVT - continue to follow up with Dr. Carles Collet in [place] for memory difficulty - follow up in 6 months.    Rosalin Hawking, MD PhD [facility] [address] [place], [place] [address] [phone]

## 2015-03-18 NOTE — Telephone Encounter (Signed)
Electronic refill request. Last Filled:    30 capsule 2 RF on 12/28/2014  Upcoming appt scheduled in early July.  Can I refill this or does it need to come to you?  Please advise.

## 2015-03-18 NOTE — Patient Instructions (Addendum)
-   continue plavix and lipitor stroke prevention - continue to monitor loop recorder - Follow up with your primary care physician for stroke risk factor modification. Recommend maintain blood pressure goal <130/80, diabetes with hemoglobin A1c goal below 6.5% and lipids with LDL cholesterol goal below 70 mg/dL.  - check BP at home - Follow up with pain management regarding the neck injection. We will fax the form for plavix management before the procedure. We recommend to bridge with ASA 81mg  for the 5 days without plavix. - frequent walking during long travel to avoid DVT - continue to follow up with Dr. Carles Collet in [place] for memory difficulty - follow up in 6 months.

## 2015-03-19 NOTE — Telephone Encounter (Signed)
Sent. Thanks.   

## 2015-03-22 ENCOUNTER — Telehealth: Payer: Self-pay | Admitting: Family Medicine

## 2015-03-22 ENCOUNTER — Other Ambulatory Visit (INDEPENDENT_AMBULATORY_CARE_PROVIDER_SITE_OTHER): Payer: Medicare Other

## 2015-03-22 ENCOUNTER — Ambulatory Visit (INDEPENDENT_AMBULATORY_CARE_PROVIDER_SITE_OTHER): Payer: Medicare Other | Admitting: Neurology

## 2015-03-22 ENCOUNTER — Encounter: Payer: Self-pay | Admitting: Neurology

## 2015-03-22 VITALS — BP 124/84 | HR 88 | Ht 67.0 in | Wt 169.0 lb

## 2015-03-22 DIAGNOSIS — G4440 Drug-induced headache, not elsewhere classified, not intractable: Secondary | ICD-10-CM | POA: Diagnosis not present

## 2015-03-22 DIAGNOSIS — I639 Cerebral infarction, unspecified: Secondary | ICD-10-CM

## 2015-03-22 DIAGNOSIS — F1027 Alcohol dependence with alcohol-induced persisting dementia: Secondary | ICD-10-CM

## 2015-03-22 DIAGNOSIS — G9340 Encephalopathy, unspecified: Secondary | ICD-10-CM

## 2015-03-22 DIAGNOSIS — N289 Disorder of kidney and ureter, unspecified: Secondary | ICD-10-CM | POA: Diagnosis not present

## 2015-03-22 LAB — CBC WITH DIFFERENTIAL/PLATELET
BASOS ABS: 0.1 10*3/uL (ref 0.0–0.1)
BASOS PCT: 0.7 % (ref 0.0–3.0)
Eosinophils Absolute: 0.7 10*3/uL (ref 0.0–0.7)
Eosinophils Relative: 5.1 % — ABNORMAL HIGH (ref 0.0–5.0)
HCT: 38.3 % (ref 36.0–46.0)
HEMOGLOBIN: 12.1 g/dL (ref 12.0–15.0)
LYMPHS ABS: 2.6 10*3/uL (ref 0.7–4.0)
LYMPHS PCT: 18 % (ref 12.0–46.0)
MCHC: 31.5 g/dL (ref 30.0–36.0)
MCV: 81.8 fl (ref 78.0–100.0)
MONO ABS: 0.7 10*3/uL (ref 0.1–1.0)
Monocytes Relative: 5 % (ref 3.0–12.0)
Neutro Abs: 10.3 10*3/uL — ABNORMAL HIGH (ref 1.4–7.7)
Neutrophils Relative %: 71.2 % (ref 43.0–77.0)
Platelets: 622 10*3/uL — ABNORMAL HIGH (ref 150.0–400.0)
RBC: 4.68 Mil/uL (ref 3.87–5.11)
RDW: 17.4 % — AB (ref 11.5–15.5)
WBC: 14.5 10*3/uL — AB (ref 4.0–10.5)

## 2015-03-22 LAB — COMPREHENSIVE METABOLIC PANEL
ALBUMIN: 4 g/dL (ref 3.5–5.2)
ALT: 14 U/L (ref 0–35)
AST: 21 U/L (ref 0–37)
Alkaline Phosphatase: 72 U/L (ref 39–117)
BUN: 18 mg/dL (ref 6–23)
CALCIUM: 9.6 mg/dL (ref 8.4–10.5)
CHLORIDE: 104 meq/L (ref 96–112)
CO2: 26 meq/L (ref 19–32)
CREATININE: 1.05 mg/dL (ref 0.40–1.20)
GFR: 55.08 mL/min — AB (ref >60.00)
GLUCOSE: 85 mg/dL (ref 70–99)
Potassium: 4.3 mEq/L (ref 3.5–5.1)
Sodium: 134 mEq/L — ABNORMAL LOW (ref 135–145)
TOTAL PROTEIN: 7 g/dL (ref 6.0–8.3)
Total Bilirubin: 0.2 mg/dL (ref 0.2–1.2)

## 2015-03-22 LAB — URINALYSIS, ROUTINE W REFLEX MICROSCOPIC
BILIRUBIN URINE: NEGATIVE
Hgb urine dipstick: NEGATIVE
KETONES UR: NEGATIVE
Nitrite: NEGATIVE
PH: 5.5 (ref 5.0–8.0)
Specific Gravity, Urine: 1.025 (ref 1.000–1.030)
Total Protein, Urine: NEGATIVE
UROBILINOGEN UA: 0.2 (ref 0.0–1.0)
Urine Glucose: NEGATIVE

## 2015-03-22 NOTE — Patient Instructions (Signed)
1. Your provider has requested that you have labwork completed today. Please go to [facility] on the second floor of this building before leaving the office today. You do not need to check in. If you are not called within 15 minutes please check with the front desk.   

## 2015-03-22 NOTE — Telephone Encounter (Signed)
Placed in Dr. Duncan's In Box for signature. 

## 2015-03-22 NOTE — Progress Notes (Signed)
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  The patient is accompanied by her husband who supplements the history.  The patient reports that she first noted some tremor in October at her PE when her L arm was lifted above the head.  She denies tremor otherwise but her husband noted tremor for a year when she went to pick up coffee.  She has been on abilify for depression for a few years.  She has not been on other atypical agents.  04/15/2013 update:  I did have the opportunity to review records since she was last here.  The patient needs a shoulder surgery and has been seen by the orthopedic physician, but he did not want to do the surgery until the patient discontinued her alcohol use.  She did end up having her surgery done on June 11.  She is now in physical therapy.  The patient does have a history of alcohol abuse.  She had stopped drinking prior to her orthopedic shoulder surgery on the left, but her husband states that she has started drinking again.  He states that her balance got markedly better when she stopped drinking.  The patient states that she is only drinking one drink per day now.  The patient's Abilify was discontinued last November, and her husband noticed an improvement in tremor.  05/22/14 update: Pt has a hx of parkinsonism, that had resolved off of abilify.  She has not been seen in over a year.  At last visit, I had noted memory loss and recommended that she not drive unless she have and pass a medical driving evaluation.  Her husband states that memory has dramatically deteriorated.  She came to her husband recently with a wrapper around cheese and tried to put it around candy and was just confused.  On her way in today, she was bending over in the parking lot picking up trash below cars.  He tried to get her into a [facility] alzheimers study but she was turned down because of past EtOH abuse and  narcotic meds.  She is still drinking but her husband states that he controls it completely.  He gives her 4 oz per day of liquor.  He states that he just isn't sure that he wants to "deny her a little pleasure."  No falls since her husband took over distrubution of the EtOH.   Rare visual and auditory hallucinations.  Last driving was 3 weeks ago but her husband doesn't plan on having her drive any longer.  She wears depends for 3 years.  Her husband has a wood working shop next to the house but he is starting to think that he needs to turn off the breaker to the stove when he leaves.  She takes 2 xanax at night for sleep.  She has doxepin but doesn't take it.  She does take 4 ibuprofen in the AM and 2 tylenol at night for shoulder pain and one time per week will take 7.5 mg of hydrocodone.  No longer on thiamine.    03/22/15 update:  The patient returns today for follow-up.  I have not seen her since August of last year.  She did have neuropsych testing last September, 2015 and Dr. 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  She is still drinking 4 nights a week (husband prepares it and mixes liquor with water).  She did have right shoulder  replacement since last visit and she was doing well but now the sx's are coming back.  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  She is to have an injection next week for that pain.  She was hospitalized in December, 2015.  I reviewed his records.  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  She had an echocardiogram that did not reveal any source of emboli.  Her ejection fraction was 55-60%.  She had a normal carotid ultrasound.  Lower extremity Dopplers were negative.  She underwent a TEE on 10/14/2014 and a small PFO was identified.  She had a loop recorder implanted that same day.  She has followed up several times with  [facility] neurology.  Dr. Jannifer Franklin did an EMG on the patient.  She just saw [facility] neurology a few days ago.  Her husband, however, states that she was told to follow-up here.  She is on Aricept for her memory loss. Her husband states that he has always given it to her in the AM(been on it since at least October last year) but about 3 weeks ago he changed it to q hs as he realized that the bottle said it was to be given at night.  However, since that time she seems a bit more confused.  Her husband also states that pt c/o low lying daily headache.  He asks if it is related to the fact that she is taking oxycodone 3-4 times per day.  Headache is holocephalic.    She did have an MRI of the brain on 09/16/2012.  There was atrophy and moderate small vessel disease.  I did review this.  The formal report is below:   *RADIOLOGY REPORT*   Clinical Data: Memory loss.  Parkinsonism. History of hypertension and glucose tolerance.  History of alcohol abuse.   MRI HEAD WITHOUT CONTRAST   Technique:  Multiplanar, multiecho pulse sequences of the brain and surrounding structures were obtained according to standard protocol without intravenous contrast.   Comparison: 06/28/2010 CT.   Findings: There is no evidence for acute infarction, intracranial hemorrhage, mass lesion, hydrocephalus, or extra-axial fluid. Moderate atrophy is present, with particular prominence of the sylvian fissures.   Extensive chronic microvascular ischemic change affects the periventricular and subcortical white matter as well as the pons.  No foci of chronic hemorrhage. Small remote right cerebellar infarct.  Intact calvarium and skull base.  Normal pituitary and cerebellar tonsils.  Unremarkable cervical region. Major intracranial vascular structures patent.  Mild chronic sinus disease.  Negative orbits and mastoids.   IMPRESSION: Moderate atrophy and extensive chronic microvascular ischemic change. Prominence of the   sylvian fissures could suggest disproportionate temporal lobe volume loss.  No acute intracranial abnormality.    PREVIOUS MEDICATIONS: none to date   ALLERGIES:   Allergies  Allergen Reactions  . Latex Rash  . Penicillins Rash    CURRENT MEDICATIONS:  Current Outpatient Prescriptions on File Prior to Visit  Medication Sig Dispense Refill  . ALPRAZolam (XANAX) 0.5 MG tablet TAKE 1 TABLET BY MOUTH 3 TIMES A DAY AS NEEDED 90 tablet 1  . atorvastatin (LIPITOR) 10 MG tablet Take 10 mg by mouth daily at 6 PM.     . B Complex-C (SUPER B COMPLEX PO) Take 150 mg by mouth daily.     . budesonide-formoterol (SYMBICORT) 160-4.5 MCG/ACT inhaler Inhale 2 puffs into the  lungs 2 (two) times daily as needed (shortness of breath). 1 Inhaler 12  . citalopram (CELEXA) 40 MG tablet Take 40 mg by mouth at bedtime.     . clopidogrel (PLAVIX) 75 MG tablet Take 1 tablet (75 mg total) by mouth daily. 90 tablet 3  . donepezil (ARICEPT) 10 MG tablet TAKE 1 TABLET BY MOUTH DAILY AT BEDTIME 90 tablet 1  . fentaNYL (DURAGESIC - DOSED MCG/HR) 25 MCG/HR patch     . fexofenadine (ALLEGRA) 180 MG tablet Take 180 mg by mouth daily before breakfast.     . fish oil-omega-3 fatty acids 1000 MG capsule Take 1 g by mouth daily.     . fluticasone (FLONASE) 50 MCG/ACT nasal spray Place 1-2 sprays into both nostrils daily. 16 g 6  . guaiFENesin (MUCINEX) 600 MG 12 hr tablet Take 2 tablets (1,200 mg total) by mouth 2 (two) times daily. 14 tablet 0  . montelukast (SINGULAIR) 10 MG tablet Take 10 mg by mouth at bedtime.     . Multiple Vitamins-Minerals (MULTIVITAMIN PO) Take 1 tablet by mouth daily.    . nabumetone (RELAFEN) 750 MG tablet TAKE 1 TABLET BY MOUTH 2 TIMES DAILY AS NEEDED FOR PAIN. 180 tablet 0  . olmesartan-hydrochlorothiazide (BENICAR HCT) 40-25 MG per tablet Take 0.5 tablets by mouth at bedtime.    Marland Kitchen omeprazole (PRILOSEC) 20 MG capsule 1 capsule twice daily for 10 days, then decrease to normal once daily dosing  40 capsule 0  . oxyCODONE (ROXICODONE) 15 MG immediate release tablet   0  . promethazine (PHENERGAN) 25 MG tablet TAKE 1 TABLET BY MOUTH EVERY 6 HOURS AS NEEDED FOR NAUSEA 20 tablet 0  . thiamine (VITAMIN B-1) 100 MG tablet Take 100 mg by mouth daily.    Marland Kitchen VITAMIN E PO Take 1 capsule by mouth daily.     Marland Kitchen doxepin (SINEQUAN) 25 MG capsule TAKE 1 CAPSULE BY MOUTH DAILY AT BEDTIME (Patient not taking: Reported on 03/22/2015) 30 capsule 1  . oxyCODONE-acetaminophen (PERCOCET) 5-325 MG per tablet Take 1 tablet by mouth every 4 (four) hours as needed for severe pain. (Patient not taking: Reported on 03/18/2015) 60 tablet 0   No current facility-administered medications on file prior to visit.    PAST MEDICAL HISTORY:   Past Medical History  Diagnosis Date  . Hyperlipidemia   . ETOH abuse   . Allergy   . Diverticulosis   . Hypertension   . Depression   . Asthma   . GERD (gastroesophageal reflux disease)   . Normal 24 hour ambulatory pH monitoring study     good acid suppression  . Dementia   . Shortness of breath   . Anxiety     panic attacks- on occas.   . Arthritis     osteoporosis - especially hips.   . Stroke   . Memory loss   . Headache   . Confusion     PAST SURGICAL HISTORY:   Past Surgical History  Procedure Laterality Date  . Dobutamine stress echo  07/09/2001    normal  . Esophagogastroduodenoscopy  06/11/2006    normal  . Abdominal hysterectomy    . Total hip arthroplasty      left 1991/right 2008  . Nasal sinus surgery  1990  . Eye surgery      cataracts removed, IOL- both eyes   . Breast surgery      3x- biposies   . Shoulder arthroscopy Left   . Reverse shoulder arthroplasty  Right 08/13/2014    Procedure: REVERSE SHOULDER ARTHROPLASTY;  Surgeon: Marin Shutter, MD;  Location: [place];  Service: Orthopedics;  Laterality: Right;  interscalene block  . Joint replacement      bilateral hips  . Loop recorder implant N/A 10/14/2014    Procedure: LOOP RECORDER  IMPLANT;  Surgeon: Evans Lance, MD;  Location: [facility] CATH LAB;  Service: Cardiovascular;  Laterality: N/A;  . Tee without cardioversion N/A 10/14/2014    Procedure: TRANSESOPHAGEAL ECHOCARDIOGRAM (TEE);  Surgeon: Sueanne Margarita, MD;  Location: [facility] ENDOSCOPY;  Service: Cardiovascular;  Laterality: N/A;    SOCIAL HISTORY:   History   Social History  . Marital Status: Married    Spouse Name: N/A  . Number of Children: N/A  . Years of Education: N/A   Occupational History  . retired     [employer]   Social History Main Topics  . Smoking status: Never Smoker   . Smokeless tobacco: Never Used  . Alcohol Use: 0.0 oz/week    0 Standard drinks or equivalent per week     Comment: grand Mariener - almost daily- 1/2 shot  . Drug Use: No  . Sexual Activity: Yes   Other Topics Concern  . Not on file   Social History Narrative   Married. Retired.      FAMILY HISTORY:   Family Status  Relation Status Death Age  . Mother Deceased     CAD, PPM  . Father Deceased     CAD  . Sister Alive     3, healthy  . Brother Alive     2, CAD, esophageal CA  . Child Alive     healthy    ROS:  A complete 10 system review of systems was obtained and was unremarkable apart from what is mentioned above.  PHYSICAL EXAMINATION:    VITALS:   Filed Vitals:   03/22/15 1308  BP: 124/84  Pulse: 88  Height: 5\' 7"  (1.702 m)  Weight: 169 lb (76.658 kg)    GEN:  The patient appears stated age and is in NAD. HEENT:  Normocephalic, atraumatic.  The mucous membranes are moist. The superficial temporal arteries are without ropiness or tenderness. CV:  RRR Lungs:  CTAB Neck/HEME:  There are no carotid bruits bilaterally.  Neurological examination:  Orientation: Scores 3/4 on clock drawing.  She says that it is May, 2016.  Previous MoCA scores were 7 and 17.   Cranial nerves: There is good facial symmetry. Pupils are equal round and reactive to light bilaterally. Fundoscopic exam reveals clear  margins bilaterally. Extraocular muscles are intact. The visual fields are full to confrontational testing. The speech is fluent and clear. Soft palate rises symmetrically and there is no tongue deviation. Hearing is intact to conversational tone. Sensation: Sensation is intact to light touch throughout. Motor: Strength is 5/5 in the bilateral upper and lower extremities.   Shoulder shrug is equal and symmetric.  There is no pronator drift. Deep tendon reflexes: Deep tendon reflexes are 2/4 at the bilateral biceps, triceps, brachioradialis, patella and achilles. Plantar responses are downgoing bilaterally. Frontal release:  There is a bilateral palmomental response  Movement examination: Tone: There is normal tone in the bilateral upper extremities.  The tone in the lower extremities is normal.  Abnormal movements: There was mild chin tremor.  There was mild hand tremor.    No dyskinesia.  No asterixis. Coordination:  There is no difficulty with rapid alternating movements today. Gait and  Station: The patient has no difficulty arising out of a deep-seated chair without the use of the hands. The patient's stride length is normal. Arm swing is good.  She walks tandem gait today well.     LABS:  Lab Results  Component Value Date   VITAMINB12 763 05/22/2014   Lab Results  Component Value Date   FOLATE >20.0 05/22/2014   No results found for: RPR   Chemistry      Component Value Date/Time   NA 132* 10/23/2014 1212   K 4.2 10/23/2014 1212   CL 102 10/23/2014 1212   CO2 19 10/23/2014 1212   BUN 23 10/23/2014 1212   CREATININE 1.35* 10/23/2014 1212   CREATININE 1.18* 05/22/2014 1211      Component Value Date/Time   CALCIUM 9.8 10/23/2014 1212   ALKPHOS 55 10/23/2014 1212   AST 25 10/23/2014 1212   ALT 19 10/23/2014 1212   BILITOT 0.7 10/23/2014 1212     Lab Results  Component Value Date   WBC 13.4* 10/23/2014   HGB 12.4 10/23/2014   HCT 38.8 10/23/2014   MCV 78.5 10/23/2014    PLT 563* 10/23/2014     ASSESSMENT/PLAN  1.  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  I do not think that she has idiopathic Parkinson's disease.  -This has resolved off of the Abilify.  Does have some chin tremor and hand tremor but still no evidence of PD.  Husband states that daughter asks about Lewy Body dementia but I explained that she does not have that. 2.  EtOH dementia  -This continues to deteriorate.  She may have a component of Wernicke's encephalopathy, but I don't think so.  It is my recommendation that alcohol be discontinued altogether and discussed again today.  Much greater than 50% of this 45 minute visit was spent in counseling.  She is also on several medications that can interfere with cognitive function, such as Xanax, oxycodone (4 times a day), fentanyl and doxepin.  -Change aricept back to the AM.  Discussed namenda but didn't want to add until see how she does with changing timing of aricept (since felt worse lately)  -She is not to be driving.  She is not to be left alone.  Safety in the home was discussed. 3.  Rebound headache  -likely from the narcotic medication usage.  Takes oxycodone 3-4 times daily.   4.  Renal insufficiency  -Last checked in Jan.  I need to recheck that today, along with CBC, chem, UA 5.  Return in about 5 months (around 08/22/2015).  Will do MoCA at that visit.

## 2015-03-22 NOTE — Telephone Encounter (Signed)
Pt dropped off Handicapped placard.  Best number to call when complete is [phone].  Placed in LF inbox / lt

## 2015-03-22 NOTE — Telephone Encounter (Signed)
I'll address the hard copy next week.  Out of town this week.

## 2015-03-23 ENCOUNTER — Ambulatory Visit (INDEPENDENT_AMBULATORY_CARE_PROVIDER_SITE_OTHER): Payer: Medicare Other | Admitting: Primary Care

## 2015-03-23 ENCOUNTER — Encounter: Payer: Self-pay | Admitting: Primary Care

## 2015-03-23 ENCOUNTER — Telehealth: Payer: Self-pay | Admitting: Neurology

## 2015-03-23 VITALS — BP 128/78 | HR 77 | Temp 97.9°F | Ht 67.0 in | Wt 172.0 lb

## 2015-03-23 DIAGNOSIS — J3081 Allergic rhinitis due to animal (cat) (dog) hair and dander: Secondary | ICD-10-CM | POA: Diagnosis not present

## 2015-03-23 DIAGNOSIS — I639 Cerebral infarction, unspecified: Secondary | ICD-10-CM

## 2015-03-23 DIAGNOSIS — R350 Frequency of micturition: Secondary | ICD-10-CM | POA: Diagnosis not present

## 2015-03-23 DIAGNOSIS — J3089 Other allergic rhinitis: Secondary | ICD-10-CM | POA: Diagnosis not present

## 2015-03-23 LAB — POCT URINALYSIS DIPSTICK
BILIRUBIN UA: NEGATIVE
GLUCOSE UA: NEGATIVE
Ketones, UA: NEGATIVE
Nitrite, UA: NEGATIVE
PROTEIN UA: NEGATIVE
RBC UA: NEGATIVE
SPEC GRAV UA: 1.02
UROBILINOGEN UA: NEGATIVE
pH, UA: 6

## 2015-03-23 LAB — CUP PACEART REMOTE DEVICE CHECK: Date Time Interrogation Session: 20160607105809

## 2015-03-23 MED ORDER — SULFAMETHOXAZOLE-TRIMETHOPRIM 800-160 MG PO TABS: 1.0000 | ORAL_TABLET | Freq: Two times a day (BID) | ORAL | Status: DC

## 2015-03-23 NOTE — Patient Instructions (Addendum)
Your urine shows some bacteria. Start Bactrim antibiotics. Take 1 tablet by mouth twice daily for 3 days. Your blood counts are the same as prior blood tests which is not concerning at this point. Please notify us if you develop symptoms of extreme weakness, dizziness, chest pain, fevers, chills, or shortness of breath. Follow up with Dr. [name] in July as scheduled. It was nice meeting you!

## 2015-03-23 NOTE — Telephone Encounter (Signed)
-----   Message from [place], DO sent at 03/23/2015  7:39 AM EDT ----- Luvenia Starch, please let pts husb know that her WBC count and platelets remain high (have been that way for a while).  Her PCP may have already looked into etiology but if not (cannot find note that mentions), needs eval with PCP for this.

## 2015-03-23 NOTE — Progress Notes (Signed)
Subjective:    Patient ID: Aimee Jones, female    DOB: October 20, 1945, 69 y.o.   MRN: [id number]  HPI  Aimee Jones is a 69 year old female who presents today for follow up of abnormal labs that were drawn from Aimee Jones with neurology. She has a history of Dementia and has difficulty with memory and is a poor historian. She has recently been reporting difficulty urinating with frequency and urgency. Labs reveal an elevated WBC count and platelet count which have both been high historically. Denies fevers, chills, flank pain. They also request the signature for a handicap placard.  Review of Systems  Constitutional: Negative for fever and chills.  HENT: Negative for congestion and sore throat.   Respiratory: Negative for cough and shortness of breath.   Cardiovascular: Negative for chest pain.  Gastrointestinal: Negative for nausea and vomiting.  Genitourinary: Positive for urgency, frequency and difficulty urinating. Negative for dysuria.       Past Medical History  Diagnosis Date  . Hyperlipidemia   . ETOH abuse   . Allergy   . Diverticulosis   . Hypertension   . Depression   . Asthma   . GERD (gastroesophageal reflux disease)   . Normal 24 hour ambulatory pH monitoring study     good acid suppression  . Dementia   . Shortness of breath   . Anxiety     panic attacks- on occas.   . Arthritis     osteoporosis - especially hips.   . Stroke   . Memory loss   . Headache   . Confusion     History   Social History  . Marital Status: Married    Spouse Name: N/A  . Number of Children: N/A  . Years of Education: N/A   Occupational History  . retired     [employer]   Social History Main Topics  . Smoking status: Never Smoker   . Smokeless tobacco: Never Used  . Alcohol Use: 0.0 oz/week    0 Standard drinks or equivalent per week     Comment: grand Mariener - almost daily- 1/2 shot  . Drug Use: No  . Sexual Activity: Yes   Other Topics Concern  . Not on file    Social History Narrative   Married. Retired.      Past Surgical History  Procedure Laterality Date  . Dobutamine stress echo  07/09/2001    normal  . Esophagogastroduodenoscopy  06/11/2006    normal  . Abdominal hysterectomy    . Total hip arthroplasty      left 1991/right 2008  . Nasal sinus surgery  1990  . Eye surgery      cataracts removed, IOL- both eyes   . Breast surgery      3x- biposies   . Shoulder arthroscopy Left   . Reverse shoulder arthroplasty Right 08/13/2014    Procedure: REVERSE SHOULDER ARTHROPLASTY;  Surgeon: Marin Shutter, MD;  Location: [place];  Service: Orthopedics;  Laterality: Right;  interscalene block  . Joint replacement      bilateral hips  . Loop recorder implant N/A 10/14/2014    Procedure: LOOP RECORDER IMPLANT;  Surgeon: Evans Lance, MD;  Location: [facility] CATH LAB;  Service: Cardiovascular;  Laterality: N/A;  . Tee without cardioversion N/A 10/14/2014    Procedure: TRANSESOPHAGEAL ECHOCARDIOGRAM (TEE);  Surgeon: Sueanne Margarita, MD;  Location: [facility] ENDOSCOPY;  Service: Cardiovascular;  Laterality: N/A;    Family History  Problem  Relation Age of Onset  . Sudden death Brother   . Heart attack Brother   . Hypertension Brother   . Esophageal cancer Brother   . Heart attack Father   . Heart block Mother   . Stroke Brother   . Stroke Mother     Allergies  Allergen Reactions  . Latex Rash  . Penicillins Rash    Current Outpatient Prescriptions on File Prior to Visit  Medication Sig Dispense Refill  . ALPRAZolam (XANAX) 0.5 MG tablet TAKE 1 TABLET BY MOUTH 3 TIMES A DAY AS NEEDED 90 tablet 1  . Aspirin-Salicylamide-Caffeine (BC HEADACHE POWDER PO) Take 2 Doses by mouth daily.    Marland Kitchen atorvastatin (LIPITOR) 10 MG tablet Take 10 mg by mouth daily at 6 PM.     . B Complex-C (SUPER B COMPLEX PO) Take 150 mg by mouth daily.     . budesonide-formoterol (SYMBICORT) 160-4.5 MCG/ACT inhaler Inhale 2 puffs into the lungs 2 (two) times daily as needed  (shortness of breath). 1 Inhaler 12  . citalopram (CELEXA) 40 MG tablet Take 40 mg by mouth at bedtime.     . clopidogrel (PLAVIX) 75 MG tablet Take 1 tablet (75 mg total) by mouth daily. 90 tablet 3  . donepezil (ARICEPT) 10 MG tablet TAKE 1 TABLET BY MOUTH DAILY AT BEDTIME 90 tablet 1  . doxepin (SINEQUAN) 25 MG capsule TAKE 1 CAPSULE BY MOUTH DAILY AT BEDTIME 30 capsule 1  . fentaNYL (DURAGESIC - DOSED MCG/HR) 25 MCG/HR patch     . fexofenadine (ALLEGRA) 180 MG tablet Take 180 mg by mouth daily before breakfast.     . fish oil-omega-3 fatty acids 1000 MG capsule Take 1 g by mouth daily.     . fluticasone (FLONASE) 50 MCG/ACT nasal spray Place 1-2 sprays into both nostrils daily. 16 g 6  . guaiFENesin (MUCINEX) 600 MG 12 hr tablet Take 2 tablets (1,200 mg total) by mouth 2 (two) times daily. 14 tablet 0  . montelukast (SINGULAIR) 10 MG tablet Take 10 mg by mouth at bedtime.     . Multiple Vitamins-Minerals (MULTIVITAMIN PO) Take 1 tablet by mouth daily.    . nabumetone (RELAFEN) 750 MG tablet TAKE 1 TABLET BY MOUTH 2 TIMES DAILY AS NEEDED FOR PAIN. 180 tablet 0  . olmesartan-hydrochlorothiazide (BENICAR HCT) 40-25 MG per tablet Take 0.5 tablets by mouth at bedtime.    Marland Kitchen omeprazole (PRILOSEC) 20 MG capsule 1 capsule twice daily for 10 days, then decrease to normal once daily dosing 40 capsule 0  . oxyCODONE (ROXICODONE) 15 MG immediate release tablet   0  . oxyCODONE-acetaminophen (PERCOCET) 5-325 MG per tablet Take 1 tablet by mouth every 4 (four) hours as needed for severe pain. 60 tablet 0  . promethazine (PHENERGAN) 25 MG tablet TAKE 1 TABLET BY MOUTH EVERY 6 HOURS AS NEEDED FOR NAUSEA 20 tablet 0  . thiamine (VITAMIN B-1) 100 MG tablet Take 100 mg by mouth daily.    Marland Kitchen VITAMIN E PO Take 1 capsule by mouth daily.      No current facility-administered medications on file prior to visit.    BP 128/78 mmHg  Pulse 77  Temp(Src) 97.9 F (36.6 C) (Oral)  Ht 5\' 7"  (1.702 m)  Wt 172 lb  (78.019 kg)  BMI 26.93 kg/m2  SpO2 94%    Objective:   Physical Exam  Constitutional: She appears well-nourished. She does not appear ill.  Cardiovascular: Normal rate and regular rhythm.   Pulmonary/Chest:  Effort normal and breath sounds normal.  Abdominal: Soft. Bowel sounds are normal. There is no tenderness. There is no CVA tenderness.  Neurological: She is alert.  Skin: Skin is warm and dry.          Assessment & Plan:  Urinary frequency:  UA: Positive for leuks. Negative for nitrites, blood, protein. No flank pain, abdominal pain, or dysuria. Due to age, co-morbidities, and dementia will treat. RX for Bactrim DS BID x 3 days. WBC and Platelets have historically been elevated and do not seem different form norm. Follow up if chills, fevers, weakness, dizziness, change in mental status.

## 2015-03-23 NOTE — Telephone Encounter (Signed)
Patient's husband made aware of lab results. They will make a follow up with Dr [name] to look into this. Labs available in [facility] for PCP.

## 2015-03-23 NOTE — Progress Notes (Signed)
Pre visit review using our clinic review tool, if applicable. No additional management support is needed unless otherwise documented below in the visit note. 

## 2015-03-23 NOTE — Addendum Note (Signed)
Addended by: Jacqualin Combes on: 03/23/2015 02:45 PM   Modules accepted: Orders

## 2015-03-23 NOTE — Progress Notes (Signed)
Loop recorder 

## 2015-03-26 LAB — URINE CULTURE: Colony Count: >=100000

## 2015-03-30 DIAGNOSIS — J3081 Allergic rhinitis due to animal (cat) (dog) hair and dander: Secondary | ICD-10-CM | POA: Diagnosis not present

## 2015-03-30 DIAGNOSIS — J3089 Other allergic rhinitis: Secondary | ICD-10-CM | POA: Diagnosis not present

## 2015-04-05 ENCOUNTER — Other Ambulatory Visit: Payer: Self-pay | Admitting: Family Medicine

## 2015-04-05 ENCOUNTER — Encounter: Payer: Self-pay | Admitting: Internal Medicine

## 2015-04-05 DIAGNOSIS — M47812 Spondylosis without myelopathy or radiculopathy, cervical region: Secondary | ICD-10-CM | POA: Diagnosis not present

## 2015-04-05 DIAGNOSIS — M25519 Pain in unspecified shoulder: Secondary | ICD-10-CM | POA: Diagnosis not present

## 2015-04-05 DIAGNOSIS — M5030 Other cervical disc degeneration, unspecified cervical region: Secondary | ICD-10-CM | POA: Diagnosis not present

## 2015-04-05 DIAGNOSIS — M542 Cervicalgia: Secondary | ICD-10-CM | POA: Diagnosis not present

## 2015-04-05 NOTE — Telephone Encounter (Signed)
Electronic refill request. Last Filled:    90 tablet 1 RF on 02/28/2015  Please advise.

## 2015-04-06 ENCOUNTER — Other Ambulatory Visit: Payer: Self-pay | Admitting: Family Medicine

## 2015-04-06 DIAGNOSIS — J3089 Other allergic rhinitis: Secondary | ICD-10-CM | POA: Diagnosis not present

## 2015-04-06 DIAGNOSIS — J3081 Allergic rhinitis due to animal (cat) (dog) hair and dander: Secondary | ICD-10-CM | POA: Diagnosis not present

## 2015-04-06 NOTE — Telephone Encounter (Signed)
Too early.   Should have a refill left.

## 2015-04-07 ENCOUNTER — Other Ambulatory Visit: Payer: Self-pay | Admitting: *Deleted

## 2015-04-07 DIAGNOSIS — I639 Cerebral infarction, unspecified: Secondary | ICD-10-CM

## 2015-04-07 MED ORDER — CITALOPRAM HYDROBROMIDE 40 MG PO TABS: 40.0000 mg | ORAL_TABLET | Freq: Every day | ORAL | Status: DC

## 2015-04-07 NOTE — Telephone Encounter (Signed)
Sent. Thanks.   

## 2015-04-07 NOTE — Telephone Encounter (Signed)
Received faxed refill request from pharmacy. Last refill 12/11/14 Is it okay to refill medication? Last office visit 10/19/14

## 2015-04-09 ENCOUNTER — Other Ambulatory Visit: Payer: Self-pay | Admitting: Family Medicine

## 2015-04-09 NOTE — Telephone Encounter (Signed)
Electronic refill request. Last Filled:    20 tablet 0 RF on 03/05/2015  Please advise.

## 2015-04-10 ENCOUNTER — Other Ambulatory Visit: Payer: Self-pay | Admitting: Family Medicine

## 2015-04-10 DIAGNOSIS — I10 Essential (primary) hypertension: Secondary | ICD-10-CM

## 2015-04-10 DIAGNOSIS — D72829 Elevated white blood cell count, unspecified: Secondary | ICD-10-CM

## 2015-04-10 NOTE — Telephone Encounter (Signed)
Sent. Thanks.   

## 2015-04-13 ENCOUNTER — Other Ambulatory Visit: Payer: Self-pay | Admitting: Family Medicine

## 2015-04-13 ENCOUNTER — Ambulatory Visit (INDEPENDENT_AMBULATORY_CARE_PROVIDER_SITE_OTHER): Payer: Medicare Other | Admitting: *Deleted

## 2015-04-13 DIAGNOSIS — I6322 Cerebral infarction due to unspecified occlusion or stenosis of basilar arteries: Secondary | ICD-10-CM

## 2015-04-13 DIAGNOSIS — I639 Cerebral infarction, unspecified: Secondary | ICD-10-CM | POA: Diagnosis not present

## 2015-04-13 DIAGNOSIS — J3089 Other allergic rhinitis: Secondary | ICD-10-CM | POA: Diagnosis not present

## 2015-04-13 NOTE — Telephone Encounter (Signed)
Electronic refill request. Last Filled:    90 tablet 1 RF 02/28/2015

## 2015-04-14 LAB — CUP PACEART REMOTE DEVICE CHECK: MDC IDC SESS DTM: 20160629142836

## 2015-04-14 NOTE — Progress Notes (Signed)
Loop recorder 

## 2015-04-15 ENCOUNTER — Other Ambulatory Visit (INDEPENDENT_AMBULATORY_CARE_PROVIDER_SITE_OTHER): Payer: Medicare Other

## 2015-04-15 DIAGNOSIS — D72829 Elevated white blood cell count, unspecified: Secondary | ICD-10-CM | POA: Diagnosis not present

## 2015-04-15 DIAGNOSIS — M5030 Other cervical disc degeneration, unspecified cervical region: Secondary | ICD-10-CM | POA: Diagnosis not present

## 2015-04-15 DIAGNOSIS — M47812 Spondylosis without myelopathy or radiculopathy, cervical region: Secondary | ICD-10-CM | POA: Diagnosis not present

## 2015-04-15 DIAGNOSIS — I10 Essential (primary) hypertension: Secondary | ICD-10-CM

## 2015-04-15 DIAGNOSIS — M25519 Pain in unspecified shoulder: Secondary | ICD-10-CM | POA: Diagnosis not present

## 2015-04-15 DIAGNOSIS — M542 Cervicalgia: Secondary | ICD-10-CM | POA: Diagnosis not present

## 2015-04-15 DIAGNOSIS — G894 Chronic pain syndrome: Secondary | ICD-10-CM | POA: Diagnosis not present

## 2015-04-15 LAB — CBC WITH DIFFERENTIAL/PLATELET
BASOS ABS: 0.1 10*3/uL (ref 0.0–0.1)
Basophils Relative: 0.5 % (ref 0.0–3.0)
Eosinophils Absolute: 0.6 10*3/uL (ref 0.0–0.7)
Eosinophils Relative: 4.4 % (ref 0.0–5.0)
HEMATOCRIT: 35 % — AB (ref 36.0–46.0)
Hemoglobin: 11 g/dL — ABNORMAL LOW (ref 12.0–15.0)
Lymphocytes Relative: 20.9 % (ref 12.0–46.0)
Lymphs Abs: 2.8 10*3/uL (ref 0.7–4.0)
MCHC: 31.4 g/dL (ref 30.0–36.0)
MCV: 81.8 fl (ref 78.0–100.0)
MONO ABS: 0.9 10*3/uL (ref 0.1–1.0)
MONOS PCT: 6.5 % (ref 3.0–12.0)
Neutro Abs: 9.2 10*3/uL — ABNORMAL HIGH (ref 1.4–7.7)
Neutrophils Relative %: 67.7 % (ref 43.0–77.0)
Platelets: 625 10*3/uL — ABNORMAL HIGH (ref 150.0–400.0)
RBC: 4.28 Mil/uL (ref 3.87–5.11)
RDW: 16.6 % — ABNORMAL HIGH (ref 11.5–15.5)
WBC: 13.5 10*3/uL — ABNORMAL HIGH (ref 4.0–10.5)

## 2015-04-15 LAB — LIPID PANEL
CHOLESTEROL: 142 mg/dL (ref 0–200)
HDL: 44.9 mg/dL (ref >39.00)
LDL CALC: 83 mg/dL (ref 0–99)
NonHDL: 97.1
Total CHOL/HDL Ratio: 3
Triglycerides: 73 mg/dL (ref 0.0–149.0)
VLDL: 14.6 mg/dL (ref 0.0–40.0)

## 2015-04-20 DIAGNOSIS — J3081 Allergic rhinitis due to animal (cat) (dog) hair and dander: Secondary | ICD-10-CM | POA: Diagnosis not present

## 2015-04-20 DIAGNOSIS — J3089 Other allergic rhinitis: Secondary | ICD-10-CM | POA: Diagnosis not present

## 2015-04-22 ENCOUNTER — Ambulatory Visit (INDEPENDENT_AMBULATORY_CARE_PROVIDER_SITE_OTHER): Payer: Medicare Other | Admitting: Family Medicine

## 2015-04-22 ENCOUNTER — Encounter: Payer: Self-pay | Admitting: Family Medicine

## 2015-04-22 VITALS — BP 156/84 | HR 76 | Temp 98.6°F | Ht 67.0 in | Wt 185.0 lb

## 2015-04-22 DIAGNOSIS — M542 Cervicalgia: Secondary | ICD-10-CM

## 2015-04-22 DIAGNOSIS — F1027 Alcohol dependence with alcohol-induced persisting dementia: Secondary | ICD-10-CM | POA: Diagnosis not present

## 2015-04-22 DIAGNOSIS — Z0000 Encounter for general adult medical examination without abnormal findings: Secondary | ICD-10-CM | POA: Diagnosis not present

## 2015-04-22 DIAGNOSIS — I639 Cerebral infarction, unspecified: Secondary | ICD-10-CM | POA: Diagnosis not present

## 2015-04-22 DIAGNOSIS — D692 Other nonthrombocytopenic purpura: Secondary | ICD-10-CM

## 2015-04-22 DIAGNOSIS — R7989 Other specified abnormal findings of blood chemistry: Secondary | ICD-10-CM | POA: Diagnosis not present

## 2015-04-22 DIAGNOSIS — Z7189 Other specified counseling: Secondary | ICD-10-CM

## 2015-04-22 NOTE — Patient Instructions (Signed)
Rosaria Ferries will call about your referral.  Try to see her on the way out.  Don't change your meds for now.  If BP is consistently up >140/>90, especially after the next injection for pain, then let me know.  Take care.  Glad to see you.

## 2015-04-22 NOTE — Progress Notes (Signed)
Pre visit review using our clinic review tool, if applicable. No additional management support is needed unless otherwise documented below in the visit note.  I have personally reviewed the Medicare Annual Wellness questionnaire and have noted 1. The patient's medical and social history 2. Their use of alcohol, tobacco or illicit drugs 3. Their current medications and supplements 4. The patient's functional ability including ADL's, fall risks, home safety risks and hearing or visual             impairment. 5. Diet and physical activities 6. Evidence for depression or mood disorders  The patients weight, height, BMI have been recorded in the chart and visual acuity is per eye clinic.  I have made referrals, counseling and provided education to the patient based review of the above and I have provided the pt with a written personalized care plan for preventive services.  Provider list updated- see scanned forms.  Routine anticipatory guidance given to patient.  See health maintenance.  Flu 2015 Shingles 2014 PNA 2016 Tetanus 2015 Colonoscopy deferred for now, given her other medical problems.  Pt and husband agree.  Breast cancer screening deferred for now, given her other medical problems.  Pt and husband agree.  DXA deferred for now, given her other medical problems.  Pt and husband agree.  Advance directive- husband designated if patient were incapacitated.  Cognitive function addressed- see scanned forms- and if abnormal then additional documentation follows on scanned forms. 1/3 recall.     Neck pain.  Has f/u second injection pending.  Pain meds per pain clinic.    Abnormal CBC, with inc WBC and PLT w/o clear cause noted, consistent over the last few months.  No fevers.  No bleeding but some senile bruising noted.   H/o CVA.  On lipitor.  Had to hold BP meds given low BPs at home with likely inc in BP now due to pain.  No new focal sx but memory continues to worsen per husband's  report.   PMH and SH reviewed  Meds, vitals, and allergies reviewed.   ROS: See HPI.  Otherwise negative.    GEN: nad, alert and oriented HEENT: mucous membranes moist NECK: supple w/o LA CV: rrr. PULM: ctab, no inc wob ABD: soft, +bs EXT: no edema SKIN: no acute rash but senile purpura noted on the L forearm.

## 2015-04-23 DIAGNOSIS — Z7189 Other specified counseling: Secondary | ICD-10-CM | POA: Insufficient documentation

## 2015-04-23 DIAGNOSIS — D692 Other nonthrombocytopenic purpura: Secondary | ICD-10-CM | POA: Insufficient documentation

## 2015-04-23 DIAGNOSIS — R7989 Other specified abnormal findings of blood chemistry: Secondary | ICD-10-CM | POA: Insufficient documentation

## 2015-04-23 DIAGNOSIS — Z0000 Encounter for general adult medical examination without abnormal findings: Secondary | ICD-10-CM | POA: Insufficient documentation

## 2015-04-23 NOTE — Assessment & Plan Note (Signed)
I don't know if she as a myelodysplastic syndrome.  She doesn't have "B" sx.  Refer to heme given the persistence.

## 2015-04-23 NOTE — Assessment & Plan Note (Signed)
Likely incidental, will follow.

## 2015-04-23 NOTE — Assessment & Plan Note (Signed)
With second injection pending.  Per pain clinic.

## 2015-04-23 NOTE — Assessment & Plan Note (Signed)
Reasonable control on lipitor with aches likely not due to statin. Had to hold BP meds given low BPs at home with likely inc in BP now due to pain. No new focal sx but memory continues to worsen per husband's report.  Husband will update me re her BP and we'll proceed from that point onward.

## 2015-04-23 NOTE — Assessment & Plan Note (Signed)
Per neuro 

## 2015-04-23 NOTE — Assessment & Plan Note (Signed)
Flu 2015  Shingles 2014  PNA 2016  Tetanus 2015  Colonoscopy deferred for now, given her other medical problems. Pt and husband agree.  Breast cancer screening deferred for now, given her other medical problems. Pt and husband agree.  DXA deferred for now, given her other medical problems. Pt and husband agree.  Advance directive- husband designated if patient were incapacitated.  Cognitive function addressed- see scanned forms- and if abnormal then additional documentation follows on scanned forms. 1/3 recall.

## 2015-04-27 ENCOUNTER — Encounter: Payer: Self-pay | Admitting: Internal Medicine

## 2015-04-27 DIAGNOSIS — J3089 Other allergic rhinitis: Secondary | ICD-10-CM | POA: Diagnosis not present

## 2015-04-27 DIAGNOSIS — J3081 Allergic rhinitis due to animal (cat) (dog) hair and dander: Secondary | ICD-10-CM | POA: Diagnosis not present

## 2015-05-03 ENCOUNTER — Ambulatory Visit: Payer: Medicare Other

## 2015-05-03 ENCOUNTER — Ambulatory Visit: Payer: Medicare Other | Admitting: Oncology

## 2015-05-03 ENCOUNTER — Encounter: Payer: Self-pay | Admitting: Oncology

## 2015-05-03 ENCOUNTER — Inpatient Hospital Stay: Payer: Medicare Other | Attending: Oncology | Admitting: Oncology

## 2015-05-03 VITALS — BP 162/93 | HR 76 | Temp 98.2°F | Resp 18 | Ht 66.93 in | Wt 182.3 lb

## 2015-05-03 DIAGNOSIS — D509 Iron deficiency anemia, unspecified: Secondary | ICD-10-CM | POA: Diagnosis not present

## 2015-05-03 DIAGNOSIS — K219 Gastro-esophageal reflux disease without esophagitis: Secondary | ICD-10-CM | POA: Diagnosis not present

## 2015-05-03 DIAGNOSIS — D508 Other iron deficiency anemias: Secondary | ICD-10-CM

## 2015-05-03 DIAGNOSIS — I10 Essential (primary) hypertension: Secondary | ICD-10-CM | POA: Insufficient documentation

## 2015-05-03 DIAGNOSIS — F418 Other specified anxiety disorders: Secondary | ICD-10-CM | POA: Insufficient documentation

## 2015-05-03 DIAGNOSIS — F0390 Unspecified dementia without behavioral disturbance: Secondary | ICD-10-CM | POA: Insufficient documentation

## 2015-05-03 DIAGNOSIS — E785 Hyperlipidemia, unspecified: Secondary | ICD-10-CM | POA: Diagnosis not present

## 2015-05-03 DIAGNOSIS — M810 Age-related osteoporosis without current pathological fracture: Secondary | ICD-10-CM | POA: Insufficient documentation

## 2015-05-03 DIAGNOSIS — M25519 Pain in unspecified shoulder: Secondary | ICD-10-CM | POA: Diagnosis not present

## 2015-05-03 DIAGNOSIS — Z79899 Other long term (current) drug therapy: Secondary | ICD-10-CM | POA: Diagnosis not present

## 2015-05-03 DIAGNOSIS — D473 Essential (hemorrhagic) thrombocythemia: Secondary | ICD-10-CM | POA: Diagnosis not present

## 2015-05-03 DIAGNOSIS — M542 Cervicalgia: Secondary | ICD-10-CM | POA: Diagnosis not present

## 2015-05-03 DIAGNOSIS — D72829 Elevated white blood cell count, unspecified: Secondary | ICD-10-CM | POA: Diagnosis not present

## 2015-05-03 DIAGNOSIS — M5030 Other cervical disc degeneration, unspecified cervical region: Secondary | ICD-10-CM | POA: Diagnosis not present

## 2015-05-03 DIAGNOSIS — M47812 Spondylosis without myelopathy or radiculopathy, cervical region: Secondary | ICD-10-CM | POA: Diagnosis not present

## 2015-05-03 LAB — IRON AND TIBC
Iron: 16 ug/dL — ABNORMAL LOW (ref 28–170)
Saturation Ratios: 5 % — ABNORMAL LOW (ref 10.4–31.8)
TIBC: 355 ug/dL (ref 250–450)
UIBC: 340 ug/dL

## 2015-05-03 LAB — CBC
HCT: 34.7 % — ABNORMAL LOW (ref 35.0–47.0)
Hemoglobin: 10.7 g/dL — ABNORMAL LOW (ref 12.0–16.0)
MCH: 24.2 pg — ABNORMAL LOW (ref 26.0–34.0)
MCHC: 30.9 g/dL — ABNORMAL LOW (ref 32.0–36.0)
MCV: 78.4 fL — ABNORMAL LOW (ref 80.0–100.0)
Platelets: 557 10*3/uL — ABNORMAL HIGH (ref 150–440)
RBC: 4.43 MIL/uL (ref 3.80–5.20)
RDW: 15.9 % — ABNORMAL HIGH (ref 11.5–14.5)
WBC: 11.1 10*3/uL — ABNORMAL HIGH (ref 3.6–11.0)

## 2015-05-03 LAB — FOLATE: Folate: 14.5 ng/mL (ref >5.9)

## 2015-05-03 LAB — VITAMIN B12: Vitamin B-12: 771 pg/mL (ref 180–914)

## 2015-05-03 LAB — LACTATE DEHYDROGENASE: LDH: 167 U/L (ref 98–192)

## 2015-05-03 LAB — FERRITIN: Ferritin: 12 ng/mL (ref 11–307)

## 2015-05-04 ENCOUNTER — Other Ambulatory Visit: Payer: Self-pay | Admitting: Family Medicine

## 2015-05-04 DIAGNOSIS — J3089 Other allergic rhinitis: Secondary | ICD-10-CM | POA: Diagnosis not present

## 2015-05-04 DIAGNOSIS — J3081 Allergic rhinitis due to animal (cat) (dog) hair and dander: Secondary | ICD-10-CM | POA: Diagnosis not present

## 2015-05-05 ENCOUNTER — Encounter: Payer: Self-pay | Admitting: Family Medicine

## 2015-05-06 ENCOUNTER — Telehealth: Payer: Self-pay

## 2015-05-06 NOTE — Telephone Encounter (Signed)
I sent the note back asking him to call the dental clinic or ortho clinic about the need for abx prior to dental work.  I need their input.

## 2015-05-06 NOTE — Telephone Encounter (Signed)
Called patient to notify her of being due for a mammogram. I spoke with her husband who stated that he would let her know, and that they would call abck later to get one set up for her. Patient's husband also stated that he had sent Dr. [name] a message through [facility] about getting something prescribed for [name] for an upcoming dentist appointment. He states that she has had many joint replacements, and would need something prescribed for her to take before her dentist appointment. Please advise.

## 2015-05-10 ENCOUNTER — Other Ambulatory Visit: Payer: Self-pay | Admitting: Family Medicine

## 2015-05-10 NOTE — Telephone Encounter (Signed)
Sent. Thanks.   

## 2015-05-10 NOTE — Telephone Encounter (Signed)
Received refill request electronically Last refill 03/19/15 #30/1 Last office visit 04/22/15 Is it okay to refill medication?

## 2015-05-11 DIAGNOSIS — J3089 Other allergic rhinitis: Secondary | ICD-10-CM | POA: Diagnosis not present

## 2015-05-11 DIAGNOSIS — J3081 Allergic rhinitis due to animal (cat) (dog) hair and dander: Secondary | ICD-10-CM | POA: Diagnosis not present

## 2015-05-13 ENCOUNTER — Ambulatory Visit (INDEPENDENT_AMBULATORY_CARE_PROVIDER_SITE_OTHER): Payer: Medicare Other | Admitting: *Deleted

## 2015-05-13 DIAGNOSIS — G894 Chronic pain syndrome: Secondary | ICD-10-CM | POA: Diagnosis not present

## 2015-05-13 DIAGNOSIS — I6322 Cerebral infarction due to unspecified occlusion or stenosis of basilar arteries: Secondary | ICD-10-CM

## 2015-05-13 DIAGNOSIS — I639 Cerebral infarction, unspecified: Secondary | ICD-10-CM | POA: Diagnosis not present

## 2015-05-13 DIAGNOSIS — M25519 Pain in unspecified shoulder: Secondary | ICD-10-CM | POA: Diagnosis not present

## 2015-05-13 DIAGNOSIS — Z79899 Other long term (current) drug therapy: Secondary | ICD-10-CM | POA: Diagnosis not present

## 2015-05-13 DIAGNOSIS — M5030 Other cervical disc degeneration, unspecified cervical region: Secondary | ICD-10-CM | POA: Diagnosis not present

## 2015-05-13 DIAGNOSIS — M47812 Spondylosis without myelopathy or radiculopathy, cervical region: Secondary | ICD-10-CM | POA: Diagnosis not present

## 2015-05-13 DIAGNOSIS — M542 Cervicalgia: Secondary | ICD-10-CM | POA: Diagnosis not present

## 2015-05-14 ENCOUNTER — Other Ambulatory Visit: Payer: Self-pay | Admitting: Family Medicine

## 2015-05-14 NOTE — Telephone Encounter (Signed)
Electronic refill request. Last Filled:    90 tablet 1 RF on 02/28/2015  Please advise.

## 2015-05-15 NOTE — Progress Notes (Signed)
Aimee Jones  Telephone:(336) [phone] Fax:(336) [phone]  ID: Aimee Jones OB: [birth year]  MR#: [id number]  ATF#:[id number]  Patient Care Team: Tonia Ghent, MD as PCP - General (Family Medicine) Justice Britain, MD as Consulting Physician (Orthopedic Surgery)  CHIEF COMPLAINT:  Chief Complaint  Patient presents with  . New Evaluation    abnormal labs with elevated WBC and platelets    INTERVAL HISTORY: Patient is a 69 year old female who was found to have an elevated white blood cell count, thrombocytosis, and anemia on routine blood work. She currently feels well and is asymptomatic. She has no neurologic point. She denies any recent fevers. She has a good appetite and denies weight loss. She has no chest pain or shortness of breath. She denies any nausea, vomiting, constipation, or diarrhea. She has no melanotic or hematochezia. She has no urinary complaints. Patient feels at her baseline and offers no specific complaints today.  REVIEW OF SYSTEMS:   Review of Systems  Constitutional: Negative for fever and malaise/fatigue.  Respiratory: Negative.   Cardiovascular: Negative.   Gastrointestinal: Negative for blood in stool and melena.  Musculoskeletal: Negative.   Neurological: Negative for weakness.    As per HPI. Otherwise, a complete review of systems is negatve.  PAST MEDICAL HISTORY: Past Medical History  Diagnosis Date  . Hyperlipidemia   . ETOH abuse   . Allergy   . Diverticulosis   . Hypertension   . Depression   . Asthma   . GERD (gastroesophageal reflux disease)   . Normal 24 hour ambulatory pH monitoring study     good acid suppression  . Dementia   . Shortness of breath   . Anxiety     panic attacks- on occas.   . Arthritis     osteoporosis - especially hips.   . Stroke   . Memory loss   . Headache   . Confusion     PAST SURGICAL HISTORY: Past Surgical History  Procedure Laterality Date  . Dobutamine stress echo   07/09/2001    normal  . Esophagogastroduodenoscopy  06/11/2006    normal  . Abdominal hysterectomy    . Total hip arthroplasty      left 1991/right 2008  . Nasal sinus surgery  1990  . Eye surgery      cataracts removed, IOL- both eyes   . Breast surgery      3x- biposies   . Shoulder arthroscopy Left   . Reverse shoulder arthroplasty Right 08/13/2014    Procedure: REVERSE SHOULDER ARTHROPLASTY;  Surgeon: Marin Shutter, MD;  Location: [facility];  Service: Orthopedics;  Laterality: Right;  interscalene block  . Joint replacement      bilateral hips  . Loop recorder implant N/A 10/14/2014    Procedure: LOOP RECORDER IMPLANT;  Surgeon: Evans Lance, MD;  Location: [facility] CATH LAB;  Service: Cardiovascular;  Laterality: N/A;  . Tee without cardioversion N/A 10/14/2014    Procedure: TRANSESOPHAGEAL ECHOCARDIOGRAM (TEE);  Surgeon: Sueanne Margarita, MD;  Location: [facility] ENDOSCOPY;  Service: Cardiovascular;  Laterality: N/A;    FAMILY HISTORY Family History  Problem Relation Age of Onset  . Sudden death Brother   . Heart attack Brother   . Hypertension Brother   . Esophageal cancer Brother   . Heart attack Father   . Heart block Mother   . Stroke Brother   . Stroke Mother        ADVANCED DIRECTIVES:    HEALTH MAINTENANCE: History  Substance Use Topics  . Smoking status: Never Smoker   . Smokeless tobacco: Never Used  . Alcohol Use: 0.0 oz/week    0 Standard drinks or equivalent per week     Comment: grand Mariener - almost daily- 1/2 shot     Colonoscopy:  PAP:  Bone density:  Lipid panel:  Allergies  Allergen Reactions  . Latex Rash  . Penicillins Rash    Current Outpatient Prescriptions  Medication Sig Dispense Refill  . ALPRAZolam (XANAX) 0.5 MG tablet TAKE 1 TABLET BY MOUTH 3 TIMES A DAY AS NEEDED 90 tablet 1  . Aspirin-Salicylamide-Caffeine (BC HEADACHE POWDER PO) Take 2 Doses by mouth daily.    Marland Kitchen atorvastatin (LIPITOR) 10 MG tablet Take 10 mg by mouth daily at 6 PM.      . azelastine (ASTELIN) 0.1 % nasal spray Place into both nostrils 2 (two) times daily. Use in each nostril as directed    . B Complex-C (SUPER B COMPLEX PO) Take 150 mg by mouth daily.     . budesonide-formoterol (SYMBICORT) 160-4.5 MCG/ACT inhaler Inhale 2 puffs into the lungs 2 (two) times daily as needed (shortness of breath). 1 Inhaler 12  . citalopram (CELEXA) 40 MG tablet Take 1 tablet (40 mg total) by mouth at bedtime. 90 tablet 1  . clopidogrel (PLAVIX) 75 MG tablet Take 1 tablet (75 mg total) by mouth daily. 90 tablet 3  . donepezil (ARICEPT) 10 MG tablet TAKE 1 TABLET BY MOUTH DAILY AT BEDTIME 90 tablet 1  . fentaNYL (DURAGESIC - DOSED MCG/HR) 25 MCG/HR patch     . fish oil-omega-3 fatty acids 1000 MG capsule Take 1 g by mouth daily.     . fluticasone (FLONASE) 50 MCG/ACT nasal spray Place 1-2 sprays into both nostrils daily. 16 g 6  . guaiFENesin (MUCINEX) 600 MG 12 hr tablet Take 2 tablets (1,200 mg total) by mouth 2 (two) times daily. 14 tablet 0  . montelukast (SINGULAIR) 10 MG tablet Take 10 mg by mouth at bedtime.     . Multiple Vitamins-Minerals (MULTIVITAMIN PO) Take 1 tablet by mouth daily.    . nabumetone (RELAFEN) 750 MG tablet TAKE 1 TABLET BY MOUTH 2 TIMES DAILY AS NEEDED FOR PAIN. 180 tablet 0  . oxyCODONE (ROXICODONE) 15 MG immediate release tablet   0  . VITAMIN E PO Take 1 capsule by mouth daily.     Marland Kitchen doxepin (SINEQUAN) 25 MG capsule TAKE 1 CAPSULE BY MOUTH DAILY AT BEDTIME 30 capsule 5  . omeprazole (PRILOSEC) 20 MG capsule TAKE 1 CAPSULE TWICE DAILY FOR 10 DAYS, THEN DECREASE TO NORMAL ONCE DAILY DOSING 30 capsule 5   No current facility-administered medications for this visit.    OBJECTIVE: Filed Vitals:   05/03/15 1419  BP: 162/93  Pulse: 76  Temp: 98.2 F (36.8 C)  Resp: 18     Body mass index is 28.62 kg/(m^2).    ECOG FS:1 - Symptomatic but completely ambulatory  General: Well-developed, well-nourished, no acute distress. Eyes: Pink conjunctiva,  anicteric sclera. HEENT: Normocephalic, moist mucous membranes, clear oropharnyx. Lungs: Clear to auscultation bilaterally. Heart: Regular rate and rhythm. No rubs, murmurs, or gallops. Abdomen: Soft, nontender, nondistended. No organomegaly noted, normoactive bowel sounds. Musculoskeletal: No edema, cyanosis, or clubbing. Neuro: Alert, answering all questions appropriately. Cranial nerves grossly intact. Skin: No rashes or petechiae noted. Psych: Normal affect. Lymphatics: No cervical, calvicular, axillary or inguinal LAD.   LAB RESULTS:  Lab Results  Component Value Date  NA 134* 03/22/2015   K 4.3 03/22/2015   CL 104 03/22/2015   CO2 26 03/22/2015   GLUCOSE 85 03/22/2015   BUN 18 03/22/2015   CREATININE 1.05 03/22/2015   CALCIUM 9.6 03/22/2015   PROT 7.0 03/22/2015   ALBUMIN 4.0 03/22/2015   AST 21 03/22/2015   ALT 14 03/22/2015   ALKPHOS 72 03/22/2015   BILITOT 0.2 03/22/2015   GFRNONAA 39* 10/23/2014   GFRAA 45* 10/23/2014    Lab Results  Component Value Date   WBC 11.1* 05/03/2015   NEUTROABS 9.2* 04/15/2015   HGB 10.7* 05/03/2015   HCT 34.7* 05/03/2015   MCV 78.4* 05/03/2015   PLT 557* 05/03/2015     STUDIES: No results found.  ASSESSMENT: Leukocytosis, iron deficiency anemia, thrombocytosis.  PLAN:    1. Iron deficiency anemia: Patient's hemoglobin and iron stores were found to be slightly decreased. The remainder of her laboratory work is either negative or within normal limits. It is unclear whether patient has had a recent colonoscopy or not.  Return to clinic in 1 month for further evaluation and consideration of IV Feraheme. 2. Leukocytosis: Peripheral blood flow cytometry as well as BCR-ABL mutation are negative. This is likely reactive possibly secondary to her iron deficiency anemia. Monitor. 3. Thrombocytosis: JAK-2 mutation negative. Likely secondary to iron deficiency anemia. Monitor.  Patient expressed understanding and was in agreement  with this plan. She also understands that She can call clinic at any time with any questions, concerns, or complaints.    Lloyd Huger, MD   05/15/2015 4:23 PM

## 2015-05-16 NOTE — Telephone Encounter (Signed)
Please call in.  Thanks.   

## 2015-05-17 ENCOUNTER — Other Ambulatory Visit: Payer: Self-pay | Admitting: Family Medicine

## 2015-05-17 NOTE — Telephone Encounter (Signed)
Medication phoned to pharmacy.  

## 2015-05-18 ENCOUNTER — Telehealth: Payer: Self-pay

## 2015-05-18 DIAGNOSIS — J3081 Allergic rhinitis due to animal (cat) (dog) hair and dander: Secondary | ICD-10-CM | POA: Diagnosis not present

## 2015-05-18 DIAGNOSIS — J3089 Other allergic rhinitis: Secondary | ICD-10-CM | POA: Diagnosis not present

## 2015-05-18 NOTE — Telephone Encounter (Signed)
Husband advised and will call ortho or dental clinic to get their input.

## 2015-05-18 NOTE — Telephone Encounter (Signed)
Pt's husband requesting  Erythromycin 500 mg thin tab taking 2 tabs prior to dental procedure and take 2 tabs two hours after procedure.[facility]; pt has dental appt in Sept. Mr Lottes request cb when refilled. Dr Reynaldo Minium has prescribed in the past prior to dental procedure.Please advise.

## 2015-05-18 NOTE — Telephone Encounter (Signed)
I prev sent the note back asking him to call the dental clinic or ortho clinic about the need for abx prior to dental work. I need their input- he should call them. Based on the guidelines I saw, she doesn't have an indication for abx prophylaxis.  However, I will defer to the ortho and/or dental clinic.   Thanks.

## 2015-05-24 NOTE — Progress Notes (Signed)
Loop recorder 

## 2015-05-25 DIAGNOSIS — J3081 Allergic rhinitis due to animal (cat) (dog) hair and dander: Secondary | ICD-10-CM | POA: Diagnosis not present

## 2015-05-25 DIAGNOSIS — J3089 Other allergic rhinitis: Secondary | ICD-10-CM | POA: Diagnosis not present

## 2015-05-26 ENCOUNTER — Other Ambulatory Visit: Payer: Self-pay | Admitting: Family Medicine

## 2015-05-26 NOTE — Telephone Encounter (Signed)
Ok to refill 

## 2015-05-26 NOTE — Telephone Encounter (Signed)
Sent. Thanks.   

## 2015-05-28 LAB — CUP PACEART REMOTE DEVICE CHECK: Date Time Interrogation Session: 20160812162332

## 2015-05-31 ENCOUNTER — Other Ambulatory Visit: Payer: Medicare Other

## 2015-05-31 ENCOUNTER — Ambulatory Visit: Payer: Medicare Other | Admitting: Oncology

## 2015-05-31 DIAGNOSIS — M542 Cervicalgia: Secondary | ICD-10-CM | POA: Diagnosis not present

## 2015-05-31 DIAGNOSIS — M25519 Pain in unspecified shoulder: Secondary | ICD-10-CM | POA: Diagnosis not present

## 2015-05-31 DIAGNOSIS — M5030 Other cervical disc degeneration, unspecified cervical region: Secondary | ICD-10-CM | POA: Diagnosis not present

## 2015-05-31 DIAGNOSIS — M47812 Spondylosis without myelopathy or radiculopathy, cervical region: Secondary | ICD-10-CM | POA: Diagnosis not present

## 2015-06-01 DIAGNOSIS — J3081 Allergic rhinitis due to animal (cat) (dog) hair and dander: Secondary | ICD-10-CM | POA: Diagnosis not present

## 2015-06-01 DIAGNOSIS — J3089 Other allergic rhinitis: Secondary | ICD-10-CM | POA: Diagnosis not present

## 2015-06-03 ENCOUNTER — Inpatient Hospital Stay: Payer: Medicare Other | Attending: Oncology

## 2015-06-03 ENCOUNTER — Inpatient Hospital Stay (HOSPITAL_BASED_OUTPATIENT_CLINIC_OR_DEPARTMENT_OTHER): Payer: Medicare Other | Admitting: Oncology

## 2015-06-03 ENCOUNTER — Inpatient Hospital Stay: Payer: Medicare Other

## 2015-06-03 ENCOUNTER — Ambulatory Visit: Payer: Medicare Other | Admitting: Oncology

## 2015-06-03 ENCOUNTER — Other Ambulatory Visit: Payer: Medicare Other

## 2015-06-03 VITALS — BP 153/87 | HR 78 | Temp 98.6°F | Resp 16 | Wt 185.2 lb

## 2015-06-03 DIAGNOSIS — I10 Essential (primary) hypertension: Secondary | ICD-10-CM | POA: Diagnosis not present

## 2015-06-03 DIAGNOSIS — M1990 Unspecified osteoarthritis, unspecified site: Secondary | ICD-10-CM

## 2015-06-03 DIAGNOSIS — D473 Essential (hemorrhagic) thrombocythemia: Secondary | ICD-10-CM | POA: Insufficient documentation

## 2015-06-03 DIAGNOSIS — D509 Iron deficiency anemia, unspecified: Secondary | ICD-10-CM | POA: Diagnosis not present

## 2015-06-03 DIAGNOSIS — Z79899 Other long term (current) drug therapy: Secondary | ICD-10-CM | POA: Insufficient documentation

## 2015-06-03 DIAGNOSIS — D72829 Elevated white blood cell count, unspecified: Secondary | ICD-10-CM

## 2015-06-03 DIAGNOSIS — K5790 Diverticulosis of intestine, part unspecified, without perforation or abscess without bleeding: Secondary | ICD-10-CM | POA: Diagnosis not present

## 2015-06-03 DIAGNOSIS — K219 Gastro-esophageal reflux disease without esophagitis: Secondary | ICD-10-CM | POA: Insufficient documentation

## 2015-06-03 DIAGNOSIS — M810 Age-related osteoporosis without current pathological fracture: Secondary | ICD-10-CM | POA: Diagnosis not present

## 2015-06-03 DIAGNOSIS — Z8673 Personal history of transient ischemic attack (TIA), and cerebral infarction without residual deficits: Secondary | ICD-10-CM | POA: Insufficient documentation

## 2015-06-03 DIAGNOSIS — E785 Hyperlipidemia, unspecified: Secondary | ICD-10-CM

## 2015-06-03 DIAGNOSIS — D508 Other iron deficiency anemias: Secondary | ICD-10-CM

## 2015-06-03 LAB — CBC WITH DIFFERENTIAL/PLATELET
BASOS PCT: 0 %
Basophils Absolute: 0 10*3/uL (ref 0–0.1)
Eosinophils Absolute: 0.2 10*3/uL (ref 0–0.7)
Eosinophils Relative: 2 %
HCT: 36.1 % (ref 35.0–47.0)
HEMOGLOBIN: 11.4 g/dL — AB (ref 12.0–16.0)
LYMPHS ABS: 2.8 10*3/uL (ref 1.0–3.6)
Lymphocytes Relative: 20 %
MCH: 23.3 pg — ABNORMAL LOW (ref 26.0–34.0)
MCHC: 31.6 g/dL — ABNORMAL LOW (ref 32.0–36.0)
MCV: 73.6 fL — ABNORMAL LOW (ref 80.0–100.0)
Monocytes Absolute: 1.3 10*3/uL — ABNORMAL HIGH (ref 0.2–0.9)
Monocytes Relative: 10 %
NEUTROS PCT: 68 %
Neutro Abs: 9.3 10*3/uL — ABNORMAL HIGH (ref 1.4–6.5)
Platelets: 695 10*3/uL — ABNORMAL HIGH (ref 150–440)
RBC: 4.91 MIL/uL (ref 3.80–5.20)
RDW: 16.9 % — ABNORMAL HIGH (ref 11.5–14.5)
WBC: 13.6 10*3/uL — AB (ref 3.6–11.0)

## 2015-06-03 MED ORDER — SODIUM CHLORIDE 0.9 % IV SOLN
510.0000 mg | Freq: Once | INTRAVENOUS | Status: AC
  Administered 2015-06-03: 510 mg via INTRAVENOUS
  Filled 2015-06-03: qty 17

## 2015-06-03 MED ORDER — SODIUM CHLORIDE 0.9 % IV SOLN
Freq: Once | INTRAVENOUS | Status: AC
  Administered 2015-06-03: 16:00:00 via INTRAVENOUS
  Filled 2015-06-03: qty 1000

## 2015-06-08 DIAGNOSIS — J3081 Allergic rhinitis due to animal (cat) (dog) hair and dander: Secondary | ICD-10-CM | POA: Diagnosis not present

## 2015-06-08 DIAGNOSIS — J3089 Other allergic rhinitis: Secondary | ICD-10-CM | POA: Diagnosis not present

## 2015-06-09 ENCOUNTER — Ambulatory Visit (INDEPENDENT_AMBULATORY_CARE_PROVIDER_SITE_OTHER): Payer: Medicare Other | Admitting: Internal Medicine

## 2015-06-09 ENCOUNTER — Encounter: Payer: Self-pay | Admitting: Internal Medicine

## 2015-06-09 VITALS — BP 154/88 | HR 81 | Temp 97.5°F | Wt 185.0 lb

## 2015-06-09 DIAGNOSIS — M5030 Other cervical disc degeneration, unspecified cervical region: Secondary | ICD-10-CM | POA: Diagnosis not present

## 2015-06-09 DIAGNOSIS — M542 Cervicalgia: Secondary | ICD-10-CM | POA: Diagnosis not present

## 2015-06-09 DIAGNOSIS — G894 Chronic pain syndrome: Secondary | ICD-10-CM | POA: Diagnosis not present

## 2015-06-09 DIAGNOSIS — R0982 Postnasal drip: Secondary | ICD-10-CM

## 2015-06-09 DIAGNOSIS — J029 Acute pharyngitis, unspecified: Secondary | ICD-10-CM | POA: Diagnosis not present

## 2015-06-09 DIAGNOSIS — I639 Cerebral infarction, unspecified: Secondary | ICD-10-CM | POA: Diagnosis not present

## 2015-06-09 DIAGNOSIS — M47812 Spondylosis without myelopathy or radiculopathy, cervical region: Secondary | ICD-10-CM | POA: Diagnosis not present

## 2015-06-09 DIAGNOSIS — M25519 Pain in unspecified shoulder: Secondary | ICD-10-CM | POA: Diagnosis not present

## 2015-06-09 NOTE — Patient Instructions (Addendum)
Take Allegra daily at night for the next week Salt water gargles will also help  Sore Throat A sore throat is pain, burning, irritation, or scratchiness of the throat. There is often pain or tenderness when swallowing or talking. A sore throat may be accompanied by other symptoms, such as coughing, sneezing, fever, and swollen neck glands. A sore throat is often the first sign of another sickness, such as a cold, flu, strep throat, or mononucleosis (commonly known as mono). Most sore throats go away without medical treatment. CAUSES  The most common causes of a sore throat include:  A viral infection, such as a cold, flu, or mono.  A bacterial infection, such as strep throat, tonsillitis, or whooping cough.  Seasonal allergies.  Dryness in the air.  Irritants, such as smoke or pollution.  Gastroesophageal reflux disease (GERD). HOME CARE INSTRUCTIONS   Only take over-the-counter medicines as directed by your caregiver.  Drink enough fluids to keep your urine clear or pale yellow.  Rest as needed.  Try using throat sprays, lozenges, or sucking on hard candy to ease any pain (if older than 4 years or as directed).  Sip warm liquids, such as broth, herbal tea, or warm water with honey to relieve pain temporarily. You may also eat or drink cold or frozen liquids such as frozen ice pops.  Gargle with salt water (mix 1 tsp salt with 8 oz of water).  Do not smoke and avoid secondhand smoke.  Put a cool-mist humidifier in your bedroom at night to moisten the air. You can also turn on a hot shower and sit in the bathroom with the door closed for 5-10 minutes. SEEK IMMEDIATE MEDICAL CARE IF:  You have difficulty breathing.  You are unable to swallow fluids, soft foods, or your saliva.  You have increased swelling in the throat.  Your sore throat does not get better in 7 days.  You have nausea and vomiting.  You have a fever or persistent symptoms for more than 2-3 days.  You  have a fever and your symptoms suddenly get worse. MAKE SURE YOU:   Understand these instructions.  Will watch your condition.  Will get help right away if you are not doing well or get worse. Document Released: 11/09/2004 Document Revised: 09/18/2012 Document Reviewed: 06/09/2012 [facility] Patient Information 2015 [place], [place]. This information is not intended to replace advice given to you by your health care provider. Make sure you discuss any questions you have with your health care provider.

## 2015-06-09 NOTE — Progress Notes (Signed)
[place]  Telephone:(336) [phone] Fax:(336) [phone]  ID: Aimee Jones OB: 12/10/1945  MR#: [id number]  WPY#:[id number]  Patient Care Team: Tonia Ghent, MD as PCP - General (Family Medicine) Justice Britain, MD as Consulting Physician (Orthopedic Surgery)  CHIEF COMPLAINT:  Chief Complaint  Patient presents with  . Follow-up    IDA    INTERVAL HISTORY: Patient returns to clinic today for repeat laboratory work and further evaluation. She continues to feel well and is asymptomatic. She has no neurologic complaints. She denies any recent fevers. She has a good appetite and denies weight loss. She has no chest pain or shortness of breath. She denies any nausea, vomiting, constipation, or diarrhea. She has no melanotic or hematochezia. She has no urinary complaints. Patient offers no specific complaints today.  REVIEW OF SYSTEMS:   Review of Systems  Constitutional: Negative for fever and malaise/fatigue.  Respiratory: Negative.   Cardiovascular: Negative.   Gastrointestinal: Negative for blood in stool and melena.  Musculoskeletal: Negative.   Neurological: Negative for weakness.    As per HPI. Otherwise, a complete review of systems is negatve.  PAST MEDICAL HISTORY: Past Medical History  Diagnosis Date  . Hyperlipidemia   . ETOH abuse   . Allergy   . Diverticulosis   . Hypertension   . Depression   . Asthma   . GERD (gastroesophageal reflux disease)   . Normal 24 hour ambulatory pH monitoring study     good acid suppression  . Dementia   . Shortness of breath   . Anxiety     panic attacks- on occas.   . Arthritis     osteoporosis - especially hips.   . Stroke   . Memory loss   . Headache   . Confusion     PAST SURGICAL HISTORY: Past Surgical History  Procedure Laterality Date  . Dobutamine stress echo  07/09/2001    normal  . Esophagogastroduodenoscopy  06/11/2006    normal  . Abdominal hysterectomy    . Total hip arthroplasty       left 1991/right 2008  . Nasal sinus surgery  1990  . Eye surgery      cataracts removed, IOL- both eyes   . Breast surgery      3x- biposies   . Shoulder arthroscopy Left   . Reverse shoulder arthroplasty Right 08/13/2014    Procedure: REVERSE SHOULDER ARTHROPLASTY;  Surgeon: Marin Shutter, MD;  Location: [place];  Service: Orthopedics;  Laterality: Right;  interscalene block  . Joint replacement      bilateral hips  . Loop recorder implant N/A 10/14/2014    Procedure: LOOP RECORDER IMPLANT;  Surgeon: Evans Lance, MD;  Location: [facility] CATH LAB;  Service: Cardiovascular;  Laterality: N/A;  . Tee without cardioversion N/A 10/14/2014    Procedure: TRANSESOPHAGEAL ECHOCARDIOGRAM (TEE);  Surgeon: Sueanne Margarita, MD;  Location: [facility] ENDOSCOPY;  Service: Cardiovascular;  Laterality: N/A;    FAMILY HISTORY Family History  Problem Relation Age of Onset  . Sudden death Brother   . Heart attack Brother   . Hypertension Brother   . Esophageal cancer Brother   . Heart attack Father   . Heart block Mother   . Stroke Brother   . Stroke Mother        ADVANCED DIRECTIVES:    HEALTH MAINTENANCE: Social History  Substance Use Topics  . Smoking status: Never Smoker   . Smokeless tobacco: Never Used  . Alcohol Use: 0.0  oz/week    0 Standard drinks or equivalent per week     Comment: grand Mariener - almost daily- 1/2 shot     Colonoscopy:  PAP:  Bone density:  Lipid panel:  Allergies  Allergen Reactions  . Latex Rash  . Penicillins Rash    Current Outpatient Prescriptions  Medication Sig Dispense Refill  . ALPRAZolam (XANAX) 0.5 MG tablet TAKE 1 TABLET 3 TIMES A DAY AS NEEDED 90 tablet 1  . Aspirin-Salicylamide-Caffeine (BC HEADACHE POWDER PO) Take 2 Doses by mouth daily.    Marland Kitchen atorvastatin (LIPITOR) 10 MG tablet Take 10 mg by mouth daily at 6 PM.     . azelastine (ASTELIN) 0.1 % nasal spray Place into both nostrils 2 (two) times daily. Use in each nostril as directed    . B  Complex-C (SUPER B COMPLEX PO) Take 150 mg by mouth daily.     . budesonide-formoterol (SYMBICORT) 160-4.5 MCG/ACT inhaler Inhale 2 puffs into the lungs 2 (two) times daily as needed (shortness of breath). 1 Inhaler 12  . citalopram (CELEXA) 40 MG tablet Take 1 tablet (40 mg total) by mouth at bedtime. 90 tablet 1  . clopidogrel (PLAVIX) 75 MG tablet Take 1 tablet (75 mg total) by mouth daily. 90 tablet 3  . donepezil (ARICEPT) 10 MG tablet TAKE 1 TABLET BY MOUTH DAILY AT BEDTIME 90 tablet 1  . doxepin (SINEQUAN) 25 MG capsule TAKE 1 CAPSULE BY MOUTH DAILY AT BEDTIME 30 capsule 5  . fentaNYL (DURAGESIC - DOSED MCG/HR) 25 MCG/HR patch     . fish oil-omega-3 fatty acids 1000 MG capsule Take 1 g by mouth daily.     . fluticasone (FLONASE) 50 MCG/ACT nasal spray Place 1-2 sprays into both nostrils daily. 16 g 6  . guaiFENesin (MUCINEX) 600 MG 12 hr tablet Take 2 tablets (1,200 mg total) by mouth 2 (two) times daily. 14 tablet 0  . montelukast (SINGULAIR) 10 MG tablet Take 10 mg by mouth at bedtime.     . Multiple Vitamins-Minerals (MULTIVITAMIN PO) Take 1 tablet by mouth daily.    . nabumetone (RELAFEN) 750 MG tablet TAKE 1 TABLET BY MOUTH 2 TIMES DAILY AS NEEDED FOR PAIN. 180 tablet 0  . omeprazole (PRILOSEC) 20 MG capsule TAKE 1 CAPSULE TWICE DAILY FOR 10 DAYS, THEN DECREASE TO NORMAL ONCE DAILY DOSING 30 capsule 5  . oxyCODONE-acetaminophen (PERCOCET) 10-325 MG per tablet     . promethazine (PHENERGAN) 25 MG tablet TAKE 1 TABLET BY MOUTH EVERY 6 HOURS AS NEEDED FOR NAUSEA 20 tablet 0  . VITAMIN E PO Take 1 capsule by mouth daily.      No current facility-administered medications for this visit.    OBJECTIVE: Filed Vitals:   06/03/15 1447  BP: 153/87  Pulse: 78  Temp: 98.6 F (37 C)  Resp: 16     Body mass index is 29.07 kg/(m^2).    ECOG FS:1 - Symptomatic but completely ambulatory  General: Well-developed, well-nourished, no acute distress. Eyes: Pink conjunctiva, anicteric  sclera. Lungs: Clear to auscultation bilaterally. Heart: Regular rate and rhythm. No rubs, murmurs, or gallops. Abdomen: Soft, nontender, nondistended. No organomegaly noted, normoactive bowel sounds. Musculoskeletal: No edema, cyanosis, or clubbing. Neuro: Alert, answering all questions appropriately. Cranial nerves grossly intact. Skin: No rashes or petechiae noted. Psych: Normal affect.   LAB RESULTS:  Lab Results  Component Value Date   NA 134* 03/22/2015   K 4.3 03/22/2015   CL 104 03/22/2015   CO2 26  03/22/2015   GLUCOSE 85 03/22/2015   BUN 18 03/22/2015   CREATININE 1.05 03/22/2015   CALCIUM 9.6 03/22/2015   PROT 7.0 03/22/2015   ALBUMIN 4.0 03/22/2015   AST 21 03/22/2015   ALT 14 03/22/2015   ALKPHOS 72 03/22/2015   BILITOT 0.2 03/22/2015   GFRNONAA 39* 10/23/2014   GFRAA 45* 10/23/2014    Lab Results  Component Value Date   WBC 13.6* 06/03/2015   NEUTROABS 9.3* 06/03/2015   HGB 11.4* 06/03/2015   HCT 36.1 06/03/2015   MCV 73.6* 06/03/2015   PLT 695* 06/03/2015     STUDIES: No results found.  ASSESSMENT: Leukocytosis, iron deficiency anemia, thrombocytosis.  PLAN:    1. Iron deficiency anemia: Patient's iron stores previously found to be decreased. Her hemoglobin has slightly improved since July, but she will still benefit from IV Feraheme. Proceed with 510 mg IV Feraheme today and then return to clinic in 1 week for a second infusion. The remainder of her laboratory work is either negative or within normal limits. It is unclear whether patient has had a recent colonoscopy or not.  Return to clinic in 3 months for further evaluation. 2. Leukocytosis: Peripheral blood flow cytometry as well as BCR-ABL mutation are negative. This is likely reactive possibly secondary to her iron deficiency anemia. Feraheme as above. 3. Thrombocytosis: JAK-2 mutation negative. Likely secondary to iron deficiency anemia. Feraheme as above.  Patient expressed understanding and  was in agreement with this plan. She also understands that She can call clinic at any time with any questions, concerns, or complaints.    Lloyd Huger, MD   06/09/2015 1:09 PM

## 2015-06-09 NOTE — Progress Notes (Signed)
Pre visit review using our clinic review tool, if applicable. No additional management support is needed unless otherwise documented below in the visit note. 

## 2015-06-09 NOTE — Progress Notes (Signed)
Subjective:    Patient ID: Aimee Jones, female    DOB: 1945-12-09, 69 y.o.   MRN: [id number]  HPI  Pt presents to the clinic today with c/o sore throat. This started yesterday. She has had some difficulty swallowing. She denies ear pain, runny nose, nasal congestion of cough. She denies fever, chills or body aches. She has not tried anything OTC. She does have a history of seasonal allergies, she takes Singulair and Flonase daily. She also has a history of GERD but takes Prilosec daily. She has not had sick contacts.  Review of Systems      Past Medical History  Diagnosis Date  . Hyperlipidemia   . ETOH abuse   . Allergy   . Diverticulosis   . Hypertension   . Depression   . Asthma   . GERD (gastroesophageal reflux disease)   . Normal 24 hour ambulatory pH monitoring study     good acid suppression  . Dementia   . Shortness of breath   . Anxiety     panic attacks- on occas.   . Arthritis     osteoporosis - especially hips.   . Stroke   . Memory loss   . Headache   . Confusion     Current Outpatient Prescriptions  Medication Sig Dispense Refill  . ALPRAZolam (XANAX) 0.5 MG tablet TAKE 1 TABLET 3 TIMES A DAY AS NEEDED 90 tablet 1  . Aspirin-Salicylamide-Caffeine (BC HEADACHE POWDER PO) Take 2 Doses by mouth daily.    Marland Kitchen atorvastatin (LIPITOR) 10 MG tablet Take 10 mg by mouth daily at 6 PM.     . azelastine (ASTELIN) 0.1 % nasal spray Place into both nostrils 2 (two) times daily. Use in each nostril as directed    . B Complex-C (SUPER B COMPLEX PO) Take 150 mg by mouth daily.     . budesonide-formoterol (SYMBICORT) 160-4.5 MCG/ACT inhaler Inhale 2 puffs into the lungs 2 (two) times daily as needed (shortness of breath). 1 Inhaler 12  . citalopram (CELEXA) 40 MG tablet Take 1 tablet (40 mg total) by mouth at bedtime. 90 tablet 1  . clopidogrel (PLAVIX) 75 MG tablet Take 1 tablet (75 mg total) by mouth daily. 90 tablet 3  . donepezil (ARICEPT) 10 MG tablet TAKE 1  TABLET BY MOUTH DAILY AT BEDTIME 90 tablet 1  . doxepin (SINEQUAN) 25 MG capsule TAKE 1 CAPSULE BY MOUTH DAILY AT BEDTIME 30 capsule 5  . fentaNYL (DURAGESIC - DOSED MCG/HR) 25 MCG/HR patch     . fish oil-omega-3 fatty acids 1000 MG capsule Take 1 g by mouth daily.     . fluticasone (FLONASE) 50 MCG/ACT nasal spray Place 1-2 sprays into both nostrils daily. 16 g 6  . guaiFENesin (MUCINEX) 600 MG 12 hr tablet Take 2 tablets (1,200 mg total) by mouth 2 (two) times daily. 14 tablet 0  . montelukast (SINGULAIR) 10 MG tablet Take 10 mg by mouth at bedtime.     . Multiple Vitamins-Minerals (MULTIVITAMIN PO) Take 1 tablet by mouth daily.    . nabumetone (RELAFEN) 750 MG tablet TAKE 1 TABLET BY MOUTH 2 TIMES DAILY AS NEEDED FOR PAIN. 180 tablet 0  . omeprazole (PRILOSEC) 20 MG capsule TAKE 1 CAPSULE TWICE DAILY FOR 10 DAYS, THEN DECREASE TO NORMAL ONCE DAILY DOSING 30 capsule 5  . oxyCODONE-acetaminophen (PERCOCET) 10-325 MG per tablet     . promethazine (PHENERGAN) 25 MG tablet TAKE 1 TABLET BY MOUTH EVERY 6 HOURS AS  NEEDED FOR NAUSEA 20 tablet 0  . VITAMIN E PO Take 1 capsule by mouth daily.      No current facility-administered medications for this visit.    Allergies  Allergen Reactions  . Latex Rash  . Penicillins Rash    Family History  Problem Relation Age of Onset  . Sudden death Brother   . Heart attack Brother   . Hypertension Brother   . Esophageal cancer Brother   . Heart attack Father   . Heart block Mother   . Stroke Brother   . Stroke Mother     Social History   Social History  . Marital Status: Married    Spouse Name: N/A  . Number of Children: N/A  . Years of Education: N/A   Occupational History  . retired     [employer]   Social History Main Topics  . Smoking status: Never Smoker   . Smokeless tobacco: Never Used  . Alcohol Use: 0.0 oz/week    0 Standard drinks or equivalent per week     Comment: grand Mariener - almost daily- 1/2 shot  . Drug Use: No    . Sexual Activity: Yes   Other Topics Concern  . Not on file   Social History Narrative   Married. Retired.       Constitutional: Denies fever, malaise, fatigue, headache or abrupt weight changes.  HEENT: Pt reports sore throat. Denies eye pain, eye redness, ear pain, ringing in the ears, wax buildup, runny nose, nasal congestion, bloody nose. Respiratory: Denies difficulty breathing, shortness of breath, cough or sputum production.   Cardiovascular: Denies chest pain, chest tightness, palpitations or swelling in the hands or feet.   No other specific complaints in a complete review of systems (except as listed in HPI above).  Objective:   Physical Exam   BP 154/88 mmHg  Pulse 81  Temp(Src) 97.5 F (36.4 C) (Oral)  Wt 185 lb (83.915 kg)  SpO2 97% Wt Readings from Last 3 Encounters:  06/09/15 185 lb (83.915 kg)  06/03/15 185 lb 3 oz (84 kg)  05/03/15 182 lb 5.1 oz (82.699 kg)    General: Appears her stated age,  in NAD. HEENT: Head: normal shape and size, no sinus tenderness noted; Eyes: sclera white, no icterus, conjunctiva pink; Ears: cerumen impaction bilaterally; Nose: mucosa pink and moist, septum midline; Throat/Mouth: Teeth present, mucosa pink and moist, + PND, no exudate, lesions or ulcerations noted.  Neck: No adenopathy noted. Cardiovascular: Normal rate and rhythm. S1,S2 noted.   Pulmonary/Chest: Normal effort and positive vesicular breath sounds. No respiratory distress. No wheezes, rales or ronchi noted.   BMET    Component Value Date/Time   NA 134* 03/22/2015 1528   K 4.3 03/22/2015 1528   CL 104 03/22/2015 1528   CO2 26 03/22/2015 1528   GLUCOSE 85 03/22/2015 1528   BUN 18 03/22/2015 1528   CREATININE 1.05 03/22/2015 1528   CREATININE 1.18* 05/22/2014 1211   CALCIUM 9.6 03/22/2015 1528   GFRNONAA 39* 10/23/2014 1212   GFRAA 45* 10/23/2014 1212    Lipid Panel     Component Value Date/Time   CHOL 142 04/15/2015 1008   TRIG 73.0 04/15/2015 1008    HDL 44.90 04/15/2015 1008   CHOLHDL 3 04/15/2015 1008   VLDL 14.6 04/15/2015 1008   LDLCALC 83 04/15/2015 1008    CBC    Component Value Date/Time   WBC 13.6* 06/03/2015 1416   RBC 4.91 06/03/2015 1416  HGB 11.4* 06/03/2015 1416   HCT 36.1 06/03/2015 1416   PLT 695* 06/03/2015 1416   MCV 73.6* 06/03/2015 1416   MCH 23.3* 06/03/2015 1416   MCHC 31.6* 06/03/2015 1416   RDW 16.9* 06/03/2015 1416   LYMPHSABS 2.8 06/03/2015 1416   MONOABS 1.3* 06/03/2015 1416   EOSABS 0.2 06/03/2015 1416   BASOSABS 0.0 06/03/2015 1416    Hgb A1C Lab Results  Component Value Date   HGBA1C 5.7* 10/13/2014        Assessment & Plan:   Sore throat secondary to post nasal drip:  Salt water gargles as needed Start taking Allegra at night, for the next week Continue Flonase and Singulair Return precautions given  RTC as needed or if symptoms persist or worsen

## 2015-06-10 ENCOUNTER — Ambulatory Visit: Payer: Medicare Other

## 2015-06-11 ENCOUNTER — Other Ambulatory Visit: Payer: Self-pay | Admitting: Family Medicine

## 2015-06-11 ENCOUNTER — Ambulatory Visit (INDEPENDENT_AMBULATORY_CARE_PROVIDER_SITE_OTHER): Payer: Medicare Other | Admitting: *Deleted

## 2015-06-11 ENCOUNTER — Inpatient Hospital Stay: Payer: Medicare Other

## 2015-06-11 VITALS — BP 134/82 | HR 80 | Temp 97.0°F

## 2015-06-11 DIAGNOSIS — D509 Iron deficiency anemia, unspecified: Secondary | ICD-10-CM | POA: Diagnosis not present

## 2015-06-11 DIAGNOSIS — I10 Essential (primary) hypertension: Secondary | ICD-10-CM | POA: Diagnosis not present

## 2015-06-11 DIAGNOSIS — I639 Cerebral infarction, unspecified: Secondary | ICD-10-CM

## 2015-06-11 DIAGNOSIS — D473 Essential (hemorrhagic) thrombocythemia: Secondary | ICD-10-CM | POA: Diagnosis not present

## 2015-06-11 DIAGNOSIS — I6322 Cerebral infarction due to unspecified occlusion or stenosis of basilar arteries: Secondary | ICD-10-CM

## 2015-06-11 DIAGNOSIS — K219 Gastro-esophageal reflux disease without esophagitis: Secondary | ICD-10-CM | POA: Diagnosis not present

## 2015-06-11 DIAGNOSIS — E785 Hyperlipidemia, unspecified: Secondary | ICD-10-CM | POA: Diagnosis not present

## 2015-06-11 DIAGNOSIS — D72829 Elevated white blood cell count, unspecified: Secondary | ICD-10-CM | POA: Diagnosis not present

## 2015-06-11 MED ORDER — SODIUM CHLORIDE 0.9 % IV SOLN
Freq: Once | INTRAVENOUS | Status: AC
  Administered 2015-06-11: 14:00:00 via INTRAVENOUS
  Filled 2015-06-11: qty 1000

## 2015-06-11 MED ORDER — SODIUM CHLORIDE 0.9 % IV SOLN
510.0000 mg | Freq: Once | INTRAVENOUS | Status: AC
  Administered 2015-06-11: 510 mg via INTRAVENOUS
  Filled 2015-06-11: qty 17

## 2015-06-11 NOTE — Telephone Encounter (Signed)
Sent. Thanks.   

## 2015-06-11 NOTE — Telephone Encounter (Signed)
Last refilled 05/26/15. Last office visit 04/22/15. Ok to refill?

## 2015-06-15 DIAGNOSIS — J3089 Other allergic rhinitis: Secondary | ICD-10-CM | POA: Diagnosis not present

## 2015-06-15 DIAGNOSIS — J3081 Allergic rhinitis due to animal (cat) (dog) hair and dander: Secondary | ICD-10-CM | POA: Diagnosis not present

## 2015-06-16 ENCOUNTER — Encounter: Payer: Self-pay | Admitting: Internal Medicine

## 2015-06-16 NOTE — Progress Notes (Signed)
Loop recorder 

## 2015-06-22 ENCOUNTER — Emergency Department (HOSPITAL_COMMUNITY): Payer: Medicare Other

## 2015-06-22 ENCOUNTER — Inpatient Hospital Stay (HOSPITAL_COMMUNITY): Payer: Medicare Other

## 2015-06-22 ENCOUNTER — Inpatient Hospital Stay (HOSPITAL_COMMUNITY)
Admission: EM | Admit: 2015-06-22 | Discharge: 2015-06-29 | DRG: 641 | Disposition: A | Discharge status: Skilled Nursing Facility | Payer: Medicare Other | Attending: Internal Medicine | Admitting: Internal Medicine

## 2015-06-22 ENCOUNTER — Encounter (HOSPITAL_COMMUNITY): Payer: Self-pay | Admitting: Emergency Medicine

## 2015-06-22 DIAGNOSIS — F1920 Other psychoactive substance dependence, uncomplicated: Secondary | ICD-10-CM | POA: Diagnosis not present

## 2015-06-22 DIAGNOSIS — F1110 Opioid abuse, uncomplicated: Secondary | ICD-10-CM | POA: Diagnosis not present

## 2015-06-22 DIAGNOSIS — F329 Major depressive disorder, single episode, unspecified: Secondary | ICD-10-CM | POA: Diagnosis present

## 2015-06-22 DIAGNOSIS — S0012XA Contusion of left eyelid and periocular area, initial encounter: Secondary | ICD-10-CM | POA: Diagnosis present

## 2015-06-22 DIAGNOSIS — Z452 Encounter for adjustment and management of vascular access device: Secondary | ICD-10-CM

## 2015-06-22 DIAGNOSIS — F1027 Alcohol dependence with alcohol-induced persisting dementia: Secondary | ICD-10-CM | POA: Diagnosis present

## 2015-06-22 DIAGNOSIS — Z96611 Presence of right artificial shoulder joint: Secondary | ICD-10-CM | POA: Diagnosis not present

## 2015-06-22 DIAGNOSIS — J90 Pleural effusion, not elsewhere classified: Secondary | ICD-10-CM | POA: Diagnosis not present

## 2015-06-22 DIAGNOSIS — E871 Hypo-osmolality and hyponatremia: Principal | ICD-10-CM | POA: Diagnosis present

## 2015-06-22 DIAGNOSIS — M25572 Pain in left ankle and joints of left foot: Secondary | ICD-10-CM | POA: Diagnosis not present

## 2015-06-22 DIAGNOSIS — Z96643 Presence of artificial hip joint, bilateral: Secondary | ICD-10-CM | POA: Diagnosis not present

## 2015-06-22 DIAGNOSIS — F1123 Opioid dependence with withdrawal: Secondary | ICD-10-CM | POA: Diagnosis not present

## 2015-06-22 DIAGNOSIS — I517 Cardiomegaly: Secondary | ICD-10-CM | POA: Diagnosis not present

## 2015-06-22 DIAGNOSIS — R339 Retention of urine, unspecified: Secondary | ICD-10-CM | POA: Diagnosis present

## 2015-06-22 DIAGNOSIS — W06XXXA Fall from bed, initial encounter: Secondary | ICD-10-CM | POA: Diagnosis present

## 2015-06-22 DIAGNOSIS — D509 Iron deficiency anemia, unspecified: Secondary | ICD-10-CM | POA: Diagnosis present

## 2015-06-22 DIAGNOSIS — N390 Urinary tract infection, site not specified: Secondary | ICD-10-CM | POA: Diagnosis present

## 2015-06-22 DIAGNOSIS — R0600 Dyspnea, unspecified: Secondary | ICD-10-CM | POA: Diagnosis not present

## 2015-06-22 DIAGNOSIS — E876 Hypokalemia: Secondary | ICD-10-CM | POA: Diagnosis not present

## 2015-06-22 DIAGNOSIS — S0512XA Contusion of eyeball and orbital tissues, left eye, initial encounter: Secondary | ICD-10-CM | POA: Diagnosis not present

## 2015-06-22 DIAGNOSIS — K920 Hematemesis: Secondary | ICD-10-CM | POA: Diagnosis not present

## 2015-06-22 DIAGNOSIS — R4182 Altered mental status, unspecified: Secondary | ICD-10-CM | POA: Diagnosis present

## 2015-06-22 DIAGNOSIS — Z7902 Long term (current) use of antithrombotics/antiplatelets: Secondary | ICD-10-CM

## 2015-06-22 DIAGNOSIS — E785 Hyperlipidemia, unspecified: Secondary | ICD-10-CM | POA: Diagnosis not present

## 2015-06-22 DIAGNOSIS — B964 Proteus (mirabilis) (morganii) as the cause of diseases classified elsewhere: Secondary | ICD-10-CM | POA: Diagnosis not present

## 2015-06-22 DIAGNOSIS — S0093XA Contusion of unspecified part of head, initial encounter: Secondary | ICD-10-CM | POA: Diagnosis not present

## 2015-06-22 DIAGNOSIS — F1010 Alcohol abuse, uncomplicated: Secondary | ICD-10-CM | POA: Diagnosis present

## 2015-06-22 DIAGNOSIS — Z8673 Personal history of transient ischemic attack (TIA), and cerebral infarction without residual deficits: Secondary | ICD-10-CM | POA: Diagnosis not present

## 2015-06-22 DIAGNOSIS — K219 Gastro-esophageal reflux disease without esophagitis: Secondary | ICD-10-CM | POA: Diagnosis not present

## 2015-06-22 DIAGNOSIS — R404 Transient alteration of awareness: Secondary | ICD-10-CM

## 2015-06-22 DIAGNOSIS — W19XXXA Unspecified fall, initial encounter: Secondary | ICD-10-CM | POA: Diagnosis present

## 2015-06-22 DIAGNOSIS — I10 Essential (primary) hypertension: Secondary | ICD-10-CM | POA: Diagnosis not present

## 2015-06-22 DIAGNOSIS — S0083XA Contusion of other part of head, initial encounter: Secondary | ICD-10-CM | POA: Diagnosis not present

## 2015-06-22 DIAGNOSIS — R401 Stupor: Secondary | ICD-10-CM | POA: Diagnosis not present

## 2015-06-22 DIAGNOSIS — Z95828 Presence of other vascular implants and grafts: Secondary | ICD-10-CM

## 2015-06-22 DIAGNOSIS — F32A Depression, unspecified: Secondary | ICD-10-CM | POA: Diagnosis present

## 2015-06-22 DIAGNOSIS — F1097 Alcohol use, unspecified with alcohol-induced persisting dementia: Secondary | ICD-10-CM | POA: Diagnosis present

## 2015-06-22 DIAGNOSIS — F1310 Sedative, hypnotic or anxiolytic abuse, uncomplicated: Secondary | ICD-10-CM | POA: Diagnosis not present

## 2015-06-22 DIAGNOSIS — S50312A Abrasion of left elbow, initial encounter: Secondary | ICD-10-CM | POA: Diagnosis not present

## 2015-06-22 DIAGNOSIS — K922 Gastrointestinal hemorrhage, unspecified: Secondary | ICD-10-CM | POA: Diagnosis present

## 2015-06-22 DIAGNOSIS — R296 Repeated falls: Secondary | ICD-10-CM | POA: Diagnosis present

## 2015-06-22 DIAGNOSIS — F411 Generalized anxiety disorder: Secondary | ICD-10-CM | POA: Diagnosis present

## 2015-06-22 DIAGNOSIS — R531 Weakness: Secondary | ICD-10-CM | POA: Diagnosis not present

## 2015-06-22 DIAGNOSIS — R410 Disorientation, unspecified: Secondary | ICD-10-CM | POA: Diagnosis not present

## 2015-06-22 LAB — CBC WITH DIFFERENTIAL/PLATELET
BASOS ABS: 0 10*3/uL (ref 0.0–0.1)
BASOS PCT: 0 % (ref 0–1)
EOS ABS: 0.5 10*3/uL (ref 0.0–0.7)
EOS PCT: 5 % (ref 0–5)
HEMATOCRIT: 32.9 % — AB (ref 36.0–46.0)
Hemoglobin: 11 g/dL — ABNORMAL LOW (ref 12.0–15.0)
Lymphocytes Relative: 16 % (ref 12–46)
Lymphs Abs: 1.6 10*3/uL (ref 0.7–4.0)
MCH: 24 pg — ABNORMAL LOW (ref 26.0–34.0)
MCHC: 33.4 g/dL (ref 30.0–36.0)
MCV: 71.8 fL — ABNORMAL LOW (ref 78.0–100.0)
MONO ABS: 0.8 10*3/uL (ref 0.1–1.0)
MONOS PCT: 8 % (ref 3–12)
NEUTROS ABS: 7 10*3/uL (ref 1.7–7.7)
Neutrophils Relative %: 71 % (ref 43–77)
PLATELETS: 454 10*3/uL — AB (ref 150–400)
RBC: 4.58 MIL/uL (ref 3.87–5.11)
RDW: 16.9 % — AB (ref 11.5–15.5)
WBC: 9.9 10*3/uL (ref 4.0–10.5)

## 2015-06-22 LAB — OSMOLALITY, URINE: OSMOLALITY UR: 126 mosm/kg — AB (ref 390–1090)

## 2015-06-22 LAB — COMPREHENSIVE METABOLIC PANEL
ALBUMIN: 3.3 g/dL — AB (ref 3.5–5.0)
ALK PHOS: 77 U/L (ref 38–126)
ALT: 33 U/L (ref 14–54)
ANION GAP: 9 (ref 5–15)
AST: 75 U/L — ABNORMAL HIGH (ref 15–41)
BILIRUBIN TOTAL: 0.8 mg/dL (ref 0.3–1.2)
BUN: 6 mg/dL (ref 6–20)
CALCIUM: 9.1 mg/dL (ref 8.9–10.3)
CO2: 26 mmol/L (ref 22–32)
CREATININE: 0.81 mg/dL (ref 0.44–1.00)
Chloride: 77 mmol/L — ABNORMAL LOW (ref 101–111)
GFR calc Af Amer: >60 mL/min (ref >60)
GFR calc non Af Amer: >60 mL/min (ref >60)
GLUCOSE: 86 mg/dL (ref 65–99)
Potassium: 3.7 mmol/L (ref 3.5–5.1)
Sodium: 112 mmol/L — CL (ref 135–145)
TOTAL PROTEIN: 6.1 g/dL — AB (ref 6.5–8.1)

## 2015-06-22 LAB — URINALYSIS, ROUTINE W REFLEX MICROSCOPIC
BILIRUBIN URINE: NEGATIVE
Glucose, UA: NEGATIVE mg/dL
HGB URINE DIPSTICK: NEGATIVE
KETONES UR: NEGATIVE mg/dL
NITRITE: NEGATIVE
PROTEIN: NEGATIVE mg/dL
Specific Gravity, Urine: 1.003 — ABNORMAL LOW (ref 1.005–1.030)
UROBILINOGEN UA: 1 mg/dL (ref 0.0–1.0)
pH: 7 (ref 5.0–8.0)

## 2015-06-22 LAB — BASIC METABOLIC PANEL
ANION GAP: 6 (ref 5–15)
BUN: 6 mg/dL (ref 6–20)
CALCIUM: 8.7 mg/dL — AB (ref 8.9–10.3)
CO2: 28 mmol/L (ref 22–32)
Chloride: 89 mmol/L — ABNORMAL LOW (ref 101–111)
Creatinine, Ser: 0.79 mg/dL (ref 0.44–1.00)
Glucose, Bld: 91 mg/dL (ref 65–99)
Potassium: 3.7 mmol/L (ref 3.5–5.1)
Sodium: 123 mmol/L — ABNORMAL LOW (ref 135–145)

## 2015-06-22 LAB — OSMOLALITY: Osmolality: 234 mOsm/kg — ABNORMAL LOW (ref 275–300)

## 2015-06-22 LAB — CHLORIDE, URINE, RANDOM: CHLORIDE URINE: 24 mmol/L

## 2015-06-22 LAB — SODIUM, URINE, RANDOM: SODIUM UR: 28 mmol/L

## 2015-06-22 LAB — URINE MICROSCOPIC-ADD ON

## 2015-06-22 LAB — MRSA PCR SCREENING: MRSA by PCR: NEGATIVE

## 2015-06-22 MED ORDER — OXYCODONE-ACETAMINOPHEN 5-325 MG PO TABS: 1.0000 | ORAL_TABLET | ORAL | Status: DC | PRN

## 2015-06-22 MED ORDER — DONEPEZIL HCL 10 MG PO TABS
10.0000 mg | ORAL_TABLET | Freq: Every day | ORAL | Status: DC
  Administered 2015-06-22: 10 mg via ORAL
  Filled 2015-06-22 (×4): qty 1

## 2015-06-22 MED ORDER — ASPIRIN 81 MG PO CHEW
324.0000 mg | CHEWABLE_TABLET | ORAL | Status: AC
  Administered 2015-06-22: 324 mg via ORAL
  Filled 2015-06-22: qty 4

## 2015-06-22 MED ORDER — NABUMETONE 750 MG PO TABS
750.0000 mg | ORAL_TABLET | Freq: Two times a day (BID) | ORAL | Status: DC
  Administered 2015-06-23: 750 mg via ORAL
  Filled 2015-06-22 (×4): qty 1

## 2015-06-22 MED ORDER — CLOPIDOGREL BISULFATE 75 MG PO TABS
75.0000 mg | ORAL_TABLET | Freq: Every day | ORAL | Status: DC
  Administered 2015-06-22 – 2015-06-23 (×2): 75 mg via ORAL
  Filled 2015-06-22 (×3): qty 1

## 2015-06-22 MED ORDER — MONTELUKAST SODIUM 10 MG PO TABS
10.0000 mg | ORAL_TABLET | Freq: Every day | ORAL | Status: DC
  Administered 2015-06-22: 10 mg via ORAL
  Filled 2015-06-22 (×4): qty 1

## 2015-06-22 MED ORDER — LIDOCAINE HCL (PF) 1 % IJ SOLN: 30.0000 mL | Freq: Once | INTRAMUSCULAR | Status: DC

## 2015-06-22 MED ORDER — CITALOPRAM HYDROBROMIDE 40 MG PO TABS
40.0000 mg | ORAL_TABLET | Freq: Every day | ORAL | Status: DC
  Administered 2015-06-22: 40 mg via ORAL
  Filled 2015-06-22 (×3): qty 1

## 2015-06-22 MED ORDER — PANTOPRAZOLE SODIUM 40 MG PO TBEC
40.0000 mg | DELAYED_RELEASE_TABLET | Freq: Every day | ORAL | Status: DC
  Administered 2015-06-23: 40 mg via ORAL

## 2015-06-22 MED ORDER — SODIUM CHLORIDE 0.9 % IV SOLN: Freq: Once | INTRAVENOUS | Status: DC

## 2015-06-22 MED ORDER — OXYCODONE HCL 5 MG PO TABS: 5.0000 mg | ORAL_TABLET | ORAL | Status: DC | PRN

## 2015-06-22 MED ORDER — SODIUM CHLORIDE 3 % IV SOLN
INTRAVENOUS | Status: DC
Start: 2015-06-22 — End: 2015-06-22
  Administered 2015-06-22: 30 mL/h via INTRAVENOUS
  Filled 2015-06-22 (×2): qty 500

## 2015-06-22 MED ORDER — ONDANSETRON HCL 4 MG/2ML IJ SOLN
4.0000 mg | Freq: Four times a day (QID) | INTRAMUSCULAR | Status: DC | PRN
  Administered 2015-06-23 – 2015-06-24 (×2): 4 mg via INTRAVENOUS
  Filled 2015-06-22 (×2): qty 2

## 2015-06-22 MED ORDER — FENTANYL 25 MCG/HR TD PT72: 25.0000 ug | MEDICATED_PATCH | TRANSDERMAL | Status: DC

## 2015-06-22 MED ORDER — FLUTICASONE PROPIONATE 50 MCG/ACT NA SUSP
1.0000 | Freq: Every day | NASAL | Status: DC | PRN
  Filled 2015-06-22: qty 16

## 2015-06-22 MED ORDER — OXYCODONE-ACETAMINOPHEN 10-325 MG PO TABS: 1.0000 | ORAL_TABLET | ORAL | Status: DC | PRN

## 2015-06-22 MED ORDER — ALPRAZOLAM 0.5 MG PO TABS
0.5000 mg | ORAL_TABLET | Freq: Three times a day (TID) | ORAL | Status: DC | PRN
  Administered 2015-06-22 – 2015-06-23 (×2): 0.5 mg via ORAL
  Filled 2015-06-22 (×2): qty 1

## 2015-06-22 MED ORDER — BUDESONIDE-FORMOTEROL FUMARATE 160-4.5 MCG/ACT IN AERO
2.0000 | INHALATION_SPRAY | Freq: Two times a day (BID) | RESPIRATORY_TRACT | Status: DC | PRN
  Filled 2015-06-22: qty 6

## 2015-06-22 MED ORDER — SODIUM CHLORIDE 0.9 % IV SOLN
Freq: Once | INTRAVENOUS | Status: AC
  Administered 2015-06-22: 13:00:00 via INTRAVENOUS

## 2015-06-22 MED ORDER — DOXEPIN HCL 25 MG PO CAPS
25.0000 mg | ORAL_CAPSULE | Freq: Every day | ORAL | Status: DC
  Administered 2015-06-22: 25 mg via ORAL
  Filled 2015-06-22 (×4): qty 1

## 2015-06-22 MED ORDER — ASPIRIN 300 MG RE SUPP
300.0000 mg | RECTAL | Status: AC
  Filled 2015-06-22: qty 1

## 2015-06-22 MED ORDER — ATORVASTATIN CALCIUM 10 MG PO TABS
10.0000 mg | ORAL_TABLET | Freq: Every day | ORAL | Status: DC
  Administered 2015-06-23: 10 mg via ORAL
  Filled 2015-06-22: qty 1

## 2015-06-22 MED ORDER — PROMETHAZINE HCL 25 MG PO TABS: 25.0000 mg | ORAL_TABLET | Freq: Four times a day (QID) | ORAL | Status: DC | PRN

## 2015-06-22 NOTE — ED Notes (Signed)
PA Cartner at bedside with the patient and family.

## 2015-06-22 NOTE — ED Notes (Addendum)
Patient coming from home with c/o of falling twice last night on to a hardwood surface from bed, with the second fall occuring around 10:00pm.  Patient has a hematoma to the left temporal and bruising with swelling to the left eye.  Bruising to the left arm and shoulder, bilateral knees and left ankle.  Patients husband per the daughter at bedside does not believe the patient had LOC.  Fentanyl patch present on the patient.  History of dementia.  Implanted heart monitor present per the daughter.

## 2015-06-22 NOTE — ED Notes (Signed)
Pharmacy called to send hypertonic solution.

## 2015-06-22 NOTE — Clinical Social Work Note (Signed)
Clinical Social Work Assessment  Patient Details  Name: Aimee Jones MRN: [id number] Date of Birth: 1946/06/06  Date of referral:  06/22/15               Reason for consult:  Care Management Concerns, Community Resources                Permission sought to share information with:  Case Manager, Family Supports Permission granted to share information::  Yes, Verbal Permission Granted  Name::        Agency::  Solicitor and Seth Bake (son and daughter)  Relationship::     Contact Information:     Housing/Transportation Living arrangements for the past 2 months:  Single Family Home Source of Information:  Medical Team, Adult Children Patient Interpreter Needed:  None Criminal Activity/Legal Involvement Pertinent to Current Situation/Hospitalization:  No - Comment as needed Significant Relationships:  Adult Children, Spouse Lives with:  Spouse Do you feel safe going back to the place where you live?  Yes (With assistance/community referrals) Need for family participation in patient care:  Yes (Comment) (patient has known Dementia)  Care giving concerns:  Patient's son and daughter at the bedside reporting needs for respite and additional help in the home. Family reports patient lives with her husband and son and daughter are out of town, but readily available and come weekly to help support their father. Reports patient spouse is burnt out and exhausted with caring for patient as her main issues revolve around Dementia.  Patient is independent with all ADLs at home and family is interested in private duty sitters or adult day center for additional support and help. They have discussed with PCP however limited is being done to obtain support.  Family is not interested at this time for SNF or ALF as they want parents together and more interested in assistance at the home.   Social Worker assessment / plan:  Discussed plan with MD and MD reports patient being admitted.  Information all given to  daughter an son including:  [place], [facility], Magazine features editor List. Family declined SNF list and ALF list at this time.   Other option would be form Home Health, however while in ED patient does not qualify or have needs for Home health.  Patient already has shower chair, potty chair and a walker (that she does not use). Home Health may be an option after patient admitted if needs arise.    Employment status:  Retired Health visitor, Managed Care PT Recommendations:  Not assessed at this time Information / Referral to community resources:  Adult Herbalist, Other (Comment Required) (private duty Advertising copywriter)  Patient/Family's Response to care:  Agreeable to plan and appreciative  Patient/Family's Understanding of and Emotional Response to Diagnosis, Current Treatment, and Prognosis:  Family able to understand reason for admission and agreeable for community resources.  All information explained and daughter voices appreciation and hopefulness to get assistance at home.  Emotional Assessment Appearance:  Appears stated age Attitude/Demeanor/Rapport:  Other (resting calmly in bed) Affect (typically observed):  Quiet, Calm, Happy Orientation:  Oriented to Self Alcohol / Substance use:  Not Applicable Psych involvement (Current and /or in the community):  No (Comment)  Discharge Needs  Concerns to be addressed:  Denies Needs/Concerns at this time Readmission within the last 30 days:  No Current discharge risk:  None Barriers to Discharge:  Continued Medical Work up (patient due to be admitted to inpatient floor)  [place], LCSW 06/22/2015, 12:43 PM

## 2015-06-22 NOTE — ED Notes (Signed)
Critical Sodium 112 per [facility].   PA Eulas Post notified via phone.

## 2015-06-22 NOTE — ED Notes (Signed)
Pt arrived to C25 via stretcher. Spouse w/pt.

## 2015-06-22 NOTE — H&P (Signed)
History and Physical  Aimee Jones XBL:[id number] DOB: Apr 30, 1946 DOA: 06/22/2015  Referring physician: Dr Betsey Holiday, ED physician PCP: [name], MD   Chief Complaint: Confusion, fall  HPI: Aimee Jones is a 69 y.o. female  With a history of dementia, chronic pain on opiates, hyperlipidemia, hypertension, history of stroke, GERD. Patient presents to the emergency department with increased confusion over the past couple of days who had 2 falls yesterday evening. The fall occurred as will the date. She denies striking any objects but fell on the floor. Due to the extent of the hematoma to the left temporal and swelling of the left eye, the patient was brought to the emergency room for evaluation. No apparent loss of consciousness. Although the patient has dementia, and the son notes that she has been slightly more confused. No pH and a provoking factors noted.  In reviewing the chart, it appears that she had an episode of hyponatremia approximately 1 year ago with a sodium level of 126.  Review of Systems:   Pt complains of bruises and swelling from fall.  Pt denies any fevers, chills, nausea, vomiting, diarrhea, constipation, abdominal pain, shortness of breath, dyspnea on exertion, orthopnea, cough, wheezing, palpitations, headache, vision changes, lightheadedness, dizziness, diarrhea, constipation, melena, rectal bleeding.  Review of systems are otherwise negative  Past Medical History  Diagnosis Date  . Hyperlipidemia   . ETOH abuse   . Allergy   . Diverticulosis   . Hypertension   . Depression   . Asthma   . GERD (gastroesophageal reflux disease)   . Normal 24 hour ambulatory pH monitoring study     good acid suppression  . Dementia   . Shortness of breath   . Anxiety     panic attacks- on occas.   . Arthritis     osteoporosis - especially hips.   . Stroke   . Memory loss   . Headache   . Confusion    Past Surgical History  Procedure Laterality Date  .  Dobutamine stress echo  07/09/2001    normal  . Esophagogastroduodenoscopy  06/11/2006    normal  . Abdominal hysterectomy    . Total hip arthroplasty      left 1991/right 2008  . Nasal sinus surgery  1990  . Eye surgery      cataracts removed, IOL- both eyes   . Breast surgery      3x- biposies   . Shoulder arthroscopy Left   . Reverse shoulder arthroplasty Right 08/13/2014    Procedure: REVERSE SHOULDER ARTHROPLASTY;  Surgeon: Marin Shutter, MD;  Location: [facility];  Service: Orthopedics;  Laterality: Right;  interscalene block  . Joint replacement      bilateral hips  . Loop recorder implant N/A 10/14/2014    Procedure: LOOP RECORDER IMPLANT;  Surgeon: Evans Lance, MD;  Location: [facility] CATH LAB;  Service: Cardiovascular;  Laterality: N/A;  . Tee without cardioversion N/A 10/14/2014    Procedure: TRANSESOPHAGEAL ECHOCARDIOGRAM (TEE);  Surgeon: Sueanne Margarita, MD;  Location: [facility] ENDOSCOPY;  Service: Cardiovascular;  Laterality: N/A;   Social History:  reports that she has never smoked. She has never used smokeless tobacco. She reports that she drinks alcohol. She reports that she does not use illicit drugs. Patient lives at home & is able to participate in activities of daily living with assistance  Allergies  Allergen Reactions  . Latex Rash  . Penicillins Rash    Family History  Problem Relation Age  of Onset  . Sudden death Brother   . Heart attack Brother   . Hypertension Brother   . Esophageal cancer Brother   . Heart attack Father   . Heart block Mother   . Stroke Brother   . Stroke Mother      Prior to Admission medications   Medication Sig Start Date End Date Taking? Authorizing Provider  ALPRAZolam Duanne Moron) 0.5 MG tablet TAKE 1 TABLET 3 TIMES A DAY AS NEEDED 05/16/15   Tonia Ghent, MD  Aspirin-Salicylamide-Caffeine [facility] HEADACHE POWDER PO) Take 2 Doses by mouth daily.    Historical Provider, MD  atorvastatin (LIPITOR) 10 MG tablet Take 10 mg by mouth daily at 6 PM.      Historical Provider, MD  azelastine (ASTELIN) 0.1 % nasal spray Place into both nostrils 2 (two) times daily. Use in each nostril as directed    Historical Provider, MD  B Complex-C (SUPER B COMPLEX PO) Take 150 mg by mouth daily.     Historical Provider, MD  budesonide-formoterol (SYMBICORT) 160-4.5 MCG/ACT inhaler Inhale 2 puffs into the lungs 2 (two) times daily as needed (shortness of breath). 08/12/14   Tonia Ghent, MD  citalopram (CELEXA) 40 MG tablet Take 1 tablet (40 mg total) by mouth at bedtime. 04/07/15   Tonia Ghent, MD  clopidogrel (PLAVIX) 75 MG tablet Take 1 tablet (75 mg total) by mouth daily. 12/11/14   Rosalin Hawking, MD  donepezil (ARICEPT) 10 MG tablet TAKE 1 TABLET BY MOUTH DAILY AT BEDTIME 12/11/14   Tonia Ghent, MD  doxepin [facility]) 25 MG capsule TAKE 1 CAPSULE BY MOUTH DAILY AT BEDTIME 05/10/15   Tonia Ghent, MD  fentaNYL (DURAGESIC - DOSED MCG/HR) 25 MCG/HR patch  03/16/15   Historical Provider, MD  fish oil-omega-3 fatty acids 1000 MG capsule Take 1 g by mouth daily.     Historical Provider, MD  fluticasone (FLONASE) 50 MCG/ACT nasal spray Place 1-2 sprays into both nostrils daily. 03/16/15   Tonia Ghent, MD  guaiFENesin (MUCINEX) 600 MG 12 hr tablet Take 2 tablets (1,200 mg total) by mouth 2 (two) times daily. 10/14/14   Maryann Mikhail, DO  montelukast (SINGULAIR) 10 MG tablet Take 10 mg by mouth at bedtime.  02/21/13   Historical Provider, MD  Multiple Vitamins-Minerals (MULTIVITAMIN PO) Take 1 tablet by mouth daily.    Historical Provider, MD  nabumetone (RELAFEN) 750 MG tablet TAKE 1 TABLET BY MOUTH 2 TIMES DAILY AS NEEDED FOR PAIN. 05/26/15   Tonia Ghent, MD  omeprazole (PRILOSEC) 20 MG capsule TAKE 1 CAPSULE TWICE DAILY FOR 10 DAYS, THEN DECREASE TO NORMAL ONCE DAILY DOSING 05/04/15   Tonia Ghent, MD  oxyCODONE-acetaminophen (PERCOCET) 10-325 MG per tablet  05/16/15   Historical Provider, MD  promethazine (PHENERGAN) 25 MG tablet TAKE 1 TABLET BY  MOUTH EVERY 6 HOURS AS NEEDED FOR NAUSEA 06/11/15   Tonia Ghent, MD  VITAMIN E PO Take 1 capsule by mouth daily.     Historical Provider, MD    Physical Exam: BP 132/77 mmHg  Pulse 75  Temp(Src) 97.8 F (36.6 C) (Oral)  Resp 17  Ht 5\' 6"  (1.676 m)  Wt 83.915 kg (185 lb)  BMI 29.87 kg/m2  SpO2 99%  General: Elderly Caucasian female. Awake and alert and oriented x3. No acute cardiopulmonary distress.  Eyes: Pupil equal, round, reactive to light. Extraocular muscles are intact. Sclerae anicteric and noninjected. Extensive ecchymosis surrounding the left orbit with a  hematoma on the left temporal area. ENT: Moist mucosal membranes. No mucosal lesions. Teeth in moderate repair  Neck: Neck supple without lymphadenopathy. No carotid bruits. No masses palpated.  Cardiovascular: Regular rate with normal S1-S2 sounds. No murmurs, rubs, gallops auscultated. No JVD.  Respiratory: Good respiratory effort with no wheezes, rales, rhonchi. Lungs clear to auscultation bilaterally.  Abdomen: Soft, nontender, nondistended. Active bowel sounds. No masses or hepatosplenomegaly  Skin: Dry, warm to touch. 2+ dorsalis pedis and radial pulses. Musculoskeletal: No calf or leg pain. All major joints not erythematous nontender.  Psychiatric: Intact judgment and insight.  Neurologic: No focal neurological deficits. Cranial nerves II through XII are grossly intact.           Labs on Admission:  Basic Metabolic Panel:  Recent Labs Lab 06/22/15 1132  NA 112*  K 3.7  CL 77*  CO2 26  GLUCOSE 86  BUN 6  CREATININE 0.81  CALCIUM 9.1   Liver Function Tests:  Recent Labs Lab 06/22/15 1132  AST 75*  ALT 33  ALKPHOS 77  BILITOT 0.8  PROT 6.1*  ALBUMIN 3.3*   No results for input(s): LIPASE, AMYLASE in the last 168 hours. No results for input(s): AMMONIA in the last 168 hours. CBC:  Recent Labs Lab 06/22/15 1132  WBC 9.9  NEUTROABS 7.0  HGB 11.0*  HCT 32.9*  MCV 71.8*  PLT 454*    Cardiac Enzymes: No results for input(s): CKTOTAL, CKMB, CKMBINDEX, TROPONINI in the last 168 hours.  BNP (last 3 results) No results for input(s): BNP in the last 8760 hours.  ProBNP (last 3 results) No results for input(s): PROBNP in the last 8760 hours.  CBG: No results for input(s): GLUCAP in the last 168 hours.  Radiological Exams on Admission: Ct Head Wo Contrast  06/22/2015   CLINICAL DATA:  Pain following fall  EXAM: CT HEAD WITHOUT CONTRAST  CT MAXILLOFACIAL WITHOUT CONTRAST  TECHNIQUE: Multidetector CT imaging of the head and maxillofacial structures were performed using the standard protocol without intravenous contrast. Multiplanar CT image reconstructions of the maxillofacial structures were also generated.  COMPARISON:  Head CT October 23, 2014  FINDINGS: CT HEAD FINDINGS  Moderate diffuse atrophy is stable. There is no intracranial mass, hemorrhage, extra-axial fluid collection, or midline shift . There is patchy small vessel disease in the centra semiovale bilaterally. Elsewhere gray-white compartments appear normal no acute infarct evident. Bony calvarium appears intact. Mastoid air cells are clear. There is marked soft tissue swelling over the left frontal bone and orbit. There is a focal soft tissue hematoma over the left frontal bone measuring 2.5 x 1.9 cm.  CT MAXILLOFACIAL FINDINGS  There is marked soft tissue swelling, preseptal, over the left orbit with swelling extending inferiorly to the mid upper face on the left and superiorly over the left frontal region. There is a hematoma overlying the left frontal bone superior to the left orbit.  No acute fracture or dislocation is evident. There is no intraorbital lesion on either side.  Paranasal sinuses are clear. Ostiomeatal unit complexes are patent bilaterally. There is a concha bullosa on each side, an anatomic variant. Nares are not obstructed. There is no air-fluid level. No bony destruction or expansion.  Salivary glands  appear normal. No adenopathy evident. There is probable cerumen in each external auditory canal.  IMPRESSION: CT head: Soft tissue hematoma over the left frontal bone measuring 2.5 x 1.9 cm. There is extensive soft tissue swelling in this area. Elsewhere, there is atrophy  with patchy periventricular small vessel disease. No acute infarct evident. No intracranial mass, hemorrhage, or extra-axial fluid collection. No acute calvarial fracture.  CT maxillofacial: Marked soft tissue swelling over the left face and frontal region with an acute hematoma over the left frontal bone superior to the left orbit. There is extensive preseptal soft tissue swelling over the left orbit. No postseptal intraorbital lesion seen on either side. No fracture or dislocation. Paranasal sinuses clear. Ostiomeatal unit complexes are patent bilaterally. Probable cerumen in each external auditory canal.   Electronically Signed   By: Lowella Grip III M.D.   On: 06/22/2015 10:47   Ct Maxillofacial Wo Cm  06/22/2015   CLINICAL DATA:  Pain following fall  EXAM: CT HEAD WITHOUT CONTRAST  CT MAXILLOFACIAL WITHOUT CONTRAST  TECHNIQUE: Multidetector CT imaging of the head and maxillofacial structures were performed using the standard protocol without intravenous contrast. Multiplanar CT image reconstructions of the maxillofacial structures were also generated.  COMPARISON:  Head CT October 23, 2014  FINDINGS: CT HEAD FINDINGS  Moderate diffuse atrophy is stable. There is no intracranial mass, hemorrhage, extra-axial fluid collection, or midline shift . There is patchy small vessel disease in the centra semiovale bilaterally. Elsewhere gray-white compartments appear normal no acute infarct evident. Bony calvarium appears intact. Mastoid air cells are clear. There is marked soft tissue swelling over the left frontal bone and orbit. There is a focal soft tissue hematoma over the left frontal bone measuring 2.5 x 1.9 cm.  CT MAXILLOFACIAL FINDINGS   There is marked soft tissue swelling, preseptal, over the left orbit with swelling extending inferiorly to the mid upper face on the left and superiorly over the left frontal region. There is a hematoma overlying the left frontal bone superior to the left orbit.  No acute fracture or dislocation is evident. There is no intraorbital lesion on either side.  Paranasal sinuses are clear. Ostiomeatal unit complexes are patent bilaterally. There is a concha bullosa on each side, an anatomic variant. Nares are not obstructed. There is no air-fluid level. No bony destruction or expansion.  Salivary glands appear normal. No adenopathy evident. There is probable cerumen in each external auditory canal.  IMPRESSION: CT head: Soft tissue hematoma over the left frontal bone measuring 2.5 x 1.9 cm. There is extensive soft tissue swelling in this area. Elsewhere, there is atrophy with patchy periventricular small vessel disease. No acute infarct evident. No intracranial mass, hemorrhage, or extra-axial fluid collection. No acute calvarial fracture.  CT maxillofacial: Marked soft tissue swelling over the left face and frontal region with an acute hematoma over the left frontal bone superior to the left orbit. There is extensive preseptal soft tissue swelling over the left orbit. No postseptal intraorbital lesion seen on either side. No fracture or dislocation. Paranasal sinuses clear. Ostiomeatal unit complexes are patent bilaterally. Probable cerumen in each external auditory canal.   Electronically Signed   By: Lowella Grip III M.D.   On: 06/22/2015 10:47    EKG: Independently reviewed. Sinus rhythm with rate of 70. Normal intervals. Q waves in V1 and V2 suggestive of old anterior infarct. No acute ST changes. Negative for STEMI  Assessment/Plan Present on Admission:  . Hyponatremia . Altered mental status . Fall  This patient was discussed with the ED physician, including pertinent vitals, physical exam findings,  labs, and imaging.  We also discussed care given by the ED provider.  #1 hyponatremia  Admit to stepdown  Uncertain etiology. The patient does not appear  dehydrated by labs or on exam. Will check serum osmolality, urine osmolality, urine sodium, urine chloride. Will check chest x-ray to rule out lung mass.  Due to altered mental status and gait alteration, will place the patient on hypertonic saline. We'll give 30 mL for 6 hours, then recheck her sodium level.  Continue telemetry  Metabolic panel in 6 hours, then tomorrow morning #2 altered mental status  As above #3 fall  Will keep the patient on bedrest for now. Will need physical therapy consult when ambulatory  DVT prophylaxis: Due to fall risk, will place SCDs and hold off on pharmacological prophylaxis  Consultants: None  Code Status: Full code  Family Communication: son in the room   Disposition Plan: Admit to stepdown   Truett Mainland, DO [facility] Pager [phone]

## 2015-06-22 NOTE — ED Provider Notes (Addendum)
Patient presented to the ER with injury from fall. Patient fell twice last night. Patient complaining of pain and swelling around her left eye.  Face to face Exam: HEENT - PERRLA, significant swelling and ecchymosis around left eye Lungs - CTAB Heart - RRR, no M/R/G Abd - S/NT/ND Neuro - alert, oriented x3  Plan: CT head and maxillofacial bones performed, no evidence of fracture or intracranial injury. Blood work reveals significant hyponatremia. Further discussion with the patient's daughter reveals that she has had problems with hyponatremia in the past. Patient will be admitted to the hospital on the medicine service for further treatment. Medicine requested central line for administration of hypertonic saline. Central line placed without difficulty by myself.  Procedure: Internal Jugular Central Line  Timeout immediately proceeding procedure.  The patient was placed in Trendelenburg position. Right neck region was prepped and draped in sterile fashion using chlorhexidine scrub. Anesthesia was achieved with 1% lidocaine. The internal jugular vein was identified with ultrasound guidance. The introducer needle was inserted through anesthetized skin and cannulation of internal jugular vein was visualized with the ultrasound. Venous blood was withdrawn. Syringe was removed and a guidewire was advanced into the introducer needle. A small incision was made at the skin surface with a scalpel and the introducer needle was exchanged for a dilator over the guidewire. After appropriate dilation was obtained, the dilator was exchanged over the wire for an 7 French, triple-lumen, central venous catheter. The wire was removed and the catheter was sutured in place at 15 cm. The patient tolerated the procedure without any hemodynamic compromise. At time of procedure completion, all ports aspirated and flushed properly. Post-procedure x-ray shows the tip of the catheter within the SVC. No pneumothorax.  CRITICAL  CARE Performed by: Orpah Greek   Total critical care time: 64min  Critical care time was exclusive of separately billable procedures and treating other patients.  Critical care was necessary to treat or prevent imminent or life-threatening deterioration.  Critical care was time spent personally by me on the following activities: development of treatment plan with patient and/or surrogate as well as nursing, discussions with consultants, evaluation of patient's response to treatment, examination of patient, obtaining history from patient or surrogate, ordering and performing treatments and interventions, ordering and review of laboratory studies, ordering and review of radiographic studies, pulse oximetry and re-evaluation of patient's condition.   Orpah Greek, MD 06/22/15 [place], MD 06/22/15 1438  Orpah Greek, MD 06/22/15 1438

## 2015-06-22 NOTE — ED Provider Notes (Addendum)
CSN: [id number]     Arrival date & time 06/22/15  0846 History   First MD Initiated Contact with Patient 06/22/15 [phone]     Chief Complaint  Patient presents with  . Head Injury     (Consider location/radiation/quality/duration/timing/severity/associated sxs/prior Treatment) HPI Aimee Jones is a 69 y.o. female with history of dementia, comes in for evaluation of head injury after a fall. Patient is accompanied by family at bedside contributing history of present illness. Family states the patient lives at home with her husband. Apparently patient fell twice last night, the first time unknown, the second time at approximately 10:15 PM. Patient fell forward landing on left shoulder and left face on to hardwood floors. Patient denies any LOC, numbness or weakness, chest pain, shortness of breath, nausea or vomiting, abdominal pain, fevers or chills, cough. Patient does have fentanyl patch in place for chronic neck pain.  Past Medical History  Diagnosis Date  . Hyperlipidemia   . ETOH abuse   . Allergy   . Diverticulosis   . Hypertension   . Depression   . Asthma   . GERD (gastroesophageal reflux disease)   . Normal 24 hour ambulatory pH monitoring study     good acid suppression  . Dementia   . Shortness of breath   . Anxiety     panic attacks- on occas.   . Arthritis     osteoporosis - especially hips.   . Stroke   . Memory loss   . Headache   . Confusion    Past Surgical History  Procedure Laterality Date  . Dobutamine stress echo  07/09/2001    normal  . Esophagogastroduodenoscopy  06/11/2006    normal  . Abdominal hysterectomy    . Total hip arthroplasty      left 1991/right 2008  . Nasal sinus surgery  1990  . Eye surgery      cataracts removed, IOL- both eyes   . Breast surgery      3x- biposies   . Shoulder arthroscopy Left   . Reverse shoulder arthroplasty Right 08/13/2014    Procedure: REVERSE SHOULDER ARTHROPLASTY;  Surgeon: Marin Shutter, MD;  Location:  [place];  Service: Orthopedics;  Laterality: Right;  interscalene block  . Joint replacement      bilateral hips  . Loop recorder implant N/A 10/14/2014    Procedure: LOOP RECORDER IMPLANT;  Surgeon: Evans Lance, MD;  Location: [facility] CATH LAB;  Service: Cardiovascular;  Laterality: N/A;  . Tee without cardioversion N/A 10/14/2014    Procedure: TRANSESOPHAGEAL ECHOCARDIOGRAM (TEE);  Surgeon: Sueanne Margarita, MD;  Location: [facility] ENDOSCOPY;  Service: Cardiovascular;  Laterality: N/A;   Family History  Problem Relation Age of Onset  . Sudden death Brother   . Heart attack Brother   . Hypertension Brother   . Esophageal cancer Brother   . Heart attack Father   . Heart block Mother   . Stroke Brother   . Stroke Mother    Social History  Substance Use Topics  . Smoking status: Never Smoker   . Smokeless tobacco: Never Used  . Alcohol Use: 0.0 oz/week    0 Standard drinks or equivalent per week     Comment: grand Mariener - almost daily- 1/2 shot   OB History    No data available     Review of Systems A 10 point review of systems was completed and was negative except for pertinent positives and negatives as mentioned in the  history of present illness     Allergies  Latex and Penicillins  Home Medications   Prior to Admission medications   Medication Sig Start Date End Date Taking? Authorizing Provider  ALPRAZolam Duanne Moron) 0.5 MG tablet TAKE 1 TABLET 3 TIMES A DAY AS NEEDED 05/16/15   Tonia Ghent, MD  Aspirin-Salicylamide-Caffeine [facility] HEADACHE POWDER PO) Take 2 Doses by mouth daily.    Historical Provider, MD  atorvastatin (LIPITOR) 10 MG tablet Take 10 mg by mouth daily at 6 PM.     Historical Provider, MD  azelastine (ASTELIN) 0.1 % nasal spray Place into both nostrils 2 (two) times daily. Use in each nostril as directed    Historical Provider, MD  B Complex-C (SUPER B COMPLEX PO) Take 150 mg by mouth daily.     Historical Provider, MD  budesonide-formoterol (SYMBICORT) 160-4.5  MCG/ACT inhaler Inhale 2 puffs into the lungs 2 (two) times daily as needed (shortness of breath). 08/12/14   Tonia Ghent, MD  citalopram (CELEXA) 40 MG tablet Take 1 tablet (40 mg total) by mouth at bedtime. 04/07/15   Tonia Ghent, MD  clopidogrel (PLAVIX) 75 MG tablet Take 1 tablet (75 mg total) by mouth daily. 12/11/14   Rosalin Hawking, MD  donepezil (ARICEPT) 10 MG tablet TAKE 1 TABLET BY MOUTH DAILY AT BEDTIME 12/11/14   Tonia Ghent, MD  doxepin [facility]) 25 MG capsule TAKE 1 CAPSULE BY MOUTH DAILY AT BEDTIME 05/10/15   Tonia Ghent, MD  fentaNYL (DURAGESIC - DOSED MCG/HR) 25 MCG/HR patch  03/16/15   Historical Provider, MD  fish oil-omega-3 fatty acids 1000 MG capsule Take 1 g by mouth daily.     Historical Provider, MD  fluticasone (FLONASE) 50 MCG/ACT nasal spray Place 1-2 sprays into both nostrils daily. 03/16/15   Tonia Ghent, MD  guaiFENesin (MUCINEX) 600 MG 12 hr tablet Take 2 tablets (1,200 mg total) by mouth 2 (two) times daily. 10/14/14   Maryann Mikhail, DO  montelukast (SINGULAIR) 10 MG tablet Take 10 mg by mouth at bedtime.  02/21/13   Historical Provider, MD  Multiple Vitamins-Minerals (MULTIVITAMIN PO) Take 1 tablet by mouth daily.    Historical Provider, MD  nabumetone (RELAFEN) 750 MG tablet TAKE 1 TABLET BY MOUTH 2 TIMES DAILY AS NEEDED FOR PAIN. 05/26/15   Tonia Ghent, MD  omeprazole (PRILOSEC) 20 MG capsule TAKE 1 CAPSULE TWICE DAILY FOR 10 DAYS, THEN DECREASE TO NORMAL ONCE DAILY DOSING 05/04/15   Tonia Ghent, MD  oxyCODONE-acetaminophen (PERCOCET) 10-325 MG per tablet  05/16/15   Historical Provider, MD  promethazine (PHENERGAN) 25 MG tablet TAKE 1 TABLET BY MOUTH EVERY 6 HOURS AS NEEDED FOR NAUSEA 06/11/15   Tonia Ghent, MD  VITAMIN E PO Take 1 capsule by mouth daily.     Historical Provider, MD   BP 127/66 mmHg  Pulse 74  Temp(Src) 97.8 F (36.6 C) (Oral)  Resp 12  Ht 5\' 6"  (1.676 m)  Wt 185 lb (83.915 kg)  BMI 29.87 kg/m2  SpO2 99% Physical Exam   Constitutional: She is oriented to person, place, and time. She appears well-developed and well-nourished.  HENT:  Head: Normocephalic.  Mouth/Throat: Oropharynx is clear and moist.  Patient with large frontal hematoma on left side. Left orbital hematoma as well, eyelids swollen shut. No pain with ocular movements. No other sign of calvarial fracture. No battle sign or raccoon eyes. TMs with bilateral cerumen impaction  Eyes: Conjunctivae are normal. Pupils are  equal, round, and reactive to light. Right eye exhibits no discharge. Left eye exhibits no discharge. No scleral icterus.  Neck: Normal range of motion. Neck supple.  Patient maintains full active range of motion of cervical spine with no bony tenderness.  Cardiovascular: Normal rate, regular rhythm and normal heart sounds.   Pulmonary/Chest: Effort normal and breath sounds normal. No respiratory distress. She has no wheezes. She has no rales.  Abdominal: Soft. There is no tenderness.  Musculoskeletal: She exhibits no tenderness.  Mild ecchymosis noted to proximal aspect of left humerus. No focal tenderness. Patient maintains full active range of motion of left shoulder and left upper extremity. Abrasion noted to left elbow. Ecchymosis and swelling noted to lateral aspect of left ankle. Patient maintains full active range of motion with no focal tenderness. Distal pulses intact. Otherwise full active range of motion of all 4 extremities  Neurological: She is alert and oriented to person, place, and time.  Cranial Nerves II-XII grossly intact. Moves all extremities without ataxia. Motor and sensation 5/5.  Skin: Skin is warm and dry. No rash noted.  Psychiatric: She has a normal mood and affect.  Nursing note and vitals reviewed.   ED Course  Procedures (including critical care time) Labs Review Labs Reviewed  COMPREHENSIVE METABOLIC PANEL - Abnormal; Notable for the following:    Sodium 112 (*)    Chloride 77 (*)    Total  Protein 6.1 (*)    Albumin 3.3 (*)    AST 75 (*)    All other components within normal limits  CBC WITH DIFFERENTIAL/PLATELET - Abnormal; Notable for the following:    Hemoglobin 11.0 (*)    HCT 32.9 (*)    MCV 71.8 (*)    MCH 24.0 (*)    RDW 16.9 (*)    Platelets 454 (*)    All other components within normal limits  URINALYSIS, ROUTINE W REFLEX MICROSCOPIC (NOT AT [facility]) - Abnormal; Notable for the following:    Specific Gravity, Urine 1.003 (*)    Leukocytes, UA TRACE (*)    All other components within normal limits  URINE MICROSCOPIC-ADD ON    Imaging Review Ct Head Wo Contrast  06/22/2015   CLINICAL DATA:  Pain following fall  EXAM: CT HEAD WITHOUT CONTRAST  CT MAXILLOFACIAL WITHOUT CONTRAST  TECHNIQUE: Multidetector CT imaging of the head and maxillofacial structures were performed using the standard protocol without intravenous contrast. Multiplanar CT image reconstructions of the maxillofacial structures were also generated.  COMPARISON:  Head CT October 23, 2014  FINDINGS: CT HEAD FINDINGS  Moderate diffuse atrophy is stable. There is no intracranial mass, hemorrhage, extra-axial fluid collection, or midline shift . There is patchy small vessel disease in the centra semiovale bilaterally. Elsewhere gray-white compartments appear normal no acute infarct evident. Bony calvarium appears intact. Mastoid air cells are clear. There is marked soft tissue swelling over the left frontal bone and orbit. There is a focal soft tissue hematoma over the left frontal bone measuring 2.5 x 1.9 cm.  CT MAXILLOFACIAL FINDINGS  There is marked soft tissue swelling, preseptal, over the left orbit with swelling extending inferiorly to the mid upper face on the left and superiorly over the left frontal region. There is a hematoma overlying the left frontal bone superior to the left orbit.  No acute fracture or dislocation is evident. There is no intraorbital lesion on either side.  Paranasal sinuses are clear.  Ostiomeatal unit complexes are patent bilaterally. There is a concha bullosa  on each side, an anatomic variant. Nares are not obstructed. There is no air-fluid level. No bony destruction or expansion.  Salivary glands appear normal. No adenopathy evident. There is probable cerumen in each external auditory canal.  IMPRESSION: CT head: Soft tissue hematoma over the left frontal bone measuring 2.5 x 1.9 cm. There is extensive soft tissue swelling in this area. Elsewhere, there is atrophy with patchy periventricular small vessel disease. No acute infarct evident. No intracranial mass, hemorrhage, or extra-axial fluid collection. No acute calvarial fracture.  CT maxillofacial: Marked soft tissue swelling over the left face and frontal region with an acute hematoma over the left frontal bone superior to the left orbit. There is extensive preseptal soft tissue swelling over the left orbit. No postseptal intraorbital lesion seen on either side. No fracture or dislocation. Paranasal sinuses clear. Ostiomeatal unit complexes are patent bilaterally. Probable cerumen in each external auditory canal.   Electronically Signed   By: Lowella Grip III M.D.   On: 06/22/2015 10:47   Ct Maxillofacial Wo Cm  06/22/2015   CLINICAL DATA:  Pain following fall  EXAM: CT HEAD WITHOUT CONTRAST  CT MAXILLOFACIAL WITHOUT CONTRAST  TECHNIQUE: Multidetector CT imaging of the head and maxillofacial structures were performed using the standard protocol without intravenous contrast. Multiplanar CT image reconstructions of the maxillofacial structures were also generated.  COMPARISON:  Head CT October 23, 2014  FINDINGS: CT HEAD FINDINGS  Moderate diffuse atrophy is stable. There is no intracranial mass, hemorrhage, extra-axial fluid collection, or midline shift . There is patchy small vessel disease in the centra semiovale bilaterally. Elsewhere gray-white compartments appear normal no acute infarct evident. Bony calvarium appears intact.  Mastoid air cells are clear. There is marked soft tissue swelling over the left frontal bone and orbit. There is a focal soft tissue hematoma over the left frontal bone measuring 2.5 x 1.9 cm.  CT MAXILLOFACIAL FINDINGS  There is marked soft tissue swelling, preseptal, over the left orbit with swelling extending inferiorly to the mid upper face on the left and superiorly over the left frontal region. There is a hematoma overlying the left frontal bone superior to the left orbit.  No acute fracture or dislocation is evident. There is no intraorbital lesion on either side.  Paranasal sinuses are clear. Ostiomeatal unit complexes are patent bilaterally. There is a concha bullosa on each side, an anatomic variant. Nares are not obstructed. There is no air-fluid level. No bony destruction or expansion.  Salivary glands appear normal. No adenopathy evident. There is probable cerumen in each external auditory canal.  IMPRESSION: CT head: Soft tissue hematoma over the left frontal bone measuring 2.5 x 1.9 cm. There is extensive soft tissue swelling in this area. Elsewhere, there is atrophy with patchy periventricular small vessel disease. No acute infarct evident. No intracranial mass, hemorrhage, or extra-axial fluid collection. No acute calvarial fracture.  CT maxillofacial: Marked soft tissue swelling over the left face and frontal region with an acute hematoma over the left frontal bone superior to the left orbit. There is extensive preseptal soft tissue swelling over the left orbit. No postseptal intraorbital lesion seen on either side. No fracture or dislocation. Paranasal sinuses clear. Ostiomeatal unit complexes are patent bilaterally. Probable cerumen in each external auditory canal.   Electronically Signed   By: Lowella Grip III M.D.   On: 06/22/2015 10:47   I have personally reviewed and evaluated these images and lab results as part of my medical decision-making.   EKG Interpretation  Date/Time:   Tuesday June 22 2015 09:29:59 EDT Ventricular Rate:  70 PR Interval:  197 QRS Duration: 108 QT Interval:  444 QTC Calculation: 479 R Axis:   77 Text Interpretation:  Sinus rhythm Anterior infarct, old No significant  change since last tracing Confirmed by POLLINA  MD, CHRISTOPHER [phone]) on  06/22/2015 9:32:51 AM     Meds given in ED:  Medications  0.9 %  sodium chloride infusion ( Intravenous New Bag/Given 06/22/15 1243)    New Prescriptions   No medications on file   Filed Vitals:   06/22/15 1145 06/22/15 1200 06/22/15 1215 06/22/15 1230  BP: 133/65 125/83 125/81 127/66  Pulse: 75 71 85 74  Temp:      TempSrc:      Resp: 11 12 15 12   Height:      Weight:      SpO2: 97% 96% 100% 99%   CRITICAL CARE Performed by: Verl Dicker   Total critical care time: 35  Critical care time was exclusive of separately billable procedures and treating other patients.  Critical care was necessary to treat or prevent imminent or life-threatening deterioration.  Critical care was time spent personally by me on the following activities: development of treatment plan with patient and/or surrogate as well as nursing, discussions with consultants, evaluation of patient's response to treatment, examination of patient, obtaining history from patient or surrogate, ordering and performing treatments and interventions, ordering and review of laboratory studies, ordering and review of radiographic studies, pulse oximetry and re-evaluation of patient's condition.  Patient requiring central line  MDM  Patient with dementia, living at home presents for evaluation of fall from a bed. Patient experienced 2 falls last night, denies loss of consciousness. Patient is unsure of why she fell. We will obtain basic labs, urinalysis, CT head and CT maxillofacial to further elucidate possible source and sequelae related to fall. Patient has been on Plavix in the past, unclear if she is taking this  now. Patient does take Xanax, Percocet and has a fentanyl patch, this could likely be source of patient's fall.  Vital signs have been stable, she remains afebrile. Physical exam as above, nonfocal neuro exam. Left periorbital hematoma as well as left frontal hematoma CT head and maxillofacial CT are negative for any acute processes. There is evidence of left frontal hematoma as well as periorbital swelling with no post-septal or intraorbital lesions noted.  Patient's labs are remarkable for a hyponatremia, sodium 112. Labs otherwise noncontributory. Patient started on normal saline infusion at 125 mL per hour. We'll consult internal medicine for further evaluation and workup of hyponatremia. Patient admitted to medical service. Discussed my attending, Dr. Betsey Holiday who also saw and evaluated the patient and agrees with above plan. Final diagnoses:  Hyponatremia        Comer Locket, PA-C 06/22/15 [place], MD 06/22/15 1256  Comer Locket, PA-C 06/22/15 1439  Orpah Greek, MD 06/22/15 [phone]

## 2015-06-22 NOTE — ED Notes (Signed)
Aimee Murdoch, RN rounding on pt and spouse.

## 2015-06-22 NOTE — ED Notes (Signed)
Patient transported to CT 

## 2015-06-22 NOTE — ED Notes (Signed)
Patient transported to X-ray 

## 2015-06-23 DIAGNOSIS — F1920 Other psychoactive substance dependence, uncomplicated: Secondary | ICD-10-CM

## 2015-06-23 LAB — BASIC METABOLIC PANEL
ANION GAP: 6 (ref 5–15)
BUN: 6 mg/dL (ref 6–20)
CALCIUM: 8.6 mg/dL — AB (ref 8.9–10.3)
CO2: 27 mmol/L (ref 22–32)
Chloride: 89 mmol/L — ABNORMAL LOW (ref 101–111)
Creatinine, Ser: 0.85 mg/dL (ref 0.44–1.00)
Glucose, Bld: 85 mg/dL (ref 65–99)
Potassium: 3.6 mmol/L (ref 3.5–5.1)
SODIUM: 122 mmol/L — AB (ref 135–145)

## 2015-06-23 LAB — TSH: TSH: 0.644 u[IU]/mL (ref 0.350–4.500)

## 2015-06-23 LAB — URINALYSIS, ROUTINE W REFLEX MICROSCOPIC
BILIRUBIN URINE: NEGATIVE
Glucose, UA: NEGATIVE mg/dL
Hgb urine dipstick: NEGATIVE
KETONES UR: NEGATIVE mg/dL
Leukocytes, UA: NEGATIVE
NITRITE: NEGATIVE
PH: 7.5 (ref 5.0–8.0)
Protein, ur: NEGATIVE mg/dL
Specific Gravity, Urine: 1.005 (ref 1.005–1.030)
UROBILINOGEN UA: 1 mg/dL (ref 0.0–1.0)

## 2015-06-23 LAB — LIPID PANEL
CHOL/HDL RATIO: 2.9 ratio
CHOLESTEROL: 129 mg/dL (ref 0–200)
HDL: 44 mg/dL (ref >40)
LDL Cholesterol: 72 mg/dL (ref 0–99)
Triglycerides: 66 mg/dL (ref <150)
VLDL: 13 mg/dL (ref 0–40)

## 2015-06-23 LAB — CORTISOL-AM, BLOOD: CORTISOL - AM: 9.8 ug/dL (ref 6.7–22.6)

## 2015-06-23 MED ORDER — VITAMIN B-1 100 MG PO TABS
100.0000 mg | ORAL_TABLET | Freq: Every day | ORAL | Status: DC
  Filled 2015-06-23 (×2): qty 1

## 2015-06-23 MED ORDER — SODIUM CHLORIDE 0.9 % IV SOLN
INTRAVENOUS | Status: DC
  Administered 2015-06-23: 19:00:00 via INTRAVENOUS

## 2015-06-23 MED ORDER — OXYCODONE HCL 5 MG PO TABS
5.0000 mg | ORAL_TABLET | ORAL | Status: DC
  Filled 2015-06-23 (×2): qty 1

## 2015-06-23 MED ORDER — FOLIC ACID 1 MG PO TABS
1.0000 mg | ORAL_TABLET | Freq: Every day | ORAL | Status: DC
  Filled 2015-06-23: qty 1

## 2015-06-23 MED ORDER — ASPIRIN EC 81 MG PO TBEC
81.0000 mg | DELAYED_RELEASE_TABLET | Freq: Every day | ORAL | Status: DC
  Filled 2015-06-23 (×3): qty 1

## 2015-06-23 MED ORDER — PROMETHAZINE HCL 25 MG/ML IJ SOLN
12.5000 mg | Freq: Once | INTRAMUSCULAR | Status: AC
  Administered 2015-06-23: 12.5 mg via INTRAVENOUS
  Filled 2015-06-23: qty 1

## 2015-06-23 MED ORDER — ALPRAZOLAM 0.5 MG PO TABS
0.5000 mg | ORAL_TABLET | Freq: Three times a day (TID) | ORAL | Status: DC
  Filled 2015-06-23: qty 1

## 2015-06-23 MED ORDER — CLONIDINE HCL 0.1 MG PO TABS
0.1000 mg | ORAL_TABLET | Freq: Two times a day (BID) | ORAL | Status: DC
  Filled 2015-06-23 (×5): qty 1

## 2015-06-23 NOTE — Progress Notes (Signed)
Inserted foley catheter at bedside. Patient tolerated well. Balloon inflated with 57ml fluid balloon but is urinated around balloon. Text paged HO

## 2015-06-23 NOTE — Progress Notes (Signed)
[place] TEAM 1 - Stepdown/ICU TEAM PROGRESS NOTE  Aimee Jones BJS:[id number] DOB: September 02, 1946 DOA: 06/22/2015 PCP: [name], MD  Admit HPI / Brief Narrative: 69 y.o. female with a history of dementia, chronic pain on opiates, hyperlipidemia, hypertension, stroke, and GERD who presented to the emergency department with increased confusion over the a couple of days, w/ 2 falls the day before her admission.  Due to the extent of the hematoma to the left temporal region, and swelling of the left eye, the patient was brought to the emergency room for evaluation by her family. No apparent loss of consciousness.  HPI/Subjective: The patient is alert but confused.  She is fixated on the thought that she cannot move her bowels though the nurse confirmed she has had 3 loose watery bowel movements already today.  She denies chest pain shortness breath fevers chills nausea or vomiting.  The nurse has taken a more detailed history from the husband who has revealed to her that the patient "takes her pain medication differently at home."  The husband has described to her frequent regimen including 15 mg of oxycodone on a nearly 1 hour basis throughout the entire day, in combination with Duragesic patch, frequent extra doses of her Xanax, Phenergan, alcohol (family states husband minimizes patient's alcohol intake), and other sedating prescription medications.  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 It is clear the patient is abusing narcotics and a very dangerous level - this appears to be the cause of her diminished mental status of late, and is likely also to blame for her falls - given the high level of abuse we cannot simply discontinue narcotics as the patient is already exhibiting symptoms of withdrawal - I have counseled the patient's husband, sister, and daughter at length regarding this issue - I will placed the patient on a fraction of her home regimen but hopefully  enough to prevent withdrawal and she will be monitored closely in the step down unit - coincidently she appears to be in no pain whatsoever at the present time  Hyponatremia Urine osmolality quite low therefore not consistent with SIADH - 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   Multiple falls with left temporal hematoma and left periorbital edema See discussion above - it appears clear to me that the patient's home environment is not safe - we will ask for PT/OT consultations when the patient is stabilized clinically but I suspect she will require a memory care unit  History of alcohol abuse LFTs elevated in pattern consistent with alcohol abuse - husband appears to be minimizing the patient's alcohol intake with other family members suggesting it is significant and daily  Iron deficiency anemia Has been seen by hematology at [facility]  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 Not likely reversible at this point - has been followed by neurology in the outpatient setting  Anxiety disorder As discussed above the patient appears to be abusing her benzodiazepines on a high level - to avoid withdrawal I will continue a fraction of her home dose on a scheduled basis  Urinary retention alternating w/ polyuria  send UA - place foley - likely due to narcotic withdrawal as noted above   Code Status: FULL Family Communication: Spoke with husband, sister, and daughter at bedside at length Disposition Plan: SDU  Consultants: None  Procedures: None  Antibiotics: None  DVT prophylaxis: SCDs  Objective: Blood pressure 112/58, pulse 86, temperature 97.9 F (36.6 C), temperature source Oral, resp. rate 15, height 5\' 7"  (  1.702 m), weight 85.6 kg (188 lb 11.4 oz), SpO2 97 %.  Intake/Output Summary (Last 24 hours) at 06/23/15 1637 Last data filed at 06/23/15 1533  Gross per 24 hour  Intake   1200 ml  Output   3081 ml  Net  -1881 ml   Exam: General: No acute respiratory distress - large ecchymosis  left forehand/temporal region with periorbital hematoma Lungs: Clear to auscultation bilaterally without wheezes or crackles Cardiovascular: Regular rate and rhythm without murmur gallop or rub normal S1 and S2 Abdomen: Nontender, nondistended, soft, bowel sounds positive, no rebound, no ascites, no appreciable mass Extremities: No significant cyanosis, clubbing, or edema bilateral lower extremities  Data Reviewed: Basic Metabolic Panel:  Recent Labs Lab 06/22/15 1132 06/22/15 2220 06/23/15 0500  NA 112* 123* 122*  K 3.7 3.7 3.6  CL 77* 89* 89*  CO2 26 28 27   GLUCOSE 86 91 85  BUN 6 6 6   CREATININE 0.81 0.79 0.85  CALCIUM 9.1 8.7* 8.6*    CBC:  Recent Labs Lab 06/22/15 1132  WBC 9.9  NEUTROABS 7.0  HGB 11.0*  HCT 32.9*  MCV 71.8*  PLT 454*    Liver Function Tests:  Recent Labs Lab 06/22/15 1132  AST 75*  ALT 33  ALKPHOS 77  BILITOT 0.8  PROT 6.1*  ALBUMIN 3.3*     Recent Results (from the past 240 hour(s))  MRSA PCR Screening     Status: None   Collection Time: 06/22/15  8:48 PM  Result Value Ref Range Status   MRSA by PCR NEGATIVE NEGATIVE Final    Comment:        The GeneXpert MRSA Assay (FDA approved for NASAL specimens only), is one component of a comprehensive MRSA colonization surveillance program. It is not intended to diagnose MRSA infection nor to guide or monitor treatment for MRSA infections.      Studies:   Recent x-ray studies have been reviewed in detail by the Attending Physician  Scheduled Meds:  Scheduled Meds: . atorvastatin  10 mg Oral q1800  . citalopram  40 mg Oral QHS  . clopidogrel  75 mg Oral Daily  . donepezil  10 mg Oral QHS  . doxepin  25 mg Oral QHS  . [START ON 06/24/2015] fentaNYL  25 mcg Transdermal Q72H  . montelukast  10 mg Oral QHS  . nabumetone  750 mg Oral BID  . pantoprazole  40 mg Oral Daily    Time spent on care of this patient: 35 mins   Jarah Pember T , MD   [facility] Office   [phone] Pager - Text Page per Shea Evans as per below:  On-Call/Text Page:      [email or web address]      password [email or web address]  If 7PM-7AM, please contact night-coverage [email or web address] Password [email or web address] 06/23/2015, 4:37 PM   LOS: 1 day

## 2015-06-23 NOTE — Progress Notes (Signed)
Utilization review completed. Raylee Strehl, RN, BSN. 

## 2015-06-24 ENCOUNTER — Inpatient Hospital Stay (HOSPITAL_COMMUNITY): Payer: Medicare Other

## 2015-06-24 DIAGNOSIS — D509 Iron deficiency anemia, unspecified: Secondary | ICD-10-CM | POA: Diagnosis present

## 2015-06-24 DIAGNOSIS — H05232 Hemorrhage of left orbit: Secondary | ICD-10-CM

## 2015-06-24 DIAGNOSIS — R0600 Dyspnea, unspecified: Secondary | ICD-10-CM

## 2015-06-24 DIAGNOSIS — F1110 Opioid abuse, uncomplicated: Secondary | ICD-10-CM | POA: Diagnosis present

## 2015-06-24 DIAGNOSIS — R339 Retention of urine, unspecified: Secondary | ICD-10-CM | POA: Diagnosis present

## 2015-06-24 DIAGNOSIS — F411 Generalized anxiety disorder: Secondary | ICD-10-CM | POA: Diagnosis present

## 2015-06-24 DIAGNOSIS — F1097 Alcohol use, unspecified with alcohol-induced persisting dementia: Secondary | ICD-10-CM

## 2015-06-24 DIAGNOSIS — R401 Stupor: Secondary | ICD-10-CM

## 2015-06-24 DIAGNOSIS — F1010 Alcohol abuse, uncomplicated: Secondary | ICD-10-CM | POA: Diagnosis present

## 2015-06-24 DIAGNOSIS — E871 Hypo-osmolality and hyponatremia: Principal | ICD-10-CM

## 2015-06-24 DIAGNOSIS — K922 Gastrointestinal hemorrhage, unspecified: Secondary | ICD-10-CM | POA: Diagnosis present

## 2015-06-24 DIAGNOSIS — F1310 Sedative, hypnotic or anxiolytic abuse, uncomplicated: Secondary | ICD-10-CM | POA: Diagnosis present

## 2015-06-24 DIAGNOSIS — E876 Hypokalemia: Secondary | ICD-10-CM | POA: Diagnosis present

## 2015-06-24 DIAGNOSIS — S0012XA Contusion of left eyelid and periocular area, initial encounter: Secondary | ICD-10-CM

## 2015-06-24 DIAGNOSIS — R296 Repeated falls: Secondary | ICD-10-CM | POA: Diagnosis present

## 2015-06-24 LAB — BASIC METABOLIC PANEL
Anion gap: 7 (ref 5–15)
Anion gap: 4 — ABNORMAL LOW (ref 5–15)
Anion gap: 5 (ref 5–15)
Anion gap: 7 (ref 5–15)
Anion gap: 7 (ref 5–15)
BUN: 9 mg/dL (ref 6–20)
BUN: 11 mg/dL (ref 6–20)
BUN: 9 mg/dL (ref 6–20)
BUN: 8 mg/dL (ref 6–20)
BUN: 8 mg/dL (ref 6–20)
CO2: 23 mmol/L (ref 22–32)
CO2: 24 mmol/L (ref 22–32)
CO2: 25 mmol/L (ref 22–32)
CO2: 24 mmol/L (ref 22–32)
CO2: 24 mmol/L (ref 22–32)
Calcium: 8.3 mg/dL — ABNORMAL LOW (ref 8.9–10.3)
Calcium: 8.3 mg/dL — ABNORMAL LOW (ref 8.9–10.3)
Calcium: 8.3 mg/dL — ABNORMAL LOW (ref 8.9–10.3)
Calcium: 8.4 mg/dL — ABNORMAL LOW (ref 8.9–10.3)
Calcium: 8.3 mg/dL — ABNORMAL LOW (ref 8.9–10.3)
Chloride: 86 mmol/L — ABNORMAL LOW (ref 101–111)
Chloride: 90 mmol/L — ABNORMAL LOW (ref 101–111)
Chloride: 93 mmol/L — ABNORMAL LOW (ref 101–111)
Chloride: 93 mmol/L — ABNORMAL LOW (ref 101–111)
Chloride: 93 mmol/L — ABNORMAL LOW (ref 101–111)
Creatinine, Ser: 0.74 mg/dL (ref 0.44–1.00)
Creatinine, Ser: 0.76 mg/dL (ref 0.44–1.00)
Creatinine, Ser: 0.76 mg/dL (ref 0.44–1.00)
Creatinine, Ser: 0.81 mg/dL (ref 0.44–1.00)
Creatinine, Ser: 0.82 mg/dL (ref 0.44–1.00)
GFR calc Af Amer: >60 mL/min (ref >60)
GFR calc Af Amer: >60 mL/min (ref >60)
GFR calc Af Amer: >60 mL/min (ref >60)
GFR calc Af Amer: >60 mL/min (ref >60)
GFR calc Af Amer: >60 mL/min (ref >60)
GFR calc non Af Amer: >60 mL/min (ref >60)
GFR calc non Af Amer: >60 mL/min (ref >60)
GFR calc non Af Amer: >60 mL/min (ref >60)
GFR calc non Af Amer: >60 mL/min (ref >60)
GFR calc non Af Amer: >60 mL/min (ref >60)
Glucose, Bld: 105 mg/dL — ABNORMAL HIGH (ref 65–99)
Glucose, Bld: 96 mg/dL (ref 65–99)
Glucose, Bld: 97 mg/dL (ref 65–99)
Glucose, Bld: 98 mg/dL (ref 65–99)
Glucose, Bld: 97 mg/dL (ref 65–99)
Potassium: 3.5 mmol/L (ref 3.5–5.1)
Potassium: 3.7 mmol/L (ref 3.5–5.1)
Potassium: 3.9 mmol/L (ref 3.5–5.1)
Potassium: 3.6 mmol/L (ref 3.5–5.1)
Potassium: 3.6 mmol/L (ref 3.5–5.1)
Sodium: 116 mmol/L — CL (ref 135–145)
Sodium: 119 mmol/L — CL (ref 135–145)
Sodium: 122 mmol/L — ABNORMAL LOW (ref 135–145)
Sodium: 124 mmol/L — ABNORMAL LOW (ref 135–145)
Sodium: 124 mmol/L — ABNORMAL LOW (ref 135–145)

## 2015-06-24 LAB — COMPREHENSIVE METABOLIC PANEL
ALT: 30 U/L (ref 14–54)
AST: 50 U/L — AB (ref 15–41)
Albumin: 2.8 g/dL — ABNORMAL LOW (ref 3.5–5.0)
Alkaline Phosphatase: 65 U/L (ref 38–126)
Anion gap: 10 (ref 5–15)
BUN: <5 mg/dL — ABNORMAL LOW (ref 6–20)
CHLORIDE: 77 mmol/L — AB (ref 101–111)
CO2: 25 mmol/L (ref 22–32)
CREATININE: 0.77 mg/dL (ref 0.44–1.00)
Calcium: 8.4 mg/dL — ABNORMAL LOW (ref 8.9–10.3)
GFR calc non Af Amer: >60 mL/min (ref >60)
Glucose, Bld: 102 mg/dL — ABNORMAL HIGH (ref 65–99)
POTASSIUM: 3 mmol/L — AB (ref 3.5–5.1)
SODIUM: 112 mmol/L — AB (ref 135–145)
Total Bilirubin: 0.6 mg/dL (ref 0.3–1.2)
Total Protein: 5.4 g/dL — ABNORMAL LOW (ref 6.5–8.1)

## 2015-06-24 LAB — CBC
HEMATOCRIT: 27.8 % — AB (ref 36.0–46.0)
HEMOGLOBIN: 9.4 g/dL — AB (ref 12.0–15.0)
MCH: 24 pg — AB (ref 26.0–34.0)
MCHC: 33.8 g/dL (ref 30.0–36.0)
MCV: 70.9 fL — AB (ref 78.0–100.0)
PLATELETS: 438 10*3/uL — AB (ref 150–400)
RBC: 3.92 MIL/uL (ref 3.87–5.11)
RDW: 17.1 % — ABNORMAL HIGH (ref 11.5–15.5)
WBC: 12.6 10*3/uL — ABNORMAL HIGH (ref 4.0–10.5)

## 2015-06-24 LAB — AMMONIA: Ammonia: 24 umol/L (ref 9–35)

## 2015-06-24 LAB — MAGNESIUM
MAGNESIUM: 2.3 mg/dL (ref 1.7–2.4)
Magnesium: 1.3 mg/dL — ABNORMAL LOW (ref 1.7–2.4)
Magnesium: 1.4 mg/dL — ABNORMAL LOW (ref 1.7–2.4)
Magnesium: 1.5 mg/dL — ABNORMAL LOW (ref 1.7–2.4)
Magnesium: 2.4 mg/dL (ref 1.7–2.4)

## 2015-06-24 LAB — TROPONIN I: Troponin I: <0.03 ng/mL (ref <0.031)

## 2015-06-24 LAB — RAPID URINE DRUG SCREEN, HOSP PERFORMED
AMPHETAMINES: NOT DETECTED
BARBITURATES: NOT DETECTED
Benzodiazepines: POSITIVE — AB
Cocaine: NOT DETECTED
Opiates: NOT DETECTED
TETRAHYDROCANNABINOL: NOT DETECTED

## 2015-06-24 LAB — OCCULT BLOOD GASTRIC / DUODENUM (SPECIMEN CUP): Occult Blood, Gastric: POSITIVE — AB

## 2015-06-24 LAB — ETHANOL: Alcohol, Ethyl (B): <5 mg/dL (ref <5)

## 2015-06-24 LAB — VITAMIN B12: Vitamin B-12: 1206 pg/mL — ABNORMAL HIGH (ref 180–914)

## 2015-06-24 LAB — RPR: RPR: NONREACTIVE

## 2015-06-24 LAB — OCCULT BLOOD X 1 CARD TO LAB, STOOL: Fecal Occult Bld: POSITIVE — AB

## 2015-06-24 LAB — HIV ANTIBODY (ROUTINE TESTING W REFLEX): HIV SCREEN 4TH GENERATION: NONREACTIVE

## 2015-06-24 LAB — CK: Total CK: 434 U/L — ABNORMAL HIGH (ref 38–234)

## 2015-06-24 LAB — FOLATE: Folate: 19.5 ng/mL (ref >5.9)

## 2015-06-24 MED ORDER — SODIUM CHLORIDE 0.9 % IV SOLN
8.0000 mg/h | INTRAVENOUS | Status: DC
  Administered 2015-06-24: 8 mg/h via INTRAVENOUS
  Filled 2015-06-24 (×5): qty 80

## 2015-06-24 MED ORDER — POTASSIUM CHLORIDE 10 MEQ/100ML IV SOLN
10.0000 meq | INTRAVENOUS | Status: AC
  Administered 2015-06-24 (×4): 10 meq via INTRAVENOUS
  Filled 2015-06-24 (×2): qty 100

## 2015-06-24 MED ORDER — FOLIC ACID 5 MG/ML IJ SOLN
1.0000 mg | Freq: Every day | INTRAMUSCULAR | Status: DC
  Filled 2015-06-24: qty 0.2

## 2015-06-24 MED ORDER — PROMETHAZINE HCL 25 MG/ML IJ SOLN
12.5000 mg | INTRAMUSCULAR | Status: DC | PRN
  Administered 2015-06-25 – 2015-06-28 (×5): 12.5 mg via INTRAVENOUS
  Filled 2015-06-24 (×5): qty 1

## 2015-06-24 MED ORDER — MORPHINE SULFATE (PF) 2 MG/ML IV SOLN
2.0000 mg | INTRAVENOUS | Status: DC
  Administered 2015-06-24 – 2015-06-26 (×13): 2 mg via INTRAVENOUS
  Filled 2015-06-24 (×13): qty 1

## 2015-06-24 MED ORDER — POTASSIUM CHLORIDE 10 MEQ/100ML IV SOLN: 10.0000 meq | INTRAVENOUS | Status: DC

## 2015-06-24 MED ORDER — VITAMIN B-1 100 MG PO TABS
100.0000 mg | ORAL_TABLET | Freq: Every day | ORAL | Status: DC
  Filled 2015-06-24: qty 1

## 2015-06-24 MED ORDER — THIAMINE HCL 100 MG/ML IJ SOLN: 100.0000 mg | Freq: Every day | INTRAMUSCULAR | Status: DC

## 2015-06-24 MED ORDER — FOLIC ACID 5 MG/ML IJ SOLN
1.0000 mg | Freq: Every day | INTRAMUSCULAR | Status: DC
Start: 2015-06-25 — End: 2015-06-24

## 2015-06-24 MED ORDER — PANTOPRAZOLE SODIUM 40 MG IV SOLR: 40.0000 mg | Freq: Two times a day (BID) | INTRAVENOUS | Status: DC

## 2015-06-24 MED ORDER — FOLIC ACID 1 MG PO TABS
1.0000 mg | ORAL_TABLET | Freq: Every day | ORAL | Status: DC
  Filled 2015-06-24: qty 1

## 2015-06-24 MED ORDER — LORAZEPAM 2 MG/ML IJ SOLN
2.0000 mg | INTRAMUSCULAR | Status: DC | PRN
Start: 2015-06-24 — End: 2015-06-29
  Administered 2015-06-24: 3 mg via INTRAVENOUS
  Administered 2015-06-24: 2 mg via INTRAVENOUS
  Administered 2015-06-24: 3 mg via INTRAVENOUS
  Administered 2015-06-24 – 2015-06-27 (×7): 2 mg via INTRAVENOUS
  Administered 2015-06-27: 3 mg via INTRAVENOUS
  Administered 2015-06-28: 2 mg via INTRAVENOUS
  Filled 2015-06-24: qty 2
  Filled 2015-06-24 (×6): qty 1
  Filled 2015-06-24 (×2): qty 2
  Filled 2015-06-24 (×3): qty 1

## 2015-06-24 MED ORDER — THIAMINE HCL 100 MG/ML IJ SOLN
Freq: Once | INTRAVENOUS | Status: AC
  Administered 2015-06-24: 13:00:00 via INTRAVENOUS
  Filled 2015-06-24: qty 1000

## 2015-06-24 MED ORDER — SODIUM CHLORIDE 3 % IV SOLN
INTRAVENOUS | Status: AC
  Administered 2015-06-24: 50 mL/h via INTRAVENOUS
  Filled 2015-06-24: qty 500

## 2015-06-24 MED ORDER — PANTOPRAZOLE SODIUM 40 MG IV SOLR
40.0000 mg | Freq: Two times a day (BID) | INTRAVENOUS | Status: DC
  Administered 2015-06-24: 40 mg via INTRAVENOUS
  Filled 2015-06-24: qty 40

## 2015-06-24 MED ORDER — MAGNESIUM SULFATE 50 % IJ SOLN
3.0000 g | Freq: Once | INTRAVENOUS | Status: AC
  Administered 2015-06-24: 3 g via INTRAVENOUS
  Filled 2015-06-24: qty 6

## 2015-06-24 MED ORDER — THIAMINE HCL 100 MG/ML IJ SOLN
100.0000 mg | Freq: Every day | INTRAMUSCULAR | Status: DC
  Filled 2015-06-24: qty 1

## 2015-06-24 NOTE — Progress Notes (Signed)
CRITICAL VALUE ALERT  Critical value received:  Na 112  Date of notification:  06/24/2015    Time of notification:  5329  Critical value read back: yes  Nurse who received alert:  Cheryll Cockayne   MD notified (1st page):  Lamar Blinks NP  Time of first page:  0550  MD notified (2nd page):  Time of second page:  Responding MD:  Lamar Blinks NP  Time MD responded:  [phone]

## 2015-06-24 NOTE — Care Management Note (Addendum)
Case Management Note  Patient Details  Name: Aimee Jones MRN: [id number] Date of Birth: [birth year]  Subjective/Objective:           PTA from home with husband ADLS independent. Admitted with hyponatremia, and s/p falls. History of dementia, chronic pain on opiates, hyperlipidemia, hypertension, stroke, GERD, etoh/substance abuse.     Action/Plan:  Return to home when medically stable. CM  to f/u with d/c disposition.  Expected Discharge Date:        unknown   Expected Discharge Plan:  Home/Self Care  In-House Referral:     Discharge planning Services  CM Consult  Post Acute Care Choice:    Choice offered to:     DME Arranged:    DME Agency:     HH Arranged:    HH Agency:     Status of Service:  In process, will continue to follow  Medicare Important Message Given:    Date Medicare IM Given:    Medicare IM give by:    Date Additional Medicare IM Given:    Additional Medicare Important Message give by:     If discussed at [place] of Stay Meetings, dates discussed:    Additional Comments:  CM spoke with pt's husband regarding potential needs @ d/c . Husband( primary caretaker)  stated would like to use [facility] @ d/c for [facility] and sitter service. However, if pt needs snf/rehab @  d/c he would like wife to go to [facility].  Aimee Jones (Spouse) [phone],  Aimee Jones (Daughter)  [phone]   [place] [address] [place], [place] [phone] 06/24/2015, 2:49 PM
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
## 2015-06-24 NOTE — Progress Notes (Signed)
Pt throwing up coffee ground emesis and stool.  NP on call notified and samples on both sent to lab to test for blood.  Will continue to monitor and notify of results.

## 2015-06-24 NOTE — Progress Notes (Signed)
  Echocardiogram 2D Echocardiogram has been performed.  Darlina Sicilian M 06/24/2015, 10:21 AM

## 2015-06-25 ENCOUNTER — Inpatient Hospital Stay (HOSPITAL_COMMUNITY): Payer: Medicare Other

## 2015-06-25 DIAGNOSIS — R410 Disorientation, unspecified: Secondary | ICD-10-CM

## 2015-06-25 DIAGNOSIS — D509 Iron deficiency anemia, unspecified: Secondary | ICD-10-CM

## 2015-06-25 DIAGNOSIS — F1010 Alcohol abuse, uncomplicated: Secondary | ICD-10-CM

## 2015-06-25 DIAGNOSIS — W19XXXA Unspecified fall, initial encounter: Secondary | ICD-10-CM

## 2015-06-25 DIAGNOSIS — K2921 Alcoholic gastritis with bleeding: Secondary | ICD-10-CM

## 2015-06-25 LAB — LACTIC ACID, PLASMA: LACTIC ACID, VENOUS: 0.6 mmol/L (ref 0.5–2.0)

## 2015-06-25 LAB — COMPREHENSIVE METABOLIC PANEL
ALK PHOS: 54 U/L (ref 38–126)
ALT: 28 U/L (ref 14–54)
AST: 42 U/L — ABNORMAL HIGH (ref 15–41)
Albumin: 2.7 g/dL — ABNORMAL LOW (ref 3.5–5.0)
Anion gap: 5 (ref 5–15)
BILIRUBIN TOTAL: 0.5 mg/dL (ref 0.3–1.2)
BUN: 8 mg/dL (ref 6–20)
CALCIUM: 8.6 mg/dL — AB (ref 8.9–10.3)
CO2: 24 mmol/L (ref 22–32)
CREATININE: 0.84 mg/dL (ref 0.44–1.00)
Chloride: 99 mmol/L — ABNORMAL LOW (ref 101–111)
Glucose, Bld: 90 mg/dL (ref 65–99)
Potassium: 3.6 mmol/L (ref 3.5–5.1)
Sodium: 128 mmol/L — ABNORMAL LOW (ref 135–145)
Total Protein: 5.4 g/dL — ABNORMAL LOW (ref 6.5–8.1)

## 2015-06-25 LAB — CUP PACEART REMOTE DEVICE CHECK: Date Time Interrogation Session: 20160909113830

## 2015-06-25 LAB — MAGNESIUM: Magnesium: 2.4 mg/dL (ref 1.7–2.4)

## 2015-06-25 LAB — BASIC METABOLIC PANEL
Anion gap: 4 — ABNORMAL LOW (ref 5–15)
BUN: 8 mg/dL (ref 6–20)
CHLORIDE: 97 mmol/L — AB (ref 101–111)
CO2: 24 mmol/L (ref 22–32)
CREATININE: 0.83 mg/dL (ref 0.44–1.00)
Calcium: 8.5 mg/dL — ABNORMAL LOW (ref 8.9–10.3)
GFR calc Af Amer: >60 mL/min (ref >60)
GFR calc non Af Amer: >60 mL/min (ref >60)
GLUCOSE: 94 mg/dL (ref 65–99)
Potassium: 3.8 mmol/L (ref 3.5–5.1)
Sodium: 125 mmol/L — ABNORMAL LOW (ref 135–145)

## 2015-06-25 LAB — CBC WITH DIFFERENTIAL/PLATELET
BASOS PCT: 1 % (ref 0–1)
Basophils Absolute: 0.1 10*3/uL (ref 0.0–0.1)
EOS ABS: 0.4 10*3/uL (ref 0.0–0.7)
Eosinophils Relative: 4 % (ref 0–5)
HCT: 26.1 % — ABNORMAL LOW (ref 36.0–46.0)
HEMOGLOBIN: 8.9 g/dL — AB (ref 12.0–15.0)
Lymphocytes Relative: 25 % (ref 12–46)
Lymphs Abs: 2.3 10*3/uL (ref 0.7–4.0)
MCH: 25 pg — ABNORMAL LOW (ref 26.0–34.0)
MCHC: 34.1 g/dL (ref 30.0–36.0)
MCV: 73.3 fL — ABNORMAL LOW (ref 78.0–100.0)
MONOS PCT: 10 % (ref 3–12)
Monocytes Absolute: 0.9 10*3/uL (ref 0.1–1.0)
NEUTROS PCT: 60 % (ref 43–77)
Neutro Abs: 5.6 10*3/uL (ref 1.7–7.7)
Platelets: 408 10*3/uL — ABNORMAL HIGH (ref 150–400)
RBC: 3.56 MIL/uL — ABNORMAL LOW (ref 3.87–5.11)
RDW: 18 % — AB (ref 11.5–15.5)
WBC: 9.2 10*3/uL (ref 4.0–10.5)

## 2015-06-25 LAB — PROTIME-INR
INR: 1.19 (ref 0.00–1.49)
PROTHROMBIN TIME: 15.3 s — AB (ref 11.6–15.2)

## 2015-06-25 LAB — URINE CULTURE

## 2015-06-25 LAB — HEMOGLOBIN AND HEMATOCRIT, BLOOD
HEMATOCRIT: 27.2 % — AB (ref 36.0–46.0)
HEMOGLOBIN: 9 g/dL — AB (ref 12.0–15.0)

## 2015-06-25 LAB — TROPONIN I: Troponin I: <0.03 ng/mL (ref <0.031)

## 2015-06-25 MED ORDER — HALOPERIDOL LACTATE 5 MG/ML IJ SOLN
2.0000 mg | Freq: Once | INTRAMUSCULAR | Status: AC
  Administered 2015-06-25: 2 mg via INTRAVENOUS
  Filled 2015-06-25: qty 1

## 2015-06-25 MED ORDER — FOLIC ACID 5 MG/ML IJ SOLN
1.0000 mg | Freq: Every day | INTRAMUSCULAR | Status: DC
  Administered 2015-06-25 – 2015-06-26 (×2): 1 mg via INTRAVENOUS
  Filled 2015-06-25 (×2): qty 0.2

## 2015-06-25 MED ORDER — PANTOPRAZOLE SODIUM 40 MG IV SOLR
40.0000 mg | Freq: Two times a day (BID) | INTRAVENOUS | Status: AC
  Administered 2015-06-25: 40 mg via INTRAVENOUS

## 2015-06-25 MED ORDER — WHITE PETROLATUM GEL
Status: AC
  Administered 2015-06-25: 14:00:00
  Filled 2015-06-25: qty 1

## 2015-06-25 MED ORDER — DEXTROSE 5 % IV SOLN
1.0000 g | INTRAVENOUS | Status: DC
  Administered 2015-06-25 – 2015-06-28 (×4): 1 g via INTRAVENOUS
  Filled 2015-06-25 (×5): qty 10

## 2015-06-25 MED ORDER — PANTOPRAZOLE SODIUM 40 MG PO TBEC
40.0000 mg | DELAYED_RELEASE_TABLET | Freq: Two times a day (BID) | ORAL | Status: DC
  Administered 2015-06-26 – 2015-06-29 (×7): 40 mg via ORAL
  Filled 2015-06-25 (×7): qty 1

## 2015-06-25 MED ORDER — ONDANSETRON HCL 4 MG PO TABS
4.0000 mg | ORAL_TABLET | Freq: Two times a day (BID) | ORAL | Status: DC
  Administered 2015-06-25 – 2015-06-29 (×8): 4 mg via ORAL
  Filled 2015-06-25 (×10): qty 1

## 2015-06-25 MED ORDER — THIAMINE HCL 100 MG/ML IJ SOLN
100.0000 mg | Freq: Every day | INTRAMUSCULAR | Status: DC
  Administered 2015-06-25 – 2015-06-26 (×2): 100 mg via INTRAVENOUS
  Filled 2015-06-25 (×2): qty 1
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
## 2015-06-25 NOTE — Progress Notes (Signed)
Carelink summary report received. Battery status OK. Normal device function. No new symptom episodes, tachy episodes, brady, or pause episodes. No new AF episodes. Monthly summary reports and ROV with GT in 10/2015. 
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
## 2015-06-25 NOTE — Care Management Important Message (Signed)
Important Message  Patient Details  Name: Aimee Jones MRN: [id number] Date of Birth: [birth year]   Medicare Important Message Given:  [facility] notification given    Nathen May 06/25/2015, 11:51 AMImportant Message  Patient Details  Name: Aimee Jones MRN: [id number] Date of Birth: April 27, 1946   Medicare Important Message Given:  Yes-second notification given    Nathen May 06/25/2015, 11:51 AM
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
## 2015-06-26 DIAGNOSIS — F32A Depression, unspecified: Secondary | ICD-10-CM | POA: Diagnosis present

## 2015-06-26 DIAGNOSIS — K922 Gastrointestinal hemorrhage, unspecified: Secondary | ICD-10-CM

## 2015-06-26 DIAGNOSIS — F329 Major depressive disorder, single episode, unspecified: Secondary | ICD-10-CM

## 2015-06-26 LAB — COMPREHENSIVE METABOLIC PANEL
ALBUMIN: 2.7 g/dL — AB (ref 3.5–5.0)
ALK PHOS: 64 U/L (ref 38–126)
ALT: 30 U/L (ref 14–54)
AST: 35 U/L (ref 15–41)
Anion gap: 7 (ref 5–15)
BILIRUBIN TOTAL: 0.5 mg/dL (ref 0.3–1.2)
BUN: 6 mg/dL (ref 6–20)
CO2: 25 mmol/L (ref 22–32)
CREATININE: 0.89 mg/dL (ref 0.44–1.00)
Calcium: 8.7 mg/dL — ABNORMAL LOW (ref 8.9–10.3)
Chloride: 100 mmol/L — ABNORMAL LOW (ref 101–111)
GFR calc Af Amer: >60 mL/min (ref >60)
GLUCOSE: 101 mg/dL — AB (ref 65–99)
Potassium: 3.3 mmol/L — ABNORMAL LOW (ref 3.5–5.1)
Sodium: 132 mmol/L — ABNORMAL LOW (ref 135–145)
TOTAL PROTEIN: 5.5 g/dL — AB (ref 6.5–8.1)

## 2015-06-26 LAB — CBC WITH DIFFERENTIAL/PLATELET
BASOS ABS: 0.1 10*3/uL (ref 0.0–0.1)
Basophils Relative: 1 % (ref 0–1)
EOS ABS: 0.6 10*3/uL (ref 0.0–0.7)
EOS PCT: 5 % (ref 0–5)
HCT: 26.6 % — ABNORMAL LOW (ref 36.0–46.0)
Hemoglobin: 8.6 g/dL — ABNORMAL LOW (ref 12.0–15.0)
LYMPHS PCT: 27 % (ref 12–46)
Lymphs Abs: 3.2 10*3/uL (ref 0.7–4.0)
MCH: 24 pg — AB (ref 26.0–34.0)
MCHC: 32.3 g/dL (ref 30.0–36.0)
MCV: 74.1 fL — AB (ref 78.0–100.0)
MONO ABS: 1 10*3/uL (ref 0.1–1.0)
Monocytes Relative: 9 % (ref 3–12)
Neutro Abs: 6.8 10*3/uL (ref 1.7–7.7)
Neutrophils Relative %: 58 % (ref 43–77)
PLATELETS: 454 10*3/uL — AB (ref 150–400)
RBC: 3.59 MIL/uL — AB (ref 3.87–5.11)
RDW: 18.4 % — AB (ref 11.5–15.5)
WBC: 11.7 10*3/uL — AB (ref 4.0–10.5)

## 2015-06-26 MED ORDER — FERROUS SULFATE 325 (65 FE) MG PO TABS
325.0000 mg | ORAL_TABLET | Freq: Three times a day (TID) | ORAL | Status: DC
Start: 2015-06-27 — End: 2015-06-26
  Filled 2015-06-26: qty 1

## 2015-06-26 MED ORDER — VITAMIN C 500 MG PO TABS
500.0000 mg | ORAL_TABLET | Freq: Two times a day (BID) | ORAL | Status: DC
  Administered 2015-06-26 – 2015-06-29 (×6): 500 mg via ORAL
  Filled 2015-06-26 (×7): qty 1

## 2015-06-26 MED ORDER — CLOPIDOGREL BISULFATE 75 MG PO TABS
75.0000 mg | ORAL_TABLET | Freq: Every day | ORAL | Status: DC
  Administered 2015-06-27 – 2015-06-29 (×3): 75 mg via ORAL
  Filled 2015-06-26 (×3): qty 1

## 2015-06-26 MED ORDER — TRAMADOL HCL 50 MG PO TABS
50.0000 mg | ORAL_TABLET | Freq: Three times a day (TID) | ORAL | Status: DC
  Administered 2015-06-26 – 2015-06-29 (×8): 50 mg via ORAL
  Filled 2015-06-26 (×8): qty 1

## 2015-06-26 MED ORDER — FERROUS SULFATE 325 (65 FE) MG PO TABS
325.0000 mg | ORAL_TABLET | Freq: Three times a day (TID) | ORAL | Status: DC
  Administered 2015-06-26 – 2015-06-29 (×9): 325 mg via ORAL
  Filled 2015-06-26 (×11): qty 1

## 2015-06-26 MED ORDER — VITAMIN C 500 MG PO TABS
500.0000 mg | ORAL_TABLET | Freq: Two times a day (BID) | ORAL | Status: DC
  Filled 2015-06-26: qty 1

## 2015-06-26 MED ORDER — FOLIC ACID 1 MG PO TABS
1.0000 mg | ORAL_TABLET | Freq: Every day | ORAL | Status: DC
  Administered 2015-06-27 – 2015-06-29 (×3): 1 mg via ORAL
  Filled 2015-06-26 (×3): qty 1

## 2015-06-26 MED ORDER — POTASSIUM CHLORIDE 10 MEQ/100ML IV SOLN
10.0000 meq | INTRAVENOUS | Status: AC
  Administered 2015-06-26 (×3): 10 meq via INTRAVENOUS
  Filled 2015-06-26 (×3): qty 100

## 2015-06-26 MED ORDER — INFLUENZA VAC SPLIT QUAD 0.5 ML IM SUSY
0.5000 mL | PREFILLED_SYRINGE | INTRAMUSCULAR | Status: AC
  Administered 2015-06-27: 0.5 mL via INTRAMUSCULAR
  Filled 2015-06-26: qty 0.5

## 2015-06-26 MED ORDER — VITAMIN B-1 100 MG PO TABS
100.0000 mg | ORAL_TABLET | Freq: Every day | ORAL | Status: DC
  Administered 2015-06-27 – 2015-06-29 (×3): 100 mg via ORAL
  Filled 2015-06-26 (×3): qty 1

## 2015-06-26 MED ORDER — LISINOPRIL 5 MG PO TABS
2.5000 mg | ORAL_TABLET | Freq: Every day | ORAL | Status: DC
  Administered 2015-06-26 – 2015-06-29 (×4): 2.5 mg via ORAL
  Filled 2015-06-26 (×4): qty 1

## 2015-06-26 NOTE — Progress Notes (Signed)
Patient extremely agitated and confused family called and updated. Family in to see Patient

## 2015-06-26 NOTE — Progress Notes (Signed)
Patient remains confused MD notified orders received will continue to monitor Patient closely.
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
## 2015-06-26 NOTE — Evaluation (Signed)
Physical Therapy Evaluation Patient Details Name: Aimee Jones MRN: [id number] DOB: 1945-11-26 Today's Date: 06/26/2015   History of Present Illness  69 y.o. WF PMHx Alcoholism, Substance Abuse (extremely large amounts of narcotics and benzodiazepine's),Dementia, memory loss, Anxiety, Depression, Chronic Pain, HLD, HTN, CVA,.   Clinical Impression  Patient demonstrates deficits in functional mobility as indicated below. Will need continued skilled PT to address deficits and maximize function. Will see as indicated and progress as tolerated. Patient with significant deficits in balance, safety and mobility. Very high risk for recurrent falls. Will need continued post acute rehabilitation. Recommend SNF     Follow Up Recommendations SNF;Supervision/Assistance - 24 hour    Equipment Recommendations  Rolling walker with 5" wheels    Recommendations for Other Services       Precautions / Restrictions Precautions Precautions: Fall Restrictions Weight Bearing Restrictions: No      Mobility  Bed Mobility Overal bed mobility: Needs Assistance;+ 2 for safety/equipment Bed Mobility: Supine to Sit     Supine to sit: Mod assist     General bed mobility comments: patient very impulsive, assist to elevate trunk to EOB and control safety with transition. Dizziness noted  Transfers Overall transfer level: Needs assistance Equipment used: 2 person hand held assist Transfers: Sit to/from Stand Sit to Stand: Mod assist         General transfer comment: moderate assist for stability when coming to standing, posterior LOB initially, +2 assist for ssafety  Ambulation/Gait Ambulation/Gait assistance: Min assist;+2 physical assistance Ambulation Distance (Feet): 140 Feet Assistive device: 2 person hand held assist Gait Pattern/deviations: Step-through pattern;Decreased stride length;Staggering left;Staggering right;Narrow base of support;Ataxic     General Gait Details:  instbility with gait requiring +2 assist for safety and stability. Patient very impulsive during ambulation, increase dassist for 2 noted LOB  Stairs            Wheelchair Mobility    Modified Rankin (Stroke Patients Only)       Balance Overall balance assessment: History of Falls                                           Pertinent Vitals/Pain      Home Living Family/patient expects to be discharged to:: Private residence Living Arrangements: Spouse/significant other Available Help at Discharge: Family;Available 24 hours/day Type of Home: House Home Access: Stairs to enter     Home Layout: One level Home Equipment: Environmental consultant - 2 wheels;Cane - single point;Crutches;Walker - 4 wheels      Prior Function Level of Independence: Independent         Comments: Husband drives.      Hand Dominance   Dominant Hand: Right    Extremity/Trunk Assessment   Upper Extremity Assessment: Defer to OT evaluation           Lower Extremity Assessment: Difficult to assess due to impaired cognition         Communication   Communication: No difficulties  Cognition Arousal/Alertness: Awake/alert Behavior During Therapy: Restless;Impulsive Overall Cognitive Status: Impaired/Different from baseline Area of Impairment: Orientation;Attention;Memory;Following commands;Safety/judgement;Awareness;Problem solving Orientation Level: Disoriented to;Situation;Time Current Attention Level: Focused Memory: Decreased short-term memory Following Commands: Follows multi-step commands inconsistently Safety/Judgement: Decreased awareness of safety;Decreased awareness of deficits Awareness: Intellectual Problem Solving: Slow processing;Requires verbal cues;Requires tactile cues      General Comments      Exercises  Assessment/Plan    PT Assessment Patient needs continued PT services  PT Diagnosis Difficulty walking;Abnormality of gait;Generalized  weakness;Altered mental status;Acute pain   PT Problem List Decreased activity tolerance;Decreased balance;Decreased mobility;Decreased cognition;Decreased coordination;Decreased safety awareness;Pain  PT Treatment Interventions DME instruction;Gait training;Stair training;Functional mobility training;Therapeutic activities;Therapeutic exercise;Balance training;Cognitive remediation;Patient/family education   PT Goals (Current goals can be found in the Care Plan section) Acute Rehab PT Goals PT Goal Formulation: Patient unable to participate in goal setting Time For Goal Achievement: 07/10/15 Potential to Achieve Goals: Good    Frequency Min 3X/week   Barriers to discharge        Co-evaluation               End of Session Equipment Utilized During Treatment: Gait belt Activity Tolerance: Patient tolerated treatment well Patient left: in chair;with call bell/phone within reach;with chair alarm set;with family/visitor present;with nursing/sitter in room (mitts reapplied as pt continually reaching for PICC site) Nurse Communication: Mobility status;Precautions         Time: 2423-5361 PT Time Calculation (min) (ACUTE ONLY): 28 min   Charges:   PT Evaluation $Initial PT Evaluation Tier I: 1 Procedure PT Treatments $Gait Training: 8-22 mins   PT G CodesDuncan Dull July 02, 2015, 6:16 PM Alben Deeds, [place] DPT  [phone]

## 2015-06-27 DIAGNOSIS — E871 Hypo-osmolality and hyponatremia: Secondary | ICD-10-CM | POA: Diagnosis not present

## 2015-06-27 LAB — COMPREHENSIVE METABOLIC PANEL
ALBUMIN: 3 g/dL — AB (ref 3.5–5.0)
ALK PHOS: 66 U/L (ref 38–126)
ALT: 29 U/L (ref 14–54)
AST: 27 U/L (ref 15–41)
Anion gap: 6 (ref 5–15)
CALCIUM: 9.2 mg/dL (ref 8.9–10.3)
CHLORIDE: 101 mmol/L (ref 101–111)
CO2: 25 mmol/L (ref 22–32)
CREATININE: 0.98 mg/dL (ref 0.44–1.00)
GFR calc Af Amer: >60 mL/min (ref >60)
GFR calc non Af Amer: 57 mL/min — ABNORMAL LOW (ref >60)
GLUCOSE: 105 mg/dL — AB (ref 65–99)
Potassium: 3.7 mmol/L (ref 3.5–5.1)
SODIUM: 132 mmol/L — AB (ref 135–145)
Total Bilirubin: 0.5 mg/dL (ref 0.3–1.2)
Total Protein: 5.8 g/dL — ABNORMAL LOW (ref 6.5–8.1)

## 2015-06-27 LAB — CBC WITH DIFFERENTIAL/PLATELET
Basophils Absolute: 0.1 10*3/uL (ref 0.0–0.1)
Basophils Relative: 0 % (ref 0–1)
EOS ABS: 0.4 10*3/uL (ref 0.0–0.7)
EOS PCT: 3 % (ref 0–5)
HCT: 27.8 % — ABNORMAL LOW (ref 36.0–46.0)
HEMOGLOBIN: 9.3 g/dL — AB (ref 12.0–15.0)
LYMPHS ABS: 2.3 10*3/uL (ref 0.7–4.0)
Lymphocytes Relative: 19 % (ref 12–46)
MCH: 25.1 pg — AB (ref 26.0–34.0)
MCHC: 33.5 g/dL (ref 30.0–36.0)
MCV: 74.9 fL — ABNORMAL LOW (ref 78.0–100.0)
MONO ABS: 1 10*3/uL (ref 0.1–1.0)
MONOS PCT: 8 % (ref 3–12)
NEUTROS PCT: 70 % (ref 43–77)
Neutro Abs: 8.7 10*3/uL — ABNORMAL HIGH (ref 1.7–7.7)
Platelets: 429 10*3/uL — ABNORMAL HIGH (ref 150–400)
RBC: 3.71 MIL/uL — ABNORMAL LOW (ref 3.87–5.11)
RDW: 19.3 % — AB (ref 11.5–15.5)
WBC: 12.4 10*3/uL — ABNORMAL HIGH (ref 4.0–10.5)

## 2015-06-27 LAB — GLUCOSE, CAPILLARY
GLUCOSE-CAPILLARY: 117 mg/dL — AB (ref 65–99)
Glucose-Capillary: 104 mg/dL — ABNORMAL HIGH (ref 65–99)

## 2015-06-27 MED ORDER — METOPROLOL TARTRATE 12.5 MG HALF TABLET
12.5000 mg | ORAL_TABLET | Freq: Two times a day (BID) | ORAL | Status: DC
  Administered 2015-06-27 – 2015-06-29 (×5): 12.5 mg via ORAL
  Filled 2015-06-27 (×6): qty 1
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
## 2015-06-28 LAB — COMPREHENSIVE METABOLIC PANEL
ALBUMIN: 3 g/dL — AB (ref 3.5–5.0)
ALT: 24 U/L (ref 14–54)
ANION GAP: 7 (ref 5–15)
AST: 24 U/L (ref 15–41)
Alkaline Phosphatase: 82 U/L (ref 38–126)
BILIRUBIN TOTAL: 0.6 mg/dL (ref 0.3–1.2)
BUN: <5 mg/dL — ABNORMAL LOW (ref 6–20)
CO2: 25 mmol/L (ref 22–32)
Calcium: 9.1 mg/dL (ref 8.9–10.3)
Chloride: 100 mmol/L — ABNORMAL LOW (ref 101–111)
Creatinine, Ser: 0.93 mg/dL (ref 0.44–1.00)
Glucose, Bld: 93 mg/dL (ref 65–99)
POTASSIUM: 3.7 mmol/L (ref 3.5–5.1)
Sodium: 132 mmol/L — ABNORMAL LOW (ref 135–145)
TOTAL PROTEIN: 5.8 g/dL — AB (ref 6.5–8.1)

## 2015-06-28 LAB — CBC WITH DIFFERENTIAL/PLATELET
BASOS ABS: 0.1 10*3/uL (ref 0.0–0.1)
BASOS PCT: 1 % (ref 0–1)
Eosinophils Absolute: 0.5 10*3/uL (ref 0.0–0.7)
Eosinophils Relative: 4 % (ref 0–5)
HEMATOCRIT: 29 % — AB (ref 36.0–46.0)
Hemoglobin: 9.4 g/dL — ABNORMAL LOW (ref 12.0–15.0)
LYMPHS PCT: 18 % (ref 12–46)
Lymphs Abs: 2 10*3/uL (ref 0.7–4.0)
MCH: 24.2 pg — ABNORMAL LOW (ref 26.0–34.0)
MCHC: 32.4 g/dL (ref 30.0–36.0)
MCV: 74.7 fL — AB (ref 78.0–100.0)
Monocytes Absolute: 0.9 10*3/uL (ref 0.1–1.0)
Monocytes Relative: 9 % (ref 3–12)
NEUTROS ABS: 7.6 10*3/uL (ref 1.7–7.7)
NEUTROS PCT: 68 % (ref 43–77)
Platelets: 469 10*3/uL — ABNORMAL HIGH (ref 150–400)
RBC: 3.88 MIL/uL (ref 3.87–5.11)
RDW: 19.6 % — ABNORMAL HIGH (ref 11.5–15.5)
WBC: 11 10*3/uL — AB (ref 4.0–10.5)

## 2015-06-28 NOTE — Progress Notes (Signed)
Patient arrived from 3S oriented x1. Safety sitter at bedside. Bruising from head to toe on left side of body. Call bell within reach.

## 2015-06-28 NOTE — Progress Notes (Signed)
Physical Therapy Treatment Patient Details Name: Aimee Jones MRN: [id number] DOB: 09/06/1946 Today's Date: 06/28/2015    History of Present Illness 69 y.o. WF PMHx Alcoholism, Substance Abuse (extremely large amounts of narcotics and benzodiazepine's),Dementia, memory loss, Anxiety, Depression, Chronic Pain, HLD, HTN, CVA,.     PT Comments    Pt nauseas today though has not vomited and this did not increase with activity.  Pt was able to tolerate ambulation with +2 hand held assist, requiring Min assist due to unsteadiness and multiple LOB.  Continued PT services necessary to ensure safety with mobility and improved balance to decrease falls.  Follow Up Recommendations  SNF;Supervision/Assistance - 24 hour     Equipment Recommendations  Rolling walker with 5" wheels    Recommendations for Other Services       Precautions / Restrictions Precautions Precautions: Fall Restrictions Weight Bearing Restrictions: No    Mobility  Bed Mobility Overal bed mobility: Needs Assistance Bed Mobility: Rolling;Sidelying to Sit Rolling: Supervision Sidelying to sit: Supervision   Sit to supine: Supervision   General bed mobility comments: some dizziness with supine to sit; verbal cues for scooting to middle and HOB  Transfers Overall transfer level: Needs assistance Equipment used: 2 person hand held assist Transfers: Sit to/from Stand Sit to Stand: Min guard         General transfer comment: min guard due to unsteadiness  Ambulation/Gait Ambulation/Gait assistance: Min assist Ambulation Distance (Feet): 180 Feet Assistive device: 2 person hand held assist Gait Pattern/deviations: Step-through pattern Gait velocity: slow; able to increase some but requires frequent verbal cueing to do so Gait velocity interpretation: Below normal speed for age/gender General Gait Details: instability with gait requiring +2 assist for safety; LOB multiple times requiring up to Min assist  to prevent fall   Stairs            Wheelchair Mobility    Modified Rankin (Stroke Patients Only)       Balance Overall balance assessment: History of Falls;Needs assistance Sitting-balance support: No upper extremity supported;Feet supported Sitting balance-Leahy Scale: Fair     Standing balance support: No upper extremity supported Standing balance-Leahy Scale: Poor                      Cognition Arousal/Alertness: Awake/alert Behavior During Therapy: WFL for tasks assessed/performed Overall Cognitive Status: Difficult to assess Area of Impairment: Attention;Safety/judgement;Awareness;Problem solving         Safety/Judgement: Decreased awareness of safety;Decreased awareness of deficits   Problem Solving: Slow processing;Requires verbal cues;Requires tactile cues      Exercises      General Comments General comments (skin integrity, edema, etc.): overall nauseas but no increase with activity      Pertinent Vitals/Pain Pain Assessment: No/denies pain    Home Living                      Prior Function            PT Goals (current goals can now be found in the care plan section) Acute Rehab PT Goals Patient Stated Goal: to go home PT Goal Formulation: Patient unable to participate in goal setting Time For Goal Achievement: 07/10/15 Potential to Achieve Goals: Good Pt progressing toward PT goals.    Frequency  Min 3X/week    PT Plan Current plan remains appropriate    Co-evaluation             End of Session Equipment Utilized  During Treatment: Gait belt Activity Tolerance: Patient tolerated treatment well Patient left: in bed;with call bell/phone within reach;with bed alarm set;with family/visitor present     Time: 2194-7125 PT Time Calculation (min) (ACUTE ONLY): 17 min  Charges:   1 PT Visit - 1 PT Gait Training                    G CodesEstill Bamberg Clarity Jones 2015-07-22, 2:52 PM  Aimee Jones, SPT

## 2015-06-28 NOTE — Progress Notes (Signed)
[place] TEAM 1 - Stepdown/ICU TEAM PROGRESS NOTE  Aimee Jones MVH:[id number] DOB: 1946-10-14 DOA: 06/22/2015 PCP: [name], MD  Admit HPI / Brief Narrative: 69 y.o. female with a history of dementia, chronic pain on opiates, hyperlipidemia, hypertension, stroke, and GERD who presented to the emergency department with increased confusion over the a couple of days, w/ 2 falls the day before her admission.  Due to the extent of the hematoma to the left temporal region, and swelling of the left eye, the patient was brought to the emergency room for evaluation by her family. No apparent loss of consciousness.  HPI/Subjective: The patient is much more alert than when I last examined her some days ago.  She remains confused however.  She does not appear to be uncomfortable and she denies shortness of breath chest pain or abdominal pain but complains of unrelenting nausea.  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 -Multifactorial to include extreme narcotic abuse, benzodiazepine abuse, alcohol abuse.  -Patient was on Xanax 3 times a day however husband admitted to [facility] & myself he was dispensing to the patient much more often. In addition patient was taking oxycodone IR 15 mg q 1-2hr + Nambumetone (unknown amount taken per day).  -Continue Tramadol 50 mg TID  Alcohol Abuse/Narcotic abuse/Benzodiazepine abuse -LFTs elevated in pattern consistent with alcohol abuse  -husband appears to be minimizing the patient's alcohol intake with other family members suggesting it is significant and daily -Ethanol level not obtained on admission however husband admits to providing alcohol to patient -Continue folic acid 1 mg, thiamine (B-1) 100 mg daily  Hyponatremia Urine osmolality quite low therefore not consistent with SIADH - 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 - sodium has stabilized at 132  Multiple falls with left temporal hematoma and left  periorbital edema it appears clear to me that the patient's home environment is not safe - PT/OT have suggested skilled nursing facility placement and family is presently in agreement - social work is pursuing bed placement at this time  Iron deficiency anemia Has been seen by hematology at [facility]  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 -Evaluated by GI, and they will see as outpatient ~ 6 weeks postdischarge, at which time with they will decide colonoscopy, EGD or both D/Ced aspirin  -Continue Protonix 40 mg BID  Code Status: FULL Family Communication: Spoke with husband at bedside  Disposition Plan: Stable for transfer to medical bed while awaiting SNF placement  Consultants: [facility]  Procedures: 9/8 echocardiogram;- Left ventricle: mild LVH. -LVEF= 55% to 60%. - (grade 1 diastolic dysfunction). - Pericardium, extracardiac: A trivial pericardial effusion   Antibiotics: Rocephin 9/9 >  DVT prophylaxis: SCDs  Objective: Blood pressure 126/76, pulse 94, temperature 97.7 F (36.5 C), temperature source Oral, resp. rate 14, height 5\' 6"  (1.676 m), weight 79.8 kg (175 lb 14.8 oz), SpO2 94 %.  Intake/Output Summary (Last 24 hours) at 06/28/15 1434 Last data filed at 06/28/15 0930  Gross per 24 hour  Intake    770 ml  Output   1500 ml  Net   -730 ml   Exam: General: No acute respiratory distress - large ecchymosis left forehand/temporal region with periorbital hematoma much improved Lungs: Clear to  auscultation bilaterally without wheezes or crackles Cardiovascular: Regular rate and rhythm without murmur gallop or rub Abdomen: Nontender, nondistended, soft, bowel sounds positive, no rebound, no ascites, no appreciable mass Extremities: No significant cyanosis, clubbing, edema bilateral lower extremities  Data Reviewed: Basic Metabolic Panel:  Recent Labs Lab 06/24/15 1530 06/24/15 1800  06/24/15 1930 06/24/15 2200 06/24/15 2330 06/25/15 0430 06/26/15 0550 06/27/15 0430 06/28/15 0253  NA  --  122*  < > 124* 124* 125* 128* 132* 132* 132*  K  --  3.7  < > 3.6 3.6 3.8 3.6 3.3* 3.7 3.7  CL  --  93*  < > 93* 93* 97* 99* 100* 101 100*  CO2  --  24  < > 24 24 24 24 25 25 25   GLUCOSE  --  96  < > 98 97 94 90 101* 105* 93  BUN  --  9  < > 8 8 8 8 6  <5* <5*  CREATININE  --  0.76  < > 0.81 0.82 0.83 0.84 0.89 0.98 0.93  CALCIUM  --  8.3*  < > 8.4* 8.3* 8.5* 8.6* 8.7* 9.2 9.1  MG 1.4* 1.5*  --  2.3 2.4 2.4  --   --   --   --   < > = values in this interval not displayed.  CBC:  Recent Labs Lab 06/22/15 1132 06/24/15 0500 06/25/15 0430 06/25/15 1628 06/26/15 0550 06/27/15 0430 06/28/15 0253  WBC 9.9 12.6* 9.2  --  11.7* 12.4* 11.0*  NEUTROABS 7.0  --  5.6  --  6.8 8.7* 7.6  HGB 11.0* 9.4* 8.9* 9.0* 8.6* 9.3* 9.4*  HCT 32.9* 27.8* 26.1* 27.2* 26.6* 27.8* 29.0*  MCV 71.8* 70.9* 73.3*  --  74.1* 74.9* 74.7*  PLT 454* 438* 408*  --  454* 429* 469*    Liver Function Tests:  Recent Labs Lab 06/24/15 0500 06/25/15 0430 06/26/15 0550 06/27/15 0430 06/28/15 0253  AST 50* 42* 35 27 24  ALT 30 28 30 29 24   ALKPHOS 65 54 64 66 82  BILITOT 0.6 0.5 0.5 0.5 0.6  PROT 5.4* 5.4* 5.5* 5.8* 5.8*  ALBUMIN 2.8* 2.7* 2.7* 3.0* 3.0*     Recent Results (from the past 240 hour(s))  MRSA PCR Screening     Status: None   Collection Time: 06/22/15  8:48 PM  Result Value Ref Range Status   MRSA by PCR NEGATIVE NEGATIVE Final    Comment:        The GeneXpert MRSA Assay  (FDA approved for NASAL specimens only), is one component of a comprehensive MRSA colonization surveillance program. It is not intended to diagnose MRSA infection nor to guide or monitor treatment for MRSA infections.   Culture, Urine     Status: None   Collection Time: 06/23/15  5:00 PM  Result Value Ref Range Status   Specimen Description URINE, CATHETERIZED  Final   Special Requests NONE  Final   Culture 20,000 COLONIES/mL PROTEUS MIRABILIS  Final   Report Status 06/25/2015 FINAL  Final   Organism ID, Bacteria PROTEUS MIRABILIS  Final      Susceptibility   Proteus mirabilis - MIC*    AMPICILLIN <=2 SENSITIVE Sensitive     CEFAZOLIN <=4 SENSITIVE Sensitive  CEFTRIAXONE <=1 SENSITIVE Sensitive     CIPROFLOXACIN <=0.25 SENSITIVE Sensitive     GENTAMICIN <=1 SENSITIVE Sensitive     IMIPENEM 4 SENSITIVE Sensitive     NITROFURANTOIN 128 RESISTANT Resistant     TRIMETH/SULFA <=20 SENSITIVE Sensitive     AMPICILLIN/SULBACTAM <=2 SENSITIVE Sensitive     PIP/TAZO <=4 SENSITIVE Sensitive     * 20,000 COLONIES/mL PROTEUS MIRABILIS     Studies:   Recent x-ray studies have been reviewed in detail by the Attending Physician  Scheduled Meds:  Scheduled Meds: . cefTRIAXone (ROCEPHIN)  IV  1 g Intravenous Q24H  . clopidogrel  75 mg Oral Daily  . ferrous sulfate  325 mg Oral TID WC  . folic acid  1 mg Oral Daily  . lisinopril  2.5 mg Oral Daily  . metoprolol tartrate  12.5 mg Oral BID  . ondansetron  4 mg Oral Q12H  . pantoprazole  40 mg Oral BID AC  . thiamine  100 mg Oral Daily  . traMADol  50 mg Oral TID  . vitamin C  500 mg Oral BID    Time spent on care of this patient: 35 mins   Maxen Rowland T , MD   [facility] Office  [phone] Pager - Text Page per Shea Evans as per below:  On-Call/Text Page:      [email or web address]      password [email or web address]  If 7PM-7AM, please contact night-coverage [email or web address] Password [email or web address] 06/28/2015, 2:34 PM   LOS: 6 days

## 2015-06-28 NOTE — Clinical Social Work Placement (Signed)
   CLINICAL SOCIAL WORK PLACEMENT  NOTE  Date:  06/28/2015  Patient Details  Name: Aimee Jones MRN: [id number] Date of Birth: September 23, 1946  Clinical Social Work is seeking post-discharge placement for this patient at the [place] level of care (*CSW will initial, date and re-position this form in  chart as items are completed):  Yes   Patient/family provided with [place] Work Department's list of facilities offering this level of care within the geographic area requested by the patient (or if unable, by the patient's family).  Yes   Patient/family informed of their freedom to choose among providers that offer the needed level of care, that participate in Medicare, Medicaid or managed care program needed by the patient, have an available bed and are willing to accept the patient.  Yes   Patient/family informed of [facility]'s ownership interest in [facility] and [facility], as well as of the fact that they are under no obligation to receive care at these facilities.  PASRR submitted to EDS on 06/28/15     PASRR number received on 06/28/15     Existing PASRR number confirmed on       FL2 transmitted to all facilities in geographic area requested by pt/family on 06/28/15     FL2 transmitted to all facilities within larger geographic area on       Patient informed that his/her managed care company has contracts with or will negotiate with certain facilities, including the following:            Patient/family informed of bed offers received.  Patient chooses bed at       Physician recommends and patient chooses bed at      Patient to be transferred to   on  .  Patient to be transferred to facility by       Patient family notified on   of transfer.  Name of family member notified:        PHYSICIAN       Additional Comment:    _______________________________________________ Greta Doom, LCSW 06/28/2015, 2:27 PM

## 2015-06-28 NOTE — Progress Notes (Addendum)
Pt arrived to 5W in stable condition. VSS. Pt is oriented to person and place. Bruises are scattered. Foam dressing placed on sacrum for protection. Foam dressing changed on left elbow. Pt has a swollen, bruised knot on left side of forehead. Husband is at bedside. Speech is slurred. Bed alarm remains on. Hand mitts are on. Will continue to monitor pt. Bed remains in lowest position and call bell is within reach.

## 2015-06-28 NOTE — Clinical Social Work Note (Addendum)
CSW met the pt and pt's husband Gwyndolyn Saxon at the bedside. CSW introduced self and purpose of the visit. CSW discussed SNF rehab. CSW explained the SNF process. CSW explained insurance and its relation to SNF placement.  CSW provided Gwyndolyn Saxon with a SNF list. CSW answered all questions in which Gwyndolyn Saxon inquired about. CSW will continue to follow this pt and assist with discharge as needed.   CSW left a voice message for Florentina Jenny at [facility] regarding a bed opening.   Addendum:  Florentina Jenny informed the CSW that [facility] will not be able to accommodate the pt's need.   Addendum:  CSW spoke with the Gwyndolyn Saxon and Presenter, broadcasting), both agreed to [facility] SNF.   Addendum: CSW spoke with the Gwyndolyn Saxon and Seth Bake they informed the CSW that the changed their mind and would like the pt to go to [facility].   They family will meet with Janie at [facility] tomorrow at 1pm to complete the paperwork.     [place], MSW, [place]

## 2015-06-28 NOTE — Evaluation (Signed)
Occupational Therapy Evaluation Patient Details Name: Aimee Jones MRN: [id number] DOB: August 03, 1946 Today's Date: 06/28/2015    History of Present Illness 69 y.o. WF PMHx Alcoholism, Substance Abuse (extremely large amounts of narcotics and benzodiazepine's),Dementia, memory loss, Anxiety, Depression, Chronic Pain, HLD, HTN, CVA,.    Clinical Impression   Pt admitted with above. Pt independent with ADLs, PTA. Feel pt will benefit from acute OT to increase independence, strength, and activity tolerance prior to d/c.     Follow Up Recommendations  SNF;Supervision/Assistance - 24 hour    Equipment Recommendations  Other (comment) (defer to next venue)    Recommendations for Other Services       Precautions / Restrictions Precautions Precautions: Fall Restrictions Weight Bearing Restrictions: No      Mobility Bed Mobility Overal bed mobility: Needs Assistance Bed Mobility: Sidelying to Sit;Rolling;Sit to Supine Rolling: Supervision Sidelying to sit: Supervision   Sit to supine: Supervision   General bed mobility comments: cues for scooting HOB  Transfers Overall transfer level: Needs assistance   Transfers: Sit to/from Stand;Stand Pivot Transfers Sit to Stand: Min guard Stand pivot transfers: Min guard;Min assist            Balance  Min guard-Min assist for ambulation/stand pivot.                                          ADL Overall ADL's : Needs assistance/impaired     Grooming: Wash/dry face;Brushing hair;Set up;Supervision/safety;Sitting               Lower Body Dressing: Min guard;Sit to/from stand   Toilet Transfer: Min guard;Minimal assistance;Ambulation;Stand-pivot;BSC (pivoted to [facility], but also ambulated in room short distance)   Toileting- Water quality scientist and Hygiene: Min guard;Sit to/from stand (also performed bed level )       Functional mobility during ADLs: Minimal assistance;Min guard General ADL  Comments: Pt on bed pan when OT arrived. Pt also transferred from bed to [facility] to have another BM.      Vision     Perception     Praxis      Pertinent Vitals/Pain Pain Assessment: Faces Faces Pain Scale: Hurts little more Pain Location: bilateral shoulders Pain Intervention(s): Monitored during session;Other (comment) (notified nurse)   Monitor read RR around 40 in session, but went down. Cues for deep breathing technique.     Hand Dominance Right   Extremity/Trunk Assessment Upper Extremity Assessment Upper Extremity Assessment: Generalized weakness (reports pain in bilateral shoulders)   Lower Extremity Assessment Lower Extremity Assessment: Defer to PT evaluation       Communication Communication Communication: No difficulties   Cognition Arousal/Alertness: Awake/alert Behavior During Therapy: WFL for tasks assessed/performed Overall Cognitive Status: No family/caregiver present to determine baseline cognitive functioning (history of dementia)                     General Comments       Exercises       Shoulder Instructions      Home Living Family/patient expects to be discharged to:: Private residence Living Arrangements: Spouse/significant other Available Help at Discharge: Family;Available 24 hours/day Type of Home: House Home Access: Stairs to enter     Home Layout: One level     Bathroom Shower/Tub: Occupational psychologist: Standard     Home Equipment: Environmental consultant - 2 wheels;Cane - single point;Crutches;Walker -  4 wheels          Prior Functioning/Environment Level of Independence: Independent        Comments: Husband drives.     OT Diagnosis: Acute pain;Generalized weakness   OT Problem List: Decreased strength;Decreased knowledge of precautions;Decreased knowledge of use of DME or AE;Decreased activity tolerance;Impaired balance (sitting and/or standing);Decreased cognition;Decreased safety awareness;Pain   OT  Treatment/Interventions: Self-care/ADL training;DME and/or AE instruction;Energy conservation;Therapeutic exercise;Therapeutic activities;Cognitive remediation/compensation;Patient/family education;Balance training    OT Goals(Current goals can be found in the care plan section) Acute Rehab OT Goals Patient Stated Goal: not stated OT Goal Formulation: With patient Time For Goal Achievement: 07/05/15 Potential to Achieve Goals: Good ADL Goals Pt Will Perform Lower Body Bathing: with set-up;with supervision;sit to/from stand Pt Will Perform Lower Body Dressing: with set-up;with supervision;sit to/from stand Pt Will Transfer to Toilet: with supervision;ambulating;bedside commode Pt Will Perform Toileting - Clothing Manipulation and hygiene: with set-up;with supervision;sit to/from stand Additional ADL Goal #1: Pt will perform HEP for bilateral UEs to increase strength at supervision level.  OT Frequency: Min 2X/week   Barriers to D/C:            Co-evaluation              End of Session Equipment Utilized During Treatment: Gait belt Nurse Communication: Other (comment) (pt requesting meds to relax her; had BM)  Activity Tolerance: Patient tolerated treatment well Patient left: in bed;with call bell/phone within reach;with bed alarm set;with restraints reapplied   Time: 0913-0927 OT Time Calculation (min): 14 min Charges:  OT General Charges $OT Visit: 1 Procedure OT Evaluation $Initial OT Evaluation Tier I: 1 Procedure G-CodesBenito Mccreedy OTR/L [id number] 06/28/2015, 10:25 AM

## 2015-06-29 ENCOUNTER — Telehealth: Payer: Self-pay

## 2015-06-29 MED ORDER — LORAZEPAM 1 MG PO TABS: 2.0000 mg | ORAL_TABLET | Freq: Two times a day (BID) | ORAL | Status: DC | PRN
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
## 2015-06-29 NOTE — Progress Notes (Signed)
Report given to [facility], nurse at [facility]. All questions answered.

## 2015-06-29 NOTE — Telephone Encounter (Signed)
Appt made for 08/23/15 3:30 pm notified via letter

## 2015-06-29 NOTE — Telephone Encounter (Signed)
-----   Message from [name], MD sent at 06/25/2015  3:44 PM EDT ----- She needs rov with me in 6 weeks, hosp follow up  thanks
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
## 2015-06-29 NOTE — Progress Notes (Signed)
Physical Therapy Treatment Patient Details Name: Aimee Jones MRN: [id number] DOB: 20-Oct-1945 Today's Date: 06/29/2015    History of Present Illness 69 y.o. WF PMHx Alcoholism, Substance Abuse (extremely large amounts of narcotics and benzodiazepine's),Dementia, memory loss, Anxiety, Depression, Chronic Pain, HLD, HTN, CVA,.     PT Comments    Pt alert and pleasant during session.  She continues to have unsteadiness and multiple occurrences of LOB with ambulation requiring Min assist to prevent fall. Continue PT POC as indicated to address balance and mobility impairments and safety with functional activity.  Follow Up Recommendations  SNF     Equipment Recommendations  Rolling walker with 5" wheels    Recommendations for Other Services       Precautions / Restrictions Precautions Precautions: Fall Restrictions Weight Bearing Restrictions: No    Mobility  Bed Mobility Overal bed mobility: Needs Assistance Bed Mobility: Sit to Supine       Sit to supine: Supervision   General bed mobility comments: verbal cues for scooting toward [facility]  Transfers Overall transfer level: Needs assistance Equipment used: None Transfers: Sit to/from Stand Sit to Stand: Supervision         General transfer comment: verbal cueing to ensure positioned safely above bed before sitting  Ambulation/Gait Ambulation/Gait assistance: Min assist   Assistive device: None Gait Pattern/deviations: Step-through pattern;Drifts right/left Gait velocity: slow; able to increase some but requires frequent verbal cueing to do so Gait velocity interpretation: Below normal speed for age/gender General Gait Details: unsteadiness and multiple LOB requiring Min A to prevent fall   Stairs            Wheelchair Mobility    Modified Rankin (Stroke Patients Only)       Balance Overall balance assessment: History of Falls;Needs assistance Sitting-balance support: No upper extremity  supported;Feet supported Sitting balance-Leahy Scale: Good Sitting balance - Comments: able to perform seated hip flexion without UE support   Standing balance support: During functional activity Standing balance-Leahy Scale: Poor                      Cognition Arousal/Alertness: Awake/alert Behavior During Therapy: WFL for tasks assessed/performed Overall Cognitive Status: Difficult to assess Area of Impairment: Attention;Safety/judgement;Awareness;Problem solving     Memory: Decreased short-term memory   Safety/Judgement: Decreased awareness of safety;Decreased awareness of deficits   Problem Solving: Slow processing;Requires verbal cues;Requires tactile cues      Exercises General Exercises - Lower Extremity Ankle Circles/Pumps: Seated;10 reps;Both;AROM Long Arc Quad: Seated;10 reps;Both;AROM Hip Flexion/Marching: Seated;10 reps;Both;AROM    General Comments General comments (skin integrity, edema, etc.): educated on safety with ensuring chair/bed behind her prior to sitting      Pertinent Vitals/Pain Pain Assessment: Faces Faces Pain Scale: Hurts little more Pain Location: bilateral shoulders Pain Intervention(s): Monitored during session    Home Living                      Prior Function            PT Goals (current goals can now be found in the care plan section) Acute Rehab PT Goals PT Goal Formulation: Patient unable to participate in goal setting Time For Goal Achievement: 07/10/15 Potential to Achieve Goals: Good Progress towards PT goals: Progressing toward goals    Frequency  Min 3X/week    PT Plan Current plan remains appropriate    Co-evaluation             End  of Session Equipment Utilized During Treatment: Gait belt Activity Tolerance: Patient tolerated treatment well Patient left: in bed;with call bell/phone within reach;with bed alarm set;with family/visitor present     Time: 1000-1017 PT Time Calculation (min)  (ACUTE ONLY): 17 min  Charges:                       G CodesEstill Bamberg Jesyca Weisenburger 25-Jul-2015, 10:40 AM  [name], SPT

## 2015-06-29 NOTE — Clinical Social Work Placement (Signed)
   CLINICAL SOCIAL WORK PLACEMENT  NOTE  Date:  06/29/2015  Patient Details  Name: Aimee Jones MRN: [id number] Date of Birth: 1946-10-01  Clinical Social Work is seeking post-discharge placement for this patient at the [place] level of care (*CSW will initial, date and re-position this form in  chart as items are completed):  Yes   Patient/family provided with [facility] Work Department's list of facilities offering this level of care within the geographic area requested by the patient (or if unable, by the patient's family).  Yes   Patient/family informed of their freedom to choose among providers that offer the needed level of care, that participate in Medicare, Medicaid or managed care program needed by the patient, have an available bed and are willing to accept the patient.  Yes   Patient/family informed of [facility]'s ownership interest in [facility] and [facility], as well as of the fact that they are under no obligation to receive care at these facilities.  PASRR submitted to EDS on 06/28/15     PASRR number received on 06/28/15     Existing PASRR number confirmed on       FL2 transmitted to all facilities in geographic area requested by pt/family on 06/28/15     FL2 transmitted to all facilities within larger geographic area on       Patient informed that his/her managed care company has contracts with or will negotiate with certain facilities, including the following:        Yes   Patient/family informed of bed offers received.  Patient chooses bed at [facility]     Physician recommends and patient chooses bed at      Patient to be transferred to [facility] on 06/29/15.  Patient to be transferred to facility by Ambulance     Patient family notified on 06/29/15 of transfer.  Name of family member notified:  Gwyndolyn Saxon     PHYSICIAN       Additional Comment:   Per MD patient  ready for DC to [facility], patient, patient's family, and facility notified of DC. RN given number for report. DC packet on chart. Ambulance transport requested for patient. CSW signing off.  _______________________________________________ Liz Beach MSW, [place], [place], [phone]

## 2015-06-29 NOTE — Care Management Note (Signed)
Case Management Note  Patient Details  Name: Aimee Jones MRN: [id number] Date of Birth: May 01, 1946  Subjective/Objective:                DC to SNF, per  SW.    Action/Plan:   Expected Discharge Date:                  Expected Discharge Plan:  Skilled Nursing Facility  In-House Referral:  Clinical Social Work  Discharge planning Services  CM Consult  Post Acute Care Choice:    Choice offered to:     DME Arranged:    DME Agency:     HH Arranged:    [place] Agency:     Status of Service:  In process, will continue to follow  Medicare Important Message Given:  Yes-second notification given Date Medicare IM Given:    Medicare IM give by:    Date Additional Medicare IM Given:    Additional Medicare Important Message give by:     If discussed at [place] of Stay Meetings, dates discussed:    Additional Comments:  Carles Collet, RN 06/29/2015, 11:43 AM

## 2015-06-30 ENCOUNTER — Telehealth: Payer: Self-pay | Admitting: *Deleted

## 2015-06-30 NOTE — Telephone Encounter (Signed)
Called patient for TCM.  Patient was discharged from [facility] to SNF on 06/29/15.  Estimated length of stay is 2-4 weeks.  Will follow up once discharged to home.

## 2015-06-30 NOTE — Telephone Encounter (Signed)
Thanks

## 2015-07-04 ENCOUNTER — Emergency Department (HOSPITAL_COMMUNITY): Payer: Medicare Other

## 2015-07-04 ENCOUNTER — Encounter (HOSPITAL_COMMUNITY): Payer: Self-pay

## 2015-07-04 ENCOUNTER — Telehealth: Payer: Self-pay | Admitting: Family Medicine

## 2015-07-04 ENCOUNTER — Inpatient Hospital Stay (HOSPITAL_COMMUNITY)
Admission: EM | Admit: 2015-07-04 | Discharge: 2015-07-07 | DRG: 101 | Disposition: A | Discharge status: Home Health | Payer: Medicare Other | Attending: Internal Medicine | Admitting: Internal Medicine

## 2015-07-04 ENCOUNTER — Inpatient Hospital Stay (HOSPITAL_COMMUNITY): Payer: Medicare Other

## 2015-07-04 DIAGNOSIS — F329 Major depressive disorder, single episode, unspecified: Secondary | ICD-10-CM | POA: Diagnosis present

## 2015-07-04 DIAGNOSIS — D509 Iron deficiency anemia, unspecified: Secondary | ICD-10-CM | POA: Diagnosis present

## 2015-07-04 DIAGNOSIS — Z91040 Latex allergy status: Secondary | ICD-10-CM | POA: Diagnosis not present

## 2015-07-04 DIAGNOSIS — Z880 Allergy status to penicillin: Secondary | ICD-10-CM

## 2015-07-04 DIAGNOSIS — R569 Unspecified convulsions: Principal | ICD-10-CM

## 2015-07-04 DIAGNOSIS — E860 Dehydration: Secondary | ICD-10-CM | POA: Diagnosis present

## 2015-07-04 DIAGNOSIS — E785 Hyperlipidemia, unspecified: Secondary | ICD-10-CM | POA: Diagnosis present

## 2015-07-04 DIAGNOSIS — Z79899 Other long term (current) drug therapy: Secondary | ICD-10-CM

## 2015-07-04 DIAGNOSIS — J690 Pneumonitis due to inhalation of food and vomit: Secondary | ICD-10-CM

## 2015-07-04 DIAGNOSIS — F1027 Alcohol dependence with alcohol-induced persisting dementia: Secondary | ICD-10-CM | POA: Diagnosis present

## 2015-07-04 DIAGNOSIS — Z8673 Personal history of transient ischemic attack (TIA), and cerebral infarction without residual deficits: Secondary | ICD-10-CM

## 2015-07-04 DIAGNOSIS — I10 Essential (primary) hypertension: Secondary | ICD-10-CM | POA: Diagnosis present

## 2015-07-04 DIAGNOSIS — S0012XA Contusion of left eyelid and periocular area, initial encounter: Secondary | ICD-10-CM | POA: Diagnosis present

## 2015-07-04 DIAGNOSIS — E871 Hypo-osmolality and hyponatremia: Secondary | ICD-10-CM | POA: Diagnosis not present

## 2015-07-04 DIAGNOSIS — Z79891 Long term (current) use of opiate analgesic: Secondary | ICD-10-CM | POA: Diagnosis not present

## 2015-07-04 DIAGNOSIS — M19012 Primary osteoarthritis, left shoulder: Secondary | ICD-10-CM | POA: Diagnosis present

## 2015-07-04 DIAGNOSIS — N183 Chronic kidney disease, stage 3 unspecified: Secondary | ICD-10-CM | POA: Diagnosis present

## 2015-07-04 DIAGNOSIS — Z96611 Presence of right artificial shoulder joint: Secondary | ICD-10-CM | POA: Diagnosis present

## 2015-07-04 DIAGNOSIS — N390 Urinary tract infection, site not specified: Secondary | ICD-10-CM

## 2015-07-04 DIAGNOSIS — Z96643 Presence of artificial hip joint, bilateral: Secondary | ICD-10-CM | POA: Diagnosis present

## 2015-07-04 DIAGNOSIS — R410 Disorientation, unspecified: Secondary | ICD-10-CM | POA: Diagnosis not present

## 2015-07-04 DIAGNOSIS — R51 Headache: Secondary | ICD-10-CM | POA: Diagnosis not present

## 2015-07-04 DIAGNOSIS — I129 Hypertensive chronic kidney disease with stage 1 through stage 4 chronic kidney disease, or unspecified chronic kidney disease: Secondary | ICD-10-CM | POA: Diagnosis present

## 2015-07-04 DIAGNOSIS — IMO0001 Reserved for inherently not codable concepts without codable children: Secondary | ICD-10-CM | POA: Diagnosis present

## 2015-07-04 DIAGNOSIS — D72829 Elevated white blood cell count, unspecified: Secondary | ICD-10-CM | POA: Diagnosis present

## 2015-07-04 DIAGNOSIS — M25511 Pain in right shoulder: Secondary | ICD-10-CM | POA: Diagnosis present

## 2015-07-04 DIAGNOSIS — Z823 Family history of stroke: Secondary | ICD-10-CM

## 2015-07-04 DIAGNOSIS — R030 Elevated blood-pressure reading, without diagnosis of hypertension: Secondary | ICD-10-CM | POA: Diagnosis not present

## 2015-07-04 DIAGNOSIS — B379 Candidiasis, unspecified: Secondary | ICD-10-CM | POA: Diagnosis not present

## 2015-07-04 DIAGNOSIS — K219 Gastro-esophageal reflux disease without esophagitis: Secondary | ICD-10-CM | POA: Diagnosis present

## 2015-07-04 DIAGNOSIS — J45909 Unspecified asthma, uncomplicated: Secondary | ICD-10-CM | POA: Diagnosis not present

## 2015-07-04 DIAGNOSIS — M25512 Pain in left shoulder: Secondary | ICD-10-CM

## 2015-07-04 DIAGNOSIS — W1830XA Fall on same level, unspecified, initial encounter: Secondary | ICD-10-CM | POA: Diagnosis present

## 2015-07-04 DIAGNOSIS — F419 Anxiety disorder, unspecified: Secondary | ICD-10-CM | POA: Diagnosis present

## 2015-07-04 DIAGNOSIS — Z7951 Long term (current) use of inhaled steroids: Secondary | ICD-10-CM

## 2015-07-04 DIAGNOSIS — R52 Pain, unspecified: Secondary | ICD-10-CM

## 2015-07-04 HISTORY — DX: Unspecified convulsions: R56.9

## 2015-07-04 HISTORY — DX: Hypo-osmolality and hyponatremia: E87.1

## 2015-07-04 LAB — CBC WITH DIFFERENTIAL/PLATELET
Basophils Absolute: 0 K/uL (ref 0.0–0.1)
Basophils Relative: 0 %
Eosinophils Absolute: 0.3 K/uL (ref 0.0–0.7)
Eosinophils Relative: 2 %
HCT: 31.1 % — ABNORMAL LOW (ref 36.0–46.0)
Hemoglobin: 10 g/dL — ABNORMAL LOW (ref 12.0–15.0)
Lymphocytes Relative: 9 %
Lymphs Abs: 1.3 K/uL (ref 0.7–4.0)
MCH: 24.5 pg — ABNORMAL LOW (ref 26.0–34.0)
MCHC: 32.2 g/dL (ref 30.0–36.0)
MCV: 76.2 fL — ABNORMAL LOW (ref 78.0–100.0)
Monocytes Absolute: 0.9 K/uL (ref 0.1–1.0)
Monocytes Relative: 6 %
Neutro Abs: 11.2 K/uL — ABNORMAL HIGH (ref 1.7–7.7)
Neutrophils Relative %: 83 %
Platelets: 672 K/uL — ABNORMAL HIGH (ref 150–400)
RBC: 4.08 MIL/uL (ref 3.87–5.11)
RDW: 21 % — ABNORMAL HIGH (ref 11.5–15.5)
WBC: 13.6 K/uL — ABNORMAL HIGH (ref 4.0–10.5)

## 2015-07-04 LAB — COMPREHENSIVE METABOLIC PANEL WITH GFR
ALT: 24 U/L (ref 14–54)
AST: 30 U/L (ref 15–41)
Albumin: 3.3 g/dL — ABNORMAL LOW (ref 3.5–5.0)
Alkaline Phosphatase: 82 U/L (ref 38–126)
Anion gap: 11 (ref 5–15)
BUN: 6 mg/dL (ref 6–20)
CO2: 20 mmol/L — ABNORMAL LOW (ref 22–32)
Calcium: 8.7 mg/dL — ABNORMAL LOW (ref 8.9–10.3)
Chloride: 91 mmol/L — ABNORMAL LOW (ref 101–111)
Creatinine, Ser: 0.96 mg/dL (ref 0.44–1.00)
GFR calc Af Amer: >60 mL/min
GFR calc non Af Amer: 59 mL/min — ABNORMAL LOW
Glucose, Bld: 115 mg/dL — ABNORMAL HIGH (ref 65–99)
Potassium: 3.4 mmol/L — ABNORMAL LOW (ref 3.5–5.1)
Sodium: 122 mmol/L — ABNORMAL LOW (ref 135–145)
Total Bilirubin: 0.4 mg/dL (ref 0.3–1.2)
Total Protein: 6.1 g/dL — ABNORMAL LOW (ref 6.5–8.1)

## 2015-07-04 LAB — URINALYSIS, ROUTINE W REFLEX MICROSCOPIC
BILIRUBIN URINE: NEGATIVE
Glucose, UA: NEGATIVE mg/dL
HGB URINE DIPSTICK: NEGATIVE
Ketones, ur: NEGATIVE mg/dL
Nitrite: NEGATIVE
PH: 6 (ref 5.0–8.0)
Protein, ur: 30 mg/dL — AB
SPECIFIC GRAVITY, URINE: 1.01 (ref 1.005–1.030)
Urobilinogen, UA: 0.2 mg/dL (ref 0.0–1.0)

## 2015-07-04 LAB — MAGNESIUM: Magnesium: 1.7 mg/dL (ref 1.7–2.4)

## 2015-07-04 LAB — URINE MICROSCOPIC-ADD ON

## 2015-07-04 LAB — NA AND K (SODIUM & POTASSIUM), RAND UR
POTASSIUM UR: 27 mmol/L
SODIUM UR: 45 mmol/L

## 2015-07-04 LAB — CBG MONITORING, ED: Glucose-Capillary: 122 mg/dL — ABNORMAL HIGH (ref 65–99)

## 2015-07-04 LAB — ETHANOL: Alcohol, Ethyl (B): <5 mg/dL (ref <5)

## 2015-07-04 MED ORDER — HYDROMORPHONE HCL 1 MG/ML IJ SOLN
0.5000 mg | INTRAMUSCULAR | Status: DC | PRN
  Administered 2015-07-05 – 2015-07-06 (×2): 1 mg via INTRAVENOUS
  Filled 2015-07-04 (×3): qty 1

## 2015-07-04 MED ORDER — ATORVASTATIN CALCIUM 10 MG PO TABS
10.0000 mg | ORAL_TABLET | Freq: Every day | ORAL | Status: DC
  Administered 2015-07-05 – 2015-07-06 (×2): 10 mg via ORAL
  Filled 2015-07-04 (×3): qty 1

## 2015-07-04 MED ORDER — SODIUM CHLORIDE 0.9 % IV SOLN
INTRAVENOUS | Status: DC
  Administered 2015-07-05 (×2): via INTRAVENOUS

## 2015-07-04 MED ORDER — B COMPLEX-C PO TABS
1.0000 | ORAL_TABLET | Freq: Every day | ORAL | Status: DC
  Administered 2015-07-05 – 2015-07-07 (×3): 1 via ORAL
  Filled 2015-07-04 (×5): qty 1

## 2015-07-04 MED ORDER — ALPRAZOLAM 0.5 MG PO TABS
0.5000 mg | ORAL_TABLET | Freq: Three times a day (TID) | ORAL | Status: DC | PRN
  Administered 2015-07-05: 0.5 mg via ORAL
  Filled 2015-07-04: qty 1

## 2015-07-04 MED ORDER — OMEGA-3-ACID ETHYL ESTERS 1 G PO CAPS
1000.0000 mg | ORAL_CAPSULE | Freq: Every evening | ORAL | Status: DC
  Administered 2015-07-05 – 2015-07-06 (×2): 1000 mg via ORAL
  Filled 2015-07-04 (×3): qty 1

## 2015-07-04 MED ORDER — ENOXAPARIN SODIUM 40 MG/0.4ML ~~LOC~~ SOLN
40.0000 mg | Freq: Every day | SUBCUTANEOUS | Status: DC
  Administered 2015-07-05 – 2015-07-07 (×3): 40 mg via SUBCUTANEOUS
  Filled 2015-07-04 (×5): qty 0.4

## 2015-07-04 MED ORDER — SODIUM CHLORIDE 0.9 % IJ SOLN
3.0000 mL | Freq: Two times a day (BID) | INTRAMUSCULAR | Status: DC
  Administered 2015-07-05 – 2015-07-07 (×4): 3 mL via INTRAVENOUS

## 2015-07-04 MED ORDER — ACETAMINOPHEN 325 MG PO TABS
650.0000 mg | ORAL_TABLET | Freq: Four times a day (QID) | ORAL | Status: DC | PRN
  Administered 2015-07-05 – 2015-07-07 (×7): 650 mg via ORAL
  Filled 2015-07-04 (×8): qty 2

## 2015-07-04 MED ORDER — MONTELUKAST SODIUM 10 MG PO TABS
10.0000 mg | ORAL_TABLET | Freq: Every day | ORAL | Status: DC
  Administered 2015-07-05 – 2015-07-06 (×3): 10 mg via ORAL
  Filled 2015-07-04 (×4): qty 1

## 2015-07-04 MED ORDER — ALUM & MAG HYDROXIDE-SIMETH 200-200-20 MG/5ML PO SUSP: 30.0000 mL | Freq: Four times a day (QID) | ORAL | Status: DC | PRN

## 2015-07-04 MED ORDER — ONDANSETRON HCL 4 MG/2ML IJ SOLN
4.0000 mg | Freq: Four times a day (QID) | INTRAMUSCULAR | Status: DC | PRN
  Administered 2015-07-06 – 2015-07-07 (×2): 4 mg via INTRAVENOUS
  Filled 2015-07-04 (×2): qty 2

## 2015-07-04 MED ORDER — SODIUM CHLORIDE 0.9 % IV SOLN
INTRAVENOUS | Status: AC
  Administered 2015-07-04: 23:00:00 via INTRAVENOUS

## 2015-07-04 MED ORDER — ACETAMINOPHEN 650 MG RE SUPP
650.0000 mg | Freq: Four times a day (QID) | RECTAL | Status: DC | PRN
Start: 2015-07-04 — End: 2015-07-07

## 2015-07-04 MED ORDER — ONDANSETRON HCL 4 MG PO TABS: 4.0000 mg | ORAL_TABLET | Freq: Four times a day (QID) | ORAL | Status: DC | PRN

## 2015-07-04 MED ORDER — SODIUM CHLORIDE 0.9 % IV BOLUS (SEPSIS)
500.0000 mL | Freq: Once | INTRAVENOUS | Status: AC
  Administered 2015-07-04: 500 mL via INTRAVENOUS

## 2015-07-04 MED ORDER — OXYCODONE HCL 5 MG PO TABS
5.0000 mg | ORAL_TABLET | ORAL | Status: DC | PRN
  Administered 2015-07-05 – 2015-07-07 (×4): 5 mg via ORAL
  Filled 2015-07-04 (×8): qty 1

## 2015-07-04 MED ORDER — LORAZEPAM 2 MG/ML IJ SOLN
1.0000 mg | INTRAMUSCULAR | Status: DC | PRN
  Administered 2015-07-05: 1 mg via INTRAVENOUS
  Filled 2015-07-04: qty 1

## 2015-07-04 MED ORDER — PANTOPRAZOLE SODIUM 40 MG PO TBEC
40.0000 mg | DELAYED_RELEASE_TABLET | Freq: Every day | ORAL | Status: DC
  Administered 2015-07-05 – 2015-07-07 (×3): 40 mg via ORAL
  Filled 2015-07-04 (×3): qty 1

## 2015-07-04 MED ORDER — BACLOFEN 10 MG PO TABS
10.0000 mg | ORAL_TABLET | Freq: Two times a day (BID) | ORAL | Status: DC | PRN
  Filled 2015-07-04: qty 1

## 2015-07-04 MED ORDER — DONEPEZIL HCL 10 MG PO TABS
10.0000 mg | ORAL_TABLET | Freq: Every day | ORAL | Status: DC
  Administered 2015-07-05 – 2015-07-06 (×3): 10 mg via ORAL
  Filled 2015-07-04 (×2): qty 1
  Filled 2015-07-04: qty 2

## 2015-07-04 MED ORDER — DOXEPIN HCL 25 MG PO CAPS
25.0000 mg | ORAL_CAPSULE | Freq: Every day | ORAL | Status: DC
  Administered 2015-07-05: 25 mg via ORAL
  Filled 2015-07-04 (×2): qty 1

## 2015-07-04 MED ORDER — ADULT MULTIVITAMIN W/MINERALS CH
1.0000 | ORAL_TABLET | Freq: Every evening | ORAL | Status: DC
  Administered 2015-07-05 – 2015-07-06 (×2): 1 via ORAL
  Filled 2015-07-04 (×2): qty 1

## 2015-07-04 MED ORDER — CLOPIDOGREL BISULFATE 75 MG PO TABS
75.0000 mg | ORAL_TABLET | Freq: Every day | ORAL | Status: DC
  Administered 2015-07-05 – 2015-07-07 (×3): 75 mg via ORAL
  Filled 2015-07-04 (×3): qty 1

## 2015-07-04 MED ORDER — VITAMIN B-1 100 MG PO TABS
100.0000 mg | ORAL_TABLET | Freq: Every day | ORAL | Status: DC
  Administered 2015-07-05 – 2015-07-07 (×3): 100 mg via ORAL
  Filled 2015-07-04 (×3): qty 1

## 2015-07-04 NOTE — ED Notes (Signed)
MRI called to report pt has implanted loop recorder.

## 2015-07-04 NOTE — ED Notes (Signed)
CBG 122 
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
## 2015-07-04 NOTE — ED Notes (Signed)
Pt arrived via EMS from [place] facility c/o seizure without injury or fall.  Pt had witnessed seizure lasting 1-2 minutes while laying in her bed.  Pt presents with multiple bruises from a fall about 2 weeks ago.  EMS gave 4mg  Zofran 1811.

## 2015-07-04 NOTE — Consult Note (Signed)
Neurology Consultation Reason for Consult: Seizure Referring Physician: Rosalyn Gess  CC: Seizure  History is obtained from: Patient, husband  HPI: Aimee Jones is a 69 y.o. female with a history of dementia who was in her normal state until this afternoon when she had a seizure. It started with intermittent leftward gaze followed by versive head turning to the left with secondary generalization. She did not have tongue biting or urine incontinence. She had a gradual return to her normal self, though husband states that even now she seems more confused than is typical.   OF note she was recently admitted to the hospital with significant changes to her meds including stopping xanax. Last dose was 09/07.  ROS: A 14 point ROS was performed and is negative except as noted in the HPI.   Past Medical History  Diagnosis Date  . Hyperlipidemia   . ETOH abuse   . Allergy   . Diverticulosis   . Hypertension   . Depression   . Asthma   . GERD (gastroesophageal reflux disease)   . Normal 24 hour ambulatory pH monitoring study     good acid suppression  . Dementia   . Shortness of breath   . Anxiety     panic attacks- on occas.   . Arthritis     osteoporosis - especially hips.   . Stroke   . Memory loss   . Headache   . Confusion      Family History  Problem Relation Age of Onset  . Sudden death Brother   . Heart attack Brother   . Hypertension Brother   . Esophageal cancer Brother   . Heart attack Father   . Heart block Mother   . Stroke Brother   . Stroke Mother      Social History:  reports that she has never smoked. She has never used smokeless tobacco. She reports that she drinks alcohol. She reports that she does not use illicit drugs.   Exam: Current vital signs: BP 143/90 mmHg  Pulse 97  Temp(Src) 99.2 F (37.3 C) (Oral)  Resp 16  SpO2 96% Vital signs in last 24 hours: Temp:  [99.2 F (37.3 C)] 99.2 F (37.3 C) (09/18 1829) Pulse Rate:  [97-104] 97  (09/18 2000) Resp:  [13-20] 16 (09/18 2000) BP: (135-146)/(75-90) 143/90 mmHg (09/18 2000) SpO2:  [96 %-97 %] 96 % (09/18 2000)   Physical Exam  Constitutional: Appears well-developed and well-nourished.  Psych: Affect appropriate to situation Eyes: No scleral injection HENT: No OP obstrucion Head: Normocephalic.  Cardiovascular: Normal rate and regular rhythm.  Respiratory: Effort normal and breath sounds normal to anterior ascultation GI: Soft.  No distension. There is no tenderness.  Skin: WDI  Neuro: Mental Status: Patient is awake, alert, oriented to person only."march or April" and "1970" No signs of aphasia or neglect Cranial Nerves: II: Visual Fields are full. Pupils are equal, round, and reactive to light.   III,IV, VI: EOMI without ptosis or diploplia.  V: Facial sensation is symmetric to temperature VII: Facial movement is symmetric.  VIII: hearing is intact to voice X: Uvula elevates symmetrically XI: Shoulder shrug is symmetric. XII: tongue is midline without atrophy or fasciculations.  Motor: Tone is normal. Bulk is normal. 5/5 strength was present in all four extremities.  Sensory: Sensation is symmetric to light touch and temperature in the arms and legs. Cerebellar: No clear ataxia   I have reviewed labs in epic and the results pertinent to this  consultation are: Na 122  I have reviewed the images obtained: CT head 09/06 -  Hematoma/white matter disease.   Impression: 69 yo F with new onset seizure in the setting of hyponatremia, recently starting tramadol, recently stopping xanax, dementia, previous stroke, possible continued UTI. With increased confusion I think an MRI brain could be helpful. If she were to show clear seizure predisposition, then starting an AED would be needed. If not, then I don't think that it is a clear decision. She has multiple possible contributing factors which could label this a provoked seizure, but no clear "smoking gun" as her  sodium is not terribly low, tramadol dose not terribly high, xanax has been off now for over a week, etc.   I discussed AEDs with the husband and indicated that I did not feel absolute that she needed an AED at this time given possible provoking mechanisms, but that it might be reasonable for some people. He is considering this pending other studies.   Recommendations: 1) Discontinue tramadol 2) EEG 3) MRI brain 4) will continue to follow.    Roland Rack, MD [facility] [phone]  If 7pm- 7am, please page neurology on call as listed in [place].

## 2015-07-04 NOTE — ED Notes (Signed)
Patient transported to MRI 

## 2015-07-04 NOTE — ED Provider Notes (Signed)
CSN: [id number]     Arrival date & time 07/04/15  1818 History   First MD Initiated Contact with Patient 07/04/15 1826     Chief Complaint  Patient presents with  . Seizures     (Consider location/radiation/quality/duration/timing/severity/associated sxs/prior Treatment) HPI Comments: 70 year old female with history of alcohol abuse, benzo abuse, parkinsonism, dementia, electrolyte abnormalities, depression, recent fall and significant head injury with nursing home placement presents after witnessed 45 minute generalized seizure activity. No history of seizures in the past. Patient is on tramadol for pain. Per report and family medicines are monitored and no change she is allowed up to 3 Xanax a day and had her Xanax this afternoon. No alcohol use for couple weeks. Stroke history. Patient is still mild confused generally no focal deficits. Occurred prior to arrival. Patient recently being treated for urine infection.  Patient is a 69 y.o. female presenting with seizures. The history is provided by the patient.  Seizures   Past Medical History  Diagnosis Date  . Hyperlipidemia   . ETOH abuse   . Allergy   . Diverticulosis   . Hypertension   . Depression   . Asthma   . GERD (gastroesophageal reflux disease)   . Normal 24 hour ambulatory pH monitoring study     good acid suppression  . Dementia   . Shortness of breath   . Anxiety     panic attacks- on occas.   . Arthritis     osteoporosis - especially hips.   . Stroke   . Memory loss   . Headache   . Confusion    Past Surgical History  Procedure Laterality Date  . Dobutamine stress echo  07/09/2001    normal  . Esophagogastroduodenoscopy  06/11/2006    normal  . Abdominal hysterectomy    . Total hip arthroplasty      left 1991/right 2008  . Nasal sinus surgery  1990  . Eye surgery      cataracts removed, IOL- both eyes   . Breast surgery      3x- biposies   . Shoulder arthroscopy Left   . Reverse shoulder arthroplasty  Right 08/13/2014    Procedure: REVERSE SHOULDER ARTHROPLASTY;  Surgeon: Marin Shutter, MD;  Location: [facility];  Service: Orthopedics;  Laterality: Right;  interscalene block  . Joint replacement      bilateral hips  . Loop recorder implant N/A 10/14/2014    Procedure: LOOP RECORDER IMPLANT;  Surgeon: Evans Lance, MD;  Location: [facility] CATH LAB;  Service: Cardiovascular;  Laterality: N/A;  . Tee without cardioversion N/A 10/14/2014    Procedure: TRANSESOPHAGEAL ECHOCARDIOGRAM (TEE);  Surgeon: Sueanne Margarita, MD;  Location: [facility] ENDOSCOPY;  Service: Cardiovascular;  Laterality: N/A;   Family History  Problem Relation Age of Onset  . Sudden death Brother   . Heart attack Brother   . Hypertension Brother   . Esophageal cancer Brother   . Heart attack Father   . Heart block Mother   . Stroke Brother   . Stroke Mother    Social History  Substance Use Topics  . Smoking status: Never Smoker   . Smokeless tobacco: Never Used  . Alcohol Use: 0.0 oz/week    0 Standard drinks or equivalent per week     Comment: grand Mariener - almost daily- 1/2 shot   OB History    No data available     Review of Systems  Constitutional: Negative for fever and chills.  HENT: Negative  for congestion.   Eyes: Negative for visual disturbance.  Respiratory: Negative for shortness of breath.   Cardiovascular: Negative for chest pain.  Gastrointestinal: Negative for vomiting and abdominal pain.  Genitourinary: Negative for dysuria and flank pain.  Musculoskeletal: Negative for back pain, neck pain and neck stiffness.  Skin: Negative for rash.  Neurological: Positive for seizures and weakness. Negative for light-headedness and headaches.  Psychiatric/Behavioral: Positive for confusion.      Allergies  Latex and Penicillins  Home Medications   Prior to Admission medications   Medication Sig Start Date End Date Taking? Authorizing Provider  acetaminophen (TYLENOL) 500 MG tablet Take 500 mg by mouth  every 6 (six) hours as needed for mild pain.   Yes Historical Provider, MD  ALPRAZolam Duanne Moron) 0.5 MG tablet TAKE 1 TABLET 3 TIMES A DAY AS NEEDED 05/16/15  Yes Tonia Ghent, MD  atorvastatin (LIPITOR) 10 MG tablet Take 10 mg by mouth daily at 6 PM.    Yes Historical Provider, MD  azelastine (ASTELIN) 0.1 % nasal spray Place into both nostrils 2 (two) times daily. Use in each nostril as directed   Yes Historical Provider, MD  b complex vitamins tablet Take 1 tablet by mouth daily.   Yes Historical Provider, MD  baclofen (LIORESAL) 10 MG tablet Take 10 mg by mouth 2 (two) times daily as needed for muscle spasms.  06/12/15  Yes Historical Provider, MD  barrier cream (NON-SPECIFIED) CREA Apply 1 application topically 4 (four) times daily as needed (for periarea).   Yes Historical Provider, MD  budesonide-formoterol (SYMBICORT) 160-4.5 MCG/ACT inhaler Inhale 2 puffs into the lungs 2 (two) times daily as needed (shortness of breath). 08/12/14  Yes Tonia Ghent, MD  folic acid (FOLVITE) 1 MG tablet Take 1 mg by mouth daily.   Yes Historical Provider, MD  guaiFENesin (MUCINEX) 600 MG 12 hr tablet Take 2 tablets (1,200 mg total) by mouth 2 (two) times daily. 10/14/14  Yes Maryann Mikhail, DO  montelukast (SINGULAIR) 10 MG tablet Take 10 mg by mouth at bedtime.  02/21/13  Yes Historical Provider, MD  Multiple Vitamin (MULTIVITAMIN WITH MINERALS) TABS tablet Take 1 tablet by mouth every evening.   Yes Historical Provider, MD  nystatin cream (MYCOSTATIN) Apply 1 application topically 2 (two) times daily.   Yes Historical Provider, MD  Omega 3 1000 MG CAPS Take 1,000 mg by mouth every evening.   Yes Historical Provider, MD  omeprazole (PRILOSEC) 20 MG capsule TAKE 1 CAPSULE TWICE DAILY FOR 10 DAYS, THEN DECREASE TO NORMAL ONCE DAILY DOSING 05/04/15  Yes Tonia Ghent, MD  promethazine (PHENERGAN) 25 MG tablet TAKE 1 TABLET BY MOUTH EVERY 6 HOURS AS NEEDED FOR NAUSEA 06/11/15  Yes Tonia Ghent, MD  thiamine  100 MG tablet Take 100 mg by mouth daily.   Yes Historical Provider, MD  traMADol (ULTRAM) 50 MG tablet Take 50 mg by mouth 3 (three) times daily.   Yes Historical Provider, MD  vitamin E 400 UNIT capsule Take 400 Units by mouth daily.   Yes Historical Provider, MD  Aspirin-Salicylamide-Caffeine (BC HEADACHE POWDER PO) Take 2 packets by mouth daily as needed (headache).     Historical Provider, MD  citalopram (CELEXA) 40 MG tablet Take 1 tablet (40 mg total) by mouth at bedtime. 04/07/15   Tonia Ghent, MD  clopidogrel (PLAVIX) 75 MG tablet Take 1 tablet (75 mg total) by mouth daily. 12/11/14   Rosalin Hawking, MD  donepezil (ARICEPT) 10 MG tablet  TAKE 1 TABLET BY MOUTH DAILY AT BEDTIME 12/11/14   Tonia Ghent, MD  doxepin [facility]) 25 MG capsule TAKE 1 CAPSULE BY MOUTH DAILY AT BEDTIME 05/10/15   Tonia Ghent, MD  fentaNYL (DURAGESIC - DOSED MCG/HR) 25 MCG/HR patch Place 25 mcg onto the skin every 3 (three) days.  03/16/15   Historical Provider, MD  fish oil-omega-3 fatty acids 1000 MG capsule Take 1 g by mouth daily.     Historical Provider, MD  fluticasone (FLONASE) 50 MCG/ACT nasal spray Place 1-2 sprays into both nostrils daily. Patient taking differently: Place 1-2 sprays into both nostrils daily as needed for allergies.  03/16/15   Tonia Ghent, MD  Multiple Vitamins-Minerals (MULTIVITAMIN PO) Take 1 tablet by mouth daily.    Historical Provider, MD  nabumetone (RELAFEN) 750 MG tablet TAKE 1 TABLET BY MOUTH 2 TIMES DAILY AS NEEDED FOR PAIN. 05/26/15   Tonia Ghent, MD  ondansetron (ZOFRAN) 4 MG tablet Take 4 mg by mouth every morning.    Historical Provider, MD  oxyCODONE (ROXICODONE) 15 MG immediate release tablet Take 15 mg by mouth every 3 (three) hours. 06/09/15   Historical Provider, MD  VITAMIN E PO Take 1 capsule by mouth daily.     Historical Provider, MD   BP 155/84 mmHg  Pulse 96  Temp(Src) 99.2 F (37.3 C) (Oral)  Resp 12  SpO2 99% Physical Exam  Constitutional: She is  oriented to person, place, and time. She appears well-developed and well-nourished.  HENT:  Head: Normocephalic.  Patient has ecchymosis contusion and swelling left periorbital region left lateral frontal from previous fall over one week ago.  Eyes: Right eye exhibits no discharge. Left eye exhibits no discharge.  Neck: Normal range of motion. Neck supple. No tracheal deviation present.  Cardiovascular: Regular rhythm.  Tachycardia present.   Pulmonary/Chest: Effort normal and breath sounds normal.  Abdominal: Soft. She exhibits no distension. There is no tenderness. There is no guarding.  Musculoskeletal: She exhibits edema (mild LE bilateral ankles).  Neurological: She is alert and oriented to person, place, and time. GCS eye subscore is 4. GCS verbal subscore is 4. GCS motor subscore is 6.  Patient alert however not fully oriented to place or date. Patient answers majority of questions appropriately however difficult to short and long-term memory and looks to family for answers. Patient has grossly equal strength upper lower extremities sensation to palpation intact in all extremities, eczema the muscle function intact pupils equal bilateral neck supple.  Skin: Skin is warm. No rash noted.  Psychiatric:  confusion  Nursing note and vitals reviewed.   ED Course  Procedures (including critical care time) Labs Review Labs Reviewed  CBC WITH DIFFERENTIAL/PLATELET - Abnormal; Notable for the following:    WBC 13.6 (*)    Hemoglobin 10.0 (*)    HCT 31.1 (*)    MCV 76.2 (*)    MCH 24.5 (*)    RDW 21.0 (*)    Platelets 672 (*)    Neutro Abs 11.2 (*)    All other components within normal limits  COMPREHENSIVE METABOLIC PANEL - Abnormal; Notable for the following:    Sodium 122 (*)    Potassium 3.4 (*)    Chloride 91 (*)    CO2 20 (*)    Glucose, Bld 115 (*)    Calcium 8.7 (*)    Total Protein 6.1 (*)    Albumin 3.3 (*)    GFR calc non Af Amer 59 (*)  All other components within  normal limits  URINALYSIS, ROUTINE W REFLEX MICROSCOPIC (NOT AT [facility]) - Abnormal; Notable for the following:    APPearance CLOUDY (*)    Protein, ur 30 (*)    Leukocytes, UA TRACE (*)    All other components within normal limits  URINE MICROSCOPIC-ADD ON - Abnormal; Notable for the following:    Squamous Epithelial / LPF FEW (*)    All other components within normal limits  CBG MONITORING, ED - Abnormal; Notable for the following:    Glucose-Capillary 122 (*)    All other components within normal limits  MAGNESIUM  ETHANOL  CBG MONITORING, ED    Imaging Review Ct Head Wo Contrast  07/04/2015   CLINICAL DATA:  Fall June 22, 2015 with left-sided bruising. Increased confusion and pain since the accident. Subsequent encounter.  EXAM: CT HEAD WITHOUT CONTRAST  TECHNIQUE: Contiguous axial images were obtained from the base of the skull through the vertex without intravenous contrast.  COMPARISON:  06/22/2015  FINDINGS: Persistent hematoma in the left forehead. No new site of swelling. No acute fracture. No sinus or mastoid fluid (where seen).  Orbits: Bilateral cataract resection.  No traumatic finding.  Brain: No evidence of acute infarction, hemorrhage, hydrocephalus, or mass lesion/mass effect.  Moderate chronic small vessel disease with patchy ischemic gliosis in the cerebral white matter, confluent around the lateral ventricles.  Cerebral volume loss, typical for age.  IMPRESSION: No acute finding or change from prior. No evidence of intracranial injury.   Electronically Signed   By: Monte Fantasia M.D.   On: 07/04/2015 21:33   I have personally reviewed and evaluated these images and lab results as part of my medical decision-making.   EKG Interpretation   Date/Time:  Sunday July 04 2015 18:25:54 EDT Ventricular Rate:  103 PR Interval:  169 QRS Duration: 94 QT Interval:  380 QTC Calculation: 497 R Axis:   -62 Text Interpretation:  Sinus tachycardia Left axis deviation  Probable  anterolateral infarct, age indeterm Confirmed by ZAVITZ  MD, JOSHUA ([id number])  on 07/04/2015 7:15:40 PM      MDM   Final diagnoses:  Hyponatremia  Seizure-like activity  Confusion   Patient presents with witnessed generalized seizure. Patient has multiple reasons for seizure including current infection, tramadol use, hyponatremia, recent head injury. Discussed with neurology who will assess. Plan for medicine admission, small fluid bolus ordered. Urinalysis pending.  No seizures in ED.   Discussed with neurologist in ED.  Discussed with hospitalist.   The patients results and plan were reviewed and discussed.   Any x-rays performed were independently reviewed by myself.   Differential diagnosis were considered with the presenting HPI.  Medications  0.9 %  sodium chloride infusion (not administered)  sodium chloride 0.9 % bolus 500 mL (0 mLs Intravenous Stopped 07/04/15 2028)    Filed Vitals:   07/04/15 2000 07/04/15 2015 07/04/15 2030 07/04/15 2045  BP: 143/90 151/92 152/88 155/84  Pulse: 97 96 96 96  Temp:      TempSrc:      Resp: 16 15 15 12   SpO2: 96% 98% 99% 99%    Final diagnoses:  Hyponatremia  Seizure-like activity  Confusion    Admission/ observation were discussed with the admitting physician, patient and/or family and they are comfortable with the plan.     Elnora Morrison, MD 07/04/15 [phone]

## 2015-07-04 NOTE — H&P (Addendum)
[facility] Admission History and Physical       Aimee Jones QBH:[id number] DOB: [birth year] DOA: 07/04/2015  Referring physician: EDP PCP: [name], MD  Specialists:   Chief Complaint: Seizure Activity  HPI: Aimee Jones is a 69 y.o. female with a history of CVA, HTN, Hyperlipidemia, and Dementia who is in the [facility] for rehab rx and was sent to the ED after witnessed seizure like activity at approximately 5 pm.   Her husband witessed the event and reports that she began to turn her head back and forth and started salivating, and then vomited and after that she began to gaze to her left and shake all over and lost consciousness.  The episode last 1-2 minutes and she was confused afterward.   EMS was called and she was brought to the ED and in the ED she was found to have a hyponatremia with a level of 122.  A head Ct scan was performed and was negative for acute findings.  Neurology was consulted and saw the patient in the ED.   Patient reports no previous Seizures or Seizure history.     Review of Systems:  Constitutional: No Weight Loss, No Weight Gain, Night Sweats, Fevers, Chills, Dizziness, Light Headedness, Fatigue, or Generalized Weakness HEENT: No Headaches, Difficulty Swallowing,Tooth/Dental Problems,Sore Throat,  No Sneezing, Rhinitis, Ear Ache, Nasal Congestion, or Post Nasal Drip,  Cardio-vascular:  No Chest pain, Orthopnea, PND, Edema in Lower Extremities, Anasarca, Dizziness, Palpitations  Resp: No Dyspnea, No DOE, No Productive Cough, No Non-Productive Cough, No Hemoptysis, No Wheezing.    GI: No Heartburn, Indigestion, Abdominal Pain, Nausea, Vomiting, Diarrhea, Constipation, Hematemesis, Hematochezia, Melena, Change in Bowel Habits,  Loss of Appetite  GU: No Dysuria, No Change in Color of Urine, No Urgency or Urinary Frequency, No Flank pain.  Musculoskeletal: No Joint Pain or Swelling, No Decreased Range of Motion, No Back Pain.    Neurologic: No Syncope,o Seizures, Muscle Weakness, Paresthesia, Vision Disturbance or Loss, No Diplopia, No Vertigo, No Difficulty Walking,  Skin: No Rash or Lesions. Psych: No Change in Mood or Affect, No Depression or Anxiety, No Memory loss, No Confusion, or Hallucinations   Past Medical History  Diagnosis Date  . Hyperlipidemia   . ETOH abuse   . Allergy   . Diverticulosis   . Hypertension   . Depression   . Asthma   . GERD (gastroesophageal reflux disease)   . Normal 24 hour ambulatory pH monitoring study     good acid suppression  . Dementia   . Shortness of breath   . Anxiety     panic attacks- on occas.   . Arthritis     osteoporosis - especially hips.   . Stroke   . Memory loss   . Headache   . Confusion      Past Surgical History  Procedure Laterality Date  . Dobutamine stress echo  07/09/2001    normal  . Esophagogastroduodenoscopy  06/11/2006    normal  . Abdominal hysterectomy    . Total hip arthroplasty      left 1991/right 2008  . Nasal sinus surgery  1990  . Eye surgery      cataracts removed, IOL- both eyes   . Breast surgery      3x- biposies   . Shoulder arthroscopy Left   . Reverse shoulder arthroplasty Right 08/13/2014    Procedure: REVERSE SHOULDER ARTHROPLASTY;  Surgeon: Marin Shutter, MD;  Location: [facility];  Service: Orthopedics;  Laterality: Right;  interscalene block  . Joint replacement      bilateral hips  . Loop recorder implant N/A 10/14/2014    Procedure: LOOP RECORDER IMPLANT;  Surgeon: Evans Lance, MD;  Location: [facility] CATH LAB;  Service: Cardiovascular;  Laterality: N/A;  . Tee without cardioversion N/A 10/14/2014    Procedure: TRANSESOPHAGEAL ECHOCARDIOGRAM (TEE);  Surgeon: Sueanne Margarita, MD;  Location: [facility] ENDOSCOPY;  Service: Cardiovascular;  Laterality: N/A;      Prior to Admission medications   Medication Sig Start Date End Date Taking? Authorizing Provider  acetaminophen (TYLENOL) 500 MG tablet Take 500 mg by mouth  every 6 (six) hours as needed for mild pain.   Yes Historical Provider, MD  ALPRAZolam Duanne Moron) 0.5 MG tablet TAKE 1 TABLET 3 TIMES A DAY AS NEEDED 05/16/15  Yes Tonia Ghent, MD  atorvastatin (LIPITOR) 10 MG tablet Take 10 mg by mouth daily at 6 PM.    Yes Historical Provider, MD  azelastine (ASTELIN) 0.1 % nasal spray Place into both nostrils 2 (two) times daily. Use in each nostril as directed   Yes Historical Provider, MD  b complex vitamins tablet Take 1 tablet by mouth daily.   Yes Historical Provider, MD  baclofen (LIORESAL) 10 MG tablet Take 10 mg by mouth 2 (two) times daily as needed for muscle spasms.  06/12/15  Yes Historical Provider, MD  barrier cream (NON-SPECIFIED) CREA Apply 1 application topically 4 (four) times daily as needed (for periarea).   Yes Historical Provider, MD  budesonide-formoterol (SYMBICORT) 160-4.5 MCG/ACT inhaler Inhale 2 puffs into the lungs 2 (two) times daily as needed (shortness of breath). 08/12/14  Yes Tonia Ghent, MD  folic acid (FOLVITE) 1 MG tablet Take 1 mg by mouth daily.   Yes Historical Provider, MD  guaiFENesin (MUCINEX) 600 MG 12 hr tablet Take 2 tablets (1,200 mg total) by mouth 2 (two) times daily. 10/14/14  Yes Maryann Mikhail, DO  montelukast (SINGULAIR) 10 MG tablet Take 10 mg by mouth at bedtime.  02/21/13  Yes Historical Provider, MD  Multiple Vitamin (MULTIVITAMIN WITH MINERALS) TABS tablet Take 1 tablet by mouth every evening.   Yes Historical Provider, MD  nystatin cream (MYCOSTATIN) Apply 1 application topically 2 (two) times daily.   Yes Historical Provider, MD  Omega 3 1000 MG CAPS Take 1,000 mg by mouth every evening.   Yes Historical Provider, MD  omeprazole (PRILOSEC) 20 MG capsule TAKE 1 CAPSULE TWICE DAILY FOR 10 DAYS, THEN DECREASE TO NORMAL ONCE DAILY DOSING 05/04/15  Yes Tonia Ghent, MD  promethazine (PHENERGAN) 25 MG tablet TAKE 1 TABLET BY MOUTH EVERY 6 HOURS AS NEEDED FOR NAUSEA 06/11/15  Yes Tonia Ghent, MD  thiamine  100 MG tablet Take 100 mg by mouth daily.   Yes Historical Provider, MD  traMADol (ULTRAM) 50 MG tablet Take 50 mg by mouth 3 (three) times daily.   Yes Historical Provider, MD  vitamin E 400 UNIT capsule Take 400 Units by mouth daily.   Yes Historical Provider, MD  Aspirin-Salicylamide-Caffeine (BC HEADACHE POWDER PO) Take 2 packets by mouth daily as needed (headache).     Historical Provider, MD  citalopram (CELEXA) 40 MG tablet Take 1 tablet (40 mg total) by mouth at bedtime. 04/07/15   Tonia Ghent, MD  clopidogrel (PLAVIX) 75 MG tablet Take 1 tablet (75 mg total) by mouth daily. 12/11/14   Rosalin Hawking, MD  donepezil (ARICEPT) 10 MG tablet TAKE 1 TABLET BY MOUTH  DAILY AT BEDTIME 12/11/14   Tonia Ghent, MD  doxepin [facility]) 25 MG capsule TAKE 1 CAPSULE BY MOUTH DAILY AT BEDTIME 05/10/15   Tonia Ghent, MD  fentaNYL (DURAGESIC - DOSED MCG/HR) 25 MCG/HR patch Place 25 mcg onto the skin every 3 (three) days.  03/16/15   Historical Provider, MD  fish oil-omega-3 fatty acids 1000 MG capsule Take 1 g by mouth daily.     Historical Provider, MD  fluticasone (FLONASE) 50 MCG/ACT nasal spray Place 1-2 sprays into both nostrils daily. Patient taking differently: Place 1-2 sprays into both nostrils daily as needed for allergies.  03/16/15   Tonia Ghent, MD  Multiple Vitamins-Minerals (MULTIVITAMIN PO) Take 1 tablet by mouth daily.    Historical Provider, MD  nabumetone (RELAFEN) 750 MG tablet TAKE 1 TABLET BY MOUTH 2 TIMES DAILY AS NEEDED FOR PAIN. 05/26/15   Tonia Ghent, MD  ondansetron (ZOFRAN) 4 MG tablet Take 4 mg by mouth every morning.    Historical Provider, MD  oxyCODONE (ROXICODONE) 15 MG immediate release tablet Take 15 mg by mouth every 3 (three) hours. 06/09/15   Historical Provider, MD  VITAMIN E PO Take 1 capsule by mouth daily.     Historical Provider, MD     Allergies  Allergen Reactions  . Latex Rash  . Penicillins Rash    Social History:  reports that she has never  smoked. She has never used smokeless tobacco. She reports that she drinks alcohol. She reports that she does not use illicit drugs.    Family History  Problem Relation Age of Onset  . Sudden death Brother   . Heart attack Brother   . Hypertension Brother   . Esophageal cancer Brother   . Heart attack Father   . Heart block Mother   . Stroke Brother   . Stroke Mother        Physical Exam:  GEN:  Pleasant Elderly Obese 69 y.o. Caucasian female examined and in no acute distress; cooperative with exam Filed Vitals:   07/04/15 2000 07/04/15 2015 07/04/15 2030 07/04/15 2045  BP: 143/90 151/92 152/88 155/84  Pulse: 97 96 96 96  Temp:      TempSrc:      Resp: 16 15 15 12   SpO2: 96% 98% 99% 99%   Blood pressure 155/84, pulse 96, temperature 99.2 F (37.3 C), temperature source Oral, resp. rate 12, SpO2 99 %. PSYCH: She is alert and oriented x4; does not appear anxious does not appear depressed; affect is normal HEENT: Normocephalic  +Left Periorbital Hematoma and ecchymosis of the Left lateral face Area, Mucous membranes pink; PERRLA; EOM intact; Fundi:  Benign;  No scleral icterus, Nares: Patent, Oropharynx: Clear, Fair Dentition,    Neck:  FROM, No Cervical Lymphadenopathy nor Thyromegaly or Carotid Bruit; No JVD; Breasts:: Not examined CHEST WALL: No tenderness CHEST: Normal respiration, clear to auscultation bilaterally HEART: Regular rate and rhythm; no murmurs rubs or gallops BACK: No kyphosis or scoliosis; No CVA tenderness ABDOMEN: Positive Bowel Sounds, Obese, Soft Non-Tender, No Rebound or Guarding; No Masses, No Organomegaly, No Pannus; No Intertriginous candida. Rectal Exam: Not done EXTREMITIES: No Cyanosis, Clubbing, or Edema; No Ulcerations. Genitalia: not examined PULSES: 2+ and symmetric SKIN: Normal hydration no rash or ulceration CNS:  Alert and Oriented x 4, No Focal Deficits Vascular: pulses palpable throughout    Labs on Admission:  Basic Metabolic  Panel:  Recent Labs Lab 06/28/15 0253 07/04/15 1845  NA 132* 122*  K 3.7 3.4*  CL 100* 91*  CO2 25 20*  GLUCOSE 93 115*  BUN <5* 6  CREATININE 0.93 0.96  CALCIUM 9.1 8.7*  MG  --  1.7   Liver Function Tests:  Recent Labs Lab 06/28/15 0253 07/04/15 1845  AST 24 30  ALT 24 24  ALKPHOS 82 82  BILITOT 0.6 0.4  PROT 5.8* 6.1*  ALBUMIN 3.0* 3.3*   No results for input(s): LIPASE, AMYLASE in the last 168 hours. No results for input(s): AMMONIA in the last 168 hours. CBC:  Recent Labs Lab 06/28/15 0253 07/04/15 1845  WBC 11.0* 13.6*  NEUTROABS 7.6 11.2*  HGB 9.4* 10.0*  HCT 29.0* 31.1*  MCV 74.7* 76.2*  PLT 469* 672*   Cardiac Enzymes: No results for input(s): CKTOTAL, CKMB, CKMBINDEX, TROPONINI in the last 168 hours.  BNP (last 3 results) No results for input(s): BNP in the last 8760 hours.  ProBNP (last 3 results) No results for input(s): PROBNP in the last 8760 hours.  CBG:  Recent Labs Lab 07/04/15 1859  GLUCAP 122*    Radiological Exams on Admission: Ct Head Wo Contrast  07/04/2015   CLINICAL DATA:  Fall June 22, 2015 with left-sided bruising. Increased confusion and pain since the accident. Subsequent encounter.  EXAM: CT HEAD WITHOUT CONTRAST  TECHNIQUE: Contiguous axial images were obtained from the base of the skull through the vertex without intravenous contrast.  COMPARISON:  06/22/2015  FINDINGS: Persistent hematoma in the left forehead. No new site of swelling. No acute fracture. No sinus or mastoid fluid (where seen).  Orbits: Bilateral cataract resection.  No traumatic finding.  Brain: No evidence of acute infarction, hemorrhage, hydrocephalus, or mass lesion/mass effect.  Moderate chronic small vessel disease with patchy ischemic gliosis in the cerebral white matter, confluent around the lateral ventricles.  Cerebral volume loss, typical for age.  IMPRESSION: No acute finding or change from prior. No evidence of intracranial injury.    Electronically Signed   By: Monte Fantasia M.D.   On: 07/04/2015 21:33     EKG: Independently reviewed. Sinus Tachycardia rate =103 Previous Antero-Septal Infarct Changes   Assessment/Plan:   69 y.o. female with  Principal Problem:   1.    Seizure-like activity/Seizure   Seizure Precautions   Cardiac Monitoring   PRN IV Ativan   MRI Brain in AM   EEG in AM   Discontinue Tramadol,    Correct Hyponatremia   Seen by Neurology    Active Problems:   2.    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    UTI (lower urinary tract infection)   Urine C+S sent   Empiric IV Rocephin     4.    Essential hypertension   IV Hydralazine PRN     5.    CKD (chronic kidney disease), stage III   Monitor BUN/Cr   IVFs     6.    [facility]   Fall Precautions     7.    DVT Prophylaxis   Lovenox    Code Status:     FULL CODE        Family Communication:   Husband at Bedside     Disposition Plan:    Inpatient Status  Time spent:  [place] [facility] Pager [phone]   If [place] Please Contact the Day Rounding Team MD for [facility]  If 7PM-7AM, Please Contact Night-Floor Coverage  [email or web address] Password [email or web address] 07/04/2015, 10:04 PM     ADDENDUM:   Patient was seen and examined on 07/04/2015

## 2015-07-04 NOTE — ED Notes (Signed)
Dr. Leonel Ramsay MD at bedside.

## 2015-07-05 ENCOUNTER — Encounter (HOSPITAL_COMMUNITY): Payer: Self-pay | Admitting: General Practice

## 2015-07-05 ENCOUNTER — Inpatient Hospital Stay (HOSPITAL_COMMUNITY): Payer: Medicare Other

## 2015-07-05 DIAGNOSIS — R569 Unspecified convulsions: Secondary | ICD-10-CM

## 2015-07-05 DIAGNOSIS — E871 Hypo-osmolality and hyponatremia: Secondary | ICD-10-CM

## 2015-07-05 DIAGNOSIS — D72829 Elevated white blood cell count, unspecified: Secondary | ICD-10-CM

## 2015-07-05 HISTORY — DX: Hypo-osmolality and hyponatremia: E87.1

## 2015-07-05 HISTORY — DX: Unspecified convulsions: R56.9

## 2015-07-05 LAB — BASIC METABOLIC PANEL WITH GFR
Anion gap: 8 (ref 5–15)
BUN: <5 mg/dL — ABNORMAL LOW (ref 6–20)
CO2: 22 mmol/L (ref 22–32)
Calcium: 9.1 mg/dL (ref 8.9–10.3)
Chloride: 107 mmol/L (ref 101–111)
Creatinine, Ser: 0.75 mg/dL (ref 0.44–1.00)
GFR calc Af Amer: >60 mL/min
GFR calc non Af Amer: >60 mL/min
Glucose, Bld: 112 mg/dL — ABNORMAL HIGH (ref 65–99)
Potassium: 3.7 mmol/L (ref 3.5–5.1)
Sodium: 137 mmol/L (ref 135–145)

## 2015-07-05 LAB — TYPE AND SCREEN
ABO/RH(D): O POS
Antibody Screen: NEGATIVE

## 2015-07-05 LAB — CBC
HCT: 19.2 % — ABNORMAL LOW (ref 36.0–46.0)
HCT: 30.6 % — ABNORMAL LOW (ref 36.0–46.0)
Hemoglobin: 6.2 g/dL — CL (ref 12.0–15.0)
Hemoglobin: 9.9 g/dL — ABNORMAL LOW (ref 12.0–15.0)
MCH: 24.8 pg — ABNORMAL LOW (ref 26.0–34.0)
MCH: 25 pg — AB (ref 26.0–34.0)
MCHC: 32.3 g/dL (ref 30.0–36.0)
MCHC: 32.4 g/dL (ref 30.0–36.0)
MCV: 77.4 fL — ABNORMAL LOW (ref 78.0–100.0)
MCV: 76.5 fL — ABNORMAL LOW (ref 78.0–100.0)
PLATELETS: 429 10*3/uL — AB (ref 150–400)
Platelets: 704 10*3/uL — ABNORMAL HIGH (ref 150–400)
RBC: 2.48 MIL/uL — ABNORMAL LOW (ref 3.87–5.11)
RBC: 4 MIL/uL (ref 3.87–5.11)
RDW: 21.3 % — AB (ref 11.5–15.5)
RDW: 21.4 % — ABNORMAL HIGH (ref 11.5–15.5)
WBC: 10 10*3/uL (ref 4.0–10.5)
WBC: 15.4 10*3/uL — ABNORMAL HIGH (ref 4.0–10.5)

## 2015-07-05 LAB — C DIFFICILE QUICK SCREEN W PCR REFLEX
C Diff antigen: NEGATIVE
C Diff interpretation: NEGATIVE
C Diff toxin: NEGATIVE

## 2015-07-05 LAB — TSH: TSH: 0.345 u[IU]/mL — ABNORMAL LOW (ref 0.350–4.500)

## 2015-07-05 LAB — OSMOLALITY, URINE: OSMOLALITY UR: 304 mosm/kg — AB (ref 390–1090)

## 2015-07-05 LAB — ABO/RH: ABO/RH(D): O POS

## 2015-07-05 MED ORDER — SODIUM CHLORIDE 0.9 % IV SOLN: Freq: Once | INTRAVENOUS | Status: DC

## 2015-07-05 MED ORDER — DEXTROSE 5 % IV SOLN
1.0000 g | INTRAVENOUS | Status: DC
  Administered 2015-07-05: 1 g via INTRAVENOUS
  Filled 2015-07-05: qty 10

## 2015-07-05 MED ORDER — PANTOPRAZOLE SODIUM 40 MG IV SOLR: 40.0000 mg | Freq: Two times a day (BID) | INTRAVENOUS | Status: DC

## 2015-07-05 MED ORDER — GADOBENATE DIMEGLUMINE 529 MG/ML IV SOLN
15.0000 mL | Freq: Once | INTRAVENOUS | Status: AC | PRN
Start: 2015-07-05 — End: 2015-07-05
  Administered 2015-07-05: 15 mL via INTRAVENOUS

## 2015-07-05 NOTE — Clinical Social Work Note (Signed)
Clinical Social Worker received referral from RN that patient was admitted from [facility].  CSW spoke with patient and patient husband at bedside regarding potential return, however patient and husband have opted to return home with home health.  Patient husband expressed his wishes for [facility] and does not feel that there will be equipment needs.  CSW provided CM with patient and patient husband home health wishes.  Patient husband is familiar with private duty services and will contact if needed at a later time.   Clinical Social Worker will sign off for now as social work intervention is no longer needed. Please consult [place] again if new need arises.  Barbette Or, [place]

## 2015-07-05 NOTE — ED Notes (Signed)
Dr. Reynolds at bedside.

## 2015-07-05 NOTE — Progress Notes (Addendum)
Pt admitted to the unit at 0835. Pt mental status is A&Ox4. Pt oriented to room, staff, and call bell. Pt has left sided bruising from previous fall (treated last admission) on the left face, arm and leg. Full assessment charted in [facility]. Call bell within reach. Visitor guidelines reviewed w/ pt and/or family.

## 2015-07-05 NOTE — ED Notes (Addendum)
Critical Lab value:  6.2 hemoglobin  Dr. Arnoldo Morale, MD, made aware.

## 2015-07-05 NOTE — Progress Notes (Signed)
Pt was attempting to leave and pull out IV she states that she was going to the beach.  She began cursing and requesting that she be left alone. Telephone call made to husband and informed him of the situation and asked if he or family could come and spend the night with her. He stated that he wasn't able to and that daughter was already asleep he stated if restraints are need that we had his permission. I informed him that would be the last resort but if needed to keep her safe a call call to MD would be necessary. He thanked me for phone call and request updates if needed. Pt did speak with her husband and she did calm down. Arthor Captain LPN

## 2015-07-05 NOTE — Care Management Note (Addendum)
Case Management Note  Patient Details  Name: Aimee Jones MRN: [id number] Date of Birth: 04-15-1946  Subjective/Objective:                  Date: 07-05-15 Monday Spoke with patient at the bedside along with daughter Aimee Jones- who is an Therapist, sports- [phone]. Introduced self as Tourist information centre manager and explained role in discharge planning and how to be reached. Verified patient lives in [place] with spouse, patient admitted from [place] but family does not wish to return, has DME walker BSC, tub bench, lift chair, wheelchair. Expressed potential need for no other DME. Verified patient anticipates to go home with family, at time of discharge and will have full-time supervision by family, Comfort [facility] Barefoot (being arranged by daughter Aimee Jones) friends neighbors at this time to best of their knowledge. Patient denied needing help with their medication. Patient is driven by or takes the bus to MD appointments. Verified patient has PCP Dr. [name]. Daughter express that father has had problems with ETOH, and has stopped drinking for 6 days and is being followed by his PCP that she took him to this week for ETOH withdrawl. States father was mildly symptomatic this weekend but doing much better now. Daughter feels very comfortable with plan for home discharge with [facility] and private duty care.  Patient was provided choice and selected [facility] for home health needs.   Plan: CM will continue to follow for discharge planning and [facility] resources.   Carles Collet RN BSN CM [phone]   Action/Plan:  07-07-15 Patient to discharge to home with family. Referral made to [facility] for [facility] RN HHA SW PT OT.   Expected Discharge Date:                  Expected Discharge Plan:  [place]  In-House Referral:  Clinical Social Work  Discharge planning Services  CM Consult  Post Acute Care Choice:  Home Health Choice offered to:  Patient, Spouse, Adult Children  DME Arranged:    DME Agency:     HH Arranged:     [place] Agency:  [name]  Status of Service:  In process, will continue to follow  Medicare Important Message Given:    Date Medicare IM Given:    Medicare IM give by:    Date Additional Medicare IM Given:    Additional Medicare Important Message give by:     If discussed at [place] of Stay Meetings, dates discussed:    Additional Comments:  Carles Collet, RN 07/05/2015, 3:35 PM

## 2015-07-05 NOTE — ED Notes (Signed)
Attempted to call report.  Inpatient RN unavailable and will return call.

## 2015-07-05 NOTE — Progress Notes (Signed)
Subjective: Patient awake and alert.  No further seizures.    Objective: Current vital signs: BP 141/81 mmHg  Pulse 93  Temp(Src) 98.4 F (36.9 C) (Oral)  Resp 20  Ht 5\' 7"  (1.702 m)  Wt 81.3 kg (179 lb 3.7 oz)  BMI 28.07 kg/m2  SpO2 99% Vital signs in last 24 hours: Temp:  [98.4 F (36.9 C)-99.2 F (37.3 C)] 98.4 F (36.9 C) (09/19 0843) Pulse Rate:  [83-104] 93 (09/19 0843) Resp:  [11-23] 20 (09/19 0843) BP: (135-162)/(74-93) 141/81 mmHg (09/19 0843) SpO2:  [95 %-99 %] 99 % (09/19 0843) Weight:  [81.3 kg (179 lb 3.7 oz)] 81.3 kg (179 lb 3.7 oz) (09/19 0843)  Intake/Output from previous day: 09/18 0701 - 09/19 0700 In: -  Out: 0973 [Urine:1390] Intake/Output this shift: Total I/O In: 300 [P.O.:300] Out: 300 [Urine:300] Nutritional status: Diet Heart Room service appropriate?: Yes; Fluid consistency:: Thin  Neurologic Exam: Mental Status: Patient is awake, alert.  Follows commands.  Speech fluent.  Perseverates.   Cranial Nerves: II: Visual Fields are full. Pupils are equal, round, and reactive to light.  III,IV, VI: EOMI without ptosis or diploplia.  V: Facial sensation is symmetric to temperature VII: Facial movement is symmetric.  VIII: hearing is intact to voice X: Uvula elevates symmetrically XI: Shoulder shrug is symmetric. XII: tongue is midline without atrophy or fasciculations.  Motor: Tone is normal. Bulk is normal. 5/5 strength was present in all four extremities.  Sensory: Sensation is symmetric to light touch and temperature in the arms and legs.    Lab Results: Basic Metabolic Panel:  Recent Labs Lab 07/04/15 1845 07/05/15 0827  NA 122* 137  K 3.4* 3.7  CL 91* 107  CO2 20* 22  GLUCOSE 115* 112*  BUN 6 <5*  CREATININE 0.96 0.75  CALCIUM 8.7* 9.1  MG 1.7  --     Liver Function Tests:  Recent Labs Lab 07/04/15 1845  AST 30  ALT 24  ALKPHOS 82  BILITOT 0.4  PROT 6.1*  ALBUMIN 3.3*   No results for input(s): LIPASE,  AMYLASE in the last 168 hours. No results for input(s): AMMONIA in the last 168 hours.  CBC:  Recent Labs Lab 07/04/15 1845 07/05/15 0237 07/05/15 0327  WBC 13.6* 10.0 15.4*  NEUTROABS 11.2*  --   --   HGB 10.0* 6.2* 9.9*  HCT 31.1* 19.2* 30.6*  MCV 76.2* 77.4* 76.5*  PLT 672* 429* 704*    Cardiac Enzymes: No results for input(s): CKTOTAL, CKMB, CKMBINDEX, TROPONINI in the last 168 hours.  Lipid Panel: No results for input(s): CHOL, TRIG, HDL, CHOLHDL, VLDL, LDLCALC in the last 168 hours.  CBG:  Recent Labs Lab 07/04/15 1859  GLUCAP 5*    Microbiology: Results for orders placed or performed during the hospital encounter of 06/22/15  MRSA PCR Screening     Status: None   Collection Time: 06/22/15  8:48 PM  Result Value Ref Range Status   MRSA by PCR NEGATIVE NEGATIVE Final    Comment:        The GeneXpert MRSA Assay (FDA approved for NASAL specimens only), is one component of a comprehensive MRSA colonization surveillance program. It is not intended to diagnose MRSA infection nor to guide or monitor treatment for MRSA infections.   Culture, Urine     Status: None   Collection Time: 06/23/15  5:00 PM  Result Value Ref Range Status   Specimen Description URINE, CATHETERIZED  Final   Special Requests NONE  Final   Culture 20,000 COLONIES/mL PROTEUS MIRABILIS  Final   Report Status 06/25/2015 FINAL  Final   Organism ID, Bacteria PROTEUS MIRABILIS  Final      Susceptibility   Proteus mirabilis - MIC*    AMPICILLIN <=2 SENSITIVE Sensitive     CEFAZOLIN <=4 SENSITIVE Sensitive     CEFTRIAXONE <=1 SENSITIVE Sensitive     CIPROFLOXACIN <=0.25 SENSITIVE Sensitive     GENTAMICIN <=1 SENSITIVE Sensitive     IMIPENEM 4 SENSITIVE Sensitive     NITROFURANTOIN 128 RESISTANT Resistant     TRIMETH/SULFA <=20 SENSITIVE Sensitive     AMPICILLIN/SULBACTAM <=2 SENSITIVE Sensitive     PIP/TAZO <=4 SENSITIVE Sensitive     * 20,000 COLONIES/mL PROTEUS MIRABILIS     Coagulation Studies: No results for input(s): LABPROT, INR in the last 72 hours.  Imaging: Ct Head Wo Contrast  07/04/2015   CLINICAL DATA:  Fall June 22, 2015 with left-sided bruising. Increased confusion and pain since the accident. Subsequent encounter.  EXAM: CT HEAD WITHOUT CONTRAST  TECHNIQUE: Contiguous axial images were obtained from the base of the skull through the vertex without intravenous contrast.  COMPARISON:  06/22/2015  FINDINGS: Persistent hematoma in the left forehead. No new site of swelling. No acute fracture. No sinus or mastoid fluid (where seen).  Orbits: Bilateral cataract resection.  No traumatic finding.  Brain: No evidence of acute infarction, hemorrhage, hydrocephalus, or mass lesion/mass effect.  Moderate chronic small vessel disease with patchy ischemic gliosis in the cerebral white matter, confluent around the lateral ventricles.  Cerebral volume loss, typical for age.  IMPRESSION: No acute finding or change from prior. No evidence of intracranial injury.   Electronically Signed   By: Monte Fantasia M.D.   On: 07/04/2015 21:33   Mr Jeri Cos BJ Contrast  07/05/2015   CLINICAL DATA:  Seizure this afternoon. Confusion. History of dementia, alcohol abuse, stroke, headache, hyperlipidemia, hypertension.  EXAM: MRI HEAD WITHOUT AND WITH CONTRAST  TECHNIQUE: Multiplanar, multiecho pulse sequences of the brain and surrounding structures were obtained without and with intravenous contrast.  CONTRAST:  49mL MULTIHANCE GADOBENATE DIMEGLUMINE 529 MG/ML IV SOLN  COMPARISON:  CT head July 04, 2015 and MRI of the brain October 23, 2014  FINDINGS: No reduced diffusion to suggest acute ischemia. No susceptibility artifact to suggest hemorrhage. Moderate ventriculomegaly, proportional enlargement of cerebral sulci and cerebellar folia, similar to prior imaging. Old small RIGHT cerebellar infarcts. Patchy to confluent supratentorial white matter T2 hyperintense signal without  midline shift, mass effect or mass lesions. Symmetric appearance of the hippocampi with normal signal, limited assessment for morphology due to mild patient motion.  No abnormal parenchymal or leptomeningeal enhancement. No abnormal extra-axial fluid collections. Normal major intracranial vascular flow voids observed at the skull base. Status post bilateral ocular lens implants. Paranasal sinuses and mastoid air cells are well aerated. No abnormal sellar expansion. No cerebellar tonsillar ectopia. No suspicious calvarial bone marrow signal. Large RIGHT frontal scalp hematoma.  IMPRESSION: No acute intracranial process.  Stable appearance of the brain: Moderate global brain parenchymal volume loss, moderate chronic small vessel ischemic disease.   Electronically Signed   By: Elon Alas M.D.   On: 07/05/2015 01:28    Medications:  Scheduled: . sodium chloride   Intravenous Once  . atorvastatin  10 mg Oral q1800  . B-complex with vitamin C  1 tablet Oral Daily  . clopidogrel  75 mg Oral Daily  . donepezil  10 mg Oral QHS  .  doxepin  25 mg Oral QHS  . enoxaparin (LOVENOX) injection  40 mg Subcutaneous Daily  . montelukast  10 mg Oral QHS  . multivitamin with minerals  1 tablet Oral QPM  . omega-3 acid ethyl esters  1,000 mg Oral QPM  . pantoprazole  40 mg Oral Daily  . sodium chloride  3 mL Intravenous Q12H  . thiamine  100 mg Oral Daily    Assessment/Plan: Patient with new onset seizures.  Likely multifactorial.  MRI of the brain personally reviewed and shows no acute changes.  UTI and sodium being addressed.    Recommendations: 1.  EEG pending 2.  Agree with treatment of current medical issues.     LOS: 1 day   Alexis Goodell, MD [facility] [phone] 07/05/2015  1:09 PM

## 2015-07-05 NOTE — Progress Notes (Signed)
PROGRESS NOTE  Aimee Jones:[id number] DOB: 1946/03/24 DOA: 07/04/2015 PCP: [name], MD  HPI/Recap of past 15 hours: 69 year old female with past medical history of hypertension, previous stroke and alcohol related dementia currently at skilled nursing for rehabilitation sent over to the emergency room on 9/18 after witnessed seizure-like activity which included an episode of vomiting and then patient lost consciousness. Episode reportedly lasted about 2 minutes. In the emergency room, patient noted to have a sodium level of 122,  and a normal head CT. Neurology evaluated patient and found a number of contributing factors although not a significant outstanding one. With IV fluids, sodium up to 137 by the following day.  Patient today  Doing okay. She feels fine. White count went to 15. Also having loose stools. C. Difficile, chest x-ray and urinalysis unremarkable  Assessment/Plan: Principal Problem:   Leukocytosis: No fever. Concerned about the possibility of aspiration pneumonia after episode of vomiting. Check chest x-ray Active Problems:   Essential hypertension: Continue home medications   Hyponatremia: Secondary dehydration. Not very impressive as far as cause for seizure. Patient does have chronic issues with this and this may be from medication. Have stopped her doxepin    CKD (chronic kidney disease), stage III: Reported in history, although GFR high and patient with normal renal function currently.    Dementia associated with alcoholism: Stable for now.   Periorbital hematoma of left eye   Seizure-like activity: Seen by neurology. May be multifactorial secondary UTI, starting of tramadol, stopping his Xanax and slightly low sodium.  MRI unrevealing, so at this time as per neurology recommendations, no plans for antiepileptics drug    Code Status:  Full code  Family Communication:  Daughter and sister at the bedside  Disposition Plan: Back to skilled nursing once  leukocytosis treated , likely tomorrow   Consultants:  Neurology  Procedures:  None  Antibiotics:  IV Rocephin 9/19 1 dose   Objective: BP 141/81 mmHg  Pulse 93  Temp(Src) 98.4 F (36.9 C) (Oral)  Resp 20  Ht 5\' 7"  (1.702 m)  Wt 81.3 kg (179 lb 3.7 oz)  BMI 28.07 kg/m2  SpO2 99%  Intake/Output Summary (Last 24 hours) at 07/05/15 1030 Last data filed at 07/05/15 0943  Gross per 24 hour  Intake    300 ml  Output   1690 ml  Net  -1390 ml   Filed Weights   07/05/15 0843  Weight: 81.3 kg (179 lb 3.7 oz)    Exam:   General:   Alert and oriented 2 , no acute distress , bruising on left side of face  Cardiovascular:  Regular rate and rhythm, Y6-V7, 2/6 systolic ejection murmur  Respiratory:  Clear to auscultation bilaterally  Abdomen:  Soft, nontender, nondistended, positive bowel sounds  Musculoskeletal:  Trace pitting edema   Data Reviewed: Basic Metabolic Panel:  Recent Labs Lab 07/04/15 1845 07/05/15 0827  NA 122* 137  K 3.4* 3.7  CL 91* 107  CO2 20* 22  GLUCOSE 115* 112*  BUN 6 <5*  CREATININE 0.96 0.75  CALCIUM 8.7* 9.1  MG 1.7  --    Liver Function Tests:  Recent Labs Lab 07/04/15 1845  AST 30  ALT 24  ALKPHOS 82  BILITOT 0.4  PROT 6.1*  ALBUMIN 3.3*   No results for input(s): LIPASE, AMYLASE in the last 168 hours. No results for input(s): AMMONIA in the last 168 hours. CBC:  Recent Labs Lab 07/04/15 1845 07/05/15 0237 07/05/15 0327  WBC 13.6* 10.0 15.4*  NEUTROABS 11.2*  --   --   HGB 10.0* 6.2* 9.9*  HCT 31.1* 19.2* 30.6*  MCV 76.2* 77.4* 76.5*  PLT 672* 429* 704*   Cardiac Enzymes:   No results for input(s): CKTOTAL, CKMB, CKMBINDEX, TROPONINI in the last 168 hours. BNP (last 3 results) No results for input(s): BNP in the last 8760 hours.  ProBNP (last 3 results) No results for input(s): PROBNP in the last 8760 hours.  CBG:  Recent Labs Lab 07/04/15 1859  GLUCAP 122*    No results found for this or  any previous visit (from the past 240 hour(s)).   Studies: Ct Head Wo Contrast  07/04/2015   CLINICAL DATA:  Fall June 22, 2015 with left-sided bruising. Increased confusion and pain since the accident. Subsequent encounter.  EXAM: CT HEAD WITHOUT CONTRAST  TECHNIQUE: Contiguous axial images were obtained from the base of the skull through the vertex without intravenous contrast.  COMPARISON:  06/22/2015  FINDINGS: Persistent hematoma in the left forehead. No new site of swelling. No acute fracture. No sinus or mastoid fluid (where seen).  Orbits: Bilateral cataract resection.  No traumatic finding.  Brain: No evidence of acute infarction, hemorrhage, hydrocephalus, or mass lesion/mass effect.  Moderate chronic small vessel disease with patchy ischemic gliosis in the cerebral white matter, confluent around the lateral ventricles.  Cerebral volume loss, typical for age.  IMPRESSION: No acute finding or change from prior. No evidence of intracranial injury.   Electronically Signed   By: Monte Fantasia M.D.   On: 07/04/2015 21:33   Mr Jeri Cos WE Contrast  07/05/2015   CLINICAL DATA:  Seizure this afternoon. Confusion. History of dementia, alcohol abuse, stroke, headache, hyperlipidemia, hypertension.  EXAM: MRI HEAD WITHOUT AND WITH CONTRAST  TECHNIQUE: Multiplanar, multiecho pulse sequences of the brain and surrounding structures were obtained without and with intravenous contrast.  CONTRAST:  3mL MULTIHANCE GADOBENATE DIMEGLUMINE 529 MG/ML IV SOLN  COMPARISON:  CT head July 04, 2015 and MRI of the brain October 23, 2014  FINDINGS: No reduced diffusion to suggest acute ischemia. No susceptibility artifact to suggest hemorrhage. Moderate ventriculomegaly, proportional enlargement of cerebral sulci and cerebellar folia, similar to prior imaging. Old small RIGHT cerebellar infarcts. Patchy to confluent supratentorial white matter T2 hyperintense signal without midline shift, mass effect or mass lesions.  Symmetric appearance of the hippocampi with normal signal, limited assessment for morphology due to mild patient motion.  No abnormal parenchymal or leptomeningeal enhancement. No abnormal extra-axial fluid collections. Normal major intracranial vascular flow voids observed at the skull base. Status post bilateral ocular lens implants. Paranasal sinuses and mastoid air cells are well aerated. No abnormal sellar expansion. No cerebellar tonsillar ectopia. No suspicious calvarial bone marrow signal. Large RIGHT frontal scalp hematoma.  IMPRESSION: No acute intracranial process.  Stable appearance of the brain: Moderate global brain parenchymal volume loss, moderate chronic small vessel ischemic disease.   Electronically Signed   By: Elon Alas M.D.   On: 07/05/2015 01:28    Scheduled Meds: . sodium chloride   Intravenous Once  . atorvastatin  10 mg Oral q1800  . B-complex with vitamin C  1 tablet Oral Daily  . cefTRIAXone (ROCEPHIN)  IV  1 g Intravenous Q24H  . clopidogrel  75 mg Oral Daily  . donepezil  10 mg Oral QHS  . doxepin  25 mg Oral QHS  . enoxaparin (LOVENOX) injection  40 mg Subcutaneous Daily  . montelukast  10 mg  Oral QHS  . multivitamin with minerals  1 tablet Oral QPM  . omega-3 acid ethyl esters  1,000 mg Oral QPM  . pantoprazole  40 mg Oral Daily  . sodium chloride  3 mL Intravenous Q12H  . thiamine  100 mg Oral Daily    Continuous Infusions: . sodium chloride 100 mL/hr at 07/05/15 9969     Time spent: 25 minutes  [place] [facility] Pager [phone]. If 7PM-7AM, please contact night-coverage at [email or web address], password [facility] 07/05/2015, 10:30 AM  LOS: 1 day

## 2015-07-05 NOTE — Progress Notes (Signed)
MD notified due to acute change in patient status. Vital signs and labs are listed below.  MD notified(1st page) Time of 1st page:  1515 Responding MD:  Lyman Speller Time MD responded: 63 MD response: MD stated aware and does not agree. Not further action necessary.  Vital Signs Filed Vitals:   07/05/15 0500 07/05/15 0800 07/05/15 0843 07/05/15 1449  BP:  154/78 141/81 151/82  Pulse: 86  93 113  Temp:   98.4 F (36.9 C) 98.6 F (37 C)  TempSrc:   Oral Oral  Resp: 14 18 20 20   Height:   5\' 7"  (1.702 m)   Weight:   81.3 kg (179 lb 3.7 oz)   SpO2: 97%  99% 99%     Lab Results WBC  Date/Time Value Ref Range Status  07/05/2015 03:27 AM 15.4* 4.0 - 10.5 K/uL Final  07/05/2015 02:37 AM 10.0 4.0 - 10.5 K/uL Final  07/04/2015 06:45 PM 13.6* 4.0 - 10.5 K/uL Final   NEUTROPHILS RELATIVE %  Date/Time Value Ref Range Status  07/04/2015 06:45 PM 83 % Final  06/28/2015 02:53 AM 68 43 - 77 % Final  06/27/2015 04:30 AM 70 43 - 77 % Final   No results found for: PCO2ART LACTIC ACID, VENOUS  Date/Time Value Ref Range Status  06/25/2015 04:30 AM 0.6 0.5 - 2.0 mmol/L Final   No results found for: PCO2VEN   Ward Givens, RN 07/05/2015, 3:15 PM

## 2015-07-06 ENCOUNTER — Inpatient Hospital Stay (HOSPITAL_COMMUNITY): Payer: Medicare Other

## 2015-07-06 DIAGNOSIS — B379 Candidiasis, unspecified: Secondary | ICD-10-CM

## 2015-07-06 LAB — CBC
HEMATOCRIT: 32.6 % — AB (ref 36.0–46.0)
Hemoglobin: 10.6 g/dL — ABNORMAL LOW (ref 12.0–15.0)
MCH: 25.5 pg — ABNORMAL LOW (ref 26.0–34.0)
MCHC: 32.5 g/dL (ref 30.0–36.0)
MCV: 78.6 fL (ref 78.0–100.0)
PLATELETS: 731 10*3/uL — AB (ref 150–400)
RBC: 4.15 MIL/uL (ref 3.87–5.11)
RDW: 22.1 % — AB (ref 11.5–15.5)
WBC: 16.4 10*3/uL — AB (ref 4.0–10.5)

## 2015-07-06 LAB — BASIC METABOLIC PANEL
Anion gap: 8 (ref 5–15)
CALCIUM: 9 mg/dL (ref 8.9–10.3)
CO2: 23 mmol/L (ref 22–32)
CREATININE: 0.83 mg/dL (ref 0.44–1.00)
Chloride: 104 mmol/L (ref 101–111)
GFR calc Af Amer: >60 mL/min (ref >60)
GLUCOSE: 100 mg/dL — AB (ref 65–99)
Potassium: 3.3 mmol/L — ABNORMAL LOW (ref 3.5–5.1)
SODIUM: 135 mmol/L (ref 135–145)

## 2015-07-06 LAB — BRAIN NATRIURETIC PEPTIDE: B NATRIURETIC PEPTIDE 5: 247.2 pg/mL — AB (ref 0.0–100.0)

## 2015-07-06 MED ORDER — ZOLPIDEM TARTRATE 5 MG PO TABS
5.0000 mg | ORAL_TABLET | Freq: Every day | ORAL | Status: DC
  Administered 2015-07-06: 5 mg via ORAL
  Filled 2015-07-06: qty 1

## 2015-07-06 MED ORDER — CLOTRIMAZOLE 1 % EX CREA
TOPICAL_CREAM | Freq: Two times a day (BID) | CUTANEOUS | Status: DC
  Administered 2015-07-07: 10:00:00 via TOPICAL
  Filled 2015-07-06: qty 15

## 2015-07-06 MED ORDER — FLUCONAZOLE 100 MG PO TABS
200.0000 mg | ORAL_TABLET | Freq: Once | ORAL | Status: AC
Start: 2015-07-06 — End: 2015-07-06
  Administered 2015-07-06: 200 mg via ORAL
  Filled 2015-07-06: qty 2

## 2015-07-06 NOTE — Progress Notes (Signed)
PROGRESS NOTE  Aimee Jones DVV:[id number] DOB: 1946/06/13 DOA: 07/04/2015 PCP: [name], MD  HPI/Recap of past 66 hours: 69 year old female with past medical history of hypertension, previous stroke and alcohol related dementia currently at skilled nursing for rehabilitation sent over to the emergency room on 9/18 after witnessed seizure-like activity which included an episode of vomiting and then patient lost consciousness. Episode reportedly lasted about 2 minutes. In the emergency room, patient noted to have a sodium level of 122,  and a normal head CT. Neurology evaluated patient and found a number of contributing factors although not a significant outstanding one. With IV fluids, sodium up to 137 by the following day.  Patient today feels very anxious. She was confused last night. White count increased further to 18. Her biggest complaint is of a yeast infection.  Assessment/Plan: Principal Problem:   Leukocytosis: No fever. Unclear etiology. Not on steroids. Urinalysis, C. difficile and chest x-ray on negative. May be stress margination? No signs of active infection. We'll recheck in the morning  Active Problems:   Essential hypertension: Continue home medications   Hyponatremia: Secondary dehydration. Not very impressive as far as cause for seizure. Patient does have chronic issues with this and this may be from medication. Have stopped her doxepin    CKD (chronic kidney disease), stage III: Reported in history, although GFR high and patient with normal renal function currently.    Dementia associated with alcoholism: Stable for now.   Periorbital hematoma of left eye   Seizure-like activity: Seen by neurology. May be multifactorial secondary UTI, starting of tramadol, stopping his Xanax and slightly low sodium.  MRI unrevealing, so at this time as per neurology recommendations, no plans for antiepileptics drug.  EEG unrevealing  Yeast infection: Have started Diflucan and  topical clotrimazole  Code Status:  Full code  Family Communication:  Husband at the bedside  Disposition Plan: Home with home health and private nurse Tamara   Consultants:  Neurology  Procedures:  None  Antibiotics:  IV Rocephin 9/19 1 dose  Diflucan 200 mg 1 dose 9/20   Objective: BP 163/81 mmHg  Pulse 100  Temp(Src) 98.9 F (37.2 C) (Oral)  Resp 19  Ht 5\' 7"  (1.702 m)  Wt 81.3 kg (179 lb 3.7 oz)  BMI 28.07 kg/m2  SpO2 98%  Intake/Output Summary (Last 24 hours) at 07/06/15 1829 Last data filed at 07/06/15 1417  Gross per 24 hour  Intake 2056.25 ml  Output    850 ml  Net 1206.25 ml   Filed Weights   07/05/15 0843  Weight: 81.3 kg (179 lb 3.7 oz)    Exam:   General:   Alert and oriented 2 , mild distress from yeast infection, bruising on left side of face  Cardiovascular:  Regular rate and rhythm, G2-I9, 2/6 systolic ejection murmur  Respiratory:  Clear to auscultation bilaterally  Abdomen:  Soft, nontender, nondistended, positive bowel sounds  Vaginal area: Erythematous  Musculoskeletal:  Trace pitting edema   Data Reviewed: Basic Metabolic Panel:  Recent Labs Lab 07/04/15 1845 07/05/15 0827 07/06/15 0652  NA 122* 137 135  K 3.4* 3.7 3.3*  CL 91* 107 104  CO2 20* 22 23  GLUCOSE 115* 112* 100*  BUN 6 <5* <5*  CREATININE 0.96 0.75 0.83  CALCIUM 8.7* 9.1 9.0  MG 1.7  --   --    Liver Function Tests:  Recent Labs Lab 07/04/15 1845  AST 30  ALT 24  ALKPHOS 82  BILITOT  0.4  PROT 6.1*  ALBUMIN 3.3*   No results for input(s): LIPASE, AMYLASE in the last 168 hours. No results for input(s): AMMONIA in the last 168 hours. CBC:  Recent Labs Lab 07/04/15 1845 07/05/15 0237 07/05/15 0327 07/06/15 0652  WBC 13.6* 10.0 15.4* 16.4*  NEUTROABS 11.2*  --   --   --   HGB 10.0* 6.2* 9.9* 10.6*  HCT 31.1* 19.2* 30.6* 32.6*  MCV 76.2* 77.4* 76.5* 78.6  PLT 672* 429* 704* 731*   Cardiac Enzymes:   No results for input(s):  CKTOTAL, CKMB, CKMBINDEX, TROPONINI in the last 168 hours. BNP (last 3 results)  Recent Labs  07/06/15 1243  BNP 247.2*    ProBNP (last 3 results) No results for input(s): PROBNP in the last 8760 hours.  CBG:  Recent Labs Lab 07/04/15 1859  GLUCAP 122*    Recent Results (from the past 240 hour(s))  C difficile quick scan w PCR reflex     Status: None   Collection Time: 07/05/15  4:20 PM  Result Value Ref Range Status   C Diff antigen NEGATIVE NEGATIVE Final   C Diff toxin NEGATIVE NEGATIVE Final   C Diff interpretation Negative for toxigenic C. difficile  Final     Studies: No results found.  Scheduled Meds: . sodium chloride   Intravenous Once  . atorvastatin  10 mg Oral q1800  . B-complex with vitamin C  1 tablet Oral Daily  . clopidogrel  75 mg Oral Daily  . clotrimazole   Topical BID  . donepezil  10 mg Oral QHS  . enoxaparin (LOVENOX) injection  40 mg Subcutaneous Daily  . fluconazole  200 mg Oral Once  . montelukast  10 mg Oral QHS  . multivitamin with minerals  1 tablet Oral QPM  . omega-3 acid ethyl esters  1,000 mg Oral QPM  . pantoprazole  40 mg Oral Daily  . sodium chloride  3 mL Intravenous Q12H  . thiamine  100 mg Oral Daily  . zolpidem  5 mg Oral QHS    Continuous Infusions:     Time spent: 25 minutes  [place] [facility] Pager [phone]. If 7PM-7AM, please contact night-coverage at [email or web address], password [facility] 07/06/2015, 6:29 PM  LOS: 2 days

## 2015-07-06 NOTE — Progress Notes (Signed)
Routine EEG completed, results pending. 

## 2015-07-06 NOTE — Progress Notes (Signed)
Patient's daughter requested to speak with CM. CM went to room, and spoke with patient's daughter and husband at the bedside. Family expressed need to talk to physician, Dr. Maryland Pink about test results, pain medication, and plan. CM will page MD.

## 2015-07-06 NOTE — Procedures (Signed)
ELECTROENCEPHALOGRAM REPORT   Patient: Aimee Jones       Age: 69 y.o.        Sex: female Referring Physician: Dr Maryland Pink Report Date:  07/06/2015        Interpreting Physician: Hulen Luster  History: Aimee Jones is an 69 y.o. female admitted with new onset seizure  Medications:  Scheduled: . sodium chloride   Intravenous Once  . atorvastatin  10 mg Oral q1800  . B-complex with vitamin C  1 tablet Oral Daily  . clopidogrel  75 mg Oral Daily  . donepezil  10 mg Oral QHS  . enoxaparin (LOVENOX) injection  40 mg Subcutaneous Daily  . montelukast  10 mg Oral QHS  . multivitamin with minerals  1 tablet Oral QPM  . omega-3 acid ethyl esters  1,000 mg Oral QPM  . pantoprazole  40 mg Oral Daily  . sodium chloride  3 mL Intravenous Q12H  . thiamine  100 mg Oral Daily    Conditions of Recording:  This is a 16 channel EEG carried out with the patient in the awake state.  Description:  The waking background activity consists of a low voltage, symmetrical, fairly well organized, 8-9 Hz alpha activity, seen from the parieto-occipital and posterior temporal regions.  No focal slowing or epileptiform activity noted.   The patient drowses with slowing to irregular, low voltage theta and beta activity. Normal sleep architecture is not observed.    Hyperventilation was not performed.  Intermittent photic stimulation was not performed.   IMPRESSION: This is a normal electroencephalogram.  No epileptiform activity is noted.      Jim Like, DO [facility] [phone]  If 7pm- 7am, please page neurology on call as listed in [place]. 07/06/2015, 12:24 PM
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
## 2015-07-07 ENCOUNTER — Encounter: Payer: Self-pay | Admitting: Cardiology

## 2015-07-07 ENCOUNTER — Inpatient Hospital Stay (HOSPITAL_COMMUNITY): Payer: Medicare Other

## 2015-07-07 DIAGNOSIS — E871 Hypo-osmolality and hyponatremia: Secondary | ICD-10-CM

## 2015-07-07 DIAGNOSIS — H05232 Hemorrhage of left orbit: Secondary | ICD-10-CM

## 2015-07-07 DIAGNOSIS — IMO0001 Reserved for inherently not codable concepts without codable children: Secondary | ICD-10-CM | POA: Diagnosis present

## 2015-07-07 DIAGNOSIS — F1027 Alcohol dependence with alcohol-induced persisting dementia: Secondary | ICD-10-CM

## 2015-07-07 DIAGNOSIS — R030 Elevated blood-pressure reading, without diagnosis of hypertension: Secondary | ICD-10-CM

## 2015-07-07 DIAGNOSIS — R569 Unspecified convulsions: Principal | ICD-10-CM

## 2015-07-07 DIAGNOSIS — M25511 Pain in right shoulder: Secondary | ICD-10-CM

## 2015-07-07 DIAGNOSIS — D509 Iron deficiency anemia, unspecified: Secondary | ICD-10-CM

## 2015-07-07 DIAGNOSIS — M25512 Pain in left shoulder: Secondary | ICD-10-CM

## 2015-07-07 LAB — BASIC METABOLIC PANEL
ANION GAP: 9 (ref 5–15)
BUN: 6 mg/dL (ref 6–20)
CALCIUM: 9.2 mg/dL (ref 8.9–10.3)
CHLORIDE: 101 mmol/L (ref 101–111)
CO2: 25 mmol/L (ref 22–32)
CREATININE: 0.8 mg/dL (ref 0.44–1.00)
GFR calc non Af Amer: >60 mL/min (ref >60)
Glucose, Bld: 93 mg/dL (ref 65–99)
Potassium: 3.5 mmol/L (ref 3.5–5.1)
SODIUM: 135 mmol/L (ref 135–145)

## 2015-07-07 LAB — CBC
HEMATOCRIT: 33 % — AB (ref 36.0–46.0)
Hemoglobin: 10.4 g/dL — ABNORMAL LOW (ref 12.0–15.0)
MCH: 24.8 pg — AB (ref 26.0–34.0)
MCHC: 31.5 g/dL (ref 30.0–36.0)
MCV: 78.6 fL (ref 78.0–100.0)
Platelets: 749 10*3/uL — ABNORMAL HIGH (ref 150–400)
RBC: 4.2 MIL/uL (ref 3.87–5.11)
RDW: 22.3 % — AB (ref 11.5–15.5)
WBC: 11.1 10*3/uL — AB (ref 4.0–10.5)

## 2015-07-07 MED ORDER — METOCLOPRAMIDE HCL 5 MG/ML IJ SOLN
5.0000 mg | Freq: Once | INTRAMUSCULAR | Status: AC
  Administered 2015-07-07: 5 mg via INTRAVENOUS
  Filled 2015-07-07: qty 2

## 2015-07-07 MED ORDER — AMLODIPINE BESYLATE 2.5 MG PO TABS: 2.5000 mg | ORAL_TABLET | Freq: Every day | ORAL | Status: DC

## 2015-07-07 MED ORDER — OXYCODONE HCL 5 MG PO TABS: 5.0000 mg | ORAL_TABLET | Freq: Four times a day (QID) | ORAL | Status: DC | PRN

## 2015-07-07 MED ORDER — FERROUS SULFATE 325 (65 FE) MG PO TABS: 325.0000 mg | ORAL_TABLET | Freq: Two times a day (BID) | ORAL | Status: DC

## 2015-07-07 MED ORDER — ALPRAZOLAM 0.5 MG PO TABS: 0.5000 mg | ORAL_TABLET | Freq: Two times a day (BID) | ORAL | Status: DC | PRN

## 2015-07-07 NOTE — Consult Note (Signed)
Aimee Jones M. Viola Kinnick EdD 

## 2015-07-07 NOTE — Discharge Summary (Signed)
Physician Discharge Summary  Aimee Jones RJJ:[id number] DOB: 26-Sep-1946 DOA: 07/04/2015  PCP: [name], MD  Admit date: 07/04/2015 Discharge date: 07/07/2015  Time spent: 35  minutes  Recommendations for Outpatient Follow-up:  1. Discharge home with outpatient PCP follow-up in 1 week 2. Please monitor blood pressure as outpatient. 3. Please refer patient to outpatient GI for evaluation of Iron deficiency anemia and positive for occult blood during previous hospitalization.  Discharge Diagnoses:  Principal Problem:   Hyponatremia   Active Problems:   Essential hypertension   Shoulder pain, bilateral   CKD (chronic kidney disease), stage III   Dementia associated with alcoholism   Iron deficiency anemia   Periorbital hematoma of left eye   Seizure-like activity   Leukocytosis   Yeast infection   Elevated blood pressure   Discharge Condition: Fair   Diet recommendation: Regular  Filed Weights   07/05/15 0843  Weight: 81.3 kg (179 lb 3.7 oz)    History of present illness:  69 year old female with history of hypertension, prior stroke anticoagulated dementia, currently resident of skilled nursing facility was sent to the ED on 9/18 after a witnessed seizure-like activity where she had an episode of vomiting followed by loss of consciousness lasting for almost 2 minutes. In the ED she was found to have a sodium of 122. Head CT was normal. Patient was recently hospitalists for acute encephalopathy suspected secondary to narcotic and benzodiazepine abuse as well as antianxiety medications. She was switched to tramadol. Patient seen by neurology upon admission and her symptoms appear to be multifactorial mostly associated with medications. Patient admitted to hospitalist service. On admission she was found to have significant leukocytosis.    Hospital Course:  Hyponatremia with? Seizure-like activity Appears to be multifactorial secondary to dehydration, recent  starting of tramadol, narcotics and Xanax use. Workup including head CT, MRI brain and EEG were unremarkable. Seen by neurology and recommends discontinuing Xanax. Tramadol has been discontinued as well.  doxepin discontinued. Symptoms have improved and sodium normalized.   Leukocytosis Possibly related to acute distress. No signs of infection. Chest x-ray and UA unremarkable. White count has normalized.  Elevated blood pressure She not on medications. Added low dose amlodipine. Follow-up as outpatient.  Chronic kidney disease Stage II-III Stable.   Dementia associated with prior CVA and alcoholism Stable at baseline.  Right shoulder pain X-ray of the right shoulder unremarkable for any injury or degenerative changes. Has chronic left shoulder osteoarthritis.  Periorbital hematoma of left eye Secondary to fall. Follow-up as outpatient.  Iron deficiency anemia Placed on iron supplement. Stool for occult blood was positive leading previous hospitalization. She will need outpatient GI evaluation.  Diet: Heart healthy  CODE STATUS: Full code  Family communication: None at bedside  Disposition: Home with home health RN/PT/nursing and social work  Procedures:  MRI brain  EEG    Consultations:  Neurology  Discharge Exam: Filed Vitals:   07/07/15 0903  BP: 163/84  Pulse: 88  Temp: 98.7 F (37.1 C)  Resp:     General: Elderly female in no acute distress HEENT: Right periorbital hematoma, no pallor, moist oral mucosa, supple Chest: Clear to auscultation bilaterally, no added sounds CVS: Normal S1 and S2, no murmurs rub or gallop GI: Soft, nondistended, nontender, bowel sounds present Musculoskeletal: Warm, no edema, normal range of motion of bilateral shoulder CNS: Alert and oriented   Discharge Instructions    Current Discharge Medication List    START taking these medications   Details  amLODipine (NORVASC) 2.5 MG tablet Take 1 tablet (2.5 mg total) by  mouth daily. Qty: 30 tablet, Refills: 0    ferrous sulfate 325 (65 FE) MG tablet Take 1 tablet (325 mg total) by mouth 2 (two) times daily with a meal. Qty: 90 tablet, Refills: 0    oxyCODONE (OXY IR/ROXICODONE) 5 MG immediate release tablet Take 1 tablet (5 mg total) by mouth every 6 (six) hours as needed for moderate pain. Qty: 30 tablet, Refills: 0      CONTINUE these medications which have NOT CHANGED   Details  acetaminophen (TYLENOL) 500 MG tablet Take 500 mg by mouth every 6 (six) hours as needed for mild pain.    atorvastatin (LIPITOR) 10 MG tablet Take 10 mg by mouth daily at 6 PM.     azelastine (ASTELIN) 0.1 % nasal spray Place 1 spray into both nostrils 2 (two) times daily. Use in each nostril as directed    b complex vitamins tablet Take 1 tablet by mouth daily.    baclofen (LIORESAL) 10 MG tablet Take 10 mg by mouth 2 (two) times daily as needed for muscle spasms.  Refills: 2    barrier cream (NON-SPECIFIED) CREA Apply 1 application topically 4 (four) times daily as needed (for periarea).    budesonide-formoterol (SYMBICORT) 160-4.5 MCG/ACT inhaler Inhale 2 puffs into the lungs 2 (two) times daily as needed (shortness of breath). Qty: 1 Inhaler, Refills: 12    clopidogrel (PLAVIX) 75 MG tablet Take 1 tablet (75 mg total) by mouth daily. Qty: 90 tablet, Refills: 3   Associated Diagnoses: Stroke    donepezil (ARICEPT) 10 MG tablet TAKE 1 TABLET BY MOUTH DAILY AT BEDTIME Qty: 90 tablet, Refills: 1    doxepin (SINEQUAN) 25 MG capsule TAKE 1 CAPSULE BY MOUTH DAILY AT BEDTIME Qty: 30 capsule, Refills: 5    folic acid (FOLVITE) 1 MG tablet Take 1 mg by mouth daily.    guaiFENesin (MUCINEX) 600 MG 12 hr tablet Take 2 tablets (1,200 mg total) by mouth 2 (two) times daily. Qty: 14 tablet, Refills: 0    montelukast (SINGULAIR) 10 MG tablet Take 10 mg by mouth at bedtime.     Multiple Vitamin (MULTIVITAMIN WITH MINERALS) TABS tablet Take 1 tablet by mouth every evening.     nystatin cream (MYCOSTATIN) Apply 1 application topically 2 (two) times daily.    Omega 3 1000 MG CAPS Take 1,000 mg by mouth every evening.    omeprazole (PRILOSEC) 20 MG capsule TAKE 1 CAPSULE TWICE DAILY FOR 10 DAYS, THEN DECREASE TO NORMAL ONCE DAILY DOSING Qty: 30 capsule, Refills: 5    promethazine (PHENERGAN) 25 MG tablet TAKE 1 TABLET BY MOUTH EVERY 6 HOURS AS NEEDED FOR NAUSEA Qty: 20 tablet, Refills: 0    thiamine 100 MG tablet Take 100 mg by mouth daily.    vitamin E 400 UNIT capsule Take 400 Units by mouth daily.    citalopram (CELEXA) 40 MG tablet Take 1 tablet (40 mg total) by mouth at bedtime. Qty: 90 tablet, Refills: 1    fluticasone (FLONASE) 50 MCG/ACT nasal spray Place 1-2 sprays into both nostrils daily. Qty: 16 g, Refills: 6    nabumetone (RELAFEN) 750 MG tablet TAKE 1 TABLET BY MOUTH 2 TIMES DAILY AS NEEDED FOR PAIN. Qty: 180 tablet, Refills: 0      STOP taking these medications     traMADol (ULTRAM) 50 MG tablet      ALPRAZolam (XANAX) 0.5 MG tablet  Allergies  Allergen Reactions  . Latex Rash  . Penicillins Rash   Follow-up Information    Follow up with [name], MD. Schedule an appointment as soon as possible for a visit in 1 week.   Specialty:  Family Medicine   Contact information:   [place] [address] [phone]        The results of significant diagnostics from this hospitalization (including imaging, microbiology, ancillary and laboratory) are listed below for reference.    Significant Diagnostic Studies: Dg Chest 2 View  07/05/2015   CLINICAL DATA:  Aspiration pneumonia, weakness, history of asthma.  EXAM: CHEST  2 VIEW  COMPARISON:  Chest x-ray dated 06/25/2015.  FINDINGS: Right-sided internal jugular central line has been removed in the interval. Heart size is upper normal, unchanged. Overall cardiomediastinal silhouette is stable in size and configuration. Lungs are clear. No pleural  effusions seen. No pneumothorax. No acute osseous abnormality.  IMPRESSION: No evidence of acute cardiopulmonary abnormality.  Lungs are clear.   Electronically Signed   By: Franki Cabot M.D.   On: 07/05/2015 13:10   Dg Ankle Complete Left  06/22/2015   CLINICAL DATA:  Fall 2 days ago walking, lateral ankle pain  EXAM: LEFT ANKLE COMPLETE - 3+ VIEW  COMPARISON:  None.  FINDINGS: Three views of left ankle submitted. No acute fracture or subluxation. No radiopaque foreign body. Ankle mortise is preserved.  IMPRESSION: Negative.   Electronically Signed   By: Lahoma Crocker M.D.   On: 06/22/2015 13:36   Ct Head Wo Contrast  07/04/2015   CLINICAL DATA:  Fall June 22, 2015 with left-sided bruising. Increased confusion and pain since the accident. Subsequent encounter.  EXAM: CT HEAD WITHOUT CONTRAST  TECHNIQUE: Contiguous axial images were obtained from the base of the skull through the vertex without intravenous contrast.  COMPARISON:  06/22/2015  FINDINGS: Persistent hematoma in the left forehead. No new site of swelling. No acute fracture. No sinus or mastoid fluid (where seen).  Orbits: Bilateral cataract resection.  No traumatic finding.  Brain: No evidence of acute infarction, hemorrhage, hydrocephalus, or mass lesion/mass effect.  Moderate chronic small vessel disease with patchy ischemic gliosis in the cerebral white matter, confluent around the lateral ventricles.  Cerebral volume loss, typical for age.  IMPRESSION: No acute finding or change from prior. No evidence of intracranial injury.   Electronically Signed   By: Monte Fantasia M.D.   On: 07/04/2015 21:33   Ct Head Wo Contrast  06/22/2015   CLINICAL DATA:  Pain following fall  EXAM: CT HEAD WITHOUT CONTRAST  CT MAXILLOFACIAL WITHOUT CONTRAST  TECHNIQUE: Multidetector CT imaging of the head and maxillofacial structures were performed using the standard protocol without intravenous contrast. Multiplanar CT image reconstructions of the maxillofacial  structures were also generated.  COMPARISON:  Head CT October 23, 2014  FINDINGS: CT HEAD FINDINGS  Moderate diffuse atrophy is stable. There is no intracranial mass, hemorrhage, extra-axial fluid collection, or midline shift . There is patchy small vessel disease in the centra semiovale bilaterally. Elsewhere gray-white compartments appear normal no acute infarct evident. Bony calvarium appears intact. Mastoid air cells are clear. There is marked soft tissue swelling over the left frontal bone and orbit. There is a focal soft tissue hematoma over the left frontal bone measuring 2.5 x 1.9 cm.  CT MAXILLOFACIAL FINDINGS  There is marked soft tissue swelling, preseptal, over the left orbit with swelling extending inferiorly to the mid upper face on  the left and superiorly over the left frontal region. There is a hematoma overlying the left frontal bone superior to the left orbit.  No acute fracture or dislocation is evident. There is no intraorbital lesion on either side.  Paranasal sinuses are clear. Ostiomeatal unit complexes are patent bilaterally. There is a concha bullosa on each side, an anatomic variant. Nares are not obstructed. There is no air-fluid level. No bony destruction or expansion.  Salivary glands appear normal. No adenopathy evident. There is probable cerumen in each external auditory canal.  IMPRESSION: CT head: Soft tissue hematoma over the left frontal bone measuring 2.5 x 1.9 cm. There is extensive soft tissue swelling in this area. Elsewhere, there is atrophy with patchy periventricular small vessel disease. No acute infarct evident. No intracranial mass, hemorrhage, or extra-axial fluid collection. No acute calvarial fracture.  CT maxillofacial: Marked soft tissue swelling over the left face and frontal region with an acute hematoma over the left frontal bone superior to the left orbit. There is extensive preseptal soft tissue swelling over the left orbit. No postseptal intraorbital lesion seen  on either side. No fracture or dislocation. Paranasal sinuses clear. Ostiomeatal unit complexes are patent bilaterally. Probable cerumen in each external auditory canal.   Electronically Signed   By: Lowella Grip III M.D.   On: 06/22/2015 10:47   Mr Jeri Cos SH Contrast  07/05/2015   CLINICAL DATA:  Seizure this afternoon. Confusion. History of dementia, alcohol abuse, stroke, headache, hyperlipidemia, hypertension.  EXAM: MRI HEAD WITHOUT AND WITH CONTRAST  TECHNIQUE: Multiplanar, multiecho pulse sequences of the brain and surrounding structures were obtained without and with intravenous contrast.  CONTRAST:  80mL MULTIHANCE GADOBENATE DIMEGLUMINE 529 MG/ML IV SOLN  COMPARISON:  CT head July 04, 2015 and MRI of the brain October 23, 2014  FINDINGS: No reduced diffusion to suggest acute ischemia. No susceptibility artifact to suggest hemorrhage. Moderate ventriculomegaly, proportional enlargement of cerebral sulci and cerebellar folia, similar to prior imaging. Old small RIGHT cerebellar infarcts. Patchy to confluent supratentorial white matter T2 hyperintense signal without midline shift, mass effect or mass lesions. Symmetric appearance of the hippocampi with normal signal, limited assessment for morphology due to mild patient motion.  No abnormal parenchymal or leptomeningeal enhancement. No abnormal extra-axial fluid collections. Normal major intracranial vascular flow voids observed at the skull base. Status post bilateral ocular lens implants. Paranasal sinuses and mastoid air cells are well aerated. No abnormal sellar expansion. No cerebellar tonsillar ectopia. No suspicious calvarial bone marrow signal. Large RIGHT frontal scalp hematoma.  IMPRESSION: No acute intracranial process.  Stable appearance of the brain: Moderate global brain parenchymal volume loss, moderate chronic small vessel ischemic disease.   Electronically Signed   By: Elon Alas M.D.   On: 07/05/2015 01:28   Dg Chest  Port 1 View  06/25/2015   CLINICAL DATA:  Right IJ catheter in place. The line may have been pulled back slightly.  EXAM: PORTABLE CHEST - 1 VIEW  COMPARISON:  Single view of the chest 06/22/2015.  FINDINGS: Right IJ catheter is in place and appears unchanged in position with the tip projecting in the mid superior vena cava. The lungs are clear. Heart size is upper normal. Very small left pleural effusion is noted. Right shoulder replacement is identified.  IMPRESSION: No change in the position of right IJ catheter.  Small left pleural effusion.   Electronically Signed   By: Inge Rise M.D.   On: 06/25/2015 10:59   Dg Chest [facility]  1 View  06/22/2015   CLINICAL DATA:  39-year-old female with hyponatremia. Concern for chest mass.  EXAM: PORTABLE CHEST - 1 VIEW  COMPARISON:  Chest radiograph dated 06/22/2015  FINDINGS: Right IJ central line with tip in stable positioning. The lungs are clear. Stable cardiomegaly. No pleural effusion or pneumothorax. Right shoulder arthroplasty.  IMPRESSION: No acute chest findings.  No Interval change.   Electronically Signed   By: Anner Crete M.D.   On: 06/22/2015 22:32   Dg Chest Port 1 View  06/22/2015   CLINICAL DATA:  Status post central line placement.  EXAM: PORTABLE CHEST - 1 VIEW  COMPARISON:  October 19, 2014.  FINDINGS: The heart size and mediastinal contours are within normal limits. Both lungs are clear. No pneumothorax or pleural effusion is noted. Right shoulder arthroplasty is again noted. Interval placement of right internal jugular catheter with distal tip overlying expected position of the SVC.  IMPRESSION: Interval placement of right internal jugular catheter with distal tip overlying expected position of the SVC. No acute cardiopulmonary abnormality seen.   Electronically Signed   By: Marijo Conception, M.D.   On: 06/22/2015 14:39   Ct Maxillofacial Wo Cm  06/22/2015   CLINICAL DATA:  Pain following fall  EXAM: CT HEAD WITHOUT CONTRAST  CT MAXILLOFACIAL  WITHOUT CONTRAST  TECHNIQUE: Multidetector CT imaging of the head and maxillofacial structures were performed using the standard protocol without intravenous contrast. Multiplanar CT image reconstructions of the maxillofacial structures were also generated.  COMPARISON:  Head CT October 23, 2014  FINDINGS: CT HEAD FINDINGS  Moderate diffuse atrophy is stable. There is no intracranial mass, hemorrhage, extra-axial fluid collection, or midline shift . There is patchy small vessel disease in the centra semiovale bilaterally. Elsewhere gray-white compartments appear normal no acute infarct evident. Bony calvarium appears intact. Mastoid air cells are clear. There is marked soft tissue swelling over the left frontal bone and orbit. There is a focal soft tissue hematoma over the left frontal bone measuring 2.5 x 1.9 cm.  CT MAXILLOFACIAL FINDINGS  There is marked soft tissue swelling, preseptal, over the left orbit with swelling extending inferiorly to the mid upper face on the left and superiorly over the left frontal region. There is a hematoma overlying the left frontal bone superior to the left orbit.  No acute fracture or dislocation is evident. There is no intraorbital lesion on either side.  Paranasal sinuses are clear. Ostiomeatal unit complexes are patent bilaterally. There is a concha bullosa on each side, an anatomic variant. Nares are not obstructed. There is no air-fluid level. No bony destruction or expansion.  Salivary glands appear normal. No adenopathy evident. There is probable cerumen in each external auditory canal.  IMPRESSION: CT head: Soft tissue hematoma over the left frontal bone measuring 2.5 x 1.9 cm. There is extensive soft tissue swelling in this area. Elsewhere, there is atrophy with patchy periventricular small vessel disease. No acute infarct evident. No intracranial mass, hemorrhage, or extra-axial fluid collection. No acute calvarial fracture.  CT maxillofacial: Marked soft tissue swelling  over the left face and frontal region with an acute hematoma over the left frontal bone superior to the left orbit. There is extensive preseptal soft tissue swelling over the left orbit. No postseptal intraorbital lesion seen on either side. No fracture or dislocation. Paranasal sinuses clear. Ostiomeatal unit complexes are patent bilaterally. Probable cerumen in each external auditory canal.   Electronically Signed   By: Lowella Grip III M.D.  On: 06/22/2015 10:47    Microbiology: Recent Results (from the past 240 hour(s))  C difficile quick scan w PCR reflex     Status: None   Collection Time: 07/05/15  4:20 PM  Result Value Ref Range Status   C Diff antigen NEGATIVE NEGATIVE Final   C Diff toxin NEGATIVE NEGATIVE Final   C Diff interpretation Negative for toxigenic C. difficile  Final     Labs: Basic Metabolic Panel:  Recent Labs Lab 07/04/15 1845 07/05/15 0827 07/06/15 0652 07/07/15 0524  NA 122* 137 135 135  K 3.4* 3.7 3.3* 3.5  CL 91* 107 104 101  CO2 20* 22 23 25   GLUCOSE 115* 112* 100* 93  BUN 6 <5* <5* 6  CREATININE 0.96 0.75 0.83 0.80  CALCIUM 8.7* 9.1 9.0 9.2  MG 1.7  --   --   --    Liver Function Tests:  Recent Labs Lab 07/04/15 1845  AST 30  ALT 24  ALKPHOS 82  BILITOT 0.4  PROT 6.1*  ALBUMIN 3.3*   No results for input(s): LIPASE, AMYLASE in the last 168 hours. No results for input(s): AMMONIA in the last 168 hours. CBC:  Recent Labs Lab 07/04/15 1845 07/05/15 0237 07/05/15 0327 07/06/15 0652 07/07/15 0524  WBC 13.6* 10.0 15.4* 16.4* 11.1*  NEUTROABS 11.2*  --   --   --   --   HGB 10.0* 6.2* 9.9* 10.6* 10.4*  HCT 31.1* 19.2* 30.6* 32.6* 33.0*  MCV 76.2* 77.4* 76.5* 78.6 78.6  PLT 672* 429* 704* 731* 749*   Cardiac Enzymes: No results for input(s): CKTOTAL, CKMB, CKMBINDEX, TROPONINI in the last 168 hours. BNP: BNP (last 3 results)  Recent Labs  07/06/15 1243  BNP 247.2*    ProBNP (last 3 results) No results for input(s):  PROBNP in the last 8760 hours.  CBG:  Recent Labs Lab 07/04/15 1859  GLUCAP 122*       Signed:  DHUNGEL, NISHANT  [facility] 07/07/2015, 11:34 AM

## 2015-07-07 NOTE — Progress Notes (Signed)
Pt c/o nausea. 7703 4mg  Zofran given. Nausea unrelieved at 0850. Paged Dr Ortencia Kick. Waiting to hear back frm dr.

## 2015-07-07 NOTE — Care Management Important Message (Signed)
Important Message  Patient Details  Name: Aimee Jones MRN: [id number] Date of Birth: [birth year]   Medicare Important Message Given:  Yes-second notification given    Loann Quill 07/07/2015, 10:58 AM

## 2015-07-08 ENCOUNTER — Encounter: Payer: Self-pay | Admitting: Emergency Medicine

## 2015-07-08 ENCOUNTER — Emergency Department: Payer: Medicare Other

## 2015-07-08 ENCOUNTER — Emergency Department
Admission: EM | Admit: 2015-07-08 | Discharge: 2015-07-08 | Disposition: A | Discharge status: Home / Self Care | Payer: Medicare Other | Attending: Emergency Medicine | Admitting: Emergency Medicine

## 2015-07-08 ENCOUNTER — Ambulatory Visit: Payer: Self-pay | Admitting: Internal Medicine

## 2015-07-08 ENCOUNTER — Telehealth: Payer: Self-pay | Admitting: Family Medicine

## 2015-07-08 DIAGNOSIS — R1032 Left lower quadrant pain: Secondary | ICD-10-CM | POA: Diagnosis not present

## 2015-07-08 DIAGNOSIS — M25511 Pain in right shoulder: Secondary | ICD-10-CM | POA: Insufficient documentation

## 2015-07-08 DIAGNOSIS — M542 Cervicalgia: Secondary | ICD-10-CM | POA: Insufficient documentation

## 2015-07-08 DIAGNOSIS — I10 Essential (primary) hypertension: Secondary | ICD-10-CM | POA: Diagnosis not present

## 2015-07-08 DIAGNOSIS — Z880 Allergy status to penicillin: Secondary | ICD-10-CM | POA: Insufficient documentation

## 2015-07-08 DIAGNOSIS — N390 Urinary tract infection, site not specified: Secondary | ICD-10-CM | POA: Diagnosis not present

## 2015-07-08 DIAGNOSIS — Z79899 Other long term (current) drug therapy: Secondary | ICD-10-CM | POA: Diagnosis not present

## 2015-07-08 DIAGNOSIS — K5730 Diverticulosis of large intestine without perforation or abscess without bleeding: Secondary | ICD-10-CM | POA: Diagnosis not present

## 2015-07-08 DIAGNOSIS — M25512 Pain in left shoulder: Secondary | ICD-10-CM | POA: Insufficient documentation

## 2015-07-08 DIAGNOSIS — R197 Diarrhea, unspecified: Secondary | ICD-10-CM | POA: Diagnosis not present

## 2015-07-08 DIAGNOSIS — K921 Melena: Secondary | ICD-10-CM | POA: Diagnosis present

## 2015-07-08 DIAGNOSIS — G8929 Other chronic pain: Secondary | ICD-10-CM | POA: Insufficient documentation

## 2015-07-08 LAB — URINALYSIS COMPLETE WITH MICROSCOPIC (ARMC ONLY)
BACTERIA UA: NONE SEEN
BILIRUBIN URINE: NEGATIVE
GLUCOSE, UA: NEGATIVE mg/dL
HGB URINE DIPSTICK: NEGATIVE
KETONES UR: NEGATIVE mg/dL
NITRITE: NEGATIVE
PH: 6 (ref 5.0–8.0)
Protein, ur: NEGATIVE mg/dL
Specific Gravity, Urine: 1.021 (ref 1.005–1.030)

## 2015-07-08 LAB — COMPREHENSIVE METABOLIC PANEL
ALBUMIN: 4 g/dL (ref 3.5–5.0)
ALK PHOS: 85 U/L (ref 38–126)
ALT: 18 U/L (ref 14–54)
AST: 29 U/L (ref 15–41)
Anion gap: 10 (ref 5–15)
BILIRUBIN TOTAL: 0.7 mg/dL (ref 0.3–1.2)
BUN: 12 mg/dL (ref 6–20)
CO2: 23 mmol/L (ref 22–32)
CREATININE: 1.05 mg/dL — AB (ref 0.44–1.00)
Calcium: 9.2 mg/dL (ref 8.9–10.3)
Chloride: 97 mmol/L — ABNORMAL LOW (ref 101–111)
GFR calc Af Amer: >60 mL/min (ref >60)
GFR, EST NON AFRICAN AMERICAN: 53 mL/min — AB (ref >60)
GLUCOSE: 111 mg/dL — AB (ref 65–99)
Potassium: 3.5 mmol/L (ref 3.5–5.1)
Sodium: 130 mmol/L — ABNORMAL LOW (ref 135–145)
TOTAL PROTEIN: 6.9 g/dL (ref 6.5–8.1)

## 2015-07-08 LAB — C DIFFICILE QUICK SCREEN W PCR REFLEX
C DIFFICILE (CDIFF) INTERP: NEGATIVE
C DIFFICILE (CDIFF) TOXIN: NEGATIVE
C Diff antigen: NEGATIVE

## 2015-07-08 LAB — CBC
HCT: 35.6 % (ref 35.0–47.0)
Hemoglobin: 11.2 g/dL — ABNORMAL LOW (ref 12.0–16.0)
MCH: 24.5 pg — ABNORMAL LOW (ref 26.0–34.0)
MCHC: 31.4 g/dL — AB (ref 32.0–36.0)
MCV: 78.1 fL — ABNORMAL LOW (ref 80.0–100.0)
PLATELETS: 822 10*3/uL — AB (ref 150–440)
RBC: 4.56 MIL/uL (ref 3.80–5.20)
RDW: 22.9 % — AB (ref 11.5–14.5)
WBC: 10.6 10*3/uL (ref 3.6–11.0)

## 2015-07-08 LAB — LIPASE, BLOOD: Lipase: 27 U/L (ref 22–51)

## 2015-07-08 MED ORDER — IOHEXOL 300 MG/ML  SOLN
100.0000 mL | Freq: Once | INTRAMUSCULAR | Status: AC | PRN
  Administered 2015-07-08: 100 mL via INTRAVENOUS

## 2015-07-08 MED ORDER — IOHEXOL 240 MG/ML SOLN
25.0000 mL | Freq: Once | INTRAMUSCULAR | Status: DC | PRN
  Administered 2015-07-08: 25 mL via ORAL
  Filled 2015-07-08: qty 50

## 2015-07-08 MED ORDER — CEPHALEXIN 500 MG PO CAPS
500.0000 mg | ORAL_CAPSULE | Freq: Once | ORAL | Status: AC
  Administered 2015-07-08: 500 mg via ORAL
  Filled 2015-07-08: qty 1

## 2015-07-08 MED ORDER — HYDROCORTISONE 2.5 % RE CREA: TOPICAL_CREAM | RECTAL | Status: DC

## 2015-07-08 MED ORDER — CEPHALEXIN 500 MG PO CAPS: 500.0000 mg | ORAL_CAPSULE | Freq: Three times a day (TID) | ORAL | Status: AC

## 2015-07-08 NOTE — ED Provider Notes (Signed)
-----------------------------------------   5:55 PM on 07/08/2015 -----------------------------------------    Patient resting comfortably at this time. Senna was to follow-up with urine and C. difficile studies. Physical Exam  BP 177/95 mmHg  Pulse 91  Temp(Src) 98.4 F (36.9 C) (Oral)  Resp 15  Ht 5\' 7"  (1.702 m)  Wt 172 lb (78.019 kg)  BMI 26.93 kg/m2  SpO2 92%  Physical Exam Resting fluid this time and not in any acute distress. ED Course  Procedures  MDM Patient with evidence of UTI. Multiple old cultures reviewed. We'll discharge with Keflex. No anaphylactic reaction to penicillin the past. Says has a rash only.      Orbie Pyo, MD 07/08/15 [phone]

## 2015-07-08 NOTE — ED Notes (Signed)
Pt from home with c/o diarrhea x 2 days. Husband states she has had approximately 20 episodes. She was released from [facility] yesterday after admission for a fall and seizure. During this time, she was taken off of opiates, which she has been on for quite some time due to shoulder injuries. Husband states she is here because of black diarrhea, and wife states it is because of her shoulder pain. Husband does much of the talking for the patient, as she appears to have some memory issues. NAD noted.

## 2015-07-08 NOTE — ED Notes (Signed)
Just released from [facility] .  Last night she stared with black tarry stools.

## 2015-07-08 NOTE — Telephone Encounter (Signed)
Webb Silversmith NP said Dr [name] wants to see pt early next week; Dr [name] will put in order for GI referral and cancel appt with Avie Echevaria NP today; (done cancelled Avie Echevaria NP appt and scheduled 30 min hospital f/u with Dr [name] 07/13/15 at 10:30. Spoke with Mr Even and he does not want to take pt back to [facility] due to not being pleased with the doctors pt had at [facility]. Mr Forlenza will take pt to [facility] ED for eval of black tarry stools. Mr Rotolo voiced understanding of taking pt to ED for eval black tarry stools and appt info.

## 2015-07-08 NOTE — Discharge Instructions (Signed)
Your workup including labs and CT scan do not show any acute abnormalities. As we discussed please drink plenty of fluids over the next several days, he may use over-the-counter loperamide/Imodium as needed for diarrhea symptoms, as written on the box. Please follow up with your primary care physician in the next 1-2 days for recheck. Ultimately you'll need to follow up with GI medicine for a colonoscopy, please call the number provided for GI medicine if you do not yet have a colonoscopy scheduled (which may have occurred during her last hospital admission). Return to the emergency department for any worsening abdominal pain, fever him a black/bloody stool, or any other symptom personally concerning to yourself.   Diarrhea Diarrhea is frequent loose and watery bowel movements. It can cause you to feel weak and dehydrated. Dehydration can cause you to become tired and thirsty, have a dry mouth, and have decreased urination that often is dark yellow. Diarrhea is a sign of another problem, most often an infection that will not last long. In most cases, diarrhea typically lasts 2-3 days. However, it can last longer if it is a sign of something more serious. It is important to treat your diarrhea as directed by your caregiver to lessen or prevent future episodes of diarrhea. CAUSES  Some common causes include:  Gastrointestinal infections caused by viruses, bacteria, or parasites.  Food poisoning or food allergies.  Certain medicines, such as antibiotics, chemotherapy, and laxatives.  Artificial sweeteners and fructose.  Digestive disorders. HOME CARE INSTRUCTIONS  Ensure adequate fluid intake (hydration): Have 1 cup (8 oz) of fluid for each diarrhea episode. Avoid fluids that contain simple sugars or sports drinks, fruit juices, whole milk products, and sodas. Your urine should be clear or pale yellow if you are drinking enough fluids. Hydrate with an oral rehydration solution that you can purchase  at pharmacies, retail stores, and online. You can prepare an oral rehydration solution at home by mixing the following ingredients together:   - tsp table salt.   tsp baking soda.   tsp salt substitute containing potassium chloride.  1  tablespoons sugar.  1 L (34 oz) of water.  Certain foods and beverages may increase the speed at which food moves through the gastrointestinal (GI) tract. These foods and beverages should be avoided and include:  Caffeinated and alcoholic beverages.  High-fiber foods, such as raw fruits and vegetables, nuts, seeds, and whole grain breads and cereals.  Foods and beverages sweetened with sugar alcohols, such as xylitol, sorbitol, and mannitol.  Some foods may be well tolerated and may help thicken stool including:  Starchy foods, such as rice, toast, pasta, low-sugar cereal, oatmeal, grits, baked potatoes, crackers, and bagels.  Bananas.  Applesauce.  Add probiotic-rich foods to help increase healthy bacteria in the GI tract, such as yogurt and fermented milk products.  Wash your hands well after each diarrhea episode.  Only take over-the-counter or prescription medicines as directed by your caregiver.  Take a warm bath to relieve any burning or pain from frequent diarrhea episodes. SEEK IMMEDIATE MEDICAL CARE IF:   You are unable to keep fluids down.  You have persistent vomiting.  You have blood in your stool, or your stools are black and tarry.  You do not urinate in 6-8 hours, or there is only a small amount of very dark urine.  You have abdominal pain that increases or localizes.  You have weakness, dizziness, confusion, or light-headedness.  You have a severe headache.  Your diarrhea gets  worse or does not get better.  You have a fever or persistent symptoms for more than 2-3 days.  You have a fever and your symptoms suddenly get worse. MAKE SURE YOU:   Understand these instructions.  Will watch your condition.  Will  get help right away if you are not doing well or get worse. Document Released: 09/22/2002 Document Revised: 02/16/2014 Document Reviewed: 06/09/2012 [facility] Patient Information 2015 [place], [place]. This information is not intended to replace advice given to you by your health care provider. Make sure you discuss any questions you have with your health care provider.  Urinary Tract Infection Urinary tract infections (UTIs) can develop anywhere along your urinary tract. Your urinary tract is your body's drainage system for removing wastes and extra water. Your urinary tract includes two kidneys, two ureters, a bladder, and a urethra. Your kidneys are a pair of bean-shaped organs. Each kidney is about the size of your fist. They are located below your ribs, one on each side of your spine. CAUSES Infections are caused by microbes, which are microscopic organisms, including fungi, viruses, and bacteria. These organisms are so small that they can only be seen through a microscope. Bacteria are the microbes that most commonly cause UTIs. SYMPTOMS  Symptoms of UTIs may vary by age and gender of the patient and by the location of the infection. Symptoms in young women typically include a frequent and intense urge to urinate and a painful, burning feeling in the bladder or urethra during urination. Older women and men are more likely to be tired, shaky, and weak and have muscle aches and abdominal pain. A fever may mean the infection is in your kidneys. Other symptoms of a kidney infection include pain in your back or sides below the ribs, nausea, and vomiting. DIAGNOSIS To diagnose a UTI, your caregiver will ask you about your symptoms. Your caregiver also will ask to provide a urine sample. The urine sample will be tested for bacteria and white blood cells. White blood cells are made by your body to help fight infection. TREATMENT  Typically, UTIs can be treated with medication. Because most UTIs are caused  by a bacterial infection, they usually can be treated with the use of antibiotics. The choice of antibiotic and length of treatment depend on your symptoms and the type of bacteria causing your infection. HOME CARE INSTRUCTIONS  If you were prescribed antibiotics, take them exactly as your caregiver instructs you. Finish the medication even if you feel better after you have only taken some of the medication.  Drink enough water and fluids to keep your urine clear or pale yellow.  Avoid caffeine, tea, and carbonated beverages. They tend to irritate your bladder.  Empty your bladder often. Avoid holding urine for long periods of time.  Empty your bladder before and after sexual intercourse.  After a bowel movement, women should cleanse from front to back. Use each tissue only once. SEEK MEDICAL CARE IF:   You have back pain.  You develop a fever.  Your symptoms do not begin to resolve within 3 days. SEEK IMMEDIATE MEDICAL CARE IF:   You have severe back pain or lower abdominal pain.  You develop chills.  You have nausea or vomiting.  You have continued burning or discomfort with urination. MAKE SURE YOU:   Understand these instructions.  Will watch your condition.  Will get help right away if you are not doing well or get worse. Document Released: 07/12/2005 Document Revised: 04/02/2012 Document  [employer]® Patient Information ©2015 [employer]. This information is not intended to replace advice given to you by your health care provider. Make sure you discuss any questions you have with your health care provider. ° °

## 2015-07-08 NOTE — ED Notes (Signed)
Pt discharged home after verbalizing understanding of discharge instructions; nad noted. 

## 2015-07-08 NOTE — Telephone Encounter (Signed)
[facility] Patient Name: Aimee Jones DOB: [birth year] Initial Comment caller states his wife was d/c out of the hospital yesterday - today has black, tarry stool- husband wants an appt Nurse Assessment Nurse: Mechele Dawley, RN, [name] Date/Time Eilene Ghazi Time): 07/08/2015 9:05:02 AM Confirm and document reason for call. If symptomatic, describe symptoms. ---SHE WAS IN [place]. HEMATOMA OVER THE FOREHEAD. LOW SODIUM AND HGB LEVELS. SHE WAS HAVING BLACK STOOLS. DC TO [place] HAD A SEIZURE THERE WENT BACK TO ED. MRI AND SCANS SHE WAS DOING PRETTY GOOD. SHE CAME HOME YESTERDAY AND SHE WAS UP AND DOWN GOING TO THE BR AND SHE STARTED HAVING DIARRHEA WITH BLACK STOOLS. BLACK TARRY STOOLS, MOSTLY DIARRHEA. THEY ARE WANTING TO HAVE HER AN APPT WITH GI. SHE HAS SEVERE PAIN INTO HER SHOULDERS. SHE HAS ARTHRITIS IN CERVICAL AREA. THEY HAD TO GO THROUGH STEROID INJECTIONS. SHE WAS ON PERCOCET 10 MG. Has the patient traveled out of the country within the last 30 days? ---Not Applicable Does the patient require triage? ---Yes Related visit to physician within the last 2 weeks? ---Yes Does the PT have any chronic conditions? (i.e. diabetes, asthma, etc.) ---Yes List chronic conditions. ---GI ISSUES Guidelines Guideline Title Affirmed Question Affirmed Notes Rectal Bleeding MODERATE rectal bleeding (small blood clots, passing blood without stool, or toilet water turns red) Final Disposition User See Physician within [place], RN, [name] Referrals REFERRED TO PCP OFFICE Disagree/Comply: Comply

## 2015-07-08 NOTE — ED Provider Notes (Signed)
[facility] Emergency Department Provider Note  Time seen: 11:20 AM  I have reviewed the triage vital signs and the nursing notes.   HISTORY  Chief Complaint Melena    HPI Aimee Jones is a 69 y.o. female with a past medical history of hyperlipidemia, hypertension, depression, dementia, anxiety, suspected EtOH/benzodiazepine/narcotic abuse, presents the emergency department with dark stool. According to the husband, as well as record review the patient was admitted to [facility] approximately 2 weeks ago after multiple falls. The falls were believed to be due to opiate/benzodiazepine/alcohol use/abuse. The patient also has dementia which was believed to be due to alcohol abuse per notes. Patient experienced significant falls with facial hematomas, ecchymosis. Patient underwent extensive imaging including MRI which did not show any acute findings. Patient was discharged to a skilled nursing facility, however several days after discharge the patient experienced a generalized tonic-clonic seizure. The patient was returned to [facility] and found to have a sodium level of 122, and was once again admitted to the hospital. Patient's sodium level was normalized and the patient was discharged home yesterday into the husband's care. It is noted during her hospital stay that the patient had melanoma, and was scheduled for an outpatient colonoscopy in 6 weeks. Husband states they returned to the emergency department today because the patient has been experiencing some left lower quadrant abdominal pain as well as very dark stool intermittently for the past 2 weeks. Husband states the dark stool is worsened and he called his primary care physician but they could not see the patient until next week so they were advised to come to the emergency department for evaluation. He states they came [facility] because they were having some issues at [facility],  but did not elaborate.Patient is complaining of significant neck and shoulder pains. Patient was on opiate and benzodiazepine's chronically for many years per husband, these were both stopped one week ago upon discharge from the hospital, as it was suspected that could've contributed to the patient's multiple recent falls.    Past Medical History  Diagnosis Date  . Hyperlipidemia   . ETOH abuse   . Allergy   . Diverticulosis   . Hypertension   . Depression   . Asthma   . GERD (gastroesophageal reflux disease)   . Normal 24 hour ambulatory pH monitoring study     good acid suppression  . Dementia   . Shortness of breath   . Anxiety     panic attacks- on occas.   . Arthritis     osteoporosis - especially hips.   . Stroke   . Memory loss   . Headache   . Confusion   . Hyponatremia 07/05/2015  . Observed seizure-like activity 07/05/2015    observed by husband     Patient Active Problem List   Diagnosis Date Noted  . Elevated blood pressure 07/07/2015  . Yeast infection 07/06/2015  . Leukocytosis 07/05/2015  . Confusion   . Seizure-like activity 07/04/2015  . Seizure 07/04/2015  . Depression   . GI bleed 06/24/2015  . Frequent falls   . Narcotic abuse   . Benzodiazepine abuse   . Alcohol abuse   . Alcohol use with alcohol-induced persisting dementia   . Generalized anxiety disorder   . Hypokalemia   . Hypomagnesemia   . Traumatic hematoma of left eyebrow   . Periorbital hematoma of left eye   . Anemia, iron deficiency   .  Urinary retention   . Altered mental status 06/22/2015  . Fall 06/22/2015  . Iron deficiency anemia 06/03/2015  . Medicare annual wellness visit, subsequent 04/23/2015  . Advance care planning 04/23/2015  . Senile purpura 04/23/2015  . Abnormal CBC 04/23/2015  . Neck pain 04/23/2015  . Bilateral arm pain 12/16/2014  . Sleep apnea 12/11/2014  . Dementia associated with alcoholism 12/11/2014  . CAP (community acquired pneumonia) 10/20/2014   . Stroke   . Cerebral thrombosis with cerebral infarction 10/13/2014  . HLD (hyperlipidemia)   . Facial droop 10/12/2014  . CKD (chronic kidney disease), stage III 10/12/2014  . S/P shoulder replacement 08/13/2014  . Cough 06/22/2014  . Postmenopausal HRT (hormone replacement therapy) 05/31/2014  . Hyponatremia 05/28/2014  . Shoulder pain, bilateral 10/25/2012  . Parkinsonism 08/22/2012  . MEMORY LOSS 06/22/2010  . ALCOHOL ABUSE 03/08/2010  . 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 OBSTRUCTIVE CHRONIC BRONCHITIS WITH EXACERBATION 01/02/2008  . GLUCOSE INTOLERANCE 11/15/2007  . GERD 07/25/2007  . OSTEOARTHRITIS 07/25/2007  . PANIC ATTACK 05/01/2007  . Depressive disorder, not elsewhere classified 05/01/2007  . Essential hypertension 05/01/2007  . ALLERGIC RHINITIS 05/01/2007  . ASTHMA 05/01/2007  . DIVERTICULOSIS, COLON 05/01/2007  . MENOPAUSAL DISORDER 05/01/2007  . FIBROMYALGIA 05/01/2007    Past Surgical History  Procedure Laterality Date  . Dobutamine stress echo  07/09/2001    normal  . Esophagogastroduodenoscopy  06/11/2006    normal  . Abdominal hysterectomy    . Total hip arthroplasty      left 1991/right 2008  . Nasal sinus surgery  1990  . Eye surgery      cataracts removed, IOL- both eyes   . Breast surgery      3x- biposies   . Shoulder arthroscopy Left   . Reverse shoulder arthroplasty Right 08/13/2014    Procedure: REVERSE SHOULDER ARTHROPLASTY;  Surgeon: Marin Shutter, MD;  Location: [facility];  Service: Orthopedics;  Laterality: Right;  interscalene block  . Joint replacement      bilateral hips  . Loop recorder implant N/A 10/14/2014    Procedure: LOOP RECORDER IMPLANT;  Surgeon: Evans Lance, MD;  Location: [facility] CATH LAB;  Service: Cardiovascular;  Laterality: N/A;  . Tee without cardioversion N/A 10/14/2014    Procedure: TRANSESOPHAGEAL ECHOCARDIOGRAM (TEE);  Surgeon: Sueanne Margarita, MD;  Location: [facility] ENDOSCOPY;  Service: Cardiovascular;  Laterality: N/A;     Current Outpatient Rx  Name  Route  Sig  Dispense  Refill  . acetaminophen (TYLENOL) 500 MG tablet   Oral   Take 500 mg by mouth every 6 (six) hours as needed for mild pain.         Marland Kitchen amLODipine (NORVASC) 2.5 MG tablet   Oral   Take 1 tablet (2.5 mg total) by mouth daily.   30 tablet   0   . atorvastatin (LIPITOR) 10 MG tablet   Oral   Take 10 mg by mouth daily at 6 PM.          . azelastine (ASTELIN) 0.1 % nasal spray   Each Nare   Place 1 spray into both nostrils 2 (two) times daily. Use in each nostril as directed         . b complex vitamins tablet   Oral   Take 1 tablet by mouth daily.         . baclofen (LIORESAL) 10 MG tablet   Oral   Take 10 mg by mouth 2 (two) times daily as needed  for muscle spasms.       2   . barrier cream (NON-SPECIFIED) CREA   Topical   Apply 1 application topically 4 (four) times daily as needed (for periarea).         . budesonide-formoterol (SYMBICORT) 160-4.5 MCG/ACT inhaler   Inhalation   Inhale 2 puffs into the lungs 2 (two) times daily as needed (shortness of breath).   1 Inhaler   12   . citalopram (CELEXA) 40 MG tablet   Oral   Take 1 tablet (40 mg total) by mouth at bedtime.   90 tablet   1   . clopidogrel (PLAVIX) 75 MG tablet   Oral   Take 1 tablet (75 mg total) by mouth daily.   90 tablet   3   . donepezil (ARICEPT) 10 MG tablet      TAKE 1 TABLET BY MOUTH DAILY AT BEDTIME   90 tablet   1   . ferrous sulfate 325 (65 FE) MG tablet   Oral   Take 1 tablet (325 mg total) by mouth 2 (two) times daily with a meal.   90 tablet   0   . fluticasone (FLONASE) 50 MCG/ACT nasal spray   Each Nare   Place 1-2 sprays into both nostrils daily. Patient taking differently: Place 1-2 sprays into both nostrils daily as needed for allergies.    16 g   6   . folic acid (FOLVITE) 1 MG tablet   Oral   Take 1 mg by mouth daily.         Marland Kitchen guaiFENesin (MUCINEX) 600 MG 12 hr tablet   Oral   Take 2 tablets  (1,200 mg total) by mouth 2 (two) times daily.   14 tablet   0   . montelukast (SINGULAIR) 10 MG tablet   Oral   Take 10 mg by mouth at bedtime.          . Multiple Vitamin (MULTIVITAMIN WITH MINERALS) TABS tablet   Oral   Take 1 tablet by mouth every evening.         . nabumetone (RELAFEN) 750 MG tablet      TAKE 1 TABLET BY MOUTH 2 TIMES DAILY AS NEEDED FOR PAIN.   180 tablet   0   . nystatin cream (MYCOSTATIN)   Topical   Apply 1 application topically 2 (two) times daily.         . Omega 3 1000 MG CAPS   Oral   Take 1,000 mg by mouth every evening.         Marland Kitchen omeprazole (PRILOSEC) 20 MG capsule      TAKE 1 CAPSULE TWICE DAILY FOR 10 DAYS, THEN DECREASE TO NORMAL ONCE DAILY DOSING   30 capsule   5   . oxyCODONE (OXY IR/ROXICODONE) 5 MG immediate release tablet   Oral   Take 1 tablet (5 mg total) by mouth every 6 (six) hours as needed for moderate pain.   30 tablet   0   . promethazine (PHENERGAN) 25 MG tablet      TAKE 1 TABLET BY MOUTH EVERY 6 HOURS AS NEEDED FOR NAUSEA   20 tablet   0   . thiamine 100 MG tablet   Oral   Take 100 mg by mouth daily.         . vitamin E 400 UNIT capsule   Oral   Take 400 Units by mouth daily.  Allergies Latex and Penicillins  Family History  Problem Relation Age of Onset  . Sudden death Brother   . Heart attack Brother   . Hypertension Brother   . Esophageal cancer Brother   . Heart attack Father   . Heart block Mother   . Stroke Brother   . Stroke Mother     Social History Social History  Substance Use Topics  . Smoking status: Never Smoker   . Smokeless tobacco: Never Used  . Alcohol Use: 0.0 oz/week    0 Standard drinks or equivalent per week     Comment: grand Mariener - almost daily- 1/2 shot             07/05/2015 no alcohol use at this time     Review of Systems Constitutional: Negative for fever Cardiovascular: Negative for chest pain. Respiratory: Negative for shortness of  breath. Gastrointestinal: Mild left lower quadrant abdominal pain. Positive for dark loose stool. Denies nausea or vomiting. Genitourinary: Negative for dysuria. Musculoskeletal: Positive for neck and bilateral shoulder pains. Chronic issue per patient and husband. Neurological: Negative for headache 10-point ROS otherwise negative.  ____________________________________________   PHYSICAL EXAM:  VITAL SIGNS: ED Triage Vitals  Enc Vitals Group     BP 07/08/15 1036 158/88 mmHg     Pulse Rate 07/08/15 1036 92     Resp 07/08/15 1036 18     Temp 07/08/15 1036 98.4 F (36.9 C)     Temp Source 07/08/15 1036 Oral     SpO2 07/08/15 1036 97 %     Weight 07/08/15 1036 172 lb (78.019 kg)     Height 07/08/15 1036 5\' 7"  (1.702 m)     Head Cir --      Peak Flow --      Pain Score 07/08/15 1041 0     Pain Loc --      Pain Edu? --      Excl. in [place]? --     Constitutional: Alert, pleasant, answers all questions appropriately, follows all commands. Patient does have extensive ecchymosis to her face which appears to be resolving (likely due to her falls several weeks ago). Eyes: Normal exam, left periorbital hematoma, also appears older. ENT   Head: Normocephalic, no signs of acute trauma, but ecchymosis and hematoma as noted above consistent with older trauma.   Mouth/Throat: Mucous membranes are moist. Cardiovascular: Normal rate, regular rhythm. No murmur Respiratory: Normal respiratory effort without tachypnea nor retractions. Breath sounds are clear Gastrointestinal: Mild left lower quadrant tenderness palpation. No rebound or guarding. No distention. Musculoskeletal: Nontender with normal range of motion in all extremities Neurologic:  Normal speech and language. No gross focal neurologic deficits Skin:  Skin is warm, dry and intact.  Psychiatric: Mood and affect are normal. Speech and behavior are normal.  ____________________________________________     RADIOLOGY  CT shows  no acute abnormality.  ____________________________________________    INITIAL IMPRESSION / ASSESSMENT AND PLAN / ED COURSE  Pertinent labs & imaging results that were available during my care of the patient were reviewed by me and considered in my medical decision making (see chart for details).  Patient presents with dark loose stool. Rectal exam is nontender, darker stool but very minimally/trace why it positive. No gross blood. We will check labs, obtain a CT scan of her abdomen and pelvis given dark loose stool and left lower quadrant tenderness to rule out colitis/diverticulitis. We'll monitor closely in the emergency department.  Labs show sodium of 130,  otherwise are largely within normal limits. Normal H&H of 11/35. CT shows no acute abnormalities. We are currently awaiting C. difficile and urinalysis results, anticipate likely discharge home with primary care follow-up. I discussed the findings thus far with the patient and her husband who are agreeable. ____________________________________________   FINAL CLINICAL IMPRESSION(S) / ED DIAGNOSES  Diarrhea Left lower quadrant abdominal pain  Harvest Dark, MD 07/09/15 [phone]

## 2015-07-08 NOTE — Telephone Encounter (Signed)
Referral put in.  The issue was not that I didn't want to see patient today.   I think she needs ER eval instead.   If she is having more black stools, then she may need ER eval since they can turn the labs around quicker.   I'm glad to see her for routine f/u.   Thanks to all involved.

## 2015-07-08 NOTE — ED Notes (Signed)
Pt diaper changed

## 2015-07-08 NOTE — ED Notes (Signed)
Patient transported to CT 

## 2015-07-09 ENCOUNTER — Other Ambulatory Visit: Payer: Self-pay | Admitting: Family Medicine

## 2015-07-10 LAB — URINE CULTURE: Culture: NO GROWTH

## 2015-07-12 ENCOUNTER — Ambulatory Visit (INDEPENDENT_AMBULATORY_CARE_PROVIDER_SITE_OTHER): Payer: Medicare Other | Admitting: *Deleted

## 2015-07-12 ENCOUNTER — Telehealth: Payer: Self-pay | Admitting: *Deleted

## 2015-07-12 DIAGNOSIS — M47812 Spondylosis without myelopathy or radiculopathy, cervical region: Secondary | ICD-10-CM | POA: Diagnosis not present

## 2015-07-12 DIAGNOSIS — M5030 Other cervical disc degeneration, unspecified cervical region: Secondary | ICD-10-CM | POA: Diagnosis not present

## 2015-07-12 DIAGNOSIS — I639 Cerebral infarction, unspecified: Secondary | ICD-10-CM

## 2015-07-12 DIAGNOSIS — I6322 Cerebral infarction due to unspecified occlusion or stenosis of basilar arteries: Secondary | ICD-10-CM

## 2015-07-12 DIAGNOSIS — G894 Chronic pain syndrome: Secondary | ICD-10-CM | POA: Diagnosis not present

## 2015-07-12 DIAGNOSIS — M542 Cervicalgia: Secondary | ICD-10-CM | POA: Diagnosis not present

## 2015-07-12 NOTE — Telephone Encounter (Signed)
Agree, thanks

## 2015-07-12 NOTE — Telephone Encounter (Signed)
Spouse notified of Dr. Marliss Coots instructions and advise pt to go to ER if sxs of dehydration or new sxs. Spouse verbalized understanding and will take pt to ER if sxs worsen if no will keep f/u with Dr. [name] tomorrow

## 2015-07-12 NOTE — Telephone Encounter (Signed)
[facility] Patient Name: Aimee Jones Gender: Female DOB: 06-21-1946 Age: 69 Y 2 M 16 D Return Phone Number: [phone] (Primary) Address: City/State/Zip: [place] Client [place] Night - Client Client Site [place] Physician Renford Dills Contact Type Call Call Type Triage / Clinical Caller Name Yecenia Dalgleish Relationship To Patient Spouse Return Phone Number [phone] (Primary) Chief Complaint Diarrhea Initial Comment Caller states wife has severe diarrhea. Her sodium levels have severely dropped; wants to know if someone can come see her (requesting home health nurse). She has chronic neck pain and has an appointment for this Monday; does not want to miss this but putting her in ER again. PreDisposition Did not know what to do Nurse Assessment Nurse: Mechele Dawley, RN, [name] Date/Time Eilene Ghazi Time): 07/10/2015 2:32:04 PM Confirm and document reason for call. If symptomatic, describe symptoms. ---SEVERE DIARRHEA FOR 2 WEEKS. SHE HAS BEEN IN THE HOSPITAL FOR THIS. SHE HAS BEEN TESTED FOR C-DIFF. THEY ARE CONCERNED ABOUT SODIUM LEVELS. SHE HAD LOST A LOT OF ENERGY LEVELS. ER IN [facility] AND SHE WAS UP TO 130. ER DOCTOR DID ANOTHER C-DIFF AND RECOMMENDED COLONOSCOPY. SHE HAS A SHOULDER PROBLEMS WITH NERVE BLOCK. SHE IS IN PAIN AND ON OPIATE PAIN MEDS. SHE SEE'S PAIN MANAGEMENT. NO BLOOD IN THE STOOLS. DARK STOOLS AND BLACK STOOLS. SHE WAS HAVING BLACK STOOLS PREVIOUSLY. ABD PAIN - SORENESS IS THE COMPLAINT. Has the patient traveled out of the country within the last 30 days? ---Not Applicable Does the patient require triage? ---Yes Related visit to physician within the last 2 weeks? ---Yes Does the PT have any chronic conditions? (i.e. diabetes, asthma, etc.) ---Yes List chronic conditions. ---NECK SHOULDER  ISSUES Guidelines Guideline Title Affirmed Question Affirmed Notes Nurse Date/Time (Eastern Time) Diarrhea [1] MILD (e.g., 1-3 or more stools than normal in past 24 hours) diarrhea [place], RN, [name] 07/10/2015 2:33:26 PM PLEASE NOTE: All timestamps contained within this report are represented as [place] Standard Time. CONFIDENTIALTY NOTICE: This fax transmission is intended only for the addressee. It contains information that is legally privileged, confidential or otherwise protected from use or disclosure. If you are not the intended recipient, you are strictly prohibited from reviewing, disclosing, copying using or disseminating any of this information or taking any action in reliance on or regarding this information. If you have received this fax in error, please notify us immediately by telephone so that we can arrange for its return to [place]. Phone: [phone], Toll-Free: [phone], Fax: [phone] Page: 2 of 2 Call Id: [id number] Guidelines Guideline Title Affirmed Question Affirmed Notes Nurse Date/Time Eilene Ghazi Time) without known cause AND [2] present > 7 days Disp. Time Eilene Ghazi Time) Disposition Final User 07/10/2015 2:38:12 PM See PCP When Office is Open (within 3 days) Yes [place], RN, [name] Caller Understands: Yes Disagree/Comply: Comply Care Advice Given Per Guideline * SPORTS DRINKS: You can also drink a sports drinks (e.g., Gatorade, Powerade) to help treat and prevent dehydration. For it to work best, mix it half and half with water. * Begin with boiled starches / cereals (e.g., potatoes, rice, noodles, wheat, oats) with a small amount of salt to taste. * Other foods that are OK include: bananas, yogurt, crackers, soup. * As the diarrhea starts to get better, you can slowly return to a normal diet. CALL BACK IF: * Signs of dehydration occur (e.g., no urine over 12 hours, very dry mouth,  lightheaded, etc.) * Bloody stools * Constant or severe abdominal pain * You become  worse. After Care Instructions Given Call Event Type User Date / Time Description

## 2015-07-12 NOTE — Telephone Encounter (Signed)
Her PCP is out  If her sodium gets low again she will need correction with IVF in the ED , or if she gets dehydrated (dizzy/dry mouth/rapid heartbeat) She has a f/u with Dr [name] tomorrow   Will cc him

## 2015-07-12 NOTE — Telephone Encounter (Signed)
Refill requested 09/23. Dr. [name] is out of the office.

## 2015-07-13 ENCOUNTER — Ambulatory Visit (INDEPENDENT_AMBULATORY_CARE_PROVIDER_SITE_OTHER): Payer: Medicare Other | Admitting: Family Medicine

## 2015-07-13 ENCOUNTER — Encounter: Payer: Self-pay | Admitting: Family Medicine

## 2015-07-13 ENCOUNTER — Other Ambulatory Visit: Payer: Self-pay | Admitting: Family Medicine

## 2015-07-13 ENCOUNTER — Telehealth: Payer: Self-pay

## 2015-07-13 VITALS — BP 124/62 | HR 98 | Temp 98.1°F | Wt 168.0 lb

## 2015-07-13 DIAGNOSIS — R569 Unspecified convulsions: Secondary | ICD-10-CM | POA: Diagnosis not present

## 2015-07-13 DIAGNOSIS — E871 Hypo-osmolality and hyponatremia: Secondary | ICD-10-CM

## 2015-07-13 DIAGNOSIS — D509 Iron deficiency anemia, unspecified: Secondary | ICD-10-CM

## 2015-07-13 DIAGNOSIS — K921 Melena: Secondary | ICD-10-CM

## 2015-07-13 DIAGNOSIS — I639 Cerebral infarction, unspecified: Secondary | ICD-10-CM

## 2015-07-13 DIAGNOSIS — F1110 Opioid abuse, uncomplicated: Secondary | ICD-10-CM

## 2015-07-13 LAB — CBC WITH DIFFERENTIAL/PLATELET
BASOS PCT: 0.5 % (ref 0.0–3.0)
Basophils Absolute: 0.1 10*3/uL (ref 0.0–0.1)
EOS PCT: 1.6 % (ref 0.0–5.0)
Eosinophils Absolute: 0.2 10*3/uL (ref 0.0–0.7)
HEMATOCRIT: 38.3 % (ref 36.0–46.0)
HEMOGLOBIN: 12.3 g/dL (ref 12.0–15.0)
Lymphocytes Relative: 18.4 % (ref 12.0–46.0)
Lymphs Abs: 2.2 10*3/uL (ref 0.7–4.0)
MCHC: 32.1 g/dL (ref 30.0–36.0)
MCV: 80.1 fl (ref 78.0–100.0)
MONO ABS: 0.9 10*3/uL (ref 0.1–1.0)
Monocytes Relative: 7.8 % (ref 3.0–12.0)
NEUTROS ABS: 8.5 10*3/uL — AB (ref 1.4–7.7)
Neutrophils Relative %: 71.7 % (ref 43.0–77.0)
PLATELETS: 890 10*3/uL — AB (ref 150.0–400.0)
RBC: 4.78 Mil/uL (ref 3.87–5.11)
RDW: 24.3 % — AB (ref 11.5–15.5)
WBC: 11.9 10*3/uL — AB (ref 4.0–10.5)

## 2015-07-13 LAB — BASIC METABOLIC PANEL
BUN: 11 mg/dL (ref 6–23)
CHLORIDE: 91 meq/L — AB (ref 96–112)
CO2: 26 meq/L (ref 19–32)
Calcium: 9.6 mg/dL (ref 8.4–10.5)
Creatinine, Ser: 1 mg/dL (ref 0.40–1.20)
GFR: 58.22 mL/min — ABNORMAL LOW (ref >60.00)
GLUCOSE: 123 mg/dL — AB (ref 70–99)
POTASSIUM: 3.8 meq/L (ref 3.5–5.1)
Sodium: 124 mEq/L — ABNORMAL LOW (ref 135–145)

## 2015-07-13 MED ORDER — HYDROCORTISONE 2.5 % RE CREA: TOPICAL_CREAM | RECTAL | Status: DC

## 2015-07-13 MED ORDER — B COMPLEX PO TABS: 1.0000 | ORAL_TABLET | Freq: Every day | ORAL | Status: DC

## 2015-07-13 MED ORDER — ACETAMINOPHEN 500 MG PO TABS: 500.0000 mg | ORAL_TABLET | Freq: Four times a day (QID) | ORAL | Status: DC | PRN

## 2015-07-13 NOTE — Telephone Encounter (Signed)
Repeat CBC today would be helpful. It may just be the bid iron she started last week.

## 2015-07-13 NOTE — Telephone Encounter (Signed)
Patient has urgent referral in epic for black stools. Nothing available for urgent on my end; advise on scheduling.      Please advise

## 2015-07-13 NOTE — Progress Notes (Signed)
Pre visit review using our clinic review tool, if applicable. No additional management support is needed unless otherwise documented below in the visit note.  Mult issues.  See prev notes about med misuse.  Here today with husband and daughter.   Has seen pain clinic.  Off fent patch now.  Only with tylenol for pain, husband had given >4 gram in a day.  D/w him about strict limits.  Off opiates now.  Living at home with husband.   No opiates in the home now.   Patient has more joint pain with weather changes.    Still with black stools, but on iron.  Could be from GIB vs iron replacement.  D/w pt and family.  No abd pain.  No vomiting.  No BRBPR.    Husband had changed BP meds, with extra CCB and adding back one dose of prev ARB.  I stressed to him not to change meds.  Daughter witnessed the conversation.   She has resolution of prev urinary sx on keflex.  No urinary sx now.   Facial bruising improving recently.    H/o low NA.  Prev with repletion.  Also with MRI w/o changes noted in the sella.  Due for f/u labs.  No SZ activity, no new neurologic sx.  At baseline mentation.  No focal changes.   PMH and SH reviewed  ROS: See HPI, otherwise noncontributory.  Meds, vitals, and allergies reviewed.   nad Resolving bruising noted on the L side of face Mmm Neck supple rrr ctab abd soft Ext w/o edema Speech wnl Gross motor wnl x4 Gait at baseline.

## 2015-07-13 NOTE — Telephone Encounter (Signed)
Aimee Jones has been notified and will have the pt come in for CBC.

## 2015-07-13 NOTE — Telephone Encounter (Signed)
Return call Rosaria Ferries at [phone]

## 2015-07-13 NOTE — Patient Instructions (Addendum)
Finish the keflex.  Stop the nabumetone.  I'll check with Dr. Andree Elk in the meantime.  Max 4 grams of tylenol (1gram 4 every 6 hours) per day.  I'll await input from Dr. Ardis Hughs.  Take care.

## 2015-07-14 ENCOUNTER — Other Ambulatory Visit: Payer: Self-pay | Admitting: Family Medicine

## 2015-07-14 ENCOUNTER — Telehealth: Payer: Self-pay | Admitting: Family Medicine

## 2015-07-14 DIAGNOSIS — E871 Hypo-osmolality and hyponatremia: Secondary | ICD-10-CM

## 2015-07-14 LAB — JAK2 EXONS 12-15

## 2015-07-14 LAB — BCR-ABL1 KINASE DOMAIN MUTATION ANALYSIS

## 2015-07-14 LAB — COMP PANEL: LEUKEMIA/LYMPHOMA

## 2015-07-14 LAB — ANA W/REFLEX: Anti Nuclear Antibody(ANA): NEGATIVE

## 2015-07-14 LAB — HAPTOGLOBIN: HAPTOGLOBIN: 222 mg/dL — AB (ref 34–200)

## 2015-07-14 MED ORDER — CITALOPRAM HYDROBROMIDE 40 MG PO TABS: 20.0000 mg | ORAL_TABLET | Freq: Every day | ORAL | Status: DC

## 2015-07-14 MED ORDER — FLUCONAZOLE 150 MG PO TABS: 150.0000 mg | ORAL_TABLET | Freq: Once | ORAL | Status: DC

## 2015-07-14 NOTE — Assessment & Plan Note (Signed)
No more sx, see notes on labs.   This was likely multifactorial, d/w pt and family.

## 2015-07-14 NOTE — Telephone Encounter (Signed)
Patent's husband notified as instructed by telephone and verbalized understanding. Was advised that patient's daughter is at the store and he will relay the message to her. Lab appointment offered for Friday and patient's husband requested an appointment Thursday to make sure that the results were back before the weekend.

## 2015-07-14 NOTE — Assessment & Plan Note (Signed)
See notes on labs, GI clinic has been contacted about f/u.

## 2015-07-14 NOTE — Assessment & Plan Note (Signed)
See notes on labs.   This was a long patient encounter, talking about her home situation, mult hospital visits, prev labs, and the plan going forward.  >40 minutes spent in face to face time with patient, >50% spent in counselling or coordination of care

## 2015-07-14 NOTE — Assessment & Plan Note (Signed)
With husband's involvement prev.  See prev note.  I didn't write for opiates.  Will check with pain clinic.  I told patient and husband and daughter that I didn't write for opiates and the rationale, since patient was at home with husband.  Per chart, APS was prev contacted.

## 2015-07-14 NOTE — Telephone Encounter (Signed)
Call husband and daughter.   Lab order in for repeat sodium.  Citalopram can rarely cause low NA.  Would cut back to 20mg  a day for 2 weeks, then stop.  Continue to add extra salt to food in meantime.  Thanks.

## 2015-07-15 ENCOUNTER — Other Ambulatory Visit (INDEPENDENT_AMBULATORY_CARE_PROVIDER_SITE_OTHER): Payer: Medicare Other

## 2015-07-15 ENCOUNTER — Ambulatory Visit: Payer: Medicare Other | Admitting: Family Medicine

## 2015-07-15 DIAGNOSIS — E871 Hypo-osmolality and hyponatremia: Secondary | ICD-10-CM | POA: Diagnosis not present

## 2015-07-15 DIAGNOSIS — E785 Hyperlipidemia, unspecified: Secondary | ICD-10-CM

## 2015-07-15 LAB — LIPID PANEL
CHOL/HDL RATIO: 3
CHOLESTEROL: 127 mg/dL (ref 0–200)
HDL: 46.2 mg/dL (ref >39.00)
LDL Cholesterol: 53 mg/dL (ref 0–99)
NonHDL: 81.04
TRIGLYCERIDES: 141 mg/dL (ref 0.0–149.0)
VLDL: 28.2 mg/dL (ref 0.0–40.0)

## 2015-07-15 LAB — BASIC METABOLIC PANEL
BUN: 7 mg/dL (ref 6–23)
CALCIUM: 9.7 mg/dL (ref 8.4–10.5)
CHLORIDE: 101 meq/L (ref 96–112)
CO2: 27 meq/L (ref 19–32)
Creatinine, Ser: 0.91 mg/dL (ref 0.40–1.20)
GFR: 64.91 mL/min (ref >60.00)
Glucose, Bld: 136 mg/dL — ABNORMAL HIGH (ref 70–99)
Potassium: 3.5 mEq/L (ref 3.5–5.1)
SODIUM: 134 meq/L — AB (ref 135–145)

## 2015-07-15 NOTE — Telephone Encounter (Signed)
Patient notified that I had not received anything from Dr. [name] regarding lab results. I advised patient that I would give him a call when I receive feedback on test results. Thanks!

## 2015-07-15 NOTE — Progress Notes (Signed)
Loop recorder 

## 2015-07-15 NOTE — Telephone Encounter (Signed)
Husband called wanting to know if labs were back Please call him with results [phone]

## 2015-07-16 ENCOUNTER — Telehealth: Payer: Self-pay | Admitting: Gastroenterology

## 2015-07-16 ENCOUNTER — Other Ambulatory Visit: Payer: Self-pay | Admitting: Family Medicine

## 2015-07-16 ENCOUNTER — Encounter: Payer: Self-pay | Admitting: Family Medicine

## 2015-07-16 DIAGNOSIS — E871 Hypo-osmolality and hyponatremia: Secondary | ICD-10-CM

## 2015-07-16 NOTE — Telephone Encounter (Signed)
Pts appointment moved to Monday at 9:45am. Pt having lots of diarrhea and not eating much. Pts husband aware of appt.

## 2015-07-19 ENCOUNTER — Encounter: Payer: Self-pay | Admitting: Family Medicine

## 2015-07-19 ENCOUNTER — Encounter: Payer: Self-pay | Admitting: Gastroenterology

## 2015-07-19 ENCOUNTER — Other Ambulatory Visit (INDEPENDENT_AMBULATORY_CARE_PROVIDER_SITE_OTHER): Payer: Medicare Other

## 2015-07-19 ENCOUNTER — Telehealth: Payer: Self-pay | Admitting: *Deleted

## 2015-07-19 ENCOUNTER — Encounter: Payer: Self-pay | Admitting: *Deleted

## 2015-07-19 ENCOUNTER — Ambulatory Visit (INDEPENDENT_AMBULATORY_CARE_PROVIDER_SITE_OTHER): Payer: Medicare Other | Admitting: Gastroenterology

## 2015-07-19 VITALS — BP 136/78 | HR 88 | Ht 65.35 in | Wt 164.2 lb

## 2015-07-19 DIAGNOSIS — R197 Diarrhea, unspecified: Secondary | ICD-10-CM

## 2015-07-19 DIAGNOSIS — I6322 Cerebral infarction due to unspecified occlusion or stenosis of basilar arteries: Secondary | ICD-10-CM

## 2015-07-19 DIAGNOSIS — R195 Other fecal abnormalities: Secondary | ICD-10-CM

## 2015-07-19 LAB — CBC WITH DIFFERENTIAL/PLATELET
BASOS PCT: 0.3 % (ref 0.0–3.0)
Basophils Absolute: 0 10*3/uL (ref 0.0–0.1)
EOS PCT: 2.8 % (ref 0.0–5.0)
Eosinophils Absolute: 0.3 10*3/uL (ref 0.0–0.7)
HCT: 41.2 % (ref 36.0–46.0)
HEMOGLOBIN: 13.1 g/dL (ref 12.0–15.0)
LYMPHS ABS: 2.6 10*3/uL (ref 0.7–4.0)
Lymphocytes Relative: 20.6 % (ref 12.0–46.0)
MCHC: 31.9 g/dL (ref 30.0–36.0)
MCV: 81.2 fl (ref 78.0–100.0)
MONO ABS: 0.6 10*3/uL (ref 0.1–1.0)
Monocytes Relative: 5.2 % (ref 3.0–12.0)
NEUTROS ABS: 8.8 10*3/uL — AB (ref 1.4–7.7)
NEUTROS PCT: 71.1 % (ref 43.0–77.0)
RBC: 5.07 Mil/uL (ref 3.87–5.11)
RDW: 24.9 % — AB (ref 11.5–15.5)
WBC: 12.4 10*3/uL — ABNORMAL HIGH (ref 4.0–10.5)

## 2015-07-19 LAB — COMPREHENSIVE METABOLIC PANEL
ALBUMIN: 4 g/dL (ref 3.5–5.2)
ALT: 15 U/L (ref 0–35)
AST: 17 U/L (ref 0–37)
Alkaline Phosphatase: 77 U/L (ref 39–117)
BUN: 14 mg/dL (ref 6–23)
CHLORIDE: 106 meq/L (ref 96–112)
CO2: 23 meq/L (ref 19–32)
Calcium: 10 mg/dL (ref 8.4–10.5)
Creatinine, Ser: 0.98 mg/dL (ref 0.40–1.20)
GFR: 59.59 mL/min — ABNORMAL LOW (ref >60.00)
Glucose, Bld: 111 mg/dL — ABNORMAL HIGH (ref 70–99)
POTASSIUM: 3.9 meq/L (ref 3.5–5.1)
SODIUM: 138 meq/L (ref 135–145)
Total Bilirubin: 0.4 mg/dL (ref 0.2–1.2)
Total Protein: 7 g/dL (ref 6.0–8.3)

## 2015-07-19 MED ORDER — MOVIPREP 100 G PO SOLR: 1.0000 | Freq: Once | ORAL | Status: DC

## 2015-07-19 NOTE — Patient Instructions (Signed)
You will be set up for a colonoscopy and upper endoscopy for diarrhea, dark stools, fobt + stools. Change iron to once a day instead of twice per day. Continue once daily omeprazole. Start imodium 2 pills twice daily for now (on scheduled basis) You will have labs checked today in the basement lab.  Please head down after you check out with the front desk  (cbc, cmet, stool for routine culture, stool for ova parasites, stool for C. Difficile by PCR).

## 2015-07-19 NOTE — Telephone Encounter (Signed)
07/19/2015   RE: Aimee Jones DOB: 1946-07-13 MRN: [id number]   Dear Dr. Erlinda Hong,    We have scheduled the above patient for an endoscopic procedure. Our records show that she is on anticoagulation therapy.   Please advise as to how long the patient may come off her therapy of Plavix prior to the procedure, which is scheduled for 07-29-15.  Please fax back/ or route the completed form to [place] at [phone].   Sincerely,    Gerlean Ren

## 2015-07-19 NOTE — Progress Notes (Signed)
HPI: This is a  Very pleasant , mildly demented 69 year old woman who is here with her husband and daughter today. I last saw her when she was hospitalized about one month ago.  Chief complaint is   diarrhea   She says she feels aweful from toe pain, headaches, shoulders.  Her stamach is "growly". Not eating much, has been losing weight.  Having diarrhea, 10-20 times per day.  The diarrhea really started about a week before she went to the hospital.  Domenic Moras has stopped.  Completely off of narcotics as well.  No pepto  She is on plavix: for previous stroke.  Dr. Erlinda Hong at [facility].  Cut, pasted from my consult note 1 month ago: Hb was as low as 8.6. MCV low, FOBT + stool: She does not appear to be actively bleeding and as long as that remains the case I recommend against invasive testing (colonoscopy, upper endoscopy) until after she recovers from this acute illness (fall, detox, etc). She needs to absolutely refrain from further NSAID use. She needs to be on twice daily PPI (these are best taken 20-30 min prior to BF and dinner meals). She needs to stop abusing narcotics and stop abusing Etoh (her husband says she doesn't drink much but her daughter tells me that her husband gives her etoh daily). OK to restart plavix tomorrow. Ok to restart diet and advance as tolerated. If she has overt, significant bleeding, please call or page. I am on call this weekend. Otherwise I will plan to get her an appt in my office in 5-6 weeks from now and at that appointment we will discuss colonoscopy and upper endoscopy.   labs last week Hb was normal, bmet normal.\   CT scan with IV and oral contrast 9 2016, abdomen and pelvis was essentially normal    her husband showed me a picture on his phone of her recent diarrhea and it clearly looks melenic. Recent CBC last week was normal however  Past Medical History  Diagnosis Date  . Hyperlipidemia   . ETOH abuse   . Allergy   . Diverticulosis    . Hypertension   . Depression   . Asthma   . GERD (gastroesophageal reflux disease)   . Normal 24 hour ambulatory pH monitoring study     good acid suppression  . Dementia   . Shortness of breath   . Anxiety     panic attacks- on occas.   . Arthritis     osteoporosis - especially hips.   . Stroke ([place])   . Memory loss   . Headache   . Confusion   . Hyponatremia 07/05/2015  . Observed seizure-like activity ([place]) 07/05/2015    observed by husband     Past Surgical History  Procedure Laterality Date  . Dobutamine stress echo  07/09/2001    normal  . Esophagogastroduodenoscopy  06/11/2006    normal  . Abdominal hysterectomy    . Total hip arthroplasty      left 1991/right 2008  . Nasal sinus surgery  1990  . Eye surgery      cataracts removed, IOL- both eyes   . Breast surgery      3x- biposies   . Shoulder arthroscopy Left   . Reverse shoulder arthroplasty Right 08/13/2014    Procedure: REVERSE SHOULDER ARTHROPLASTY;  Surgeon: Marin Shutter, MD;  Location: [place];  Service: Orthopedics;  Laterality: Right;  interscalene block  . Joint replacement  bilateral hips  . Loop recorder implant N/A 10/14/2014    Procedure: LOOP RECORDER IMPLANT;  Surgeon: Evans Lance, MD;  Location: [facility] CATH LAB;  Service: Cardiovascular;  Laterality: N/A;  . Tee without cardioversion N/A 10/14/2014    Procedure: TRANSESOPHAGEAL ECHOCARDIOGRAM (TEE);  Surgeon: Sueanne Margarita, MD;  Location: [facility] ENDOSCOPY;  Service: Cardiovascular;  Laterality: N/A;    Current Outpatient Prescriptions  Medication Sig Dispense Refill  . acetaminophen (TYLENOL) 500 MG tablet Take 1-2 tablets (500-1,000 mg total) by mouth every 6 (six) hours as needed for mild pain. 30 tablet   . amLODipine (NORVASC) 2.5 MG tablet Take 1 tablet (2.5 mg total) by mouth daily. 30 tablet 0  . atorvastatin (LIPITOR) 10 MG tablet Take 10 mg by mouth daily at 6 PM.     . azelastine (ASTELIN) 0.1 % nasal spray Place 1 spray into  both nostrils 2 (two) times daily. Use in each nostril as directed    . b complex vitamins tablet Take 1 tablet by mouth daily.    . barrier cream (NON-SPECIFIED) CREA Apply 1 application topically 4 (four) times daily as needed (for periarea).    . budesonide-formoterol (SYMBICORT) 160-4.5 MCG/ACT inhaler Inhale 2 puffs into the lungs 2 (two) times daily as needed (shortness of breath). 1 Inhaler 12  . clopidogrel (PLAVIX) 75 MG tablet Take 1 tablet (75 mg total) by mouth daily. 90 tablet 3  . donepezil (ARICEPT) 10 MG tablet TAKE 1 TABLET BY MOUTH DAILY AT BEDTIME 90 tablet 1  . ferrous sulfate 325 (65 FE) MG tablet Take 1 tablet (325 mg total) by mouth 2 (two) times daily with a meal. 90 tablet 0  . fluconazole (DIFLUCAN) 150 MG tablet Take 1 tablet (150 mg total) by mouth once. 1 tablet 0  . fluticasone (FLONASE) 50 MCG/ACT nasal spray Place 1-2 sprays into both nostrils daily. (Patient taking differently: Place 1-2 sprays into both nostrils daily as needed for allergies. ) 16 g 6  . folic acid (FOLVITE) 1 MG tablet Take 1 mg by mouth daily.    . hydrocortisone (ANUSOL-HC) 2.5 % rectal cream Apply rectally 2 times daily 30 g 0  . montelukast (SINGULAIR) 10 MG tablet Take 10 mg by mouth at bedtime.     . Multiple Vitamin (MULTIVITAMIN WITH MINERALS) TABS tablet Take 1 tablet by mouth every evening.    . nystatin cream (MYCOSTATIN) Apply 1 application topically 2 (two) times daily.    . Omega 3 1000 MG CAPS Take 1,000 mg by mouth every evening.    Marland Kitchen omeprazole (PRILOSEC) 20 MG capsule TAKE 1 CAPSULE TWICE DAILY FOR 10 DAYS, THEN DECREASE TO NORMAL ONCE DAILY DOSING 30 capsule 5  . promethazine (PHENERGAN) 25 MG tablet TAKE 1 TABLET BY MOUTH EVERY 6 HOURS AS NEEDED FOR NAUSEA 20 tablet 0  . thiamine 100 MG tablet Take 100 mg by mouth daily.    . vitamin E 400 UNIT capsule Take 400 Units by mouth daily.     No current facility-administered medications for this visit.    Allergies as of  07/19/2015 - Review Complete 07/19/2015  Allergen Reaction Noted  . Latex Rash 07/25/2007  . Penicillins Rash 07/25/2007    Family History  Problem Relation Age of Onset  . Sudden death Brother   . Heart attack Brother   . Hypertension Brother   . Esophageal cancer Brother   . Heart attack Father   . Heart block Mother   . Stroke Brother   .  Stroke Mother     Social History   Social History  . Marital Status: Married    Spouse Name: N/A  . Number of Children: N/A  . Years of Education: N/A   Occupational History  . retired     [employer]   Social History Main Topics  . Smoking status: Never Smoker   . Smokeless tobacco: Never Used  . Alcohol Use: 0.0 oz/week    0 Standard drinks or equivalent per week     Comment: grand Mariener - almost daily- 1/2 shot             07/05/2015 no alcohol use at this time   . Drug Use: No  . Sexual Activity: Yes   Other Topics Concern  . Not on file   Social History Narrative   Married. Retired.       Physical Exam: BP 136/78 mmHg  Pulse 88  Ht 5' 5.35" (1.66 m)  Wt 164 lb 4 oz (74.503 kg)  BMI 27.04 kg/m2 Constitutional:  Somewhat confused, she has a bruise her left face that is old , she walks slowly, needs help to get up on the exam table Psychiatric: alert and oriented x 2 Abdomen: soft,  Mildly tender throughout nondistended, no obvious ascites, no peritoneal signs, normal bowel sounds   Assessment and plan: 69 y.o. female with  Dark stools, diarrhea,  Recent FOBT positive stool, ongoing Plavix use   first I think it is very doubtful that the dark stools but she is having represents melanoma since her hemoglobin last week was normal. She does not appear to be actively bleeding. She is taking iron twice a day. That generally causes constipation but perhaps is causing loose stools for her and also accounts for the dark colored. I am asking her to back down on the iron to once daily dosing instead of twice daily dosing.  She will continue taking omeprazole once daily. I think it is a very big improvement that she stopped taking alcohol and stop all narcotics after her last admission. She has mild abdominal tenderness throughout however CAT scan just 2 weeks ago was essentially normal upper abdomen. I would like to proceed with further GI testing including upper endoscopy and colonoscopy at her soonest convenience. We will ask her neurologist if it is safe for her to come off her Plavix for 5 days prior to such a finding. I also recommended to her family that she start taking 2 Imodium twice daily on a scheduled basis to help with her loose stools in the meantime.   She will get a basic workup including lab tests and stool tests, see that summarized in her instructions.    Owens Loffler, MD [facility] 07/19/2015, 9:55 AM

## 2015-07-20 ENCOUNTER — Other Ambulatory Visit: Payer: Medicare Other

## 2015-07-20 ENCOUNTER — Other Ambulatory Visit: Payer: Self-pay | Admitting: Gastroenterology

## 2015-07-20 DIAGNOSIS — R197 Diarrhea, unspecified: Secondary | ICD-10-CM

## 2015-07-21 ENCOUNTER — Encounter (HOSPITAL_COMMUNITY): Payer: Self-pay | Admitting: *Deleted

## 2015-07-21 ENCOUNTER — Other Ambulatory Visit: Payer: Self-pay | Admitting: Family Medicine

## 2015-07-21 ENCOUNTER — Telehealth: Payer: Self-pay

## 2015-07-21 LAB — OVA AND PARASITE EXAMINATION: OP: NONE SEEN

## 2015-07-21 LAB — CLOSTRIDIUM DIFFICILE BY PCR: Toxigenic C. Difficile by PCR: DETECTED — CR

## 2015-07-21 MED ORDER — METRONIDAZOLE 500 MG PO TABS: 500.0000 mg | ORAL_TABLET | Freq: Three times a day (TID) | ORAL | Status: DC

## 2015-07-21 MED ORDER — SACCHAROMYCES BOULARDII 250 MG PO CAPS: 250.0000 mg | ORAL_CAPSULE | Freq: Two times a day (BID) | ORAL | Status: DC

## 2015-07-21 NOTE — Telephone Encounter (Signed)
Sent Dr. Phoebe Sharps response back to sender, Zipporah Plants, RN. Please advise Dr. Erlinda Hong if you need anything further from [place]. Thanks!

## 2015-07-21 NOTE — Telephone Encounter (Signed)
Hi, Katrina:  Could you please prepare the letter saying "plavix can be stopped 5 days prior to the procedure and resume after the procedure once pt hemodynamically stable with a small but acceptable peri-operatiive procedure risk for TIA/stroke if patient is willing". Thanks.  Rosalin Hawking, MD PhD Stroke Neurology 07/21/2015 8:27 AM

## 2015-07-21 NOTE — Telephone Encounter (Signed)
I took a call from [facility] labs with a positive c-diff.  Discussed with Dr. Hilarie Fredrickson  Flagyl 500 mg 1 po tid for 14 days Stop imodium Florastor 250 mg 1 po BID for 1 month Husband notified.  He is advised to have patient avoid all alcohol, clean with bleach after each BM, isolate herself to her own bathroom if possible. He is advised to have her call back if the diarrhea does not improve in a few days.

## 2015-07-21 NOTE — Telephone Encounter (Signed)
Patient notified and verbalized understanding. Patient has no question or concerns at this time.

## 2015-07-22 ENCOUNTER — Encounter: Payer: Self-pay | Admitting: Internal Medicine

## 2015-07-22 DIAGNOSIS — J3081 Allergic rhinitis due to animal (cat) (dog) hair and dander: Secondary | ICD-10-CM | POA: Diagnosis not present

## 2015-07-22 DIAGNOSIS — J301 Allergic rhinitis due to pollen: Secondary | ICD-10-CM | POA: Diagnosis not present

## 2015-07-22 LAB — CUP PACEART REMOTE DEVICE CHECK: MDC IDC SESS DTM: 20160926233640

## 2015-07-22 NOTE — Progress Notes (Signed)
Carelink summary report received. Battery status OK. Normal device function. No new symptom, brady, pause, or AF episodes. 4 tachy episodes--available EGM shows artifact (patient had seizure-like activity around this time per ED note from 07/04/15). Monthly summary reports and ROV with [facility] in 10/2015.

## 2015-07-22 NOTE — Telephone Encounter (Signed)
Pt has been rescheduled to 08/26/15 730 am message left to notify pt and re instruct.

## 2015-07-22 NOTE — Telephone Encounter (Signed)
Pt has been re instructed and will call with any concerns

## 2015-07-22 NOTE — Telephone Encounter (Signed)
Last refilled on 07/12/15 #20 with 0 refills, please advise

## 2015-07-22 NOTE — Telephone Encounter (Signed)
She is currently scheduled for colonoscopy next week.  In light of the C. Diff + diarrhea I would like to postpone the test by 3-4 weeks.  Aimee Jones, she needs colonoscopy [facility] in 3-4 weeks rather than next week.  Thanks

## 2015-07-23 NOTE — Telephone Encounter (Signed)
Get update from daughter before filling.  Thanks.

## 2015-07-23 NOTE — Telephone Encounter (Signed)
Spoke to daughter who states that GI Dx pt with CDiff. Colonoscopy and endoscopy cancelled; was scheduled for 10/13. She states that she is having a hard time keeping out of the bed. She states that her dad probably made the refill request for medication, not for nausea, but to help her sleep. She states that she is staying in bed appx 16hrs/day. Daughter is not wanting pt to receive Rx, as she wanting her to "get up and move around." She feels as though pt is requesting medication at the time she is not needing. She believes that her dad may not be the best care taker for her mother at the present time. She states she is VERY concerned that the pt "is going to die." Pt had acupuncture for pain that was unsuccessful. She is wanting to speak with Dr [name] directly as she is "at the end of her rope" with options.

## 2015-07-23 NOTE — Telephone Encounter (Signed)
Lm on Andrea's vm as requested by Dr [name]
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
## 2015-07-24 LAB — STOOL CULTURE

## 2015-07-26 NOTE — Telephone Encounter (Signed)
No return call from daughter.

## 2015-07-28 ENCOUNTER — Telehealth: Payer: Self-pay | Admitting: Gastroenterology

## 2015-07-28 NOTE — Telephone Encounter (Signed)
Pts husband calling and states that his wife is not any better since her OV. States she is still having to go to the bathroom with diarrhea multiple times during the night. Pt was started on Flagyl for  cdiff 07/21/15. Please advise.

## 2015-07-29 ENCOUNTER — Ambulatory Visit (INDEPENDENT_AMBULATORY_CARE_PROVIDER_SITE_OTHER): Payer: Medicare Other | Admitting: Gastroenterology

## 2015-07-29 ENCOUNTER — Other Ambulatory Visit (INDEPENDENT_AMBULATORY_CARE_PROVIDER_SITE_OTHER): Payer: Medicare Other

## 2015-07-29 ENCOUNTER — Encounter: Payer: Self-pay | Admitting: Gastroenterology

## 2015-07-29 ENCOUNTER — Telehealth: Payer: Self-pay

## 2015-07-29 VITALS — BP 130/70 | HR 70 | Ht 67.0 in | Wt 165.0 lb

## 2015-07-29 DIAGNOSIS — J3081 Allergic rhinitis due to animal (cat) (dog) hair and dander: Secondary | ICD-10-CM | POA: Diagnosis not present

## 2015-07-29 DIAGNOSIS — N390 Urinary tract infection, site not specified: Secondary | ICD-10-CM | POA: Diagnosis not present

## 2015-07-29 DIAGNOSIS — R197 Diarrhea, unspecified: Secondary | ICD-10-CM

## 2015-07-29 DIAGNOSIS — Z9181 History of falling: Secondary | ICD-10-CM | POA: Diagnosis not present

## 2015-07-29 DIAGNOSIS — R112 Nausea with vomiting, unspecified: Secondary | ICD-10-CM

## 2015-07-29 DIAGNOSIS — E871 Hypo-osmolality and hyponatremia: Secondary | ICD-10-CM | POA: Diagnosis not present

## 2015-07-29 DIAGNOSIS — N183 Chronic kidney disease, stage 3 (moderate): Secondary | ICD-10-CM | POA: Diagnosis not present

## 2015-07-29 DIAGNOSIS — J3089 Other allergic rhinitis: Secondary | ICD-10-CM | POA: Diagnosis not present

## 2015-07-29 DIAGNOSIS — I6322 Cerebral infarction due to unspecified occlusion or stenosis of basilar arteries: Secondary | ICD-10-CM

## 2015-07-29 DIAGNOSIS — I129 Hypertensive chronic kidney disease with stage 1 through stage 4 chronic kidney disease, or unspecified chronic kidney disease: Secondary | ICD-10-CM | POA: Diagnosis not present

## 2015-07-29 LAB — BASIC METABOLIC PANEL
BUN: 10 mg/dL (ref 6–23)
CO2: 27 meq/L (ref 19–32)
Calcium: 9.6 mg/dL (ref 8.4–10.5)
Chloride: 105 mEq/L (ref 96–112)
Creatinine, Ser: 0.85 mg/dL (ref 0.40–1.20)
GFR: 70.22 mL/min (ref >60.00)
GLUCOSE: 101 mg/dL — AB (ref 70–99)
Potassium: 3.7 mEq/L (ref 3.5–5.1)
SODIUM: 138 meq/L (ref 135–145)

## 2015-07-29 MED ORDER — VANCOMYCIN HCL 125 MG PO CAPS: 125.0000 mg | ORAL_CAPSULE | Freq: Four times a day (QID) | ORAL | Status: DC

## 2015-07-29 MED ORDER — NA SULFATE-K SULFATE-MG SULF 17.5-3.13-1.6 GM/177ML PO SOLN: 1.0000 | Freq: Once | ORAL | Status: DC

## 2015-07-29 NOTE — Progress Notes (Signed)
Review of pertinent gastrointestinal problems: 1. FOBT positive stool, 06/2015 when in hosp 2. Diarrhea: started 06/2015: labs 07/2015: C. Diff + by PCR, routine stool culture neg, ova and parasites neg; CBC with slightly elevated WBC, not anemic.  Was started on flagyl 521m tid on 10/5.   HPI: This is a  very pleasant 69year old woman whom I last saw about 10 days ago here in the office. She is with her husband again today.  She started Flagyl for PCR positive C. difficile associated diarrhea 1 week ago. She has not really noticed any improvement in her diarrhea since then. Her husband reports she goes several times during the night and multiple times throughout the day. She is not having fevers or chills. She has not had significant abdominal pain. Her nausea which has been chronic may be worse now that she start the Flagyl. She has had a bit of vomiting as well.  Chief complaint is  diarrhea   Past Medical History  Diagnosis Date  . Hyperlipidemia   . ETOH abuse   . Allergy   . Diverticulosis   . Hypertension   . Depression   . Asthma   . GERD (gastroesophageal reflux disease)   . Normal 24 hour ambulatory pH monitoring study     good acid suppression  . Dementia   . Shortness of breath   . Anxiety     panic attacks- on occas.   . Arthritis     osteoporosis - especially hips.   . [place]   . Memory loss   . Headache   . Confusion   . Hyponatremia 07/05/2015  . [facility]    observed by husband , Dr rDoy Mince   Past Surgical History  Procedure Laterality Date  . Dobutamine stress echo  07/09/2001    normal  . Esophagogastroduodenoscopy  06/11/2006    normal  . Abdominal hysterectomy    . Total hip arthroplasty      left 1991/right 2008  . Nasal sinus surgery  1990  . Eye surgery      cataracts removed, IOL- both eyes   . Breast surgery      3x- biposies   . Shoulder arthroscopy Left   . Reverse shoulder arthroplasty  Right 08/13/2014    Procedure: REVERSE SHOULDER ARTHROPLASTY;  Surgeon: KMarin Shutter MD;  [facility]  Service: Orthopedics;  Laterality: Right;  interscalene block  . Joint replacement      bilateral hips  . Loop recorder implant N/A 10/14/2014    Procedure: LOOP RECORDER IMPLANT;  Surgeon: GEvans Lance MD;  [facility];  Service: Cardiovascular;  Laterality: N/A;  . Tee without cardioversion N/A 10/14/2014    Procedure: TRANSESOPHAGEAL ECHOCARDIOGRAM (TEE);  Surgeon: TSueanne Margarita MD;  [facility];  Service: Cardiovascular;  Laterality: N/A;    Current Outpatient Prescriptions  Medication Sig Dispense Refill  . acetaminophen (TYLENOL) 500 MG tablet Take 1-2 tablets (500-1,000 mg total) by mouth every 6 (six) hours as needed for mild pain. 30 tablet   . amLODipine (NORVASC) 2.5 MG tablet Take 1 tablet (2.5 mg total) by mouth daily. 30 tablet 0  . atorvastatin (LIPITOR) 10 MG tablet Take 10 mg by mouth daily at 6 PM.     . azelastine (ASTELIN) 0.1 % nasal spray Place 1 spray into both nostrils 2 (two) times daily. Use in each nostril as directed    . b complex vitamins tablet Take  1 tablet by mouth daily.    . barrier cream (NON-SPECIFIED) CREA Apply 1 application topically 4 (four) times daily as needed (for periarea).    . budesonide-formoterol (SYMBICORT) 160-4.5 MCG/ACT inhaler Inhale 2 puffs into the lungs 2 (two) times daily as needed (shortness of breath). 1 Inhaler 12  . clopidogrel (PLAVIX) 75 MG tablet Take 1 tablet (75 mg total) by mouth daily. 90 tablet 3  . donepezil (ARICEPT) 10 MG tablet TAKE 1 TABLET BY MOUTH DAILY AT BEDTIME 90 tablet 1  . ferrous sulfate 325 (65 FE) MG tablet Take 1 tablet (325 mg total) by mouth 2 (two) times daily with a meal. (Patient taking differently: Take 325 mg by mouth daily with breakfast. ) 90 tablet 0  . fluconazole (DIFLUCAN) 150 MG tablet Take 1 tablet (150 mg total) by mouth once. (Patient taking differently:  Take 150 mg by mouth once. As need for infection) 1 tablet 0  . fluticasone (FLONASE) 50 MCG/ACT nasal spray Place 1-2 sprays into both nostrils daily. (Patient taking differently: Place 1-2 sprays into both nostrils daily as needed for allergies. ) 16 g 6  . folic acid (FOLVITE) 1 MG tablet Take 1 mg by mouth daily.    . hydrocortisone (ANUSOL-HC) 2.5 % rectal cream Apply rectally 2 times daily 30 g 0  . metroNIDAZOLE (FLAGYL) 500 MG tablet Take 1 tablet (500 mg total) by mouth 3 (three) times daily. 42 tablet 0  . montelukast (SINGULAIR) 10 MG tablet Take 10 mg by mouth at bedtime.     Marland Kitchen MOVIPREP 100 G SOLR Take 1 kit (200 g total) by mouth once. 1 kit 0  . Multiple Vitamin (MULTIVITAMIN WITH MINERALS) TABS tablet Take 1 tablet by mouth every evening.    . nystatin cream (MYCOSTATIN) Apply 1 application topically 2 (two) times daily.    . Omega 3 1000 MG CAPS Take 1,000 mg by mouth every evening.    Marland Kitchen omeprazole (PRILOSEC) 20 MG capsule TAKE 1 CAPSULE TWICE DAILY FOR 10 DAYS, THEN DECREASE TO NORMAL ONCE DAILY DOSING 30 capsule 5  . PRESCRIPTION MEDICATION Supportive therapy [facility]    . promethazine (PHENERGAN) 25 MG tablet TAKE 1 TABLET BY MOUTH EVERY 6 HOURS AS NEEDED FOR NAUSEA 20 tablet 0  . saccharomyces boulardii (FLORASTOR) 250 MG capsule Take 1 capsule (250 mg total) by mouth 2 (two) times daily. 60 capsule 0  . thiamine 100 MG tablet Take 100 mg by mouth daily.    . vitamin E 400 UNIT capsule Take 400 Units by mouth daily.     No current facility-administered medications for this visit.    Allergies as of 07/29/2015 - Review Complete 07/29/2015  Allergen Reaction Noted  . Latex Rash 07/25/2007  . Penicillins Rash 07/25/2007    Family History  Problem Relation Age of Onset  . Sudden death Brother   . Heart attack Brother   . Hypertension Brother   . Esophageal cancer Brother   . Heart attack Father   . Heart block Mother   . Stroke Brother   . Stroke Mother     Social  History   Social History  . Marital Status: Married    Spouse Name: N/A  . Number of Children: N/A  . Years of Education: N/A   Occupational History  . retired     [employer]   Social History Main Topics  . Smoking status: Never Smoker   . Smokeless tobacco: Never Used  . Alcohol Use:  0.0 oz/week    0 Standard drinks or equivalent per week     Comment: grand Mariener - almost daily- 1/2 shot             07/05/2015 no alcohol use at this time   . Drug Use: No  . Sexual Activity: Yes   Other Topics Concern  . Not on file   Social History Narrative   Married. Retired.       Physical Exam: Pulse 70  Ht _0  (1.702 m)  Wt 165 lb (74.844 kg)  BMI 25.84 kg/m2 Constitutional: Mildly demented but does seem to be aware of what's going on Psychiatric: alert and oriented x3 Abdomen: soft, nontender, nondistended, no obvious ascites, no peritoneal signs, normal bowel sounds   Assessment and plan: 69 y.o. female with diarrhea, PCR positive for C. difficile  She is failing to respond to Flagyl and is probably having some side effects from the Flagyl with worsening of her nausea, vomiting at times. I'm going to change her to by mouth vancomycin 125 mg 4 times daily for 2 weeks. I recommended that they give her an Imodium at bedtime every night. She does not at all seem to be at risk for megacolon think it would probably help her get some rest overnight. She will stop Flagyl. I had originally planned to delay her colonoscopy to workup her fecal occult positive stool until after we can get this diarrhea under control but she doesn't seem to be responding to usual treatments for Clostridium difficile and I'm wondering if it is a false positive PCR result. We'll put her back on for colonoscopy as well as upper endoscopy next week at [facility]. She has previously been okay to hold her Plavix for 5 days and we will do that again for her for this.   Owens Loffler, MD [place]  [facility] 07/29/2015, 2:46 PM

## 2015-07-29 NOTE — Telephone Encounter (Signed)
Pt has been given appt for today at 3, her husband was notified.

## 2015-07-29 NOTE — Patient Instructions (Signed)
You have been scheduled for an endoscopy and colonoscopy. Please follow the written instructions given to you at your visit today. Please pick up your prep supplies at the pharmacy within the next 1-3 days. If you use inhalers (even only as needed), please bring them with you on the day of your procedure. Your physician has requested that you go to www.startemmi.com and enter the access code given to you at your visit today. This web site gives a general overview about your procedure. However, you should still follow specific instructions given to you by our office regarding your preparation for the procedure. We have sent the following medications to your pharmacy for you to pick up at your convenience:  Vancomycin, Suprep Stop the flagyl and start the vancomycin Take 1 imodium every night before bed.

## 2015-07-29 NOTE — Telephone Encounter (Signed)
I'm at [facility] today but can make it for office appt at 3pm probably. Can she come in for an OV with me at 3 today??

## 2015-07-29 NOTE — Telephone Encounter (Signed)
RE: Aimee Jones DOB: 10-Apr-1946 MRN: [id number]   Dear Dr Lovena Le,    We have scheduled the above patient for an endoscopic procedure. Our records show that she is on anticoagulation therapy.   Please advise as to how long the patient may come off her therapy of Plavix prior to the procedure, which is scheduled for 08/05/15.  Please fax back/ or route the completed form to Rhyann Berton at [phone].   Sincerely,    Christian Mate    Please respond by 07/30/15 and route back to Christian Mate

## 2015-07-30 ENCOUNTER — Telehealth: Payer: Self-pay | Admitting: Internal Medicine

## 2015-07-30 ENCOUNTER — Encounter (HOSPITAL_COMMUNITY): Payer: Self-pay | Admitting: *Deleted

## 2015-07-30 ENCOUNTER — Telehealth: Payer: Self-pay | Admitting: Gastroenterology

## 2015-07-30 ENCOUNTER — Telehealth: Payer: Self-pay | Admitting: *Deleted

## 2015-07-30 NOTE — Telephone Encounter (Signed)
The Plavix is prescribed Neuro

## 2015-07-30 NOTE — Telephone Encounter (Signed)
L/M for patient to make sure she  Still knows to hold Plavix 5 days before procedure per Neurologist Jindong Xu,MD     See 10-5 phone not by [facility]

## 2015-07-30 NOTE — Telephone Encounter (Signed)
New Message  Robin from [facility] following up on clearance routed to Dr Lovena Le RN today 07/30/2015. Please call back and discuss.

## 2015-07-31 NOTE — Telephone Encounter (Signed)
I don't have any experience with that antibiotic to treat c. Diff.  The treatment of choice is with vancomycin orally.  Please check with drug company about getting her samples if possible.

## 2015-08-02 ENCOUNTER — Other Ambulatory Visit: Payer: Self-pay | Admitting: Family Medicine

## 2015-08-02 DIAGNOSIS — M47812 Spondylosis without myelopathy or radiculopathy, cervical region: Secondary | ICD-10-CM | POA: Diagnosis not present

## 2015-08-02 DIAGNOSIS — M542 Cervicalgia: Secondary | ICD-10-CM | POA: Diagnosis not present

## 2015-08-02 DIAGNOSIS — Z79899 Other long term (current) drug therapy: Secondary | ICD-10-CM | POA: Diagnosis not present

## 2015-08-02 DIAGNOSIS — M5030 Other cervical disc degeneration, unspecified cervical region: Secondary | ICD-10-CM | POA: Diagnosis not present

## 2015-08-02 DIAGNOSIS — G894 Chronic pain syndrome: Secondary | ICD-10-CM | POA: Diagnosis not present

## 2015-08-03 DIAGNOSIS — J3081 Allergic rhinitis due to animal (cat) (dog) hair and dander: Secondary | ICD-10-CM | POA: Diagnosis not present

## 2015-08-03 DIAGNOSIS — J3089 Other allergic rhinitis: Secondary | ICD-10-CM | POA: Diagnosis not present

## 2015-08-03 MED ORDER — VANCOMYCIN 50 MG/ML ORAL SOLUTION: 125.0000 mg | Freq: Four times a day (QID) | ORAL | Status: DC

## 2015-08-03 NOTE — Telephone Encounter (Signed)
Prescription sent to [facility] as a suspension, message left for the pt to return call to discuss change in pharmacy and medication.  [place] may be cheaper for the pt as well as suspension vs capsule.

## 2015-08-04 ENCOUNTER — Telehealth: Payer: Self-pay | Admitting: *Deleted

## 2015-08-04 ENCOUNTER — Other Ambulatory Visit: Payer: Self-pay | Admitting: Family Medicine

## 2015-08-04 ENCOUNTER — Encounter: Payer: Self-pay | Admitting: Internal Medicine

## 2015-08-04 NOTE — Telephone Encounter (Signed)
Patient may stop plavix for 5 days prior to the scheduled procedure and resume it after the procedure when safe, with a small but acceptable peri-operatiive procedure risk for TIA/stroke if patient is willing.         Rosalin Hawking, MD PhD    Stroke Neurology    08/03/2015    6:22 PM            ----- Message -----     From: Oda Kilts, CMA     Sent: 07/30/2015  1:40 PM      To: Rosalin Hawking, MD        Please advise

## 2015-08-05 ENCOUNTER — Telehealth: Payer: Self-pay

## 2015-08-05 ENCOUNTER — Ambulatory Visit (HOSPITAL_COMMUNITY)
Admission: RE | Admit: 2015-08-05 | Discharge: 2015-08-05 | Disposition: A | Discharge status: Home / Self Care | Payer: Medicare Other | Source: Ambulatory Visit | Attending: Gastroenterology | Admitting: Gastroenterology

## 2015-08-05 ENCOUNTER — Encounter (HOSPITAL_COMMUNITY)
Admission: RE | Disposition: A | Discharge status: Home / Self Care | Payer: Self-pay | Source: Ambulatory Visit | Attending: Gastroenterology

## 2015-08-05 ENCOUNTER — Encounter (HOSPITAL_COMMUNITY): Payer: Self-pay | Admitting: Gastroenterology

## 2015-08-05 ENCOUNTER — Institutional Professional Consult (permissible substitution): Payer: Medicare Other | Admitting: Internal Medicine

## 2015-08-05 ENCOUNTER — Ambulatory Visit (HOSPITAL_COMMUNITY): Payer: Medicare Other | Admitting: Anesthesiology

## 2015-08-05 DIAGNOSIS — R112 Nausea with vomiting, unspecified: Secondary | ICD-10-CM

## 2015-08-05 DIAGNOSIS — Z96643 Presence of artificial hip joint, bilateral: Secondary | ICD-10-CM | POA: Insufficient documentation

## 2015-08-05 DIAGNOSIS — J449 Chronic obstructive pulmonary disease, unspecified: Secondary | ICD-10-CM | POA: Insufficient documentation

## 2015-08-05 DIAGNOSIS — Z79899 Other long term (current) drug therapy: Secondary | ICD-10-CM | POA: Diagnosis not present

## 2015-08-05 DIAGNOSIS — Z7952 Long term (current) use of systemic steroids: Secondary | ICD-10-CM | POA: Diagnosis not present

## 2015-08-05 DIAGNOSIS — M1990 Unspecified osteoarthritis, unspecified site: Secondary | ICD-10-CM | POA: Diagnosis not present

## 2015-08-05 DIAGNOSIS — A047 Enterocolitis due to Clostridium difficile: Secondary | ICD-10-CM | POA: Diagnosis not present

## 2015-08-05 DIAGNOSIS — E785 Hyperlipidemia, unspecified: Secondary | ICD-10-CM | POA: Insufficient documentation

## 2015-08-05 DIAGNOSIS — F0390 Unspecified dementia without behavioral disturbance: Secondary | ICD-10-CM | POA: Diagnosis not present

## 2015-08-05 DIAGNOSIS — K219 Gastro-esophageal reflux disease without esophagitis: Secondary | ICD-10-CM | POA: Diagnosis not present

## 2015-08-05 DIAGNOSIS — Z7951 Long term (current) use of inhaled steroids: Secondary | ICD-10-CM | POA: Diagnosis not present

## 2015-08-05 DIAGNOSIS — G4730 Sleep apnea, unspecified: Secondary | ICD-10-CM | POA: Diagnosis not present

## 2015-08-05 DIAGNOSIS — R195 Other fecal abnormalities: Secondary | ICD-10-CM | POA: Diagnosis present

## 2015-08-05 DIAGNOSIS — R569 Unspecified convulsions: Secondary | ICD-10-CM | POA: Diagnosis not present

## 2015-08-05 DIAGNOSIS — K635 Polyp of colon: Secondary | ICD-10-CM | POA: Insufficient documentation

## 2015-08-05 DIAGNOSIS — I10 Essential (primary) hypertension: Secondary | ICD-10-CM | POA: Insufficient documentation

## 2015-08-05 DIAGNOSIS — J45909 Unspecified asthma, uncomplicated: Secondary | ICD-10-CM | POA: Diagnosis not present

## 2015-08-05 DIAGNOSIS — Z7902 Long term (current) use of antithrombotics/antiplatelets: Secondary | ICD-10-CM | POA: Diagnosis not present

## 2015-08-05 DIAGNOSIS — R197 Diarrhea, unspecified: Secondary | ICD-10-CM | POA: Insufficient documentation

## 2015-08-05 DIAGNOSIS — Z8673 Personal history of transient ischemic attack (TIA), and cerebral infarction without residual deficits: Secondary | ICD-10-CM | POA: Diagnosis not present

## 2015-08-05 HISTORY — PX: ESOPHAGOGASTRODUODENOSCOPY (EGD) WITH PROPOFOL: SHX5813

## 2015-08-05 HISTORY — PX: COLONOSCOPY WITH PROPOFOL: SHX5780

## 2015-08-05 SURGERY — ESOPHAGOGASTRODUODENOSCOPY (EGD) WITH PROPOFOL
Anesthesia: Monitor Anesthesia Care

## 2015-08-05 MED ORDER — LACTATED RINGERS IV SOLN
INTRAVENOUS | Status: DC
  Administered 2015-08-05: 1000 mL via INTRAVENOUS

## 2015-08-05 MED ORDER — PROPOFOL 10 MG/ML IV BOLUS
INTRAVENOUS | Status: AC
  Filled 2015-08-05: qty 20

## 2015-08-05 MED ORDER — PROPOFOL 10 MG/ML IV BOLUS
INTRAVENOUS | Status: AC
Start: 2015-08-05 — End: 2015-08-05
  Filled 2015-08-05: qty 20

## 2015-08-05 MED ORDER — LIDOCAINE HCL (CARDIAC) 20 MG/ML IV SOLN
INTRAVENOUS | Status: AC
Start: 2015-08-05 — End: 2015-08-05
  Filled 2015-08-05: qty 5

## 2015-08-05 MED ORDER — LIDOCAINE HCL (CARDIAC) 20 MG/ML IV SOLN
INTRAVENOUS | Status: DC | PRN
Start: 2015-08-05 — End: 2015-08-05
  Administered 2015-08-05: 100 mg via INTRAVENOUS

## 2015-08-05 MED ORDER — PROPOFOL 500 MG/50ML IV EMUL
INTRAVENOUS | Status: DC | PRN
  Administered 2015-08-05: 140 ug/kg/min via INTRAVENOUS

## 2015-08-05 MED ORDER — PROPOFOL 10 MG/ML IV BOLUS
INTRAVENOUS | Status: DC | PRN
  Administered 2015-08-05 (×2): 20 mg via INTRAVENOUS

## 2015-08-05 SURGICAL SUPPLY — 24 items

## 2015-08-05 NOTE — Anesthesia Procedure Notes (Signed)
Procedure Name: MAC Date/Time: 08/05/2015 9:48 AM Performed by: Carleene Cooper A Pre-anesthesia Checklist: Patient identified, Timeout performed, Emergency Drugs available, Suction available and Patient being monitored Patient Re-evaluated:Patient Re-evaluated prior to inductionOxygen Delivery Method: Nasal cannula Dental Injury: Teeth and Oropharynx as per pre-operative assessment

## 2015-08-05 NOTE — Telephone Encounter (Signed)
Patient may stop plavix for 5 days prior to the scheduled procedure and resume it after the procedure when safe, with a small but acceptable peri-operatiive procedure risk for TIA/stroke if patient is willing.         Rosalin Hawking, MD PhD    Stroke Neurology    08/03/2015    6:22 PM            ----- Message -----     From: Oda Kilts, CMA     Sent: 07/30/2015  1:40 PM      To: Rosalin Hawking, MD        Please advise

## 2015-08-05 NOTE — Discharge Instructions (Signed)

## 2015-08-05 NOTE — Anesthesia Preprocedure Evaluation (Addendum)
Anesthesia Evaluation  Patient identified by MRN, date of birth, ID band Patient awake    Reviewed: Allergy & Precautions, H&P , NPO status , Patient's Chart, lab work & pertinent test results  Airway Mallampati: II  TM Distance: >3 FB Neck ROM: full    Dental no notable dental hx. (+) Teeth Intact, Dental Advisory Given   Pulmonary neg pulmonary ROS, shortness of breath and with exertion, asthma , sleep apnea , COPD,  COPD inhaler,    Pulmonary exam normal breath sounds clear to auscultation       Cardiovascular Exercise Tolerance: Good hypertension, negative cardio ROS Normal cardiovascular exam Rhythm:regular Rate:Normal     Neuro/Psych Seizures -,  Anxiety Depression Dementia. Parkinsonism CVA, No Residual Symptoms negative neurological ROS  negative psych ROS   GI/Hepatic negative GI ROS, Neg liver ROS, GERD  Medicated and Controlled,  Endo/Other  negative endocrine ROS  Renal/GU negative Renal ROS  negative genitourinary   Musculoskeletal   Abdominal   Peds  Hematology negative hematology ROS (+)   Anesthesia Other Findings   Reproductive/Obstetrics negative OB ROS                            Anesthesia Physical Anesthesia Plan  ASA: III  Anesthesia Plan: MAC   Post-op Pain Management:    Induction:   Airway Management Planned: Nasal Cannula and Natural Airway  Additional Equipment:   Intra-op Plan:   Post-operative Plan:   Informed Consent: I have reviewed the patients History and Physical, chart, labs and discussed the procedure including the risks, benefits and alternatives for the proposed anesthesia with the patient or authorized representative who has indicated his/her understanding and acceptance.   Dental Advisory Given  Plan Discussed with: CRNA and Surgeon  Anesthesia Plan Comments:        Anesthesia Quick Evaluation

## 2015-08-05 NOTE — Anesthesia Postprocedure Evaluation (Signed)
  Anesthesia Post-op Note  Patient: Aimee Jones  Procedure(s) Performed: Procedure(s) (LRB): ESOPHAGOGASTRODUODENOSCOPY (EGD) WITH PROPOFOL (N/A) COLONOSCOPY WITH PROPOFOL (N/A)  Patient Location: PACU  Anesthesia Type: MAC  Level of Consciousness: awake and alert   Airway and Oxygen Therapy: Patient Spontanous Breathing  Post-op Pain: mild  Post-op Assessment: Post-op Vital signs reviewed, Patient's Cardiovascular Status Stable, Respiratory Function Stable, Patent Airway and No signs of Nausea or vomiting  Last Vitals:  Filed Vitals:   08/05/15 1050  BP: 143/86  Pulse: 77  Temp:   Resp: 17    Post-op Vital Signs: stable   Complications: No apparent anesthesia complications

## 2015-08-05 NOTE — Op Note (Signed)
[facility] [place], [address]   ENDOSCOPY PROCEDURE REPORT  PATIENT: Aimee Jones, Aimee Jones  MR#: [id number] BIRTHDATE: 12-15-1945 , 69  yrs. old GENDER: female ENDOSCOPIST: [name], MD PROCEDURE DATE:  08/05/2015 PROCEDURE:  EGD, diagnostic ASA CLASS:     Class III INDICATIONS:  nausea, vomiting. MEDICATIONS: Monitored anesthesia care TOPICAL ANESTHETIC: none  DESCRIPTION OF PROCEDURE: After the risks benefits and alternatives of the procedure were thoroughly explained, informed consent was obtained.  The [place] [id number] endoscope was introduced through the mouth and advanced to the second portion of the duodenum , Without limitations.  The instrument was slowly withdrawn as the mucosa was fully examined.  EXAM: The esophagus and gastroesophageal junction were completely normal in appearance.  The stomach was entered and closely examined.The antrum, angularis, and lesser curvature were well visualized, including a retroflexed view of the cardia and fundus. The stomach wall was normally distensable.  The scope passed easily through the pylorus into the duodenum.  Retroflexed views revealed no abnormalities.     The scope was then withdrawn from the patient and the procedure completed. COMPLICATIONS: There were no immediate complications.  ENDOSCOPIC IMPRESSION: Normal appearing esophagus and GE junction, the stomach was well visualized and normal in appearance, normal appearing duodenum  RECOMMENDATIONS: Await final path result from colonoscopy for further recommendations.   eSigned:  [name], MD 08/05/2015 10:25 AM    CC: [name], MD

## 2015-08-05 NOTE — H&P (View-Only) (Signed)
Review of pertinent gastrointestinal problems: 1. FOBT positive stool, 06/2015 when in hosp 2. Diarrhea: started 06/2015: labs 07/2015: C. Diff + by PCR, routine stool culture neg, ova and parasites neg; CBC with slightly elevated WBC, not anemic.  Was started on flagyl 521m tid on 10/5.   HPI: This is a  very pleasant 69year old woman whom I last saw about 10 days ago here in the office. She is with her husband again today.  She started Flagyl for PCR positive C. difficile associated diarrhea 1 week ago. She has not really noticed any improvement in her diarrhea since then. Her husband reports she goes several times during the night and multiple times throughout the day. She is not having fevers or chills. She has not had significant abdominal pain. Her nausea which has been chronic may be worse now that she start the Flagyl. She has had a bit of vomiting as well.  Chief complaint is  diarrhea   Past Medical History  Diagnosis Date  . Hyperlipidemia   . ETOH abuse   . Allergy   . Diverticulosis   . Hypertension   . Depression   . Asthma   . GERD (gastroesophageal reflux disease)   . Normal 24 hour ambulatory pH monitoring study     good acid suppression  . Dementia   . Shortness of breath   . Anxiety     panic attacks- on occas.   . Arthritis     osteoporosis - especially hips.   . [place]   . Memory loss   . Headache   . Confusion   . Hyponatremia 07/05/2015  . [facility]    observed by husband , Dr rDoy Mince   Past Surgical History  Procedure Laterality Date  . Dobutamine stress echo  07/09/2001    normal  . Esophagogastroduodenoscopy  06/11/2006    normal  . Abdominal hysterectomy    . Total hip arthroplasty      left 1991/right 2008  . Nasal sinus surgery  1990  . Eye surgery      cataracts removed, IOL- both eyes   . Breast surgery      3x- biposies   . Shoulder arthroscopy Left   . Reverse shoulder arthroplasty  Right 08/13/2014    Procedure: REVERSE SHOULDER ARTHROPLASTY;  Surgeon: KMarin Shutter MD;  [facility]  Service: Orthopedics;  Laterality: Right;  interscalene block  . Joint replacement      bilateral hips  . Loop recorder implant N/A 10/14/2014    Procedure: LOOP RECORDER IMPLANT;  Surgeon: GEvans Lance MD;  [facility];  Service: Cardiovascular;  Laterality: N/A;  . Tee without cardioversion N/A 10/14/2014    Procedure: TRANSESOPHAGEAL ECHOCARDIOGRAM (TEE);  Surgeon: TSueanne Margarita MD;  [facility];  Service: Cardiovascular;  Laterality: N/A;    Current Outpatient Prescriptions  Medication Sig Dispense Refill  . acetaminophen (TYLENOL) 500 MG tablet Take 1-2 tablets (500-1,000 mg total) by mouth every 6 (six) hours as needed for mild pain. 30 tablet   . amLODipine (NORVASC) 2.5 MG tablet Take 1 tablet (2.5 mg total) by mouth daily. 30 tablet 0  . atorvastatin (LIPITOR) 10 MG tablet Take 10 mg by mouth daily at 6 PM.     . azelastine (ASTELIN) 0.1 % nasal spray Place 1 spray into both nostrils 2 (two) times daily. Use in each nostril as directed    . b complex vitamins tablet Take  1 tablet by mouth daily.    . barrier cream (NON-SPECIFIED) CREA Apply 1 application topically 4 (four) times daily as needed (for periarea).    . budesonide-formoterol (SYMBICORT) 160-4.5 MCG/ACT inhaler Inhale 2 puffs into the lungs 2 (two) times daily as needed (shortness of breath). 1 Inhaler 12  . clopidogrel (PLAVIX) 75 MG tablet Take 1 tablet (75 mg total) by mouth daily. 90 tablet 3  . donepezil (ARICEPT) 10 MG tablet TAKE 1 TABLET BY MOUTH DAILY AT BEDTIME 90 tablet 1  . ferrous sulfate 325 (65 FE) MG tablet Take 1 tablet (325 mg total) by mouth 2 (two) times daily with a meal. (Patient taking differently: Take 325 mg by mouth daily with breakfast. ) 90 tablet 0  . fluconazole (DIFLUCAN) 150 MG tablet Take 1 tablet (150 mg total) by mouth once. (Patient taking differently:  Take 150 mg by mouth once. As need for infection) 1 tablet 0  . fluticasone (FLONASE) 50 MCG/ACT nasal spray Place 1-2 sprays into both nostrils daily. (Patient taking differently: Place 1-2 sprays into both nostrils daily as needed for allergies. ) 16 g 6  . folic acid (FOLVITE) 1 MG tablet Take 1 mg by mouth daily.    . hydrocortisone (ANUSOL-HC) 2.5 % rectal cream Apply rectally 2 times daily 30 g 0  . metroNIDAZOLE (FLAGYL) 500 MG tablet Take 1 tablet (500 mg total) by mouth 3 (three) times daily. 42 tablet 0  . montelukast (SINGULAIR) 10 MG tablet Take 10 mg by mouth at bedtime.     Marland Kitchen MOVIPREP 100 G SOLR Take 1 kit (200 g total) by mouth once. 1 kit 0  . Multiple Vitamin (MULTIVITAMIN WITH MINERALS) TABS tablet Take 1 tablet by mouth every evening.    . nystatin cream (MYCOSTATIN) Apply 1 application topically 2 (two) times daily.    . Omega 3 1000 MG CAPS Take 1,000 mg by mouth every evening.    Marland Kitchen omeprazole (PRILOSEC) 20 MG capsule TAKE 1 CAPSULE TWICE DAILY FOR 10 DAYS, THEN DECREASE TO NORMAL ONCE DAILY DOSING 30 capsule 5  . PRESCRIPTION MEDICATION Supportive therapy [facility]    . promethazine (PHENERGAN) 25 MG tablet TAKE 1 TABLET BY MOUTH EVERY 6 HOURS AS NEEDED FOR NAUSEA 20 tablet 0  . saccharomyces boulardii (FLORASTOR) 250 MG capsule Take 1 capsule (250 mg total) by mouth 2 (two) times daily. 60 capsule 0  . thiamine 100 MG tablet Take 100 mg by mouth daily.    . vitamin E 400 UNIT capsule Take 400 Units by mouth daily.     No current facility-administered medications for this visit.    Allergies as of 07/29/2015 - Review Complete 07/29/2015  Allergen Reaction Noted  . Latex Rash 07/25/2007  . Penicillins Rash 07/25/2007    Family History  Problem Relation Age of Onset  . Sudden death Brother   . Heart attack Brother   . Hypertension Brother   . Esophageal cancer Brother   . Heart attack Father   . Heart block Mother   . Stroke Brother   . Stroke Mother     Social  History   Social History  . Marital Status: Married    Spouse Name: N/A  . Number of Children: N/A  . Years of Education: N/A   Occupational History  . retired     [employer]   Social History Main Topics  . Smoking status: Never Smoker   . Smokeless tobacco: Never Used  . Alcohol Use:  0.0 oz/week    0 Standard drinks or equivalent per week     Comment: grand Mariener - almost daily- 1/2 shot             07/05/2015 no alcohol use at this time   . Drug Use: No  . Sexual Activity: Yes   Other Topics Concern  . Not on file   Social History Narrative   Married. Retired.       Physical Exam: Pulse 70  Ht _0  (1.702 m)  Wt 165 lb (74.844 kg)  BMI 25.84 kg/m2 Constitutional: Mildly demented but does seem to be aware of what's going on Psychiatric: alert and oriented x3 Abdomen: soft, nontender, nondistended, no obvious ascites, no peritoneal signs, normal bowel sounds   Assessment and plan: 69 y.o. female with diarrhea, PCR positive for C. difficile  She is failing to respond to Flagyl and is probably having some side effects from the Flagyl with worsening of her nausea, vomiting at times. I'm going to change her to by mouth vancomycin 125 mg 4 times daily for 2 weeks. I recommended that they give her an Imodium at bedtime every night. She does not at all seem to be at risk for megacolon think it would probably help her get some rest overnight. She will stop Flagyl. I had originally planned to delay her colonoscopy to workup her fecal occult positive stool until after we can get this diarrhea under control but she doesn't seem to be responding to usual treatments for Clostridium difficile and I'm wondering if it is a false positive PCR result. We'll put her back on for colonoscopy as well as upper endoscopy next week at [facility]. She has previously been okay to hold her Plavix for 5 days and we will do that again for her for this.   Owens Loffler, MD [place]  [facility] 07/29/2015, 2:46 PM

## 2015-08-05 NOTE — Interval H&P Note (Signed)
History and Physical Interval Note:  08/05/2015 9:14 AM  Aimee Jones  has presented today for surgery, with the diagnosis of diarrhea, nausea and vomiting  The various methods of treatment have been discussed with the patient and family. After consideration of risks, benefits and other options for treatment, the patient has consented to  Procedure(s): ESOPHAGOGASTRODUODENOSCOPY (EGD) WITH PROPOFOL (N/A) COLONOSCOPY WITH PROPOFOL (N/A) as a surgical intervention .  The patient's history has been reviewed, patient examined, no change in status, stable for surgery.  I have reviewed the patient's chart and labs.  Questions were answered to the patient's satisfaction.     [name]

## 2015-08-05 NOTE — Transfer of Care (Signed)
Immediate Anesthesia Transfer of Care Note  Patient: Aimee Jones  Procedure(s) Performed: Procedure(s): ESOPHAGOGASTRODUODENOSCOPY (EGD) WITH PROPOFOL (N/A) COLONOSCOPY WITH PROPOFOL (N/A)  Patient Location: PACU and Endoscopy Unit  Anesthesia Type:MAC  Level of Consciousness: awake, alert , oriented and patient cooperative  Airway & Oxygen Therapy: Patient Spontanous Breathing and Patient connected to nasal cannula oxygen  Post-op Assessment: Report given to RN, Post -op Vital signs reviewed and stable and Patient moving all extremities  Post vital signs: Reviewed and stable  Last Vitals:  Filed Vitals:   08/05/15 0920  BP: 156/85  Pulse: 83  Temp: 36.5 C  Resp: 16    Complications: No apparent anesthesia complications

## 2015-08-05 NOTE — Op Note (Signed)
[facility] [place], [address]   COLONOSCOPY PROCEDURE REPORT  PATIENT: Aimee Jones, Aimee Jones  MR#: [id number] BIRTHDATE: October 14, 1946 , 29  yrs. old GENDER: female ENDOSCOPIST: [name], MD PROCEDURE DATE:  08/05/2015 PROCEDURE:   Colonoscopy, diagnostic and Colonoscopy with biopsy First Screening Colonoscopy - Avg.  risk and is 50 yrs.  old or older - No.  Prior Negative Screening - Now for repeat screening. N/A  History of Adenoma - Now for follow-up colonoscopy & has been > or = to 3 yrs.  N/A  Recommend repeat exam, <10 yrs? No ASA CLASS:   Class III INDICATIONS:diarrhea. MEDICATIONS: Monitored anesthesia care  DESCRIPTION OF PROCEDURE:   After the risks benefits and alternatives of the procedure were thoroughly explained, informed consent was obtained.  The digital rectal exam revealed no abnormalities of the rectum.   The Pentax Ped Colon [id number] endoscope was introduced through the anus and advanced to the terminal ileum which was intubated for a short distance. No adverse events experienced.   The quality of the prep was excellent.  The instrument was then slowly withdrawn as the colon was fully examined. Estimated blood loss is zero unless otherwise noted in this procedure report.   COLON FINDINGS: The terminal ileum was normal.  The colonic mucosa was normal.  I biopsied the colon randomly and sent to pathology. There were 6-7 small sessile polyps (2-18mm across) that I did not remove given her overall clinical situation, multiple co-morbidities.  The examination was otherwise normal.  Retroflexed views revealed no abnormalities. The time to cecum = 2.9 Withdrawal time = 12.0   The scope was withdrawn and the procedure completed. COMPLICATIONS: There were no immediate complications.  ENDOSCOPIC IMPRESSION: The terminal ileum was normal.  The colonic mucosa was normal.  I biopsied the colon randomly and sent to pathology.  There  were 6-7 small sessile polyps (2-9mm across) that I did not remove given her overall clinical situation, multiple co-morbidities.  The examination was otherwise normal  RECOMMENDATIONS: Complete the vancomycin course.  Please take imodium 2 pills every morning and 2 pills every night.  Await pathology results for further recommendations.  eSigned:  [name], MD 08/05/2015 10:21 AM   cc: [name], MD

## 2015-08-05 NOTE — Telephone Encounter (Signed)
Left message on machine to call back  

## 2015-08-06 ENCOUNTER — Encounter (HOSPITAL_COMMUNITY): Payer: Self-pay | Admitting: Gastroenterology

## 2015-08-06 ENCOUNTER — Other Ambulatory Visit: Payer: Self-pay | Admitting: Family Medicine

## 2015-08-06 DIAGNOSIS — J3081 Allergic rhinitis due to animal (cat) (dog) hair and dander: Secondary | ICD-10-CM | POA: Diagnosis not present

## 2015-08-06 DIAGNOSIS — J3089 Other allergic rhinitis: Secondary | ICD-10-CM | POA: Diagnosis not present

## 2015-08-10 DIAGNOSIS — J3081 Allergic rhinitis due to animal (cat) (dog) hair and dander: Secondary | ICD-10-CM | POA: Diagnosis not present

## 2015-08-10 DIAGNOSIS — J3089 Other allergic rhinitis: Secondary | ICD-10-CM | POA: Diagnosis not present

## 2015-08-10 NOTE — Telephone Encounter (Signed)
I spoke with the pt husband and he picked up the vanc and paid the full price for the tabs, he states he was worried about her and just paid the money and will call back if her symptoms return,  She does feel much better

## 2015-08-11 ENCOUNTER — Ambulatory Visit (INDEPENDENT_AMBULATORY_CARE_PROVIDER_SITE_OTHER): Payer: Medicare Other | Admitting: *Deleted

## 2015-08-11 DIAGNOSIS — I6322 Cerebral infarction due to unspecified occlusion or stenosis of basilar arteries: Secondary | ICD-10-CM | POA: Diagnosis not present

## 2015-08-12 NOTE — Progress Notes (Signed)
Loop recorder 

## 2015-08-15 ENCOUNTER — Other Ambulatory Visit: Payer: Self-pay | Admitting: Family Medicine

## 2015-08-15 ENCOUNTER — Telehealth: Payer: Self-pay | Admitting: Family Medicine

## 2015-08-16 DIAGNOSIS — G894 Chronic pain syndrome: Secondary | ICD-10-CM | POA: Diagnosis not present

## 2015-08-16 DIAGNOSIS — Z79899 Other long term (current) drug therapy: Secondary | ICD-10-CM | POA: Diagnosis not present

## 2015-08-16 DIAGNOSIS — M47812 Spondylosis without myelopathy or radiculopathy, cervical region: Secondary | ICD-10-CM | POA: Diagnosis not present

## 2015-08-16 NOTE — Telephone Encounter (Signed)
Call pt's daughter about this rx.  Unless she can verify that the patient needs this med, then we should decline it.  Thanks.

## 2015-08-16 NOTE — Telephone Encounter (Signed)
Received refill request electronically Last refill 07/12/15 #20 Last office visit 07/13/15 Is it okay to refill?

## 2015-08-17 DIAGNOSIS — J3081 Allergic rhinitis due to animal (cat) (dog) hair and dander: Secondary | ICD-10-CM | POA: Diagnosis not present

## 2015-08-17 DIAGNOSIS — J3089 Other allergic rhinitis: Secondary | ICD-10-CM | POA: Diagnosis not present

## 2015-08-17 NOTE — Telephone Encounter (Signed)
Call pt's daughter about this rx. Unless she can verify that the patient needs this med, then we should decline it. Thanks.

## 2015-08-17 NOTE — Telephone Encounter (Addendum)
Thanks. Noted. I removed it from the med list.

## 2015-08-17 NOTE — Telephone Encounter (Signed)
Daughter Seth Bake) states she sees no reason why she needs this medication and asked that the Rx be declined.

## 2015-08-17 NOTE — Addendum Note (Signed)
Addended by: Tonia Ghent on: 08/17/2015 09:53 AM   Modules accepted: Orders, Medications

## 2015-08-23 ENCOUNTER — Ambulatory Visit: Payer: Medicare Other | Admitting: Gastroenterology

## 2015-08-23 ENCOUNTER — Other Ambulatory Visit: Payer: Self-pay | Admitting: Family Medicine

## 2015-08-23 NOTE — Telephone Encounter (Signed)
Mr Rothenberger left v/m requesting refill amlodipine; spoke with [facility] and rx on hold; Melonie ran thru and rx ready for pick up. Left v/m for Mr Lowrimore to cb.

## 2015-08-24 ENCOUNTER — Encounter: Payer: Self-pay | Admitting: Neurology

## 2015-08-24 ENCOUNTER — Ambulatory Visit (INDEPENDENT_AMBULATORY_CARE_PROVIDER_SITE_OTHER): Payer: Medicare Other | Admitting: Neurology

## 2015-08-24 VITALS — BP 110/76 | HR 94 | Ht 67.0 in | Wt 167.0 lb

## 2015-08-24 DIAGNOSIS — R569 Unspecified convulsions: Secondary | ICD-10-CM | POA: Diagnosis not present

## 2015-08-24 DIAGNOSIS — G894 Chronic pain syndrome: Secondary | ICD-10-CM

## 2015-08-24 DIAGNOSIS — D509 Iron deficiency anemia, unspecified: Secondary | ICD-10-CM

## 2015-08-24 DIAGNOSIS — F1097 Alcohol use, unspecified with alcohol-induced persisting dementia: Secondary | ICD-10-CM

## 2015-08-24 DIAGNOSIS — I6322 Cerebral infarction due to unspecified occlusion or stenosis of basilar arteries: Secondary | ICD-10-CM | POA: Diagnosis not present

## 2015-08-24 DIAGNOSIS — J3089 Other allergic rhinitis: Secondary | ICD-10-CM | POA: Diagnosis not present

## 2015-08-24 DIAGNOSIS — F1027 Alcohol dependence with alcohol-induced persisting dementia: Secondary | ICD-10-CM

## 2015-08-24 DIAGNOSIS — J3081 Allergic rhinitis due to animal (cat) (dog) hair and dander: Secondary | ICD-10-CM | POA: Diagnosis not present

## 2015-08-24 NOTE — Progress Notes (Signed)
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  The patient is accompanied by her husband who supplements the history.  The patient reports that she first noted some tremor in October at her PE when her L arm was lifted above the head.  She denies tremor otherwise but her husband noted tremor for a year when she went to pick up coffee.  She has been on abilify for depression for a few years.  She has not been on other atypical agents.  04/15/2013 update:  I did have the opportunity to review records since she was last here.  The patient needs a shoulder surgery and has been seen by the orthopedic physician, but he did not want to do the surgery until the patient discontinued her alcohol use.  She did end up having her surgery done on June 11.  She is now in physical therapy.  The patient does have a history of alcohol abuse.  She had stopped drinking prior to her orthopedic shoulder surgery on the left, but her husband states that she has started drinking again.  He states that her balance got markedly better when she stopped drinking.  The patient states that she is only drinking one drink per day now.  The patient's Abilify was discontinued last November, and her husband noticed an improvement in tremor.  05/22/14 update: Pt has a hx of parkinsonism, that had resolved off of abilify.  She has not been seen in over a year.  At last visit, I had noted memory loss and recommended that she not drive unless she have and pass a medical driving evaluation.  Her husband states that memory has dramatically deteriorated.  She came to her husband recently with a wrapper around cheese and tried to put it around candy and was just confused.  On her way in today, she was bending over in the parking lot picking up trash below cars.  He tried to get her into a [facility] alzheimers study but she was turned down because of past EtOH abuse and  narcotic meds.  She is still drinking but her husband states that he controls it completely.  He gives her 4 oz per day of liquor.  He states that he just isn't sure that he wants to "deny her a little pleasure."  No falls since her husband took over distrubution of the EtOH.   Rare visual and auditory hallucinations.  Last driving was 3 weeks ago but her husband doesn't plan on having her drive any longer.  She wears depends for 3 years.  Her husband has a wood working shop next to the house but he is starting to think that he needs to turn off the breaker to the stove when he leaves.  She takes 2 xanax at night for sleep.  She has doxepin but doesn't take it.  She does take 4 ibuprofen in the AM and 2 tylenol at night for shoulder pain and one time per week will take 7.5 mg of hydrocodone.  No longer on thiamine.    03/22/15 update:  The patient returns today for follow-up.  I have not seen her since August of last year.  She did have neuropsych testing last September, 2015 and Dr. 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  She is still drinking 4 nights a week (husband prepares it and mixes liquor with water).  She did have right shoulder  replacement since last visit and she was doing well but now the sx's are coming back.  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  She is to have an injection next week for that pain.  She was hospitalized in December, 2015.  I reviewed his records.  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  She had an echocardiogram that did not reveal any source of emboli.  Her ejection fraction was 55-60%.  She had a normal carotid ultrasound.  Lower extremity Dopplers were negative.  She underwent a TEE on 10/14/2014 and a small PFO was identified.  She had a loop recorder implanted that same day.  She has followed up several times with  [facility] neurology.  Dr. Jannifer Franklin did an EMG on the patient.  She just saw [facility] neurology a few days ago.  Her husband, however, states that she was told to follow-up here.  She is on Aricept for her memory loss. Her husband states that he has always given it to her in the AM(been on it since at least October last year) but about 3 weeks ago he changed it to q hs as he realized that the bottle said it was to be given at night.  However, since that time she seems a bit more confused.  Her husband also states that pt c/o low lying daily headache.  He asks if it is related to the fact that she is taking oxycodone 3-4 times per day.  Headache is holocephalic.     08/24/15 update:  The patient is following up today, accompanied by her husband who supplements the history.  Much has happened since our last visit and I reviewed her extensive records and hospital stays.  She has a history of alcohol related dementia and last time I talked to them again about the importance of discontinuing all alcohol and not just distributing alcohol to her, as well as previously being done by her husband.  I also talked to them about the fact that Xanax, oxycodone, fentanyl and doxepin could all contribute to her cognitive change.  She ended up getting admitted to the hospital from September 6 to September 13 after she fell and it was determined that her sodium was only 112.  She had coffee ground emesis and it was felt that this is likely from her alcohol abuse.  She was discharged to a subacute nursing facility.  Only 5 days later she returned with a seizure.  She was admitted at [facility] for this from September 18 to 07/07/2015.  The seizure lasted between 2 and 4 minutes.  Her head turned to the left before she shook all over.  She did not bite her tongue or lose bladder control.  The seizure was felt to be multifactorial, as her sodium was still low, she had been placed back on tramadol and had been off of her Xanax since  06/23/2015.  In addition, she was in a nursing facility and likely was not getting alcohol like she previously had been.  She had the EEG on September 20 that was normal.  I also reviewed her MRI of the brain with and without gadolinium done on 07/04/2015 and this was nonacute.  There was atrophy and moderate small vessel disease.  She returned again to the emergency room on September 22 because of melena.  She followed up with gastroenterology because of  this and because of diarrhea.  Her PCR was positive for C. difficile.  She was already on Flagyl, so this was discontinued and she was changed to vancomycin.  Last visit, I did lab work and noticed that she had thrombocytosis and leukocytosis.  She ended up getting a hematology consult and it was felt that this was likely reactive to iron deficiency anemia.  She is on iron supplements.  She is back at home now.  Her husband reports that he has a locking briefcase and he distributes meds to her.  Her husband states that she is no longer drinking alcohol.  Husband states that was off narcotics but then states that she is on butrans patch.  He doesn't think that it is helping so he took it off today.  She has f/u with pain management on Monday (Dr. Andree Elk).  Her husband is bathing her "because of her chronic pain."  She is back to fully dressing herself.     PREVIOUS MEDICATIONS: none to date   ALLERGIES:   Allergies  Allergen Reactions  . Latex Rash  . Penicillins Rash    .Marland KitchenHas patient had a PCN reaction causing immediate rash, facial/tongue/throat swelling, SOB or lightheadedness with hypotension: No Has patient had a PCN reaction causing severe rash involving mucus membranes or skin necrosis: No Has patient had a PCN reaction that required hospitalization No Has patient had a PCN reaction occurring within the last 10 years: No If all of the above answers are "NO", then may proceed with Cephalosporin use.      CURRENT MEDICATIONS:  Current  Outpatient Prescriptions on File Prior to Visit  Medication Sig Dispense Refill  . acetaminophen (TYLENOL) 500 MG tablet Take 1-2 tablets (500-1,000 mg total) by mouth every 6 (six) hours as needed for mild pain. 30 tablet   . atorvastatin (LIPITOR) 10 MG tablet Take 10 mg by mouth daily at 6 PM.     . clopidogrel (PLAVIX) 75 MG tablet Take 1 tablet (75 mg total) by mouth daily. 90 tablet 3  . donepezil (ARICEPT) 10 MG tablet TAKE 1 TABLET BY MOUTH DAILY AT BEDTIME 90 tablet 1  . ferrous sulfate 325 (65 FE) MG tablet TAKE 1 TABLET BY MOUTH 2 TIMES DAILY WITH A MEAL 90 tablet 0  . fluticasone (FLONASE) 50 MCG/ACT nasal spray Place 1-2 sprays into both nostrils daily. (Patient taking differently: Place 1-2 sprays into both nostrils daily as needed for allergies. ) 16 g 6  . hydrocortisone (ANUSOL-HC) 2.5 % rectal cream Apply rectally 2 times daily (Patient taking differently: Place 1 application rectally 2 (two) times daily as needed for hemorrhoids. ) 30 g 0  . montelukast (SINGULAIR) 10 MG tablet Take 10 mg by mouth at bedtime.     Marland Kitchen omeprazole (PRILOSEC) 20 MG capsule TAKE 1 CAPSULE TWICE DAILY FOR 10 DAYS, THEN DECREASE TO NORMAL ONCE DAILY DOSING (Patient taking differently: Take one tablet by mouth nightly.) 30 capsule 5  . polyvinyl alcohol (LIQUIFILM TEARS) 1.4 % ophthalmic solution Place 1 drop into both eyes daily as needed for dry eyes (allergies).    Marland Kitchen PRESCRIPTION MEDICATION Inject 1 each into the skin. Supportive therapy [facility] [facility]-infusion    . SYMBICORT 160-4.5 MCG/ACT inhaler INHALE 2 PUFFS INTO THE LUNGS 2 (TWO) TIMES DAILY AS NEEDED (SHORTNESS OF BREATH). 10.2 Inhaler 7  . amLODipine (NORVASC) 2.5 MG tablet TAKE 1 TABLET BY MOUTH DAILY (Patient not taking: Reported on 08/24/2015) 30 tablet 0  . vitamin E 400  UNIT capsule Take 400 Units by mouth daily.     No current facility-administered medications on file prior to visit.    PAST MEDICAL HISTORY:   Past Medical History  Diagnosis  Date  . Hyperlipidemia   . ETOH abuse   . Allergy   . Diverticulosis   . Hypertension   . Depression   . Asthma   . GERD (gastroesophageal reflux disease)   . Normal 24 hour ambulatory pH monitoring study     good acid suppression  . Dementia   . Shortness of breath   . Anxiety     panic attacks- on occas.   . Arthritis     osteoporosis - especially hips.   . Stroke ([place])   . Memory loss   . Headache   . Confusion   . Hyponatremia 07/05/2015  . Observed seizure-like activity ([place]) 07/05/2015    observed by husband , Dr Doy Mince    PAST SURGICAL HISTORY:   Past Surgical History  Procedure Laterality Date  . Dobutamine stress echo  07/09/2001    normal  . Esophagogastroduodenoscopy  06/11/2006    normal  . Abdominal hysterectomy    . Total hip arthroplasty      left 1991/right 2008  . Nasal sinus surgery  1990  . Eye surgery      cataracts removed, IOL- both eyes   . Breast surgery      3x- biposies   . Shoulder arthroscopy Left   . Reverse shoulder arthroplasty Right 08/13/2014    Procedure: REVERSE SHOULDER ARTHROPLASTY;  Surgeon: Marin Shutter, MD;  Location: [facility];  Service: Orthopedics;  Laterality: Right;  interscalene block  . Joint replacement      bilateral hips  . Loop recorder implant N/A 10/14/2014    Procedure: LOOP RECORDER IMPLANT;  Surgeon: Evans Lance, MD;  Location[facility] C[facility] LAB;  Service: Cardiovascular;  Laterality: N/A;  . Tee without cardioversion N/A 10/14/2014    Procedure: TRANSESOPHAGEAL ECHOCARDIOGRAM (TEE);  Surgeon: Sueanne Margarita, MD;  Location: [facility] ENDOSCOPY;  Service: Cardiovascular;  Laterality: N/A;  . Esophagogastroduodenoscopy (egd) with propofol N/A 08/05/2015    Procedure: ESOPHAGOGASTRODUODENOSCOPY (EGD) WITH PROPOFOL;  Surgeon: [name], MD;  Location: [facility] ENDOSCOPY;  Service: Endoscopy;  Laterality: N/A;  . Colonoscopy with propofol N/A 08/05/2015    Procedure: COLONOSCOPY WITH PROPOFOL;  Surgeon: [name], MD;   Location: [facility] ENDOSCOPY;  Service: Endoscopy;  Laterality: N/A;    SOCIAL HISTORY:   Social History   Social History  . Marital Status: Married    Spouse Name: N/A  . Number of Children: N/A  . Years of Education: N/A   Occupational History  . retired     [employer]   Social History Main Topics  . Smoking status: Never Smoker   . Smokeless tobacco: Never Used  . Alcohol Use: 0.0 oz/week    0 Standard drinks or equivalent per week     Comment: grand Mariener - almost daily- 1/2 shot             07/05/2015 no alcohol use at this time   . Drug Use: No  . Sexual Activity: Yes   Other Topics Concern  . Not on file   Social History Narrative   Married. Retired.      FAMILY HISTORY:   Family Status  Relation Status Death Age  . Mother Deceased     CAD, PPM  . Father Deceased  CAD  . Sister Alive     3, healthy  . Brother Alive     2, CAD, esophageal CA  . Child Alive     healthy    ROS:  A complete 10 system review of systems was obtained and was unremarkable apart from what is mentioned above.  PHYSICAL EXAMINATION:    VITALS:   Filed Vitals:   08/24/15 1407  BP: 110/76  Pulse: 94  Height: 5\' 7"  (1.702 m)  Weight: 167 lb (75.751 kg)    GEN:  The patient appears stated age and is in NAD. HEENT:  Normocephalic, atraumatic.  The mucous membranes are moist. The superficial temporal arteries are without ropiness or tenderness. CV:  RRR Lungs:  CTAB Neck/HEME:  There are no carotid bruits bilaterally.  Neurological examination:  Orientation: Scores 3/4 on clock drawing.  She says that it is May, 2016.  Previous MoCA scores were 7 and 17.   Cranial nerves: There is good facial symmetry. Pupils are equal round and reactive to light bilaterally. Fundoscopic exam reveals clear margins bilaterally. Extraocular muscles are intact. The visual fields are full to confrontational testing. The speech is fluent and clear. Soft palate rises symmetrically and there is no  tongue deviation. Hearing is intact to conversational tone. Sensation: Sensation is intact to light touch throughout. Motor: Strength is 5/5 in the bilateral upper and lower extremities.   Shoulder shrug is equal and symmetric.  There is no pronator drift. Frontal release:  There is a bilateral palmomental response  Movement examination: Tone: There is normal tone in the bilateral upper extremities.  The tone in the lower extremities is normal.  Abnormal movements: There was no tremor today.    No dyskinesia.  No asterixis. Coordination:  There is no difficulty with rapid alternating movements today. Gait and Station: The patient has no difficulty arising out of a deep-seated chair without the use of the hands. The patient's stride length is normal. Arm swing is good.    LABS:  Lab Results  Component Value Date   VITAMINB12 1206* 06/24/2015   Lab Results  Component Value Date   FOLATE 19.5 06/24/2015   No results found for: RPR   Chemistry      Component Value Date/Time   NA 138 07/29/2015 1043   K 3.7 07/29/2015 1043   CL 105 07/29/2015 1043   CO2 27 07/29/2015 1043   BUN 10 07/29/2015 1043   CREATININE 0.85 07/29/2015 1043   CREATININE 1.18* 05/22/2014 1211      Component Value Date/Time   CALCIUM 9.6 07/29/2015 1043   ALKPHOS 77 07/19/2015 1048   AST 17 07/19/2015 1048   ALT 15 07/19/2015 1048   BILITOT 0.4 07/19/2015 1048     Lab Results  Component Value Date   WBC 12.4 Repeated and verified X2.* 07/19/2015   HGB 13.1 07/19/2015   HCT 41.2 07/19/2015   MCV 81.2 07/19/2015   PLT 691.0 Repeated and verified X2.* 07/19/2015     ASSESSMENT/PLAN  1.  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  I do not think that she has idiopathic Parkinson's disease.  -This has resolved off of the Abilify.  Does have some chin tremor and hand tremor but still no evidence of PD.  Husband states that daughter asks about Lewy Body dementia but I explained that she does not have  that. 2.  EtOH dementia  -Is stable.  Now not drinking at all.  Congratulated her  -continue Aricept.  Discussed  safety in detail with patient and husband 3.  Seizure, 06/2015  -multifactorial, xanax withdrawal and EtOH w/d (was in SNF at time), addition of tramadol as well as hyponatremia felt to be factors.  EEG normal.  No meds indicated.  Seizure/safety discussed 4.  Chronic pain  -need to be careful with narcotic meds.  Off of oxycodone but recently given butrans patch.  Doesn't think that it is helping and returning to pain management in few days.  Would like to try to avoid opiod meds.  No tramadol due to seizure.  -likely from the narcotic medication usage.  Takes oxycodone 3-4 times daily.   5.  Iron deficiency anemia with reactive thrombocytosis  -on iron supplement 6.  They are going to f/u with PCP per husband but will let me know if needed in future.  Much greater than 50% of this visit was spent in counseling with the patient and the family.  Total face to face time:  30 min

## 2015-08-26 ENCOUNTER — Telehealth: Payer: Self-pay | Admitting: *Deleted

## 2015-08-26 ENCOUNTER — Ambulatory Visit (HOSPITAL_COMMUNITY): Admission: RE | Admit: 2015-08-26 | Payer: Medicare Other | Source: Ambulatory Visit | Admitting: Gastroenterology

## 2015-08-26 DIAGNOSIS — J3081 Allergic rhinitis due to animal (cat) (dog) hair and dander: Secondary | ICD-10-CM | POA: Diagnosis not present

## 2015-08-26 SURGERY — COLONOSCOPY
Anesthesia: Monitor Anesthesia Care

## 2015-08-26 NOTE — Telephone Encounter (Signed)
Spoke w/ pt's husband. He does not recall her mentioning anything on 08/21/15. Spouse agreed to discuss timeframe w/ pt. He stated pt does have memory issues. If she recalls any adverse advents that day, he will call back.

## 2015-08-30 DIAGNOSIS — G894 Chronic pain syndrome: Secondary | ICD-10-CM | POA: Diagnosis not present

## 2015-08-30 DIAGNOSIS — Z79891 Long term (current) use of opiate analgesic: Secondary | ICD-10-CM | POA: Diagnosis not present

## 2015-08-30 DIAGNOSIS — M542 Cervicalgia: Secondary | ICD-10-CM | POA: Diagnosis not present

## 2015-08-30 DIAGNOSIS — M47812 Spondylosis without myelopathy or radiculopathy, cervical region: Secondary | ICD-10-CM | POA: Diagnosis not present

## 2015-08-30 DIAGNOSIS — M5030 Other cervical disc degeneration, unspecified cervical region: Secondary | ICD-10-CM | POA: Diagnosis not present

## 2015-08-30 DIAGNOSIS — Z79899 Other long term (current) drug therapy: Secondary | ICD-10-CM | POA: Diagnosis not present

## 2015-09-02 ENCOUNTER — Telehealth: Payer: Self-pay | Admitting: Gastroenterology

## 2015-09-02 DIAGNOSIS — J3089 Other allergic rhinitis: Secondary | ICD-10-CM | POA: Diagnosis not present

## 2015-09-02 DIAGNOSIS — J4530 Mild persistent asthma, uncomplicated: Secondary | ICD-10-CM | POA: Diagnosis not present

## 2015-09-02 DIAGNOSIS — H1045 Other chronic allergic conjunctivitis: Secondary | ICD-10-CM | POA: Diagnosis not present

## 2015-09-02 DIAGNOSIS — J3081 Allergic rhinitis due to animal (cat) (dog) hair and dander: Secondary | ICD-10-CM | POA: Diagnosis not present

## 2015-09-02 NOTE — Telephone Encounter (Signed)
The pt is taking 2 imodium in the morning and 2 at night and is having 10 loose stools daily.  Not a lot of volume but multiple times.  The evenings get some better and the stools are more formed until morning.  Please advise

## 2015-09-04 ENCOUNTER — Emergency Department
Admission: EM | Admit: 2015-09-04 | Discharge: 2015-09-04 | Disposition: A | Discharge status: Home / Self Care | Payer: Medicare Other | Attending: Emergency Medicine | Admitting: Emergency Medicine

## 2015-09-04 ENCOUNTER — Encounter: Payer: Self-pay | Admitting: Emergency Medicine

## 2015-09-04 DIAGNOSIS — I129 Hypertensive chronic kidney disease with stage 1 through stage 4 chronic kidney disease, or unspecified chronic kidney disease: Secondary | ICD-10-CM | POA: Diagnosis not present

## 2015-09-04 DIAGNOSIS — N183 Chronic kidney disease, stage 3 (moderate): Secondary | ICD-10-CM | POA: Diagnosis not present

## 2015-09-04 DIAGNOSIS — Z7902 Long term (current) use of antithrombotics/antiplatelets: Secondary | ICD-10-CM | POA: Diagnosis not present

## 2015-09-04 DIAGNOSIS — Z880 Allergy status to penicillin: Secondary | ICD-10-CM | POA: Insufficient documentation

## 2015-09-04 DIAGNOSIS — F419 Anxiety disorder, unspecified: Secondary | ICD-10-CM | POA: Diagnosis not present

## 2015-09-04 DIAGNOSIS — Z91040 Latex allergy status: Secondary | ICD-10-CM | POA: Diagnosis not present

## 2015-09-04 DIAGNOSIS — M542 Cervicalgia: Secondary | ICD-10-CM | POA: Diagnosis present

## 2015-09-04 DIAGNOSIS — E785 Hyperlipidemia, unspecified: Secondary | ICD-10-CM | POA: Insufficient documentation

## 2015-09-04 DIAGNOSIS — M5412 Radiculopathy, cervical region: Secondary | ICD-10-CM | POA: Insufficient documentation

## 2015-09-04 DIAGNOSIS — G8929 Other chronic pain: Secondary | ICD-10-CM | POA: Diagnosis not present

## 2015-09-04 DIAGNOSIS — F329 Major depressive disorder, single episode, unspecified: Secondary | ICD-10-CM | POA: Insufficient documentation

## 2015-09-04 DIAGNOSIS — Z79899 Other long term (current) drug therapy: Secondary | ICD-10-CM | POA: Insufficient documentation

## 2015-09-04 MED ORDER — TRAMADOL HCL 50 MG PO TABS: 50.0000 mg | ORAL_TABLET | Freq: Four times a day (QID) | ORAL | Status: DC | PRN

## 2015-09-04 MED ORDER — HYDROMORPHONE HCL 1 MG/ML IJ SOLN
0.5000 mg | Freq: Once | INTRAMUSCULAR | Status: AC
  Administered 2015-09-04: 0.5 mg via INTRAMUSCULAR
  Filled 2015-09-04: qty 1

## 2015-09-04 MED ORDER — KETOROLAC TROMETHAMINE 30 MG/ML IJ SOLN
30.0000 mg | Freq: Once | INTRAMUSCULAR | Status: AC
  Administered 2015-09-04: 30 mg via INTRAMUSCULAR

## 2015-09-04 NOTE — ED Notes (Signed)
History of arthritis and nerve pain in neck per husband, having more pain this week, no new injury

## 2015-09-04 NOTE — ED Provider Notes (Signed)
[facility] Emergency Department Provider Note  ____________________________________________  Time seen: Approximately 1:21 PM  I have reviewed the triage vital signs and the nursing notes.   HISTORY  Chief Complaint Neck Pain    HPI Aimee Jones is a 69 y.o. female complaining of radicular neck pain to the shoulders. Patient is a long-term history of cervical arthritis nerve pain. Posterior states in the past 3 days patient unable to sleep secondary to the pain.Wife stated that she's had a long-term history of dependence on narcotic pain medications and was weaned off these medications 3 months ago. Patient states she's had facet joint injections which is really decrease her level of pain. Patient is currently followed by the pain management clinic. Patient called the pain management clinic yesterday and was told no available appointments for least 2 weeks. Husband states they were advised to come to the emergency room.  Past Medical History  Diagnosis Date  . Hyperlipidemia   . ETOH abuse   . Allergy   . Diverticulosis   . Hypertension   . Depression   . Asthma   . GERD (gastroesophageal reflux disease)   . Normal 24 hour ambulatory pH monitoring study     good acid suppression  . Dementia   . Shortness of breath   . Anxiety     panic attacks- on occas.   . Arthritis     osteoporosis - especially hips.   . Stroke ([place])   . Memory loss   . Headache   . Confusion   . Hyponatremia 07/05/2015  . Observed seizure-like activity ([place]) 07/05/2015    observed by husband , Dr Doy Mince    Patient Active Problem List   Diagnosis Date Noted  . Elevated blood pressure 07/07/2015  . Yeast infection 07/06/2015  . Leukocytosis 07/05/2015  . Confusion   . Seizure-like activity ([place]) 07/04/2015  . Seizure ([place]) 07/04/2015  . Depression   . GI bleed 06/24/2015  . Frequent falls   . Narcotic abuse   . Benzodiazepine abuse   . Alcohol abuse   .  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 Generalized anxiety disorder   . Hypokalemia   . Hypomagnesemia   . Traumatic hematoma of left eyebrow   . Periorbital hematoma of left eye   . Anemia, iron deficiency   . Urinary retention   . Altered mental status 06/22/2015  . Fall 06/22/2015  . Iron deficiency anemia 06/03/2015  . Medicare annual wellness visit, subsequent 04/23/2015  . Advance care planning 04/23/2015  . Senile purpura ([place]) 04/23/2015  . Abnormal CBC 04/23/2015  . Neck pain 04/23/2015  . Bilateral arm pain 12/16/2014  . Sleep apnea 12/11/2014  . 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 CAP (community acquired pneumonia) 10/20/2014  . Stroke ([place])   . 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 HLD (hyperlipidemia)   . Facial droop 10/12/2014  . CKD (chronic kidney disease), stage III 10/12/2014  . S/P shoulder replacement 08/13/2014  . Cough 06/22/2014  . Postmenopausal HRT (hormone replacement therapy) 05/31/2014  . Hyponatremia 05/28/2014  . Shoulder pain, bilateral 10/25/2012  . Parkinsonism ([place]) 08/22/2012  . MEMORY LOSS 06/22/2010  . ALCOHOL ABUSE 03/08/2010  . 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 OBSTRUCTIVE CHRONIC BRONCHITIS WITH EXACERBATION 01/02/2008  . GLUCOSE INTOLERANCE 11/15/2007  . GERD 07/25/2007  . OSTEOARTHRITIS 07/25/2007  . PANIC ATTACK 05/01/2007  . Depressive disorder, not elsewhere classified 05/01/2007  . Essential hypertension  05/01/2007  . ALLERGIC RHINITIS 05/01/2007  . ASTHMA 05/01/2007  . DIVERTICULOSIS, COLON 05/01/2007  . MENOPAUSAL DISORDER 05/01/2007  . FIBROMYALGIA 05/01/2007    Past Surgical History  Procedure Laterality Date  . Dobutamine stress echo  07/09/2001    normal  . Esophagogastroduodenoscopy  06/11/2006    normal  . Abdominal hysterectomy    . Total hip arthroplasty      left 1991/right 2008  . Nasal sinus surgery  1990  . Eye surgery       cataracts removed, IOL- both eyes   . Breast surgery      3x- biposies   . Shoulder arthroscopy Left   . Reverse shoulder arthroplasty Right 08/13/2014    Procedure: REVERSE SHOULDER ARTHROPLASTY;  Surgeon: Marin Shutter, MD;  Location: [facility];  Service: Orthopedics;  Laterality: Right;  interscalene block  . Joint replacement      bilateral hips  . Loop recorder implant N/A 10/14/2014    Procedure: LOOP RECORDER IMPLANT;  Surgeon: Evans Lance, MD;  Location: [facility] CATH LAB;  Service: Cardiovascular;  Laterality: N/A;  . Tee without cardioversion N/A 10/14/2014    Procedure: TRANSESOPHAGEAL ECHOCARDIOGRAM (TEE);  Surgeon: Sueanne Margarita, MD;  Location: [facility] ENDOSCOPY;  Service: Cardiovascular;  Laterality: N/A;  . Esophagogastroduodenoscopy (egd) with propofol N/A 08/05/2015    Procedure: ESOPHAGOGASTRODUODENOSCOPY (EGD) WITH PROPOFOL;  Surgeon: [name], MD;  Location: [facility] ENDOSCOPY;  Service: Endoscopy;  Laterality: N/A;  . Colonoscopy with propofol N/A 08/05/2015    Procedure: COLONOSCOPY WITH PROPOFOL;  Surgeon: [name], MD;  Location: [facility] ENDOSCOPY;  Service: Endoscopy;  Laterality: N/A;    Current Outpatient Rx  Name  Route  Sig  Dispense  Refill  . acetaminophen (TYLENOL) 500 MG tablet   Oral   Take 1-2 tablets (500-1,000 mg total) by mouth every 6 (six) hours as needed for mild pain.   30 tablet      . amLODipine (NORVASC) 2.5 MG tablet      TAKE 1 TABLET BY MOUTH DAILY Patient not taking: Reported on 08/24/2015   30 tablet   0   . atorvastatin (LIPITOR) 10 MG tablet   Oral   Take 10 mg by mouth daily at 6 PM.          . clopidogrel (PLAVIX) 75 MG tablet   Oral   Take 1 tablet (75 mg total) by mouth daily.   90 tablet   3   . donepezil (ARICEPT) 10 MG tablet      TAKE 1 TABLET BY MOUTH DAILY AT BEDTIME   90 tablet   1   . ferrous sulfate 325 (65 FE) MG tablet      TAKE 1 TABLET BY MOUTH 2 TIMES DAILY WITH A MEAL   90 tablet   0   .  fluticasone (FLONASE) 50 MCG/ACT nasal spray   Each Nare   Place 1-2 sprays into both nostrils daily. Patient taking differently: Place 1-2 sprays into both nostrils daily as needed for allergies.    16 g   6   . hydrocortisone (ANUSOL-HC) 2.5 % rectal cream      Apply rectally 2 times daily Patient taking differently: Place 1 application rectally 2 (two) times daily as needed for hemorrhoids.    30 g   0   . montelukast (SINGULAIR) 10 MG tablet   Oral   Take 10 mg by mouth at bedtime.          Marland Kitchen  omeprazole (PRILOSEC) 20 MG capsule      TAKE 1 CAPSULE TWICE DAILY FOR 10 DAYS, THEN DECREASE TO NORMAL ONCE DAILY DOSING Patient taking differently: Take one tablet by mouth nightly.   30 capsule   5   . polyvinyl alcohol (LIQUIFILM TEARS) 1.4 % ophthalmic solution   Both Eyes   Place 1 drop into both eyes daily as needed for dry eyes (allergies).         Marland Kitchen PRESCRIPTION MEDICATION   Subcutaneous   Inject 1 each into the skin. Supportive therapy [place] CC-infusion         . SYMBICORT 160-4.5 MCG/ACT inhaler      INHALE 2 PUFFS INTO THE LUNGS 2 (TWO) TIMES DAILY AS NEEDED (SHORTNESS OF BREATH).   10.2 Inhaler   7   . traMADol (ULTRAM) 50 MG tablet   Oral   Take 1 tablet (50 mg total) by mouth every 6 (six) hours as needed.   20 tablet   0   . vitamin E 400 UNIT capsule   Oral   Take 400 Units by mouth daily.           Allergies Latex and Penicillins  Family History  Problem Relation Age of Onset  . Sudden death Brother   . Heart attack Brother   . Hypertension Brother   . Esophageal cancer Brother   . Heart attack Father   . Heart block Mother   . Stroke Brother   . Stroke Mother     Social History Social History  Substance Use Topics  . Smoking status: Never Smoker   . Smokeless tobacco: Never Used  . Alcohol Use: 0.0 oz/week    0 Standard drinks or equivalent per week     Comment: grand Mariener - almost daily- 1/2 shot             07/05/2015 no  alcohol use at this time     Review of Systems Constitutional: No fever/chills Eyes: No visual changes. ENT: No sore throat. Cardiovascular: Denies chest pain. Respiratory: Denies shortness of breath. Gastrointestinal: No abdominal pain.  No nausea, no vomiting.  No diarrhea.  No constipation. Genitourinary: Negative for dysuria. Musculoskeletal: Neck supple back and shoulder pain.  Skin: Negative for rash. Neurological: Negative for headaches, focal weakness or numbness. Psychiatric:Depression and anxiety.  Endocrine:Hyperlipidemia and hypertension Hematological/Lymphatic: Allergic/Immunilogical: Latex and penicillin  10-point ROS otherwise negative.  ____________________________________________   PHYSICAL EXAM:  VITAL SIGNS: ED Triage Vitals  Enc Vitals Group     BP 09/04/15 1244 146/78 mmHg     Pulse Rate 09/04/15 1244 82     Resp 09/04/15 1244 18     Temp 09/04/15 1244 97.5 F (36.4 C)     Temp Source 09/04/15 1244 Oral     SpO2 09/04/15 1244 97 %     Weight 09/04/15 1244 162 lb (73.483 kg)     Height 09/04/15 1244 5\' 7"  (1.702 m)     Head Cir --      Peak Flow --      Pain Score 09/04/15 1245 5     Pain Loc --      Pain Edu? --      Excl. in [place]? --     Constitutional: Alert and oriented. Well appearing and in no acute distress. Eyes: Conjunctivae are normal. PERRL. EOMI. Head: Atraumatic. Nose: No congestion/rhinnorhea. Mouth/Throat: Mucous membranes are moist.  Oropharynx non-erythematous. Neck: No stridor.   cervical spine tenderness to palpation at  C5 and 6 Hematological/Lymphatic/Immunilogical: No cervical lymphadenopathy. Cardiovascular: Normal rate, regular rhythm. Grossly normal heart sounds.  Good peripheral circulation. Respiratory: Normal respiratory effort.  No retractions. Lungs CTAB. Gastrointestinal: Soft and nontender. No distention. No abdominal bruits. No CVA tenderness. Musculoskeletal: No cervical deformity. Patient has decreased range  of motion with flexion and extension.  Neurologic:  Normal speech and language. No gross focal neurologic deficits are appreciated. No gait instability. Skin:  Skin is warm, dry and intact. No rash noted. Psychiatric: Mood and affect are normal. Speech and behavior are normal.  ____________________________________________   LABS (all labs ordered are listed, but only abnormal results are displayed)  Labs Reviewed - No data to display ____________________________________________  EKG   ____________________________________________  RADIOLOGY   ____________________________________________   PROCEDURES  Procedure(s) performed: None  Critical Care performed: No  ____________________________________________   INITIAL IMPRESSION / ASSESSMENT AND PLAN / ED COURSE  Pertinent labs & imaging results that were available during my care of the patient were reviewed by me and considered in my medical decision making (see chart for details).  Radicular cervical pain to the bilateral shoulders. Patient discharged with prescription for tramadol and Medrol Dosepak. Patient advised to follow-up with pain management or PCP. ____________________________________________   FINAL CLINICAL IMPRESSION(S) / ED DIAGNOSES  Final diagnoses:  Chronic radicular cervical pain       Sable Feil, PA-C 09/04/15 [place], MD 09/04/15 1614

## 2015-09-04 NOTE — Discharge Instructions (Signed)
Cervical Radiculopathy Cervical radiculopathy means that a nerve in the neck is pinched or bruised. This can cause pain or loss of feeling (numbness) that runs from your neck to your arm and fingers. HOME CARE Managing Pain  Take over-the-counter and prescription medicines only as told by your doctor.  If directed, put ice on the injured or painful area.  Put ice in a plastic bag.  Place a towel between your skin and the bag.  Leave the ice on for 20 minutes, 2-3 times per day.  If ice does not help, you can try using heat. Take a warm shower or warm bath, or use a heat pack as told by your doctor.  You may try a gentle neck and shoulder massage. Activity  Rest as needed. Follow instructions from your doctor about any activities to avoid.  Do exercises as told by your doctor or physical therapist. General Instructions   If you were given a soft collar, wear it as told by your doctor.  Use a flat pillow when you sleep.  Keep all follow-up visits as told by your doctor. This is important. GET HELP IF:  Your condition does not improve with treatment. GET HELP RIGHT AWAY IF:   Your pain gets worse and is not controlled with medicine.  You lose feeling or feel weak in your hand, arm, face, or leg.  You have a fever.  You have a stiff neck.  You cannot control when you poop or pee (have incontinence).  You have trouble with walking, balance, or talking.   This information is not intended to replace advice given to you by your health care provider. Make sure you discuss any questions you have with your health care provider.   Document Released: 09/21/2011 Document Revised: 06/23/2015 Document Reviewed: 11/26/2014 Elsevier Interactive Patient Education 2016 [employer] Inc[employer]  

## 2015-09-06 NOTE — Telephone Encounter (Signed)
Spoke with Aimee Jones;pt did get med previously.

## 2015-09-07 ENCOUNTER — Ambulatory Visit (INDEPENDENT_AMBULATORY_CARE_PROVIDER_SITE_OTHER): Payer: Medicare Other | Admitting: Family Medicine

## 2015-09-07 ENCOUNTER — Encounter: Payer: Self-pay | Admitting: Family Medicine

## 2015-09-07 VITALS — BP 122/82 | HR 79 | Temp 97.4°F | Wt 163.5 lb

## 2015-09-07 DIAGNOSIS — M25512 Pain in left shoulder: Secondary | ICD-10-CM | POA: Diagnosis not present

## 2015-09-07 DIAGNOSIS — J3089 Other allergic rhinitis: Secondary | ICD-10-CM | POA: Diagnosis not present

## 2015-09-07 DIAGNOSIS — M25511 Pain in right shoulder: Secondary | ICD-10-CM | POA: Diagnosis not present

## 2015-09-07 DIAGNOSIS — I10 Essential (primary) hypertension: Secondary | ICD-10-CM

## 2015-09-07 DIAGNOSIS — I6322 Cerebral infarction due to unspecified occlusion or stenosis of basilar arteries: Secondary | ICD-10-CM

## 2015-09-07 MED ORDER — BUPRENORPHINE 7.5 MCG/HR TD PTWK: 1.0000 | MEDICATED_PATCH | TRANSDERMAL | Status: DC

## 2015-09-07 NOTE — Progress Notes (Signed)
Pre visit review using our clinic review tool, if applicable. No additional management support is needed unless otherwise documented below in the visit note.  She had more pain in the shoulders, L>R, describing a radicular pain.  Was unable to lay down in bed w/o a lot a pain.  She was better when she got out of bed.  She is taking tylenol for pain.  Husband isn't giving her other meds, other than prescribed meds.  She was discharged from ER with prescription for tramadol.  Tramadol helped better than the tylenol but it still doesn't help much.  Had been on butrans patch, has been increased up to 7.5mg .  Has patch on currently, at [facility].    There were concerns previously about the pain med dispensing at home.  I will not write for opiates for patient and I told pt/spouse that at [facility] [place]. D/w them about tramadol taper given her hx.   She has f/u with Dr. Vira Blanco for treatment. I called his office at the time of the OV and left message for Dr. Andree Elk (in the same office).  Has been off amlodipine for > 1 week and BP is still controlled.  Advised them to stay off for now and continue BP checks.   Meds, vitals, and allergies reviewed.   ROS: See HPI.  Otherwise, noncontributory.  nad ncat Neck supple rrr ctab abd soft, not ttp Pain with int>ext rotation L shoulder.  S/S wnl o/w on the BLE.

## 2015-09-07 NOTE — Patient Instructions (Signed)
Cut the tramadol back to 1 tab twice a day for 2 days, then take it once a day for 2 days, then stop.  I called Dr. Andree Elk' office in the meantime. I will send word when I have any advice to offer.   Continue tylenol as scheduled.  I would try to sleep sitting up in the recliner.  I did not write for any other pain meds at this point.

## 2015-09-08 ENCOUNTER — Telehealth: Payer: Self-pay | Admitting: Family Medicine

## 2015-09-08 NOTE — Assessment & Plan Note (Signed)
>  25 minutes spent in face to face time with patient, >50% spent in counselling or coordination of care I'll await call back from pain clinic.  I didn't and will not rx controlled meds at this point given her prev situation at home; I told them this.  Continue butrans patch for now.  Tylenol prn.

## 2015-09-08 NOTE — Assessment & Plan Note (Signed)
BP okay off amlodipine, continue off med for now.

## 2015-09-08 NOTE — Telephone Encounter (Signed)
Notify pt/family. I talked with Dr. Andree Elk- phone call late Wed 09/08/15.  I don't have good options for her situation/pain.  I discussed my rationale from the last OV.  He agrees.  Reasonable to continue as is, with plan for intervention by Dr. Vira Blanco in the meantime.

## 2015-09-10 LAB — CUP PACEART REMOTE DEVICE CHECK: MDC IDC SESS DTM: 20161026233850

## 2015-09-10 NOTE — Progress Notes (Signed)
Carelink summary report received. Battery status OK. Normal device function. No new symptom episodes, tachy episodes, brady, or pause episodes. No new AF episodes. Monthly summary reports and ROV with [facility] in 10/2015 (recall letter sent).

## 2015-09-13 ENCOUNTER — Inpatient Hospital Stay (HOSPITAL_BASED_OUTPATIENT_CLINIC_OR_DEPARTMENT_OTHER): Payer: Medicare Other | Admitting: Oncology

## 2015-09-13 ENCOUNTER — Ambulatory Visit (INDEPENDENT_AMBULATORY_CARE_PROVIDER_SITE_OTHER): Payer: Medicare Other | Admitting: *Deleted

## 2015-09-13 ENCOUNTER — Inpatient Hospital Stay: Payer: Medicare Other | Attending: Oncology | Admitting: *Deleted

## 2015-09-13 ENCOUNTER — Inpatient Hospital Stay: Payer: Medicare Other

## 2015-09-13 VITALS — BP 169/97 | HR 76 | Temp 97.2°F | Resp 18 | Wt 164.2 lb

## 2015-09-13 DIAGNOSIS — G8929 Other chronic pain: Secondary | ICD-10-CM | POA: Diagnosis not present

## 2015-09-13 DIAGNOSIS — Z7902 Long term (current) use of antithrombotics/antiplatelets: Secondary | ICD-10-CM | POA: Insufficient documentation

## 2015-09-13 DIAGNOSIS — I639 Cerebral infarction, unspecified: Secondary | ICD-10-CM | POA: Diagnosis not present

## 2015-09-13 DIAGNOSIS — F419 Anxiety disorder, unspecified: Secondary | ICD-10-CM | POA: Diagnosis not present

## 2015-09-13 DIAGNOSIS — D473 Essential (hemorrhagic) thrombocythemia: Secondary | ICD-10-CM | POA: Diagnosis not present

## 2015-09-13 DIAGNOSIS — D509 Iron deficiency anemia, unspecified: Secondary | ICD-10-CM | POA: Insufficient documentation

## 2015-09-13 DIAGNOSIS — I10 Essential (primary) hypertension: Secondary | ICD-10-CM

## 2015-09-13 DIAGNOSIS — D72829 Elevated white blood cell count, unspecified: Secondary | ICD-10-CM

## 2015-09-13 DIAGNOSIS — E785 Hyperlipidemia, unspecified: Secondary | ICD-10-CM | POA: Diagnosis not present

## 2015-09-13 DIAGNOSIS — D692 Other nonthrombocytopenic purpura: Secondary | ICD-10-CM

## 2015-09-13 DIAGNOSIS — M549 Dorsalgia, unspecified: Secondary | ICD-10-CM

## 2015-09-13 DIAGNOSIS — J45909 Unspecified asthma, uncomplicated: Secondary | ICD-10-CM

## 2015-09-13 DIAGNOSIS — Z8673 Personal history of transient ischemic attack (TIA), and cerebral infarction without residual deficits: Secondary | ICD-10-CM | POA: Insufficient documentation

## 2015-09-13 DIAGNOSIS — Z79899 Other long term (current) drug therapy: Secondary | ICD-10-CM

## 2015-09-13 DIAGNOSIS — K219 Gastro-esophageal reflux disease without esophagitis: Secondary | ICD-10-CM | POA: Diagnosis not present

## 2015-09-13 LAB — CBC WITH DIFFERENTIAL/PLATELET
Basophils Absolute: 0.1 10*3/uL (ref 0–0.1)
Basophils Relative: 1 %
Eosinophils Absolute: 0.4 10*3/uL (ref 0–0.7)
Eosinophils Relative: 5 %
HEMATOCRIT: 42.4 % (ref 35.0–47.0)
HEMOGLOBIN: 13.8 g/dL (ref 12.0–16.0)
LYMPHS ABS: 2.7 10*3/uL (ref 1.0–3.6)
LYMPHS PCT: 29 %
MCH: 27.7 pg (ref 26.0–34.0)
MCHC: 32.4 g/dL (ref 32.0–36.0)
MCV: 85.4 fL (ref 80.0–100.0)
MONOS PCT: 7 %
Monocytes Absolute: 0.7 10*3/uL (ref 0.2–0.9)
NEUTROS ABS: 5.4 10*3/uL (ref 1.4–6.5)
NEUTROS PCT: 58 %
Platelets: 572 10*3/uL — ABNORMAL HIGH (ref 150–440)
RBC: 4.97 MIL/uL (ref 3.80–5.20)
RDW: 16 % — ABNORMAL HIGH (ref 11.5–14.5)
WBC: 9.3 10*3/uL (ref 3.6–11.0)

## 2015-09-13 LAB — IRON AND TIBC
Iron: 62 ug/dL (ref 28–170)
SATURATION RATIOS: 23 % (ref 10.4–31.8)
TIBC: 265 ug/dL (ref 250–450)
UIBC: 203 ug/dL

## 2015-09-13 LAB — FERRITIN: Ferritin: 137 ng/mL (ref 11–307)

## 2015-09-13 NOTE — Telephone Encounter (Signed)
Husband advised. 

## 2015-09-13 NOTE — Telephone Encounter (Signed)
Left message on patient's voicemail to return call

## 2015-09-13 NOTE — Progress Notes (Signed)
Patient is dealing with her chronic pain is followed by PCP and pain clinic.

## 2015-09-14 DIAGNOSIS — J3089 Other allergic rhinitis: Secondary | ICD-10-CM | POA: Diagnosis not present

## 2015-09-14 DIAGNOSIS — H40013 Open angle with borderline findings, low risk, bilateral: Secondary | ICD-10-CM | POA: Diagnosis not present

## 2015-09-14 DIAGNOSIS — H353120 Nonexudative age-related macular degeneration, left eye, stage unspecified: Secondary | ICD-10-CM | POA: Diagnosis not present

## 2015-09-14 DIAGNOSIS — H353110 Nonexudative age-related macular degeneration, right eye, stage unspecified: Secondary | ICD-10-CM | POA: Diagnosis not present

## 2015-09-14 DIAGNOSIS — Z8679 Personal history of other diseases of the circulatory system: Secondary | ICD-10-CM | POA: Diagnosis not present

## 2015-09-14 DIAGNOSIS — J3081 Allergic rhinitis due to animal (cat) (dog) hair and dander: Secondary | ICD-10-CM | POA: Diagnosis not present

## 2015-09-14 LAB — ANA W/REFLEX: Anti Nuclear Antibody(ANA): NEGATIVE

## 2015-09-15 NOTE — Progress Notes (Signed)
LOOP RECORDER  

## 2015-09-17 NOTE — Telephone Encounter (Signed)
Left message for pt to call back.  Spoke with pts husband and he states that since they called her stools are now solid and she isn't having to take the imodium any more. States she is taking an iron pill now and the stool is dark. Instructed husband to call back if she started having problems again. He verbalized understanding.

## 2015-09-17 NOTE — Telephone Encounter (Signed)
Lets try cholestyramine powder, 4gm, twice daily in addition to the immodium. Have her call in a week to report on her response.

## 2015-09-19 NOTE — Progress Notes (Signed)
[place]  [phone] [phone]  ID: Aimee Jones OB: 02/11/1946  MR#: [id number]  [id number]  Patient Care Team: Tonia Ghent, MD as PCP - General (Family Medicine) Justice Britain, MD as Consulting Physician (Orthopedic Surgery)  CHIEF COMPLAINT:  Chief Complaint  Patient presents with  . Anemia    INTERVAL HISTORY: Patient returns to clinic today for repeat laboratory work and further evaluation. She continues to have significant chronic pain which is being managed by her primary care physician and pain clinic. She otherwise feels well. She has no neurologic complaints. She denies any recent fevers. She has a good appetite and denies weight loss. She has no chest pain or shortness of breath. She denies any nausea, vomiting, constipation, or diarrhea. She has no melanotic or hematochezia. She has no urinary complaints. Patient offers no further specific complaints today.  REVIEW OF SYSTEMS:   Review of Systems  Constitutional: Negative for fever and malaise/fatigue.  Respiratory: Negative.   Cardiovascular: Negative.   Gastrointestinal: Negative for blood in stool and melena.  Musculoskeletal: Positive for back pain, joint pain and neck pain.  Neurological: Negative for weakness.    As per HPI. Otherwise, a complete review of systems is negatve.  PAST MEDICAL HISTORY: Past Medical History  Diagnosis Date  . Hyperlipidemia   . ETOH abuse   . Allergy   . Diverticulosis   . Hypertension   . Depression   . Asthma   . GERD (gastroesophageal reflux disease)   . Normal 24 hour ambulatory pH monitoring study     good acid suppression  . Dementia   . Shortness of breath   . Anxiety     panic attacks- on occas.   . Arthritis     osteoporosis - especially hips.   . Stroke ([place])   . Memory loss   . Headache   . Confusion   . Hyponatremia 07/05/2015  . Observed seizure-like activity ([place]) 07/05/2015    observed by  husband , Dr Doy Mince    PAST SURGICAL HISTORY: Past Surgical History  Procedure Laterality Date  . Dobutamine stress echo  07/09/2001    normal  . Esophagogastroduodenoscopy  06/11/2006    normal  . Abdominal hysterectomy    . Total hip arthroplasty      left 1991/right 2008  . Nasal sinus surgery  1990  . Eye surgery      cataracts removed, IOL- both eyes   . Breast surgery      3x- biposies   . Shoulder arthroscopy Left   . Reverse shoulder arthroplasty Right 08/13/2014    Procedure: REVERSE SHOULDER ARTHROPLASTY;  Surgeon: Marin Shutter, MD;  Location: [place];  Service: Orthopedics;  Laterality: Right;  interscalene block  . Joint replacement      bilateral hips  . Loop recorder implant N/A 10/14/2014    Procedure: LOOP RECORDER IMPLANT;  Surgeon: Evans Lance, MD;  Location: [facility] CATH LAB;  Service: Cardiovascular;  Laterality: N/A;  . Tee without cardioversion N/A 10/14/2014    Procedure: TRANSESOPHAGEAL ECHOCARDIOGRAM (TEE);  Surgeon: Sueanne Margarita, MD;  Location: [facility] ENDOSCOPY;  Service: Cardiovascular;  Laterality: N/A;  . Esophagogastroduodenoscopy (egd) with propofol N/A 08/05/2015    Procedure: ESOPHAGOGASTRODUODENOSCOPY (EGD) WITH PROPOFOL;  Surgeon: [name], MD;  Location: [facility] ENDOSCOPY;  Service: Endoscopy;  Laterality: N/A;  . Colonoscopy with propofol N/A 08/05/2015    Procedure: COLONOSCOPY WITH PROPOFOL;  Surgeon: [name], MD;  Location: [facility]  ENDOSCOPY;  Service: Endoscopy;  Laterality: N/A;    FAMILY HISTORY Family History  Problem Relation Age of Onset  . Sudden death Brother   . Heart attack Brother   . Hypertension Brother   . Esophageal cancer Brother   . Heart attack Father   . Heart block Mother   . Stroke Brother   . Stroke Mother        ADVANCED DIRECTIVES:    HEALTH MAINTENANCE: Social History  Substance Use Topics  . Smoking status: Never Smoker   . Smokeless tobacco: Never Used  . Alcohol Use: 0.0 oz/week    0 Standard  drinks or equivalent per week     Comment: grand Mariener - almost daily- 1/2 shot             07/05/2015 no alcohol use at this time      Colonoscopy:  PAP:  Bone density:  Lipid panel:  Allergies  Allergen Reactions  . Tramadol Other (See Comments)    Would avoid.  H/o SZ .  Marland Kitchen Latex Rash  . Penicillins Rash    .Marland KitchenHas patient had a PCN reaction causing immediate rash, facial/tongue/throat swelling, SOB or lightheadedness with hypotension: No Has patient had a PCN reaction causing severe rash involving mucus membranes or skin necrosis: No Has patient had a PCN reaction that required hospitalization No Has patient had a PCN reaction occurring within the last 10 years: No If all of the above answers are "NO", then may proceed with Cephalosporin use.      Current Outpatient Prescriptions  Medication Sig Dispense Refill  . acetaminophen (TYLENOL) 500 MG tablet Take 1-2 tablets (500-1,000 mg total) by mouth every 6 (six) hours as needed for mild pain. 30 tablet   . atorvastatin (LIPITOR) 10 MG tablet Take 10 mg by mouth daily at 6 PM.     . Buprenorphine (BUTRANS) 7.5 MCG/HR PTWK Place 1 patch onto the skin every 7 (seven) days.    . clopidogrel (PLAVIX) 75 MG tablet Take 1 tablet (75 mg total) by mouth daily. 90 tablet 3  . donepezil (ARICEPT) 10 MG tablet TAKE 1 TABLET BY MOUTH DAILY AT BEDTIME 90 tablet 1  . ferrous sulfate 325 (65 FE) MG tablet TAKE 1 TABLET BY MOUTH 2 TIMES DAILY WITH A MEAL (Patient taking differently: TAKE 1 TABLET BY MOUTH 1 TIMES DAILY WITH A MEAL) 90 tablet 0  . fluticasone (FLONASE) 50 MCG/ACT nasal spray Place 1-2 sprays into both nostrils daily. (Patient taking differently: Place 1-2 sprays into both nostrils daily as needed for allergies. ) 16 g 6  . hydrocortisone (ANUSOL-HC) 2.5 % rectal cream Apply rectally 2 times daily (Patient taking differently: Place 1 application rectally 2 (two) times daily as needed for hemorrhoids. ) 30 g 0  . montelukast  (SINGULAIR) 10 MG tablet Take 10 mg by mouth at bedtime.     Marland Kitchen omeprazole (PRILOSEC) 20 MG capsule TAKE 1 CAPSULE TWICE DAILY FOR 10 DAYS, THEN DECREASE TO NORMAL ONCE DAILY DOSING (Patient taking differently: Take one tablet by mouth nightly.) 30 capsule 5  . polyvinyl alcohol (LIQUIFILM TEARS) 1.4 % ophthalmic solution Place 1 drop into both eyes daily as needed for dry eyes (allergies).    Marland Kitchen PRESCRIPTION MEDICATION Inject 1 each into the skin. Supportive therapy [place] CC-infusion    . SYMBICORT 160-4.5 MCG/ACT inhaler INHALE 2 PUFFS INTO THE LUNGS 2 (TWO) TIMES DAILY AS NEEDED (SHORTNESS OF BREATH). 10.2 Inhaler 7  . vitamin E 400 UNIT  capsule Take 400 Units by mouth daily.    Marland Kitchen amLODipine (NORVASC) 2.5 MG tablet Take 2.5 mg by mouth daily.  0   No current facility-administered medications for this visit.    OBJECTIVE: Filed Vitals:   09/13/15 1420  BP: 169/97  Pulse: 76  Temp: 97.2 F (36.2 C)  Resp: 18     Body mass index is 25.72 kg/(m^2).    ECOG FS:1 - Symptomatic but completely ambulatory  General: Well-developed, well-nourished, no acute distress. Eyes: Pink conjunctiva, anicteric sclera. Lungs: Clear to auscultation bilaterally. Heart: Regular rate and rhythm. No rubs, murmurs, or gallops. Abdomen: Soft, nontender, nondistended. No organomegaly noted, normoactive bowel sounds. Musculoskeletal: No edema, cyanosis, or clubbing. Neuro: Alert, answering all questions appropriately. Cranial nerves grossly intact. Skin: No rashes or petechiae noted. Psych: Normal affect.   LAB RESULTS:  Lab Results  Component Value Date   NA 138 07/29/2015   K 3.7 07/29/2015   CL 105 07/29/2015   CO2 27 07/29/2015   GLUCOSE 101* 07/29/2015   BUN 10 07/29/2015   CREATININE 0.85 07/29/2015   CALCIUM 9.6 07/29/2015   PROT 7.0 07/19/2015   ALBUMIN 4.0 07/19/2015   AST 17 07/19/2015   ALT 15 07/19/2015   ALKPHOS 77 07/19/2015   BILITOT 0.4 07/19/2015   GFRNONAA 53* 07/08/2015   GFRAA  >60 07/08/2015    Lab Results  Component Value Date   WBC 9.3 09/13/2015   NEUTROABS 5.4 09/13/2015   HGB 13.8 09/13/2015   HCT 42.4 09/13/2015   MCV 85.4 09/13/2015   PLT 572* 09/13/2015     STUDIES: No results found.  ASSESSMENT: Leukocytosis, iron deficiency anemia, thrombocytosis.  PLAN:    1. Iron deficiency anemia: Patient's hemoglobin and iron stores are within normal limits. Previously, the remainder of her laboratory work was also negative or within normal limits. Colonoscopy and EGD in October 2016 were unrevealing.  She does not require additional IV iron today. Patient last received 510 mg IV Feraheme in August 2016. Return to clinic in 4 months with repeat laboratory work and further evaluation.  2. Leukocytosis: Resolved.  Peripheral blood flow cytometry as well as BCR-ABL mutation are negative.  3. Thrombocytosis: JAK-2 mutation negative. Monitor. 4. Hypertension: Patient's blood pressure is mildly elevated today. Continue current medications. 5. Pain: Continue treatment per primary care and pain clinic.  Patient expressed understanding and was in agreement with this plan. She also understands that She can call clinic at any time with any questions, concerns, or complaints.    Lloyd Huger, MD   09/19/2015 8:10 AM

## 2015-09-21 ENCOUNTER — Encounter: Payer: Self-pay | Admitting: Family Medicine

## 2015-09-21 ENCOUNTER — Emergency Department
Admission: EM | Admit: 2015-09-21 | Discharge: 2015-09-21 | Disposition: A | Discharge status: Home / Self Care | Payer: Medicare Other | Attending: Emergency Medicine | Admitting: Emergency Medicine

## 2015-09-21 DIAGNOSIS — I10 Essential (primary) hypertension: Secondary | ICD-10-CM | POA: Diagnosis not present

## 2015-09-21 DIAGNOSIS — M542 Cervicalgia: Secondary | ICD-10-CM | POA: Diagnosis present

## 2015-09-21 DIAGNOSIS — J3081 Allergic rhinitis due to animal (cat) (dog) hair and dander: Secondary | ICD-10-CM | POA: Diagnosis not present

## 2015-09-21 DIAGNOSIS — M5412 Radiculopathy, cervical region: Secondary | ICD-10-CM | POA: Diagnosis not present

## 2015-09-21 DIAGNOSIS — G8929 Other chronic pain: Secondary | ICD-10-CM | POA: Diagnosis not present

## 2015-09-21 DIAGNOSIS — Z79899 Other long term (current) drug therapy: Secondary | ICD-10-CM | POA: Diagnosis not present

## 2015-09-21 DIAGNOSIS — J3089 Other allergic rhinitis: Secondary | ICD-10-CM | POA: Diagnosis not present

## 2015-09-21 MED ORDER — TRAMADOL HCL 50 MG PO TABS
50.0000 mg | ORAL_TABLET | Freq: Once | ORAL | Status: AC
  Administered 2015-09-21: 50 mg via ORAL
  Filled 2015-09-21: qty 1

## 2015-09-21 MED ORDER — KETOROLAC TROMETHAMINE 30 MG/ML IJ SOLN
30.0000 mg | Freq: Once | INTRAMUSCULAR | Status: AC
  Administered 2015-09-21: 30 mg via INTRAMUSCULAR
  Filled 2015-09-21: qty 1

## 2015-09-21 MED ORDER — TRAMADOL HCL 50 MG PO TABS: 50.0000 mg | ORAL_TABLET | Freq: Four times a day (QID) | ORAL | Status: DC | PRN

## 2015-09-21 NOTE — Discharge Instructions (Signed)

## 2015-09-21 NOTE — ED Notes (Signed)
Pt states she has chronic pain in BL shoulders and is seen at the pain clinic.. States the pain is severe and cant rest..

## 2015-09-21 NOTE — ED Notes (Signed)
Pt in w/ complaints of bilateral shoulder pain; reports that pain is from pinched nerves in neck.  Pt is scheduled for first "block" treatment on Dec 15 and is using Tylenol/Aleve until then but is no longer getting any relief.

## 2015-09-21 NOTE — ED Provider Notes (Signed)
CSN: [id number]     Arrival date & time 09/21/15  1806 History   First MD Initiated Contact with Patient 09/21/15 1851     Chief Complaint  Patient presents with  . Shoulder Pain     (Consider location/radiation/quality/duration/timing/severity/associated sxs/prior Treatment) HPI  69 year old female presents to the emergency department for evaluation of bilateral shoulder pain. She has a history of chronic cervical radiculopathy. She has had 3 cervical epidural injections with mild relief. She has been controlling her pain with Tylenol and Aleve. In the past, patient was treated with opioids and was weaned off of these medications. She is scheduled on 09/30/2015 to have a" cervical block". Patient was recently seen in the emergency department on 09/04/2015 treated with Medrol Dosepak and tramadol. She is also given a Toradol injection. Toradol injection gave her excellent relief along with the tramadol. She did not receive any relief from the Medrol Dosepak. Patient is not under pain contract. She denies any new symptoms, new trauma or injury. Patient states her pain is 7 out of 10 and radiates from the neck into bilateral shoulders along the superior scapular border. No numbness or tingling throughout the hands.  Past Medical History  Diagnosis Date  . Hyperlipidemia   . ETOH abuse   . Allergy   . Diverticulosis   . Hypertension   . Depression   . Asthma   . GERD (gastroesophageal reflux disease)   . Normal 24 hour ambulatory pH monitoring study     good acid suppression  . Dementia   . Shortness of breath   . Anxiety     panic attacks- on occas.   . Arthritis     osteoporosis - especially hips.   . Stroke ([place])   . Memory loss   . Headache   . Confusion   . Hyponatremia 07/05/2015  . Observed seizure-like activity ([place]) 07/05/2015    observed by husband , Dr Doy Mince   Past Surgical History  Procedure Laterality Date  . Dobutamine stress echo  07/09/2001    normal  .  Esophagogastroduodenoscopy  06/11/2006    normal  . Abdominal hysterectomy    . Total hip arthroplasty      left 1991/right 2008  . Nasal sinus surgery  1990  . Eye surgery      cataracts removed, IOL- both eyes   . Breast surgery      3x- biposies   . Shoulder arthroscopy Left   . Reverse shoulder arthroplasty Right 08/13/2014    Procedure: REVERSE SHOULDER ARTHROPLASTY;  Surgeon: Marin Shutter, MD;  Location: [place];  Service: Orthopedics;  Laterality: Right;  interscalene block  . Joint replacement      bilateral hips  . Loop recorder implant N/A 10/14/2014    Procedure: LOOP RECORDER IMPLANT;  Surgeon: Evans Lance, MD;  Location: [facility] CATH LAB;  Service: Cardiovascular;  Laterality: N/A;  . Tee without cardioversion N/A 10/14/2014    Procedure: TRANSESOPHAGEAL ECHOCARDIOGRAM (TEE);  Surgeon: Sueanne Margarita, MD;  Location: [facility] ENDOSCOPY;  Service: Cardiovascular;  Laterality: N/A;  . Esophagogastroduodenoscopy (egd) with propofol N/A 08/05/2015    Procedure: ESOPHAGOGASTRODUODENOSCOPY (EGD) WITH PROPOFOL;  Surgeon: [name], MD;  Location: [facility] ENDOSCOPY;  Service: Endoscopy;  Laterality: N/A;  . Colonoscopy with propofol N/A 08/05/2015    Procedure: COLONOSCOPY WITH PROPOFOL;  Surgeon: [name], MD;  Location: [facility] ENDOSCOPY;  Service: Endoscopy;  Laterality: N/A;   Family History  Problem Relation Age of Onset  .  Sudden death Brother   . Heart attack Brother   . Hypertension Brother   . Esophageal cancer Brother   . Heart attack Father   . Heart block Mother   . Stroke Brother   . Stroke Mother    Social History  Substance Use Topics  . Smoking status: Never Smoker   . Smokeless tobacco: Never Used  . Alcohol Use: 0.0 oz/week    0 Standard drinks or equivalent per week     Comment: grand Mariener - almost daily- 1/2 shot             07/05/2015 no alcohol use at this time    OB History    No data available     Review of Systems  Constitutional: Negative  for fever, chills, activity change and fatigue.  HENT: Negative for congestion, sinus pressure and sore throat.   Eyes: Negative for visual disturbance.  Respiratory: Negative for cough, chest tightness and shortness of breath.   Cardiovascular: Negative for chest pain and leg swelling.  Gastrointestinal: Negative for nausea, vomiting, abdominal pain and diarrhea.  Genitourinary: Negative for dysuria.  Musculoskeletal: Positive for neck pain (CHRONIC PAIN). Negative for arthralgias and gait problem.  Skin: Negative for rash.  Neurological: Negative for weakness, numbness and headaches.  Hematological: Negative for adenopathy.  Psychiatric/Behavioral: Negative for behavioral problems, confusion and agitation.      Allergies  Tramadol; Latex; and Penicillins  Home Medications   Prior to Admission medications   Medication Sig Start Date End Date Taking? Authorizing Provider  acetaminophen (TYLENOL) 500 MG tablet Take 1-2 tablets (500-1,000 mg total) by mouth every 6 (six) hours as needed for mild pain. 07/13/15   Tonia Ghent, MD  amLODipine (NORVASC) 2.5 MG tablet Take 2.5 mg by mouth daily. 08/23/15   Historical Provider, MD  atorvastatin (LIPITOR) 10 MG tablet Take 10 mg by mouth daily at 6 PM.     Historical Provider, MD  Buprenorphine (BUTRANS) 7.5 MCG/HR PTWK Place 1 patch onto the skin every 7 (seven) days. 09/07/15   Tonia Ghent, MD  clopidogrel (PLAVIX) 75 MG tablet Take 1 tablet (75 mg total) by mouth daily. 12/11/14   Rosalin Hawking, MD  donepezil (ARICEPT) 10 MG tablet TAKE 1 TABLET BY MOUTH DAILY AT BEDTIME 07/13/15   Tonia Ghent, MD  ferrous sulfate 325 (65 FE) MG tablet TAKE 1 TABLET BY MOUTH 2 TIMES DAILY WITH A MEAL Patient taking differently: TAKE 1 TABLET BY MOUTH 1 TIMES DAILY WITH A MEAL 08/06/15   Tonia Ghent, MD  fluticasone (FLONASE) 50 MCG/ACT nasal spray Place 1-2 sprays into both nostrils daily. Patient taking differently: Place 1-2 sprays into both  nostrils daily as needed for allergies.  03/16/15   Tonia Ghent, MD  hydrocortisone (ANUSOL-HC) 2.5 % rectal cream Apply rectally 2 times daily Patient taking differently: Place 1 application rectally 2 (two) times daily as needed for hemorrhoids.  07/13/15 07/12/16  Tonia Ghent, MD  montelukast (SINGULAIR) 10 MG tablet Take 10 mg by mouth at bedtime.  02/21/13   Historical Provider, MD  omeprazole (PRILOSEC) 20 MG capsule TAKE 1 CAPSULE TWICE DAILY FOR 10 DAYS, THEN DECREASE TO NORMAL ONCE DAILY DOSING Patient taking differently: Take one tablet by mouth nightly. 05/04/15   Tonia Ghent, MD  polyvinyl alcohol (LIQUIFILM TEARS) 1.4 % ophthalmic solution Place 1 drop into both eyes daily as needed for dry eyes (allergies).    Historical Provider, MD  PRESCRIPTION MEDICATION  Inject 1 each into the skin. Supportive therapy [place] CC-infusion    Historical Provider, MD  SYMBICORT 160-4.5 MCG/ACT inhaler INHALE 2 PUFFS INTO THE LUNGS 2 (TWO) TIMES DAILY AS NEEDED (SHORTNESS OF BREATH). 08/23/15   Tonia Ghent, MD  traMADol (ULTRAM) 50 MG tablet Take 1 tablet (50 mg total) by mouth every 6 (six) hours as needed. 09/21/15   Duanne Guess, PA-C  vitamin E 400 UNIT capsule Take 400 Units by mouth daily.    Historical Provider, MD   BP 160/97 mmHg  Pulse 84  Temp(Src) 98 F (36.7 C) (Oral)  Resp 18  Ht 5\' 7"  (1.702 m)  Wt 73.483 kg  BMI 25.37 kg/m2  SpO2 96% Physical Exam  Constitutional: She is oriented to person, place, and time. She appears well-developed and well-nourished. No distress.  HENT:  Head: Normocephalic and atraumatic.  Mouth/Throat: Oropharynx is clear and moist.  Eyes: EOM are normal. Pupils are equal, round, and reactive to light. Right eye exhibits no discharge. Left eye exhibits no discharge.  Neck: Normal range of motion. Neck supple.  Cardiovascular: Normal rate, regular rhythm and intact distal pulses.   Pulmonary/Chest: Effort normal and breath sounds normal. No  respiratory distress. She has no wheezes.  Musculoskeletal:  Cervical Spine: Examination of the cervical spine reveals no bony abnormality, no edema, and no ecchymosis.  There is no step-off.  The patient has decreased active and passive range of motion of the cervical spine with flexion, extension, and right and left bend with rotation.  There is no crepitus with range of motion exercises.  The patient is non-tender along the spinous process to palpation.  The patient has mild paravertebral muscle tenderness to palpation.  There is no parascapular discomfort.  The patient has a negative axial compression test.  The patient has a painful Spurling test.  The patient has a negative overhead arm test for thoracic outlet syndrome.    Bilateral Upper Extremity: Examination of the bilateral shoulder and arms showed no bony abnormality or edema.  The patient has normal active and passive motion with abduction, flexion, internal rotation, and external rotation.  The patient has no tenderness with motion.  The patient has a negative Hawkins test and a negative impingement test.  The patient has a negative drop arm test.  The patient is non-tender along the deltoid muscle.  There is no subacromial space tenderness with no AC joint tenderness.  The patient has no instability of the shoulder with anterior-posterior motion.  There is a negative sulcus sign.  The rotator cuff muscle strength is 5/5 with supraspinatus, 5/5 with internal rotation, and 5/5 with external rotation.  There is no crepitus with range of motion activities.     Neurological: She is alert and oriented to person, place, and time. She has normal reflexes.  Skin: Skin is warm and dry.  Psychiatric: She has a normal mood and affect. Her behavior is normal. Thought content normal.    ED Course  Procedures (including critical care time) Labs Review Labs Reviewed - No data to display  Imaging Review No results found. I have personally reviewed  and evaluated these images and lab results as part of my medical decision-making.   EKG Interpretation None      MDM   Final diagnoses:  Chronic cervical radiculopathy    69 year old female with chronic neck pain with bilateral cervical radiculopathy. Patient denies any new symptoms but states she has had a flareup in her chronic pain due  to the cold weather today. She has had no relief with Tylenol and Aleve. She has tolerated tramadol in the past, denies any allergies to this medication. She is given a Toradol injection, 30 mg IM. She is also given tramadol 50 mg by mouth. She is given a prescription for tramadol 50 mg by mouth every 6 hours as needed for pain. Recommend that she continue with Tylenol and Aleve as needed for pain. Follow-up with PCP or orthopedic/pain doctor. Return to the ER for any worsening symptoms urgent changes in health.    Duanne Guess, PA-C 09/21/15 1935  Harvest Dark, MD 09/21/15 2226

## 2015-09-22 ENCOUNTER — Telehealth: Payer: Self-pay | Admitting: Family Medicine

## 2015-09-22 NOTE — Telephone Encounter (Signed)
Thank you for also attempting to contact his daughter.

## 2015-09-22 NOTE — Telephone Encounter (Signed)
Call husband AND daughter.  Was seen in ER, per records, was given rx for tramadol.  She should avoid that med, would stop med.  I have d/w husband prev.  Thanks.

## 2015-09-22 NOTE — Telephone Encounter (Signed)
Patient's husband notified as instructed by telephone and verbalized understanding. Patient's husband stated that when patient went to the ER she was given a shot and tramadol and he has given her one since she has gotten home. Patient's husband stated that it is going to be hard not to give her the tramadol because of the pain that she is having. Mr. Cardinali stated that when he gave her the tramadol it did not help much with her pain and stated that since he can not give her the tramadol he will take her back to the ER if she continues with the pain. Left message on voicemail for patient's daughter to call back.

## 2015-09-26 ENCOUNTER — Encounter: Payer: Self-pay | Admitting: Family Medicine

## 2015-09-27 ENCOUNTER — Encounter: Payer: Self-pay | Admitting: Neurology

## 2015-09-27 ENCOUNTER — Ambulatory Visit (INDEPENDENT_AMBULATORY_CARE_PROVIDER_SITE_OTHER): Payer: Medicare Other | Admitting: Neurology

## 2015-09-27 VITALS — BP 135/84 | HR 95 | Ht 67.0 in | Wt 161.6 lb

## 2015-09-27 DIAGNOSIS — R569 Unspecified convulsions: Secondary | ICD-10-CM

## 2015-09-27 DIAGNOSIS — D509 Iron deficiency anemia, unspecified: Secondary | ICD-10-CM | POA: Diagnosis not present

## 2015-09-27 DIAGNOSIS — G894 Chronic pain syndrome: Secondary | ICD-10-CM | POA: Diagnosis not present

## 2015-09-27 DIAGNOSIS — F1027 Alcohol dependence with alcohol-induced persisting dementia: Secondary | ICD-10-CM

## 2015-09-27 DIAGNOSIS — F1097 Alcohol use, unspecified with alcohol-induced persisting dementia: Secondary | ICD-10-CM | POA: Diagnosis not present

## 2015-09-27 DIAGNOSIS — I63433 Cerebral infarction due to embolism of bilateral posterior cerebral arteries: Secondary | ICD-10-CM | POA: Diagnosis not present

## 2015-09-27 DIAGNOSIS — I6322 Cerebral infarction due to unspecified occlusion or stenosis of basilar arteries: Secondary | ICD-10-CM

## 2015-09-27 NOTE — Progress Notes (Signed)
STROKE NEUROLOGY FOLLOW UP NOTE  NAME: Aimee Jones DOB: 1946-09-06  REASON FOR VISIT: stroke follow up HISTORY FROM: husband and chart  Today we had the pleasure of seeing Aimee Jones in follow-up at our Neurology Clinic. Pt was accompanied by husband.   History Summary Aimee Jones is an 69 y.o. female with history significant for HLD, HTN, CKD, mild dementia, s/p right shoulder surgery 07/2014 was admitted for acute onset of right face droop, hands numbness, dysarthria. CT brain showed no acute abnormality. MRI showed bilateral punctate PCA/occipital infarcts, consistent with embolic pattern. Stroke workup including MRA, carotid Doppler, 2-D echo, lower extremity venous Doppler all negative. TEE was done showing small PFO, and loop recorder was placed. She was discharged with aspirin and Lipitor.  Follow up 12/11/14 - the patient has been doing stable. She can back to ED on 10/23/2014 for slurry speech right facial drooping and right tongue deviation. Symptoms resolved in ER. MRI did not show acute stroke. Considering TIA at that time. So far her loop recorder did not show any atrial per patient episode. She still has right shoulder pain on brace, and recently she started to have left shoulder pain, which she is going to undergo injections and possible MRI for a further evaluation with her orthopedic surgeon. She is also going to have sleep study to rule out sleep apnea ordered by her primary care doctor. Her blood pressure 116/72 in clinic today. She has been follow-up with Dr. Carles Collet in [place] neurolog[facility].  Follow up 03/18/15 - the patient has been doing well. Bilateral shoulder pain getting better but found out to have neck pain which may be the cause of bilateral shoulder pain. She is scheduled to have neck injection soon and would like to be cleared with stopping plavix for 5 days prior to procedure.   Interval History During the interval time, the pt has been doing well from  stroke standpoint. No recurrent stroke like symptoms. She has several visit for her anemia, shoulder pain and diarrhea during the interval time. She is scheduled this Thursday for ablation therapy for shoulder pain. She still has cognitive impairment but seems stable. BP today 135/89. She is on plavix and lipitor.  REVIEW OF SYSTEMS: Full 14 system review of systems performed and notable only for those listed below and in HPI above, all others are negative:  Constitutional:   Cardiovascular:  Ear/Nose/Throat:  ringing in ears Skin:  Eyes:  Eye itching, light sensitivity Respiratory:   Gastroitestinal: Nausea, diarrhea, black stool due to iron pills Genitourinary:  Hematology/Lymphatic:  Easy bruising Endocrine: cold intolerance Musculoskeletal:  Joint pain, aching muscles, neck pain, neck stiffness Allergy/Immunology:  Allergies Neurological:  Memory loss, seizure Psychiatric: confusion, nervous/anxious, depression, decreased concentration Sleep: daytime sleepiness  The following represents the patient's updated allergies and side effects list: Allergies  Allergen Reactions  . Tramadol Other (See Comments)    Would avoid.  H/o SZ .  Marland Kitchen Latex Rash  . Penicillins Rash    .Marland KitchenHas patient had a PCN reaction causing immediate rash, facial/tongue/throat swelling, SOB or lightheadedness with hypotension: No Has patient had a PCN reaction causing severe rash involving mucus membranes or skin necrosis: No Has patient had a PCN reaction that required hospitalization No Has patient had a PCN reaction occurring within the last 10 years: No If all of the above answers are "NO", then may proceed with Cephalosporin use.      The neurologically relevant items on the patient's problem list  were reviewed on today's visit.  Neurologic Examination  A problem focused neurological exam (12 or more points of the single system neurologic examination, vital signs counts as 1 point, cranial nerves count  for 8 points) was performed.  Blood pressure 135/84, pulse 95, height 5\' 7"  (1.702 m), weight 161 lb 9.6 oz (73.301 kg).  General - Well nourished, well developed, in no apparent distress.  Ophthalmologic - Sharp disc margins OU.  Cardiovascular - Regular rate and rhythm with no murmur.  Mental Status -  Level of arousal and orientation to time, place, and person were intact. Language including expression, repetition, comprehension was assessed and found intact. However, naming 4/6 and not able to follow complex 2-step commands. Attention span and concentration were partially impaired, did not spell world right but able to calculate. Recent and remote memory were 3/3 registration, 0/3 delayed recall. Fund of Knowledge was assessed and was impaired.  Cranial Nerves II - XII - II - Visual field intact OU. III, IV, VI - Extraocular movements intact. V - Facial sensation intact bilaterally. VII - Facial movement intact bilaterally. VIII - Hearing & vestibular intact bilaterally. X - Palate elevates symmetrically. XI - Chin turning & shoulder shrug intact bilaterally. XII - Tongue protrusion intact.  Motor Strength - The patient's strength was normal in all extremities.  Bulk was normal and fasciculations were absent.   Motor Tone - Muscle tone was assessed at the neck and appendages and was normal.  Reflexes - The patient's reflexes were normal in all extremities and she had no pathological reflexes.  Sensory - Light touch, temperature/pinprick were assessed and were normal.    Coordination - The patient had normal movements in the hands and feet with no ataxia or dysmetria.  Tremor was absent.  Gait and Station - The patient's transfers, posture, gait, station, and turns were observed as normal.  Data reviewed: I personally reviewed the images and agree with the radiology interpretations.  Ct Head Wo Contrast  10/12/2014 Stable atrophy and chronic small vessel ischemic  change. No evidence of acute intracranial abnormality.   Mr Brain Wo Contrast  10/12/2014 Small areas of acute cortical infarct in the occipital lobe bilaterally, probable emboli. Atrophy and chronic microvascular ischemic changes are stable from the prior MRI   Mr [facility] Contrast  10/12/2014 Negative MRA head  Dg Chest 2 View  10/12/2014 Left base atelectasis. Elsewhere lungs clear.   CUS - Bilateral: 1-39% ICA stenosis. Vertebral artery flow is antegrade.  LE venous doppler - negative for DVT  UE venous doppler - negative for DVT  2D ehco - Left ventricle: The cavity size was normal. Wall thickness was increased in a pattern of mild LVH. Systolic function was normal. The estimated ejection fraction was in the range of 55% to 60%. Wall motion was normal; there were no regional wall motion abnormalities. Doppler parameters are consistent with abnormal left ventricular relaxation (grade 1 diastolic dysfunction). - Aortic valve: Valve area (VTI): 1.51 cm^2. Valve area (Vmax): 1.84 cm^2. Valve area (Vmean): 1.41 cm^2. Impressions: - No cardiac source of emboli was indentified.  2D echo - 06/2015 - EF 55-60%  Loop recorder - no AF episodes  Component     Latest Ref Rng 10/13/2014  Cholesterol     0 - 200 mg/dL 142  Triglycerides     <150 mg/dL 180 (H)  HDL     >39 mg/dL 34 (L)  Total CHOL/HDL Ratio      4.2  VLDL  0 - 40 mg/dL 36  LDL (calc)     0 - 99 mg/dL 72  Hemoglobin A1C     <5.7 % 5.7 (H)  Mean Plasma Glucose     <117 mg/dL 117 (H)   Assessment: As you may recall, she is a 69 y.o. Caucasian female with PMH of HLD, HTN, CKD, mild dementia, s/p right shoulder surgery 07/2014 was admitted for bilateral punctate PCA/occipital infarcts, consistent with embolic pattern. Stroke workup including MRA, carotid Doppler, 2-D echo, LE DVT all negative. TEE was done showing small PFO, and loop recorder was placed and no AF episodes found so  far. Had questionable TIA episode 10/2014, UE venoud doppler negative for DVT and ASA changed to plavix. During the interval time, no stroke symptoms. Still has regular follow up for b/l shoulder pain, anemia, cognitive impairment and seizure.   Plan:  - continue plavix and lipitor stroke prevention - Follow up with your primary care physician for stroke risk factor modification. Recommend maintain blood pressure goal <130/80, diabetes with hemoglobin A1c goal below 6.5% and lipids with LDL cholesterol goal below 70 mg/dL.  - check BP at home - Follow up with pain management for shoulder pain. We recommend to bridge with ASA 81mg  for the 5 days without plavix. - continue to follow up with Dr. Carles Collet in [place] for memory difficulty and seizure - follow up as needed.   No orders of the defined types were placed in this encounter.    No orders of the defined types were placed in this encounter.    Patient Instructions  - continue plavix and lipitor stroke prevention - continue to monitor loop recorder - Follow up with your primary care physician for stroke risk factor modification. Recommend maintain blood pressure goal <130/80, diabetes with hemoglobin A1c goal below 6.5% and lipids with LDL cholesterol goal below 70 mg/dL.  - check BP at home - Follow up with pain management for shoulder pain. We recommend to bridge with ASA 81mg  for the 5 days without plavix. - continue to follow up with Dr. Carles Collet in [place] for memory difficulty and seizure - follow up as needed.    Rosalin Hawking, MD PhD [facility] [address] [place], [place] [address] [phone]

## 2015-09-27 NOTE — Patient Instructions (Addendum)
-   continue plavix and lipitor stroke prevention - continue to monitor loop recorder - Follow up with your primary care physician for stroke risk factor modification. Recommend maintain blood pressure goal <130/80, diabetes with hemoglobin A1c goal below 6.5% and lipids with LDL cholesterol goal below 70 mg/dL.  - check BP at home - Follow up with pain management for shoulder pain. We recommend to bridge with ASA 81mg  for the 5 days without plavix. - continue to follow up with Dr. Carles Collet in [place] for memory difficulty and seizure - follow up as needed.

## 2015-09-28 ENCOUNTER — Encounter: Payer: Self-pay | Admitting: Internal Medicine

## 2015-09-28 ENCOUNTER — Encounter: Payer: Self-pay | Admitting: *Deleted

## 2015-09-28 DIAGNOSIS — J3081 Allergic rhinitis due to animal (cat) (dog) hair and dander: Secondary | ICD-10-CM | POA: Diagnosis not present

## 2015-09-28 DIAGNOSIS — J301 Allergic rhinitis due to pollen: Secondary | ICD-10-CM | POA: Diagnosis not present

## 2015-09-28 DIAGNOSIS — J3089 Other allergic rhinitis: Secondary | ICD-10-CM | POA: Diagnosis not present

## 2015-09-30 DIAGNOSIS — M47812 Spondylosis without myelopathy or radiculopathy, cervical region: Secondary | ICD-10-CM | POA: Diagnosis not present

## 2015-10-01 ENCOUNTER — Other Ambulatory Visit: Payer: Self-pay | Admitting: Primary Care

## 2015-10-01 ENCOUNTER — Ambulatory Visit (INDEPENDENT_AMBULATORY_CARE_PROVIDER_SITE_OTHER): Payer: Medicare Other | Admitting: Primary Care

## 2015-10-01 ENCOUNTER — Encounter: Payer: Self-pay | Admitting: Primary Care

## 2015-10-01 VITALS — BP 148/82 | HR 80 | Temp 97.7°F | Ht 67.0 in | Wt 159.8 lb

## 2015-10-01 DIAGNOSIS — I6322 Cerebral infarction due to unspecified occlusion or stenosis of basilar arteries: Secondary | ICD-10-CM

## 2015-10-01 DIAGNOSIS — R300 Dysuria: Secondary | ICD-10-CM | POA: Diagnosis not present

## 2015-10-01 LAB — POCT URINALYSIS DIPSTICK
BILIRUBIN UA: NEGATIVE
Blood, UA: NEGATIVE
GLUCOSE UA: NEGATIVE
LEUKOCYTES UA: NEGATIVE
NITRITE UA: NEGATIVE
PH UA: 5.5
Spec Grav, UA: >=1.03
Urobilinogen, UA: NEGATIVE

## 2015-10-01 NOTE — Progress Notes (Signed)
Pre visit review using our clinic review tool, if applicable. No additional management support is needed unless otherwise documented below in the visit note. 

## 2015-10-01 NOTE — Patient Instructions (Addendum)
Your urine does not show evidence of infection. I will send it off for culture.  I've sent off your vaginal specimen to check for bacteria and yeast.  Increase consumption of fluids over the next several days.  It was a pleasure to see you today!

## 2015-10-01 NOTE — Progress Notes (Signed)
Subjective:    Patient ID: Aimee Jones, female    DOB: 03/28/1946, 69 y.o.   MRN: [id number]  HPI  Aimee Jones is a 69 year old female who presents today with a chief complaint of dysuria. Denies hematuria, increased frequency, abdominal pain, fevers. Her dysuria has been present for the past 5 days. She's not taken anything OTC for her urinary symptoms.  Review of Systems  Constitutional: Negative for fever and chills.  Genitourinary: Positive for dysuria and difficulty urinating. Negative for frequency, hematuria and vaginal discharge.       Past Medical History  Diagnosis Date  . Hyperlipidemia   . ETOH abuse   . Allergy   . Diverticulosis   . Hypertension   . Depression   . Asthma   . GERD (gastroesophageal reflux disease)   . Normal 24 hour ambulatory pH monitoring study     good acid suppression  . Dementia   . Shortness of breath   . Anxiety     panic attacks- on occas.   . Arthritis     osteoporosis - especially hips.   . Stroke ([place])   . Memory loss   . Confusion   . Hyponatremia 07/05/2015  . Observed seizure-like activity ([place]) 07/05/2015    observed by husband , Dr Doy Mince  . [place]     Social History   Social History  . Marital Status: Married    Spouse Name: N/A  . Number of Children: N/A  . Years of Education: N/A   Occupational History  . retired     [employer]   Social History Main Topics  . Smoking status: Never Smoker   . Smokeless tobacco: Never Used  . Alcohol Use: No  . Drug Use: No  . Sexual Activity: Yes   Other Topics Concern  . Not on file   Social History Narrative   Married. Retired.      Past Surgical History  Procedure Laterality Date  . Dobutamine stress echo  07/09/2001    normal  . Esophagogastroduodenoscopy  06/11/2006    normal  . Abdominal hysterectomy    . Total hip arthroplasty      left 1991/right 2008  . Nasal sinus surgery  1990  . Eye surgery      cataracts removed, IOL- both eyes   . Breast  surgery      3x- biposies   . Shoulder arthroscopy Left   . Reverse shoulder arthroplasty Right 08/13/2014    Procedure: REVERSE SHOULDER ARTHROPLASTY;  Surgeon: Marin Shutter, MD;  Location: [facility];  Service: Orthopedics;  Laterality: Right;  interscalene block  . Joint replacement      bilateral hips  . Loop recorder implant N/A 10/14/2014    Procedure: LOOP RECORDER IMPLANT;  Surgeon: Evans Lance, MD;  Location[facility] C[facility] LAB;  Service: Cardiovascular;  Laterality: N/A;  . Tee without cardioversion N/A 10/14/2014    Procedure: TRANSESOPHAGEAL ECHOCARDIOGRAM (TEE);  Surgeon: Sueanne Margarita, MD;  Location[facility] ENDOSCOPY;  Service: Cardiovascular;  Laterality: N/A;  . Esophagogastroduodenoscopy (egd) with propofol N/A 08/05/2015    Procedure: ESOPHAGOGASTRODUODENOSCOPY (EGD) WITH PROPOFOL;  Surgeon: [name], MD;  Location: [facility] ENDOSCOPY;  Service: Endoscopy;  Laterality: N/A;  . Colonoscopy with propofol N/A 08/05/2015    Procedure: COLONOSCOPY WITH PROPOFOL;  Surgeon: [name], MD;  Location: [facility] ENDOSCOPY;  Service: Endoscopy;  Laterality: N/A;    Family History  Problem Relation Age of Onset  .  Sudden death Brother   . Heart attack Brother   . Hypertension Brother   . Esophageal cancer Brother   . Heart attack Father   . Heart block Mother   . Stroke Brother   . Stroke Mother     Allergies  Allergen Reactions  . Tramadol Other (See Comments)    Would avoid.  H/o SZ .  Marland Kitchen Latex Rash  . Penicillins Rash    .Marland KitchenHas patient had a PCN reaction causing immediate rash, facial/tongue/throat swelling, SOB or lightheadedness with hypotension: No Has patient had a PCN reaction causing severe rash involving mucus membranes or skin necrosis: No Has patient had a PCN reaction that required hospitalization No Has patient had a PCN reaction occurring within the last 10 years: No If all of the above answers are "NO", then may proceed with Cephalosporin use.      Current  Outpatient Prescriptions on File Prior to Visit  Medication Sig Dispense Refill  . acetaminophen (TYLENOL) 500 MG tablet Take 1-2 tablets (500-1,000 mg total) by mouth every 6 (six) hours as needed for mild pain. 30 tablet   . amLODipine (NORVASC) 2.5 MG tablet Take 2.5 mg by mouth as needed.   0  . atorvastatin (LIPITOR) 10 MG tablet Take 10 mg by mouth daily at 6 PM.     . Buprenorphine (BUTRANS) 7.5 MCG/HR PTWK Place 1 patch onto the skin every 7 (seven) days.    . clopidogrel (PLAVIX) 75 MG tablet Take 1 tablet (75 mg total) by mouth daily. 90 tablet 3  . donepezil (ARICEPT) 10 MG tablet TAKE 1 TABLET BY MOUTH DAILY AT BEDTIME 90 tablet 1  . ferrous sulfate 325 (65 FE) MG tablet TAKE 1 TABLET BY MOUTH 2 TIMES DAILY WITH A MEAL (Patient taking differently: TAKE 1 TABLET BY MOUTH 1 TIMES DAILY WITH A MEAL) 90 tablet 0  . fluticasone (FLONASE) 50 MCG/ACT nasal spray Place 1-2 sprays into both nostrils daily. (Patient taking differently: Place 1-2 sprays into both nostrils daily as needed for allergies. ) 16 g 6  . hydrocortisone (ANUSOL-HC) 2.5 % rectal cream Apply rectally 2 times daily (Patient taking differently: Place 1 application rectally 2 (two) times daily as needed for hemorrhoids. ) 30 g 0  . montelukast (SINGULAIR) 10 MG tablet Take 10 mg by mouth at bedtime.     Marland Kitchen omeprazole (PRILOSEC) 20 MG capsule TAKE 1 CAPSULE TWICE DAILY FOR 10 DAYS, THEN DECREASE TO NORMAL ONCE DAILY DOSING (Patient taking differently: Take one tablet by mouth nightly.) 30 capsule 5  . polyvinyl alcohol (LIQUIFILM TEARS) 1.4 % ophthalmic solution Place 1 drop into both eyes daily as needed for dry eyes (allergies).    Marland Kitchen PRESCRIPTION MEDICATION Inject 1 each into the skin. Supportive therapy [place] CC-infusion    . SYMBICORT 160-4.5 MCG/ACT inhaler INHALE 2 PUFFS INTO THE LUNGS 2 (TWO) TIMES DAILY AS NEEDED (SHORTNESS OF BREATH). 10.2 Inhaler 7  . vitamin E 400 UNIT capsule Take 400 Units by mouth daily.    . traMADol  (ULTRAM) 50 MG tablet Take 1 tablet (50 mg total) by mouth every 6 (six) hours as needed. (Patient not taking: Reported on 10/01/2015) 20 tablet 0   No current facility-administered medications on file prior to visit.    BP 148/82 mmHg  Pulse 80  Temp(Src) 97.7 F (36.5 C) (Oral)  Ht 5\' 7"  (1.702 m)  Wt 159 lb 12.8 oz (72.485 kg)  BMI 25.02 kg/m2  SpO2 96%    Objective:  Physical Exam  Constitutional: She appears well-nourished.  Cardiovascular: Normal rate and regular rhythm.   Pulmonary/Chest: Effort normal and breath sounds normal.  Abdominal: There is no CVA tenderness.  Genitourinary: Cervix exhibits no motion tenderness and no discharge.  Erythema/irritation to vaginal walls, no discharge.  Skin: Skin is warm and dry.          Assessment & Plan:  Dysuria:  Present for 5 days. No vaginal symptoms, urinary frequency, fevers. History of Dementia, husband with her during visit. UA: Negative for leuks, nitrites, blood. Also sent culture due to discomfort. Wet prep sent off for testing. Exam with redness to vaginal walls, suspect this may be irration due to dry vaginal cavity. Suggested vaginal moisturizers. Otherwise exam unremarkable. Will wait for labs.

## 2015-10-02 LAB — WET PREP BY MOLECULAR PROBE
Candida species: NEGATIVE
Gardnerella vaginalis: POSITIVE — AB
TRICHOMONAS VAG: NEGATIVE

## 2015-10-03 ENCOUNTER — Emergency Department
Admission: EM | Admit: 2015-10-03 | Discharge: 2015-10-03 | Disposition: A | Discharge status: Home / Self Care | Payer: Medicare Other | Attending: Emergency Medicine | Admitting: Emergency Medicine

## 2015-10-03 DIAGNOSIS — F329 Major depressive disorder, single episode, unspecified: Secondary | ICD-10-CM | POA: Insufficient documentation

## 2015-10-03 DIAGNOSIS — M25511 Pain in right shoulder: Secondary | ICD-10-CM | POA: Diagnosis present

## 2015-10-03 DIAGNOSIS — Z880 Allergy status to penicillin: Secondary | ICD-10-CM | POA: Insufficient documentation

## 2015-10-03 DIAGNOSIS — M5412 Radiculopathy, cervical region: Secondary | ICD-10-CM

## 2015-10-03 DIAGNOSIS — Z79899 Other long term (current) drug therapy: Secondary | ICD-10-CM | POA: Diagnosis not present

## 2015-10-03 DIAGNOSIS — G8929 Other chronic pain: Secondary | ICD-10-CM | POA: Diagnosis not present

## 2015-10-03 DIAGNOSIS — Z91040 Latex allergy status: Secondary | ICD-10-CM | POA: Diagnosis not present

## 2015-10-03 DIAGNOSIS — I129 Hypertensive chronic kidney disease with stage 1 through stage 4 chronic kidney disease, or unspecified chronic kidney disease: Secondary | ICD-10-CM | POA: Diagnosis not present

## 2015-10-03 DIAGNOSIS — E785 Hyperlipidemia, unspecified: Secondary | ICD-10-CM | POA: Diagnosis not present

## 2015-10-03 DIAGNOSIS — F0390 Unspecified dementia without behavioral disturbance: Secondary | ICD-10-CM | POA: Insufficient documentation

## 2015-10-03 DIAGNOSIS — F419 Anxiety disorder, unspecified: Secondary | ICD-10-CM | POA: Diagnosis not present

## 2015-10-03 DIAGNOSIS — N183 Chronic kidney disease, stage 3 (moderate): Secondary | ICD-10-CM | POA: Diagnosis not present

## 2015-10-03 MED ORDER — TRAMADOL HCL 50 MG PO TABS
50.0000 mg | ORAL_TABLET | Freq: Once | ORAL | Status: AC
  Administered 2015-10-03: 50 mg via ORAL
  Filled 2015-10-03: qty 1

## 2015-10-03 MED ORDER — KETOROLAC TROMETHAMINE 60 MG/2ML IM SOLN
60.0000 mg | Freq: Once | INTRAMUSCULAR | Status: AC
  Administered 2015-10-03: 60 mg via INTRAMUSCULAR
  Filled 2015-10-03: qty 2

## 2015-10-03 MED ORDER — TRAMADOL HCL 50 MG PO TABS: 50.0000 mg | ORAL_TABLET | Freq: Four times a day (QID) | ORAL | Status: DC | PRN

## 2015-10-03 NOTE — ED Notes (Signed)
Pt c/o pain across the top of her shoulders; known pinched nerve in cervical spine; is followed by 2 different pain specialists; all opiates were stopped in September; pt had a block done on the 15th but is not getting any relief as of yet; husband was told it could take up to 6 weeks before she sees any relief but nothing prescribed in the meantime

## 2015-10-03 NOTE — Discharge Instructions (Signed)

## 2015-10-03 NOTE — ED Provider Notes (Signed)
[facility] Emergency Department Provider Note  ____________________________________________  Time seen: Approximately 9:06 PM  I have reviewed the triage vital signs and the nursing notes.   HISTORY  Chief Complaint Shoulder Pain    HPI Aimee Jones is a 69 y.o. female patient here today for bilateral shoulder pain secondary to cervical radiculopathy. Patient had a cervical block on 09/30/2015 that is not taken effect. Patient advised to be up to 6 weeks before she knows any significant improvement by decrease of pain. Patient was seen on 09/21/2015 and remarked that she receive moderate relief with Toradol injection.Patient and husband are asked for repeated that medication. She denies any paresthesias associated with this complaint. Patient is not under pain management contract. Patient rates the patient is a 10 over 10. There was a prior history of opiate abuse and patient has been weaned off narcotic pain medications. No other palliative measures except for Tylenol taken for this complaint.   Past Medical History  Diagnosis Date  . Hyperlipidemia   . ETOH abuse   . Allergy   . Diverticulosis   . Hypertension   . Depression   . Asthma   . GERD (gastroesophageal reflux disease)   . Normal 24 hour ambulatory pH monitoring study     good acid suppression  . Dementia   . Shortness of breath   . Anxiety     panic attacks- on occas.   . Arthritis     osteoporosis - especially hips.   . Stroke ([place])   . Memory loss   . Confusion   . Hyponatremia 07/05/2015  . Observed seizure-like activity ([place]) 07/05/2015    observed by husband , Dr Doy Mince  . Falls     Patient Active Problem List   Diagnosis Date Noted  . Convulsions ([place]) 09/27/2015  . Cerebrovascular accident (CVA) due to bilateral embolism of posterior cerebral arteries 09/27/2015  . Chronic pain syndrome 09/27/2015  . Elevated blood pressure 07/07/2015  . Yeast infection 07/06/2015   . Leukocytosis 07/05/2015  . Confusion   . Seizure-like activity ([place]) 07/04/2015  . Seizure ([place]) 07/04/2015  . Depression   . GI bleed 06/24/2015  . Frequent falls   . Narcotic abuse   . Benzodiazepine abuse   . Alcohol abuse   . 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 Generalized anxiety disorder   . Hypokalemia   . Hypomagnesemia   . Traumatic hematoma of left eyebrow   . Periorbital hematoma of left eye   . Anemia, iron deficiency   . Urinary retention   . Altered mental status 06/22/2015  . Fall 06/22/2015  . Iron deficiency anemia 06/03/2015  . Medicare annual wellness visit, subsequent 04/23/2015  . Advance care planning 04/23/2015  . Senile purpura ([place]) 04/23/2015  . Abnormal CBC 04/23/2015  . Neck pain 04/23/2015  . Bilateral arm pain 12/16/2014  . Sleep apnea 12/11/2014  . 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 CAP (community acquired pneumonia) 10/20/2014  . Stroke ([place])   . Cerebral thrombosis with cerebral infarction ([place]) 10/13/2014  . HLD (hyperlipidemia)   . Facial droop 10/12/2014  . CKD (chronic kidney disease), stage III 10/12/2014  . S/P shoulder replacement 08/13/2014  . Cough 06/22/2014  . Postmenopausal HRT (hormone replacement therapy) 05/31/2014  . Hyponatremia 05/28/2014  . Shoulder pain, bilateral 10/25/2012  . Parkinsonism ([place]) 08/22/2012  . MEMORY LOSS 06/22/2010  . ALCOHOL ABUSE 03/08/2010  . 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 OBSTRUCTIVE  CHRONIC BRONCHITIS WITH EXACERBATION 01/02/2008  . GLUCOSE INTOLERANCE 11/15/2007  . GERD 07/25/2007  . OSTEOARTHRITIS 07/25/2007  . PANIC ATTACK 05/01/2007  . Depressive disorder, not elsewhere classified 05/01/2007  . Essential hypertension 05/01/2007  . ALLERGIC RHINITIS 05/01/2007  . ASTHMA 05/01/2007  . DIVERTICULOSIS, COLON 05/01/2007  . MENOPAUSAL DISORDER 05/01/2007  . FIBROMYALGIA 05/01/2007    Past Surgical History  Procedure Laterality  Date  . Dobutamine stress echo  07/09/2001    normal  . Esophagogastroduodenoscopy  06/11/2006    normal  . Abdominal hysterectomy    . Total hip arthroplasty      left 1991/right 2008  . Nasal sinus surgery  1990  . Eye surgery      cataracts removed, IOL- both eyes   . Breast surgery      3x- biposies   . Shoulder arthroscopy Left   . Reverse shoulder arthroplasty Right 08/13/2014    Procedure: REVERSE SHOULDER ARTHROPLASTY;  Surgeon: Marin Shutter, MD;  Location: [place];  Service: Orthopedics;  Laterality: Right;  interscalene block  . Joint replacement      bilateral hips  . Loop recorder implant N/A 10/14/2014    Procedure: LOOP RECORDER IMPLANT;  Surgeon: Evans Lance, MD;  Location: [facility] CATH LAB;  Service: Cardiovascular;  Laterality: N/A;  . Tee without cardioversion N/A 10/14/2014    Procedure: TRANSESOPHAGEAL ECHOCARDIOGRAM (TEE);  Surgeon: Sueanne Margarita, MD;  Location: [facility] ENDOSCOPY;  Service: Cardiovascular;  Laterality: N/A;  . Esophagogastroduodenoscopy (egd) with propofol N/A 08/05/2015    Procedure: ESOPHAGOGASTRODUODENOSCOPY (EGD) WITH PROPOFOL;  Surgeon: [name], MD;  Location: [facility] ENDOSCOPY;  Service: Endoscopy;  Laterality: N/A;  . Colonoscopy with propofol N/A 08/05/2015    Procedure: COLONOSCOPY WITH PROPOFOL;  Surgeon: [name], MD;  Location: [facility] ENDOSCOPY;  Service: Endoscopy;  Laterality: N/A;    Current Outpatient Rx  Name  Route  Sig  Dispense  Refill  . acetaminophen (TYLENOL) 500 MG tablet   Oral   Take 1-2 tablets (500-1,000 mg total) by mouth every 6 (six) hours as needed for mild pain.   30 tablet      . amLODipine (NORVASC) 2.5 MG tablet   Oral   Take 2.5 mg by mouth as needed.       0   . atorvastatin (LIPITOR) 10 MG tablet   Oral   Take 10 mg by mouth daily at 6 PM.          . Buprenorphine (BUTRANS) 7.5 MCG/HR PTWK   Transdermal   Place 1 patch onto the skin every 7 (seven) days.         . clopidogrel (PLAVIX) 75  MG tablet   Oral   Take 1 tablet (75 mg total) by mouth daily.   90 tablet   3   . donepezil (ARICEPT) 10 MG tablet      TAKE 1 TABLET BY MOUTH DAILY AT BEDTIME   90 tablet   1   . ferrous sulfate 325 (65 FE) MG tablet      TAKE 1 TABLET BY MOUTH 2 TIMES DAILY WITH A MEAL Patient taking differently: TAKE 1 TABLET BY MOUTH 1 TIMES DAILY WITH A MEAL   90 tablet   0   . fluticasone (FLONASE) 50 MCG/ACT nasal spray   Each Nare   Place 1-2 sprays into both nostrils daily. Patient taking differently: Place 1-2 sprays into both nostrils daily as needed for allergies.    16 g  6   . hydrocortisone (ANUSOL-HC) 2.5 % rectal cream      Apply rectally 2 times daily Patient taking differently: Place 1 application rectally 2 (two) times daily as needed for hemorrhoids.    30 g   0   . montelukast (SINGULAIR) 10 MG tablet   Oral   Take 10 mg by mouth at bedtime.          Marland Kitchen omeprazole (PRILOSEC) 20 MG capsule      TAKE 1 CAPSULE TWICE DAILY FOR 10 DAYS, THEN DECREASE TO NORMAL ONCE DAILY DOSING Patient taking differently: Take one tablet by mouth nightly.   30 capsule   5   . polyvinyl alcohol (LIQUIFILM TEARS) 1.4 % ophthalmic solution   Both Eyes   Place 1 drop into both eyes daily as needed for dry eyes (allergies).         Marland Kitchen PRESCRIPTION MEDICATION   Subcutaneous   Inject 1 each into the skin. Supportive therapy [place] CC-infusion         . SYMBICORT 160-4.5 MCG/ACT inhaler      INHALE 2 PUFFS INTO THE LUNGS 2 (TWO) TIMES DAILY AS NEEDED (SHORTNESS OF BREATH).   10.2 Inhaler   7   . traMADol (ULTRAM) 50 MG tablet   Oral   Take 1 tablet (50 mg total) by mouth every 6 (six) hours as needed. Patient not taking: Reported on 10/01/2015   20 tablet   0   . traMADol (ULTRAM) 50 MG tablet   Oral   Take 1 tablet (50 mg total) by mouth every 6 (six) hours as needed.   20 tablet   0   . vitamin E 400 UNIT capsule   Oral   Take 400 Units by mouth daily.            Allergies Tramadol; Latex; and Penicillins  Family History  Problem Relation Age of Onset  . Sudden death Brother   . Heart attack Brother   . Hypertension Brother   . Esophageal cancer Brother   . Heart attack Father   . Heart block Mother   . Stroke Brother   . Stroke Mother     Social History Social History  Substance Use Topics  . Smoking status: Never Smoker   . Smokeless tobacco: Never Used  . Alcohol Use: No    Review of Systems Constitutional: No fever/chills Eyes: No visual changes. ENT: No sore throat. Cardiovascular: Denies chest pain. Respiratory: Denies shortness of breath. Gastrointestinal: No abdominal pain.  No nausea, no vomiting.  No diarrhea.  No constipation. Genitourinary: Negative for dysuria. Musculoskeletal: Positive chronic neck pain.  Skin: Negative for rash. Neurological: Negative for headaches, focal weakness or numbness. Psychiatric:Depression, anxiety, and dementia. Endocrine:Hyperlipidemia and hypertension. Hematological/Lymphatic: Allergic/Immunilogical: Patient and husband state there is no allergy to tramadol. 10-point ROS otherwise negative.  ____________________________________________   PHYSICAL EXAM:  VITAL SIGNS: ED Triage Vitals  Enc Vitals Group     BP 10/03/15 2050 162/95 mmHg     Pulse Rate 10/03/15 2050 81     Resp 10/03/15 2050 20     Temp 10/03/15 2050 97.8 F (36.6 C)     Temp Source 10/03/15 2050 Oral     SpO2 10/03/15 2050 95 %     Weight 10/03/15 2050 158 lb (71.668 kg)     Height 10/03/15 2050 5\' 7"  (1.702 m)     Head Cir --      Peak Flow --  Pain Score 10/03/15 2050 10     Pain Loc --      Pain Edu? --      Excl. in [place]? --     Constitutional: Alert and oriented. Well appearing and in no acute distress. Eyes: Conjunctivae are normal. PERRL. EOMI. Head: Atraumatic. Nose: No congestion/rhinnorhea. Mouth/Throat: Mucous membranes are moist.  Oropharynx non-erythematous. Neck: No stridor.    cervical spine tenderness to palpation at C5 and C6. Patient decreased range of motion all fields. There was a negative ankle compression test but positive Spurling test. Hematological/Lymphatic/Immunilogical: No cervical lymphadenopathy. Cardiovascular: Normal rate, regular rhythm. Grossly normal heart sounds.  Good peripheral circulation. Elevated blood pressure Respiratory: Normal respiratory effort.  No retractions. Lungs CTAB. Gastrointestinal: Soft and nontender. No distention. No abdominal bruits. No CVA tenderness. Musculoskeletal: No lower extremity tenderness nor edema.  No joint effusions. Neurologic:  Normal speech and language. No gross focal neurologic deficits are appreciated. No gait instability. Skin:  Skin is warm, dry and intact. No rash noted. Psychiatric: Mood and affect are normal. Speech and behavior are normal.  ____________________________________________   LABS (all labs ordered are listed, but only abnormal results are displayed)  Labs Reviewed - No data to display ____________________________________________  EKG  Showed normal sinus rhythm. EKG read by heart station doctor. ____________________________________________  RADIOLOGY   ____________________________________________   PROCEDURES  Procedure(s) performed: None  Critical Care performed: No  ____________________________________________   INITIAL IMPRESSION / ASSESSMENT AND PLAN / ED COURSE  Pertinent labs & imaging results that were available during my care of the patient were reviewed by me and considered in my medical decision making (see chart for details).  Chronic neck pain with bilateral radiculopathy. _Patient given Toradol and tramadol in the ER. Patient discharged with prescription for tramadol. Patient advised to follow-up with family doctor for continued care. ___________________________________________   FINAL CLINICAL IMPRESSION(S) / ED DIAGNOSES  Final diagnoses:   Cervical radiculopathy      Sable Feil, PA-C 10/03/15 2117  Nance Pear, MD 10/03/15 2259

## 2015-10-04 ENCOUNTER — Other Ambulatory Visit: Payer: Self-pay | Admitting: Primary Care

## 2015-10-04 DIAGNOSIS — N760 Acute vaginitis: Principal | ICD-10-CM

## 2015-10-04 DIAGNOSIS — B9689 Other specified bacterial agents as the cause of diseases classified elsewhere: Secondary | ICD-10-CM

## 2015-10-04 LAB — URINE CULTURE: Colony Count: >=100000

## 2015-10-04 MED ORDER — METRONIDAZOLE 500 MG PO TABS
500.0000 mg | ORAL_TABLET | Freq: Two times a day (BID) | ORAL | Status: DC
Start: 2015-10-04 — End: 2015-11-01

## 2015-10-05 DIAGNOSIS — J3081 Allergic rhinitis due to animal (cat) (dog) hair and dander: Secondary | ICD-10-CM | POA: Diagnosis not present

## 2015-10-05 DIAGNOSIS — J3089 Other allergic rhinitis: Secondary | ICD-10-CM | POA: Diagnosis not present

## 2015-10-12 ENCOUNTER — Ambulatory Visit (INDEPENDENT_AMBULATORY_CARE_PROVIDER_SITE_OTHER): Payer: Medicare Other | Admitting: *Deleted

## 2015-10-12 DIAGNOSIS — I639 Cerebral infarction, unspecified: Secondary | ICD-10-CM

## 2015-10-12 DIAGNOSIS — J3089 Other allergic rhinitis: Secondary | ICD-10-CM | POA: Diagnosis not present

## 2015-10-12 DIAGNOSIS — J3081 Allergic rhinitis due to animal (cat) (dog) hair and dander: Secondary | ICD-10-CM | POA: Diagnosis not present

## 2015-10-12 NOTE — Progress Notes (Signed)
[facility] Summary Report / Loop Recorder 

## 2015-10-13 ENCOUNTER — Encounter: Payer: Self-pay | Admitting: Medical Oncology

## 2015-10-13 ENCOUNTER — Emergency Department
Admission: EM | Admit: 2015-10-13 | Discharge: 2015-10-13 | Disposition: A | Discharge status: Home / Self Care | Payer: Medicare Other | Attending: Emergency Medicine | Admitting: Emergency Medicine

## 2015-10-13 DIAGNOSIS — N183 Chronic kidney disease, stage 3 (moderate): Secondary | ICD-10-CM | POA: Diagnosis not present

## 2015-10-13 DIAGNOSIS — I129 Hypertensive chronic kidney disease with stage 1 through stage 4 chronic kidney disease, or unspecified chronic kidney disease: Secondary | ICD-10-CM | POA: Insufficient documentation

## 2015-10-13 DIAGNOSIS — Z7902 Long term (current) use of antithrombotics/antiplatelets: Secondary | ICD-10-CM | POA: Diagnosis not present

## 2015-10-13 DIAGNOSIS — Z91040 Latex allergy status: Secondary | ICD-10-CM | POA: Diagnosis not present

## 2015-10-13 DIAGNOSIS — M5412 Radiculopathy, cervical region: Secondary | ICD-10-CM

## 2015-10-13 DIAGNOSIS — Z880 Allergy status to penicillin: Secondary | ICD-10-CM | POA: Diagnosis not present

## 2015-10-13 DIAGNOSIS — Z79899 Other long term (current) drug therapy: Secondary | ICD-10-CM | POA: Diagnosis not present

## 2015-10-13 DIAGNOSIS — Z792 Long term (current) use of antibiotics: Secondary | ICD-10-CM | POA: Insufficient documentation

## 2015-10-13 DIAGNOSIS — Z7952 Long term (current) use of systemic steroids: Secondary | ICD-10-CM | POA: Diagnosis not present

## 2015-10-13 DIAGNOSIS — Z791 Long term (current) use of non-steroidal anti-inflammatories (NSAID): Secondary | ICD-10-CM | POA: Diagnosis not present

## 2015-10-13 DIAGNOSIS — M25519 Pain in unspecified shoulder: Secondary | ICD-10-CM | POA: Diagnosis present

## 2015-10-13 MED ORDER — MELOXICAM 15 MG PO TABS: 15.0000 mg | ORAL_TABLET | Freq: Every day | ORAL | Status: DC

## 2015-10-13 MED ORDER — KETOROLAC TROMETHAMINE 30 MG/ML IJ SOLN
60.0000 mg | Freq: Once | INTRAMUSCULAR | Status: AC
  Administered 2015-10-13: 60 mg via INTRAMUSCULAR
  Filled 2015-10-13: qty 2

## 2015-10-13 MED ORDER — TRAMADOL HCL 50 MG PO TABS: 50.0000 mg | ORAL_TABLET | Freq: Four times a day (QID) | ORAL | Status: DC | PRN

## 2015-10-13 NOTE — ED Notes (Signed)
Per family she is having pain from neck into shoulders w/o injury  Hx of fibromyaliga  Family states she takes aleve and tylenol w/o relief  today

## 2015-10-13 NOTE — Discharge Instructions (Signed)

## 2015-10-13 NOTE — ED Notes (Signed)
Pt to triage with reports that she has been having chronic neck pain with radiation of pain into both shoulders. Pt was seen here on the 18th and is due to go to chronic pain specialist tomorrow for injection but reports pain is unbearable. Pt only takes tylenol and aleve at home that is not helping.

## 2015-10-13 NOTE — ED Provider Notes (Signed)
[facility] Emergency Department Provider Note  ____________________________________________  Time seen: Approximately 4:07 PM  I have reviewed the triage vital signs and the nursing notes.   HISTORY  Chief Complaint Shoulder Pain    HPI Aimee Jones is a 69 y.o. female who presents emergency department for a chronic history of bilateral cervical radiculopathy. Patient is being followed by a spine specialist and has received multiple injections and is now undergoing ablation therapy. Patient is scheduled to see specialists tomorrow for ablation to right side. Patient is having increased pain and radicular symptoms bilaterally. There is no known injury leading to these symptoms. Patient has been taking Tylenol and Aleve at home. She does have a history of opioid abuse and takes tramadol on a very as-needed basis.   Past Medical History  Diagnosis Date  . Hyperlipidemia   . ETOH abuse   . Allergy   . Diverticulosis   . Hypertension   . Depression   . Asthma   . GERD (gastroesophageal reflux disease)   . Normal 24 hour ambulatory pH monitoring study     good acid suppression  . Dementia   . Shortness of breath   . Anxiety     panic attacks- on occas.   . Arthritis     osteoporosis - especially hips.   . Stroke ([place])   . Memory loss   . Confusion   . Hyponatremia 07/05/2015  . Observed seizure-like activity ([place]) 07/05/2015    observed by husband , Dr Doy Mince  . Falls     Patient Active Problem List   Diagnosis Date Noted  . Convulsions ([place]) 09/27/2015  . Cerebrovascular accident (CVA) due to bilateral embolism of posterior cerebral arteries 09/27/2015  . Chronic pain syndrome 09/27/2015  . Elevated blood pressure 07/07/2015  . Yeast infection 07/06/2015  . Leukocytosis 07/05/2015  . Confusion   . Seizure-like activity ([place]) 07/04/2015  . Seizure ([place]) 07/04/2015  . Depression   . GI bleed 06/24/2015  . Frequent falls   .  Narcotic abuse   . Benzodiazepine abuse   . Alcohol abuse   . 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 Generalized anxiety disorder   . Hypokalemia   . Hypomagnesemia   . Traumatic hematoma of left eyebrow   . Periorbital hematoma of left eye   . Anemia, iron deficiency   . Urinary retention   . Altered mental status 06/22/2015  . Fall 06/22/2015  . Iron deficiency anemia 06/03/2015  . Medicare annual wellness visit, subsequent 04/23/2015  . Advance care planning 04/23/2015  . Senile purpura ([place]) 04/23/2015  . Abnormal CBC 04/23/2015  . Neck pain 04/23/2015  . Bilateral arm pain 12/16/2014  . Sleep apnea 12/11/2014  . 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 CAP (community acquired pneumonia) 10/20/2014  . Stroke ([place])   . Cerebral thrombosis with cerebral infarction ([place]) 10/13/2014  . HLD (hyperlipidemia)   . Facial droop 10/12/2014  . CKD (chronic kidney disease), stage III 10/12/2014  . S/P shoulder replacement 08/13/2014  . Cough 06/22/2014  . Postmenopausal HRT (hormone replacement therapy) 05/31/2014  . Hyponatremia 05/28/2014  . Shoulder pain, bilateral 10/25/2012  . Parkinsonism ([place]) 08/22/2012  . MEMORY LOSS 06/22/2010  . ALCOHOL ABUSE 03/08/2010  . 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 OBSTRUCTIVE CHRONIC BRONCHITIS WITH EXACERBATION 01/02/2008  . GLUCOSE INTOLERANCE 11/15/2007  . GERD 07/25/2007  . OSTEOARTHRITIS 07/25/2007  . PANIC ATTACK 05/01/2007  . Depressive disorder, not elsewhere classified  05/01/2007  . Essential hypertension 05/01/2007  . ALLERGIC RHINITIS 05/01/2007  . ASTHMA 05/01/2007  . DIVERTICULOSIS, COLON 05/01/2007  . MENOPAUSAL DISORDER 05/01/2007  . FIBROMYALGIA 05/01/2007    Past Surgical History  Procedure Laterality Date  . Dobutamine stress echo  07/09/2001    normal  . Esophagogastroduodenoscopy  06/11/2006    normal  . Abdominal hysterectomy    . Total hip arthroplasty      left  1991/right 2008  . Nasal sinus surgery  1990  . Eye surgery      cataracts removed, IOL- both eyes   . Breast surgery      3x- biposies   . Shoulder arthroscopy Left   . Reverse shoulder arthroplasty Right 08/13/2014    Procedure: REVERSE SHOULDER ARTHROPLASTY;  Surgeon: Marin Shutter, MD;  Location: [place];  Service: Orthopedics;  Laterality: Right;  interscalene block  . Joint replacement      bilateral hips  . Loop recorder implant N/A 10/14/2014    Procedure: LOOP RECORDER IMPLANT;  Surgeon: Evans Lance, MD;  Location: [facility] CATH LAB;  Service: Cardiovascular;  Laterality: N/A;  . Tee without cardioversion N/A 10/14/2014    Procedure: TRANSESOPHAGEAL ECHOCARDIOGRAM (TEE);  Surgeon: Sueanne Margarita, MD;  Location: [facility] ENDOSCOPY;  Service: Cardiovascular;  Laterality: N/A;  . Esophagogastroduodenoscopy (egd) with propofol N/A 08/05/2015    Procedure: ESOPHAGOGASTRODUODENOSCOPY (EGD) WITH PROPOFOL;  Surgeon: [name], MD;  Location: [facility] ENDOSCOPY;  Service: Endoscopy;  Laterality: N/A;  . Colonoscopy with propofol N/A 08/05/2015    Procedure: COLONOSCOPY WITH PROPOFOL;  Surgeon: [name], MD;  Location: [facility] ENDOSCOPY;  Service: Endoscopy;  Laterality: N/A;    Current Outpatient Rx  Name  Route  Sig  Dispense  Refill  . acetaminophen (TYLENOL) 500 MG tablet   Oral   Take 1-2 tablets (500-1,000 mg total) by mouth every 6 (six) hours as needed for mild pain.   30 tablet      . amLODipine (NORVASC) 2.5 MG tablet   Oral   Take 2.5 mg by mouth as needed.       0   . atorvastatin (LIPITOR) 10 MG tablet   Oral   Take 10 mg by mouth daily at 6 PM.          . Buprenorphine (BUTRANS) 7.5 MCG/HR PTWK   Transdermal   Place 1 patch onto the skin every 7 (seven) days.         . clopidogrel (PLAVIX) 75 MG tablet   Oral   Take 1 tablet (75 mg total) by mouth daily.   90 tablet   3   . donepezil (ARICEPT) 10 MG tablet      TAKE 1 TABLET BY MOUTH DAILY AT BEDTIME   90  tablet   1   . ferrous sulfate 325 (65 FE) MG tablet      TAKE 1 TABLET BY MOUTH 2 TIMES DAILY WITH A MEAL Patient taking differently: TAKE 1 TABLET BY MOUTH 1 TIMES DAILY WITH A MEAL   90 tablet   0   . fluticasone (FLONASE) 50 MCG/ACT nasal spray   Each Nare   Place 1-2 sprays into both nostrils daily. Patient taking differently: Place 1-2 sprays into both nostrils daily as needed for allergies.    16 g   6   . hydrocortisone (ANUSOL-HC) 2.5 % rectal cream      Apply rectally 2 times daily Patient taking differently: Place 1 application rectally 2 (two)  times daily as needed for hemorrhoids.    30 g   0   . meloxicam (MOBIC) 15 MG tablet   Oral   Take 1 tablet (15 mg total) by mouth daily.   30 tablet   0   . metroNIDAZOLE (FLAGYL) 500 MG tablet   Oral   Take 1 tablet (500 mg total) by mouth 2 (two) times daily.   14 tablet   0   . montelukast (SINGULAIR) 10 MG tablet   Oral   Take 10 mg by mouth at bedtime.          Marland Kitchen omeprazole (PRILOSEC) 20 MG capsule      TAKE 1 CAPSULE TWICE DAILY FOR 10 DAYS, THEN DECREASE TO NORMAL ONCE DAILY DOSING Patient taking differently: Take one tablet by mouth nightly.   30 capsule   5   . polyvinyl alcohol (LIQUIFILM TEARS) 1.4 % ophthalmic solution   Both Eyes   Place 1 drop into both eyes daily as needed for dry eyes (allergies).         Marland Kitchen PRESCRIPTION MEDICATION   Subcutaneous   Inject 1 each into the skin. Supportive therapy [place] CC-infusion         . SYMBICORT 160-4.5 MCG/ACT inhaler      INHALE 2 PUFFS INTO THE LUNGS 2 (TWO) TIMES DAILY AS NEEDED (SHORTNESS OF BREATH).   10.2 Inhaler   7   . traMADol (ULTRAM) 50 MG tablet   Oral   Take 1 tablet (50 mg total) by mouth every 6 (six) hours as needed.   20 tablet   0   . vitamin E 400 UNIT capsule   Oral   Take 400 Units by mouth daily.           Allergies Tramadol; Latex; and Penicillins  Family History  Problem Relation Age of Onset  . Sudden  death Brother   . Heart attack Brother   . Hypertension Brother   . Esophageal cancer Brother   . Heart attack Father   . Heart block Mother   . Stroke Brother   . Stroke Mother     Social History Social History  Substance Use Topics  . Smoking status: Never Smoker   . Smokeless tobacco: Never Used  . Alcohol Use: No    Review of Systems Constitutional: No fever/chills Eyes: No visual changes. ENT: No sore throat. Cardiovascular: Denies chest pain. Respiratory: Denies shortness of breath. Gastrointestinal: No abdominal pain.  No nausea, no vomiting.  No diarrhea.  No constipation. Genitourinary: Negative for dysuria. Musculoskeletal: Negative for back pain. Endorses bilateral neck and bilateral shoulder pain. Skin: Negative for rash. Neurological: Negative for headaches or focal weakness. Endorses chronic bilateral upper extremity numbness.  10-point ROS otherwise negative.  ____________________________________________   PHYSICAL EXAM:  VITAL SIGNS: ED Triage Vitals  Enc Vitals Group     BP 10/13/15 1409 173/88 mmHg     Pulse Rate 10/13/15 1409 84     Resp 10/13/15 1409 20     Temp 10/13/15 1409 97.6 F (36.4 C)     Temp Source 10/13/15 1409 Oral     SpO2 10/13/15 1409 96 %     Weight 10/13/15 1409 158 lb (71.668 kg)     Height 10/13/15 1409 5\' 7"  (1.702 m)     Head Cir --      Peak Flow --      Pain Score 10/13/15 1410 9     Pain Loc --  Pain Edu? --      Excl. in [place]? --     Constitutional: Alert and oriented. Well appearing and in no acute distress. Eyes: Conjunctivae are normal. PERRL. EOMI. Head: Atraumatic. Nose: No congestion/rhinnorhea. Mouth/Throat: Mucous membranes are moist.  Oropharynx non-erythematous. Neck: No stridor.  No cervical spine tenderness to palpation. Cardiovascular: Normal rate, regular rhythm. Grossly normal heart sounds.  Good peripheral circulation. Respiratory: Normal respiratory effort.  No retractions. Lungs  CTAB. Gastrointestinal: Soft and nontender. No distention. No abdominal bruits. No CVA tenderness. Musculoskeletal: No lower extremity tenderness nor edema.  No joint effusions. No visible deformity to cervical spine upon inspection. Patient is diffusely tender to palpation bilateral paraspinal muscles and bilateral shoulder muscle girdle. No point tenderness. Sensation and pulses are intact distally. Neurologic:  Normal speech and language. No gross focal neurologic deficits are appreciated. No gait instability. Skin:  Skin is warm, dry and intact. No rash noted. Psychiatric: Mood and affect are normal. Speech and behavior are normal.  ____________________________________________   LABS (all labs ordered are listed, but only abnormal results are displayed)  Labs Reviewed - No data to display ____________________________________________  EKG   ____________________________________________  RADIOLOGY   ____________________________________________   PROCEDURES  Procedure(s) performed: None  Critical Care performed: No   Medications  ketorolac (TORADOL) 30 MG/ML injection 60 mg (not administered)    ____________________________________________   INITIAL IMPRESSION / ASSESSMENT AND PLAN / ED COURSE  Pertinent labs & imaging results that were available during my care of the patient were reviewed by me and considered in my medical decision making (see chart for details).  Impression presents emergency Department with a known diagnosis of bilateral cervical radiculopathy. Patient has having an increase in pain and symptoms. She is being followed by a spine specialist and is scheduled to see him tomorrow. Patient is given Toradol injection here in the emergency department discharged home with meloxicam and limited prescription for tramadol. Patient will follow-up with spine specialist in the morning.  Patient has allergy listed for tramadol however the patient states that she  takes same with no issues. Will prescribe tramadol.   New Prescriptions   MELOXICAM (MOBIC) 15 MG TABLET    Take 1 tablet (15 mg total) by mouth daily.   TRAMADOL (ULTRAM) 50 MG TABLET    Take 1 tablet (50 mg total) by mouth every 6 (six) hours as needed.    ____________________________________________   FINAL CLINICAL IMPRESSION(S) / ED DIAGNOSES  Final diagnoses:  Cervical radiculopathy, chronic      Darletta Moll, PA-C 10/13/15 1616  Hinda Kehr, MD 10/14/15 [phone]

## 2015-10-14 ENCOUNTER — Encounter: Payer: Self-pay | Admitting: Family Medicine

## 2015-10-14 ENCOUNTER — Ambulatory Visit (INDEPENDENT_AMBULATORY_CARE_PROVIDER_SITE_OTHER): Payer: Medicare Other | Admitting: Family Medicine

## 2015-10-14 DIAGNOSIS — R300 Dysuria: Secondary | ICD-10-CM

## 2015-10-14 DIAGNOSIS — I6322 Cerebral infarction due to unspecified occlusion or stenosis of basilar arteries: Secondary | ICD-10-CM | POA: Diagnosis not present

## 2015-10-14 DIAGNOSIS — M47812 Spondylosis without myelopathy or radiculopathy, cervical region: Secondary | ICD-10-CM | POA: Diagnosis not present

## 2015-10-14 LAB — POCT URINALYSIS DIPSTICK
Glucose, UA: NEGATIVE
Ketones, UA: 15
Leukocytes, UA: NEGATIVE
Nitrite, UA: NEGATIVE
PROTEIN UA: 30
RBC UA: NEGATIVE
UROBILINOGEN UA: 0.2
pH, UA: 5.5

## 2015-10-14 NOTE — Progress Notes (Signed)
Pre visit review using our clinic review tool, if applicable. No additional management support is needed unless otherwise documented below in the visit note. 

## 2015-10-14 NOTE — Assessment & Plan Note (Signed)
Urinalysis negative for blood and leukocytes and nitrites today. Holding off on antibiotic while awaiting culture. Advised continued use of vaginal lubricant.

## 2015-10-14 NOTE — Patient Instructions (Signed)
Your urinalysis was not consistent with infection.  We will await the culture.  If you worsen please let us know  Dr. Lacinda Axon

## 2015-10-14 NOTE — Progress Notes (Signed)
Subjective:  Patient ID: Aimee Jones, female    DOB: [birth year]  Age: 69 y.o. MRN: [id number]  CC: Burning with urination, ? UTI  HPI:  69 year old female with a competent past medical history including Parkinson's, memory loss, CVA, hypertension, CKD, copious/benzo/alcohol abuse presents with complaints of burning with urination.  Dysuria  Patient and husband report that she's had burning with urination for the past 3 days.  They report associated urinary frequency.  No associated fever, chills.  Husband states that an over-the-counter vaginal lubricant was applied with improvement.  No known exacerbating factors.  No reports of flank pain.  Social Hx   Social History   Social History  . Marital Status: Married    Spouse Name: N/A  . Number of Children: N/A  . Years of Education: N/A   Occupational History  . retired     [employer]   Social History Main Topics  . Smoking status: Never Smoker   . Smokeless tobacco: Never Used  . Alcohol Use: No  . Drug Use: No  . Sexual Activity: Yes   Other Topics Concern  . None   Social History Narrative   Married. Retired.     Review of Systems  Constitutional: Negative for fever.  Genitourinary: Positive for dysuria and frequency. Negative for flank pain.   Objective:  BP 136/94 mmHg  Pulse 92  Temp(Src) 97.8 F (36.6 C) (Oral)  Ht 5\' 7"  (1.702 m)  Wt 155 lb 12 oz (70.648 kg)  BMI 24.39 kg/m2  SpO2 95%  BP/Weight 10/14/2015 10/13/2015 [id number]  Systolic BP [id number]  Diastolic BP 94 88 95  Wt. (Lbs) 155.75 158 158  BMI 24.39 24.74 24.74    Physical Exam  Constitutional:  Frail elderly female in no acute distress.  Cardiovascular: Normal rate and regular rhythm.   Pulmonary/Chest: Effort normal and breath sounds normal. No respiratory distress. She has no wheezes.  Abdominal: Soft. She exhibits no distension. There is no tenderness. There is no rebound and no guarding.  Neurological: She is  alert.  Psychiatric:  Flat affect.  Vitals reviewed.  Lab Results  Component Value Date   WBC 9.3 09/13/2015   HGB 13.8 09/13/2015   HCT 42.4 09/13/2015   PLT 572* 09/13/2015   GLUCOSE 101* 07/29/2015   CHOL 127 07/15/2015   TRIG 141.0 07/15/2015   HDL 46.20 07/15/2015   LDLCALC 53 07/15/2015   ALT 15 07/19/2015   AST 17 07/19/2015   NA 138 07/29/2015   K 3.7 07/29/2015   CL 105 07/29/2015   CREATININE 0.85 07/29/2015   BUN 10 07/29/2015   CO2 27 07/29/2015   TSH 0.345* 07/05/2015   INR 1.19 06/25/2015   HGBA1C 5.7* 10/13/2014   Urinalysis    Component Value Date/Time   COLORURINE STRAW* 07/08/2015 1323   APPEARANCEUR HAZY* 07/08/2015 1323   LABSPEC 1.021 07/08/2015 1323   PHURINE 6.0 07/08/2015 1323   GLUCOSEU NEGATIVE 07/08/2015 1323   GLUCOSEU NEGATIVE 03/22/2015 1528   HGBUR NEGATIVE 07/08/2015 1323   HGBUR negative 09/21/2007 1004   BILIRUBINUR small 10/14/2015 1309   BILIRUBINUR NEGATIVE 07/08/2015 1323   KETONESUR NEGATIVE 07/08/2015 1323   PROTEINUR 30 10/14/2015 1309   PROTEINUR NEGATIVE 07/08/2015 1323   UROBILINOGEN 0.2 10/14/2015 1309   UROBILINOGEN 0.2 07/04/2015 1955   NITRITE neg 10/14/2015 1309   NITRITE NEGATIVE 07/08/2015 1323   LEUKOCYTESUR Negative 10/14/2015 1309   Assessment & Plan:   Problem List Items Addressed This Visit  Dysuria    Urinalysis negative for blood and leukocytes and nitrites today. Holding off on antibiotic while awaiting culture. Advised continued use of vaginal lubricant.      Relevant Orders   POCT Urinalysis Dipstick (Completed)   Urine culture      No orders of the defined types were placed in this encounter.    Follow-up: No Follow-up on file.  [place]

## 2015-10-15 ENCOUNTER — Telehealth: Payer: Self-pay | Admitting: Gastroenterology

## 2015-10-15 MED ORDER — ONDANSETRON HCL 4 MG PO TABS: 4.0000 mg | ORAL_TABLET | Freq: Two times a day (BID) | ORAL | Status: DC

## 2015-10-15 NOTE — Telephone Encounter (Signed)
Pt is having black stools, she was on iron but stopped that 2 weeks ago.  She has some morning nausea, she is taking pepto bismol daily for the nausea.  Pt advised that the pepto can cause black stools.  The pt's husband states the main issue is the nausea and would like to have something called in for nausea.  Can we send in zofran?  She was on phenergan in the past but this caused drowsiness.  Please advise

## 2015-10-15 NOTE — Telephone Encounter (Signed)
Pt notified of the prescription for zofran and to stop pepto and call back if the stools do not get better after 2-3 days

## 2015-10-15 NOTE — Telephone Encounter (Signed)
Yes, zofran 4mg  pill, one pill twice daily, disp 60 with 4 refills.  Have her stop the pepto and call back if still black stools in 2-3 days.

## 2015-10-16 ENCOUNTER — Other Ambulatory Visit: Payer: Self-pay | Admitting: Family Medicine

## 2015-10-16 LAB — URINE CULTURE

## 2015-10-19 DIAGNOSIS — J3081 Allergic rhinitis due to animal (cat) (dog) hair and dander: Secondary | ICD-10-CM | POA: Diagnosis not present

## 2015-10-19 DIAGNOSIS — J3089 Other allergic rhinitis: Secondary | ICD-10-CM | POA: Diagnosis not present

## 2015-10-25 ENCOUNTER — Telehealth: Payer: Self-pay | Admitting: Gastroenterology

## 2015-10-25 MED ORDER — ONDANSETRON HCL 8 MG PO TABS: 8.0000 mg | ORAL_TABLET | Freq: Three times a day (TID) | ORAL | Status: DC | PRN

## 2015-10-25 NOTE — Telephone Encounter (Signed)
He can double the zofran (8mg  twice daily), will probably need new script for that.  Also next available rov with me.

## 2015-10-25 NOTE — Telephone Encounter (Signed)
Pts husband states pt is having problems with nausea and it is affecting her appetite and she is losing weight. States he gives her zofran 4mg  in the am and in about 32min-1hr she states she is still sick and he will give her another zofran 4mg . Reports after that she is usually better later in the day. Husband wants to know if there is something else he can do. Please advise.

## 2015-10-25 NOTE — Telephone Encounter (Signed)
Pts husband aware. Script sent to pharmacy and pt scheduled to see Dr. Ardis Hughs 12/15/15@11 :15am.

## 2015-10-26 DIAGNOSIS — J3089 Other allergic rhinitis: Secondary | ICD-10-CM | POA: Diagnosis not present

## 2015-10-26 DIAGNOSIS — J3081 Allergic rhinitis due to animal (cat) (dog) hair and dander: Secondary | ICD-10-CM | POA: Diagnosis not present

## 2015-11-01 ENCOUNTER — Telehealth: Payer: Self-pay | Admitting: Gastroenterology

## 2015-11-01 ENCOUNTER — Other Ambulatory Visit (INDEPENDENT_AMBULATORY_CARE_PROVIDER_SITE_OTHER): Payer: Medicare Other

## 2015-11-01 ENCOUNTER — Encounter: Payer: Self-pay | Admitting: Gastroenterology

## 2015-11-01 ENCOUNTER — Ambulatory Visit (INDEPENDENT_AMBULATORY_CARE_PROVIDER_SITE_OTHER): Payer: Medicare Other | Admitting: Gastroenterology

## 2015-11-01 VITALS — BP 136/88 | HR 80 | Ht 67.0 in | Wt 151.6 lb

## 2015-11-01 DIAGNOSIS — R112 Nausea with vomiting, unspecified: Secondary | ICD-10-CM | POA: Diagnosis not present

## 2015-11-01 LAB — CBC WITH DIFFERENTIAL/PLATELET
BASOS ABS: 0 10*3/uL (ref 0.0–0.1)
Basophils Relative: 0.3 % (ref 0.0–3.0)
Eosinophils Absolute: 0.4 10*3/uL (ref 0.0–0.7)
Eosinophils Relative: 3 % (ref 0.0–5.0)
HCT: 48.1 % — ABNORMAL HIGH (ref 36.0–46.0)
HEMOGLOBIN: 15.5 g/dL — AB (ref 12.0–15.0)
LYMPHS ABS: 3.1 10*3/uL (ref 0.7–4.0)
Lymphocytes Relative: 23.1 % (ref 12.0–46.0)
MCHC: 32.2 g/dL (ref 30.0–36.0)
MCV: 84.8 fl (ref 78.0–100.0)
MONOS PCT: 5.5 % (ref 3.0–12.0)
Monocytes Absolute: 0.7 10*3/uL (ref 0.1–1.0)
NEUTROS PCT: 68.1 % (ref 43.0–77.0)
Neutro Abs: 9.2 10*3/uL — ABNORMAL HIGH (ref 1.4–7.7)
Platelets: 813 10*3/uL — ABNORMAL HIGH (ref 150.0–400.0)
RBC: 5.67 Mil/uL — AB (ref 3.87–5.11)
RDW: 14.9 % (ref 11.5–15.5)
WBC: 13.5 10*3/uL — AB (ref 4.0–10.5)

## 2015-11-01 LAB — COMPREHENSIVE METABOLIC PANEL
ALK PHOS: 61 U/L (ref 39–117)
ALT: 16 U/L (ref 0–35)
AST: 20 U/L (ref 0–37)
Albumin: 4.1 g/dL (ref 3.5–5.2)
BILIRUBIN TOTAL: 0.3 mg/dL (ref 0.2–1.2)
BUN: 15 mg/dL (ref 6–23)
CO2: 23 mEq/L (ref 19–32)
Calcium: 10 mg/dL (ref 8.4–10.5)
Chloride: 100 mEq/L (ref 96–112)
Creatinine, Ser: 0.92 mg/dL (ref 0.40–1.20)
GFR: 64.05 mL/min (ref >60.00)
GLUCOSE: 90 mg/dL (ref 70–99)
Potassium: 3.8 mEq/L (ref 3.5–5.1)
SODIUM: 136 meq/L (ref 135–145)
TOTAL PROTEIN: 7.2 g/dL (ref 6.0–8.3)

## 2015-11-01 NOTE — Telephone Encounter (Signed)
Pt has been added to the schedule for today at 1:45 pm

## 2015-11-01 NOTE — Patient Instructions (Addendum)
You will have labs checked today in the basement lab.  Please head down after you check out with the front desk  (cbc, cmet).  Tramadol, Butrans, Aricept, Mobic, Iron can all cause/contribute to nausea. Stop the iron today. Try to cut back or stop the tramadol, butrans, mobic if you can. Talk with Dr. Carles Collet about cutting back to stopping the aricept. Say on zofran 2-3 times a day.

## 2015-11-01 NOTE — Progress Notes (Signed)
Review of pertinent gastrointestinal problems: 1. FOBT positive stool, 06/2015 when in hosp 2. Diarrhea: started 06/2015: labs 07/2015: C. Diff + by PCR, routine stool culture neg, ova and parasites neg; CBC with slightly elevated WBC, not anemic. Was started on flagyl 500mg  tid on 10/5.  Colonoscopy Dr. Ardis Hughs 07/2015 found several small polyps (not removed due to comorbidities), otherwise normal including normal terminal ileum..  Random biopsied were normal. 3. Nausea/vomiting 2016/2017: EGD 07/2015 Dr. Ardis Hughs was normal.  HPI: This is a   very pleasant 70 year old woman whom I last saw about 3 months ago. She is here with her husband today who answers most of the questions.  Chief complaint is nausea  Has been very nauseas.  Not eating much because is afraid she'll throw up.     She is not bothered by abdominal pains and her diarrhea has completely resolved.  Has  A lot of orthopedic pains, neck pains, etc.  She has lost 14 pounds since last visit here 3 months ago.    Past Medical History  Diagnosis Date  . Hyperlipidemia   . ETOH abuse   . Allergy   . Diverticulosis   . Hypertension   . Depression   . Asthma   . GERD (gastroesophageal reflux disease)   . Normal 24 hour ambulatory pH monitoring study     good acid suppression  . Dementia   . Shortness of breath   . Anxiety     panic attacks- on occas.   . Arthritis     osteoporosis - especially hips.   . Stroke ([place])   . Memory loss   . Confusion   . Hyponatremia 07/05/2015  . Observed seizure-like activity ([place]) 07/05/2015    observed by husband , Dr Doy Mince  . [place]     Past Surgical History  Procedure Laterality Date  . Dobutamine stress echo  07/09/2001    normal  . Esophagogastroduodenoscopy  06/11/2006    normal  . Abdominal hysterectomy    . Total hip arthroplasty      left 1991/right 2008  . Nasal sinus surgery  1990  . Eye surgery      cataracts removed, IOL- both eyes   . Breast surgery      3x-  biposies   . Shoulder arthroscopy Left   . Reverse shoulder arthroplasty Right 08/13/2014    Procedure: REVERSE SHOULDER ARTHROPLASTY;  Surgeon: Marin Shutter, MD;  Location: [facility];  Service: Orthopedics;  Laterality: Right;  interscalene block  . Joint replacement      bilateral hips  . Loop recorder implant N/A 10/14/2014    Procedure: LOOP RECORDER IMPLANT;  Surgeon: Evans Lance, MD;  Location: [facility] CATH LAB;  Service: Cardiovascular;  Laterality: N/A;  . Tee without cardioversion N/A 10/14/2014    Procedure: TRANSESOPHAGEAL ECHOCARDIOGRAM (TEE);  Surgeon: Sueanne Margarita, MD;  Location[facility] ENDOSCOPY;  Service: Cardiovascular;  Laterality: N/A;  . Esophagogastroduodenoscopy (egd) with propofol N/A 08/05/2015    Procedure: ESOPHAGOGASTRODUODENOSCOPY (EGD) WITH PROPOFOL;  Surgeon: [name], MD;  Location: [facility] ENDOSCOPY;  Service: Endoscopy;  Laterality: N/A;  . Colonoscopy with propofol N/A 08/05/2015    Procedure: COLONOSCOPY WITH PROPOFOL;  Surgeon: [name], MD;  Location: [facility] ENDOSCOPY;  Service: Endoscopy;  Laterality: N/A;    Current Outpatient Prescriptions  Medication Sig Dispense Refill  . acetaminophen (TYLENOL) 500 MG tablet Take 1-2 tablets (500-1,000 mg total) by mouth every 6 (six) hours as needed for mild pain. (Patient  taking differently: Take 650 mg by mouth every 6 (six) hours as needed for mild pain. ) 30 tablet   . amLODipine (NORVASC) 2.5 MG tablet TAKE 1 TABLET BY MOUTH DAILY 30 tablet 5  . atorvastatin (LIPITOR) 10 MG tablet Take 10 mg by mouth daily at 6 PM.     . Buprenorphine (BUTRANS) 7.5 MCG/HR PTWK Place 1 patch onto the skin every 7 (seven) days.    . clopidogrel (PLAVIX) 75 MG tablet Take 1 tablet (75 mg total) by mouth daily. 90 tablet 3  . donepezil (ARICEPT) 10 MG tablet TAKE 1 TABLET BY MOUTH DAILY AT BEDTIME 90 tablet 1  . ferrous sulfate 325 (65 FE) MG tablet TAKE 1 TABLET BY MOUTH 2 TIMES DAILY WITH A MEAL (Patient taking differently: TAKE  1/2  TABLET BY MOUTH 1 TIMES DAILY WITH A MEAL) 90 tablet 0  . fluticasone (FLONASE) 50 MCG/ACT nasal spray Place 1-2 sprays into both nostrils daily. (Patient taking differently: Place 1-2 sprays into both nostrils daily as needed for allergies. ) 16 g 6  . hydrocortisone (ANUSOL-HC) 2.5 % rectal cream Apply rectally 2 times daily (Patient taking differently: Place 1 application rectally 2 (two) times daily as needed for hemorrhoids. ) 30 g 0  . meloxicam (MOBIC) 15 MG tablet Take 1 tablet (15 mg total) by mouth daily. 30 tablet 0  . montelukast (SINGULAIR) 10 MG tablet Take 10 mg by mouth at bedtime.     . Multiple Vitamins-Minerals (EYE VITAMINS & MINERALS PO) Take 1 capsule by mouth daily.    . Omega-3 Fatty Acids (FISH OIL) 1200 MG CAPS Take 1 capsule by mouth daily.    Marland Kitchen omeprazole (PRILOSEC) 20 MG capsule TAKE 1 CAPSULE TWICE DAILY FOR 10 DAYS, THEN DECREASE TO NORMAL ONCE DAILY DOSING (Patient taking differently: Take one tablet by mouth nightly.) 30 capsule 5  . ondansetron (ZOFRAN) 8 MG tablet Take 1 tablet (8 mg total) by mouth every 8 (eight) hours as needed for nausea or vomiting. 60 tablet 0  . polyvinyl alcohol (LIQUIFILM TEARS) 1.4 % ophthalmic solution Place 1 drop into both eyes daily as needed for dry eyes (allergies).    Marland Kitchen PRESCRIPTION MEDICATION Inject 1 each into the skin. Supportive therapy [place] CC-infusion    . SYMBICORT 160-4.5 MCG/ACT inhaler INHALE 2 PUFFS INTO THE LUNGS 2 (TWO) TIMES DAILY AS NEEDED (SHORTNESS OF BREATH). 10.2 Inhaler 7  . traMADol (ULTRAM) 50 MG tablet Take 1 tablet (50 mg total) by mouth every 6 (six) hours as needed. 20 tablet 0  . vitamin E 400 UNIT capsule Take 400 Units by mouth daily.     No current facility-administered medications for this visit.    Allergies as of 11/01/2015 - Review Complete 11/01/2015  Allergen Reaction Noted  . Latex Rash 07/25/2007  . Penicillins Rash 07/25/2007    Family History  Problem Relation Age of Onset  .  Sudden death Brother   . Heart attack Brother   . Hypertension Brother   . Esophageal cancer Brother   . Heart attack Father   . Heart block Mother   . Stroke Brother   . Stroke Mother     Social History   Social History  . Marital Status: Married    Spouse Name: N/A  . Number of Children: N/A  . Years of Education: N/A   Occupational History  . retired     [employer]   Social History Main Topics  . Smoking status: Never Smoker   .  Smokeless tobacco: Never Used  . Alcohol Use: No  . Drug Use: No  . Sexual Activity: Yes   Other Topics Concern  . Not on file   Social History Narrative   Married. Retired.       Physical Exam: BP 136/88 mmHg  Pulse 80  Ht 5\' 7"  (1.702 m)  Wt 151 lb 9.6 oz (68.765 kg)  BMI 23.74 kg/m2 Constitutional: generally well-appearing Psychiatric: alert and oriented x3 Abdomen: soft, nontender, nondistended, no obvious ascites, no peritoneal signs, normal bowel sounds   Assessment and plan: 70 y.o. female with chronic nausea, weight loss  The past 3-4 months she has lost about 15 pounds. She has had upper endoscopy colonoscopy, CAT scan of abdomen and pelvis without any significant findings on these. She is most bothered by nausea and now seems to be afraid to eat because she might throw up. Looking at her medication list I see 5 medicines that list nausea as a #1 or 2 common side effect. I reviewed these with her today. See the list in her instructions below. I recommended she stop iron today. I recommended she try to cut back on her 3 different pain medicines as best that she can. I recommended she discuss with Dr. Carles Collet, her neurologist, about stopping or cutting back Aricept as it does not seem to be helping her memory difficulties and it may be contributing to her nausea.  She will also get a basic set of labs today including CBC, complete metabolic profile.   Owens Loffler, MD [facility] 11/01/2015, 1:42 PM

## 2015-11-02 ENCOUNTER — Telehealth: Payer: Self-pay | Admitting: Neurology

## 2015-11-02 ENCOUNTER — Other Ambulatory Visit: Payer: Self-pay

## 2015-11-02 DIAGNOSIS — D72829 Elevated white blood cell count, unspecified: Secondary | ICD-10-CM

## 2015-11-02 DIAGNOSIS — J301 Allergic rhinitis due to pollen: Secondary | ICD-10-CM | POA: Diagnosis not present

## 2015-11-02 DIAGNOSIS — J3089 Other allergic rhinitis: Secondary | ICD-10-CM | POA: Diagnosis not present

## 2015-11-02 NOTE — Telephone Encounter (Signed)
PT's spouse Gwyndolyn Saxon called and said she was taken off Aricept and would like a call back/Dawn CB# [phone]

## 2015-11-02 NOTE — Telephone Encounter (Signed)
It would make no sense that this would be causing her nausea since she has been on it for so long without a problem but I also have no issue if she wants to d/c it (should relay message to patients husband)

## 2015-11-02 NOTE — Telephone Encounter (Signed)
See Dr Eugenia Pancoast last office note about stopping Aricept. Please advise.

## 2015-11-02 NOTE — Telephone Encounter (Signed)
Patient's husband made aware of message from Dr Tat.

## 2015-11-03 ENCOUNTER — Encounter: Payer: Self-pay | Admitting: Gastroenterology

## 2015-11-03 ENCOUNTER — Ambulatory Visit (INDEPENDENT_AMBULATORY_CARE_PROVIDER_SITE_OTHER)
Admission: RE | Admit: 2015-11-03 | Discharge: 2015-11-03 | Disposition: A | Discharge status: Home / Self Care | Payer: Medicare Other | Source: Ambulatory Visit | Attending: Gastroenterology | Admitting: Gastroenterology

## 2015-11-03 ENCOUNTER — Other Ambulatory Visit: Payer: Medicare Other

## 2015-11-03 DIAGNOSIS — D72829 Elevated white blood cell count, unspecified: Secondary | ICD-10-CM | POA: Diagnosis not present

## 2015-11-04 ENCOUNTER — Other Ambulatory Visit: Payer: Medicare Other

## 2015-11-04 DIAGNOSIS — D72829 Elevated white blood cell count, unspecified: Secondary | ICD-10-CM

## 2015-11-05 ENCOUNTER — Telehealth: Payer: Self-pay

## 2015-11-05 LAB — CLOSTRIDIUM DIFFICILE BY PCR: Toxigenic C. Difficile by PCR: DETECTED — CR

## 2015-11-05 MED ORDER — SACCHAROMYCES BOULARDII 250 MG PO CAPS: 250.0000 mg | ORAL_CAPSULE | Freq: Two times a day (BID) | ORAL | Status: DC

## 2015-11-05 MED ORDER — VANCOMYCIN HCL 125 MG PO CAPS: ORAL_CAPSULE | ORAL | Status: DC

## 2015-11-05 NOTE — Telephone Encounter (Signed)
Vancomycin 125 mg po qid for 2 weeks, then 125 mg po bid for 1 week, then 125 mg qd for 1 week, then 125 mg po qod for 1 week Florastor 1 po bid for 8 weeks

## 2015-11-05 NOTE — Telephone Encounter (Signed)
Please advise pt has a positive C Diff.

## 2015-11-05 NOTE — Telephone Encounter (Signed)
Pt aware that the vancomycin and florastor has been sent to the pharmacy

## 2015-11-06 LAB — URINE CULTURE

## 2015-11-09 ENCOUNTER — Ambulatory Visit (INDEPENDENT_AMBULATORY_CARE_PROVIDER_SITE_OTHER): Payer: Medicare Other | Admitting: *Deleted

## 2015-11-09 DIAGNOSIS — I639 Cerebral infarction, unspecified: Secondary | ICD-10-CM

## 2015-11-09 DIAGNOSIS — J3081 Allergic rhinitis due to animal (cat) (dog) hair and dander: Secondary | ICD-10-CM | POA: Diagnosis not present

## 2015-11-09 DIAGNOSIS — J3089 Other allergic rhinitis: Secondary | ICD-10-CM | POA: Diagnosis not present

## 2015-11-10 LAB — CUP PACEART REMOTE DEVICE CHECK: MDC IDC SESS DTM: 20170125000648

## 2015-11-10 NOTE — Progress Notes (Signed)
[facility] Summary Report / Loop Recorder 

## 2015-11-11 DIAGNOSIS — M47812 Spondylosis without myelopathy or radiculopathy, cervical region: Secondary | ICD-10-CM | POA: Diagnosis not present

## 2015-11-11 DIAGNOSIS — Z79891 Long term (current) use of opiate analgesic: Secondary | ICD-10-CM | POA: Diagnosis not present

## 2015-11-11 DIAGNOSIS — Z79899 Other long term (current) drug therapy: Secondary | ICD-10-CM | POA: Diagnosis not present

## 2015-11-11 DIAGNOSIS — M542 Cervicalgia: Secondary | ICD-10-CM | POA: Diagnosis not present

## 2015-11-11 DIAGNOSIS — G894 Chronic pain syndrome: Secondary | ICD-10-CM | POA: Diagnosis not present

## 2015-11-11 DIAGNOSIS — M25519 Pain in unspecified shoulder: Secondary | ICD-10-CM | POA: Diagnosis not present

## 2015-11-16 DIAGNOSIS — J3089 Other allergic rhinitis: Secondary | ICD-10-CM | POA: Diagnosis not present

## 2015-11-16 DIAGNOSIS — J3081 Allergic rhinitis due to animal (cat) (dog) hair and dander: Secondary | ICD-10-CM | POA: Diagnosis not present

## 2015-11-16 LAB — CUP PACEART REMOTE DEVICE CHECK: Date Time Interrogation Session: 20161126000726

## 2015-11-17 ENCOUNTER — Encounter: Payer: Self-pay | Admitting: *Deleted

## 2015-11-20 LAB — CUP PACEART REMOTE DEVICE CHECK: Date Time Interrogation Session: 20161226000640

## 2015-11-22 ENCOUNTER — Telehealth: Payer: Self-pay | Admitting: Gastroenterology

## 2015-11-23 DIAGNOSIS — J3081 Allergic rhinitis due to animal (cat) (dog) hair and dander: Secondary | ICD-10-CM | POA: Diagnosis not present

## 2015-11-23 DIAGNOSIS — J3089 Other allergic rhinitis: Secondary | ICD-10-CM | POA: Diagnosis not present

## 2015-11-23 NOTE — Telephone Encounter (Signed)
Pt's husband states he is giving the pt 8 mg of zofran 4 times daily with no relief for the pt, he asked if he could double the dose.  He was advised to NOT double the dose and I will forward to Dr Ardis Hughs for further recommendations.

## 2015-11-26 MED ORDER — PROMETHAZINE HCL 25 MG PO TABS: 25.0000 mg | ORAL_TABLET | Freq: Two times a day (BID) | ORAL | Status: DC

## 2015-11-26 MED ORDER — ONDANSETRON HCL 8 MG PO TABS: 8.0000 mg | ORAL_TABLET | Freq: Three times a day (TID) | ORAL | Status: DC | PRN

## 2015-11-26 NOTE — Telephone Encounter (Signed)
He should not increase the zofran any higher.  Please let him know that #1 side effect of aricept is nausea, #2 side effect of tramadol is nausea.  Probably cannot decrease the aricept but advise her to stop the tramadol pain medicine if at all possible.  She used to take narcotic pain meds, ask if she still is and if so would be best to cut back because those can really add to nausea as well.    Please prescribe phenergan 25mg  PO, disp 60 pills, she can take one pill bid in addition to the previously prescribed zofran.  11 refills

## 2015-11-26 NOTE — Telephone Encounter (Signed)
Pt's husband is aware of the prescription.  He will also try to cut back on the pain meds as well.  He will call if she is no better after the addition of the phenergan

## 2015-11-30 DIAGNOSIS — J3081 Allergic rhinitis due to animal (cat) (dog) hair and dander: Secondary | ICD-10-CM | POA: Diagnosis not present

## 2015-11-30 DIAGNOSIS — J3089 Other allergic rhinitis: Secondary | ICD-10-CM | POA: Diagnosis not present

## 2015-12-01 ENCOUNTER — Encounter: Payer: Self-pay | Admitting: Family Medicine

## 2015-12-02 ENCOUNTER — Other Ambulatory Visit: Payer: Self-pay | Admitting: Family Medicine

## 2015-12-02 DIAGNOSIS — M542 Cervicalgia: Secondary | ICD-10-CM

## 2015-12-03 ENCOUNTER — Telehealth: Payer: Self-pay | Admitting: Family Medicine

## 2015-12-03 ENCOUNTER — Ambulatory Visit: Payer: Medicare Other | Admitting: Family Medicine

## 2015-12-03 NOTE — Telephone Encounter (Signed)
I would rec OV before the labs, to see what all needs to be drawn.   If urgent, then rec UC today.   If emergent, then to ER.  O/w okay to add on Monday, 107min OV.

## 2015-12-03 NOTE — Telephone Encounter (Signed)
Dr. [name] We scheduled Aimee Jones with Dr. Caryl Bis this afternoon, and just received a call stating that Dr. Caryl Bis feels more Comfortable with you handling this situation. Would you like to see her today? I know husband was concerned about her blood levels and maybe she can just come in for stat labs? Please advise asap  Called and informed pt spouse appt was being cancelled was advised by Vaughan Basta T to send call to [facility] to triage call

## 2015-12-03 NOTE — Telephone Encounter (Signed)
Husband advised.  Appt scheduled.

## 2015-12-03 NOTE — Telephone Encounter (Signed)
[facility]  Patient Name: Aimee Jones  DOB: August 31, 1946    Initial Comment Caller states, wife has chronic shoulder neck, and head pains. Today she has nausea issues, and losing weight very fast. 35 lb in year.    Nurse Assessment  Nurse: Arnetha Courser, RN, Susie Date/Time (Eastern Time): 12/03/2015 11:47:53 AM  Confirm and document reason for call. If symptomatic, describe symptoms. You must click the next button to save text entered. ---wife has chronic shoulder, neck and occasional sharp pain in posterior head; has cervical issues and has been treated with steroid injections with other procedures; This is ongoing and has been referred to orthopedic surgeon for correction; Has general fatigue that is increasing and is concerned about medications causing the issue; weight loss is 35 pounds this past year; appetite is decreasing; Today she has nausea issues, and losing weight very fast. 35 lb in year. He is requesting blood work and an appointment today;  Has the patient traveled out of the country within the last 30 days? ---Not Applicable  Does the patient have any new or worsening symptoms? ---Yes  Will a triage be completed? ---No  Select reason for no triage. ---Other     Guidelines    Guideline Title Affirmed Question Affirmed Notes       Final Disposition User        Comments  He is concerned that her condition is changing and he is requesting that maybe lab work could be completed today - please return call to him;

## 2015-12-06 ENCOUNTER — Encounter: Payer: Self-pay | Admitting: Family Medicine

## 2015-12-06 ENCOUNTER — Ambulatory Visit: Payer: Medicare Other | Admitting: Family Medicine

## 2015-12-06 ENCOUNTER — Ambulatory Visit (INDEPENDENT_AMBULATORY_CARE_PROVIDER_SITE_OTHER): Payer: Medicare Other | Admitting: Family Medicine

## 2015-12-06 VITALS — BP 106/60 | HR 101 | Temp 97.7°F | Wt 151.0 lb

## 2015-12-06 DIAGNOSIS — R634 Abnormal weight loss: Secondary | ICD-10-CM | POA: Insufficient documentation

## 2015-12-06 DIAGNOSIS — I639 Cerebral infarction, unspecified: Secondary | ICD-10-CM | POA: Diagnosis not present

## 2015-12-06 MED ORDER — PROMETHAZINE HCL 25 MG PO TABS: 25.0000 mg | ORAL_TABLET | Freq: Two times a day (BID) | ORAL | Status: DC

## 2015-12-06 MED ORDER — TRAMADOL HCL 50 MG PO TABS: 50.0000 mg | ORAL_TABLET | Freq: Four times a day (QID) | ORAL | Status: DC | PRN

## 2015-12-06 MED ORDER — VANCOMYCIN HCL 125 MG PO CAPS: ORAL_CAPSULE | ORAL | Status: DC

## 2015-12-06 NOTE — Progress Notes (Signed)
Pre visit review using our clinic review tool, if applicable. No additional management support is needed unless otherwise documented below in the visit note.  Down 35lbs in the last 6 months, but stable in the last month, at ~151 lbs.   Still on tramadol per outside clinic.  Per husband and patient, shoulder pain is much better with warmer weather.   Still nauseated. Off and on sx.  No vomiting. No fevers.   Diarrhea resolved.  Back to baseline with normal stools.    Pt/spouse thinks the weight loss is from nausea.   Husband arranged for a week off meds that could be offending but that didn't make a difference.  Has still on tylenol and her buprenorphine patch during that week.   No burning with urination, no blood in stool.  No fevers now.    PMH and SH reviewed  ROS: See HPI, otherwise noncontributory.  Meds, vitals, and allergies reviewed.   GEN: nad, alert and pleasant in conversation HEENT: mucous membranes moist, OP wnl NECK: supple w/o LA CV: rrr. PULM: ctab, no inc wob ABD: soft, +bs, not ttp EXT: no edema SKIN: no acute rash

## 2015-12-06 NOTE — Patient Instructions (Signed)
Go to the lab on the way out.  We'll contact you with your lab report. Stay off the tylenol for a few days.  See if that helps.  If not better, then we'll have to consider if the patch is contributing to the nausea.  I'll await input from ortho.  Take care.  Glad to see you.

## 2015-12-06 NOTE — Assessment & Plan Note (Signed)
Likely from insufficient intake, likely related to nausea.  Possible that nausea is med related, okay to stop tylenol to see if that helps.  She may need time off buprenorphine patch to see if that is the offending agent.  Recheck labs today.  See notes on labs.  >25 minutes spent in face to face time with patient, >50% spent in counselling or coordination of care.

## 2015-12-07 DIAGNOSIS — J3089 Other allergic rhinitis: Secondary | ICD-10-CM | POA: Diagnosis not present

## 2015-12-07 DIAGNOSIS — J3081 Allergic rhinitis due to animal (cat) (dog) hair and dander: Secondary | ICD-10-CM | POA: Diagnosis not present

## 2015-12-07 LAB — TSH: TSH: 1.52 u[IU]/mL (ref 0.35–4.50)

## 2015-12-07 LAB — COMPREHENSIVE METABOLIC PANEL
ALBUMIN: 4.1 g/dL (ref 3.5–5.2)
ALK PHOS: 64 U/L (ref 39–117)
ALT: 18 U/L (ref 0–35)
AST: 26 U/L (ref 0–37)
BUN: 18 mg/dL (ref 6–23)
CHLORIDE: 97 meq/L (ref 96–112)
CO2: 30 mEq/L (ref 19–32)
Calcium: 10.2 mg/dL (ref 8.4–10.5)
Creatinine, Ser: 1.04 mg/dL (ref 0.40–1.20)
GFR: 55.58 mL/min — AB (ref >60.00)
Glucose, Bld: 95 mg/dL (ref 70–99)
POTASSIUM: 4.2 meq/L (ref 3.5–5.1)
Sodium: 134 mEq/L — ABNORMAL LOW (ref 135–145)
TOTAL PROTEIN: 6.9 g/dL (ref 6.0–8.3)
Total Bilirubin: 0.2 mg/dL (ref 0.2–1.2)

## 2015-12-07 LAB — CBC WITH DIFFERENTIAL/PLATELET
Basophils Absolute: 0.1 10*3/uL (ref 0.0–0.1)
Basophils Relative: 0.7 % (ref 0.0–3.0)
EOS PCT: 5.2 % — AB (ref 0.0–5.0)
Eosinophils Absolute: 0.7 10*3/uL (ref 0.0–0.7)
HEMATOCRIT: 43 % (ref 36.0–46.0)
HEMOGLOBIN: 13.8 g/dL (ref 12.0–15.0)
Lymphocytes Relative: 18.3 % (ref 12.0–46.0)
Lymphs Abs: 2.5 10*3/uL (ref 0.7–4.0)
MCHC: 32.1 g/dL (ref 30.0–36.0)
MCV: 84.1 fl (ref 78.0–100.0)
MONOS PCT: 4 % (ref 3.0–12.0)
Monocytes Absolute: 0.6 10*3/uL (ref 0.1–1.0)
Neutro Abs: 9.9 10*3/uL — ABNORMAL HIGH (ref 1.4–7.7)
Neutrophils Relative %: 71.8 % (ref 43.0–77.0)
Platelets: 805 10*3/uL — ABNORMAL HIGH (ref 150.0–400.0)
RBC: 5.11 Mil/uL (ref 3.87–5.11)
RDW: 15.4 % (ref 11.5–15.5)
WBC: 13.8 10*3/uL — AB (ref 4.0–10.5)

## 2015-12-09 ENCOUNTER — Ambulatory Visit (INDEPENDENT_AMBULATORY_CARE_PROVIDER_SITE_OTHER): Payer: Medicare Other | Admitting: *Deleted

## 2015-12-09 DIAGNOSIS — I639 Cerebral infarction, unspecified: Secondary | ICD-10-CM

## 2015-12-09 LAB — PREALBUMIN: Prealbumin: 33 mg/dL (ref 17–34)

## 2015-12-10 NOTE — Progress Notes (Signed)
[facility] Summary Report / Loop Recorder 

## 2015-12-15 ENCOUNTER — Ambulatory Visit: Payer: Medicare Other | Admitting: Gastroenterology

## 2015-12-15 ENCOUNTER — Encounter: Payer: Self-pay | Admitting: *Deleted

## 2015-12-16 ENCOUNTER — Other Ambulatory Visit: Payer: Self-pay | Admitting: Neurology

## 2015-12-16 ENCOUNTER — Other Ambulatory Visit: Payer: Self-pay

## 2015-12-16 MED ORDER — CLOPIDOGREL BISULFATE 75 MG PO TABS: ORAL_TABLET | ORAL | Status: DC

## 2015-12-18 ENCOUNTER — Encounter: Payer: Self-pay | Admitting: Family Medicine

## 2015-12-20 NOTE — Progress Notes (Signed)
Carelink summary report received. Battery status OK. Normal device function. No new symptom episodes, tachy episodes, brady, or pause episodes. No new AF episodes. Monthly summary reports and ROV/PRN 

## 2015-12-21 ENCOUNTER — Telehealth: Payer: Self-pay

## 2015-12-21 NOTE — Telephone Encounter (Signed)
Rn call patients husband about her Plavix refill. Pt was last seen by Dr. Erlinda Hong in December 2016 for a stroke referral from [facility]. Rn explain patient is to only follow up as needed. Pt does not have follow up appointment with Dr. Erlinda Hong. PT has plavix prescribed by Dr. Erlinda Hong. Pt was just given a refill on 12/16/2015 from the pharmacy. Rn explain to husband if the PCP can take over doing future reflls for the plavix. Pts husband stated his wife is a lot of pain, and that's her main issue now. PT has seen a spine doctor for her back and pain. Pts husband will send a mychart to Dr. Phillip Heal to ask if he will take over refills for plavix. Pt does see her PCP like every 8 to 12 weeks. Rn explain refills can be given after June 2017 if they have not seen the PCP in time to discuss the medication. Pts husband verbalized understanding.

## 2015-12-22 ENCOUNTER — Encounter: Payer: Self-pay | Admitting: Family Medicine

## 2015-12-27 NOTE — Progress Notes (Signed)
Carelink summary report received. Battery status OK. Normal device function. No new symptom episodes, tachy episodes, brady, or pause episodes. No new AF episodes. Monthly summary reports and ROV/PRN 

## 2015-12-30 DIAGNOSIS — J3081 Allergic rhinitis due to animal (cat) (dog) hair and dander: Secondary | ICD-10-CM | POA: Diagnosis not present

## 2015-12-30 DIAGNOSIS — J3089 Other allergic rhinitis: Secondary | ICD-10-CM | POA: Diagnosis not present

## 2016-01-04 DIAGNOSIS — J3089 Other allergic rhinitis: Secondary | ICD-10-CM | POA: Diagnosis not present

## 2016-01-04 DIAGNOSIS — J3081 Allergic rhinitis due to animal (cat) (dog) hair and dander: Secondary | ICD-10-CM | POA: Diagnosis not present

## 2016-01-05 ENCOUNTER — Inpatient Hospital Stay: Payer: Medicare Other

## 2016-01-05 ENCOUNTER — Encounter: Payer: Self-pay | Admitting: Family Medicine

## 2016-01-05 ENCOUNTER — Inpatient Hospital Stay: Payer: Medicare Other | Attending: Oncology | Admitting: Oncology

## 2016-01-05 VITALS — BP 123/87 | HR 89 | Temp 96.8°F | Resp 18 | Ht 67.0 in | Wt 150.2 lb

## 2016-01-05 DIAGNOSIS — F0390 Unspecified dementia without behavioral disturbance: Secondary | ICD-10-CM | POA: Diagnosis not present

## 2016-01-05 DIAGNOSIS — Z79899 Other long term (current) drug therapy: Secondary | ICD-10-CM

## 2016-01-05 DIAGNOSIS — D509 Iron deficiency anemia, unspecified: Secondary | ICD-10-CM

## 2016-01-05 DIAGNOSIS — D72829 Elevated white blood cell count, unspecified: Secondary | ICD-10-CM | POA: Diagnosis not present

## 2016-01-05 DIAGNOSIS — R7989 Other specified abnormal findings of blood chemistry: Secondary | ICD-10-CM

## 2016-01-05 DIAGNOSIS — D7589 Other specified diseases of blood and blood-forming organs: Secondary | ICD-10-CM

## 2016-01-05 DIAGNOSIS — R634 Abnormal weight loss: Secondary | ICD-10-CM | POA: Diagnosis not present

## 2016-01-05 DIAGNOSIS — E785 Hyperlipidemia, unspecified: Secondary | ICD-10-CM

## 2016-01-05 DIAGNOSIS — Z8673 Personal history of transient ischemic attack (TIA), and cerebral infarction without residual deficits: Secondary | ICD-10-CM

## 2016-01-05 DIAGNOSIS — M549 Dorsalgia, unspecified: Secondary | ICD-10-CM | POA: Diagnosis not present

## 2016-01-05 DIAGNOSIS — D473 Essential (hemorrhagic) thrombocythemia: Secondary | ICD-10-CM | POA: Diagnosis not present

## 2016-01-05 DIAGNOSIS — M2550 Pain in unspecified joint: Secondary | ICD-10-CM | POA: Diagnosis not present

## 2016-01-05 DIAGNOSIS — G8929 Other chronic pain: Secondary | ICD-10-CM | POA: Diagnosis not present

## 2016-01-05 DIAGNOSIS — I10 Essential (primary) hypertension: Secondary | ICD-10-CM | POA: Diagnosis not present

## 2016-01-05 DIAGNOSIS — M810 Age-related osteoporosis without current pathological fracture: Secondary | ICD-10-CM | POA: Diagnosis not present

## 2016-01-05 LAB — IRON AND TIBC
Iron: 30 ug/dL (ref 28–170)
Saturation Ratios: 8 % — ABNORMAL LOW (ref 10.4–31.8)
TIBC: 367 ug/dL (ref 250–450)
UIBC: 337 ug/dL

## 2016-01-05 LAB — CBC WITH DIFFERENTIAL/PLATELET
Basophils Absolute: 0.1 10*3/uL (ref 0–0.1)
Basophils Relative: 1 %
EOS PCT: 6 %
Eosinophils Absolute: 0.6 10*3/uL (ref 0–0.7)
HCT: 37.5 % (ref 35.0–47.0)
Hemoglobin: 12.7 g/dL (ref 12.0–16.0)
LYMPHS ABS: 2.3 10*3/uL (ref 1.0–3.6)
LYMPHS PCT: 23 %
MCH: 27.2 pg (ref 26.0–34.0)
MCHC: 33.8 g/dL (ref 32.0–36.0)
MCV: 80.4 fL (ref 80.0–100.0)
MONO ABS: 0.6 10*3/uL (ref 0.2–0.9)
Monocytes Relative: 6 %
Neutro Abs: 6.5 10*3/uL (ref 1.4–6.5)
Neutrophils Relative %: 64 %
PLATELETS: 702 10*3/uL — AB (ref 150–440)
RBC: 4.66 MIL/uL (ref 3.80–5.20)
RDW: 15.2 % — AB (ref 11.5–14.5)
WBC: 10.2 10*3/uL (ref 3.6–11.0)

## 2016-01-05 LAB — FERRITIN: FERRITIN: 30 ng/mL (ref 11–307)

## 2016-01-05 NOTE — Progress Notes (Signed)
[place]  [phone]) [phone] [phone]) [phone]  ID: Aimee Jones OB: 1946-06-01  MR#: [id number]  [id number]  Patient Care Team: Tonia Ghent, MD as PCP - General (Family Medicine) Justice Britain, MD as Consulting Physician (Orthopedic Surgery)  CHIEF COMPLAINT:  Chief Complaint  Patient presents with  . Follow-up    IDA    INTERVAL HISTORY: Patient returns to clinic today for repeat laboratory work and further evaluation. She continues to have significant chronic pain which is being managed by her primary care physician. She has also had weight loss over the past several months because of persistent nausea caused by the pain.  Although the pain and nausea are almost constant, she actually feels well here today. She has no neurologic complaints. She denies any recent fevers. She has a good appetite and denies weight loss. She has no chest pain or shortness of breath. She denies any nausea, vomiting, constipation, or diarrhea. She has no melanotic or hematochezia. She has no urinary complaints. Patient offers no further specific complaints today.  REVIEW OF SYSTEMS:   Review of Systems  Constitutional: Positive for weight loss. Negative for fever and malaise/fatigue.  Respiratory: Negative.   Cardiovascular: Negative.   Gastrointestinal: Positive for nausea. Negative for blood in stool and melena.  Musculoskeletal: Positive for back pain, joint pain and neck pain.  Neurological: Negative for weakness.    As per HPI. Otherwise, a complete review of systems is negatve.  PAST MEDICAL HISTORY: Past Medical History  Diagnosis Date  . Hyperlipidemia   . ETOH abuse   . Allergy   . Diverticulosis   . Hypertension   . Depression   . Asthma   . GERD (gastroesophageal reflux disease)   . Normal 24 hour ambulatory pH monitoring study     good acid suppression  . Dementia   . Shortness of breath   . Anxiety     panic attacks- on occas.   .  Arthritis     osteoporosis - especially hips.   . Stroke ([place])   . Memory loss   . Confusion   . Hyponatremia 07/05/2015  . Observed seizure-like activity ([place]) 07/05/2015    observed by husband , Dr Doy Mince  . Falls     PAST SURGICAL HISTORY: Past Surgical History  Procedure Laterality Date  . Dobutamine stress echo  07/09/2001    normal  . Esophagogastroduodenoscopy  06/11/2006    normal  . Abdominal hysterectomy    . Total hip arthroplasty      left 1991/right 2008  . Nasal sinus surgery  1990  . Eye surgery      cataracts removed, IOL- both eyes   . Breast surgery      3x- biposies   . Shoulder arthroscopy Left   . Reverse shoulder arthroplasty Right 08/13/2014    Procedure: REVERSE SHOULDER ARTHROPLASTY;  Surgeon: Marin Shutter, MD;  Location: [facility];  Service: Orthopedics;  Laterality: Right;  interscalene block  . Joint replacement      bilateral hips  . Loop recorder implant N/A 10/14/2014    Procedure: LOOP RECORDER IMPLANT;  Surgeon: Evans Lance, MD;  Location: [facility] CATH LAB;  Service: Cardiovascular;  Laterality: N/A;  . Tee without cardioversion N/A 10/14/2014    Procedure: TRANSESOPHAGEAL ECHOCARDIOGRAM (TEE);  Surgeon: Sueanne Margarita, MD;  Location: [facility] ENDOSCOPY;  Service: Cardiovascular;  Laterality: N/A;  . Esophagogastroduodenoscopy (egd) with propofol N/A 08/05/2015    Procedure: ESOPHAGOGASTRODUODENOSCOPY (EGD) WITH PROPOFOL;  Surgeon: [name], MD;  Location: [facility] ENDOSCOPY;  Service: Endoscopy;  Laterality: N/A;  . Colonoscopy with propofol N/A 08/05/2015    Procedure: COLONOSCOPY WITH PROPOFOL;  Surgeon: [name], MD;  Location: [facility] ENDOSCOPY;  Service: Endoscopy;  Laterality: N/A;    FAMILY HISTORY Family History  Problem Relation Age of Onset  . Sudden death Brother   . Heart attack Brother   . Hypertension Brother   . Esophageal cancer Brother   . Heart attack Father   . Heart block Mother   . Stroke Brother   . Stroke Mother         ADVANCED DIRECTIVES:    HEALTH MAINTENANCE: Social History  Substance Use Topics  . Smoking status: Never Smoker   . Smokeless tobacco: Never Used  . Alcohol Use: No     Allergies  Allergen Reactions  . Latex Rash  . Penicillins Rash    .Marland KitchenHas patient had a PCN reaction causing immediate rash, facial/tongue/throat swelling, SOB or lightheadedness with hypotension: No Has patient had a PCN reaction causing severe rash involving mucus membranes or skin necrosis: No Has patient had a PCN reaction that required hospitalization No Has patient had a PCN reaction occurring within the last 10 years: No If all of the above answers are "NO", then may proceed with Cephalosporin use.      Current Outpatient Prescriptions  Medication Sig Dispense Refill  . acetaminophen (TYLENOL) 500 MG tablet Take 1-2 tablets (500-1,000 mg total) by mouth every 6 (six) hours as needed for mild pain. 30 tablet   . amLODipine (NORVASC) 2.5 MG tablet TAKE 1 TABLET BY MOUTH DAILY 30 tablet 5  . atorvastatin (LIPITOR) 10 MG tablet Take 10 mg by mouth daily at 6 PM.     . clopidogrel (PLAVIX) 75 MG tablet TAKE 1 TABLET (75 MG TOTAL) BY MOUTH DAILY. 90 tablet 3  . donepezil (ARICEPT) 10 MG tablet TAKE 1 TABLET BY MOUTH DAILY AT BEDTIME 90 tablet 1  . fluticasone (FLONASE) 50 MCG/ACT nasal spray Place 1-2 sprays into both nostrils daily. (Patient taking differently: Place 1-2 sprays into both nostrils daily as needed for allergies. ) 16 g 6  . hydrocortisone (ANUSOL-HC) 2.5 % rectal cream Apply rectally 2 times daily (Patient taking differently: Place 1 application rectally 2 (two) times daily as needed for hemorrhoids. ) 30 g 0  . meloxicam (MOBIC) 15 MG tablet Take 1 tablet (15 mg total) by mouth daily. 30 tablet 0  . montelukast (SINGULAIR) 10 MG tablet Take 10 mg by mouth at bedtime.     . Omega-3 Fatty Acids (FISH OIL) 1200 MG CAPS Take 1 capsule by mouth daily.    Marland Kitchen omeprazole (PRILOSEC) 20 MG capsule  TAKE 1 CAPSULE TWICE DAILY FOR 10 DAYS, THEN DECREASE TO NORMAL ONCE DAILY DOSING (Patient taking differently: Take one tablet by mouth nightly.) 30 capsule 5  . ondansetron (ZOFRAN) 8 MG tablet Take 1 tablet (8 mg total) by mouth every 8 (eight) hours as needed for nausea or vomiting. 60 tablet 3  . polyvinyl alcohol (LIQUIFILM TEARS) 1.4 % ophthalmic solution Place 1 drop into both eyes daily as needed for dry eyes (allergies).    . promethazine (PHENERGAN) 25 MG tablet Take 1 tablet (25 mg total) by mouth 2 (two) times daily.    . SYMBICORT 160-4.5 MCG/ACT inhaler INHALE 2 PUFFS INTO THE LUNGS 2 (TWO) TIMES DAILY AS NEEDED (SHORTNESS OF BREATH). 10.2 Inhaler 7  . traMADol (ULTRAM) 50 MG  tablet Take 1 tablet (50 mg total) by mouth every 6 (six) hours as needed.    . vitamin E 400 UNIT capsule Take 400 Units by mouth daily.    . Multiple Vitamins-Minerals (EYE VITAMINS & MINERALS PO) Take 1 capsule by mouth daily.     No current facility-administered medications for this visit.    OBJECTIVE: Filed Vitals:   01/05/16 1320  BP: 123/87  Pulse: 89  Temp: 96.8 F (36 C)  Resp: 18     Body mass index is 23.53 kg/(m^2).    ECOG FS:1 - Symptomatic but completely ambulatory  General: Well-developed, well-nourished, no acute distress. Eyes: Pink conjunctiva, anicteric sclera. Lungs: Clear to auscultation bilaterally. Heart: Regular rate and rhythm. No rubs, murmurs, or gallops. Abdomen: Soft, nontender, nondistended. No organomegaly noted, normoactive bowel sounds. Musculoskeletal: No edema, cyanosis, or clubbing. Neuro: Alert, answering all questions appropriately. Cranial nerves grossly intact. Skin: No rashes or petechiae noted. Psych: Normal affect.   LAB RESULTS:  Lab Results  Component Value Date   NA 134* 12/06/2015   K 4.2 12/06/2015   CL 97 12/06/2015   CO2 30 12/06/2015   GLUCOSE 95 12/06/2015   BUN 18 12/06/2015   CREATININE 1.04 12/06/2015   CALCIUM 10.2 12/06/2015    PROT 6.9 12/06/2015   ALBUMIN 4.1 12/06/2015   AST 26 12/06/2015   ALT 18 12/06/2015   ALKPHOS 64 12/06/2015   BILITOT 0.2 12/06/2015   GFRNONAA 53* 07/08/2015   GFRAA >60 07/08/2015    Lab Results  Component Value Date   WBC 10.2 01/05/2016   NEUTROABS 6.5 01/05/2016   HGB 12.7 01/05/2016   HCT 37.5 01/05/2016   MCV 80.4 01/05/2016   PLT 702* 01/05/2016   Lab Results  Component Value Date   IRON 30 01/05/2016   TIBC 367 01/05/2016   IRONPCTSAT 8* 01/05/2016    Lab Results  Component Value Date   FERRITIN 30 01/05/2016     STUDIES: No results found.  ASSESSMENT: Leukocytosis, iron deficiency anemia, thrombocytosis.  PLAN:    1. Iron deficiency anemia: Patient's hemoglobin is 12.7, ferritin 30 and iron sat is low at 8. She does not require additional IV iron today, but will likely need IV iron at next visit. Previously, the remainder of her laboratory work was also negative or within normal limits. Colonoscopy and EGD in October 2016 were unrevealing.   Patient last received 510 mg IV Feraheme in August 2016. Return to clinic in 4 months with repeat laboratory work and further evaluation.  2. Leukocytosis: Resolved.  Peripheral blood flow cytometry as well as BCR-ABL mutation are negative.  3. Thrombocytosis: Platelets 702 today. JAK-2 mutation negative. Monitor. 4. Hypertension: Resolved. Patient's blood pressure is normal today. Continue current medications. 5. Pain: Continue treatment per primary care and pain clinic. 6. Nausea/Weight loss: Secondary to pain. Treatment per primary care.  Patient expressed understanding and was in agreement with this plan. She also understands that She can call clinic at any time with any questions, concerns, or complaints.    Mayra Reel, NP   01/05/2016 1:53 PM  Patient was seen and evaluated independently and I agree with the assessment and plan as dictated above.  Lloyd Huger, MD 01/07/2016 5:54 AM

## 2016-01-05 NOTE — Progress Notes (Signed)
Pt reports 35 pound weight loss over 7 months.  Has Nausea almost always when awake.  Chronic pain all hours of being awake, medication helps

## 2016-01-06 LAB — ANA W/REFLEX: ANA: NEGATIVE

## 2016-01-10 ENCOUNTER — Ambulatory Visit (INDEPENDENT_AMBULATORY_CARE_PROVIDER_SITE_OTHER): Payer: Medicare Other | Admitting: *Deleted

## 2016-01-10 ENCOUNTER — Ambulatory Visit: Payer: Medicare Other

## 2016-01-10 DIAGNOSIS — I639 Cerebral infarction, unspecified: Secondary | ICD-10-CM | POA: Diagnosis not present

## 2016-01-10 DIAGNOSIS — Z7982 Long term (current) use of aspirin: Secondary | ICD-10-CM | POA: Diagnosis not present

## 2016-01-10 DIAGNOSIS — M4802 Spinal stenosis, cervical region: Secondary | ICD-10-CM | POA: Insufficient documentation

## 2016-01-10 NOTE — Progress Notes (Signed)
[facility] Summary Report / Loop Recorder 

## 2016-01-11 ENCOUNTER — Ambulatory Visit: Payer: Medicare Other | Admitting: Oncology

## 2016-01-11 ENCOUNTER — Ambulatory Visit: Payer: Medicare Other

## 2016-01-11 ENCOUNTER — Other Ambulatory Visit: Payer: Medicare Other

## 2016-01-11 DIAGNOSIS — J3081 Allergic rhinitis due to animal (cat) (dog) hair and dander: Secondary | ICD-10-CM | POA: Diagnosis not present

## 2016-01-11 DIAGNOSIS — J3089 Other allergic rhinitis: Secondary | ICD-10-CM | POA: Diagnosis not present

## 2016-01-14 ENCOUNTER — Encounter: Payer: Self-pay | Admitting: Gastroenterology

## 2016-01-14 ENCOUNTER — Ambulatory Visit (INDEPENDENT_AMBULATORY_CARE_PROVIDER_SITE_OTHER): Payer: Medicare Other | Admitting: Gastroenterology

## 2016-01-14 VITALS — BP 136/70 | HR 84 | Ht 67.0 in | Wt 150.0 lb

## 2016-01-14 DIAGNOSIS — R112 Nausea with vomiting, unspecified: Secondary | ICD-10-CM | POA: Diagnosis not present

## 2016-01-14 DIAGNOSIS — I639 Cerebral infarction, unspecified: Secondary | ICD-10-CM | POA: Diagnosis not present

## 2016-01-14 NOTE — Progress Notes (Signed)
Review of pertinent gastrointestinal problems: 1. FOBT positive stool, 06/2015 when in hosp 2. Diarrhea: started 06/2015: labs 07/2015: C. Diff + by PCR, routine stool culture neg, ova and parasites neg; CBC with slightly elevated WBC, not anemic. Was started on flagyl 500mg  tid on 10/5. Colonoscopy Dr. Ardis Hughs 07/2015 found several small polyps (not removed due to comorbidities), otherwise normal including normal terminal ileum.. Random biopsied were normal.  C diff again + by PCR 10/2015, treated with long tapering vanc course.  3. Nausea/vomiting 2016/2017: EGD 07/2015 Dr. Ardis Hughs was normal.   HPI: This is a  very pleasant 70 year old woman whom I last saw 2 or 3 months ago. She is here with her husband today. He does almost all of the answering of questions.  She completed a long tapering course of oral vancomycin and that completely resolved her diarrhea.  Chief complaint is nausea.   This has also been in intermittent complaint of hers but seem to be overshadowed by her diarrhea more recently.  She has gained 7 pounds since last visit 2 months ago.    Diarrhea is completley gone. She feels much better.  Eating well.  No overt bleeding.  Most bothered by nausea that started 3 years ago and has gradually worsened.  Most of the day she is nauseas.  he''s been giving her alkaseltzer "gold" that he says is to treat nausea.  She takes zofran twice daily on scheduled basis.  Several of her medicines are known to cause diarrhea including tramadol, Aricept, narcotic pain medicines. Her husband says he held all of these medicines at various times and she was still nauseous.  Past Medical History  Diagnosis Date  . Hyperlipidemia   . ETOH abuse   . Allergy   . Diverticulosis   . Hypertension   . Depression   . Asthma   . GERD (gastroesophageal reflux disease)   . Normal 24 hour ambulatory pH monitoring study     good acid suppression  . Dementia   . Shortness of breath   . Anxiety      panic attacks- on occas.   . Arthritis     osteoporosis - especially hips.   . Stroke ([place])   . Memory loss   . Confusion   . Hyponatremia 07/05/2015  . Observed seizure-like activity ([place]) 07/05/2015    observed by husband , Dr Doy Mince  . Falls     Past Surgical History  Procedure Laterality Date  . Dobutamine stress echo  07/09/2001    normal  . Esophagogastroduodenoscopy  06/11/2006    normal  . Abdominal hysterectomy    . Total hip arthroplasty      left 1991/right 2008  . Nasal sinus surgery  1990  . Eye surgery      cataracts removed, IOL- both eyes   . Breast surgery      3x- biposies   . Shoulder arthroscopy Left   . Reverse shoulder arthroplasty Right 08/13/2014    Procedure: REVERSE SHOULDER ARTHROPLASTY;  Surgeon: Marin Shutter, MD;  Location: [facility];  Service: Orthopedics;  Laterality: Right;  interscalene block  . Joint replacement      bilateral hips  . Loop recorder implant N/A 10/14/2014    Procedure: LOOP RECORDER IMPLANT;  Surgeon: Evans Lance, MD;  Location: [facility] CATH LAB;  Service: Cardiovascular;  Laterality: N/A;  . Tee without cardioversion N/A 10/14/2014    Procedure: TRANSESOPHAGEAL ECHOCARDIOGRAM (TEE);  Surgeon: Sueanne Margarita, MD;  Location: [facility];  Service: Cardiovascular;  Laterality: N/A;  . Esophagogastroduodenoscopy (egd) with propofol N/A 08/05/2015    Procedure: ESOPHAGOGASTRODUODENOSCOPY (EGD) WITH PROPOFOL;  Surgeon: [name], MD;  Location: [facility] ENDOSCOPY;  Service: Endoscopy;  Laterality: N/A;  . Colonoscopy with propofol N/A 08/05/2015    Procedure: COLONOSCOPY WITH PROPOFOL;  Surgeon: [name], MD;  Location: [facility] ENDOSCOPY;  Service: Endoscopy;  Laterality: N/A;    Current Outpatient Prescriptions  Medication Sig Dispense Refill  . acetaminophen (TYLENOL) 500 MG tablet Take 1-2 tablets (500-1,000 mg total) by mouth every 6 (six) hours as needed for mild pain. (Patient taking differently: Take 650 mg by mouth  every 4 (four) hours as needed for mild pain. ) 30 tablet   . amLODipine (NORVASC) 2.5 MG tablet TAKE 1 TABLET BY MOUTH DAILY 30 tablet 5  . atorvastatin (LIPITOR) 10 MG tablet Take 10 mg by mouth daily at 6 PM.     . clopidogrel (PLAVIX) 75 MG tablet TAKE 1 TABLET (75 MG TOTAL) BY MOUTH DAILY. 90 tablet 3  . donepezil (ARICEPT) 10 MG tablet TAKE 1 TABLET BY MOUTH DAILY AT BEDTIME 90 tablet 1  . fluticasone (FLONASE) 50 MCG/ACT nasal spray Place 1-2 sprays into both nostrils daily. (Patient taking differently: Place 1-2 sprays into both nostrils daily as needed for allergies. ) 16 g 6  . hydrocortisone (ANUSOL-HC) 2.5 % rectal cream Apply rectally 2 times daily (Patient taking differently: Place 1 application rectally 2 (two) times daily as needed for hemorrhoids. ) 30 g 0  . meloxicam (MOBIC) 15 MG tablet Take 1 tablet (15 mg total) by mouth daily. 30 tablet 0  . montelukast (SINGULAIR) 10 MG tablet Take 10 mg by mouth at bedtime.     . Multiple Vitamins-Minerals (EYE VITAMINS & MINERALS PO) Take 1 capsule by mouth daily.    . Omega-3 Fatty Acids (FISH OIL) 1200 MG CAPS Take 1 capsule by mouth daily.    Marland Kitchen omeprazole (PRILOSEC) 20 MG capsule TAKE 1 CAPSULE TWICE DAILY FOR 10 DAYS, THEN DECREASE TO NORMAL ONCE DAILY DOSING (Patient taking differently: Take one tablet by mouth nightly.) 30 capsule 5  . ondansetron (ZOFRAN) 8 MG tablet Take 1 tablet (8 mg total) by mouth every 8 (eight) hours as needed for nausea or vomiting. 60 tablet 3  . polyvinyl alcohol (LIQUIFILM TEARS) 1.4 % ophthalmic solution Place 1 drop into both eyes daily as needed for dry eyes (allergies).    . promethazine (PHENERGAN) 25 MG tablet Take 1 tablet (25 mg total) by mouth 2 (two) times daily.    . SYMBICORT 160-4.5 MCG/ACT inhaler INHALE 2 PUFFS INTO THE LUNGS 2 (TWO) TIMES DAILY AS NEEDED (SHORTNESS OF BREATH). 10.2 Inhaler 7  . traMADol (ULTRAM) 50 MG tablet Take 1 tablet (50 mg total) by mouth every 6 (six) hours as needed.  (Patient taking differently: Take 100 mg by mouth every 6 (six) hours as needed. )    . vitamin E 400 UNIT capsule Take 400 Units by mouth daily.     No current facility-administered medications for this visit.    Allergies as of 01/14/2016 - Review Complete 01/14/2016  Allergen Reaction Noted  . Latex Rash 07/25/2007  . Penicillins Rash 07/25/2007    Family History  Problem Relation Age of Onset  . Sudden death Brother   . Heart attack Brother   . Hypertension Brother   . Esophageal cancer Brother   . Heart attack Father   . Heart block Mother   .  Stroke Brother   . Stroke Mother     Social History   Social History  . Marital Status: Married    Spouse Name: N/A  . Number of Children: N/A  . Years of Education: N/A   Occupational History  . retired     [employer]   Social History Main Topics  . Smoking status: Never Smoker   . Smokeless tobacco: Never Used  . Alcohol Use: No  . Drug Use: No  . Sexual Activity: Yes   Other Topics Concern  . Not on file   Social History Narrative   Married. Retired.       Physical Exam: BP 136/70 mmHg  Pulse 84  Ht 5\' 7"  (1.702 m)  Wt 158 lb (71.668 kg)  BMI 24.74 kg/m2 Constitutional: generally well-appearing Psychiatric: alert and oriented x3 Abdomen: soft, nontender, nondistended, no obvious ascites, no peritoneal signs, normal bowel sounds   Assessment and plan: 70 y.o. female with Chronic nausea, resolved C. difficile related diarrhea  She has had upper endoscopy, colonoscopy, CT scan. I cannot explain her continued nausea that seems to now be her most significant symptom at least per her husband. Her C. difficile related diarrhea has resolved after long tapering course of vancomycin. It is not clear to me what is causing her persistent nausea but she certainly is on several medicines that are known to cause it. I don't think any further GI testing is necessarily warranted. I'm happy to refill her antinausea  medicines as she needs them.Aimee Jones return to see me in 6 months and sooner if needed   Owens Loffler, MD [name] Gastroenterology 01/14/2016, 8:53 AM

## 2016-01-14 NOTE — Patient Instructions (Signed)
Will refill antinausea medicines as needed. Can continue alka seltzer gold since it doesn't seem to hurt and may help. Please return to see Dr. Ardis Hughs in 6 months, sooner if needed.

## 2016-01-18 DIAGNOSIS — J3081 Allergic rhinitis due to animal (cat) (dog) hair and dander: Secondary | ICD-10-CM | POA: Diagnosis not present

## 2016-01-18 DIAGNOSIS — J3089 Other allergic rhinitis: Secondary | ICD-10-CM | POA: Diagnosis not present

## 2016-01-19 DIAGNOSIS — M4313 Spondylolisthesis, cervicothoracic region: Secondary | ICD-10-CM | POA: Diagnosis not present

## 2016-01-19 DIAGNOSIS — H748X9 Other specified disorders of middle ear and mastoid, unspecified ear: Secondary | ICD-10-CM | POA: Diagnosis not present

## 2016-01-19 DIAGNOSIS — M4802 Spinal stenosis, cervical region: Secondary | ICD-10-CM | POA: Diagnosis not present

## 2016-01-19 DIAGNOSIS — M9971 Connective tissue and disc stenosis of intervertebral foramina of cervical region: Secondary | ICD-10-CM | POA: Diagnosis not present

## 2016-01-21 ENCOUNTER — Telehealth: Payer: Self-pay

## 2016-01-21 NOTE — Telephone Encounter (Signed)
Cheryl with [facility] left v/m; pts husband wanted pt evaluated to see if hospice appropriate; Malachy Mood does not think at [facility] level yet but does think pt would benefit from home health services for nursing and PT. Pts dementia has increased. Dr [name] out of office today.Please advise. Cheryl request cb.

## 2016-01-21 NOTE — Telephone Encounter (Signed)
I think they can make the call directly to hospice and request patient eval.  I don't think they would need a referral.   If she is hospice eligible, and if she accepts, then we could proceed from that point.  Thanks.

## 2016-01-21 NOTE — Telephone Encounter (Signed)
I'll work on the hard copy when I get back to the office.  Thanks.

## 2016-01-21 NOTE — Telephone Encounter (Signed)
Can wait for Dr. [name] to return.

## 2016-01-21 NOTE — Telephone Encounter (Signed)
Spoke with Rosaria Ferries who called Hospice and they now need a form from the MD.  Form is in Dr. Buckner [place] In [place].

## 2016-01-25 ENCOUNTER — Encounter: Payer: Self-pay | Admitting: Family Medicine

## 2016-01-25 ENCOUNTER — Ambulatory Visit (INDEPENDENT_AMBULATORY_CARE_PROVIDER_SITE_OTHER): Payer: Medicare Other | Admitting: Neurology

## 2016-01-25 ENCOUNTER — Encounter: Payer: Self-pay | Admitting: Neurology

## 2016-01-25 VITALS — BP 140/80 | HR 82 | Ht 67.0 in | Wt 149.0 lb

## 2016-01-25 DIAGNOSIS — F1027 Alcohol dependence with alcohol-induced persisting dementia: Secondary | ICD-10-CM

## 2016-01-25 DIAGNOSIS — F1097 Alcohol use, unspecified with alcohol-induced persisting dementia: Secondary | ICD-10-CM

## 2016-01-25 DIAGNOSIS — R42 Dizziness and giddiness: Secondary | ICD-10-CM | POA: Diagnosis not present

## 2016-01-25 DIAGNOSIS — R110 Nausea: Secondary | ICD-10-CM

## 2016-01-25 DIAGNOSIS — I639 Cerebral infarction, unspecified: Secondary | ICD-10-CM | POA: Diagnosis not present

## 2016-01-25 DIAGNOSIS — G894 Chronic pain syndrome: Secondary | ICD-10-CM | POA: Diagnosis not present

## 2016-01-25 NOTE — Progress Notes (Addendum)
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  The patient is accompanied by her husband who supplements the history.  The patient reports that she first noted some tremor in October at her PE when her L arm was lifted above the head.  She denies tremor otherwise but her husband noted tremor for a year when she went to pick up coffee.  She has been on abilify for depression for a few years.  She has not been on other atypical agents.  04/15/2013 update:  I did have the opportunity to review records since she was last here.  The patient needs a shoulder surgery and has been seen by the orthopedic physician, but he did not want to do the surgery until the patient discontinued her alcohol use.  She did end up having her surgery done on June 11.  She is now in physical therapy.  The patient does have a history of alcohol abuse.  She had stopped drinking prior to her orthopedic shoulder surgery on the left, but her husband states that she has started drinking again.  He states that her balance got markedly better when she stopped drinking.  The patient states that she is only drinking one drink per day now.  The patient's Abilify was discontinued last November, and her husband noticed an improvement in tremor.  05/22/14 update: Pt has a hx of parkinsonism, that had resolved off of abilify.  She has not been seen in over a year.  At last visit, I had noted memory loss and recommended that she not drive unless she have and pass a medical driving evaluation.  Her husband states that memory has dramatically deteriorated.  She came to her husband recently with a wrapper around cheese and tried to put it around candy and was just confused.  On her way in today, she was bending over in the parking lot picking up trash below cars.  He tried to get her into a [facility] alzheimers study but she was turned down because of past EtOH abuse and  narcotic meds.  She is still drinking but her husband states that he controls it completely.  He gives her 4 oz per day of liquor.  He states that he just isn't sure that he wants to "deny her a little pleasure."  No falls since her husband took over distrubution of the EtOH.   Rare visual and auditory hallucinations.  Last driving was 3 weeks ago but her husband doesn't plan on having her drive any longer.  She wears depends for 3 years.  Her husband has a wood working shop next to the house but he is starting to think that he needs to turn off the breaker to the stove when he leaves.  She takes 2 xanax at night for sleep.  She has doxepin but doesn't take it.  She does take 4 ibuprofen in the AM and 2 tylenol at night for shoulder pain and one time per week will take 7.5 mg of hydrocodone.  No longer on thiamine.    03/22/15 update:  The patient returns today for follow-up.  I have not seen her since August of last year.  She did have neuropsych testing last September, 2015 and Dr. 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  She is still drinking 4 nights a week (husband prepares it and mixes liquor with water).  She did have right shoulder  replacement since last visit and she was doing well but now the sx's are coming back.  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  She is to have an injection next week for that pain.  She was hospitalized in December, 2015.  I reviewed his records.  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  She had an echocardiogram that did not reveal any source of emboli.  Her ejection fraction was 55-60%.  She had a normal carotid ultrasound.  Lower extremity Dopplers were negative.  She underwent a TEE on 10/14/2014 and a small PFO was identified.  She had a loop recorder implanted that same day.  She has followed up several times with  [facility] neurology.  Dr. Jannifer Franklin did an EMG on the patient.  She just saw [facility] neurology a few days ago.  Her husband, however, states that she was told to follow-up here.  She is on Aricept for her memory loss. Her husband states that he has always given it to her in the AM(been on it since at least October last year) but about 3 weeks ago he changed it to q hs as he realized that the bottle said it was to be given at night.  However, since that time she seems a bit more confused.  Her husband also states that pt c/o low lying daily headache.  He asks if it is related to the fact that she is taking oxycodone 3-4 times per day.  Headache is holocephalic.     08/24/15 update:  The patient is following up today, accompanied by her husband who supplements the history.  Much has happened since our last visit and I reviewed her extensive records and hospital stays.  She has a history of alcohol related dementia and last time I talked to them again about the importance of discontinuing all alcohol and not just distributing alcohol to her, as well as previously being done by her husband.  I also talked to them about the fact that Xanax, oxycodone, fentanyl and doxepin could all contribute to her cognitive change.  She ended up getting admitted to the hospital from September 6 to September 13 after she fell and it was determined that her sodium was only 112.  She had coffee ground emesis and it was felt that this is likely from her alcohol abuse.  She was discharged to a subacute nursing facility.  Only 5 days later she returned with a seizure.  She was admitted at [facility] for this from September 18 to 07/07/2015.  The seizure lasted between 2 and 4 minutes.  Her head turned to the left before she shook all over.  She did not bite her tongue or lose bladder control.  The seizure was felt to be multifactorial, as her sodium was still low, she had been placed back on tramadol and had been off of her Xanax since  06/23/2015.  In addition, she was in a nursing facility and likely was not getting alcohol like she previously had been.  She had the EEG on September 20 that was normal.  I also reviewed her MRI of the brain with and without gadolinium done on 07/04/2015 and this was nonacute.  There was atrophy and moderate small vessel disease.  She returned again to the emergency room on September 22 because of melena.  She followed up with gastroenterology because of  this and because of diarrhea.  Her PCR was positive for C. difficile.  She was already on Flagyl, so this was discontinued and she was changed to vancomycin.  Last visit, I did lab work and noticed that she had thrombocytosis and leukocytosis.  She ended up getting a hematology consult and it was felt that this was likely reactive to iron deficiency anemia.  She is on iron supplements.  She is back at home now.  Her husband reports that he has a locking briefcase and he distributes meds to her.  Her husband states that she is no longer drinking alcohol.  Husband states that was off narcotics but then states that she is on butrans patch.  He doesn't think that it is helping so he took it off today.  She has f/u with pain management on Monday (Dr. Andree Elk).  Her husband is bathing her "because of her chronic pain."  She is back to fully dressing herself.    01/25/16 update:  The patient is following up today, accompanied by her husband who supplements the history.  I have reviewed detailed records made available to me.  The patient has a history of alcohol-related dementia.  She is no longer drinking, but chronic pain has been a significant issue.  Last visit, I talked to the patient and her husband about avoiding all narcotics.  She has had a seizure in the past and I felt that this seizure was multifactorial and likely related to acute Xanax withdrawal, alcohol withdrawal and tramadol had been added at the same time.  I told her I did not want her taking any more  tramadol.  Despite this, she went to the emergency room on November 19, December 6, December 18 and December 28 and each time was given tramadol.  Many of these times, her primary care physician called them back after going to the emergency room and wisely stop her tramadol and told her husband that he did not want her on this medication either.  Despite this, her husband continues to give this to her because he feels that this is her only option for pain management.  She apparently has been to the [facility] ER as well.  She has been seeing gastroenterology in regards to nausea.  On 11/01/2015 Dr. Ardis Hughs told her that potentially her nausea was from the Aricept and the patient called me.  I did not think that the Aricept was the culprit as she had been on it a long time, but I also had no objection to her discontinuing as her dementia is really related to alcohol abuse in the past.  Her husband states that he did d/c it for a while and it didn't help (off several weeks).   Husband asks me if nausea could be neurologic.   She did turned out to be positive for C. difficile and that was treated.  The nausea did get somewhat better, but she continues to have chronic nausea.  Her husband states that he thinks that pt had a precipitous decline in cognition 6 weeks ago.  He found her with 6 pairs of panties on a few weeks ago and he is having to fully dress her.  He asks about palliative care.   PREVIOUS MEDICATIONS: none to date   ALLERGIES:   Allergies  Allergen Reactions  . Latex Rash  . Penicillins Rash    .Marland KitchenHas patient had a PCN reaction causing immediate rash, facial/tongue/throat swelling, SOB or lightheadedness with hypotension: No Has patient  had a PCN reaction causing severe rash involving mucus membranes or skin necrosis: No Has patient had a PCN reaction that required hospitalization No Has patient had a PCN reaction occurring within the last 10 years: No If all of the above answers are "NO", then may  proceed with Cephalosporin use.      CURRENT MEDICATIONS:  Current Outpatient Prescriptions on File Prior to Visit  Medication Sig Dispense Refill  . acetaminophen (TYLENOL) 500 MG tablet Take 1-2 tablets (500-1,000 mg total) by mouth every 6 (six) hours as needed for mild pain. (Patient taking differently: Take 650 mg by mouth every 4 (four) hours as needed for mild pain. ) 30 tablet   . amLODipine (NORVASC) 2.5 MG tablet TAKE 1 TABLET BY MOUTH DAILY 30 tablet 5  . atorvastatin (LIPITOR) 10 MG tablet Take 10 mg by mouth daily at 6 PM.     . clopidogrel (PLAVIX) 75 MG tablet TAKE 1 TABLET (75 MG TOTAL) BY MOUTH DAILY. 90 tablet 3  . donepezil (ARICEPT) 10 MG tablet TAKE 1 TABLET BY MOUTH DAILY AT BEDTIME 90 tablet 1  . fluticasone (FLONASE) 50 MCG/ACT nasal spray Place 1-2 sprays into both nostrils daily. (Patient taking differently: Place 1-2 sprays into both nostrils daily as needed for allergies. ) 16 g 6  . hydrocortisone (ANUSOL-HC) 2.5 % rectal cream Apply rectally 2 times daily (Patient taking differently: Place 1 application rectally 2 (two) times daily as needed for hemorrhoids. ) 30 g 0  . meloxicam (MOBIC) 15 MG tablet Take 1 tablet (15 mg total) by mouth daily. 30 tablet 0  . montelukast (SINGULAIR) 10 MG tablet Take 10 mg by mouth at bedtime.     . Multiple Vitamins-Minerals (EYE VITAMINS & MINERALS PO) Take 1 capsule by mouth daily.    . Omega-3 Fatty Acids (FISH OIL) 1200 MG CAPS Take 1 capsule by mouth daily.    Marland Kitchen omeprazole (PRILOSEC) 20 MG capsule TAKE 1 CAPSULE TWICE DAILY FOR 10 DAYS, THEN DECREASE TO NORMAL ONCE DAILY DOSING (Patient taking differently: Take one tablet by mouth nightly.) 30 capsule 5  . ondansetron (ZOFRAN) 8 MG tablet Take 1 tablet (8 mg total) by mouth every 8 (eight) hours as needed for nausea or vomiting. 60 tablet 3  . polyvinyl alcohol (LIQUIFILM TEARS) 1.4 % ophthalmic solution Place 1 drop into both eyes daily as needed for dry eyes (allergies).     . promethazine (PHENERGAN) 25 MG tablet Take 1 tablet (25 mg total) by mouth 2 (two) times daily.    . SYMBICORT 160-4.5 MCG/ACT inhaler INHALE 2 PUFFS INTO THE LUNGS 2 (TWO) TIMES DAILY AS NEEDED (SHORTNESS OF BREATH). 10.2 Inhaler 7  . traMADol (ULTRAM) 50 MG tablet Take 1 tablet (50 mg total) by mouth every 6 (six) hours as needed. (Patient taking differently: Take 100 mg by mouth every 6 (six) hours as needed. )    . vitamin E 400 UNIT capsule Take 400 Units by mouth daily.     No current facility-administered medications on file prior to visit.    PAST MEDICAL HISTORY:   Past Medical History  Diagnosis Date  . Hyperlipidemia   . ETOH abuse   . Allergy   . Diverticulosis   . Hypertension   . Depression   . Asthma   . GERD (gastroesophageal reflux disease)   . Normal 24 hour ambulatory pH monitoring study     good acid suppression  . Dementia   . Shortness of breath   .  Anxiety     panic attacks- on occas.   . Arthritis     osteoporosis - especially hips.   . Stroke ([place])   . Memory loss   . Confusion   . Hyponatremia 07/05/2015  . Observed seizure-like activity ([place]) 07/05/2015    observed by husband , Dr Doy Mince  . Falls     PAST SURGICAL HISTORY:   Past Surgical History  Procedure Laterality Date  . Dobutamine stress echo  07/09/2001    normal  . Esophagogastroduodenoscopy  06/11/2006    normal  . Abdominal hysterectomy    . Total hip arthroplasty      left 1991/right 2008  . Nasal sinus surgery  1990  . Eye surgery      cataracts removed, IOL- both eyes   . Breast surgery      3x- biposies   . Shoulder arthroscopy Left   . Reverse shoulder arthroplasty Right 08/13/2014    Procedure: REVERSE SHOULDER ARTHROPLASTY;  Surgeon: Marin Shutter, MD;  Location: [place];  Service: Orthopedics;  Laterality: Right;  interscalene block  . Joint replacement      bilateral hips  . Loop recorder implant N/A 10/14/2014    Procedure: LOOP RECORDER IMPLANT;  Surgeon:  Evans Lance, MD;  Location: [facility] CATH LAB;  Service: Cardiovascular;  Laterality: N/A;  . Tee without cardioversion N/A 10/14/2014    Procedure: TRANSESOPHAGEAL ECHOCARDIOGRAM (TEE);  Surgeon: Sueanne Margarita, MD;  Location: [facility] ENDOSCOPY;  Service: Cardiovascular;  Laterality: N/A;  . Esophagogastroduodenoscopy (egd) with propofol N/A 08/05/2015    Procedure: ESOPHAGOGASTRODUODENOSCOPY (EGD) WITH PROPOFOL;  Surgeon: [name], MD;  Location: [facility] ENDOSCOPY;  Service: Endoscopy;  Laterality: N/A;  . Colonoscopy with propofol N/A 08/05/2015    Procedure: COLONOSCOPY WITH PROPOFOL;  Surgeon: [name], MD;  Location: [facility] ENDOSCOPY;  Service: Endoscopy;  Laterality: N/A;    SOCIAL HISTORY:   Social History   Social History  . Marital Status: Married    Spouse Name: N/A  . Number of Children: N/A  . Years of Education: N/A   Occupational History  . retired     [employer]   Social History Main Topics  . Smoking status: Never Smoker   . Smokeless tobacco: Never Used  . Alcohol Use: No  . Drug Use: No  . Sexual Activity: Yes   Other Topics Concern  . Not on file   Social History Narrative   Married. Retired.      FAMILY HISTORY:   Family Status  Relation Status Death Age  . Mother Deceased     CAD, PPM  . Father Deceased     CAD  . Sister Alive     3, healthy  . Brother Alive     2, CAD, esophageal CA  . Child Alive     healthy    ROS:  A complete 10 system review of systems was obtained and was unremarkable apart from what is mentioned above.  PHYSICAL EXAMINATION:    VITALS:   Filed Vitals:   01/25/16 1419  BP: 140/80  Pulse: 82  Height: 5\' 7"  (1.702 m)  Weight: 149 lb (67.586 kg)    GEN:  The patient appears stated age and is in NAD. HEENT:  Normocephalic, atraumatic.  The mucous membranes are moist. The superficial temporal arteries are without ropiness or tenderness. CV:  RRR Lungs:  CTAB Neck/HEME:  There are no carotid bruits  bilaterally.  Neurological examination:  Orientation: She  is alert, but not oriented to anything but person.   Previous MoCA scores were 7 and 17.   Cranial nerves: There is good facial symmetry. . Extraocular muscles are intact. The visual fields are full to confrontational testing. The speech is fluent and clear. Soft palate rises symmetrically and there is no tongue deviation. Hearing is intact to conversational tone. Sensation: Sensation is intact to light touch throughout. Motor: Strength is 5/5 in the bilateral upper and lower extremities.   Shoulder shrug is equal and symmetric.  There is no pronator drift. Frontal release:  There is a bilateral palmomental response  Movement examination: Tone: There is normal tone in the bilateral upper extremities.  The tone in the lower extremities is normal.  Abnormal movements: There was no tremor today.    No dyskinesia.  No asterixis. Coordination:  There is no difficulty with rapid alternating movements today. Gait and Station: The patient has no difficulty arising out of a deep-seated chair without the use of the hands. The patient's stride length is normal. Arm swing is good.    LABS:  Lab Results  Component Value Date   VITAMINB12 1206* 06/24/2015   Lab Results  Component Value Date   FOLATE 19.5 06/24/2015   No results found for: RPR   Chemistry      Component Value Date/Time   NA 134* 12/06/2015 1558   K 4.2 12/06/2015 1558   CL 97 12/06/2015 1558   CO2 30 12/06/2015 1558   BUN 18 12/06/2015 1558   CREATININE 1.04 12/06/2015 1558   CREATININE 1.18* 05/22/2014 1211      Component Value Date/Time   CALCIUM 10.2 12/06/2015 1558   ALKPHOS 64 12/06/2015 1558   AST 26 12/06/2015 1558   ALT 18 12/06/2015 1558   BILITOT 0.2 12/06/2015 1558     Lab Results  Component Value Date   WBC 10.2 01/05/2016   HGB 12.7 01/05/2016   HCT 37.5 01/05/2016   MCV 80.4 01/05/2016   PLT 702* 01/05/2016     ASSESSMENT/PLAN  1.  Mild  parkinsonism, likely due to Abilify.  I do not think that she has idiopathic Parkinson's disease.  -This has resolved off of the Abilify.  Does have some chin tremor and hand tremor but still no evidence of PD.  Husband states that daughter asks about Lewy Body dementia but I explained that she does not have that. 2.  EtOH dementia  -Is stable.  Now not drinking at all.  Unfortunately, I think that she has changed this out for an overuse of pain medication.  -Discussed the Aricept with her husband.  They thought that perhaps this is causing some nausea and discontinued the Aricept for about a month.  No change in the nausea.  They have restarted it.  Her husband asked me if neurologic issues could cause the nausea.  Her brain was scanned in September, 2016 and did not reveal any acute issues.  -Husband feels that she is on a precipitous decline.  I would do brief neuropsych and compared that to her neuropsych testing in September, 2015.  I am doing this primarily because they would like to get her into palliative care.  -We will check EEG just to make sure we are not missing anything. 3.  Seizure, 06/2015  -multifactorial, xanax withdrawal and EtOH w/d (was in SNF at time), addition of tramadol as well as hyponatremia felt to be factors.  No meds indicated.  Seizure/safety discussed 4.  Chronic pain  -  Long discussion with the patient and her husband.  As above, I do not think it is appropriate for her to be on tramadol.  Both her primary care physician and myself have talked to her about this.  She has had a seizure in the past, which was likely multifactorial, but nonetheless she should not be on tramadol. Her husband admits that multiple physicians have talked to him about this but he is willing to take the risk.   I have added this to her allergy list so that hopefully when it is prescribed in our system, this will at least be flagged.  I noted when I added this that this was previously added in  November, 2016 but her family had asked that it be removed.  I added it again, and told her husband that I do not think it is appropriate for this medication to be prescribed and I think she is at high risk for seizure. 5.  Iron deficiency anemia with reactive thrombocytosis  -on iron supplement 6.  They are going to f/u with PCP per husband but will let me know if needed in future.

## 2016-01-25 NOTE — Telephone Encounter (Signed)
I'll work on the hard copy.  I was out of clinic today.  Thanks.

## 2016-01-25 NOTE — Telephone Encounter (Signed)
A form has to be filled out by you to fax to [facility]osp[facility].  Patient lives in [place] so this is the correct form.  Placed in your In Box.  Notation is made that patient cannot have [place] and Hospice simultaneously.

## 2016-01-26 NOTE — Telephone Encounter (Signed)
Form done. Thanks. 

## 2016-01-26 NOTE — Telephone Encounter (Signed)
Completed form faxed as instructed.  

## 2016-01-27 DIAGNOSIS — J3081 Allergic rhinitis due to animal (cat) (dog) hair and dander: Secondary | ICD-10-CM | POA: Diagnosis not present

## 2016-01-27 DIAGNOSIS — J3089 Other allergic rhinitis: Secondary | ICD-10-CM | POA: Diagnosis not present

## 2016-01-27 NOTE — Telephone Encounter (Signed)
Vicky at [facility] advised.

## 2016-01-27 NOTE — Telephone Encounter (Signed)
Please give the order.  Thanks.   

## 2016-01-27 NOTE — Telephone Encounter (Signed)
Aimee Jones at [facility] called.  She spoke with patient's husband and he told her patient didn't need Hospice, but she does need Palliative care.  Aimee Jones would like the order changed to Palliative care  for pain management. Olegario Shearer is asking for a verbal order.  Please call Aimee Jones back at [phone].

## 2016-01-31 DIAGNOSIS — M4802 Spinal stenosis, cervical region: Secondary | ICD-10-CM | POA: Diagnosis not present

## 2016-02-01 DIAGNOSIS — J3089 Other allergic rhinitis: Secondary | ICD-10-CM | POA: Diagnosis not present

## 2016-02-01 DIAGNOSIS — J3081 Allergic rhinitis due to animal (cat) (dog) hair and dander: Secondary | ICD-10-CM | POA: Diagnosis not present

## 2016-02-02 DIAGNOSIS — M545 Low back pain: Secondary | ICD-10-CM | POA: Diagnosis not present

## 2016-02-02 DIAGNOSIS — G894 Chronic pain syndrome: Secondary | ICD-10-CM | POA: Diagnosis not present

## 2016-02-02 DIAGNOSIS — M792 Neuralgia and neuritis, unspecified: Secondary | ICD-10-CM | POA: Diagnosis not present

## 2016-02-02 DIAGNOSIS — Z79899 Other long term (current) drug therapy: Secondary | ICD-10-CM | POA: Diagnosis not present

## 2016-02-02 DIAGNOSIS — Z79891 Long term (current) use of opiate analgesic: Secondary | ICD-10-CM | POA: Diagnosis not present

## 2016-02-02 DIAGNOSIS — M542 Cervicalgia: Secondary | ICD-10-CM | POA: Diagnosis not present

## 2016-02-03 ENCOUNTER — Telehealth: Payer: Self-pay

## 2016-02-03 DIAGNOSIS — R531 Weakness: Secondary | ICD-10-CM | POA: Diagnosis not present

## 2016-02-03 NOTE — Telephone Encounter (Signed)
Josh NP with Palliative Care saw pt today and thinks pt displays depression symptoms; No SI/HI; Josh request antidepressant prescribed for pt; possibly cymbalta. Pt last seen by Dr [name] on 12/06/15. Josh also wanted Dr [name] to know that pt was seen at pain mgt on 02/02/16 and pt was prescribed fentanyl patch,gabapentin and percocet.  Josh request cb to pts husband, Gwyndolyn Saxon at [phone].

## 2016-02-04 NOTE — Telephone Encounter (Signed)
Needs 30 min OV.  I need them to bring all meds to the OV.  Thanks.

## 2016-02-04 NOTE — Telephone Encounter (Signed)
Appointment scheduled.

## 2016-02-05 ENCOUNTER — Other Ambulatory Visit: Payer: Self-pay | Admitting: Family Medicine

## 2016-02-07 ENCOUNTER — Ambulatory Visit (INDEPENDENT_AMBULATORY_CARE_PROVIDER_SITE_OTHER): Payer: Medicare Other | Admitting: *Deleted

## 2016-02-07 DIAGNOSIS — I639 Cerebral infarction, unspecified: Secondary | ICD-10-CM | POA: Diagnosis not present

## 2016-02-08 ENCOUNTER — Ambulatory Visit (INDEPENDENT_AMBULATORY_CARE_PROVIDER_SITE_OTHER): Payer: Medicare Other | Admitting: Family Medicine

## 2016-02-08 ENCOUNTER — Encounter: Payer: Self-pay | Admitting: Family Medicine

## 2016-02-08 VITALS — BP 128/80 | HR 92 | Temp 98.3°F | Wt 148.8 lb

## 2016-02-08 DIAGNOSIS — J3089 Other allergic rhinitis: Secondary | ICD-10-CM | POA: Diagnosis not present

## 2016-02-08 DIAGNOSIS — F329 Major depressive disorder, single episode, unspecified: Secondary | ICD-10-CM

## 2016-02-08 DIAGNOSIS — F32A Depression, unspecified: Secondary | ICD-10-CM

## 2016-02-08 DIAGNOSIS — J3081 Allergic rhinitis due to animal (cat) (dog) hair and dander: Secondary | ICD-10-CM | POA: Diagnosis not present

## 2016-02-08 DIAGNOSIS — I639 Cerebral infarction, unspecified: Secondary | ICD-10-CM | POA: Diagnosis not present

## 2016-02-08 MED ORDER — CITALOPRAM HYDROBROMIDE 20 MG PO TABS: 20.0000 mg | ORAL_TABLET | Freq: Every day | ORAL | Status: DC

## 2016-02-08 MED ORDER — LANSOPRAZOLE 15 MG PO CPDR: 15.0000 mg | DELAYED_RELEASE_CAPSULE | Freq: Every day | ORAL | Status: DC

## 2016-02-08 NOTE — Patient Instructions (Signed)
Stop prilosec, change to pantoprazole.  Start citalopram in the meantime.  I'll await input from your other docs.  Take care.  Update me in about 4 weeks, sooner if needed.

## 2016-02-08 NOTE — Progress Notes (Signed)
[facility] Summary Report / Loop Recorder 

## 2016-02-08 NOTE — Progress Notes (Signed)
Pre visit review using our clinic review tool, if applicable. No additional management support is needed unless otherwise documented below in the visit note.  H/o etoh and dementia, with SZ concern noted.  Prev notes reviewed during OV.  Has f/u with neuro pending for EEG.  Has f/u with pain clinic pending, still with sig neck pain, with no plan for surgery from [facility] clinic- reviewed at the [place].  Mood is worse, likely exacerbated by pain.  Tearful.  No SI/HI.  Still at home with husband.  On mult other meds, listed in EMR, updated.   Back on opiates per outside clinic.  Not on tramadol.  Prev on citalopram, w/o ADE prev, has been off for extended time.  Still on omeprazole in the meantime.    PMH and SH reviewed  ROS: See HPI, otherwise noncontributory.  Meds, vitals, and allergies reviewed.   GEN: nad, alert and oriented, tearful but regains composure HEENT: mucous membranes moist NECK: pain with ROM w/o LA CV: rrr.  PULM: ctab, no inc wob ABD: soft, +bs

## 2016-02-09 ENCOUNTER — Encounter: Payer: Self-pay | Admitting: Family Medicine

## 2016-02-09 ENCOUNTER — Ambulatory Visit (INDEPENDENT_AMBULATORY_CARE_PROVIDER_SITE_OTHER): Payer: Medicare Other | Admitting: Neurology

## 2016-02-09 DIAGNOSIS — R42 Dizziness and giddiness: Secondary | ICD-10-CM | POA: Diagnosis not present

## 2016-02-09 NOTE — Assessment & Plan Note (Signed)
Likely multifactorial, would avoid cymbalta for now given the h/o etoh and SZ concern.  Has been on SSRI prev w/o ADE.  Would restart citalopram but change PPI to avoid interaction, EMR updated, d/w pt and husband.  Both understood.  Routine timeline of effect discussed, they'll update me as needed, certainly if mood is worsening.  Okay for outpatient f/u at this point.  No SI/HI. >25 minutes spent in face to face time with patient, >50% spent in counselling or coordination of care.

## 2016-02-10 ENCOUNTER — Telehealth: Payer: Self-pay | Admitting: Neurology

## 2016-02-10 NOTE — Procedures (Signed)
TECHNICAL SUMMARY:  A multichannel referential and bipolar montage EEG using the standard international 10-20 system was performed on the patient described as awake, drowsy and asleep.  The dominant background activity consists of 7-8 hertz activity seen most prominantly over the posterior head region.  The backgound activity is nonreactive to eye opening and closing procedures.  Low voltage fast (beta) activity is distributed symmetrically and maximally over the anterior head regions.  ACTIVATION:  Stepwise photic stimulation at 4-20 flashes per second was performed and did not elicit any abnormal waveforms.  Hyperventilation was performed for 3 minutes with good patient effort and produced no changes in the background activity.  EPILEPTIFORM ACTIVITY:  There were no spikes, sharp waves or paroxysmal activity.  SLEEP:  Physiologic drowsiness and stage II sleep are noted  CARDIAC:  The EKG lead revealed a regular sinus rhythm.  IMPRESSION:  This is an abnormal EEG demonstrating a mild diffuse slowing of electrocerebral activity.  This can be seen in a wide variety of encephalopathic state including those of a toxic, metabolic, or degenerative nature.  There were no focal, hemispheric, or lateralizing features.  No epileptiform activity was recorded.

## 2016-02-10 NOTE — Telephone Encounter (Signed)
Patient's husband made aware of results.

## 2016-02-10 NOTE — Telephone Encounter (Signed)
-----   Message from [place], DO sent at 02/10/2016  9:03 AM EDT ----- You can let pts husb know that EEG did not show seizure waves; just a little slow

## 2016-02-11 DIAGNOSIS — J3089 Other allergic rhinitis: Secondary | ICD-10-CM | POA: Diagnosis not present

## 2016-02-11 DIAGNOSIS — J3081 Allergic rhinitis due to animal (cat) (dog) hair and dander: Secondary | ICD-10-CM | POA: Diagnosis not present

## 2016-02-17 DIAGNOSIS — J3089 Other allergic rhinitis: Secondary | ICD-10-CM | POA: Diagnosis not present

## 2016-02-17 DIAGNOSIS — J3081 Allergic rhinitis due to animal (cat) (dog) hair and dander: Secondary | ICD-10-CM | POA: Diagnosis not present

## 2016-02-17 DIAGNOSIS — M5412 Radiculopathy, cervical region: Secondary | ICD-10-CM | POA: Diagnosis not present

## 2016-02-20 ENCOUNTER — Other Ambulatory Visit: Payer: Self-pay | Admitting: Gastroenterology

## 2016-02-22 DIAGNOSIS — J3081 Allergic rhinitis due to animal (cat) (dog) hair and dander: Secondary | ICD-10-CM | POA: Diagnosis not present

## 2016-02-22 DIAGNOSIS — J3089 Other allergic rhinitis: Secondary | ICD-10-CM | POA: Diagnosis not present

## 2016-02-25 LAB — CUP PACEART REMOTE DEVICE CHECK: MDC IDC SESS DTM: 20170224003808

## 2016-02-25 NOTE — Progress Notes (Signed)
Carelink summary report received. Battery status OK. Normal device function. No new symptom episodes, tachy episodes, brady, or pause episodes. No new AF episodes. Monthly summary reports and ROV/PRN 

## 2016-02-29 DIAGNOSIS — J3081 Allergic rhinitis due to animal (cat) (dog) hair and dander: Secondary | ICD-10-CM | POA: Diagnosis not present

## 2016-02-29 DIAGNOSIS — J3089 Other allergic rhinitis: Secondary | ICD-10-CM | POA: Diagnosis not present

## 2016-03-01 DIAGNOSIS — Z79899 Other long term (current) drug therapy: Secondary | ICD-10-CM | POA: Diagnosis not present

## 2016-03-01 DIAGNOSIS — M792 Neuralgia and neuritis, unspecified: Secondary | ICD-10-CM | POA: Diagnosis not present

## 2016-03-01 DIAGNOSIS — M545 Low back pain: Secondary | ICD-10-CM | POA: Diagnosis not present

## 2016-03-01 DIAGNOSIS — G894 Chronic pain syndrome: Secondary | ICD-10-CM | POA: Diagnosis not present

## 2016-03-01 DIAGNOSIS — M542 Cervicalgia: Secondary | ICD-10-CM | POA: Diagnosis not present

## 2016-03-01 DIAGNOSIS — Z79891 Long term (current) use of opiate analgesic: Secondary | ICD-10-CM | POA: Diagnosis not present

## 2016-03-07 DIAGNOSIS — J3081 Allergic rhinitis due to animal (cat) (dog) hair and dander: Secondary | ICD-10-CM | POA: Diagnosis not present

## 2016-03-07 DIAGNOSIS — J3089 Other allergic rhinitis: Secondary | ICD-10-CM | POA: Diagnosis not present

## 2016-03-08 ENCOUNTER — Ambulatory Visit (INDEPENDENT_AMBULATORY_CARE_PROVIDER_SITE_OTHER): Payer: Medicare Other | Admitting: *Deleted

## 2016-03-08 DIAGNOSIS — I639 Cerebral infarction, unspecified: Secondary | ICD-10-CM

## 2016-03-09 NOTE — Progress Notes (Signed)
[facility] Summary Report / Loop Recorder 

## 2016-03-11 LAB — CUP PACEART REMOTE DEVICE CHECK: Date Time Interrogation Session: 20170326010705

## 2016-03-11 NOTE — Progress Notes (Signed)
Carelink summary report received. Battery status OK. Normal device function. No new symptom episodes, tachy episodes, brady, or pause episodes. No new AF episodes. Monthly summary reports and ROV/PRN 

## 2016-03-13 LAB — CUP PACEART REMOTE DEVICE CHECK: MDC IDC SESS DTM: 20170425010707

## 2016-03-13 NOTE — Progress Notes (Signed)
Carelink summary report received. Battery status OK. Normal device function. No new symptom episodes, tachy episodes, brady, or pause episodes. No new AF episodes. Monthly summary reports and ROV/PRN 

## 2016-03-14 DIAGNOSIS — J3089 Other allergic rhinitis: Secondary | ICD-10-CM | POA: Diagnosis not present

## 2016-03-14 DIAGNOSIS — J3081 Allergic rhinitis due to animal (cat) (dog) hair and dander: Secondary | ICD-10-CM | POA: Diagnosis not present

## 2016-03-15 DIAGNOSIS — H40013 Open angle with borderline findings, low risk, bilateral: Secondary | ICD-10-CM | POA: Diagnosis not present

## 2016-03-16 DIAGNOSIS — J4530 Mild persistent asthma, uncomplicated: Secondary | ICD-10-CM | POA: Diagnosis not present

## 2016-03-16 DIAGNOSIS — J3081 Allergic rhinitis due to animal (cat) (dog) hair and dander: Secondary | ICD-10-CM | POA: Diagnosis not present

## 2016-03-16 DIAGNOSIS — H1045 Other chronic allergic conjunctivitis: Secondary | ICD-10-CM | POA: Diagnosis not present

## 2016-03-16 DIAGNOSIS — J3089 Other allergic rhinitis: Secondary | ICD-10-CM | POA: Diagnosis not present

## 2016-03-21 ENCOUNTER — Other Ambulatory Visit: Payer: Self-pay | Admitting: Family Medicine

## 2016-03-21 DIAGNOSIS — J3081 Allergic rhinitis due to animal (cat) (dog) hair and dander: Secondary | ICD-10-CM | POA: Diagnosis not present

## 2016-03-21 DIAGNOSIS — J3089 Other allergic rhinitis: Secondary | ICD-10-CM | POA: Diagnosis not present

## 2016-03-22 DIAGNOSIS — M19019 Primary osteoarthritis, unspecified shoulder: Secondary | ICD-10-CM | POA: Diagnosis not present

## 2016-03-22 DIAGNOSIS — M25519 Pain in unspecified shoulder: Secondary | ICD-10-CM | POA: Diagnosis not present

## 2016-03-23 ENCOUNTER — Ambulatory Visit (INDEPENDENT_AMBULATORY_CARE_PROVIDER_SITE_OTHER): Payer: Medicare Other | Admitting: Psychology

## 2016-03-23 DIAGNOSIS — F1027 Alcohol dependence with alcohol-induced persisting dementia: Secondary | ICD-10-CM

## 2016-03-23 DIAGNOSIS — F1097 Alcohol use, unspecified with alcohol-induced persisting dementia: Secondary | ICD-10-CM

## 2016-03-23 NOTE — Progress Notes (Signed)
NEUROPSYCHOLOGICAL INTERVIEW (CPT: [id number])  Name: Aimee Jones Date of Birth: Oct 08, 1946 Date of Interview: 03/23/2016  Reason for Referral: Re-eval Aimee Jones is a 70 y.o. female who is referred for neuropsychological re-evaluation by Dr. Wells Guiles Jones of [facility] due to concerns about memory loss. I previously evaluated this patient at [facility] in 05/2014 (report is scanned in EPIC). 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 Since her last evaluation, the patient's husband has reported a precipitous decline in cognitive function and may be seeking palliative care for his wife. As such, re-evaluation was requested by Dr. Carles Jones in order to document current functioning. This patient is accompanied in the office by her husband who provides the history.  History of Presenting Problem:  Please review my report from [facility] dated 06/24/2014 for full details regarding this patient's history. Briefly, the patient has a long history of alcohol abuse and dependence. She attended a 28-day rehab program in 2008 but relapsed soon afterwards. Her husband stopped providing alcohol to her in 2015 and she reportedly has not had any since. The patient also has a history of chronic pain and there has been some question in the past year if she had replaced alcohol addiction with an addiction to opiates and specifically tramadol. She is no longer taking tramadol and is now prescribed oxycodone. She continues to complain of severe pain daily.  With regard to cognitive changes, Aimee Jones reported that he first noticed mild symptoms in his wife around 46 or 2000. He recalls the patient having some difficulty following conversations around that time. However, he became concerned about her cognitive functioning in 2010 when she could no longer read maps on road trips and vacations. The patient's cognition  reportedly continued to deteriorate over the past seven years with more rapid progression in the past 1-2 years. 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 I could not rule out AD.   Aimee Jones lives in her own home with her husband. The patient has been unable to complete complex IADLs for a few years now. Her husband manages all of these and also provides 24/7 supervision of the patient. He has been reluctant to hire caregiver assistance. She manages basic ADLs, including eating and toileting, independently. Her husband reported that she has not had incontinence problems recently. She is usually able to dress herself but on occasion she has had difficulty. For example, recently, she had put on multiple pairs of panties. That never happened again. She has not had any falls in the past six months, per her husband.  The patient spends her days looking at family photos (her husband noted they have hundreds of framed photos in their home for her to look at), walking in her neighborhood, talking to family members on the phone, and watching television. Her husband sometimes takes her on long drives in the country to take her mind off her pain.  The patient has started to have some difficulty differentiating family members and family relationships. For example, she may ask "Who was Aimee Jones's mother? Who was Aimee Jones's mother?" Aimee Jones and Aimee Jones are her siblings and had the same mother as she did. She sometimes is unsure which grandchild belongs to which child. She has not demonstrated any problems recognizing her husband.  The patient reported that her mood is depressed secondary to her pain. Her husband noted that he has been concerned about her mood. She has been taking citalopram and he thinks it may be helping somewhat. She is not crying as much. During today's  interview, the patient denies any suicidal ideation stating "that's a sin". Per her husband, she does sometimes say she wishes she was dead, but has never engaged in self harm behavior.   She does become agitated and angry if her pain is bad and her husband has given her all the pain medication he is allowed to give her. She gets verbally aggressive but not physically aggressive. He keeps good records of how much medication he is giving her and at exactly what time. She is prescribed oxycodone two times a day. He reported that some days he is able to get by without giving her any but that some days he may have to give it to her three times in one day. He has not tried giving a placebo to see if this would help her agitation/anxiety.   Sleep is often an issue. She frequently wakes up at least hourly in pain and requesting medication from her husband. This of course affects his sleep, too. He noted that she was taking a liquid form of dramamine to help with sleep but that it started causing diarrhea so they stopped it.   Her appetite is good, and she eats three meals a day.   Her husband has not observed any hallucinations or paranoid ideation. She does often confabulate.   Of note, since my evaluation in 05/2014, the patient did suffer a stroke in 09/2014, and then reportedly had a TIA eight days later (per her husband). She had a fall in 2016 and was hospitalized. While undergoing inpatient rehabilitation, she had a seizure.   Social History: Born/Raised: [employer] Education: High school and some college (13 years total) Occupational history: Retired Web designer Marital history: Married to her husband since she was 14yo. They have two adult children and several grandchildren.  Alcohol/Tobacco/Substances: None at the present time. Stopped drinking in 2015.  Medical History: Past Medical History  Diagnosis Date  . Hyperlipidemia   . ETOH abuse   . Allergy   . Diverticulosis     . Hypertension   . Depression   . Asthma   . GERD (gastroesophageal reflux disease)   . Normal 24 hour ambulatory pH monitoring study     good acid suppression  . Dementia   . Shortness of breath   . Anxiety     panic attacks- on occas.   . Arthritis     osteoporosis - especially hips.   . Stroke ([place])   . Memory loss   . Confusion   . Hyponatremia 07/05/2015  . Observed seizure-like activity ([place]) 07/05/2015    observed by husband , Dr Doy Mince  . Falls     Current Medications:  Outpatient Encounter Prescriptions as of 03/23/2016  Medication Sig  . acetaminophen (TYLENOL) 500 MG tablet Take 1-2 tablets (500-1,000 mg total) by mouth every 6 (six) hours as needed for mild pain. (Patient taking differently: Take 650 mg by mouth every 4 (four) hours as needed for mild pain. )  . amLODipine (NORVASC) 2.5 MG tablet TAKE 1 TABLET BY MOUTH DAILY  . atorvastatin (LIPITOR) 10 MG tablet Take 10 mg by mouth daily at 6 PM.   . citalopram (CELEXA) 20 MG tablet Take 1 tablet (20 mg total) by  mouth daily.  . clopidogrel (PLAVIX) 75 MG tablet TAKE 1 TABLET (75 MG TOTAL) BY MOUTH DAILY.  Marland Kitchen donepezil (ARICEPT) 10 MG tablet TAKE 1 TABLET BY MOUTH DAILY AT BEDTIME  . fentaNYL (DURAGESIC - DOSED MCG/HR) 25 MCG/HR patch Place 25 mcg onto the skin every 3 (three) days.  . fluticasone (FLONASE) 50 MCG/ACT nasal spray Place 1-2 sprays into both nostrils daily. (Patient taking differently: Place 1-2 sprays into both nostrils daily as needed for allergies. )  . gabapentin (NEURONTIN) 300 MG capsule Take 300 mg by mouth. Take 1 capsule at supper and 2 capsules at bedtime.  . hydrocortisone (ANUSOL-HC) 2.5 % rectal cream Apply rectally 2 times daily (Patient taking differently: Place 1 application rectally 2 (two) times daily as needed for hemorrhoids. )  . lansoprazole (PREVACID) 15 MG capsule Take 1 capsule (15 mg total) by mouth daily.  . meloxicam (MOBIC) 15 MG tablet Take 1 tablet (15 mg total) by mouth  daily.  . montelukast (SINGULAIR) 10 MG tablet Take 10 mg by mouth at bedtime.   . Multiple Vitamins-Minerals (EYE VITAMINS) CAPS Take 1 capsule by mouth daily.  . Omega-3 Fatty Acids (FISH OIL) 1200 MG CAPS Take 1 capsule by mouth daily.  . ondansetron (ZOFRAN) 8 MG tablet TAKE 1 TABLET BY MOUTH EVERY 8 HOURS AS NEEDED FOR NAUSEA OR VOMITING  . oxyCODONE-acetaminophen (PERCOCET/ROXICET) 5-325 MG tablet Take 1 tablet by mouth 2 (two) times daily.  . polyvinyl alcohol (LIQUIFILM TEARS) 1.4 % ophthalmic solution Place 1 drop into both eyes daily as needed for dry eyes (allergies).  . promethazine (PHENERGAN) 25 MG tablet Take 1 tablet (25 mg total) by mouth 2 (two) times daily.  . SYMBICORT 160-4.5 MCG/ACT inhaler INHALE 2 PUFFS INTO THE LUNGS 2 (TWO) TIMES DAILY AS NEEDED (SHORTNESS OF BREATH).  Marland Kitchen vitamin E 400 UNIT capsule Take 400 Units by mouth daily.   No facility-administered encounter medications on file as of 03/23/2016.   Behavioral Observations:   Appearance: Neatly and appropriately dressed and groomed Gait: Ambulated independently, mild unsteadiness observed Speech: Fluent; normal rate, rhythm and volume Thought process: Tangential, providing information spontaneously that does not pertain to what we are discussing Affect: Full, generally euthymic Interpersonal: Pleasant, polite   TESTING: There is medical necessity to proceed with neuropsychological assessment as the results will be used to aid in differential diagnosis and clinical decision-making and to inform specific treatment recommendations. Per the patient's husband and medical records reviewed, there has been a rapid change in cognitive functioning since her last evaluation and she has had a stroke since her last evaluation.    PLAN: The patient will complete a neuropsychological testing battery with our psychometrician next week. Subsequently, she and her husband will also return for a follow-up session with this provider  at which time her test performances and my impressions and treatment recommendations will be reviewed in detail.   Full neuropsychological evaluation report to follow.  Total face to face time spent in clinical interview: 60 minutes (CPT: [id number])

## 2016-03-25 ENCOUNTER — Other Ambulatory Visit: Payer: Self-pay | Admitting: Family Medicine

## 2016-03-27 ENCOUNTER — Encounter: Payer: Self-pay | Admitting: Psychology

## 2016-03-27 ENCOUNTER — Encounter: Payer: Self-pay | Admitting: Family Medicine

## 2016-03-27 ENCOUNTER — Other Ambulatory Visit: Payer: Self-pay | Admitting: Family Medicine

## 2016-03-27 ENCOUNTER — Ambulatory Visit (INDEPENDENT_AMBULATORY_CARE_PROVIDER_SITE_OTHER): Payer: Medicare Other | Admitting: Psychology

## 2016-03-27 DIAGNOSIS — Z79899 Other long term (current) drug therapy: Secondary | ICD-10-CM | POA: Diagnosis not present

## 2016-03-27 DIAGNOSIS — G894 Chronic pain syndrome: Secondary | ICD-10-CM | POA: Diagnosis not present

## 2016-03-27 DIAGNOSIS — F1027 Alcohol dependence with alcohol-induced persisting dementia: Secondary | ICD-10-CM

## 2016-03-27 DIAGNOSIS — M545 Low back pain: Secondary | ICD-10-CM | POA: Diagnosis not present

## 2016-03-27 DIAGNOSIS — G5600 Carpal tunnel syndrome, unspecified upper limb: Secondary | ICD-10-CM

## 2016-03-27 DIAGNOSIS — M542 Cervicalgia: Secondary | ICD-10-CM | POA: Diagnosis not present

## 2016-03-27 DIAGNOSIS — M792 Neuralgia and neuritis, unspecified: Secondary | ICD-10-CM | POA: Diagnosis not present

## 2016-03-27 DIAGNOSIS — F1097 Alcohol use, unspecified with alcohol-induced persisting dementia: Secondary | ICD-10-CM | POA: Diagnosis not present

## 2016-03-27 DIAGNOSIS — Z79891 Long term (current) use of opiate analgesic: Secondary | ICD-10-CM | POA: Diagnosis not present

## 2016-03-28 DIAGNOSIS — J3081 Allergic rhinitis due to animal (cat) (dog) hair and dander: Secondary | ICD-10-CM | POA: Diagnosis not present

## 2016-03-28 DIAGNOSIS — J3089 Other allergic rhinitis: Secondary | ICD-10-CM | POA: Diagnosis not present

## 2016-03-30 DIAGNOSIS — G5621 Lesion of ulnar nerve, right upper limb: Secondary | ICD-10-CM | POA: Diagnosis not present

## 2016-04-04 ENCOUNTER — Ambulatory Visit (INDEPENDENT_AMBULATORY_CARE_PROVIDER_SITE_OTHER): Payer: Medicare Other | Admitting: Psychology

## 2016-04-04 ENCOUNTER — Encounter: Payer: Self-pay | Admitting: Psychology

## 2016-04-04 DIAGNOSIS — F1027 Alcohol dependence with alcohol-induced persisting dementia: Secondary | ICD-10-CM

## 2016-04-04 DIAGNOSIS — F1097 Alcohol use, unspecified with alcohol-induced persisting dementia: Secondary | ICD-10-CM

## 2016-04-04 DIAGNOSIS — J3089 Other allergic rhinitis: Secondary | ICD-10-CM | POA: Diagnosis not present

## 2016-04-04 DIAGNOSIS — J3081 Allergic rhinitis due to animal (cat) (dog) hair and dander: Secondary | ICD-10-CM | POA: Diagnosis not present

## 2016-04-04 NOTE — Progress Notes (Signed)
NEUROPSYCHOLOGICAL EVALUATION   Name:    Aimee Jones  Date of Birth:   [birth year] Date of Interview:  03/23/2016   Date of Evaluation:  03/27/2016   Date of Feedback:  04/04/2016     Background Information:  Reason for Referral:  Aimee Jones is a 70 y.o. female referred by Dr. Wells Guiles Tat to assess her current level of cognitive functioning and assist in differential diagnosis. The current evaluation consisted of a review of available medical records, an interview with the patient and her husband, and the completion of a neuropsychological testing battery. Informed consent was obtained.  History of Presenting Problem:  I previously evaluated this patient at [facility] in 05/2014 (report is scanned in EPIC). At her last appointment with Dr. Carles Collet, the patient's husband reported a precipitous decline in cognitive function over the prior 6 weeks and noted that he may be seeking palliative care/hospice for his wife. As such, re-evaluation was requested by Dr. Carles Collet in order to document current functioning.   Please review my report from [facility] dated 06/24/2014 for full details regarding this patient's history. Briefly, the patient has a long history of alcohol abuse and dependence. She attended a 28-day rehab program in 2008 but relapsed soon afterwards. Her husband stopped providing alcohol to her in 2015 and she reportedly has not had any since. The patient also has a history of chronic pain and there has been some question in the past year if she had replaced alcohol addiction with an addiction to opiates and specifically tramadol. She is no longer taking tramadol and is now prescribed oxycodone. She continues to complain of severe pain daily.  With regard to cognitive changes, Mr. Berendsen reported that he first noticed mild symptoms in his wife around 38 or 2000. He recalls the patient having some difficulty following conversations around that time.  However, he became concerned about her cognitive functioning in 2010 when she could no longer read maps on road trips and vacations. The patient's cognition reportedly continued to deteriorate over the past seven years with more rapid progression in the past 1-2 years. 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 I could not rule out AD.   Mrs. Pico lives in her own home with her husband. The patient has been unable to complete complex IADLs for a few years now. Her husband manages all of these and also provides 24/7 supervision of the patient. He has been reluctant to hire caregiver assistance. She manages basic ADLs, including eating and toileting, independently. Her husband reported that she has not had incontinence problems recently. She is usually able to dress herself but on occasion she has had difficulty. For example, recently, she had put on multiple pairs of panties. That never happened again. She has not had any falls in the past six months, per her husband.  The patient spends her days looking at family photos (her husband noted they have hundreds of framed photos in their home for her to look at), walking in her neighborhood, talking to family members on the phone, and watching television. Her husband sometimes takes her on long drives [place] the country to take her mind off her pain. He noted that distraction seems to work well for managing her subjective complaints of pain.  The patient has started to have some difficulty  differentiating family members and family relationships. For example, she may ask "Who was Timmy's mother? Who was Beth's mother?" Timmy and Eustaquio Maize are her siblings and had the same mother as she did. She sometimes is unsure which grandchild belongs to which child. She has not demonstrated any problems recognizing her husband.   The  patient reported that her mood is depressed secondary to her pain. Her husband noted that he has been concerned about her mood. She has been taking citalopram and he thinks it may be helping somewhat. She is not crying as much. During today's interview, the patient denies any suicidal ideation stating "that's a sin". Per her husband, she does sometimes say she wishes she was dead, but has never engaged in self harm behavior.   She does become agitated and angry if her pain is bad and her husband has given her all the pain medication he is allowed to give her. She gets verbally aggressive but not physically aggressive. He keeps good records of how much medication he is giving her and at exactly what time. She is prescribed oxycodone two times a day. He reported that some days he is able to get by without giving her any but that some days he may have to give it to her three times in one day. He has not tried giving a placebo to see if this would help her agitation/anxiety.   Sleep is often an issue. She frequently wakes up at least hourly in pain and requesting medication from her husband. This of course affects his sleep, too. He noted that she was taking a liquid form of dramamine to help with sleep but that it started causing diarrhea so they stopped it.   Her appetite is good, and she eats three meals a day.   Her husband has not observed any hallucinations or paranoid ideation. She does often confabulate. She engages in some behaviors that are difficult for her husband to manage. For example, she puts hand towels in the washing machine after one use, so he is constantly having to wash towels. She also often takes clothes from the dryer and immediately puts them in the washer again. He has to turn off the circuit breakers to keep her from running multiple loads of laundry.   Of note, since my evaluation in 05/2014, the patient did suffer a stroke in 09/2014, and then reportedly had a TIA eight days later  (per her husband). She had a fall in 2016 and was hospitalized. While undergoing inpatient rehabilitation, she had a seizure.   Social History: Born/Raised: [employer] Education: High school and some college (13 years total) Occupational history: Retired Web designer Marital history: Married to her husband since she was 22yo. They have two adult children and several grandchildren.  Alcohol/Tobacco/Substances: None at the present time. Stopped drinking in 2015.  Medical History:  Past Medical History  Diagnosis Date  . Hyperlipidemia   . ETOH abuse   . Allergy   . Diverticulosis   . Hypertension   . Depression   . Asthma   . GERD (gastroesophageal reflux disease)   . Normal 24 hour ambulatory pH monitoring study     good acid suppression  . Dementia   . Shortness of breath   . Anxiety     panic attacks- on occas.   . Arthritis     osteoporosis - especially hips.   . Stroke ([place])   . Memory loss   . Confusion   . Hyponatremia 07/05/2015  .  Observed seizure-like activity ([place]) 07/05/2015    observed by husband , Dr Doy Mince  . Falls    Current medications:  Outpatient Encounter Prescriptions as of 04/04/2016  Medication Sig  . acetaminophen (TYLENOL) 500 MG tablet Take 1-2 tablets (500-1,000 mg total) by mouth every 6 (six) hours as needed for mild pain. (Patient taking differently: Take 650 mg by mouth every 4 (four) hours as needed for mild pain. )  . amLODipine (NORVASC) 2.5 MG tablet TAKE 1 TABLET BY MOUTH DAILY  . atorvastatin (LIPITOR) 10 MG tablet Take 10 mg by mouth daily at 6 PM.   . citalopram (CELEXA) 20 MG tablet Take 1 tablet (20 mg total) by mouth daily.  . clopidogrel (PLAVIX) 75 MG tablet TAKE 1 TABLET (75 MG TOTAL) BY MOUTH DAILY.  Marland Kitchen donepezil (ARICEPT) 10 MG tablet TAKE 1 TABLET BY MOUTH DAILY AT BEDTIME  . fentaNYL (DURAGESIC - DOSED MCG/HR) 25 MCG/HR patch Place 25 mcg onto the skin every 3 (three) days.  . fluticasone (FLONASE) 50  MCG/ACT nasal spray PLACE 1-2 SPRAYS INTO BOTH NOSTRILS DAILY.  Marland Kitchen gabapentin (NEURONTIN) 300 MG capsule Take 300 mg by mouth. Take 1 capsule at supper and 2 capsules at bedtime.  . hydrocortisone (ANUSOL-HC) 2.5 % rectal cream Apply rectally 2 times daily (Patient taking differently: Place 1 application rectally 2 (two) times daily as needed for hemorrhoids. )  . lansoprazole (PREVACID) 15 MG capsule Take 1 capsule (15 mg total) by mouth daily.  . meloxicam (MOBIC) 15 MG tablet Take 1 tablet (15 mg total) by mouth daily.  . montelukast (SINGULAIR) 10 MG tablet Take 10 mg by mouth at bedtime.   . Multiple Vitamins-Minerals (EYE VITAMINS) CAPS Take 1 capsule by mouth daily.  . Omega-3 Fatty Acids (FISH OIL) 1200 MG CAPS Take 1 capsule by mouth daily.  . ondansetron (ZOFRAN) 8 MG tablet TAKE 1 TABLET BY MOUTH EVERY 8 HOURS AS NEEDED FOR NAUSEA OR VOMITING  . oxyCODONE-acetaminophen (PERCOCET/ROXICET) 5-325 MG tablet Take 1 tablet by mouth 2 (two) times daily.  . polyvinyl alcohol (LIQUIFILM TEARS) 1.4 % ophthalmic solution Place 1 drop into both eyes daily as needed for dry eyes (allergies).  . promethazine (PHENERGAN) 25 MG tablet Take 1 tablet (25 mg total) by mouth 2 (two) times daily.  . SYMBICORT 160-4.5 MCG/ACT inhaler INHALE 2 PUFFS INTO THE LUNGS 2 (TWO) TIMES DAILY AS NEEDED (SHORTNESS OF BREATH).  Marland Kitchen vitamin E 400 UNIT capsule Take 400 Units by mouth daily.   No facility-administered encounter medications on file as of 04/04/2016.    Current Examination:  Behavioral Observations:  Appearance: Neatly and appropriately dressed and groomed Gait: Ambulated independently, mild unsteadiness observed Speech: Fluent; normal rate, rhythm and volume Thought process: Tangential, providing information spontaneously that does not pertain to what we are discussing Affect: Full, generally euthymic Interpersonal: Pleasant, polite Orientation: Oriented to person and aspects of place (city).  Disoriented to month ("April"), date, year ("2007"), day of the week ("Monday"). Disoriented to street/office.  Tests Administered: . Wechsler Adult Intelligence Scale-Fourth Edition (WAIS-IV):  Digit Span Forward and Backwards subtests . Repeatable Battery for the Assessment of Neuropsychological Status (RBANS) Form A:  Figure Copy and Recall subtests . Neuropsychological Assessment Battery (NAB) Language Module, Form 1: Naming . Trail Making Test A and B . Clock drawing test . Dementia Rating Scale-Second Edition (DRS-2) . Geriatric Depression Scale (GDS) 15 Item  Test Results: Note: Standardized scores are presented only for use by appropriately trained professionals and to  allow for any future test-retest comparison. These scores should not be interpreted without consideration of all the information that is contained in the rest of the report. The most recent standardization samples from the test publisher or other sources were used whenever possible to derive standard scores; scores were corrected for age, gender, ethnicity and education when available.   Test Scores:  Test Name Standardized Score Descriptor  DRS-2    Attention ss=13 High average  Initiation/Perseveration ss=2 Severely impaired  Construction ss=5 Borderline impaired  Conceptualization ss=2 Severely impaired  Memory ss=2 Severely Impaired  Total Score ss=2 Severely impaired  Total Score - Education Adjusted ss=1 Severely Impaired  WAIS-IV Subtests    Digit Span Forward ss=9 Average  Digit Span Backward ss=5 Borderline impaired  RBANS Subtests    Figure Copy Z= -5.44 Severely impaired  Figure Recall Z=-2.02 Impaired  NAB Naming T=19 Severely Impaired  Trail Making Test A 4 errors T=<20 Severely impaired  Trail Making Test B Discontinued Severely Impaired  Clock Drawing   Impaired  GDS-15 3/15 WNL    Description of Test Results:  Premorbid verbal intellectual abilities were estimated to have been within the  average range based on a test of word reading administered during her last evaluation in 2015. On the DRS-2, an omnibus measure of cognitive function and assessment tool for dementia, the patient's overall score fell within the severely impaired range, similar to her overall score in 2015. Basic attention abilities were high average, which actually represents a significant improvements relative to her 2015 performance on the same test. Visual-spatial construction was borderline impaired, representing a mild decline from 2015. Memory abilities were severely impaired (stable). Executive functioning, including initiation/perseveration and abstract reasoning, were severely impaired (stable). The patient was also able to tolerate a few other cognitive tests. Processing speed was significantly slowed on Trails B. Confrontation naming was severely impaired. She performed within the severely impaired range on her copy of a complex geometric figure (stable). Free recall of the figure after a delay was impaired. She was unable to carry out Trails B, reflecting severely impaired mental flexibility and set-shifting abilities. Performance on a clock drawing task was impaired due to inability to set the time to "ten past eleven". On a self-report questionnaire, the patient's responses were not indicative of clinically significant depression.  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  The patient continues to demonstrate severe and global cognitive impairment since her last evaluation in 2015. Her level of cognitive impairment and functional decline continue to be consistent with moderate-stage dementia. She has demonstrated some improvements in basic attention, which I suspect is secondary to reduced emotional distress. She has had some decline in visual-spatial construction since her last evaluation, which reflects expected progression of dementia.  Otherwise, cognitive testing results are relatively stable. Fortunately, the patient is reporting significantly reduced depression since her last evaluation. I continue to believe that her dementia is alcohol related (given her history of alcohol dependence, her cognitive profile, and her neuroimaging demonstrating extensive chronic microvascular ischemic change). A superimposed Alzheimer's disease cannot be ruled out.   Recommendations: Based on the findings of the present evaluation, the following recommendations are offered: 1. The patient should continue to receive assistance with all instrumental ADLs. I will again recommend that the patient's husband utilize caregiver services, respite care and/or palliative care. He may also benefit from attending a local dementia caregiver support group.  2. Continued abstinence from alcohol is of course recommended. Additionally,  given negative effects of tramadol in the past, the patient should not take this medication. 3. Continued use of behavioral strategies to assist in pain management is recommended (i.e., distraction).   Feedback to Patient: ANASTASIA CUCCIO and her husband returned for a feedback appointment on 04/04/2016 to review the results of her neuropsychological evaluation with this provider. 30 minutes face-to-face time was spent reviewing her test results, my impressions and my recommendations as detailed above. A list of local dementia support groups was provided to Mr. Vaccaro, and we spoke at length about the importance of getting assistance with care-giving.    Total time spent on this patient's case: [id number] unit; [phone] units of testing by psychometrician under psychologist's supervision; [phone] units including medical record review, administration and scoring of neuropsychological tests, interpretation of test results, preparation of this report, and review of results to the patient.     Thank you for your referral of COSMA POLLINO. Please feel free to contact me if you have any questions or concerns regarding this report.

## 2016-04-05 DIAGNOSIS — M7520 Bicipital tendinitis, unspecified shoulder: Secondary | ICD-10-CM | POA: Diagnosis not present

## 2016-04-07 ENCOUNTER — Ambulatory Visit (INDEPENDENT_AMBULATORY_CARE_PROVIDER_SITE_OTHER): Payer: Medicare Other | Admitting: *Deleted

## 2016-04-07 DIAGNOSIS — I639 Cerebral infarction, unspecified: Secondary | ICD-10-CM | POA: Diagnosis not present

## 2016-04-10 NOTE — Progress Notes (Signed)
[facility] Summary Report / Loop Recorder 

## 2016-04-11 DIAGNOSIS — J3089 Other allergic rhinitis: Secondary | ICD-10-CM | POA: Diagnosis not present

## 2016-04-11 DIAGNOSIS — J3081 Allergic rhinitis due to animal (cat) (dog) hair and dander: Secondary | ICD-10-CM | POA: Diagnosis not present

## 2016-04-13 LAB — CUP PACEART REMOTE DEVICE CHECK: MDC IDC SESS DTM: 20170525010623

## 2016-04-14 ENCOUNTER — Telehealth: Payer: Self-pay | Admitting: Cardiology

## 2016-04-14 NOTE — Telephone Encounter (Signed)
Spoke w/ pt husband and requested that pt send a manual transmission b/c her home monitor has not updated in at least 14 days.   

## 2016-04-24 DIAGNOSIS — Z79899 Other long term (current) drug therapy: Secondary | ICD-10-CM | POA: Diagnosis not present

## 2016-04-24 DIAGNOSIS — M47812 Spondylosis without myelopathy or radiculopathy, cervical region: Secondary | ICD-10-CM | POA: Diagnosis not present

## 2016-04-24 DIAGNOSIS — G894 Chronic pain syndrome: Secondary | ICD-10-CM | POA: Diagnosis not present

## 2016-04-24 DIAGNOSIS — Z79891 Long term (current) use of opiate analgesic: Secondary | ICD-10-CM | POA: Diagnosis not present

## 2016-04-24 DIAGNOSIS — M792 Neuralgia and neuritis, unspecified: Secondary | ICD-10-CM | POA: Diagnosis not present

## 2016-04-24 DIAGNOSIS — M19019 Primary osteoarthritis, unspecified shoulder: Secondary | ICD-10-CM | POA: Diagnosis not present

## 2016-04-24 DIAGNOSIS — G5621 Lesion of ulnar nerve, right upper limb: Secondary | ICD-10-CM | POA: Diagnosis not present

## 2016-04-25 DIAGNOSIS — J3089 Other allergic rhinitis: Secondary | ICD-10-CM | POA: Diagnosis not present

## 2016-04-25 DIAGNOSIS — G5621 Lesion of ulnar nerve, right upper limb: Secondary | ICD-10-CM | POA: Diagnosis not present

## 2016-04-25 DIAGNOSIS — J3081 Allergic rhinitis due to animal (cat) (dog) hair and dander: Secondary | ICD-10-CM | POA: Diagnosis not present

## 2016-04-28 LAB — CUP PACEART REMOTE DEVICE CHECK: Date Time Interrogation Session: 20170624013823

## 2016-05-02 ENCOUNTER — Encounter: Payer: Self-pay | Admitting: Gastroenterology

## 2016-05-02 DIAGNOSIS — J3089 Other allergic rhinitis: Secondary | ICD-10-CM | POA: Diagnosis not present

## 2016-05-02 DIAGNOSIS — J3081 Allergic rhinitis due to animal (cat) (dog) hair and dander: Secondary | ICD-10-CM | POA: Diagnosis not present

## 2016-05-03 ENCOUNTER — Ambulatory Visit: Payer: Medicare Other

## 2016-05-03 ENCOUNTER — Other Ambulatory Visit: Payer: Medicare Other

## 2016-05-03 ENCOUNTER — Ambulatory Visit: Payer: Medicare Other | Admitting: Oncology

## 2016-05-04 ENCOUNTER — Telehealth: Payer: Self-pay | Admitting: Gastroenterology

## 2016-05-04 DIAGNOSIS — IMO0001 Reserved for inherently not codable concepts without codable children: Secondary | ICD-10-CM

## 2016-05-04 DIAGNOSIS — R197 Diarrhea, unspecified: Secondary | ICD-10-CM

## 2016-05-04 NOTE — Telephone Encounter (Signed)
Pt has appt on 07/17/16 with Dr Ardis Hughs Left message on machine to call back

## 2016-05-05 ENCOUNTER — Other Ambulatory Visit: Payer: Self-pay | Admitting: *Deleted

## 2016-05-05 DIAGNOSIS — Z8719 Personal history of other diseases of the digestive system: Secondary | ICD-10-CM | POA: Diagnosis not present

## 2016-05-05 DIAGNOSIS — IMO0001 Reserved for inherently not codable concepts without codable children: Secondary | ICD-10-CM

## 2016-05-05 DIAGNOSIS — R197 Diarrhea, unspecified: Secondary | ICD-10-CM

## 2016-05-05 DIAGNOSIS — D509 Iron deficiency anemia, unspecified: Secondary | ICD-10-CM

## 2016-05-05 NOTE — Telephone Encounter (Signed)
Yes, c. Diff by PCR and by toxin.  thanks

## 2016-05-05 NOTE — Telephone Encounter (Signed)
Dr Ardis Hughs the pt's husband states the pt is having "lots of diarrhea stools daily"  He is concerned it may be recurrent C diff.  Would you like to get stool for c diff pcr?

## 2016-05-05 NOTE — Telephone Encounter (Signed)
The pt's husband has been notified and will bring the pt in today for stool studies

## 2016-05-06 LAB — CLOSTRIDIUM DIFFICILE BY PCR: CDIFFPCR: DETECTED — AB

## 2016-05-08 ENCOUNTER — Ambulatory Visit (INDEPENDENT_AMBULATORY_CARE_PROVIDER_SITE_OTHER): Payer: Medicare Other | Admitting: *Deleted

## 2016-05-08 DIAGNOSIS — I639 Cerebral infarction, unspecified: Secondary | ICD-10-CM | POA: Diagnosis not present

## 2016-05-08 NOTE — Progress Notes (Signed)
[facility] Summary Report / Loop Recorder 

## 2016-05-09 ENCOUNTER — Inpatient Hospital Stay: Payer: Medicare Other

## 2016-05-09 ENCOUNTER — Inpatient Hospital Stay: Payer: Medicare Other | Attending: Oncology | Admitting: Oncology

## 2016-05-09 VITALS — BP 106/68 | HR 73

## 2016-05-09 VITALS — BP 108/70 | HR 71 | Temp 98.1°F | Wt 153.0 lb

## 2016-05-09 DIAGNOSIS — D72829 Elevated white blood cell count, unspecified: Secondary | ICD-10-CM | POA: Diagnosis not present

## 2016-05-09 DIAGNOSIS — M549 Dorsalgia, unspecified: Secondary | ICD-10-CM | POA: Diagnosis not present

## 2016-05-09 DIAGNOSIS — R5383 Other fatigue: Secondary | ICD-10-CM

## 2016-05-09 DIAGNOSIS — R531 Weakness: Secondary | ICD-10-CM | POA: Diagnosis not present

## 2016-05-09 DIAGNOSIS — D509 Iron deficiency anemia, unspecified: Secondary | ICD-10-CM | POA: Insufficient documentation

## 2016-05-09 DIAGNOSIS — D473 Essential (hemorrhagic) thrombocythemia: Secondary | ICD-10-CM | POA: Diagnosis not present

## 2016-05-09 DIAGNOSIS — Z79899 Other long term (current) drug therapy: Secondary | ICD-10-CM | POA: Diagnosis not present

## 2016-05-09 DIAGNOSIS — K219 Gastro-esophageal reflux disease without esophagitis: Secondary | ICD-10-CM | POA: Insufficient documentation

## 2016-05-09 DIAGNOSIS — M2550 Pain in unspecified joint: Secondary | ICD-10-CM | POA: Insufficient documentation

## 2016-05-09 DIAGNOSIS — G8929 Other chronic pain: Secondary | ICD-10-CM | POA: Insufficient documentation

## 2016-05-09 DIAGNOSIS — M1990 Unspecified osteoarthritis, unspecified site: Secondary | ICD-10-CM | POA: Diagnosis not present

## 2016-05-09 DIAGNOSIS — I10 Essential (primary) hypertension: Secondary | ICD-10-CM | POA: Diagnosis not present

## 2016-05-09 DIAGNOSIS — F0390 Unspecified dementia without behavioral disturbance: Secondary | ICD-10-CM | POA: Diagnosis not present

## 2016-05-09 DIAGNOSIS — M542 Cervicalgia: Secondary | ICD-10-CM | POA: Insufficient documentation

## 2016-05-09 DIAGNOSIS — E785 Hyperlipidemia, unspecified: Secondary | ICD-10-CM | POA: Diagnosis not present

## 2016-05-09 DIAGNOSIS — D75839 Thrombocytosis, unspecified: Secondary | ICD-10-CM

## 2016-05-09 LAB — CBC WITH DIFFERENTIAL/PLATELET
Basophils Absolute: 0.1 10*3/uL (ref 0–0.1)
Basophils Relative: 1 %
Eosinophils Absolute: 0.6 10*3/uL (ref 0–0.7)
Eosinophils Relative: 5 %
HEMATOCRIT: 32.9 % — AB (ref 35.0–47.0)
HEMOGLOBIN: 10.6 g/dL — AB (ref 12.0–16.0)
LYMPHS ABS: 1.9 10*3/uL (ref 1.0–3.6)
LYMPHS PCT: 17 %
MCH: 22.3 pg — AB (ref 26.0–34.0)
MCHC: 32.1 g/dL (ref 32.0–36.0)
MCV: 69.4 fL — ABNORMAL LOW (ref 80.0–100.0)
MONOS PCT: 6 %
Monocytes Absolute: 0.7 10*3/uL (ref 0.2–0.9)
NEUTROS ABS: 8.3 10*3/uL — AB (ref 1.4–6.5)
NEUTROS PCT: 71 %
Platelets: 697 10*3/uL — ABNORMAL HIGH (ref 150–440)
RBC: 4.74 MIL/uL (ref 3.80–5.20)
RDW: 19.3 % — ABNORMAL HIGH (ref 11.5–14.5)
WBC: 11.6 10*3/uL — AB (ref 3.6–11.0)

## 2016-05-09 LAB — IRON AND TIBC
Iron: 11 ug/dL — ABNORMAL LOW (ref 28–170)
SATURATION RATIOS: 4 % — AB (ref 10.4–31.8)
TIBC: 318 ug/dL (ref 250–450)
UIBC: 307 ug/dL

## 2016-05-09 LAB — FERRITIN: Ferritin: 34 ng/mL (ref 11–307)

## 2016-05-09 MED ORDER — SODIUM CHLORIDE 0.9 % IV SOLN
Freq: Once | INTRAVENOUS | Status: AC
  Administered 2016-05-09: 16:00:00 via INTRAVENOUS
  Filled 2016-05-09: qty 1000

## 2016-05-09 MED ORDER — SODIUM CHLORIDE 0.9 % IV SOLN
510.0000 mg | Freq: Once | INTRAVENOUS | Status: AC
  Administered 2016-05-09: 510 mg via INTRAVENOUS
  Filled 2016-05-09: qty 17

## 2016-05-10 ENCOUNTER — Other Ambulatory Visit: Payer: Self-pay

## 2016-05-10 ENCOUNTER — Telehealth: Payer: Self-pay | Admitting: Gastroenterology

## 2016-05-10 MED ORDER — VANCOMYCIN 50 MG/ML ORAL SOLUTION: ORAL | 0 refills | Status: DC

## 2016-05-10 MED FILL — VANCOMYCIN 50MG/ML ORAL SOL: 50MG/1ML | 14 days supply | Qty: 200 | Fill #0

## 2016-05-10 NOTE — Telephone Encounter (Signed)
Prescription has been faxed to [facility] outpatient pharmacy. Pt notified, he will call back if there is a problem with the prescription.

## 2016-05-11 DIAGNOSIS — J3089 Other allergic rhinitis: Secondary | ICD-10-CM | POA: Diagnosis not present

## 2016-05-11 DIAGNOSIS — J3081 Allergic rhinitis due to animal (cat) (dog) hair and dander: Secondary | ICD-10-CM | POA: Diagnosis not present

## 2016-05-12 DIAGNOSIS — D72829 Elevated white blood cell count, unspecified: Secondary | ICD-10-CM | POA: Insufficient documentation

## 2016-05-12 DIAGNOSIS — D75839 Thrombocytosis, unspecified: Secondary | ICD-10-CM | POA: Insufficient documentation

## 2016-05-12 DIAGNOSIS — D473 Essential (hemorrhagic) thrombocythemia: Secondary | ICD-10-CM | POA: Insufficient documentation

## 2016-05-12 NOTE — Progress Notes (Signed)
[place]  [phone] [phone]  ID: GWENDOLYNNE DEBONIS OB: 01/29/1946  MR#: [id number]  [id number]  Patient Care Team: Tonia Ghent, MD as PCP - General (Family Medicine) Justice Britain, MD as Consulting Physician (Orthopedic Surgery)  CHIEF COMPLAINT: Leukocytosis, iron deficiency anemia, thrombocytosis.  INTERVAL HISTORY: Patient returns to clinic today for repeat laboratory work and further evaluation. She continues to have significant chronic pain which is being managed by her primary care physician. She continues to have persistent weakness and fatigue. She has no neurologic complaints. She denies any recent fevers. She has a good appetite and denies weight loss. She has no chest pain or shortness of breath. She denies any nausea, vomiting, constipation, or diarrhea. She has no melanotic or hematochezia. She has no urinary complaints. Patient offers no further specific complaints today.  REVIEW OF SYSTEMS:   Review of Systems  Constitutional: Negative for fever, malaise/fatigue and weight loss.  Respiratory: Negative.   Cardiovascular: Negative.  Negative for chest pain.  Gastrointestinal: Negative for blood in stool, melena and nausea.  Musculoskeletal: Positive for back pain, joint pain and neck pain.  Neurological: Negative.  Negative for weakness.  Psychiatric/Behavioral: Negative.     As per HPI. Otherwise, a complete review of systems is negatve.  PAST MEDICAL HISTORY: Past Medical History:  Diagnosis Date  . Allergy   . Anxiety    panic attacks- on occas.   . Arthritis    osteoporosis - especially hips.   . Asthma   . Confusion   . Dementia   . Depression   . Diverticulosis   . ETOH abuse   . Falls   . GERD (gastroesophageal reflux disease)   . Hyperlipidemia   . Hypertension   . Hyponatremia 07/05/2015  . Memory loss   . Normal 24 hour ambulatory pH monitoring study    good acid suppression  . Observed  seizure-like activity ([place]) 07/05/2015   observed by husband , Dr Doy Mince  . Shortness of breath   . Stroke [facility])     PAST SURGICAL HISTORY: Past Surgical History:  Procedure Laterality Date  . ABDOMINAL HYSTERECTOMY    . BREAST SURGERY     3x- biposies   . COLONOSCOPY WITH PROPOFOL N/A 08/05/2015   Procedure: COLONOSCOPY WITH PROPOFOL;  Surgeon: [name], MD;  Location: [facility];  Service: Endoscopy;  Laterality: N/A;  . DOBUTAMINE STRESS ECHO  07/09/2001   normal  . ESOPHAGOGASTRODUODENOSCOPY  06/11/2006   normal  . ESOPHAGOGASTRODUODENOSCOPY (EGD) WITH PROPOFOL N/A 08/05/2015   Procedure: ESOPHAGOGASTRODUODENOSCOPY (EGD) WITH PROPOFOL;  Surgeon: [name], MD;  Location: [facility];  Service: Endoscopy;  Laterality: N/A;  . EYE SURGERY     cataracts removed, IOL- both eyes   . JOINT REPLACEMENT     bilateral hips  . LOOP RECORDER IMPLANT N/A 10/14/2014   Procedure: LOOP RECORDER IMPLANT;  Surgeon: Evans Lance, MD;  Location[facility] C[facility] LAB;  Service: Cardiovascular;  Laterality: N/A;  . NASAL SINUS SURGERY  1990  . REVERSE SHOULDER ARTHROPLASTY Right 08/13/2014   Procedure: REVERSE SHOULDER ARTHROPLASTY;  Surgeon: Marin Shutter, MD;  Location: [place];  Service: Orthopedics;  Laterality: Right;  interscalene block  . SHOULDER ARTHROSCOPY Left   . TEE WITHOUT CARDIOVERSION N/A 10/14/2014   Procedure: TRANSESOPHAGEAL ECHOCARDIOGRAM (TEE);  Surgeon: Sueanne Margarita, MD;  Location: [place];  Service: Cardiovascular;  Laterality: N/A;  . TOTAL HIP ARTHROPLASTY     left 1991/right 2008  FAMILY HISTORY Family History  Problem Relation Age of Onset  . Sudden death Brother   . Heart attack Brother   . Hypertension Brother   . Esophageal cancer Brother   . Heart attack Father   . Heart block Mother   . Stroke Brother   . Stroke Mother        ADVANCED DIRECTIVES:    HEALTH MAINTENANCE: Social History  Substance Use Topics  . Smoking status: Never  Smoker  . Smokeless tobacco: Never Used  . Alcohol use No     Allergies  Allergen Reactions  . Tramadol Other (See Comments)    Would avoid.  History of seizure on medication  . Latex Rash  . Penicillins Rash    .Marland KitchenHas patient had a PCN reaction causing immediate rash, facial/tongue/throat swelling, SOB or lightheadedness with hypotension: No Has patient had a PCN reaction causing severe rash involving mucus membranes or skin necrosis: No Has patient had a PCN reaction that required hospitalization No Has patient had a PCN reaction occurring within the last 10 years: No If all of the above answers are "NO", then may proceed with Cephalosporin use.      Current Outpatient Prescriptions  Medication Sig Dispense Refill  . acetaminophen (TYLENOL) 650 MG CR tablet Take 650 mg by mouth.    Marland Kitchen amLODipine (NORVASC) 2.5 MG tablet TAKE 1 TABLET BY MOUTH DAILY    . atorvastatin (LIPITOR) 10 MG tablet Take 10 mg by mouth.    . budesonide-formoterol (SYMBICORT) 160-4.5 MCG/ACT inhaler INHALE 2 PUFFS INTO THE LUNGS 2 (TWO) TIMES DAILY AS NEEDED (SHORTNESS OF BREATH).    . citalopram (CELEXA) 20 MG tablet Take 1 tablet (20 mg total) by mouth daily. 90 tablet 1  . clopidogrel (PLAVIX) 75 MG tablet TAKE 1 TABLET (75 MG TOTAL) BY MOUTH DAILY.    Marland Kitchen donepezil (ARICEPT) 10 MG tablet TAKE 1 TABLET BY MOUTH DAILY AT BEDTIME    . doxepin (SINEQUAN) 25 MG capsule TAKE 1 CAPSULE BY MOUTH DAILY AT BEDTIME  5  . fentaNYL (DURAGESIC - DOSED MCG/HR) 25 MCG/HR patch Place 25 mcg onto the skin every 3 (three) days.    . fluticasone (FLONASE) 50 MCG/ACT nasal spray Place into the nose.    . gabapentin (NEURONTIN) 400 MG capsule     . hydrocortisone (ANUSOL-HC) 2.5 % rectal cream Apply rectally 2 times daily (Patient taking differently: Place 1 application rectally 2 (two) times daily as needed for hemorrhoids. ) 30 g 0  . lansoprazole (PREVACID) 15 MG capsule Take 1 capsule (15 mg total) by mouth daily. 90 capsule 1   . meloxicam (MOBIC) 15 MG tablet Take 15 mg by mouth.    . montelukast (SINGULAIR) 10 MG tablet Take 10 mg by mouth.    . Multiple Vitamins-Minerals (EYE VITAMINS) CAPS Take 1 capsule by mouth daily.    . naproxen sodium (ANAPROX) 220 MG tablet Take 220 mg by mouth.    . Omega-3 Fatty Acids (FISH OIL PO) Take by mouth.    Marland Kitchen omeprazole (PRILOSEC) 20 MG capsule TAKE 1 CAPSULE TWICE DAILY FOR 10 DAYS, THEN DECREASE TO NORMAL ONCE DAILY DOSING    . ondansetron (ZOFRAN) 8 MG tablet Take 8 mg by mouth.    . oxyCODONE-acetaminophen (PERCOCET) 7.5-325 MG tablet Take 1 tablet by mouth 2 (two) times daily.  0  . polyvinyl alcohol (LIQUIFILM TEARS) 1.4 % ophthalmic solution Place 1 drop into both eyes as needed.     . promethazine (PHENERGAN)  25 MG tablet Take 25 mg by mouth.    . SYMBICORT 160-4.5 MCG/ACT inhaler INHALE 2 PUFFS INTO THE LUNGS 2 (TWO) TIMES DAILY AS NEEDED (SHORTNESS OF BREATH). 10.2 Inhaler 7  . traMADol (ULTRAM) 50 MG tablet Take 100 mg by mouth.    . vitamin E 400 UNIT capsule Take by mouth.    . Vitamins/Minerals TABS Take by mouth.    . vancomycin (VANCOCIN) 50 mg/mL oral solution 125 mg( 2.5 ml ) 4 times daily for 2 weeks, then 3 times daily for 2 weeks, then 2 times daily for 1 week, then 1 time daily for 1 week, then then every other day for 1 week. 450 mL 0   No current facility-administered medications for this visit.     OBJECTIVE: Vitals:   05/09/16 1442  BP: 108/70  Pulse: 71  Temp: 98.1 F (36.7 C)     Body mass index is 23.96 kg/m.    ECOG FS:1 - Symptomatic but completely ambulatory  General: Well-developed, well-nourished, no acute distress. Eyes: Pink conjunctiva, anicteric sclera. Lungs: Clear to auscultation bilaterally. Heart: Regular rate and rhythm. No rubs, murmurs, or gallops. Abdomen: Soft, nontender, nondistended. No organomegaly noted, normoactive bowel sounds. Musculoskeletal: No edema, cyanosis, or clubbing. Neuro: Alert, answering all  questions appropriately. Cranial nerves grossly intact. Skin: No rashes or petechiae noted. Psych: Normal affect.   LAB RESULTS:  Lab Results  Component Value Date   NA 134 (L) 12/06/2015   K 4.2 12/06/2015   CL 97 12/06/2015   CO2 30 12/06/2015   GLUCOSE 95 12/06/2015   BUN 18 12/06/2015   CREATININE 1.04 12/06/2015   CALCIUM 10.2 12/06/2015   PROT 6.9 12/06/2015   ALBUMIN 4.1 12/06/2015   AST 26 12/06/2015   ALT 18 12/06/2015   ALKPHOS 64 12/06/2015   BILITOT 0.2 12/06/2015   GFRNONAA 53 (L) 07/08/2015   GFRAA >60 07/08/2015    Lab Results  Component Value Date   WBC 11.6 (H) 05/09/2016   NEUTROABS 8.3 (H) 05/09/2016   HGB 10.6 (L) 05/09/2016   HCT 32.9 (L) 05/09/2016   MCV 69.4 (L) 05/09/2016   PLT 697 (H) 05/09/2016   Lab Results  Component Value Date   IRON 11 (L) 05/09/2016   TIBC 318 05/09/2016   IRONPCTSAT 4 (L) 05/09/2016    Lab Results  Component Value Date   FERRITIN 34 05/09/2016     STUDIES: No results found.  ASSESSMENT: Leukocytosis, iron deficiency anemia, thrombocytosis.  PLAN:    1. Iron deficiency anemia: Patient's hemoglobinAnd iron stores have significantly decreased, therefore she will benefit from 510 mg IV iron today. Previously, the remainder of her laboratory work was also negative or within normal limits. Colonoscopy and EGD in October 2016 were unrevealing. Return to clinic in 1 week for a second infusion. Patient will then return to clinic in 3 months with repeat laboratory work and further evaluation.  2. Leukocytosis: Mildly elevated today. Peripheral blood flow cytometry as well as BCR-ABL mutation are negative.  3. Thrombocytosis: Platelets 702 today. JAK-2 mutation negative. Monitor. 4. Hypertension: Resolved. Patient's blood pressure is normal today. Continue current medications. 5 . Pain: Continue treatment per primary care and pain clinic.  Patient expressed understanding and was in agreement with this plan. She also  understands that She can call clinic at any time with any questions, concerns, or complaints.    Lloyd Huger, MD   05/12/2016 12:03 AM

## 2016-05-14 ENCOUNTER — Other Ambulatory Visit: Payer: Self-pay | Admitting: Family Medicine

## 2016-05-14 NOTE — Telephone Encounter (Signed)
Please call pharmacy to verify that patient has been getting this filled, along with citalopram and pain meds.   Let me know one way of the other so I can work on this.    Needs f/u with all meds brought to the OV.  Thanks.  Needs 30 min.

## 2016-05-15 NOTE — Telephone Encounter (Signed)
Sent. Needs f/u with all meds brought to the OV.  Thanks.  Needs 30 min.

## 2016-05-15 NOTE — Telephone Encounter (Signed)
Patient has been getting routine fills of Doxepin, Citalopram and Percocet as well as Fentanyl per [facility] on [address].  Please advise.

## 2016-05-16 ENCOUNTER — Inpatient Hospital Stay: Payer: Medicare Other | Attending: Oncology

## 2016-05-16 VITALS — BP 110/73 | HR 65 | Temp 96.3°F | Resp 18

## 2016-05-16 DIAGNOSIS — D72829 Elevated white blood cell count, unspecified: Secondary | ICD-10-CM | POA: Insufficient documentation

## 2016-05-16 DIAGNOSIS — D509 Iron deficiency anemia, unspecified: Secondary | ICD-10-CM | POA: Insufficient documentation

## 2016-05-16 DIAGNOSIS — J3089 Other allergic rhinitis: Secondary | ICD-10-CM | POA: Diagnosis not present

## 2016-05-16 DIAGNOSIS — D473 Essential (hemorrhagic) thrombocythemia: Secondary | ICD-10-CM | POA: Diagnosis not present

## 2016-05-16 DIAGNOSIS — J3081 Allergic rhinitis due to animal (cat) (dog) hair and dander: Secondary | ICD-10-CM | POA: Diagnosis not present

## 2016-05-16 MED ORDER — SODIUM CHLORIDE 0.9 % IV SOLN
Freq: Once | INTRAVENOUS | Status: AC
  Administered 2016-05-16: 15:00:00 via INTRAVENOUS
  Filled 2016-05-16: qty 1000

## 2016-05-16 MED ORDER — SODIUM CHLORIDE 0.9 % IV SOLN
510.0000 mg | Freq: Once | INTRAVENOUS | Status: AC
  Administered 2016-05-16: 510 mg via INTRAVENOUS
  Filled 2016-05-16: qty 17

## 2016-05-16 NOTE — Telephone Encounter (Signed)
Left detailed message on voicemail.  

## 2016-05-18 ENCOUNTER — Other Ambulatory Visit: Payer: Self-pay | Admitting: Family Medicine

## 2016-05-18 DIAGNOSIS — Z119 Encounter for screening for infectious and parasitic diseases, unspecified: Secondary | ICD-10-CM

## 2016-05-18 DIAGNOSIS — I10 Essential (primary) hypertension: Secondary | ICD-10-CM

## 2016-05-19 ENCOUNTER — Ambulatory Visit (INDEPENDENT_AMBULATORY_CARE_PROVIDER_SITE_OTHER): Payer: Medicare Other

## 2016-05-19 VITALS — BP 100/62 | HR 69 | Temp 98.4°F | Ht 64.5 in | Wt 153.0 lb

## 2016-05-19 DIAGNOSIS — I10 Essential (primary) hypertension: Secondary | ICD-10-CM | POA: Diagnosis not present

## 2016-05-19 DIAGNOSIS — Z119 Encounter for screening for infectious and parasitic diseases, unspecified: Secondary | ICD-10-CM | POA: Diagnosis not present

## 2016-05-19 DIAGNOSIS — Z0000 Encounter for general adult medical examination without abnormal findings: Secondary | ICD-10-CM | POA: Diagnosis not present

## 2016-05-19 LAB — LIPID PANEL
CHOL/HDL RATIO: 3
CHOLESTEROL: 139 mg/dL (ref 0–200)
HDL: 47.7 mg/dL (ref >39.00)
LDL CALC: 57 mg/dL (ref 0–99)
NonHDL: 91.46
TRIGLYCERIDES: 170 mg/dL — AB (ref 0.0–149.0)
VLDL: 34 mg/dL (ref 0.0–40.0)

## 2016-05-19 LAB — COMPREHENSIVE METABOLIC PANEL
ALBUMIN: 3.6 g/dL (ref 3.5–5.2)
ALT: 20 U/L (ref 0–35)
AST: 22 U/L (ref 0–37)
Alkaline Phosphatase: 71 U/L (ref 39–117)
BUN: 17 mg/dL (ref 6–23)
CALCIUM: 9.7 mg/dL (ref 8.4–10.5)
CO2: 27 meq/L (ref 19–32)
CREATININE: 0.91 mg/dL (ref 0.40–1.20)
Chloride: 97 mEq/L (ref 96–112)
GFR: 64.76 mL/min (ref >60.00)
Glucose, Bld: 91 mg/dL (ref 70–99)
Potassium: 4.3 mEq/L (ref 3.5–5.1)
Sodium: 129 mEq/L — ABNORMAL LOW (ref 135–145)
TOTAL PROTEIN: 6.4 g/dL (ref 6.0–8.3)
Total Bilirubin: 0.2 mg/dL (ref 0.2–1.2)

## 2016-05-19 NOTE — Progress Notes (Signed)
Subjective:   ARYAH ENDERBY is a 70 y.o. female who presents for Medicare Annual (Subsequent) preventive examination.  Review of Systems:  N/A Cardiac Risk Factors include: advanced age (>69men, >53 women);hypertension     Objective:     Vitals: BP 100/62 (BP Location: Left Arm, Patient Position: Sitting, Cuff Size: Normal)   Pulse 69   Temp 98.4 F (36.9 C) (Oral)   Ht 5' 4.5" (1.638 m) Comment: no shoes  Wt 153 lb (69.4 kg)   SpO2 95%   BMI 25.86 kg/m   Body mass index is 25.86 kg/m.   Tobacco History  Smoking Status  . Never Smoker  Smokeless Tobacco  . Never Used     Counseling given: No   Past Medical History:  Diagnosis Date  . Allergy   . Anxiety    panic attacks- on occas.   . Arthritis    osteoporosis - especially hips.   . Asthma   . Confusion   . Dementia   . Depression   . Diverticulosis   . ETOH abuse   . Falls   . GERD (gastroesophageal reflux disease)   . Hyperlipidemia   . Hypertension   . Hyponatremia 07/05/2015  . Memory loss   . Normal 24 hour ambulatory pH monitoring study    good acid suppression  . Observed seizure-like activity ([place]) 07/05/2015   observed by husband , Dr Doy Mince  . Shortness of breath   . Stroke [facility])    Past Surgical History:  Procedure Laterality Date  . ABDOMINAL HYSTERECTOMY    . BREAST SURGERY     3x- biposies   . COLONOSCOPY WITH PROPOFOL N/A 08/05/2015   Procedure: COLONOSCOPY WITH PROPOFOL;  Surgeon: [name], MD;  [facility];  Service: Endoscopy;  Laterality: N/A;  . DOBUTAMINE STRESS ECHO  07/09/2001   normal  . ESOPHAGOGASTRODUODENOSCOPY  06/11/2006   normal  . ESOPHAGOGASTRODUODENOSCOPY (EGD) WITH PROPOFOL N/A 08/05/2015   Procedure: ESOPHAGOGASTRODUODENOSCOPY (EGD) WITH PROPOFOL;  Surgeon: [name], MD;  [facility];  Service: Endoscopy;  Laterality: N/A;  . EYE SURGERY     cataracts removed, IOL- both eyes   . JOINT REPLACEMENT     bilateral  hips  . LOOP RECORDER IMPLANT N/A 10/14/2014   Procedure: LOOP RECORDER IMPLANT;  Surgeon: Evans Lance, MD;  Location: [facility] CATH LAB;  Service: Cardiovascular;  Laterality: N/A;  . NASAL SINUS SURGERY  1990  . REVERSE SHOULDER ARTHROPLASTY Right 08/13/2014   Procedure: REVERSE SHOULDER ARTHROPLASTY;  Surgeon: Marin Shutter, MD;  Location: [place];  Service: Orthopedics;  Laterality: Right;  interscalene block  . SHOULDER ARTHROSCOPY Left   . TEE WITHOUT CARDIOVERSION N/A 10/14/2014   Procedure: TRANSESOPHAGEAL ECHOCARDIOGRAM (TEE);  Surgeon: Sueanne Margarita, MD;  Location[facility] ENDOSCOPY;  Service: Cardiovascular;  Laterality: N/A;  . TOTAL HIP ARTHROPLASTY     left 1991/right 2008   Family History  Problem Relation Age of Onset  . Heart block Mother   . Stroke Mother   . Heart attack Father   . Sudden death Brother   . Heart attack Brother   . Hypertension Brother   . Esophageal cancer Brother   . Stroke Brother    History  Sexual Activity  . Sexual activity: Yes    Outpatient Encounter Prescriptions as of 05/19/2016  Medication Sig  . acetaminophen (TYLENOL) 650 MG CR tablet Take 650 mg by mouth.  Marland Kitchen amLODipine (NORVASC) 2.5 MG tablet TAKE 1  TABLET BY MOUTH DAILY  . atorvastatin (LIPITOR) 10 MG tablet Take 10 mg by mouth.  . budesonide-formoterol (SYMBICORT) 160-4.5 MCG/ACT inhaler INHALE 2 PUFFS INTO THE LUNGS 2 (TWO) TIMES DAILY AS NEEDED (SHORTNESS OF BREATH).  . citalopram (CELEXA) 20 MG tablet Take 1 tablet (20 mg total) by mouth daily.  . clopidogrel (PLAVIX) 75 MG tablet TAKE 1 TABLET (75 MG TOTAL) BY MOUTH DAILY.  Marland Kitchen donepezil (ARICEPT) 10 MG tablet TAKE 1 TABLET BY MOUTH DAILY AT BEDTIME  . doxepin (SINEQUAN) 25 MG capsule TAKE 1 CAPSULE BY MOUTH DAILY AT BEDTIME  . fentaNYL (DURAGESIC - DOSED MCG/HR) 25 MCG/HR patch Place 25 mcg onto the skin every 3 (three) days.  . fluticasone (FLONASE) 50 MCG/ACT nasal spray Place into the nose.  . gabapentin (NEURONTIN) 400 MG capsule    . hydrocortisone (ANUSOL-HC) 2.5 % rectal cream Apply rectally 2 times daily (Patient taking differently: Place 1 application rectally 2 (two) times daily as needed for hemorrhoids. )  . lansoprazole (PREVACID) 15 MG capsule Take 1 capsule (15 mg total) by mouth daily.  . montelukast (SINGULAIR) 10 MG tablet Take 10 mg by mouth.  . Multiple Vitamins-Minerals (EYE VITAMINS) CAPS Take 1 capsule by mouth daily.  . naproxen sodium (ANAPROX) 220 MG tablet Take 220 mg by mouth.  . Omega-3 Fatty Acids (FISH OIL PO) Take by mouth.  Marland Kitchen omeprazole (PRILOSEC) 20 MG capsule TAKE 1 CAPSULE TWICE DAILY FOR 10 DAYS, THEN DECREASE TO NORMAL ONCE DAILY DOSING  . ondansetron (ZOFRAN) 8 MG tablet Take 8 mg by mouth every 8 (eight) hours as needed.   Marland Kitchen oxyCODONE-acetaminophen (PERCOCET) 7.5-325 MG tablet Take 1 tablet by mouth 2 (two) times daily.  . polyvinyl alcohol (LIQUIFILM TEARS) 1.4 % ophthalmic solution Place 1 drop into both eyes as needed.   . promethazine (PHENERGAN) 25 MG tablet Take 25 mg by mouth.  . SYMBICORT 160-4.5 MCG/ACT inhaler INHALE 2 PUFFS INTO THE LUNGS 2 (TWO) TIMES DAILY AS NEEDED (SHORTNESS OF BREATH).  Marland Kitchen vancomycin (VANCOCIN) 50 mg/mL oral solution 125 mg( 2.5 ml ) 4 times daily for 2 weeks, then 3 times daily for 2 weeks, then 2 times daily for 1 week, then 1 time daily for 1 week, then then every other day for 1 week.  . vitamin E 400 UNIT capsule Take by mouth.  . Vitamins/Minerals TABS Take by mouth.  . [DISCONTINUED] doxepin (SINEQUAN) 25 MG capsule TAKE 1 CAPSULE BY MOUTH DAILY AT BEDTIME  . [DISCONTINUED] meloxicam (MOBIC) 15 MG tablet Take 15 mg by mouth.  . [DISCONTINUED] traMADol (ULTRAM) 50 MG tablet Take 100 mg by mouth.   No facility-administered encounter medications on file as of 05/19/2016.     Activities of Daily Living In your present state of health, do you have any difficulty performing the following activities: 05/19/2016  Hearing? Y  Vision? N  Difficulty  concentrating or making decisions? Y  Walking or climbing stairs? N  Dressing or bathing? Y  Doing errands, shopping? Y  Preparing Food and eating ? Y  Using the Toilet? N  In the past six months, have you accidently leaked urine? Y  Do you have problems with loss of bowel control? Y  Managing your Medications? Y  Managing your Finances? Y  Housekeeping or managing your Housekeeping? Y  Some recent data might be hidden    Patient Care Team: Tonia Ghent, MD as PCP - General (Family Medicine) Justice Britain, MD as Consulting Physician (Orthopedic Surgery)  Assessment:     Hearing Screening   125Hz  250Hz  500Hz  1000Hz  2000Hz  3000Hz  4000Hz  6000Hz  8000Hz   Right ear:   0 0 0  0    Left ear:   0 0 0  0    Vision Screening Comments: Last vision exam on Mar 15, 2016   Exercise Activities and Dietary recommendations Current Exercise Habits: Home exercise routine, Type of exercise: walking, Time (Minutes): 20, Frequency (Times/Week): 7, Weekly Exercise (Minutes/Week): 140, Intensity: Mild, Exercise limited by: None identified  Goals    . Increase physical activity          Starting 05/19/2016, I will continue to walk at least 20 min daily as weather permits.       Fall Risk Fall Risk  05/19/2016 09/27/2015 12/29/2013 07/26/2012  Falls in the past year? No Yes Yes -  Number falls in past yr: - 1 2 or more -  Injury with Fall? - Yes - -  Risk for fall due to : - - Impaired balance/gait Impaired balance/gait  Follow up - Falls evaluation completed - -   Depression Screen PHQ 2/9 Scores 05/19/2016 04/22/2015 12/29/2013 07/26/2012  PHQ - 2 Score 0 2 2 2      Cognitive Testing MMSE - Mini Mental State Exam 05/19/2016  Not completed: Unable to complete  Note: Dx of dementia  Immunization History  Administered Date(s) Administered  . Influenza Whole 07/25/2007, 07/14/2008, 08/19/2009, 07/13/2010  . Influenza, High Dose Seasonal PF 08/15/2013  . Influenza,inj,Quad PF,36+ Mos 06/27/2015    . Influenza-Unspecified 06/16/2014  . Pneumococcal Conjugate-13 12/29/2013  . Pneumococcal Polysaccharide-23 08/14/2014  . Td 10/16/2001  . Tetanus 12/29/2013  . Zoster 10/16/2013   Screening Tests Health Maintenance  Topic Date Due  . INFLUENZA VACCINE  10/15/2016 (Originally 05/16/2016)  . MAMMOGRAM  10/15/2016 (Originally 03/20/2010)  . DEXA SCAN  10/15/2016 (Originally 04/22/2010)  . TETANUS/TDAP  12/30/2023  . COLONOSCOPY  08/04/2025  . ZOSTAVAX  Completed  . Hepatitis C Screening  Completed  . PNA vac Low Risk Adult  Completed      Plan:     I have personally reviewed and addressed the Medicare Annual Wellness questionnaire and have noted the following in the patient's chart:  A. Medical and social history B. Use of alcohol, tobacco or illicit drugs  C. Current medications and supplements D. Functional ability and status E.  Nutritional status F.  Physical activity G. Advance directives H. List of other physicians I.  Hospitalizations, surgeries, and ER visits in previous 12 months J.  [place] to include hearing, vision, cognitive, depression L. Referrals and appointments - none  In addition, I have reviewed and discussed with patient certain preventive protocols, quality metrics, and best practice recommendations. A written personalized care plan for preventive services as well as general preventive health recommendations were provided to patient.  See attached scanned questionnaire for additional information.   Signed,   Lindell Noe, MHA, BS, LPN Health Advisor

## 2016-05-19 NOTE — Progress Notes (Signed)
Pre visit review using our clinic review tool, if applicable. No additional management support is needed unless otherwise documented below in the visit note. 

## 2016-05-19 NOTE — Progress Notes (Signed)
PCP notes:   Health maintenance:  Hep C screening - completed Mammogram - per spouse, will discuss with PCP Bone density - per spouse, will discuss with PCP  Abnormal screenings:   Hearing - failed  Patient concerns:   None  Nurse concerns:  None  Next PCP appt:   05/23/16 @ 1600  I reviewed health advisor's note, was available for consultation on the day of service listed in this note, and agree with documentation and plan. [name], MD.

## 2016-05-19 NOTE — Patient Instructions (Signed)
Aimee Jones , Thank you for taking time to come for your Medicare Wellness Visit. I appreciate your ongoing commitment to your health goals. Please review the following plan we discussed and let me know if I can assist you in the future.   These are the goals we discussed: Goals    Starting 05/19/2016, I will continue to walk at least 20 min daily as weather permits.       This is a list of the screening recommended for you and due dates:  Health Maintenance  Topic Date Due  . Flu Shot  10/15/2016*  . Mammogram  10/15/2016*  . DEXA scan (bone density measurement)  10/15/2016*  . Tetanus Vaccine  12/30/2023  . Colon Cancer Screening  08/04/2025  . Shingles Vaccine  Completed  .  Hepatitis C: One time screening is recommended by Center for Disease Control  (CDC) for  adults born from [birth year] through 1966.   Completed  . Pneumonia vaccines  Completed  *Topic was postponed. The date shown is not the original due date.   Preventive Care for Adults  A healthy lifestyle and preventive care can promote health and wellness. Preventive health guidelines for adults include the following key practices.  . A routine yearly physical is a good way to check with your health care provider about your health and preventive screening. It is a chance to share any concerns and updates on your health and to receive a thorough exam.  . Visit your dentist for a routine exam and preventive care every 6 months. Brush your teeth twice a day and floss once a day. Good oral hygiene prevents tooth decay and gum disease.  . The frequency of eye exams is based on your age, health, family medical history, use  of contact lenses, and other factors. Follow your health care provider's ecommendations for frequency of eye exams.  . Eat a healthy diet. Foods like vegetables, fruits, whole grains, low-fat dairy products, and lean protein foods contain the nutrients you need without too many calories. Decrease your intake of  foods high in solid fats, added sugars, and salt. Eat the right amount of calories for you. Get information about a proper diet from your health care provider, if necessary.  . Regular physical exercise is one of the most important things you can do for your health. Most adults should get at least 150 minutes of moderate-intensity exercise (any activity that increases your heart rate and causes you to sweat) each week. In addition, most adults need muscle-strengthening exercises on 2 or more days a week.  Silver Sneakers may be a benefit available to you. To determine eligibility, you may visit the website: www.silversneakers.com or contact program at 340 186 1513 Mon-Fri between 8AM-8PM.   . Maintain a healthy weight. The body mass index (BMI) is a screening tool to identify possible weight problems. It provides an estimate of body fat based on height and weight. Your health care provider can find your BMI and can help you achieve or maintain a healthy weight.   For adults 20 years and older: ? A BMI below 18.5 is considered underweight. ? A BMI of 18.5 to 24.9 is normal. ? A BMI of 25 to 29.9 is considered overweight. ? A BMI of 30 and above is considered obese.   . Maintain normal blood lipids and cholesterol levels by exercising and minimizing your intake of saturated fat. Eat a balanced diet with plenty of fruit and vegetables. Blood tests for lipids and cholesterol  should begin at age 46 and be repeated every 5 years. If your lipid or cholesterol levels are high, you are over 50, or you are at high risk for heart disease, you may need your cholesterol levels checked more frequently. Ongoing high lipid and cholesterol levels should be treated with medicines if diet and exercise are not working.  . If you smoke, find out from your health care provider how to quit. If you do not use tobacco, please do not start.  . If you choose to drink alcohol, please do not consume more than 2 drinks per  day. One drink is considered to be 12 ounces (355 mL) of beer, 5 ounces (148 mL) of wine, or 1.5 ounces (44 mL) of liquor.  . If you are 57-75 years old, ask your health care provider if you should take aspirin to prevent strokes.  . Use sunscreen. Apply sunscreen liberally and repeatedly throughout the day. You should seek shade when your shadow is shorter than you. Protect yourself by wearing long sleeves, pants, a wide-brimmed hat, and sunglasses year round, whenever you are outdoors.  . Once a month, do a whole body skin exam, using a mirror to look at the skin on your back. Tell your health care provider of new moles, moles that have irregular borders, moles that are larger than a pencil eraser, or moles that have changed in shape or color.

## 2016-05-20 LAB — HEPATITIS C ANTIBODY: HCV AB: NEGATIVE

## 2016-05-22 DIAGNOSIS — Z79899 Other long term (current) drug therapy: Secondary | ICD-10-CM | POA: Diagnosis not present

## 2016-05-22 DIAGNOSIS — Z79891 Long term (current) use of opiate analgesic: Secondary | ICD-10-CM | POA: Diagnosis not present

## 2016-05-22 DIAGNOSIS — M47812 Spondylosis without myelopathy or radiculopathy, cervical region: Secondary | ICD-10-CM | POA: Diagnosis not present

## 2016-05-22 DIAGNOSIS — M19019 Primary osteoarthritis, unspecified shoulder: Secondary | ICD-10-CM | POA: Diagnosis not present

## 2016-05-22 DIAGNOSIS — M792 Neuralgia and neuritis, unspecified: Secondary | ICD-10-CM | POA: Diagnosis not present

## 2016-05-22 DIAGNOSIS — G894 Chronic pain syndrome: Secondary | ICD-10-CM | POA: Diagnosis not present

## 2016-05-23 ENCOUNTER — Encounter: Payer: Self-pay | Admitting: Family Medicine

## 2016-05-23 ENCOUNTER — Ambulatory Visit (INDEPENDENT_AMBULATORY_CARE_PROVIDER_SITE_OTHER): Payer: Medicare Other | Admitting: Family Medicine

## 2016-05-23 VITALS — BP 114/66 | HR 72 | Temp 98.4°F | Wt 155.5 lb

## 2016-05-23 DIAGNOSIS — J3081 Allergic rhinitis due to animal (cat) (dog) hair and dander: Secondary | ICD-10-CM | POA: Diagnosis not present

## 2016-05-23 DIAGNOSIS — A047 Enterocolitis due to Clostridium difficile: Secondary | ICD-10-CM | POA: Diagnosis not present

## 2016-05-23 DIAGNOSIS — F329 Major depressive disorder, single episode, unspecified: Secondary | ICD-10-CM

## 2016-05-23 DIAGNOSIS — G894 Chronic pain syndrome: Secondary | ICD-10-CM

## 2016-05-23 DIAGNOSIS — J3089 Other allergic rhinitis: Secondary | ICD-10-CM | POA: Diagnosis not present

## 2016-05-23 DIAGNOSIS — F32A Depression, unspecified: Secondary | ICD-10-CM

## 2016-05-23 DIAGNOSIS — H9190 Unspecified hearing loss, unspecified ear: Secondary | ICD-10-CM | POA: Diagnosis not present

## 2016-05-23 DIAGNOSIS — A0472 Enterocolitis due to Clostridium difficile, not specified as recurrent: Secondary | ICD-10-CM

## 2016-05-23 DIAGNOSIS — I639 Cerebral infarction, unspecified: Secondary | ICD-10-CM

## 2016-05-23 NOTE — Patient Instructions (Signed)
Use debrox in the meantime.  Use warm water to irrigate your ear canals.  Aimee Jones will call about your referral. Take care.  Glad to see you.  Update me as needed.

## 2016-05-23 NOTE — Progress Notes (Signed)
Pre visit review using our clinic review tool, if applicable. No additional management support is needed unless otherwise documented below in the visit note. 

## 2016-05-23 NOTE — Progress Notes (Signed)
C diff treatment.  3rd episode of treamtent.  She is better in the meantime, with no diarrhea in the last few days.  In the midst of taper.  No loose stools for the last few days.  No fevers.    She is able to walk some in the meantime for exercise.   Had seen Dr. Georganna Skeans at [facility] and was advised she wouldn't like benefit from C spine surgery.   Mammogram- D/w pt. Defer for now.  All (husband/patient/I) agree.  DXA- D/w pt. Der for now.  All agree.   Hearing screen d/w pt.  We can refer.  It may help with her day to day function.  Pain clinic- followed by preferred pain clinic. Still with shoulder and neck pain.  On meds per pain clinic.  I am not rx'ing her pain meds.  She has some occ lower back pain.    She has f/u with ortho/hand clinic tomorrow.  She has been taping her R 4th and 5th fingers due to pain.     She has f/u with cards/onc/GI pending for fall 2017.    Mood is fair unless she is in pain.  "I'm grouchy when I'm hurting."  Still on SSRI, no ADE.  Walking helps her pain level and her mood.    PMH and SH reviewed  ROS: Per HPI unless specifically indicated in ROS section   Meds, vitals, and allergies reviewed.   GEN: nad, alert and pleasant in conversation.  HEENT: mucous membranes moist NECK: supple w/o LA CV: rrr.  PULM: ctab, no inc wob ABD: soft, +bs, not ttp EXT: no edema SKIN: no acute rash

## 2016-05-24 DIAGNOSIS — H9190 Unspecified hearing loss, unspecified ear: Secondary | ICD-10-CM | POA: Insufficient documentation

## 2016-05-24 DIAGNOSIS — G5621 Lesion of ulnar nerve, right upper limb: Secondary | ICD-10-CM | POA: Diagnosis not present

## 2016-05-24 DIAGNOSIS — A0472 Enterocolitis due to Clostridium difficile, not specified as recurrent: Secondary | ICD-10-CM | POA: Insufficient documentation

## 2016-05-24 NOTE — Assessment & Plan Note (Signed)
Mood is fair unless she is in pain.  "I'm grouchy when I'm hurting."  Still on SSRI, no ADE.  Walking helps her pain level and her mood.   No change in SSRI at this point. >25 minutes spent in face to face time with patient, >50% spent in counselling or coordination of care.

## 2016-05-24 NOTE — Assessment & Plan Note (Signed)
Per outside clinic.  I'll defer mgmt.

## 2016-05-24 NOTE — Assessment & Plan Note (Signed)
Refer

## 2016-05-24 NOTE — Assessment & Plan Note (Signed)
Improved in the meantime, on taper of vanc.  App help of all involved.

## 2016-05-26 ENCOUNTER — Telehealth: Payer: Self-pay

## 2016-05-26 NOTE — Telephone Encounter (Signed)
LEFT MESSAGE TO KASEY FROM ORTHO. [id number].

## 2016-05-26 NOTE — Progress Notes (Signed)
   Neuropsychology Note  Aimee Jones returned today for 2 hours of neuropsychological testing with technician, Milana Kidney, BS, under the supervision of Dr. Macarthur Critchley. The patient did not appear overtly distressed by the testing session, per behavioral observation or via self-report to the technician. Rest breaks were offered. Aimee Jones will return within 2 weeks for a feedback session with Dr. Si Raider at which time her test performances, clinical impressions and treatment recommendations will be reviewed in detail. The patient understands she can contact our office should she require our assistance before this time.  Full report to follow.

## 2016-05-31 MED FILL — VANCOMYCIN 50MG/ML ORAL SOL: 8 days supply | Qty: 120 | Fill #1

## 2016-06-01 DIAGNOSIS — J3081 Allergic rhinitis due to animal (cat) (dog) hair and dander: Secondary | ICD-10-CM | POA: Diagnosis not present

## 2016-06-01 DIAGNOSIS — J3089 Other allergic rhinitis: Secondary | ICD-10-CM | POA: Diagnosis not present

## 2016-06-01 NOTE — Progress Notes (Signed)
   Neuropsychology Note  Aimee Jones returned today for 2 hours of neuropsychological testing with technician, Milana Kidney, BS, under the supervision of Dr. Macarthur Critchley. The patient did not appear overtly distressed by the testing session, per behavioral observation or via self-report to the technician. Rest breaks were offered. Aimee Jones will return within 2 weeks for a feedback session with Dr. Si Raider at which time her test performances, clinical impressions and treatment recommendations will be reviewed in detail. The patient understands she can contact our office should she require our assistance before this time.  Full report to follow.

## 2016-06-03 LAB — CUP PACEART REMOTE DEVICE CHECK: Date Time Interrogation Session: 20170724020536

## 2016-06-03 NOTE — Progress Notes (Signed)
Carelink summary report received. Battery status OK. Normal device function. No new symptom episodes, brady, or pause episodes. No new AF episodes. 1 tachy episode- previously reviewed. Monthly summary reports and ROV/PRN

## 2016-06-06 ENCOUNTER — Ambulatory Visit (INDEPENDENT_AMBULATORY_CARE_PROVIDER_SITE_OTHER): Payer: Medicare Other | Admitting: *Deleted

## 2016-06-06 DIAGNOSIS — I639 Cerebral infarction, unspecified: Secondary | ICD-10-CM | POA: Diagnosis not present

## 2016-06-06 DIAGNOSIS — J3081 Allergic rhinitis due to animal (cat) (dog) hair and dander: Secondary | ICD-10-CM | POA: Diagnosis not present

## 2016-06-06 DIAGNOSIS — J3089 Other allergic rhinitis: Secondary | ICD-10-CM | POA: Diagnosis not present

## 2016-06-07 NOTE — Progress Notes (Signed)
[facility] Summary Report / Loop Recorder 

## 2016-06-13 DIAGNOSIS — H903 Sensorineural hearing loss, bilateral: Secondary | ICD-10-CM | POA: Diagnosis not present

## 2016-06-13 DIAGNOSIS — H6123 Impacted cerumen, bilateral: Secondary | ICD-10-CM | POA: Diagnosis not present

## 2016-06-15 DIAGNOSIS — J3081 Allergic rhinitis due to animal (cat) (dog) hair and dander: Secondary | ICD-10-CM | POA: Diagnosis not present

## 2016-06-15 DIAGNOSIS — J3089 Other allergic rhinitis: Secondary | ICD-10-CM | POA: Diagnosis not present

## 2016-06-15 MED FILL — VANCOMYCIN 50MG/ML ORAL SOL: 7 days supply | Qty: 60 | Fill #2

## 2016-06-20 DIAGNOSIS — Z79891 Long term (current) use of opiate analgesic: Secondary | ICD-10-CM | POA: Diagnosis not present

## 2016-06-20 DIAGNOSIS — M19019 Primary osteoarthritis, unspecified shoulder: Secondary | ICD-10-CM | POA: Diagnosis not present

## 2016-06-20 DIAGNOSIS — J3081 Allergic rhinitis due to animal (cat) (dog) hair and dander: Secondary | ICD-10-CM | POA: Diagnosis not present

## 2016-06-20 DIAGNOSIS — M47812 Spondylosis without myelopathy or radiculopathy, cervical region: Secondary | ICD-10-CM | POA: Diagnosis not present

## 2016-06-20 DIAGNOSIS — Z79899 Other long term (current) drug therapy: Secondary | ICD-10-CM | POA: Diagnosis not present

## 2016-06-20 DIAGNOSIS — M792 Neuralgia and neuritis, unspecified: Secondary | ICD-10-CM | POA: Diagnosis not present

## 2016-06-20 DIAGNOSIS — J3089 Other allergic rhinitis: Secondary | ICD-10-CM | POA: Diagnosis not present

## 2016-06-20 DIAGNOSIS — G894 Chronic pain syndrome: Secondary | ICD-10-CM | POA: Diagnosis not present

## 2016-06-21 ENCOUNTER — Encounter: Payer: Self-pay | Admitting: Family Medicine

## 2016-06-21 ENCOUNTER — Ambulatory Visit (INDEPENDENT_AMBULATORY_CARE_PROVIDER_SITE_OTHER): Payer: Medicare Other | Admitting: Family Medicine

## 2016-06-21 VITALS — BP 114/82 | HR 80 | Temp 97.7°F | Wt 155.8 lb

## 2016-06-21 DIAGNOSIS — I63433 Cerebral infarction due to embolism of bilateral posterior cerebral arteries: Secondary | ICD-10-CM

## 2016-06-21 DIAGNOSIS — A0472 Enterocolitis due to Clostridium difficile, not specified as recurrent: Secondary | ICD-10-CM

## 2016-06-21 DIAGNOSIS — A047 Enterocolitis due to Clostridium difficile: Secondary | ICD-10-CM

## 2016-06-21 DIAGNOSIS — F1027 Alcohol dependence with alcohol-induced persisting dementia: Secondary | ICD-10-CM

## 2016-06-21 DIAGNOSIS — L03032 Cellulitis of left toe: Secondary | ICD-10-CM

## 2016-06-21 DIAGNOSIS — F1097 Alcohol use, unspecified with alcohol-induced persisting dementia: Secondary | ICD-10-CM | POA: Diagnosis not present

## 2016-06-21 MED ORDER — CEPHALEXIN 500 MG PO CAPS: 1000.0000 mg | ORAL_CAPSULE | Freq: Two times a day (BID) | ORAL | 0 refills | Status: DC

## 2016-06-21 NOTE — Progress Notes (Signed)
Pre visit review using our clinic review tool, if applicable. No additional management support is needed unless otherwise documented below in the visit note. 

## 2016-06-21 NOTE — Progress Notes (Signed)
Dr. Frederico Hamman T. Reyaan Thoma, MD, [facility] [place], [address] Phone: [phone] Fax: [phone]  06/21/2016  Patient: Aimee Jones, MRN: [id number], DOB: 1946-08-05, 70 y.o.  Primary Physician:  [name], MD   Chief Complaint  Patient presents with  . Ingrown Toenail    Left big toe   Subjective:   Aimee Jones is a 71 y.o. very pleasant female patient who presents with the following:  Left great toe - ingrown and infected This patient has a very complex medical history with a history of stroke, parkinsonism, dementia, by history alcohol abuse, Plavix use, chronic kidney disease stage III, chronic pain, among other issues including ongoing C. Difficile colitis.  Over the last few days she has developed some pain on the medial aspect of her left great toe.  This is reddened without any evidence of pus or abscess formation.  Past Medical History, Surgical History, Social History, Family History, Problem List, Medications, and Allergies have been reviewed and updated if relevant.  Patient Active Problem List   Diagnosis Date Noted  . Hearing loss 05/24/2016  . Enteritis due to Clostridium difficile 05/24/2016  . Leukocytosis 05/12/2016  . Thrombocytosis ([place]) 05/12/2016  . Cervical stenosis of spine 01/10/2016  . Loss of weight 12/06/2015  . Dysuria 10/14/2015  . Cerebrovascular accident (CVA) due to bilateral embolism of posterior cerebral arteries 09/27/2015  . Chronic pain syndrome 09/27/2015  . Elevated blood pressure 07/07/2015  . Seizure-like activity ([place]) 07/04/2015  . Seizure ([place]) 07/04/2015  . Depression   . GI bleed 06/24/2015  . Frequent falls   . Narcotic abuse   . Benzodiazepine abuse   . Alcohol abuse   . Alcohol use with alcohol-induced persisting dementia ([place])   . Generalized anxiety disorder   . Hypokalemia   . Hypomagnesemia   . Anemia, iron deficiency   . Urinary  retention   . Altered mental status 06/22/2015  . Fall 06/22/2015  . Iron deficiency anemia 06/03/2015  . Medicare annual wellness visit, subsequent 04/23/2015  . Advance care planning 04/23/2015  . Senile purpura ([place]) 04/23/2015  . Abnormal CBC 04/23/2015  . Sleep apnea 12/11/2014  . 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 Stroke ([place])   . Cerebral thrombosis with cerebral infarction ([place]) 10/13/2014  . HLD (hyperlipidemia)   . Facial droop 10/12/2014  . CKD (chronic kidney disease), stage III 10/12/2014  . S/P shoulder replacement 08/13/2014  . Postmenopausal HRT (hormone replacement therapy) 05/31/2014  . Hyponatremia 05/28/2014  . Shoulder pain, bilateral 10/25/2012  . Parkinsonism ([place]) 08/22/2012  . MEMORY LOSS 06/22/2010  . ALCOHOL ABUSE 03/08/2010  . 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 OBSTRUCTIVE CHRONIC BRONCHITIS WITH EXACERBATION 01/02/2008  . GLUCOSE INTOLERANCE 11/15/2007  . GERD 07/25/2007  . OSTEOARTHRITIS 07/25/2007  . PANIC ATTACK 05/01/2007  . Essential hypertension 05/01/2007  . ALLERGIC RHINITIS 05/01/2007  . ASTHMA 05/01/2007  . DIVERTICULOSIS, COLON 05/01/2007  . MENOPAUSAL DISORDER 05/01/2007  . FIBROMYALGIA 05/01/2007    Past Medical History:  Diagnosis Date  . Allergy   . Anxiety    panic attacks- on occas.   . Arthritis    osteoporosis - especially hips.   . Asthma   . Confusion   . Dementia   . Depression   . Diverticulosis   . ETOH abuse   . Falls   . GERD (gastroesophageal reflux disease)   . Hyperlipidemia   . Hypertension   .  Hyponatremia 07/05/2015  . Memory loss   . Normal 24 hour ambulatory pH monitoring study    good acid suppression  . Observed seizure-like activity ([place]) 07/05/2015   observed by husband , Dr Doy Mince  . Shortness of breath   . Stroke [facility])     Past Surgical History:  Procedure Laterality Date  . ABDOMINAL HYSTERECTOMY    . BREAST SURGERY     3x- biposies   . COLONOSCOPY WITH  PROPOFOL N/A 08/05/2015   Procedure: COLONOSCOPY WITH PROPOFOL;  Surgeon: [name], MD;  Location: [facility] ENDOSCOPY;  Service: Endoscopy;  Laterality: N/A;  . DOBUTAMINE STRESS ECHO  07/09/2001   normal  . ESOPHAGOGASTRODUODENOSCOPY  06/11/2006   normal  . ESOPHAGOGASTRODUODENOSCOPY (EGD) WITH PROPOFOL N/A 08/05/2015   Procedure: ESOPHAGOGASTRODUODENOSCOPY (EGD) WITH PROPOFOL;  Surgeon: [name], MD;  Location: [facility] ENDOSCOPY;  Service: Endoscopy;  Laterality: N/A;  . EYE SURGERY     cataracts removed, IOL- both eyes   . JOINT REPLACEMENT     bilateral hips  . LOOP RECORDER IMPLANT N/A 10/14/2014   Procedure: LOOP RECORDER IMPLANT;  Surgeon: Evans Lance, MD;  Location: [facility] CATH LAB;  Service: Cardiovascular;  Laterality: N/A;  . NASAL SINUS SURGERY  1990  . REVERSE SHOULDER ARTHROPLASTY Right 08/13/2014   Procedure: REVERSE SHOULDER ARTHROPLASTY;  Surgeon: Marin Shutter, MD;  Location: [facility];  Service: Orthopedics;  Laterality: Right;  interscalene block  . SHOULDER ARTHROSCOPY Left   . TEE WITHOUT CARDIOVERSION N/A 10/14/2014   Procedure: TRANSESOPHAGEAL ECHOCARDIOGRAM (TEE);  Surgeon: Sueanne Margarita, MD;  Location: [facility];  Service: Cardiovascular;  Laterality: N/A;  . TOTAL HIP ARTHROPLASTY     left 1991/right 2008    Social History   Social History  . Marital status: Married    Spouse name: N/A  . Number of children: N/A  . Years of education: N/A   Occupational History  . retired Retired    [employer]   Social History Main Topics  . Smoking status: Never Smoker  . Smokeless tobacco: Never Used  . Alcohol use No  . Drug use: No  . Sexual activity: Yes   Other Topics Concern  . Not on file   Social History Narrative   Married. Retired.      Family History  Problem Relation Age of Onset  . Heart block Mother   . Stroke Mother   . Heart attack Father   . Sudden death Brother   . Heart attack Brother   . Hypertension Brother   . Esophageal  cancer Brother   . Stroke Brother     Allergies  Allergen Reactions  . Tramadol Other (See Comments)    Would avoid.  History of seizure on medication  . Latex Rash  . Penicillins Rash    .Marland KitchenHas patient had a PCN reaction causing immediate rash, facial/tongue/throat swelling, SOB or lightheadedness with hypotension: No Has patient had a PCN reaction causing severe rash involving mucus membranes or skin necrosis: No Has patient had a PCN reaction that required hospitalization No Has patient had a PCN reaction occurring within the last 10 years: No If all of the above answers are "NO", then may proceed with Cephalosporin use.      Medication list reviewed and updated in full in [place].  ROS: GEN: Acute illness details above GI: Tolerating PO intake GU: maintaining adequate hydration and urination Pulm: No SOB Interactive and getting along well at home.  Otherwise,  ROS is as per the HPI.  Objective:   BP 114/82   Pulse 80   Temp 97.7 F (36.5 C) (Oral)   Wt 155 lb 12 oz (70.6 kg)   BMI 26.32 kg/m    GEN: WDWN, NAD, Non-toxic, Alert & Oriented x 3 HEENT: Atraumatic, Normocephalic.  Ears and Nose: No external deformity. EXTR: No clubbing/cyanosis/edema NEURO: Normal gait.  PSYCH: Normally interactive. Conversant. Not depressed or anxious appearing.  Calm demeanor.    Medial aspect of the left great toe is mild to moderately tender to palpation and somewhat reddened in appearance.  There does not appear to be any pus trapped beneath the toenail.   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  Complicated medical patient.  Suspect that this will improve  with antibiotics and basic care.  Instructed on how to do basic traction as well as using some dental floss  To provide some slight separation between the nail and the  Skin.  They understand if she worsens significantly or is not improved by Monday, then she likely will need follow-up.  My recommendation would be podiatry consultation given the Plavix and  Complexity from a medical standpoint.  Follow-up: No Follow-up on file.  New Prescriptions   CEPHALEXIN (KEFLEX) 500 MG CAPSULE    Take 2 capsules (1,000 mg total) by mouth 2 (two) times daily.   Signed,  Maud Deed. Keeley Sussman, MD   Patient's Medications  New Prescriptions   CEPHALEXIN (KEFLEX) 500 MG CAPSULE    Take 2 capsules (1,000 mg total) by mouth 2 (two) times daily.  Previous Medications   ACETAMINOPHEN (TYLENOL) 650 MG CR TABLET    Take 650 mg by mouth.   AMLODIPINE (NORVASC) 2.5 MG TABLET    TAKE 1 TABLET BY MOUTH DAILY   ATORVASTATIN (LIPITOR) 10 MG TABLET    Take 10 mg by mouth.   BUDESONIDE-FORMOTEROL (SYMBICORT) 160-4.5 MCG/ACT INHALER    INHALE 2 PUFFS INTO THE LUNGS 2 (TWO) TIMES DAILY AS NEEDED (SHORTNESS OF BREATH).   CITALOPRAM (CELEXA) 20 MG TABLET    Take 1 tablet (20 mg total) by mouth daily.   CLOPIDOGREL (PLAVIX) 75 MG TABLET    TAKE 1 TABLET (75 MG TOTAL) BY MOUTH DAILY.   DONEPEZIL (ARICEPT) 10 MG TABLET    TAKE 1 TABLET BY MOUTH DAILY AT BEDTIME   DOXEPIN (SINEQUAN) 25 MG CAPSULE    TAKE 1 CAPSULE BY MOUTH DAILY AT BEDTIME   FENTANYL (DURAGESIC - DOSED MCG/HR) 25 MCG/HR PATCH    Place 25 mcg onto the skin every 3 (three) days.   FLUTICASONE (FLONASE) 50 MCG/ACT NASAL SPRAY    Place into the nose.   GABAPENTIN (NEURONTIN) 400 MG CAPSULE       HYDROCORTISONE (ANUSOL-HC) 2.5 % RECTAL CREAM    Apply rectally 2 times daily   LANSOPRAZOLE (PREVACID) 15 MG CAPSULE    Take 1 capsule (15 mg total) by mouth daily.   MONTELUKAST (SINGULAIR) 10 MG TABLET    Take 10 mg by mouth.   MULTIPLE VITAMINS-MINERALS (EYE  VITAMINS) CAPS    Take 1 capsule by mouth daily.   NAPROXEN SODIUM (ANAPROX) 220 MG TABLET    Take 220 mg by mouth.   OMEGA-3 FATTY  ACIDS (FISH OIL PO)    Take by mouth.   ONDANSETRON (ZOFRAN) 8 MG TABLET    Take 8 mg by mouth every 8 (eight) hours as needed.    OXYCODONE-ACETAMINOPHEN (PERCOCET) 7.5-325 MG TABLET    Take 1 tablet by mouth 2 (two) times daily.   POLYVINYL ALCOHOL (LIQUIFILM TEARS) 1.4 % OPHTHALMIC SOLUTION    Place 1 drop into both eyes as needed.    PROMETHAZINE (PHENERGAN) 25 MG TABLET    Take 25 mg by mouth.   SYMBICORT 160-4.5 MCG/ACT INHALER    INHALE 2 PUFFS INTO THE LUNGS 2 (TWO) TIMES DAILY AS NEEDED (SHORTNESS OF BREATH).   VANCOMYCIN (VANCOCIN) 50 MG/ML ORAL SOLUTION    125 mg( 2.5 ml ) 4 times daily for 2 weeks, then 3 times daily for 2 weeks, then 2 times daily for 1 week, then 1 time daily for 1 week, then then every other day for 1 week.   VITAMIN E 400 UNIT CAPSULE    Take by mouth.   VITAMINS/MINERALS TABS    Take by mouth.  Modified Medications   No medications on file  Discontinued Medications   No medications on file

## 2016-06-27 DIAGNOSIS — J3081 Allergic rhinitis due to animal (cat) (dog) hair and dander: Secondary | ICD-10-CM | POA: Diagnosis not present

## 2016-06-27 DIAGNOSIS — J3089 Other allergic rhinitis: Secondary | ICD-10-CM | POA: Diagnosis not present

## 2016-06-28 MED FILL — VANCOMYCIN 50MG/ML ORAL SOL: 14 days supply | Qty: 30 | Fill #3

## 2016-07-04 DIAGNOSIS — J3081 Allergic rhinitis due to animal (cat) (dog) hair and dander: Secondary | ICD-10-CM | POA: Diagnosis not present

## 2016-07-04 DIAGNOSIS — J3089 Other allergic rhinitis: Secondary | ICD-10-CM | POA: Diagnosis not present

## 2016-07-05 ENCOUNTER — Other Ambulatory Visit: Payer: Self-pay | Admitting: Family Medicine

## 2016-07-05 ENCOUNTER — Telehealth: Payer: Self-pay | Admitting: Family Medicine

## 2016-07-05 DIAGNOSIS — L03032 Cellulitis of left toe: Secondary | ICD-10-CM

## 2016-07-05 LAB — CUP PACEART REMOTE DEVICE CHECK: MDC IDC SESS DTM: 20170823020652

## 2016-07-05 NOTE — Progress Notes (Signed)
Carelink summary report received. Battery status OK. Normal device function. No new symptom episodes, tachy episodes, brady, or pause episodes. No new AF episodes. Monthly summary reports and ROV/PRN 

## 2016-07-05 NOTE — Telephone Encounter (Signed)
Received refill request electronically Last refill 04/16/16 #5 refills Last office visit 06/21/16/acute

## 2016-07-05 NOTE — Telephone Encounter (Signed)
done

## 2016-07-05 NOTE — Telephone Encounter (Signed)
Spouse called stating pt has taken all her meds and her ingrown toenail is not any better and would like an appointment with podiatrist today if possible [facility] or [facility] [place] is fine  They want first available.  Need referral in system

## 2016-07-06 ENCOUNTER — Ambulatory Visit (INDEPENDENT_AMBULATORY_CARE_PROVIDER_SITE_OTHER): Payer: Medicare Other | Admitting: *Deleted

## 2016-07-06 ENCOUNTER — Encounter: Payer: Self-pay | Admitting: Podiatry

## 2016-07-06 ENCOUNTER — Ambulatory Visit (INDEPENDENT_AMBULATORY_CARE_PROVIDER_SITE_OTHER): Payer: Medicare Other | Admitting: Podiatry

## 2016-07-06 VITALS — BP 137/79 | HR 75 | Resp 16

## 2016-07-06 DIAGNOSIS — L03039 Cellulitis of unspecified toe: Secondary | ICD-10-CM

## 2016-07-06 DIAGNOSIS — I639 Cerebral infarction, unspecified: Secondary | ICD-10-CM

## 2016-07-06 DIAGNOSIS — I63433 Cerebral infarction due to embolism of bilateral posterior cerebral arteries: Secondary | ICD-10-CM | POA: Diagnosis not present

## 2016-07-06 DIAGNOSIS — L600 Ingrowing nail: Secondary | ICD-10-CM

## 2016-07-06 DIAGNOSIS — M79676 Pain in unspecified toe(s): Secondary | ICD-10-CM | POA: Diagnosis not present

## 2016-07-06 NOTE — Progress Notes (Signed)
   Subjective:    Patient ID: Aimee Jones, female    DOB: April 16, 1946, 70 y.o.   MRN: [id number]  HPI    Review of Systems  All other systems reviewed and are negative.      Objective:   Physical Exam        Assessment & Plan:

## 2016-07-06 NOTE — Telephone Encounter (Signed)
Please verify that she has been on this med without adverse event.  I thought she was off this med.  I didn't send yet.  Thanks.

## 2016-07-06 NOTE — Telephone Encounter (Signed)
Sent. Thanks.   

## 2016-07-06 NOTE — Telephone Encounter (Signed)
Husband says she is on this medication and it is really helping her with her shoulder pain, etc.  Husband requests a 90 day Rx instead of the 30 day requested.  Please advise.

## 2016-07-07 NOTE — Progress Notes (Signed)
[facility] Summary Report / Loop Recorder 

## 2016-07-09 NOTE — Patient Instructions (Signed)
Ingrown Toenail  An ingrown toenail occurs when the corner or sides of your toenail grow into the surrounding skin. The big toe is most commonly affected, but it can happen to any of your toes. If your ingrown toenail is not treated, you will be at risk for infection.  CAUSES  This condition may be caused by:  · Wearing shoes that are too small or tight.  · Injury or trauma, such as stubbing your toe or having your toe stepped on.  · Improper cutting or care of your toenails.  · Being born with (congenital) nail or foot abnormalities, such as having a nail that is too big for your toe.  RISK FACTORS  Risk factors for an ingrown toenail include:  · Age. Your nails tend to thicken as you get older, so ingrown nails are more common in older people.  · Diabetes.  · Cutting your toenails incorrectly.  · Blood circulation problems.  SYMPTOMS  Symptoms may include:  · Pain, soreness, or tenderness.  · Redness.  · Swelling.  · Hardening of the skin surrounding the toe.  Your ingrown toenail may be infected if there is fluid, pus, or drainage.  DIAGNOSIS   An ingrown toenail may be diagnosed by medical history and physical exam. If your toenail is infected, your health care provider may test a sample of the drainage.  TREATMENT  Treatment depends on the severity of your ingrown toenail. Some ingrown toenails may be treated at home. More severe or infected ingrown toenails may require surgery to remove all or part of the nail. Infected ingrown toenails may also be treated with antibiotic medicines.  HOME CARE INSTRUCTIONS  · If you were prescribed an antibiotic medicine, finish all of it even if you start to feel better.  · Soak your foot in warm soapy water for 20 minutes, 3 times per day or as directed by your health care provider.  · Carefully lift the edge of the nail away from the sore skin by wedging a small piece of cotton under the corner of the nail. This may help with the pain.  Be careful not to cause more injury  to the area.  · Wear shoes that fit well. If your ingrown toenail is causing you pain, try wearing sandals, if possible.  · Trim your toenails regularly and carefully. Do not cut them in a curved shape. Cut your toenails straight across. This prevents injury to the skin at the corners of the toenail.  · Keep your feet clean and dry.  · If you are having trouble walking and are given crutches by your health care provider, use them as directed.  · Do not pick at your toenail or try to remove it yourself.  · Take medicines only as directed by your health care provider.  · Keep all follow-up visits as directed by your health care provider. This is important.  SEEK MEDICAL CARE IF:  · Your symptoms do not improve with treatment.  SEEK IMMEDIATE MEDICAL CARE IF:  · You have red streaks that start at your foot and go up your leg.  · You have a fever.  · You have increased redness, swelling, or pain.  · You have fluid, blood, or pus coming from your toenail.     This information is not intended to replace advice given to you by your health care provider. Make sure you discuss any questions you have with your health care provider.     Document Released:   09/29/2000 Document Revised: 02/16/2015 Document Reviewed: 08/26/2014  Elsevier Interactive Patient Education ©2016 [employer]

## 2016-07-09 NOTE — Progress Notes (Signed)
Patient ID: Aimee Jones, female   DOB: 1946/04/15, 70 y.o.   MRN: [id number] Subjective: Patient presents for pain and tenderness to the left hallux medial border. Patient states that she's had an ingrown nail her for about 6 weeks. Patient presents for further treatment and evaluation  Objective:  General: Well developed, nourished, in no acute distress, alert and oriented x3   Dermatology: Skin is warm, dry and supple bilateral. Medial border of the left hallux nail appears to be  severely incurvated with hyperkeratosis formation at the distal aspects of  the medial and lateral nail border. The remaining nails appear unremarkable at this time. There are no open sores, lesions.  Vascular: Dorsalis Pedis artery and Posterior Tibial artery pedal pulses palpable. No lower extremity edema noted.   Neruologic: Grossly intact via light touch bilateral.  Musculoskeletal: Tenderness to palpation of the medial border of the left hallux nail fold(s). Muscular strength within normal limits in all groups bilateral.   Assesement: #1 paronychia left hallux medial border #2 pain in left great toe #3 ingrowing nail left hallux medial border   Plan of Care:  1. Patient evaluated.  2. Discussed treatment alternatives and plan of care; Explained nail avulsion procedure and post procedure course to patient. 3. Patient opted for partial nail avulsion.  4. Prior to procedure, local anesthesia infiltration utilized using 3 ml of a 50:50 mixture of 2% plain lidocaine and 0.5% plain marcaine in a normal hallux block fashion and a betadine prep performed.  5. Partial permanent nail avulsion performed including the respective nail matrix using [id number] applications of phenol followed by alcohol flush on the.  6. Light dressing applied. 7. Return to clinic in 2 weeks.   Edrick Kins, DPM

## 2016-07-10 ENCOUNTER — Other Ambulatory Visit: Payer: Self-pay | Admitting: Family Medicine

## 2016-07-11 ENCOUNTER — Ambulatory Visit (INDEPENDENT_AMBULATORY_CARE_PROVIDER_SITE_OTHER): Payer: Medicare Other

## 2016-07-11 DIAGNOSIS — Z23 Encounter for immunization: Secondary | ICD-10-CM

## 2016-07-11 DIAGNOSIS — J3089 Other allergic rhinitis: Secondary | ICD-10-CM | POA: Diagnosis not present

## 2016-07-11 DIAGNOSIS — J3081 Allergic rhinitis due to animal (cat) (dog) hair and dander: Secondary | ICD-10-CM | POA: Diagnosis not present

## 2016-07-11 NOTE — Telephone Encounter (Signed)
Left message for patient to call back  

## 2016-07-12 ENCOUNTER — Telehealth: Payer: Self-pay | Admitting: Gastroenterology

## 2016-07-12 DIAGNOSIS — R197 Diarrhea, unspecified: Secondary | ICD-10-CM

## 2016-07-13 NOTE — Telephone Encounter (Signed)
Dr Ardis Hughs the pt's husband walked in to the lab yesterday and requested a C diff kit.  The lab provided that kit but there is no order for it to be completed.  I called the husband and he states he wanted to have it done because the pt is scheduled for a procedure on her elbow and they will not do it if she has C diff.  Can I put an order in for the C diff?

## 2016-07-13 NOTE — Telephone Encounter (Signed)
C. Diff by toxin and also c. Diff by PCR  Thanks

## 2016-07-14 ENCOUNTER — Other Ambulatory Visit: Payer: Self-pay | Admitting: Gastroenterology

## 2016-07-14 ENCOUNTER — Other Ambulatory Visit: Payer: Medicare Other

## 2016-07-14 DIAGNOSIS — R197 Diarrhea, unspecified: Secondary | ICD-10-CM | POA: Diagnosis not present

## 2016-07-15 LAB — CLOSTRIDIUM DIFFICILE BY PCR

## 2016-07-16 LAB — C. DIFFICILE GDH AND TOXIN A/B

## 2016-07-17 ENCOUNTER — Ambulatory Visit (INDEPENDENT_AMBULATORY_CARE_PROVIDER_SITE_OTHER): Payer: Medicare Other | Admitting: Gastroenterology

## 2016-07-17 ENCOUNTER — Encounter: Payer: Self-pay | Admitting: Gastroenterology

## 2016-07-17 ENCOUNTER — Telehealth: Payer: Self-pay | Admitting: Gastroenterology

## 2016-07-17 VITALS — BP 94/60 | HR 68 | Ht 64.0 in | Wt 158.4 lb

## 2016-07-17 DIAGNOSIS — I63433 Cerebral infarction due to embolism of bilateral posterior cerebral arteries: Secondary | ICD-10-CM

## 2016-07-17 DIAGNOSIS — R110 Nausea: Secondary | ICD-10-CM | POA: Diagnosis not present

## 2016-07-17 NOTE — Patient Instructions (Signed)
Will print your recent C. Diff test results. Return to see Dr. Ardis Hughs as needed.

## 2016-07-17 NOTE — Progress Notes (Signed)
Review of pertinent gastrointestinal problems: 1. FOBT positive stool, 06/2015 when in hosp 2. Diarrhea: started 06/2015: labs 07/2015: C. Diff + by PCR, routine stool culture neg, ova and parasites neg; CBC with slightly elevated WBC, not anemic. Was started on flagyl 500mg  tid on 10/5. Colonoscopy Dr. Ardis Hughs 07/2015 found several small polyps (not removed due to comorbidities), otherwise normal including normal terminal ileum.. Random biopsied were normal.  C diff again + by PCR 10/2015, treated with long tapering vanc course.  C. Diff + 04/2016, long tapering vanc course again. 3. Nausea/vomiting 2016/2017: EGD 07/2015 Dr. Ardis Hughs was normal. Chronic narcotics as well as some of her other daily meds may contribute (tramadol #2 SE, aricept #1 SE).   HPI: This is a   pleasant 70 year old woman who is here with her husband today. I last saw her about 6-7 months ago.  Chief complaint is mild persistent nausea  They asked for repeat C. difficile testing last week, this is required by a surgeon performing her upcoming elbow surgery. C. difficile test was negative.  Her weight is  Up 8 pounds since her last visit here 6 months ago (same scale)  She is having an elbow surgery.  Doing well, walking daily.    Her nausea improved over the summer, but has been needing zofran more and more lately.  Stools have been solid, dark (on iron).  No diarrhea in 2 or 3 months at least   ROS: complete GI ROS as described in HPI.  Constitutional:  No unintentional weight loss   Past Medical History:  Diagnosis Date  . Allergy   . Anxiety    panic attacks- on occas.   . Arthritis    osteoporosis - especially hips.   . Asthma   . Confusion   . Dementia   . Depression   . Diverticulosis   . ETOH abuse   . Falls   . GERD (gastroesophageal reflux disease)   . Hyperlipidemia   . Hypertension   . Hyponatremia 07/05/2015  . Memory loss   . Normal 24 hour ambulatory pH monitoring study    good acid  suppression  . Observed seizure-like activity ([place]) 07/05/2015   observed by husband , Dr Doy Mince  . Shortness of breath   . Stroke [facility])     Past Surgical History:  Procedure Laterality Date  . ABDOMINAL HYSTERECTOMY    . BREAST SURGERY     3x- biposies   . COLONOSCOPY WITH PROPOFOL N/A 08/05/2015   Procedure: COLONOSCOPY WITH PROPOFOL;  Surgeon: [name], MD;  Location: [facility] END[facility];  Service: Endoscopy;  Laterality: N/A;  . DOBUTAMINE STRESS ECHO  07/09/2001   normal  . ESOPHAGOGASTRODUODENOSCOPY  06/11/2006   normal  . ESOPHAGOGASTRODUODENOSCOPY (EGD) WITH PROPOFOL N/A 08/05/2015   Procedure: ESOPHAGOGASTRODUODENOSCOPY (EGD) WITH PROPOFOL;  Surgeon: [name], MD;  Location: [facility];  Service: Endoscopy;  Laterality: N/A;  . EYE SURGERY     cataracts removed, IOL- both eyes   . JOINT REPLACEMENT     bilateral hips  . LOOP RECORDER IMPLANT N/A 10/14/2014   Procedure: LOOP RECORDER IMPLANT;  Surgeon: Evans Lance, MD;  Location: [facility] CATH LAB;  Service: Cardiovascular;  Laterality: N/A;  . NASAL SINUS SURGERY  1990  . REVERSE SHOULDER ARTHROPLASTY Right 08/13/2014   Procedure: REVERSE SHOULDER ARTHROPLASTY;  Surgeon: Marin Shutter, MD;  Location: [place];  Service: Orthopedics;  Laterality: Right;  interscalene block  . SHOULDER ARTHROSCOPY Left   . TEE  WITHOUT CARDIOVERSION N/A 10/14/2014   Procedure: TRANSESOPHAGEAL ECHOCARDIOGRAM (TEE);  Surgeon: Sueanne Margarita, MD;  Location: [place];  Service: Cardiovascular;  Laterality: N/A;  . TOTAL HIP ARTHROPLASTY     left 1991/right 2008    Current Outpatient Prescriptions  Medication Sig Dispense Refill  . acetaminophen (TYLENOL) 650 MG CR tablet Take 650 mg by mouth.    Marland Kitchen amLODipine (NORVASC) 2.5 MG tablet TAKE 1 TABLET BY MOUTH DAILY    . atorvastatin (LIPITOR) 10 MG tablet Take 10 mg by mouth.    . budesonide-formoterol (SYMBICORT) 160-4.5 MCG/ACT inhaler INHALE 2 PUFFS INTO THE LUNGS 2 (TWO) TIMES DAILY  AS NEEDED (SHORTNESS OF BREATH).    . cephALEXin (KEFLEX) 500 MG capsule Take 2 capsules (1,000 mg total) by mouth 2 (two) times daily. 40 capsule 0  . citalopram (CELEXA) 20 MG tablet TAKE 1 TABLET BY MOUTH EVERY DAY 90 tablet 1  . clopidogrel (PLAVIX) 75 MG tablet TAKE 1 TABLET (75 MG TOTAL) BY MOUTH DAILY.    Marland Kitchen donepezil (ARICEPT) 10 MG tablet TAKE 1 TABLET BY MOUTH DAILY AT BEDTIME    . doxepin (SINEQUAN) 25 MG capsule TAKE 1 CAPSULE BY MOUTH DAILY AT BEDTIME  5  . fentaNYL (DURAGESIC - DOSED MCG/HR) 25 MCG/HR patch Place 25 mcg onto the skin every 3 (three) days.    . fluticasone (FLONASE) 50 MCG/ACT nasal spray Place into the nose.    . gabapentin (NEURONTIN) 400 MG capsule     . lansoprazole (PREVACID) 15 MG capsule Take 1 capsule (15 mg total) by mouth daily. 90 capsule 1  . montelukast (SINGULAIR) 10 MG tablet Take 10 mg by mouth.    . Multiple Vitamins-Minerals (EYE VITAMINS) CAPS Take 1 capsule by mouth daily.    . naproxen sodium (ANAPROX) 220 MG tablet Take 220 mg by mouth.    . Omega-3 Fatty Acids (FISH OIL PO) Take by mouth.    . ondansetron (ZOFRAN) 8 MG tablet Take 8 mg by mouth every 8 (eight) hours as needed.     Marland Kitchen oxyCODONE-acetaminophen (PERCOCET) 7.5-325 MG tablet Take 1 tablet by mouth 2 (two) times daily.  0  . polyvinyl alcohol (LIQUIFILM TEARS) 1.4 % ophthalmic solution Place 1 drop into both eyes as needed.     . promethazine (PHENERGAN) 25 MG tablet Take 25 mg by mouth.    . vitamin E 400 UNIT capsule Take by mouth.    . Vitamins/Minerals TABS Take by mouth.     No current facility-administered medications for this visit.     Allergies as of 07/17/2016 - Review Complete 07/17/2016  Allergen Reaction Noted  . Tramadol Other (See Comments) 09/07/2015  . Latex Rash 07/25/2007  . Penicillins Rash 07/25/2007    Family History  Problem Relation Age of Onset  . Heart block Mother   . Stroke Mother   . Heart attack Father   . Sudden death Brother   . Heart  attack Brother   . Hypertension Brother   . Esophageal cancer Brother   . Stroke Brother     Social History   Social History  . Marital status: Married    Spouse name: N/A  . Number of children: N/A  . Years of education: N/A   Occupational History  . retired Retired    [employer]   Social History Main Topics  . Smoking status: Never Smoker  . Smokeless tobacco: Never Used  . Alcohol use No  . Drug use: No  .  Sexual activity: Yes   Other Topics Concern  . Not on file   Social History Narrative   Married. Retired.       Physical Exam: BP 94/60 (BP Location: Left Arm, Patient Position: Sitting, Cuff Size: Normal)   Pulse 68   Ht 5\' 4"  (1.626 m)   Wt 158 lb 6.4 oz (71.8 kg)   BMI 27.19 kg/m  Constitutional: generally well-appearing Psychiatric: alert and oriented x3 Abdomen: soft, nontender, nondistended, no obvious ascites, no peritoneal signs, normal bowel sounds No peripheral edema noted in lower extremities  Assessment and plan: 70 y.o. female with chronic nausea, multifactorial likely medication related she will continue taking Zofran on an as-needed basis. Her weight is up 8 pounds since her last visit partly and due to liberalized antinausea medicine. She is having solid stools lately that are dark probably from iron. I have no suspicion of persistent C. difficile infection. We will print up her recent C. difficile testing so that they can take this to her surgeon performing her upcoming elbow surgery. She will follow-up on an as-needed basis.  Owens Loffler, MD [facility] 07/17/2016, 1:51 PM

## 2016-07-17 NOTE — Telephone Encounter (Signed)
Pt has been notified, had appt today

## 2016-07-18 DIAGNOSIS — M5030 Other cervical disc degeneration, unspecified cervical region: Secondary | ICD-10-CM | POA: Diagnosis not present

## 2016-07-18 DIAGNOSIS — Z79899 Other long term (current) drug therapy: Secondary | ICD-10-CM | POA: Diagnosis not present

## 2016-07-18 DIAGNOSIS — J3089 Other allergic rhinitis: Secondary | ICD-10-CM | POA: Diagnosis not present

## 2016-07-18 DIAGNOSIS — M47812 Spondylosis without myelopathy or radiculopathy, cervical region: Secondary | ICD-10-CM | POA: Diagnosis not present

## 2016-07-18 DIAGNOSIS — J3081 Allergic rhinitis due to animal (cat) (dog) hair and dander: Secondary | ICD-10-CM | POA: Diagnosis not present

## 2016-07-18 DIAGNOSIS — G894 Chronic pain syndrome: Secondary | ICD-10-CM | POA: Diagnosis not present

## 2016-07-18 DIAGNOSIS — M542 Cervicalgia: Secondary | ICD-10-CM | POA: Diagnosis not present

## 2016-07-18 DIAGNOSIS — Z79891 Long term (current) use of opiate analgesic: Secondary | ICD-10-CM | POA: Diagnosis not present

## 2016-07-20 ENCOUNTER — Ambulatory Visit (INDEPENDENT_AMBULATORY_CARE_PROVIDER_SITE_OTHER): Payer: Medicare Other | Admitting: Podiatry

## 2016-07-20 DIAGNOSIS — L89891 Pressure ulcer of other site, stage 1: Secondary | ICD-10-CM

## 2016-07-20 DIAGNOSIS — M79676 Pain in unspecified toe(s): Secondary | ICD-10-CM

## 2016-07-20 DIAGNOSIS — S91109D Unspecified open wound of unspecified toe(s) without damage to nail, subsequent encounter: Secondary | ICD-10-CM

## 2016-07-20 DIAGNOSIS — L03039 Cellulitis of unspecified toe: Secondary | ICD-10-CM

## 2016-07-20 NOTE — Progress Notes (Signed)

## 2016-07-27 ENCOUNTER — Other Ambulatory Visit: Payer: Self-pay | Admitting: Orthopedic Surgery

## 2016-07-28 ENCOUNTER — Encounter (HOSPITAL_BASED_OUTPATIENT_CLINIC_OR_DEPARTMENT_OTHER): Payer: Self-pay | Admitting: *Deleted

## 2016-07-28 NOTE — Progress Notes (Signed)
Loop recorder and other pmh d/w Dr Gifford Shave, ok for surgery at [facility] 08/01/16,  Device orders faxed.

## 2016-08-01 ENCOUNTER — Encounter (HOSPITAL_BASED_OUTPATIENT_CLINIC_OR_DEPARTMENT_OTHER): Payer: Self-pay | Admitting: *Deleted

## 2016-08-01 ENCOUNTER — Ambulatory Visit (HOSPITAL_BASED_OUTPATIENT_CLINIC_OR_DEPARTMENT_OTHER): Payer: Medicare Other | Admitting: Anesthesiology

## 2016-08-01 ENCOUNTER — Other Ambulatory Visit: Payer: Self-pay | Admitting: Family Medicine

## 2016-08-01 ENCOUNTER — Encounter (HOSPITAL_BASED_OUTPATIENT_CLINIC_OR_DEPARTMENT_OTHER)
Admission: RE | Disposition: A | Discharge status: Home / Self Care | Payer: Self-pay | Source: Ambulatory Visit | Attending: Orthopedic Surgery

## 2016-08-01 ENCOUNTER — Ambulatory Visit (HOSPITAL_BASED_OUTPATIENT_CLINIC_OR_DEPARTMENT_OTHER)
Admission: RE | Admit: 2016-08-01 | Discharge: 2016-08-01 | Disposition: A | Discharge status: Home / Self Care | Payer: Medicare Other | Source: Ambulatory Visit | Attending: Orthopedic Surgery | Admitting: Orthopedic Surgery

## 2016-08-01 DIAGNOSIS — G5621 Lesion of ulnar nerve, right upper limb: Secondary | ICD-10-CM | POA: Insufficient documentation

## 2016-08-01 DIAGNOSIS — Z7902 Long term (current) use of antithrombotics/antiplatelets: Secondary | ICD-10-CM | POA: Insufficient documentation

## 2016-08-01 DIAGNOSIS — Z79899 Other long term (current) drug therapy: Secondary | ICD-10-CM | POA: Diagnosis not present

## 2016-08-01 DIAGNOSIS — I10 Essential (primary) hypertension: Secondary | ICD-10-CM | POA: Diagnosis not present

## 2016-08-01 DIAGNOSIS — F0390 Unspecified dementia without behavioral disturbance: Secondary | ICD-10-CM | POA: Diagnosis not present

## 2016-08-01 DIAGNOSIS — E785 Hyperlipidemia, unspecified: Secondary | ICD-10-CM | POA: Diagnosis not present

## 2016-08-01 DIAGNOSIS — Z8673 Personal history of transient ischemic attack (TIA), and cerebral infarction without residual deficits: Secondary | ICD-10-CM | POA: Diagnosis not present

## 2016-08-01 DIAGNOSIS — K219 Gastro-esophageal reflux disease without esophagitis: Secondary | ICD-10-CM | POA: Insufficient documentation

## 2016-08-01 DIAGNOSIS — J45909 Unspecified asthma, uncomplicated: Secondary | ICD-10-CM | POA: Diagnosis not present

## 2016-08-01 DIAGNOSIS — Z791 Long term (current) use of non-steroidal anti-inflammatories (NSAID): Secondary | ICD-10-CM | POA: Diagnosis not present

## 2016-08-01 DIAGNOSIS — Z96643 Presence of artificial hip joint, bilateral: Secondary | ICD-10-CM | POA: Diagnosis not present

## 2016-08-01 DIAGNOSIS — F329 Major depressive disorder, single episode, unspecified: Secondary | ICD-10-CM | POA: Insufficient documentation

## 2016-08-01 DIAGNOSIS — Z7951 Long term (current) use of inhaled steroids: Secondary | ICD-10-CM | POA: Insufficient documentation

## 2016-08-01 DIAGNOSIS — J449 Chronic obstructive pulmonary disease, unspecified: Secondary | ICD-10-CM | POA: Diagnosis not present

## 2016-08-01 HISTORY — PX: ULNAR NERVE TRANSPOSITION: SHX2595

## 2016-08-01 SURGERY — ULNAR NERVE DECOMPRESSION/TRANSPOSITION
Anesthesia: General | Site: Elbow | Laterality: Right

## 2016-08-01 MED ORDER — DEXAMETHASONE SODIUM PHOSPHATE 10 MG/ML IJ SOLN
INTRAMUSCULAR | Status: DC | PRN
  Administered 2016-08-01: 4 mg via INTRAVENOUS

## 2016-08-01 MED ORDER — SCOPOLAMINE 1 MG/3DAYS TD PT72: 1.0000 | MEDICATED_PATCH | Freq: Once | TRANSDERMAL | Status: DC | PRN

## 2016-08-01 MED ORDER — LIDOCAINE 2% (20 MG/ML) 5 ML SYRINGE
INTRAMUSCULAR | Status: AC
  Filled 2016-08-01: qty 5

## 2016-08-01 MED ORDER — LIDOCAINE HCL (PF) 1 % IJ SOLN
INTRAMUSCULAR | Status: AC
  Filled 2016-08-01: qty 30

## 2016-08-01 MED ORDER — FENTANYL CITRATE (PF) 100 MCG/2ML IJ SOLN
25.0000 ug | INTRAMUSCULAR | Status: DC | PRN
  Administered 2016-08-01 (×2): 25 ug via INTRAVENOUS
  Administered 2016-08-01: 50 ug via INTRAVENOUS

## 2016-08-01 MED ORDER — MIDAZOLAM HCL 2 MG/2ML IJ SOLN: 1.0000 mg | INTRAMUSCULAR | Status: DC | PRN

## 2016-08-01 MED ORDER — PROPOFOL 500 MG/50ML IV EMUL
INTRAVENOUS | Status: AC
Start: 2016-08-01 — End: 2016-08-01
  Filled 2016-08-01: qty 50

## 2016-08-01 MED ORDER — CLINDAMYCIN PHOSPHATE 900 MG/50ML IV SOLN
900.0000 mg | INTRAVENOUS | Status: AC
  Administered 2016-08-01: 900 mg via INTRAVENOUS

## 2016-08-01 MED ORDER — MEPERIDINE HCL 25 MG/ML IJ SOLN: 6.2500 mg | INTRAMUSCULAR | Status: DC | PRN

## 2016-08-01 MED ORDER — PROPOFOL 10 MG/ML IV BOLUS
INTRAVENOUS | Status: AC
  Filled 2016-08-01: qty 20

## 2016-08-01 MED ORDER — GLYCOPYRROLATE 0.2 MG/ML IJ SOLN: 0.2000 mg | Freq: Once | INTRAMUSCULAR | Status: DC | PRN

## 2016-08-01 MED ORDER — LIDOCAINE HCL 2 % IJ SOLN
INTRAMUSCULAR | Status: AC
  Filled 2016-08-01: qty 20

## 2016-08-01 MED ORDER — PROPOFOL 10 MG/ML IV BOLUS
INTRAVENOUS | Status: DC | PRN
  Administered 2016-08-01: 10 mg via INTRAVENOUS

## 2016-08-01 MED ORDER — CLINDAMYCIN PHOSPHATE 900 MG/50ML IV SOLN
INTRAVENOUS | Status: AC
  Filled 2016-08-01: qty 50

## 2016-08-01 MED ORDER — BUPIVACAINE-EPINEPHRINE 0.25% -1:200000 IJ SOLN
INTRAMUSCULAR | Status: DC | PRN
  Administered 2016-08-01: 11 mL

## 2016-08-01 MED ORDER — FENTANYL CITRATE (PF) 100 MCG/2ML IJ SOLN
INTRAMUSCULAR | Status: AC
  Filled 2016-08-01: qty 2

## 2016-08-01 MED ORDER — 0.9 % SODIUM CHLORIDE (POUR BTL) OPTIME
TOPICAL | Status: DC | PRN
  Administered 2016-08-01: 300 mL

## 2016-08-01 MED ORDER — LIDOCAINE 2% (20 MG/ML) 5 ML SYRINGE
INTRAMUSCULAR | Status: DC | PRN
  Administered 2016-08-01: 80 mg via INTRAVENOUS

## 2016-08-01 MED ORDER — ONDANSETRON HCL 4 MG/2ML IJ SOLN
INTRAMUSCULAR | Status: AC
  Filled 2016-08-01: qty 2

## 2016-08-01 MED ORDER — DEXAMETHASONE SODIUM PHOSPHATE 10 MG/ML IJ SOLN
INTRAMUSCULAR | Status: AC
  Filled 2016-08-01: qty 1

## 2016-08-01 MED ORDER — FENTANYL CITRATE (PF) 100 MCG/2ML IJ SOLN
50.0000 ug | INTRAMUSCULAR | Status: DC | PRN
  Administered 2016-08-01: 50 ug via INTRAVENOUS

## 2016-08-01 MED ORDER — LACTATED RINGERS IV SOLN
INTRAVENOUS | Status: DC
  Administered 2016-08-01: 10 mL/h via INTRAVENOUS

## 2016-08-01 MED ORDER — LACTATED RINGERS IV SOLN: INTRAVENOUS | Status: DC

## 2016-08-01 SURGICAL SUPPLY — 62 items
BENZOIN TINCTURE PRP APPL 2/3 (GAUZE/BANDAGES/DRESSINGS) ×3 IMPLANT
BLADE MINI RND TIP GREEN BEAV (BLADE) IMPLANT
BLADE SURG 15 STRL LF DISP TIS (BLADE) ×1 IMPLANT
BLADE SURG 15 STRL SS (BLADE) ×3
BNDG CMPR 9X4 STRL LF SNTH (GAUZE/BANDAGES/DRESSINGS) ×1
BNDG COHESIVE 4X5 TAN STRL (GAUZE/BANDAGES/DRESSINGS) ×3 IMPLANT
BNDG ESMARK 4X9 LF (GAUZE/BANDAGES/DRESSINGS) ×3 IMPLANT
BNDG GAUZE ELAST 4 BULKY (GAUZE/BANDAGES/DRESSINGS) ×3 IMPLANT
CHLORAPREP W/TINT 26ML (MISCELLANEOUS) ×3 IMPLANT
CLOSURE WOUND 1/2 X4 (GAUZE/BANDAGES/DRESSINGS) ×1
CORDS BIPOLAR (ELECTRODE) ×3 IMPLANT
COVER BACK TABLE 60X90IN (DRAPES) ×3 IMPLANT
COVER MAYO STAND STRL (DRAPES) ×3 IMPLANT
CUFF TOURN SGL LL 18 NRW (TOURNIQUET CUFF) ×3 IMPLANT
DRAIN PENROSE 1/2X12 LTX STRL (WOUND CARE) IMPLANT
DRAIN PENROSE 1/4X12 LTX STRL (WOUND CARE) IMPLANT
DRAPE EXTREMITY T 121X128X90 (DRAPE) ×3 IMPLANT
DRAPE SURG 17X23 STRL (DRAPES) ×6 IMPLANT
DRAPE U-SHAPE 47X51 STRL (DRAPES) IMPLANT
DRSG ADAPTIC 3X8 NADH LF (GAUZE/BANDAGES/DRESSINGS) IMPLANT
DRSG EMULSION OIL 3X3 NADH (GAUZE/BANDAGES/DRESSINGS) ×3 IMPLANT
ELECT REM PT RETURN 9FT ADLT (ELECTROSURGICAL) ×3
ELECTRODE REM PT RTRN 9FT ADLT (ELECTROSURGICAL) ×1 IMPLANT
GLOVE BIO SURGEON STRL SZ7.5 (GLOVE) IMPLANT
GLOVE BIOGEL PI IND STRL 6.5 (GLOVE) ×1 IMPLANT
GLOVE BIOGEL PI IND STRL 7.0 (GLOVE) ×1 IMPLANT
GLOVE BIOGEL PI IND STRL 8 (GLOVE) ×1 IMPLANT
GLOVE BIOGEL PI INDICATOR 6.5 (GLOVE) ×2
GLOVE BIOGEL PI INDICATOR 7.0 (GLOVE) ×2
GLOVE BIOGEL PI INDICATOR 8 (GLOVE) ×2
GLOVE ECLIPSE 6.5 STRL STRAW (GLOVE) IMPLANT
GLOVE SURG SS PI 7.0 STRL IVOR (GLOVE) ×6 IMPLANT
GLOVE SURG SS PI 8.0 STRL IVOR (GLOVE) ×3 IMPLANT
GOWN STRL REUS W/ TWL LRG LVL3 (GOWN DISPOSABLE) ×2 IMPLANT
GOWN STRL REUS W/TWL LRG LVL3 (GOWN DISPOSABLE) ×6
GOWN STRL REUS W/TWL XL LVL3 (GOWN DISPOSABLE) ×3 IMPLANT
LOOP VESSEL MAXI BLUE (MISCELLANEOUS) IMPLANT
NEEDLE HYPO 25X1 1.5 SAFETY (NEEDLE) ×3 IMPLANT
NS IRRIG 1000ML POUR BTL (IV SOLUTION) ×3 IMPLANT
PACK BASIN DAY SURGERY FS (CUSTOM PROCEDURE TRAY) ×3 IMPLANT
PADDING CAST ABS 4INX4YD NS (CAST SUPPLIES)
PADDING CAST ABS COTTON 4X4 ST (CAST SUPPLIES) IMPLANT
PENCIL BUTTON HOLSTER BLD 10FT (ELECTRODE) IMPLANT
SLING ARM FOAM STRAP LRG (SOFTGOODS) IMPLANT
SLING ARM MED ADULT FOAM STRAP (SOFTGOODS) IMPLANT
SLING ARM XL FOAM STRAP (SOFTGOODS) IMPLANT
SPONGE GAUZE 4X4 12PLY STER LF (GAUZE/BANDAGES/DRESSINGS) ×3 IMPLANT
STOCKINETTE 6  STRL (DRAPES) ×2
STOCKINETTE 6 STRL (DRAPES) ×1 IMPLANT
STRIP CLOSURE SKIN 1/2X4 (GAUZE/BANDAGES/DRESSINGS) ×2 IMPLANT
SUT ETHILON 8 0 BV130 4 (SUTURE) IMPLANT
SUT VIC AB 0 SH 27 (SUTURE) IMPLANT
SUT VIC AB 2-0 SH 27 (SUTURE)
SUT VIC AB 2-0 SH 27XBRD (SUTURE) IMPLANT
SUT VIC AB 3-0 SH 27 (SUTURE)
SUT VIC AB 3-0 SH 27X BRD (SUTURE) IMPLANT
SUT VICRYL RAPIDE 4/0 PS 2 (SUTURE) ×3 IMPLANT
SYR BULB 3OZ (MISCELLANEOUS) ×3 IMPLANT
SYRINGE 10CC LL (SYRINGE) IMPLANT
TOWEL OR 17X24 6PK STRL BLUE (TOWEL DISPOSABLE) ×6 IMPLANT
TOWEL OR NON WOVEN STRL DISP B (DISPOSABLE) ×3 IMPLANT
UNDERPAD 30X30 (UNDERPADS AND DIAPERS) ×3 IMPLANT

## 2016-08-01 NOTE — Anesthesia Preprocedure Evaluation (Signed)
Anesthesia Evaluation  Patient identified by MRN, date of birth, ID band Patient awake    Reviewed: Allergy & Precautions, NPO status , Patient's Chart, lab work & pertinent test results  Airway Mallampati: I  TM Distance: >3 FB Neck ROM: Full    Dental  (+) Teeth Intact, Dental Advisory Given   Pulmonary asthma , neg sleep apnea, COPD,    breath sounds clear to auscultation       Cardiovascular hypertension, Pt. on medications  Rhythm:Regular Rate:Normal     Neuro/Psych    GI/Hepatic GERD  Medicated and Controlled,  Endo/Other    Renal/GU      Musculoskeletal   Abdominal   Peds  Hematology   Anesthesia Other Findings   Reproductive/Obstetrics                             Anesthesia Physical Anesthesia Plan  ASA: III  Anesthesia Plan: General   Post-op Pain Management:    Induction: Intravenous  Airway Management Planned: LMA  Additional Equipment:   Intra-op Plan:   Post-operative Plan: Extubation in OR  Informed Consent: I have reviewed the patients History and Physical, chart, labs and discussed the procedure including the risks, benefits and alternatives for the proposed anesthesia with the patient or authorized representative who has indicated his/her understanding and acceptance.   Dental advisory given  Plan Discussed with: CRNA, Anesthesiologist and Surgeon  Anesthesia Plan Comments:         Anesthesia Quick Evaluation

## 2016-08-01 NOTE — H&P (Signed)
Aimee Jones is an 70 y.o. female.   Chief Complaint: right hand N/T, elbow & hand pain HPI: Patient with ulnar neuropathic sx R UE,  Failed non-op measures.  Desires surgical exploration & decompression  Past Medical History:  Diagnosis Date  . Allergy   . Anxiety    panic attacks- on occas.   . Arthritis    osteoporosis - especially hips.   . Asthma   . Confusion   . Dementia   . Depression   . Diverticulosis   . ETOH abuse   . Falls   . GERD (gastroesophageal reflux disease)   . Hyperlipidemia   . Hypertension   . Hyponatremia 07/05/2015  . Memory loss   . Normal 24 hour ambulatory pH monitoring study    good acid suppression  . Observed seizure-like activity ([place]) 07/05/2015   observed by husband , Dr Doy Mince  . Shortness of breath   . Stroke [facility])     Past Surgical History:  Procedure Laterality Date  . ABDOMINAL HYSTERECTOMY    . BREAST SURGERY     3x- biposies   . COLONOSCOPY WITH PROPOFOL N/A 08/05/2015   Procedure: COLONOSCOPY WITH PROPOFOL;  Surgeon: [name], MD;  Location: [facility];  Service: Endoscopy;  Laterality: N/A;  . DOBUTAMINE STRESS ECHO  07/09/2001   normal  . ESOPHAGOGASTRODUODENOSCOPY  06/11/2006   normal  . ESOPHAGOGASTRODUODENOSCOPY (EGD) WITH PROPOFOL N/A 08/05/2015   Procedure: ESOPHAGOGASTRODUODENOSCOPY (EGD) WITH PROPOFOL;  Surgeon: [name], MD;  Location: [facility];  Service: Endoscopy;  Laterality: N/A;  . EYE SURGERY     cataracts removed, IOL- both eyes   . JOINT REPLACEMENT     bilateral hips  . LOOP RECORDER IMPLANT N/A 10/14/2014   Procedure: LOOP RECORDER IMPLANT;  Surgeon: Evans Lance, MD;  Location: [facility] CATH LAB;  Service: Cardiovascular;  Laterality: N/A;  . NASAL SINUS SURGERY  1990  . REVERSE SHOULDER ARTHROPLASTY Right 08/13/2014   Procedure: REVERSE SHOULDER ARTHROPLASTY;  Surgeon: Marin Shutter, MD;  Location: [place];  Service: Orthopedics;  Laterality: Right;  interscalene block  .  SHOULDER ARTHROSCOPY Left   . TEE WITHOUT CARDIOVERSION N/A 10/14/2014   Procedure: TRANSESOPHAGEAL ECHOCARDIOGRAM (TEE);  Surgeon: Sueanne Margarita, MD;  Location: [facility] ENDOSCOPY;  Service: Cardiovascular;  Laterality: N/A;  . TOTAL HIP ARTHROPLASTY     left 1991/right 2008    Family History  Problem Relation Age of Onset  . Heart block Mother   . Stroke Mother   . Heart attack Father   . Sudden death Brother   . Heart attack Brother   . Hypertension Brother   . Esophageal cancer Brother   . Stroke Brother    Social History:  reports that she has never smoked. She has never used smokeless tobacco. She reports that she does not drink alcohol or use drugs.  Allergies:  Allergies  Allergen Reactions  . Tramadol Other (See Comments)    Would avoid.  History of seizure on medication  . Latex Rash  . Penicillins Rash    .Marland KitchenHas patient had a PCN reaction causing immediate rash, facial/tongue/throat swelling, SOB or lightheadedness with hypotension: No Has patient had a PCN reaction causing severe rash involving mucus membranes or skin necrosis: No Has patient had a PCN reaction that required hospitalization No Has patient had a PCN reaction occurring within the last 10 years: No If all of the above answers are "NO", then may proceed with Cephalosporin use.  Medications Prior to Admission  Medication Sig Dispense Refill  . acetaminophen (TYLENOL) 650 MG CR tablet Take 650 mg by mouth.    Marland Kitchen amLODipine (NORVASC) 2.5 MG tablet TAKE 1 TABLET BY MOUTH DAILY    . atorvastatin (LIPITOR) 10 MG tablet Take 10 mg by mouth.    . budesonide-formoterol (SYMBICORT) 160-4.5 MCG/ACT inhaler INHALE 2 PUFFS INTO THE LUNGS 2 (TWO) TIMES DAILY AS NEEDED (SHORTNESS OF BREATH).    . citalopram (CELEXA) 20 MG tablet TAKE 1 TABLET BY MOUTH EVERY DAY 90 tablet 1  . clopidogrel (PLAVIX) 75 MG tablet TAKE 1 TABLET (75 MG TOTAL) BY MOUTH DAILY.    Marland Kitchen donepezil (ARICEPT) 10 MG tablet TAKE 1 TABLET BY MOUTH  DAILY AT BEDTIME    . doxepin (SINEQUAN) 25 MG capsule TAKE 1 CAPSULE BY MOUTH DAILY AT BEDTIME  5  . fentaNYL (DURAGESIC - DOSED MCG/HR) 25 MCG/HR patch Place 25 mcg onto the skin every 3 (three) days.    . fluticasone (FLONASE) 50 MCG/ACT nasal spray Place into the nose.    . montelukast (SINGULAIR) 10 MG tablet Take 10 mg by mouth.    . Multiple Vitamins-Minerals (EYE VITAMINS) CAPS Take 1 capsule by mouth daily.    . naproxen sodium (ANAPROX) 220 MG tablet Take 220 mg by mouth.    . Omega-3 Fatty Acids (FISH OIL PO) Take by mouth.    . ondansetron (ZOFRAN) 8 MG tablet Take 8 mg by mouth every 8 (eight) hours as needed.     Marland Kitchen oxyCODONE-acetaminophen (PERCOCET) 7.5-325 MG tablet Take 1 tablet by mouth 2 (two) times daily.  0  . polyvinyl alcohol (LIQUIFILM TEARS) 1.4 % ophthalmic solution Place 1 drop into both eyes as needed.     . pregabalin (LYRICA) 50 MG capsule Take 50 mg by mouth 3 (three) times daily.    . promethazine (PHENERGAN) 25 MG tablet Take 25 mg by mouth.    . vitamin E 400 UNIT capsule Take by mouth.    . Vitamins/Minerals TABS Take by mouth.      No results found for this or any previous visit (from the past 48 hour(s)). No results found.  Review of Systems  All other systems reviewed and are negative.   Blood pressure 115/79, pulse 81, temperature 97.9 F (36.6 C), temperature source Oral, resp. rate 18, height 5\' 6"  (1.676 m), weight 69.5 kg (153 lb 4.8 oz), SpO2 97 %. Physical Exam  Constitutional: She appears well-developed and well-nourished.  HENT:  Head: Normocephalic.  Eyes: Pupils are equal, round, and reactive to light.  Neck: Normal range of motion.  Cardiovascular: Intact distal pulses.   Respiratory: Effort normal.  GI: Soft.  Musculoskeletal:  Tinel's at ulnar nerve at elbow, altered LT sens RF/SF  Neurological: She is alert.  Skin: Skin is warm and dry.  Psychiatric: She has a normal mood and affect. Her behavior is normal.      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  G/R/O reviewed and consent obtained.  Stanley Helmuth A., MD 08/01/2016, 12:54 PM

## 2016-08-01 NOTE — Op Note (Signed)
08/01/2016  12:58 PM  PATIENT:  Aimee Jones  70 y.o. female  PRE-OPERATIVE DIAGNOSIS:  Right ulnar neuropathy at the elbow  POST-OPERATIVE DIAGNOSIS:  Same  PROCEDURE:  Right ulnar neuroplasty at the elbow  SURGEON: Rayvon Char. Grandville Silos, MD  PHYSICIAN ASSISTANT: Morley Kos, OPA-C  ANESTHESIA:  general  SPECIMENS:  None  DRAINS:   None  EBL:  less than 50 mL  PREOPERATIVE INDICATIONS:  Aimee Jones is a  70 y.o. female with right ulnar neuropathy that has failed nonop measures  The risks benefits and alternatives were discussed with the patient preoperatively including but not limited to the risks of infection, bleeding, nerve injury, cardiopulmonary complications, the need for revision surgery, among others, and the patient verbalized understanding and consented to proceed.  OPERATIVE IMPLANTS: none  OPERATIVE PROCEDURE:  After receiving prophylactic antibiotics, the patient was escorted to the operative theatre and placed in a supine position.  General anesthesia was administered.  A surgical "time-out" was performed during which the planned procedure, proposed operative site, and the correct patient identity were compared to the operative consent and agreement confirmed by the circulating nurse according to current facility policy.  Following application of a tourniquet to the operative extremity, the exposed skin was prepped with Chloraprep and draped in the usual sterile fashion.  The limb was exsanguinated with an Esmarch bandage and the tourniquet inflated to approximately 133mmHg higher than systolic BP.  Quarter percent Marcaine with epinephrine was instilled in the operative field.  A curvilinear incision was marked and made over the course of the ulnar nerve.  Subcutaneous tissues were dissected with blunt and spreading dissection.  Surgery was performed with 4 power loupe magnification.  Crossing cutaneous nerves were protected and elevated full-thickness in  the flaps.  At the distal aspect of the cubital tunnel proper, the nerve was identified and the fascia over it released.  This was then continued between the 2 bellies of the FCU.  The deep fascia of the FCU was not impinging upon the nerve.  The nerve was thus completely exposed and free distal to Osborne's ligament.  At the level of Osborne's ligament, the canal was tight, indenting the nerve.  The nerve was excitable to mechanical manipulation at this point.  Osborne's ligament was divided as was the fascia over the nerve proximally, elevating the skin and subcutaneous tissues full-thickness and releasing the nerve with direct visualization and scissor dissection.  Using digital probing, as far as I could feel proximally the nerve was free of kinking and obstruction, which was all the way into its course into the anterior compartment.  The elbow was then flexed and extended and the nerves found stable within the groove.  The tourniquet was released, the wound irrigated and the skin closed with 4-0 Vicryl Rapide deep dermal buried subcuticular sutures and a running horizontal mattress 4-0 Vicryl Rapide suture in the skin.  A bulky dressing was applied and she was taken to the recovery room in stable condition, breathing spontaneously.  DISPOSITION: She will be discharged home today with typical instructions, returning in 10-15 days.

## 2016-08-01 NOTE — Anesthesia Procedure Notes (Signed)
Procedure Name: LMA Insertion Date/Time: 08/01/2016 1:18 PM Performed by: Baxter Flattery Pre-anesthesia Checklist: Patient identified, Emergency Drugs available, Suction available and Patient being monitored Patient Re-evaluated:Patient Re-evaluated prior to inductionOxygen Delivery Method: Circle system utilized Preoxygenation: Pre-oxygenation with 100% oxygen Intubation Type: IV induction Ventilation: Mask ventilation without difficulty LMA: LMA inserted LMA Size: 3.0 Number of attempts: 1 Airway Equipment and Method: Bite block Placement Confirmation: positive ETCO2 and breath sounds checked- equal and bilateral Tube secured with: Tape Dental Injury: Teeth and Oropharynx as per pre-operative assessment

## 2016-08-01 NOTE — Transfer of Care (Signed)
Immediate Anesthesia Transfer of Care Note  Patient: [name]  Procedure(s) Performed: Procedure(s): RIGHT ULNAR NEUROPLASTY AT THE ELBOW (Right)  Patient Location: PACU  Anesthesia Type:General  Level of Consciousness: awake, sedated and responds to stimulation  Airway & Oxygen Therapy: Patient Spontanous Breathing and Patient connected to face mask oxygen  Post-op Assessment: Report given to RN, Post -op Vital signs reviewed and stable and Patient moving all extremities  Post vital signs: Reviewed and stable  Last Vitals:  Vitals:   08/01/16 1131 08/01/16 1349  BP: 115/79 106/63  Pulse: 81 (!) 57  Resp: 18 14  Temp: 36.6 C (P) 36.6 C    Last Pain:  Vitals:   08/01/16 1131  TempSrc: Oral  PainSc: 3          Complications: No apparent anesthesia complications

## 2016-08-01 NOTE — Discharge Instructions (Signed)
Discharge Instructions   You have a light dressing on your hand and elbow. You may begin gentle motion of your fingers and hand immediately, but you should not do any heavy lifting or gripping.  Elevate your hand to reduce pain & swelling of the digits.  Ice over the operative site and/or in the arm pit may be helpful to reduce pain & swelling.  DO NOT USE HEAT. Use your medicine as needed over the first 48 hours, and then you can begin to taper your use. You may use Tylenol in place of your prescribed pain medication, but not IN ADDITION to it. Leave the dressing in place until the third day after your surgery and then remove it, leaving it open to air.  After the bandage has been removed you may shower, regularly washing the incision and letting the water run over it, but not submerging it (no swimming, soaking it in dishwater, etc.) You may drive a car when you are off of prescription pain medications and can safely control your vehicle with both hands. We will address whether therapy will be required or not when you return to the office. You may have already made your follow-up appointment when we completed your preop visit.  If not, please call our office today or the next business day to make your return appointment for 10-15 days after surgery.   Please call [phone] during normal business hours or [phone] after hours for any problems. Including the following:  - excessive redness of the incisions - drainage for more than 4 days - fever of more than 101.5 F  *Please note that pain medications will not be refilled after hours or on weekends.   Post Anesthesia Home Care Instructions  Activity: Get plenty of rest for the remainder of the day. A responsible adult should stay with you for 24 hours following the procedure.  For the next 24 hours, DO NOT: -Drive a car -Paediatric nurse -Drink alcoholic beverages -Take any medication unless instructed by your physician -Make  any legal decisions or sign important papers.  Meals: Start with liquid foods such as gelatin or soup. Progress to regular foods as tolerated. Avoid greasy, spicy, heavy foods. If nausea and/or vomiting occur, drink only clear liquids until the nausea and/or vomiting subsides. Call your physician if vomiting continues.  Special Instructions/Symptoms: Your throat may feel dry or sore from the anesthesia or the breathing tube placed in your throat during surgery. If this causes discomfort, gargle with warm salt water. The discomfort should disappear within 24 hours.  If you had a scopolamine patch placed behind your ear for the management of post- operative nausea and/or vomiting:  1. The medication in the patch is effective for 72 hours, after which it should be removed.  Wrap patch in a tissue and discard in the trash. Wash hands thoroughly with soap and water. 2. You may remove the patch earlier than 72 hours if you experience unpleasant side effects which may include dry mouth, dizziness or visual disturbances. 3. Avoid touching the patch. Wash your hands with soap and water after contact with the patch.

## 2016-08-01 NOTE — Anesthesia Postprocedure Evaluation (Signed)
Anesthesia Post Note  Patient: [name]  Procedure(s) Performed: Procedure(s) (LRB): RIGHT ULNAR NEUROPLASTY AT THE ELBOW (Right)  Patient location during evaluation: PACU Anesthesia Type: General Level of consciousness: awake and alert Pain management: pain level controlled Vital Signs Assessment: post-procedure vital signs reviewed and stable Respiratory status: spontaneous breathing, nonlabored ventilation and respiratory function stable Cardiovascular status: blood pressure returned to baseline and stable Postop Assessment: no signs of nausea or vomiting Anesthetic complications: no    Last Vitals:  Vitals:   08/01/16 1445 08/01/16 1530  BP: 138/78 (!) 152/85  Pulse: 68 74  Resp: (!) 23 18  Temp:  36.6 C    Last Pain:  Vitals:   08/01/16 1530  TempSrc:   PainSc: 0-No pain                 Genesee Nase A

## 2016-08-02 ENCOUNTER — Encounter (HOSPITAL_BASED_OUTPATIENT_CLINIC_OR_DEPARTMENT_OTHER): Payer: Self-pay | Admitting: Orthopedic Surgery

## 2016-08-03 LAB — CUP PACEART REMOTE DEVICE CHECK: Date Time Interrogation Session: 20170922020633

## 2016-08-03 NOTE — Progress Notes (Signed)
Carelink summary report received. Battery status OK. Normal device function. No new symptom episodes, tachy episodes, brady, or pause episodes. No new AF episodes. Monthly summary reports and ROV/PRN 

## 2016-08-07 ENCOUNTER — Ambulatory Visit (INDEPENDENT_AMBULATORY_CARE_PROVIDER_SITE_OTHER): Payer: Medicare Other | Admitting: *Deleted

## 2016-08-07 DIAGNOSIS — I639 Cerebral infarction, unspecified: Secondary | ICD-10-CM | POA: Diagnosis not present

## 2016-08-07 NOTE — Progress Notes (Signed)
[facility] Summary Report / Loop Recorder 

## 2016-08-09 ENCOUNTER — Inpatient Hospital Stay: Payer: Medicare Other

## 2016-08-09 ENCOUNTER — Inpatient Hospital Stay: Payer: Medicare Other | Attending: Oncology

## 2016-08-09 ENCOUNTER — Other Ambulatory Visit: Payer: Self-pay | Admitting: Family Medicine

## 2016-08-09 ENCOUNTER — Ambulatory Visit: Payer: Medicare Other | Admitting: Oncology

## 2016-08-09 DIAGNOSIS — D509 Iron deficiency anemia, unspecified: Secondary | ICD-10-CM | POA: Insufficient documentation

## 2016-08-09 DIAGNOSIS — D473 Essential (hemorrhagic) thrombocythemia: Secondary | ICD-10-CM | POA: Diagnosis not present

## 2016-08-09 DIAGNOSIS — D72829 Elevated white blood cell count, unspecified: Secondary | ICD-10-CM | POA: Insufficient documentation

## 2016-08-09 LAB — CBC WITH DIFFERENTIAL/PLATELET
BASOS ABS: 0.1 10*3/uL (ref 0–0.1)
Basophils Relative: 1 %
Eosinophils Absolute: 0.6 10*3/uL (ref 0–0.7)
Eosinophils Relative: 8 %
HEMATOCRIT: 39.6 % (ref 35.0–47.0)
HEMOGLOBIN: 13.2 g/dL (ref 12.0–16.0)
LYMPHS PCT: 15 %
Lymphs Abs: 1.2 10*3/uL (ref 1.0–3.6)
MCH: 27.3 pg (ref 26.0–34.0)
MCHC: 33.3 g/dL (ref 32.0–36.0)
MCV: 81.9 fL (ref 80.0–100.0)
MONO ABS: 0.5 10*3/uL (ref 0.2–0.9)
MONOS PCT: 7 %
NEUTROS ABS: 5.5 10*3/uL (ref 1.4–6.5)
NEUTROS PCT: 69 %
Platelets: 632 10*3/uL — ABNORMAL HIGH (ref 150–440)
RBC: 4.84 MIL/uL (ref 3.80–5.20)
RDW: 19.4 % — AB (ref 11.5–14.5)
WBC: 8 10*3/uL (ref 3.6–11.0)

## 2016-08-09 LAB — IRON AND TIBC
Iron: 65 ug/dL (ref 28–170)
Saturation Ratios: 26 % (ref 10.4–31.8)
TIBC: 252 ug/dL (ref 250–450)
UIBC: 187 ug/dL

## 2016-08-09 LAB — FERRITIN: FERRITIN: 175 ng/mL (ref 11–307)

## 2016-08-10 ENCOUNTER — Telehealth: Payer: Self-pay | Admitting: Gastroenterology

## 2016-08-10 DIAGNOSIS — R197 Diarrhea, unspecified: Secondary | ICD-10-CM

## 2016-08-10 NOTE — Telephone Encounter (Signed)
The pt's husband calls and states she has had diarrhea again starting 5 days ago.  She is on iron daily and the stools are dark.  She was up 6 times last night.  She took 2 imodium last night and 2 today.  The imodium did not really help that much.  Stopped vanc 3-4 weeks ago.  The husband wants to know if she needs to get another c diff test?

## 2016-08-11 NOTE — Telephone Encounter (Signed)
Yes, c diff by PCR and also c. Diff by toxin assay

## 2016-08-11 NOTE — Telephone Encounter (Signed)
Pt's husband has been notified to pick up the kit at our basement lab when convenient

## 2016-08-15 DIAGNOSIS — M47812 Spondylosis without myelopathy or radiculopathy, cervical region: Secondary | ICD-10-CM | POA: Diagnosis not present

## 2016-08-15 DIAGNOSIS — G894 Chronic pain syndrome: Secondary | ICD-10-CM | POA: Diagnosis not present

## 2016-08-15 DIAGNOSIS — Z79899 Other long term (current) drug therapy: Secondary | ICD-10-CM | POA: Diagnosis not present

## 2016-08-15 DIAGNOSIS — Z79891 Long term (current) use of opiate analgesic: Secondary | ICD-10-CM | POA: Diagnosis not present

## 2016-08-15 DIAGNOSIS — M5030 Other cervical disc degeneration, unspecified cervical region: Secondary | ICD-10-CM | POA: Diagnosis not present

## 2016-08-15 DIAGNOSIS — M542 Cervicalgia: Secondary | ICD-10-CM | POA: Diagnosis not present

## 2016-08-16 ENCOUNTER — Other Ambulatory Visit: Payer: Self-pay | Admitting: Gastroenterology

## 2016-08-16 ENCOUNTER — Ambulatory Visit (INDEPENDENT_AMBULATORY_CARE_PROVIDER_SITE_OTHER): Payer: Medicare Other | Admitting: Podiatry

## 2016-08-16 ENCOUNTER — Other Ambulatory Visit: Payer: Medicare Other

## 2016-08-16 DIAGNOSIS — R197 Diarrhea, unspecified: Secondary | ICD-10-CM

## 2016-08-16 DIAGNOSIS — S91109D Unspecified open wound of unspecified toe(s) without damage to nail, subsequent encounter: Secondary | ICD-10-CM

## 2016-08-16 DIAGNOSIS — M79676 Pain in unspecified toe(s): Secondary | ICD-10-CM

## 2016-08-16 DIAGNOSIS — L02611 Cutaneous abscess of right foot: Secondary | ICD-10-CM

## 2016-08-16 DIAGNOSIS — L03039 Cellulitis of unspecified toe: Secondary | ICD-10-CM | POA: Diagnosis not present

## 2016-08-16 DIAGNOSIS — G5621 Lesion of ulnar nerve, right upper limb: Secondary | ICD-10-CM | POA: Diagnosis not present

## 2016-08-16 DIAGNOSIS — L03031 Cellulitis of right toe: Secondary | ICD-10-CM

## 2016-08-17 ENCOUNTER — Other Ambulatory Visit: Payer: Self-pay

## 2016-08-17 ENCOUNTER — Telehealth: Payer: Self-pay

## 2016-08-17 LAB — C. DIFFICILE GDH AND TOXIN A/B
C. difficile GDH: DETECTED — AB
C. difficile Toxin A/B: NOT DETECTED

## 2016-08-17 LAB — CLOSTRIDIUM DIFFICILE BY PCR: Toxigenic C. Difficile by PCR: DETECTED — CR

## 2016-08-17 MED ORDER — VANCOMYCIN 50 MG/ML ORAL SOLUTION: ORAL | 0 refills | Status: DC

## 2016-08-17 MED FILL — VANCOMYCIN 50MG/ML ORAL SOL: 14 days supply | Qty: 140 | Fill #0

## 2016-08-17 NOTE — Telephone Encounter (Signed)
See results note. 

## 2016-08-17 NOTE — Telephone Encounter (Signed)
FYI:  Critical lab called from [facility] C diff positive.

## 2016-08-18 LAB — CLOSTRIDIUM DIFFICILE BY PCR

## 2016-08-20 NOTE — Progress Notes (Signed)
Subjective: Patient presents today 6 weeks post ingrown nail permanent nail avulsion procedure. Patient states that for a few weeks she noticed significant improvement however within the last week it began to get red and erythematous with localized cellulitis Patient presents today for further treatment and evaluation  Objective: Skin is warm, dry and supple. Nail appears to be healing appropriately. Open wound to the associated nail fold with a granular wound base and moderate amount of fibrotic tissue. Minimal drainage noted. Mild erythema around the periungual region possibly due to localized infection   Assessment: #1 postop permanent partial nail avulsion #2 open wound periungual nail fold of respective digit. #3 cellulitis-localized   Plan of care: #1 patient was evaluated  #2 debridement of open wound was performed to the periungual border of the respective toe using a currette. Antibiotic ointment and Band-Aid was applied. #3 patient is currently on antibiotic treatment for her right elbow. Informed the patient that the antibiotics she is taking orally will help with her localized infection of her toenail. #4 patient is to return to clinic on a PRN  basis.

## 2016-08-23 DIAGNOSIS — G5621 Lesion of ulnar nerve, right upper limb: Secondary | ICD-10-CM | POA: Diagnosis not present

## 2016-08-31 MED FILL — VANCOMYCIN 50MG/ML ORAL SOL: 14 days supply | Qty: 120 | Fill #0

## 2016-09-01 ENCOUNTER — Ambulatory Visit (INDEPENDENT_AMBULATORY_CARE_PROVIDER_SITE_OTHER): Payer: Medicare Other | Admitting: Primary Care

## 2016-09-01 VITALS — BP 118/72 | HR 82 | Temp 98.3°F | Ht 64.0 in | Wt 152.4 lb

## 2016-09-01 DIAGNOSIS — I63433 Cerebral infarction due to embolism of bilateral posterior cerebral arteries: Secondary | ICD-10-CM | POA: Diagnosis not present

## 2016-09-01 DIAGNOSIS — R300 Dysuria: Secondary | ICD-10-CM | POA: Diagnosis not present

## 2016-09-01 LAB — POC URINALSYSI DIPSTICK (AUTOMATED)
Bilirubin, UA: NEGATIVE
Blood, UA: NEGATIVE
Glucose, UA: NEGATIVE
Ketones, UA: NEGATIVE
Nitrite, UA: NEGATIVE
Protein, UA: NEGATIVE
Spec Grav, UA: 1.02
Urobilinogen, UA: NEGATIVE
pH, UA: 6

## 2016-09-01 NOTE — Patient Instructions (Signed)
We will send off your urine for culture and will be in touch with you once we receive these results.  Ensure you are staying hydrated with water.  It was a pleasure to see you today!

## 2016-09-01 NOTE — Progress Notes (Signed)
Pre visit review using our clinic review tool, if applicable. No additional management support is needed unless otherwise documented below in the visit note. 

## 2016-09-01 NOTE — Progress Notes (Signed)
Subjective:    Patient ID: Aimee Jones, female    DOB: Jul 27, 1946, 70 y.o.   MRN: [id number]  HPI  Ms. Vermilyea is a 70 year old female with a history of CVA, recurrent UTI, dementia, urinary retention who presents today with a chief complaint of dysuria. She has a history of dementia so her husband is providing information for the HPI. He's also noticed a red tint in her urine. Her symptoms have been present intermittently for the past 2 weeks. She is currently under treatment for c-diff with Vancomycin liquid. She denies fevers, chills, abdominal pain, vaginal discharge, vaginal itching. She's not taking anything OTC for her symptoms.   Review of Systems  Constitutional: Negative for chills, fatigue and fever.  Gastrointestinal: Negative for abdominal pain and nausea.  Genitourinary: Positive for dysuria and hematuria. Negative for flank pain, frequency and vaginal discharge.  Neurological: Negative for weakness.       Past Medical History:  Diagnosis Date  . Allergy   . Anxiety    panic attacks- on occas.   . Arthritis    osteoporosis - especially hips.   . Asthma   . Confusion   . Dementia   . Depression   . Diverticulosis   . ETOH abuse   . Falls   . GERD (gastroesophageal reflux disease)   . Hyperlipidemia   . Hypertension   . Hyponatremia 07/05/2015  . Memory loss   . Normal 24 hour ambulatory pH monitoring study    good acid suppression  . Observed seizure-like activity ([place]) 07/05/2015   observed by husband , Dr Doy Mince  . Shortness of breath   . Stroke [facility])      Social History   Social History  . Marital status: Married    Spouse name: N/A  . Number of children: N/A  . Years of education: N/A   Occupational History  . retired Retired    [employer]   Social History Main Topics  . Smoking status: Never Smoker  . Smokeless tobacco: Never Used  . Alcohol use No  . Drug use: No  . Sexual activity: Yes   Other Topics Concern  . Not on file    Social History Narrative   Married. Retired.      Past Surgical History:  Procedure Laterality Date  . ABDOMINAL HYSTERECTOMY    . BREAST SURGERY     3x- biposies   . COLONOSCOPY WITH PROPOFOL N/A 08/05/2015   Procedure: COLONOSCOPY WITH PROPOFOL;  Surgeon: [name], MD;  Location: [facility] ENDOSCOPY;  Service: Endoscopy;  Laterality: N/A;  . DOBUTAMINE STRESS ECHO  07/09/2001   normal  . ESOPHAGOGASTRODUODENOSCOPY  06/11/2006   normal  . ESOPHAGOGASTRODUODENOSCOPY (EGD) WITH PROPOFOL N/A 08/05/2015   Procedure: ESOPHAGOGASTRODUODENOSCOPY (EGD) WITH PROPOFOL;  Surgeon: [name], MD;  Location: [facility];  Service: Endoscopy;  Laterality: N/A;  . EYE SURGERY     cataracts removed, IOL- both eyes   . JOINT REPLACEMENT     bilateral hips  . LOOP RECORDER IMPLANT N/A 10/14/2014   Procedure: LOOP RECORDER IMPLANT;  Surgeon: Evans Lance, MD;  Location: [facility] CATH LAB;  Service: Cardiovascular;  Laterality: N/A;  . NASAL SINUS SURGERY  1990  . REVERSE SHOULDER ARTHROPLASTY Right 08/13/2014   Procedure: REVERSE SHOULDER ARTHROPLASTY;  Surgeon: Marin Shutter, MD;  Location: [place];  Service: Orthopedics;  Laterality: Right;  interscalene block  . SHOULDER ARTHROSCOPY Left   . TEE WITHOUT CARDIOVERSION N/A 10/14/2014  Procedure: TRANSESOPHAGEAL ECHOCARDIOGRAM (TEE);  Surgeon: Sueanne Margarita, MD;  Location: [place];  Service: Cardiovascular;  Laterality: N/A;  . TOTAL HIP ARTHROPLASTY     left 1991/right 2008  . ULNAR NERVE TRANSPOSITION Right 08/01/2016   Procedure: RIGHT ULNAR NEUROPLASTY AT THE ELBOW;  Surgeon: Milly Jakob, MD;  Location: [place];  Service: Orthopedics;  Laterality: Right;    Family History  Problem Relation Age of Onset  . Heart block Mother   . Stroke Mother   . Heart attack Father   . Sudden death Brother   . Heart attack Brother   . Hypertension Brother   . Esophageal cancer Brother   . Stroke Brother     Allergies   Allergen Reactions  . Tramadol Other (See Comments)    Would avoid.  History of seizure on medication  . Latex Rash  . Penicillins Rash    .Marland KitchenHas patient had a PCN reaction causing immediate rash, facial/tongue/throat swelling, SOB or lightheadedness with hypotension: No Has patient had a PCN reaction causing severe rash involving mucus membranes or skin necrosis: No Has patient had a PCN reaction that required hospitalization No Has patient had a PCN reaction occurring within the last 10 years: No If all of the above answers are "NO", then may proceed with Cephalosporin use.      Current Outpatient Prescriptions on File Prior to Visit  Medication Sig Dispense Refill  . acetaminophen (TYLENOL) 650 MG CR tablet Take 650 mg by mouth.    Marland Kitchen amLODipine (NORVASC) 2.5 MG tablet TAKE 1 TABLET BY MOUTH DAILY    . atorvastatin (LIPITOR) 10 MG tablet TAKE 1 TABLET BY MOUTH DAILY 90 tablet 1  . budesonide-formoterol (SYMBICORT) 160-4.5 MCG/ACT inhaler INHALE 2 PUFFS INTO THE LUNGS 2 (TWO) TIMES DAILY AS NEEDED (SHORTNESS OF BREATH).    . citalopram (CELEXA) 20 MG tablet TAKE 1 TABLET BY MOUTH EVERY DAY 90 tablet 1  . clopidogrel (PLAVIX) 75 MG tablet TAKE 1 TABLET (75 MG TOTAL) BY MOUTH DAILY.    Marland Kitchen donepezil (ARICEPT) 10 MG tablet TAKE 1 TABLET BY MOUTH DAILY AT BEDTIME 90 tablet 1  . doxepin (SINEQUAN) 25 MG capsule TAKE 1 CAPSULE BY MOUTH DAILY AT BEDTIME  5  . fentaNYL (DURAGESIC - DOSED MCG/HR) 25 MCG/HR patch Place 25 mcg onto the skin every 3 (three) days.    . fluticasone (FLONASE) 50 MCG/ACT nasal spray Place into the nose.    . lansoprazole (PREVACID) 15 MG capsule TAKE ONE CAPSULE BY MOUTH EVERY DAY 90 capsule 1  . montelukast (SINGULAIR) 10 MG tablet Take 10 mg by mouth.    . Multiple Vitamins-Minerals (EYE VITAMINS) CAPS Take 1 capsule by mouth daily.    . naproxen sodium (ANAPROX) 220 MG tablet Take 220 mg by mouth.    . Omega-3 Fatty Acids (FISH OIL PO) Take by mouth.    .  ondansetron (ZOFRAN) 8 MG tablet Take 8 mg by mouth every 8 (eight) hours as needed.     Marland Kitchen oxyCODONE-acetaminophen (PERCOCET) 7.5-325 MG tablet Take 1 tablet by mouth 2 (two) times daily.  0  . polyvinyl alcohol (LIQUIFILM TEARS) 1.4 % ophthalmic solution Place 1 drop into both eyes as needed.     . pregabalin (LYRICA) 50 MG capsule Take 50 mg by mouth 3 (three) times daily.    . promethazine (PHENERGAN) 25 MG tablet Take 25 mg by mouth.    . vancomycin (VANCOCIN) 50 mg/mL oral solution 125 mg 4 times  daily for 2 weeks, 3 times daily for 2 weeks, 2 times daily for 2 weeks, 1 time daily for 1 week, then every other day for 1 week 350 mL 0  . vitamin E 400 UNIT capsule Take by mouth.    . Vitamins/Minerals TABS Take by mouth.     No current facility-administered medications on file prior to visit.     BP 118/72   Pulse 82   Temp 98.3 F (36.8 C) (Oral)   Ht 5\' 4"  (1.626 m)   Wt 152 lb 6.4 oz (69.1 kg)   SpO2 96%   BMI 26.16 kg/m    Objective:   Physical Exam  Constitutional: She appears well-nourished. She does not appear ill.  Neck: Neck supple.  Cardiovascular: Normal rate and regular rhythm.   Pulmonary/Chest: Effort normal and breath sounds normal.  Abdominal: Soft. There is no tenderness. There is no CVA tenderness.  Skin: Skin is warm and dry.          Assessment & Plan:  Dysuria:  Intermittently for the past 2 weeks. Also with what sounds to be hematuria. No vaginal symptoms. No other urinary symptoms. Also on treatment with oral Vancomycin for C-diff. UA: 2+ leuks, negative nitrites, negative blood. Culture sent. Will await culture prior to treatment to avoid unnecessary antibiotic exposure. Discussed to push fluids.  Sheral Flow, NP

## 2016-09-02 LAB — CUP PACEART REMOTE DEVICE CHECK
Implantable Pulse Generator Implant Date: 20151230
MDC IDC SESS DTM: 20171022031334

## 2016-09-02 NOTE — Progress Notes (Signed)
Carelink summary report received. Battery status OK. Normal device function. No new symptom episodes, tachy episodes, brady, or pause episodes. No new AF episodes. Monthly summary reports and ROV/PRN 

## 2016-09-03 LAB — URINE CULTURE

## 2016-09-04 ENCOUNTER — Ambulatory Visit (INDEPENDENT_AMBULATORY_CARE_PROVIDER_SITE_OTHER): Payer: Medicare Other | Admitting: Podiatry

## 2016-09-04 ENCOUNTER — Ambulatory Visit (INDEPENDENT_AMBULATORY_CARE_PROVIDER_SITE_OTHER): Payer: Medicare Other | Admitting: *Deleted

## 2016-09-04 ENCOUNTER — Telehealth: Payer: Self-pay | Admitting: *Deleted

## 2016-09-04 ENCOUNTER — Other Ambulatory Visit: Payer: Self-pay | Admitting: Family Medicine

## 2016-09-04 DIAGNOSIS — I639 Cerebral infarction, unspecified: Secondary | ICD-10-CM | POA: Diagnosis not present

## 2016-09-04 DIAGNOSIS — L03032 Cellulitis of left toe: Secondary | ICD-10-CM | POA: Diagnosis not present

## 2016-09-04 DIAGNOSIS — M79675 Pain in left toe(s): Secondary | ICD-10-CM

## 2016-09-04 DIAGNOSIS — L603 Nail dystrophy: Secondary | ICD-10-CM

## 2016-09-04 MED ORDER — MUPIROCIN CALCIUM 2 % EX CREA: 1.0000 "application " | TOPICAL_CREAM | Freq: Two times a day (BID) | CUTANEOUS | 1 refills | Status: DC

## 2016-09-04 MED ORDER — MUPIROCIN 2 % EX OINT: TOPICAL_OINTMENT | CUTANEOUS | 0 refills | Status: DC

## 2016-09-04 NOTE — Telephone Encounter (Signed)
Pt's husband states the medication Dr. Amalia Hailey prescribed today is not at the [facility] [address].  I reviewed Medication orders and the rx was sent to [facility]. I changed to Mupirocin ointment due to cost and [facility] to [facility]. I informed pt's husband of the change in pharmacy.

## 2016-09-05 NOTE — Progress Notes (Signed)
[facility] Summary Report / Loop Recorder 

## 2016-09-13 ENCOUNTER — Other Ambulatory Visit: Payer: Self-pay | Admitting: Podiatry

## 2016-09-13 DIAGNOSIS — G894 Chronic pain syndrome: Secondary | ICD-10-CM | POA: Diagnosis not present

## 2016-09-13 DIAGNOSIS — Z79899 Other long term (current) drug therapy: Secondary | ICD-10-CM | POA: Diagnosis not present

## 2016-09-13 DIAGNOSIS — Z79891 Long term (current) use of opiate analgesic: Secondary | ICD-10-CM | POA: Diagnosis not present

## 2016-09-13 DIAGNOSIS — M47812 Spondylosis without myelopathy or radiculopathy, cervical region: Secondary | ICD-10-CM | POA: Diagnosis not present

## 2016-09-13 DIAGNOSIS — M542 Cervicalgia: Secondary | ICD-10-CM | POA: Diagnosis not present

## 2016-09-13 DIAGNOSIS — M25519 Pain in unspecified shoulder: Secondary | ICD-10-CM | POA: Diagnosis not present

## 2016-09-14 DIAGNOSIS — H353110 Nonexudative age-related macular degeneration, right eye, stage unspecified: Secondary | ICD-10-CM | POA: Diagnosis not present

## 2016-09-14 DIAGNOSIS — Z961 Presence of intraocular lens: Secondary | ICD-10-CM | POA: Diagnosis not present

## 2016-09-14 DIAGNOSIS — H40013 Open angle with borderline findings, low risk, bilateral: Secondary | ICD-10-CM | POA: Diagnosis not present

## 2016-09-14 DIAGNOSIS — H353120 Nonexudative age-related macular degeneration, left eye, stage unspecified: Secondary | ICD-10-CM | POA: Diagnosis not present

## 2016-09-14 LAB — HM DIABETES EYE EXAM

## 2016-09-14 MED FILL — VANCOMYCIN 50MG/ML ORAL SOL: 14 days supply | Qty: 80 | Fill #0

## 2016-09-15 ENCOUNTER — Telehealth: Payer: Self-pay | Admitting: *Deleted

## 2016-09-15 MED ORDER — MUPIROCIN 2 % EX OINT: TOPICAL_OINTMENT | CUTANEOUS | 0 refills | Status: DC

## 2016-09-15 NOTE — Telephone Encounter (Signed)
Received request to change Mupirocin cream to ointment. Dr. Trinidad Curet the change to ointment.

## 2016-09-18 ENCOUNTER — Ambulatory Visit (INDEPENDENT_AMBULATORY_CARE_PROVIDER_SITE_OTHER): Payer: Medicare Other | Admitting: Podiatry

## 2016-09-18 DIAGNOSIS — L602 Onychogryphosis: Secondary | ICD-10-CM

## 2016-09-18 DIAGNOSIS — M79676 Pain in unspecified toe(s): Secondary | ICD-10-CM | POA: Diagnosis not present

## 2016-09-18 DIAGNOSIS — S91109D Unspecified open wound of unspecified toe(s) without damage to nail, subsequent encounter: Secondary | ICD-10-CM

## 2016-09-18 DIAGNOSIS — L03039 Cellulitis of unspecified toe: Secondary | ICD-10-CM

## 2016-09-18 DIAGNOSIS — I63433 Cerebral infarction due to embolism of bilateral posterior cerebral arteries: Secondary | ICD-10-CM

## 2016-09-18 NOTE — Progress Notes (Signed)
Subjective: Patient presents today 2 weeks post ingrown nail permanent nail avulsion procedure. Patient states that the toe and nail fold is feeling much better.  Objective: Skin is warm, dry and supple. Nail and respective nail fold appears to be healing appropriately. Open wound to the associated nail fold with a granular wound base and moderate amount of fibrotic tissue. Minimal drainage noted. Mild erythema around the periungual region likely due to phenol chemical matricectomy.  Assessment: #1 postop permanent partial nail avulsion #2 open wound periungual nail fold of respective digit.  #3 elongated toenails 1 through 5 bilateral  Plan of care: #1 patient was evaluated  #2 debridement of open wound was performed to the periungual border of the respective toe using a currette. Antibiotic ointment and Band-Aid was applied. #3 mechanical debridement of nails 1 through 5 bilaterally was performed using a nail nipper without incident. #4 patient is to return to clinic on a PRN  basis.

## 2016-09-19 ENCOUNTER — Encounter: Payer: Self-pay | Admitting: Family Medicine

## 2016-09-24 NOTE — Progress Notes (Signed)
Subjective:  Patient presents today with her husband for pain and tenderness to the left great toenail. Patient states that it is sore and painful and that the pain will not go away. Patient's been dealing with a painful toenail for several months now. Patient presents today for further treatment and evaluation    Objective/Physical Exam General: The patient is alert and oriented x3 in no acute distress.  Dermatology: Thickened, elongated, onychodystrophy nail noted to the left great toe. This toenail is painful with direct compression and palpation. Skin is warm, dry and supple bilateral lower extremities. Negative for open lesions or macerations.  Vascular: Palpable pedal pulses bilaterally. No edema or erythema noted. Capillary refill within normal limits.  Neurological: Epicritic and protective threshold grossly intact bilaterally.   Musculoskeletal Exam: Range of motion within normal limits to all pedal and ankle joints bilateral. Muscle strength 5/5 in all groups bilateral.    Assessment: #1 painful toenail left great toe #2 onychodystrophy nail left great toe #3 periungual inflammation left great toe   Plan of Care:  #1 Patient was evaluated. #2 conservative versus procedural management of left great toenail was discussed today. Today the patient opts for total temporary nail avulsion to the left great toe. #3 total temporary nail avulsion was performed to the left great toe. Prior to procedure local anesthesia infiltration was utilized using a 50-50 mixture of 2% lidocaine plain with 0.5% Marcaine plain in a hallux block fashion. Betadine was utilized for aseptic technique. #4 total nail was avulsed and alcohol flush utilized. Light dressing applied #5 prescription for mupirocin 2% cream #6 return to clinic in 2 weeks   Edrick Kins, DPM [facility]  Dr. Edrick Kins, [address]                                        [place], [place] [address]                 Office [phone]  Fax [phone]

## 2016-09-29 MED FILL — VANCOMYCIN 50MG/ML ORAL SOL: 14 days supply | Qty: 40 | Fill #0

## 2016-10-04 ENCOUNTER — Ambulatory Visit (INDEPENDENT_AMBULATORY_CARE_PROVIDER_SITE_OTHER): Payer: Medicare Other | Admitting: *Deleted

## 2016-10-04 DIAGNOSIS — I639 Cerebral infarction, unspecified: Secondary | ICD-10-CM | POA: Diagnosis not present

## 2016-10-05 NOTE — Progress Notes (Signed)
[facility] Summary Report / Loop Recorder 

## 2016-10-12 DIAGNOSIS — Z79899 Other long term (current) drug therapy: Secondary | ICD-10-CM | POA: Diagnosis not present

## 2016-10-12 DIAGNOSIS — M5030 Other cervical disc degeneration, unspecified cervical region: Secondary | ICD-10-CM | POA: Diagnosis not present

## 2016-10-12 DIAGNOSIS — G894 Chronic pain syndrome: Secondary | ICD-10-CM | POA: Diagnosis not present

## 2016-10-12 DIAGNOSIS — Z79891 Long term (current) use of opiate analgesic: Secondary | ICD-10-CM | POA: Diagnosis not present

## 2016-10-12 DIAGNOSIS — M47812 Spondylosis without myelopathy or radiculopathy, cervical region: Secondary | ICD-10-CM | POA: Diagnosis not present

## 2016-10-12 DIAGNOSIS — M25519 Pain in unspecified shoulder: Secondary | ICD-10-CM | POA: Diagnosis not present

## 2016-10-17 LAB — CUP PACEART REMOTE DEVICE CHECK
Date Time Interrogation Session: 20171121043846
Implantable Pulse Generator Implant Date: 20151230

## 2016-10-20 ENCOUNTER — Telehealth: Payer: Self-pay

## 2016-10-20 NOTE — Telephone Encounter (Signed)
He should call back in 4-5 days to report how she is doing.  Should stay on scheduled imodium (1-2 pills every morning) for now.

## 2016-10-20 NOTE — Telephone Encounter (Signed)
Incoming call:  Pt finished her meds for C-diff and started to have diarrhea again    Spoke to husband, he states his wife finished her liquid vancomycin on 12/29 and did not have any more diarrhea. On 10/16/16, to celebrate the New Year, had given her collard greens and black eyed peas, since then she has been having diarrhea a couple of times a day, he tried giving her some anti-diarrhea medication without much relief of symptoms. Patient is not running a fever, denies abdominal pain or showing symptoms of in pain. Please advise.

## 2016-10-20 NOTE — Telephone Encounter (Signed)
Patient's husband advised of Dr. Eugenia Pancoast recommendations. He will call back in 4-5 days to let us know how she is doing.

## 2016-11-03 ENCOUNTER — Ambulatory Visit (INDEPENDENT_AMBULATORY_CARE_PROVIDER_SITE_OTHER): Payer: Medicare Other | Admitting: *Deleted

## 2016-11-03 ENCOUNTER — Other Ambulatory Visit: Payer: Self-pay | Admitting: *Deleted

## 2016-11-03 DIAGNOSIS — D509 Iron deficiency anemia, unspecified: Secondary | ICD-10-CM

## 2016-11-03 DIAGNOSIS — I639 Cerebral infarction, unspecified: Secondary | ICD-10-CM

## 2016-11-06 NOTE — Progress Notes (Signed)
[facility] Summary Report / Loop Recorder 

## 2016-11-08 ENCOUNTER — Other Ambulatory Visit: Payer: Self-pay | Admitting: Oncology

## 2016-11-08 DIAGNOSIS — G894 Chronic pain syndrome: Secondary | ICD-10-CM | POA: Diagnosis not present

## 2016-11-08 DIAGNOSIS — M47812 Spondylosis without myelopathy or radiculopathy, cervical region: Secondary | ICD-10-CM | POA: Diagnosis not present

## 2016-11-08 DIAGNOSIS — R531 Weakness: Secondary | ICD-10-CM | POA: Diagnosis not present

## 2016-11-08 DIAGNOSIS — M5030 Other cervical disc degeneration, unspecified cervical region: Secondary | ICD-10-CM | POA: Diagnosis not present

## 2016-11-08 DIAGNOSIS — M542 Cervicalgia: Secondary | ICD-10-CM | POA: Diagnosis not present

## 2016-11-08 DIAGNOSIS — Z79891 Long term (current) use of opiate analgesic: Secondary | ICD-10-CM | POA: Diagnosis not present

## 2016-11-08 DIAGNOSIS — Z79899 Other long term (current) drug therapy: Secondary | ICD-10-CM | POA: Diagnosis not present

## 2016-11-08 NOTE — Progress Notes (Signed)
[name]  [phone]) [phone] [phone]  ID: Aimee Jones OB: June 15, 1946  MR#: [id number]  [id number]  Patient Care Team: Tonia Ghent, MD as PCP - General (Family Medicine) Justice Britain, MD as Consulting Physician (Orthopedic Surgery)  CHIEF COMPLAINT: Leukocytosis, iron deficiency anemia, thrombocytosis.  INTERVAL HISTORY: Patient returns to clinic today, accompanied by her husband, for repeat laboratory work and further evaluation.  She is a somewhat poor historian, and her husband helps in answering questions. She has poor short-term memory and is forgetful.  She continues to have significant chronic pain which is being managed by her primary care physician.  She continues to have persistent fatigue, but denies weakness. She reports consistent headaches, which her husband attributes to rebound from pain medications.  She has no other neurologic complaints.  She reports congestion with a current cold, denies any recent fevers. She has a good appetite, and she has had unintentional weight loss of 10 pounds over 3 months.  She reports shortness of breath, controlled with asthma inhaler, managed by her PCP.  She denies any chest pain.  She reports occasional nausea, self-resolving, and denies any vomiting, constipation, or diarrhea. She reports no hematuria or hematochezia. She has no urinary complaints. Patient offers no further specific complaints today.  REVIEW OF SYSTEMS:   Review of Systems  Constitutional: Positive for malaise/fatigue and weight loss. Negative for fever.  HENT: Positive for congestion and hearing loss.   Respiratory: Negative.   Cardiovascular: Negative.  Negative for chest pain.  Gastrointestinal: Positive for nausea. Negative for abdominal pain, blood in stool, constipation, diarrhea, melena and vomiting.  Genitourinary: Negative.   Musculoskeletal: Positive for back pain, joint pain and neck pain.  Neurological: Positive  for headaches. Negative for weakness.  Psychiatric/Behavioral: Positive for memory loss. The patient is nervous/anxious.     As per HPI. Otherwise, a complete review of systems is negative.  PAST MEDICAL HISTORY: Past Medical History:  Diagnosis Date  . Allergy   . Anxiety    panic attacks- on occas.   . Arthritis    osteoporosis - especially hips.   . Asthma   . Confusion   . Dementia   . Depression   . Diverticulosis   . ETOH abuse   . Falls   . GERD (gastroesophageal reflux disease)   . Hyperlipidemia   . Hypertension   . Hyponatremia 07/05/2015  . Memory loss   . Normal 24 hour ambulatory pH monitoring study    good acid suppression  . Observed seizure-like activity ([place]) 07/05/2015   observed by husband , Dr Doy Mince  . Shortness of breath   . Stroke [facility])     PAST SURGICAL HISTORY: Past Surgical History:  Procedure Laterality Date  . ABDOMINAL HYSTERECTOMY    . BREAST SURGERY     3x- biposies   . COLONOSCOPY WITH PROPOFOL N/A 08/05/2015   Procedure: COLONOSCOPY WITH PROPOFOL;  Surgeon: [name], MD;  Location: [facility];  Service: Endoscopy;  Laterality: N/A;  . DOBUTAMINE STRESS ECHO  07/09/2001   normal  . ESOPHAGOGASTRODUODENOSCOPY  06/11/2006   normal  . ESOPHAGOGASTRODUODENOSCOPY (EGD) WITH PROPOFOL N/A 08/05/2015   Procedure: ESOPHAGOGASTRODUODENOSCOPY (EGD) WITH PROPOFOL;  Surgeon: [name], MD;  Location: [facility];  Service: Endoscopy;  Laterality: N/A;  . EYE SURGERY     cataracts removed, IOL- both eyes   . JOINT REPLACEMENT     bilateral hips  . LOOP RECORDER IMPLANT N/A 10/14/2014   Procedure: LOOP  RECORDER IMPLANT;  Surgeon: Evans Lance, MD;  Location: [facility] CATH LAB;  Service: Cardiovascular;  Laterality: N/A;  . NASAL SINUS SURGERY  1990  . REVERSE SHOULDER ARTHROPLASTY Right 08/13/2014   Procedure: REVERSE SHOULDER ARTHROPLASTY;  Surgeon: Marin Shutter, MD;  Location: [place];  Service: Orthopedics;  Laterality: Right;   interscalene block  . SHOULDER ARTHROSCOPY Left   . TEE WITHOUT CARDIOVERSION N/A 10/14/2014   Procedure: TRANSESOPHAGEAL ECHOCARDIOGRAM (TEE);  Surgeon: Sueanne Margarita, MD;  Location: [place];  Service: Cardiovascular;  Laterality: N/A;  . TOTAL HIP ARTHROPLASTY     left 1991/right 2008  . ULNAR NERVE TRANSPOSITION Right 08/01/2016   Procedure: RIGHT ULNAR NEUROPLASTY AT THE ELBOW;  Surgeon: Milly Jakob, MD;  Location: [place];  Service: Orthopedics;  Laterality: Right;    FAMILY HISTORY Family History  Problem Relation Age of Onset  . Heart block Mother   . Stroke Mother   . Heart attack Father   . Sudden death Brother   . Heart attack Brother   . Hypertension Brother   . Esophageal cancer Brother   . Stroke Brother        ADVANCED DIRECTIVES:    HEALTH MAINTENANCE: Social History  Substance Use Topics  . Smoking status: Never Smoker  . Smokeless tobacco: Never Used  . Alcohol use No     Allergies  Allergen Reactions  . Tramadol Other (See Comments)    Would avoid.  History of seizure on medication  . Latex Rash  . Penicillins Rash    .Marland KitchenHas patient had a PCN reaction causing immediate rash, facial/tongue/throat swelling, SOB or lightheadedness with hypotension: No Has patient had a PCN reaction causing severe rash involving mucus membranes or skin necrosis: No Has patient had a PCN reaction that required hospitalization No Has patient had a PCN reaction occurring within the last 10 years: No If all of the above answers are "NO", then may proceed with Cephalosporin use.      Current Outpatient Prescriptions  Medication Sig Dispense Refill  . acetaminophen (TYLENOL) 650 MG CR tablet Take 650 mg by mouth.    Marland Kitchen amLODipine (NORVASC) 2.5 MG tablet TAKE 1 TABLET BY MOUTH DAILY 30 tablet 5  . atorvastatin (LIPITOR) 10 MG tablet TAKE 1 TABLET BY MOUTH DAILY 90 tablet 1  . budesonide-formoterol (SYMBICORT) 160-4.5 MCG/ACT inhaler INHALE 2  PUFFS INTO THE LUNGS 2 (TWO) TIMES DAILY AS NEEDED (SHORTNESS OF BREATH).    . citalopram (CELEXA) 20 MG tablet TAKE 1 TABLET BY MOUTH EVERY DAY 90 tablet 1  . donepezil (ARICEPT) 10 MG tablet TAKE 1 TABLET BY MOUTH DAILY AT BEDTIME 90 tablet 1  . doxepin (SINEQUAN) 25 MG capsule TAKE 1 CAPSULE BY MOUTH DAILY AT BEDTIME  5  . fentaNYL (DURAGESIC - DOSED MCG/HR) 25 MCG/HR patch Place 25 mcg onto the skin every 3 (three) days.    . fluticasone (FLONASE) 50 MCG/ACT nasal spray Place into the nose.    . lansoprazole (PREVACID) 15 MG capsule TAKE ONE CAPSULE BY MOUTH EVERY DAY 90 capsule 1  . LYRICA 75 MG capsule     . Multiple Vitamins-Minerals (EYE VITAMINS) CAPS Take 1 capsule by mouth daily.    . mupirocin ointment (BACTROBAN) 2 % Apply to affected area 2 times daily. 22 g 0  . naproxen sodium (ANAPROX) 220 MG tablet Take 220 mg by mouth.    . Omega-3 Fatty Acids (FISH OIL PO) Take by mouth.    Marland Kitchen  ondansetron (ZOFRAN) 8 MG tablet Take 8 mg by mouth every 8 (eight) hours as needed.     Marland Kitchen oxyCODONE-acetaminophen (PERCOCET) 7.5-325 MG tablet Take 1 tablet by mouth 2 (two) times daily.  0  . polyvinyl alcohol (LIQUIFILM TEARS) 1.4 % ophthalmic solution Place 1 drop into both eyes as needed.     . pregabalin (LYRICA) 50 MG capsule Take 50 mg by mouth daily.     . vancomycin (VANCOCIN) 50 mg/mL oral solution 125 mg 4 times daily for 2 weeks, 3 times daily for 2 weeks, 2 times daily for 2 weeks, 1 time daily for 1 week, then every other day for 1 week 350 mL 0  . vitamin E 400 UNIT capsule Take by mouth.    . Vitamins/Minerals TABS Take by mouth.    . clopidogrel (PLAVIX) 75 MG tablet TAKE 1 TABLET (75 MG TOTAL) BY MOUTH DAILY.    . montelukast (SINGULAIR) 10 MG tablet Take 10 mg by mouth.    . promethazine (PHENERGAN) 25 MG tablet Take 25 mg by mouth.     No current facility-administered medications for this visit.     OBJECTIVE: Vitals:   11/09/16 1417  BP: 113/76  Pulse: 93  Resp: 20  Temp:  98.1 F (36.7 C)     Body mass index is 25.49 kg/m.    ECOG FS:2 - Symptomatic, <50% confined to bed  General: Very pale, well-developed, well-nourished, no acute distress. Eyes: Pink conjunctiva, anicteric sclera, puffiness under bilateral eyes Lungs: Diminished breath sounds bilaterally. Heart: Regular rate and rhythm. No rubs, murmurs, or gallops. Abdomen: Soft, nontender, nondistended. No organomegaly noted, normoactive bowel sounds. Musculoskeletal: No edema, cyanosis, or clubbing. Neuro: Alert, occasionally unable to follow conversation, slow responses. Cranial nerves grossly intact. Skin: No rashes or petechiae noted. Psych: Normal affect. Short-term memory loss.   LAB RESULTS:  Lab Results  Component Value Date   NA 129 (L) 05/19/2016   K 4.3 05/19/2016   CL 97 05/19/2016   CO2 27 05/19/2016   GLUCOSE 91 05/19/2016   BUN 17 05/19/2016   CREATININE 0.91 05/19/2016   CALCIUM 9.7 05/19/2016   PROT 6.4 05/19/2016   ALBUMIN 3.6 05/19/2016   AST 22 05/19/2016   ALT 20 05/19/2016   ALKPHOS 71 05/19/2016   BILITOT 0.2 05/19/2016   GFRNONAA 53 (L) 07/08/2015   GFRAA >60 07/08/2015    Lab Results  Component Value Date   WBC 11.8 (H) 11/09/2016   NEUTROABS 7.8 (H) 11/09/2016   HGB 12.9 11/09/2016   HCT 39.3 11/09/2016   MCV 81.5 11/09/2016   PLT 605 (H) 11/09/2016   Lab Results  Component Value Date   IRON 57 11/09/2016   TIBC 236 (L) 11/09/2016   IRONPCTSAT 24 11/09/2016    Lab Results  Component Value Date   FERRITIN 80 11/09/2016     STUDIES: No results found.  ASSESSMENT: Leukocytosis, iron deficiency anemia, thrombocytosis.  PLAN:    1. Iron deficiency anemia: Patient's hemoglobin and iron stores are within normal limits today and she will not need any IV iron today. Previously, the remainder of her laboratory work was also negative or within normal limits. Colonoscopy and EGD in October 2016 were unrevealing. Return to clinic in 3 months with  repeat laboratory work and further evaluation.  2. Leukocytosis: Mildly elevated today. Peripheral blood flow cytometry as well as BCR-ABL mutation are negative. Monitor. 3. Thrombocytosis: Platelets 605 today. JAK-2 mutation negative. Monitor. 4. Hypertension: Resolved. Patient's blood pressure  is normal today. Continue current medications. 5. Pain: Continue treatment per primary care and pain clinic. 6. Shortness of breath: Continue treatment per primary care.  Patient expressed understanding and was in agreement with this plan. She also understands that She can call clinic at any time with any questions, concerns, or complaints.    Lucendia Herrlich, NP  11/09/16 4:16 PM   Patient was seen and evaluated independently and I agree with the assessment and plan as dictated above.  Lloyd Huger, MD 11/12/16 9:53 PM

## 2016-11-09 ENCOUNTER — Inpatient Hospital Stay: Payer: Medicare Other

## 2016-11-09 ENCOUNTER — Inpatient Hospital Stay: Payer: Medicare Other | Attending: Oncology | Admitting: Oncology

## 2016-11-09 VITALS — BP 113/76 | HR 93 | Temp 98.1°F | Resp 20 | Wt 148.5 lb

## 2016-11-09 DIAGNOSIS — Z8673 Personal history of transient ischemic attack (TIA), and cerebral infarction without residual deficits: Secondary | ICD-10-CM | POA: Insufficient documentation

## 2016-11-09 DIAGNOSIS — Z79899 Other long term (current) drug therapy: Secondary | ICD-10-CM | POA: Insufficient documentation

## 2016-11-09 DIAGNOSIS — R634 Abnormal weight loss: Secondary | ICD-10-CM | POA: Diagnosis not present

## 2016-11-09 DIAGNOSIS — Z880 Allergy status to penicillin: Secondary | ICD-10-CM | POA: Insufficient documentation

## 2016-11-09 DIAGNOSIS — M542 Cervicalgia: Secondary | ICD-10-CM | POA: Diagnosis not present

## 2016-11-09 DIAGNOSIS — D72829 Elevated white blood cell count, unspecified: Secondary | ICD-10-CM | POA: Diagnosis not present

## 2016-11-09 DIAGNOSIS — R5382 Chronic fatigue, unspecified: Secondary | ICD-10-CM | POA: Insufficient documentation

## 2016-11-09 DIAGNOSIS — E785 Hyperlipidemia, unspecified: Secondary | ICD-10-CM | POA: Diagnosis not present

## 2016-11-09 DIAGNOSIS — D473 Essential (hemorrhagic) thrombocythemia: Secondary | ICD-10-CM | POA: Insufficient documentation

## 2016-11-09 DIAGNOSIS — H9190 Unspecified hearing loss, unspecified ear: Secondary | ICD-10-CM | POA: Diagnosis not present

## 2016-11-09 DIAGNOSIS — G8929 Other chronic pain: Secondary | ICD-10-CM

## 2016-11-09 DIAGNOSIS — M549 Dorsalgia, unspecified: Secondary | ICD-10-CM | POA: Diagnosis not present

## 2016-11-09 DIAGNOSIS — I10 Essential (primary) hypertension: Secondary | ICD-10-CM | POA: Diagnosis not present

## 2016-11-09 DIAGNOSIS — R5383 Other fatigue: Secondary | ICD-10-CM | POA: Diagnosis not present

## 2016-11-09 DIAGNOSIS — D509 Iron deficiency anemia, unspecified: Secondary | ICD-10-CM

## 2016-11-09 DIAGNOSIS — R0602 Shortness of breath: Secondary | ICD-10-CM | POA: Diagnosis not present

## 2016-11-09 DIAGNOSIS — D75839 Thrombocytosis, unspecified: Secondary | ICD-10-CM

## 2016-11-09 DIAGNOSIS — R51 Headache: Secondary | ICD-10-CM | POA: Diagnosis not present

## 2016-11-09 DIAGNOSIS — D508 Other iron deficiency anemias: Secondary | ICD-10-CM

## 2016-11-09 DIAGNOSIS — R5381 Other malaise: Secondary | ICD-10-CM | POA: Diagnosis not present

## 2016-11-09 DIAGNOSIS — J45909 Unspecified asthma, uncomplicated: Secondary | ICD-10-CM | POA: Diagnosis not present

## 2016-11-09 DIAGNOSIS — R413 Other amnesia: Secondary | ICD-10-CM | POA: Diagnosis not present

## 2016-11-09 DIAGNOSIS — K219 Gastro-esophageal reflux disease without esophagitis: Secondary | ICD-10-CM

## 2016-11-09 DIAGNOSIS — F419 Anxiety disorder, unspecified: Secondary | ICD-10-CM | POA: Diagnosis not present

## 2016-11-09 DIAGNOSIS — D72828 Other elevated white blood cell count: Secondary | ICD-10-CM

## 2016-11-09 DIAGNOSIS — R110 Nausea: Secondary | ICD-10-CM | POA: Diagnosis not present

## 2016-11-09 LAB — CBC WITH DIFFERENTIAL/PLATELET
BASOS ABS: 0.1 10*3/uL (ref 0–0.1)
BASOS PCT: 1 %
EOS ABS: 1.3 10*3/uL — AB (ref 0–0.7)
Eosinophils Relative: 11 %
HEMATOCRIT: 39.3 % (ref 35.0–47.0)
HEMOGLOBIN: 12.9 g/dL (ref 12.0–16.0)
Lymphocytes Relative: 17 %
Lymphs Abs: 2 10*3/uL (ref 1.0–3.6)
MCH: 26.7 pg (ref 26.0–34.0)
MCHC: 32.8 g/dL (ref 32.0–36.0)
MCV: 81.5 fL (ref 80.0–100.0)
Monocytes Absolute: 0.5 10*3/uL (ref 0.2–0.9)
Monocytes Relative: 5 %
NEUTROS ABS: 7.8 10*3/uL — AB (ref 1.4–6.5)
NEUTROS PCT: 66 %
Platelets: 605 10*3/uL — ABNORMAL HIGH (ref 150–440)
RBC: 4.83 MIL/uL (ref 3.80–5.20)
RDW: 16.5 % — ABNORMAL HIGH (ref 11.5–14.5)
WBC: 11.8 10*3/uL — AB (ref 3.6–11.0)

## 2016-11-09 LAB — IRON AND TIBC
IRON: 57 ug/dL (ref 28–170)
SATURATION RATIOS: 24 % (ref 10.4–31.8)
TIBC: 236 ug/dL — AB (ref 250–450)
UIBC: 179 ug/dL

## 2016-11-09 LAB — FERRITIN: FERRITIN: 80 ng/mL (ref 11–307)

## 2016-11-09 NOTE — Progress Notes (Signed)
Patient is here for follow up  

## 2016-11-10 ENCOUNTER — Ambulatory Visit (INDEPENDENT_AMBULATORY_CARE_PROVIDER_SITE_OTHER): Payer: Medicare Other | Admitting: Gastroenterology

## 2016-11-10 ENCOUNTER — Encounter: Payer: Self-pay | Admitting: Gastroenterology

## 2016-11-10 VITALS — BP 100/60 | HR 77 | Ht 64.4 in | Wt 148.8 lb

## 2016-11-10 DIAGNOSIS — I639 Cerebral infarction, unspecified: Secondary | ICD-10-CM

## 2016-11-10 DIAGNOSIS — R197 Diarrhea, unspecified: Secondary | ICD-10-CM | POA: Diagnosis not present

## 2016-11-10 NOTE — Patient Instructions (Signed)
Continue imodium PRN. Call if any new Gi troubles.

## 2016-11-10 NOTE — Progress Notes (Signed)
Review of pertinent gastrointestinal problems: 1. FOBT positive stool, 06/2015 when in hosp 2. Diarrhea: started 06/2015: labs 07/2015:C. Diff + by PCR, routine stool culture neg, ova and parasites neg; CBC with slightly elevated WBC, not anemic. Was started on flagyl 500mg  tid on 10/5.Colonoscopy Dr. Ardis Hughs 10/2016found several small polyps (not removed due to comorbidities), otherwise normal including normal terminal ileum.. Random biopsied were normal. C diff again + by PCR 10/2015, treated with long tapering vanc course.  C. Diff + 04/2016, long tapering vanc course again.  Ciffi + again 08/2016 with diarrhea recurernce, responded to long tapering vanc course again 3. Nausea/vomiting 2016/2017:EGD 10/2016Dr. Ardis Hughs was normal. Chronic narcotics as well as some of her other daily meds may contribute (tramadol #2 SE, aricept #1 SE).    HPI: This is a  very pleasant 71 year old woman who is here with her husband again today.  Chief complaint is abnormal bowels, intermittent diarrhea  She is mildly demented. Her history giving is not completely reliable.  Her husband provides most of her history  She had some dietary related diarrhea 3 or 4 weeks ago. This was dramatic for 1-2 days with a lot of loose stools however it resolved without any need for antibiotics. She has been taking Imodium 2 pills in the morning recently. She has not even needed that for the past 3 or 4 days. Should she had a constipated stool today.  She has no nausea or vomiting. She is eating well  ROS: complete GI ROS as described in HPI.  Constitutional:  No unintentional weight loss   Past Medical History:  Diagnosis Date  . Allergy   . Anxiety    panic attacks- on occas.   . Arthritis    osteoporosis - especially hips.   . Asthma   . Confusion   . Dementia   . Depression   . Diverticulosis   . ETOH abuse   . Falls   . GERD (gastroesophageal reflux disease)   . Hyperlipidemia   . Hypertension   .  Hyponatremia 07/05/2015  . Memory loss   . Normal 24 hour ambulatory pH monitoring study    good acid suppression  . Observed seizure-like activity ([place]) 07/05/2015   observed by husband , Dr Doy Mince  . Shortness of breath   . Stroke [facility])     Past Surgical History:  Procedure Laterality Date  . ABDOMINAL HYSTERECTOMY    . BREAST SURGERY     3x- biposies   . COLONOSCOPY WITH PROPOFOL N/A 08/05/2015   Procedure: COLONOSCOPY WITH PROPOFOL;  Surgeon: [name], MD;  [facility];  Service: Endoscopy;  Laterality: N/A;  . DOBUTAMINE STRESS ECHO  07/09/2001   normal  . ESOPHAGOGASTRODUODENOSCOPY  06/11/2006   normal  . ESOPHAGOGASTRODUODENOSCOPY (EGD) WITH PROPOFOL N/A 08/05/2015   Procedure: ESOPHAGOGASTRODUODENOSCOPY (EGD) WITH PROPOFOL;  Surgeon: [name], MD;  [facility];  Service: Endoscopy;  Laterality: N/A;  . EYE SURGERY     cataracts removed, IOL- both eyes   . JOINT REPLACEMENT     bilateral hips  . LOOP RECORDER IMPLANT N/A 10/14/2014   Procedure: LOOP RECORDER IMPLANT;  Surgeon: Evans Lance, MD;  Location: [facility] CATH LAB;  Service: Cardiovascular;  Laterality: N/A;  . NASAL SINUS SURGERY  1990  . REVERSE SHOULDER ARTHROPLASTY Right 08/13/2014   Procedure: REVERSE SHOULDER ARTHROPLASTY;  Surgeon: Marin Shutter, MD;  Location: [place];  Service: Orthopedics;  Laterality: Right;  interscalene block  . SHOULDER ARTHROSCOPY  Left   . TEE WITHOUT CARDIOVERSION N/A 10/14/2014   Procedure: TRANSESOPHAGEAL ECHOCARDIOGRAM (TEE);  Surgeon: Sueanne Margarita, MD;  Location: [place];  Service: Cardiovascular;  Laterality: N/A;  . TOTAL HIP ARTHROPLASTY     left 1991/right 2008  . ULNAR NERVE TRANSPOSITION Right 08/01/2016   Procedure: RIGHT ULNAR NEUROPLASTY AT THE ELBOW;  Surgeon: Milly Jakob, MD;  Location: [place];  Service: Orthopedics;  Laterality: Right;    Current Outpatient Prescriptions  Medication Sig Dispense Refill   . acetaminophen (TYLENOL) 650 MG CR tablet Take 650 mg by mouth.    Marland Kitchen amLODipine (NORVASC) 2.5 MG tablet TAKE 1 TABLET BY MOUTH DAILY 30 tablet 5  . atorvastatin (LIPITOR) 10 MG tablet TAKE 1 TABLET BY MOUTH DAILY 90 tablet 1  . budesonide-formoterol (SYMBICORT) 160-4.5 MCG/ACT inhaler INHALE 2 PUFFS INTO THE LUNGS 2 (TWO) TIMES DAILY AS NEEDED (SHORTNESS OF BREATH).    . citalopram (CELEXA) 20 MG tablet TAKE 1 TABLET BY MOUTH EVERY DAY 90 tablet 1  . clopidogrel (PLAVIX) 75 MG tablet TAKE 1 TABLET (75 MG TOTAL) BY MOUTH DAILY.    Marland Kitchen donepezil (ARICEPT) 10 MG tablet TAKE 1 TABLET BY MOUTH DAILY AT BEDTIME 90 tablet 1  . doxepin (SINEQUAN) 25 MG capsule TAKE 1 CAPSULE BY MOUTH DAILY AT BEDTIME  5  . fentaNYL (DURAGESIC - DOSED MCG/HR) 25 MCG/HR patch Place 25 mcg onto the skin every 3 (three) days.    . fluticasone (FLONASE) 50 MCG/ACT nasal spray Place into the nose.    . lansoprazole (PREVACID) 15 MG capsule TAKE ONE CAPSULE BY MOUTH EVERY DAY 90 capsule 1  . LYRICA 75 MG capsule     . montelukast (SINGULAIR) 10 MG tablet Take 10 mg by mouth.    . Multiple Vitamins-Minerals (EYE VITAMINS) CAPS Take 1 capsule by mouth daily.    . mupirocin ointment (BACTROBAN) 2 % Apply to affected area 2 times daily. 22 g 0  . naproxen sodium (ANAPROX) 220 MG tablet Take 220 mg by mouth.    . Omega-3 Fatty Acids (FISH OIL PO) Take by mouth.    . ondansetron (ZOFRAN) 8 MG tablet Take 8 mg by mouth every 8 (eight) hours as needed.     Marland Kitchen oxyCODONE-acetaminophen (PERCOCET) 7.5-325 MG tablet Take 1 tablet by mouth 2 (two) times daily.  0  . polyvinyl alcohol (LIQUIFILM TEARS) 1.4 % ophthalmic solution Place 1 drop into both eyes as needed.     . pregabalin (LYRICA) 50 MG capsule Take 50 mg by mouth daily.     . promethazine (PHENERGAN) 25 MG tablet Take 25 mg by mouth.    . vancomycin (VANCOCIN) 50 mg/mL oral solution 125 mg 4 times daily for 2 weeks, 3 times daily for 2 weeks, 2 times daily for 2 weeks, 1 time  daily for 1 week, then every other day for 1 week 350 mL 0  . vitamin E 400 UNIT capsule Take by mouth.    . Vitamins/Minerals TABS Take by mouth.     No current facility-administered medications for this visit.     Allergies as of 11/10/2016 - Review Complete 11/10/2016  Allergen Reaction Noted  . Tramadol Other (See Comments) 09/07/2015  . Latex Rash 07/25/2007  . Penicillins Rash 07/25/2007    Family History  Problem Relation Age of Onset  . Heart block Mother   . Stroke Mother   . Heart attack Father   . Sudden death Brother   . Heart  attack Brother   . Hypertension Brother   . Esophageal cancer Brother   . Stroke Brother     Social History   Social History  . Marital status: Married    Spouse name: N/A  . Number of children: N/A  . Years of education: N/A   Occupational History  . retired Retired    [employer]   Social History Main Topics  . Smoking status: Never Smoker  . Smokeless tobacco: Never Used  . Alcohol use No  . Drug use: No  . Sexual activity: Yes   Other Topics Concern  . Not on file   Social History Narrative   Married. Retired.       Physical Exam: BP 100/60   Pulse 77   Ht 5' 4.4" (1.636 m)   Wt 148 lb 12.8 oz (67.5 kg)   SpO2 96%   BMI 25.23 kg/m  Constitutional: generally well-appearing Psychiatric: alert and oriented x3 Abdomen: soft, nontender, nondistended, no obvious ascites, no peritoneal signs, normal bowel sounds No peripheral edema noted in lower extremities  Assessment and plan: 71 y.o. female with History of Clostridium difficile recurrent  Currently feeling well. I see no need for any tests or imaging studies currently. She will use Imodium on an as-needed basis and she will call she has significant high volume diarrhea again.  Please see the "Patient Instructions" section for addition details about the plan.  Owens Loffler, MD [facility] 11/10/2016, 1:28 PM

## 2016-11-13 ENCOUNTER — Encounter: Payer: Self-pay | Admitting: Family Medicine

## 2016-11-13 ENCOUNTER — Ambulatory Visit (INDEPENDENT_AMBULATORY_CARE_PROVIDER_SITE_OTHER): Payer: Medicare Other | Admitting: Family Medicine

## 2016-11-13 VITALS — BP 100/60 | HR 74 | Temp 97.6°F | Wt 149.5 lb

## 2016-11-13 DIAGNOSIS — I639 Cerebral infarction, unspecified: Secondary | ICD-10-CM

## 2016-11-13 DIAGNOSIS — M25519 Pain in unspecified shoulder: Secondary | ICD-10-CM | POA: Diagnosis not present

## 2016-11-13 DIAGNOSIS — R51 Headache: Secondary | ICD-10-CM

## 2016-11-13 DIAGNOSIS — R519 Headache, unspecified: Secondary | ICD-10-CM

## 2016-11-13 MED ORDER — IBUPROFEN 200 MG PO TABS: 400.0000 mg | ORAL_TABLET | Freq: Three times a day (TID) | ORAL | Status: DC | PRN

## 2016-11-13 MED ORDER — ACETAMINOPHEN 325 MG PO TABS: 650.0000 mg | ORAL_TABLET | Freq: Three times a day (TID) | ORAL | Status: DC | PRN

## 2016-11-13 NOTE — Progress Notes (Signed)
Pre visit review using our clinic review tool, if applicable. No additional management support is needed unless otherwise documented below in the visit note. 

## 2016-11-13 NOTE — Patient Instructions (Addendum)
You could take take 2 ibuprofen and 2 tylenol at a time, up to 3 times a day with food.   Don't take ibuprofen and aleve together.  I'll check with the pain clinic.  Stop the BCs in the meantime.   See if laying down in a dark room helps some with the headaches.  Take care.  Glad to see you.

## 2016-11-13 NOTE — Progress Notes (Signed)
Still with sig pain with weather changes.    Seeing preferred pain mgmt, Dr. Andree Elk.    Pain worst along B medial anterior shoulders.    Now with HAs (longstanding per patient and husband).  She gets irritable with inc in pain.  Pain can be in one eye or the other, or can be on the forehead.  No posterior HA/pain.  Some better with BCs.  No vision changes.  Throbbing pain.  No photophobia.   Recent hgb 12.9.    Palliative care came out last week, d/w them about tylenol and ibuprofen combination.  D/w pt and husband about possible dosing and cautions about tylenol use with combination of meds.  See AVS.    D/w pt about physical therapy re: her shoulders.  She is willing to try.    D/w pt about f/u with ortho.  I didn't discourage that; I'll defer to pt and husband.    Meds, vitals, and allergies reviewed.   ROS: Per HPI unless specifically indicated in ROS section   GEN: nad, alert HEENT: mucous membranes moist, nasal and OP exam wnl, sinuses not ttp x4 NECK: supple w/o LA, not ttp CV: rrr. PULM: ctab, no inc wob ABD: soft, +bs EXT: no edema SKIN: no acute rash

## 2016-11-14 DIAGNOSIS — R519 Headache, unspecified: Secondary | ICD-10-CM | POA: Insufficient documentation

## 2016-11-14 DIAGNOSIS — R51 Headache: Secondary | ICD-10-CM

## 2016-11-14 NOTE — Assessment & Plan Note (Addendum)
See AVS.  I'll ask for input from Dr. Andree Elk with the pain clinic.  Unclear if she is having rebound.  Stop BCs.  Unclear if sx are from DDD in neck. Has chronic pain at baseline.  Should be okay to use Tylenol along with ibuprofen. See after visit summary. I did not change her long-term premedications baseline. I did give her routine Tylenol and NSAID cautions.  >25 minutes spent in face to face time with patient, >50% spent in counselling or coordination of care.  Still okay to try PT.  See above.

## 2016-11-21 LAB — CUP PACEART REMOTE DEVICE CHECK
Implantable Pulse Generator Implant Date: 20151230
MDC IDC SESS DTM: 20171221051242

## 2016-11-23 ENCOUNTER — Other Ambulatory Visit: Payer: Self-pay | Admitting: Family Medicine

## 2016-11-27 ENCOUNTER — Ambulatory Visit: Payer: Medicare Other | Attending: Family Medicine | Admitting: Physical Therapy

## 2016-11-27 DIAGNOSIS — M25511 Pain in right shoulder: Secondary | ICD-10-CM | POA: Diagnosis not present

## 2016-11-27 DIAGNOSIS — M25512 Pain in left shoulder: Secondary | ICD-10-CM | POA: Insufficient documentation

## 2016-11-27 DIAGNOSIS — G8929 Other chronic pain: Secondary | ICD-10-CM | POA: Diagnosis not present

## 2016-11-28 NOTE — Therapy (Signed)
[facility] [address] [place], [address] Phone: [phone]   Fax:  [phone]  Physical Therapy Evaluation  Patient Details  Name: Aimee Jones MRN: [id number] Date of Birth: November 20, 1945 Referring Provider: [name] MD  Encounter Date: 11/27/2016      PT End of Session - 11/27/16 1614    Visit Number 1   Number of Visits 9   Date for PT Re-Evaluation 01/22/17   PT Start Time [id number]   PT Stop Time 1608   PT Time Calculation (min) 53 min   Activity Tolerance Patient tolerated treatment well   Behavior During Therapy [facility] for tasks assessed/performed      Past Medical History:  Diagnosis Date  . Allergy   . Anxiety    panic attacks- on occas.   . Arthritis    osteoporosis - especially hips.   . Asthma   . Confusion   . Dementia   . Depression   . Diverticulosis   . ETOH abuse   . Falls   . GERD (gastroesophageal reflux disease)   . Hyperlipidemia   . Hypertension   . Hyponatremia 07/05/2015  . Memory loss   . Normal 24 hour ambulatory pH monitoring study    good acid suppression  . Observed seizure-like activity ([place]) 07/05/2015   observed by husband , Dr Doy Mince  . Shortness of breath   . Stroke [facility])     Past Surgical History:  Procedure Laterality Date  . ABDOMINAL HYSTERECTOMY    . BREAST SURGERY     3x- biposies   . COLONOSCOPY WITH PROPOFOL N/A 08/05/2015   Procedure: COLONOSCOPY WITH PROPOFOL;  Surgeon: [name], MD;  Location: [facility];  Service: Endoscopy;  Laterality: N/A;  . DOBUTAMINE STRESS ECHO  07/09/2001   normal  . ESOPHAGOGASTRODUODENOSCOPY  06/11/2006   normal  . ESOPHAGOGASTRODUODENOSCOPY (EGD) WITH PROPOFOL N/A 08/05/2015   Procedure: ESOPHAGOGASTRODUODENOSCOPY (EGD) WITH PROPOFOL;  Surgeon: [name], MD;  [facility];  Service: Endoscopy;  Laterality: N/A;  . EYE SURGERY     cataracts removed, IOL- both eyes   . JOINT  REPLACEMENT     bilateral hips  . LOOP RECORDER IMPLANT N/A 10/14/2014   Procedure: LOOP RECORDER IMPLANT;  Surgeon: Evans Lance, MD;  Location: [facility] CATH LAB;  Service: Cardiovascular;  Laterality: N/A;  . NASAL SINUS SURGERY  1990  . REVERSE SHOULDER ARTHROPLASTY Right 08/13/2014   Procedure: REVERSE SHOULDER ARTHROPLASTY;  Surgeon: Marin Shutter, MD;  Location: [place];  Service: Orthopedics;  Laterality: Right;  interscalene block  . SHOULDER ARTHROSCOPY Left   . TEE WITHOUT CARDIOVERSION N/A 10/14/2014   Procedure: TRANSESOPHAGEAL ECHOCARDIOGRAM (TEE);  Surgeon: Sueanne Margarita, MD;  Location: [place];  Service: Cardiovascular;  Laterality: N/A;  . TOTAL HIP ARTHROPLASTY     left 1991/right 2008  . ULNAR NERVE TRANSPOSITION Right 08/01/2016   Procedure: RIGHT ULNAR NEUROPLASTY AT THE ELBOW;  Surgeon: Milly Jakob, MD;  Location: [place];  Service: Orthopedics;  Laterality: Right;    There were no vitals filed for this visit.       Subjective Assessment - 11/27/16 1624    Subjective Majority of this history is provided by the patient's husband as patient has short term memory loss. Patient has had chronic bilateral shoulder pain (around pectoral insertion), she has a history of R reverse total shoulder and L arthroscopic surgeries. She has most recently had  injections around her neck, which did not appear to reduce her symptoms. She is not currently cooking/cleaning or performing ADLs. She currently has only been using pain medications to manage her symptoms. No change in bowel/bladder, visual changes, speech, or any difficulty in swallowing.    Patient is accompained by: Family member  Husband    Limitations Lifting;House hold activities   Diagnostic tests Old arthroscopic L shoulder sx, R reverse total shoulder sx.    Patient Stated Goals To have less pain    Currently in Pain? Yes   Pain Score 5    Pain Location Shoulder   Pain Orientation Left   Pain  Descriptors / Indicators Aching   Pain Type Chronic pain   Pain Onset More than a month ago   Pain Frequency Intermittent   Aggravating Factors  Appears to increase with exercise/movement.             [facility] PT Assessment - 11/27/16 1628      Assessment   Medical Diagnosis Shoulder pain   Referring Provider [name] MD   Prior Therapy --  Medications, sx     Precautions   Precautions None     Restrictions   Weight Bearing Restrictions No     Balance Screen   Has the patient fallen in the past 6 months --  Family did not indicate falls, hx of falls however     [place] residence   Living Arrangements Spouse/significant other     Cognition   Overall Cognitive Status History of cognitive impairments - at baseline   Memory Impaired   Memory Impairment Decreased short term memory;Decreased long term memory;Decreased recall of new information;Storage deficit;Retrieval deficit   Awareness Appears intact     Observation/Other Assessments   Quick DASH  72.7     Sensation   Light Touch Appears Intact     Palpation   Palpation comment --  Pain around L pectoral insertion      L flexion - 150 degrees reports pain Abduction - 145 degrees (less pain) Passive External rotation - < 10 degrees initially   R flexion - 135 degrees  Abduction - 140 degrees   Only L cervical rotation was reported as painful, though this was inconsistent with where she felt pain.   Exercises/Manual Passive ER stretching x 15 minutes -- increased to roughly 50-60 degrees of motion by completion of session, educated husband on how to complete at home   Attempted to teach pec stretch in doorway, patient unable to properly follow commands or recall technique, thus dc'd.  Educated and observed patient with standing red t-band rows x 10 (low rows, cued to elbows to remain extended).                       PT Education - 11/27/16 1628     Education provided Yes   Education Details Stretching techniques, prognosis and time frame.    Person(s) Educated Spouse;Patient   Methods Explanation;Demonstration;Handout;Verbal cues   Comprehension Verbalized understanding;Returned demonstration;Verbal cues required;Need further instruction             PT Long Term Goals - 11/27/16 1621      PT LONG TERM GOAL #1   Title Patient will report a Quick Dash score of less than 60% to demonstrate improved tolerance for ADLs.    Baseline 72.7%   Time 8   Period Weeks   Status New  PT LONG TERM GOAL #2   Title Patient will demonstrate at least 155 degrees of active L shoulder flexion to demonstrate improved ability to complete ADLs.    Baseline 145 degrees    Time 8   Period Weeks   Status New     PT LONG TERM GOAL #3   Title Patient will report worst pain of 5/10 to demonstrate improved tolerance for ADLs.    Baseline 9/10   Time 8   Period Weeks   Status New               Plan - 11/27/16 1615    Clinical Impression Statement Patient reports history of chronic bilateral shoulder pain, L>R currently. She demonstrates significant tightness in anterior structures with minimal ER passively initially. She does have short term memory loss and is a poor historian, thus test-re-test methodology is almost impossible to accurately describe change in her symptoms. She does increase her passive ROM into ER throughout session, though has significant difficulty following instructions and commands given her memory deficits. She was provided with HEP for stretching to be assisted by her husband who was present. She will likely benefit from skilled PT services to address her L shoulder pain limiting her ability to complete ADLs.    Rehab Potential Fair   Clinical Impairments Affecting Rehab Potential Patient has short term memory loss and is unable to provide details on improved or worsened symptoms.    PT Frequency 1x / week   PT  Duration 8 weeks   PT Treatment/Interventions ADLs/Self Care Home Management;Cryotherapy;Ultrasound;Moist Heat;Neuromuscular re-education;Patient/family education;Therapeutic exercise;Therapeutic activities;Manual techniques;Taping   PT Next Visit Plan Re-assess external rotation ROM, flexion, progress posterior shoulder strengthening.    PT Home Exercise Plan Pec stretch, passive external rotation range of motion, low rows with red t-band    Consulted and Agree with Plan of Care Patient      Patient will benefit from skilled therapeutic intervention in order to improve the following deficits and impairments:  Pain, Impaired UE functional use, Decreased strength, Decreased range of motion  Visit Diagnosis: Chronic left shoulder pain - Plan: PT plan of care cert/re-cert  Chronic right shoulder pain - Plan: PT plan of care cert/re-cert      G-Codes - [id number]    Functional Assessment Tool Used Katina Dung, family report    Functional Limitation Carrying, moving and handling objects   Carrying, Moving and Handling Objects Current Status [phone]) At least 20 percent but less than 40 percent impaired, limited or restricted   Carrying, Moving and Handling Objects Goal Status [id number]) At least 1 percent but less than 20 percent impaired, limited or restricted       Problem List Patient Active Problem List   Diagnosis Date Noted  . Headache 11/14/2016  . Hearing loss 05/24/2016  . Enteritis due to Clostridium difficile 05/24/2016  . Leukocytosis 05/12/2016  . Thrombocytosis ([place]) 05/12/2016  . Cervical stenosis of spine 01/10/2016  . Loss of weight 12/06/2015  . Dysuria 10/14/2015  . Cerebrovascular accident (CVA) due to bilateral embolism of posterior cerebral arteries ([place]) 09/27/2015  . Chronic pain syndrome 09/27/2015  . Elevated blood pressure 07/07/2015  . Seizure-like activity ([place]) 07/04/2015  . Seizure ([place]) 07/04/2015  . Depression   . GI bleed 06/24/2015  . Frequent  falls   . Narcotic abuse   . Benzodiazepine abuse   . Alcohol abuse   . Alcohol use with alcohol-induced persisting dementia ([place])   . Generalized anxiety disorder   .  Hypokalemia   . Hypomagnesemia   . Anemia, iron deficiency   . Urinary retention   . Altered mental status 06/22/2015  . Fall 06/22/2015  . Iron deficiency anemia 06/03/2015  . Medicare annual wellness visit, subsequent 04/23/2015  . Advance care planning 04/23/2015  . Senile purpura ([place]) 04/23/2015  . Abnormal CBC 04/23/2015  . Sleep apnea 12/11/2014  . 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 Stroke ([place])   . Cerebral thrombosis with cerebral infarction ([place]) 10/13/2014  . HLD (hyperlipidemia)   . Facial droop 10/12/2014  . CKD (chronic kidney disease), stage III 10/12/2014  . S/P shoulder replacement 08/13/2014  . Postmenopausal HRT (hormone replacement therapy) 05/31/2014  . Hyponatremia 05/28/2014  . Shoulder pain, bilateral 10/25/2012  . Parkinsonism ([place]) 08/22/2012  . MEMORY LOSS 06/22/2010  . ALCOHOL ABUSE 03/08/2010  . 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 OBSTRUCTIVE CHRONIC BRONCHITIS WITH EXACERBATION 01/02/2008  . GLUCOSE INTOLERANCE 11/15/2007  . GERD 07/25/2007  . OSTEOARTHRITIS 07/25/2007  . PANIC ATTACK 05/01/2007  . Essential hypertension 05/01/2007  . ALLERGIC RHINITIS 05/01/2007  . ASTHMA 05/01/2007  . DIVERTICULOSIS, COLON 05/01/2007  . MENOPAUSAL DISORDER 05/01/2007  . FIBROMYALGIA 05/01/2007   Royce Macadamia PT, DPT, CSCS    11/28/2016, 1:29 PM  [facility] [address], [place], [address] Phone: [phone]   Fax:  [phone]  Name: DANNON DEMSKY MRN: [id number] Date of Birth: 01-01-1946

## 2016-11-29 NOTE — Progress Notes (Signed)
I agree with plan.  Thanks.  11/29/16 [name], MD.

## 2016-12-04 ENCOUNTER — Ambulatory Visit: Payer: Medicare Other | Admitting: Physical Therapy

## 2016-12-04 ENCOUNTER — Ambulatory Visit (INDEPENDENT_AMBULATORY_CARE_PROVIDER_SITE_OTHER): Payer: Medicare Other | Admitting: *Deleted

## 2016-12-04 DIAGNOSIS — G8929 Other chronic pain: Secondary | ICD-10-CM

## 2016-12-04 DIAGNOSIS — M25511 Pain in right shoulder: Secondary | ICD-10-CM | POA: Diagnosis not present

## 2016-12-04 DIAGNOSIS — I639 Cerebral infarction, unspecified: Secondary | ICD-10-CM | POA: Diagnosis not present

## 2016-12-04 DIAGNOSIS — M25512 Pain in left shoulder: Secondary | ICD-10-CM | POA: Diagnosis not present

## 2016-12-04 NOTE — Therapy (Signed)
[facility] [address] [place], [address] Phone: [phone]   Fax:  [phone]  Physical Therapy Treatment  Patient Details  Name: Aimee Jones MRN: [id number] Date of Birth: [birth year] Referring Provider: [name] MD  Encounter Date: 12/04/2016      PT End of Session - 12/04/16 1515    Visit Number 2   Number of Visits 9   Date for PT Re-Evaluation 01/22/17   PT Start Time [id number]   PT Stop Time 1509   PT Time Calculation (min) 40 min   Activity Tolerance Patient tolerated treatment well   Behavior During Therapy [facility] for tasks assessed/performed      Past Medical History:  Diagnosis Date  . Allergy   . Anxiety    panic attacks- on occas.   . Arthritis    osteoporosis - especially hips.   . Asthma   . Confusion   . Dementia   . Depression   . Diverticulosis   . ETOH abuse   . Falls   . GERD (gastroesophageal reflux disease)   . Hyperlipidemia   . Hypertension   . Hyponatremia 07/05/2015  . Memory loss   . Normal 24 hour ambulatory pH monitoring study    good acid suppression  . Observed seizure-like activity ([place]) 07/05/2015   observed by husband , Dr Doy Mince  . Shortness of breath   . Stroke [facility])     Past Surgical History:  Procedure Laterality Date  . ABDOMINAL HYSTERECTOMY    . BREAST SURGERY     3x- biposies   . COLONOSCOPY WITH PROPOFOL N/A 08/05/2015   Procedure: COLONOSCOPY WITH PROPOFOL;  Surgeon: [name], MD;  [facility];  Service: Endoscopy;  Laterality: N/A;  . DOBUTAMINE STRESS ECHO  07/09/2001   normal  . ESOPHAGOGASTRODUODENOSCOPY  06/11/2006   normal  . ESOPHAGOGASTRODUODENOSCOPY (EGD) WITH PROPOFOL N/A 08/05/2015   Procedure: ESOPHAGOGASTRODUODENOSCOPY (EGD) WITH PROPOFOL;  Surgeon: [name], MD;  [facility];  Service: Endoscopy;  Laterality: N/A;  . EYE SURGERY     cataracts removed, IOL- both eyes   . JOINT  REPLACEMENT     bilateral hips  . LOOP RECORDER IMPLANT N/A 10/14/2014   Procedure: LOOP RECORDER IMPLANT;  Surgeon: Evans Lance, MD;  Location: [facility] CATH LAB;  Service: Cardiovascular;  Laterality: N/A;  . NASAL SINUS SURGERY  1990  . REVERSE SHOULDER ARTHROPLASTY Right 08/13/2014   Procedure: REVERSE SHOULDER ARTHROPLASTY;  Surgeon: Marin Shutter, MD;  Location: [place];  Service: Orthopedics;  Laterality: Right;  interscalene block  . SHOULDER ARTHROSCOPY Left   . TEE WITHOUT CARDIOVERSION N/A 10/14/2014   Procedure: TRANSESOPHAGEAL ECHOCARDIOGRAM (TEE);  Surgeon: Sueanne Margarita, MD;  Location: [place];  Service: Cardiovascular;  Laterality: N/A;  . TOTAL HIP ARTHROPLASTY     left 1991/right 2008  . ULNAR NERVE TRANSPOSITION Right 08/01/2016   Procedure: RIGHT ULNAR NEUROPLASTY AT THE ELBOW;  Surgeon: Milly Jakob, MD;  Location: [place];  Service: Orthopedics;  Laterality: Right;    There were no vitals filed for this visit.      Subjective Assessment - 12/04/16 1440    Subjective Per patient and husband, they have been consistent with HEP and have noticed a reduction in symptoms.    Patient is accompained by: Family member  Husband    Limitations Lifting;House hold activities   Diagnostic tests Old arthroscopic L shoulder sx, R reverse  total shoulder sx.    Patient Stated Goals To have less pain    Currently in Pain? Yes  Patient reports moderate to severe anterior L shoulder pain with OH movements.    Pain Location Shoulder   Pain Orientation Left   Pain Descriptors / Indicators Aching   Pain Type Chronic pain   Pain Onset More than a month ago   Pain Frequency Intermittent   Aggravating Factors  OH movement.       L PROM ER x 10 minutes (notable improvement in ROM, able to achieve between 50-60 degrees on this date.   AROM flexion - on LUE to 165 degrees   Soft tissue mobilization to L anterior deltoid patient reports this has relieving  sensation for her, no palpable points of pain/tenderness noted.   Yellow t-band ER (Single arm only, progressed to bilateral UEs with PT guiding). 3 rounds with bilateral, 1 round unilateral on both RUE and LUE   Seated rows x12 @ 10# for 3 sets (no discomfort reported).                            PT Education - 12/04/16 1515    Education provided Yes   Education Details Informed husband that patient does not appear to have shoulder pain, he said he would monitor pain medication dosing for resolution.    Person(s) Educated Patient;Spouse   Methods Demonstration;Explanation;Verbal cues;Handout   Comprehension Returned demonstration;Verbalized understanding             PT Long Term Goals - 11/27/16 1621      PT LONG TERM GOAL #1   Title Patient will report a Quick Dash score of less than 60% to demonstrate improved tolerance for ADLs.    Baseline 72.7%   Time 8   Period Weeks   Status New     PT LONG TERM GOAL #2   Title Patient will demonstrate at least 155 degrees of active L shoulder flexion to demonstrate improved ability to complete ADLs.    Baseline 145 degrees    Time 8   Period Weeks   Status New     PT LONG TERM GOAL #3   Title Patient will report worst pain of 5/10 to demonstrate improved tolerance for ADLs.    Baseline 9/10   Time 8   Period Weeks   Status New               Plan - 12/04/16 1512    Clinical Impression Statement Patient reports that she still has L shoulder pain anteriorly but reports it has decreased from what it was. Patient is a poor historian with short term memory loss so it is unclear if she truly has pain or remembers she does. She does not "look" like she has pain in OH movements as she can hold her hand above her head for several seconds and complete "[employer]" hand movements smoothly without UE deviations or cringing of the face. Her AROM into flexion has increased to ~ 161 degrees and appears symmetrical to R  side.    Rehab Potential Fair   Clinical Impairments Affecting Rehab Potential Patient has short term memory loss and is unable to provide details on improved or worsened symptoms.    PT Frequency 1x / week   PT Duration 8 weeks   PT Treatment/Interventions ADLs/Self Care Home Management;Cryotherapy;Ultrasound;Moist Heat;Neuromuscular re-education;Patient/family education;Therapeutic exercise;Therapeutic activities;Manual techniques;Taping   PT Next Visit Plan Re-assess  external rotation ROM, flexion, progress posterior shoulder strengthening.    PT Home Exercise Plan Pec stretch, passive external rotation range of motion, low rows with red t-band    Consulted and Agree with Plan of Care Patient      Patient will benefit from skilled therapeutic intervention in order to improve the following deficits and impairments:  Pain, Impaired UE functional use, Decreased strength, Decreased range of motion  Visit Diagnosis: Chronic left shoulder pain  Chronic right shoulder pain     Problem List Patient Active Problem List   Diagnosis Date Noted  . Headache 11/14/2016  . Hearing loss 05/24/2016  . Enteritis due to Clostridium difficile 05/24/2016  . Leukocytosis 05/12/2016  . Thrombocytosis ([place]) 05/12/2016  . Cervical stenosis of spine 01/10/2016  . Loss of weight 12/06/2015  . Dysuria 10/14/2015  . Cerebrovascular accident (CVA) due to bilateral embolism of posterior cerebral arteries ([place]) 09/27/2015  . Chronic pain syndrome 09/27/2015  . Elevated blood pressure 07/07/2015  . Seizure-like activity ([place]) 07/04/2015  . Seizure ([place]) 07/04/2015  . Depression   . GI bleed 06/24/2015  . Frequent falls   . Narcotic abuse   . Benzodiazepine abuse   . Alcohol abuse   . 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 Generalized anxiety disorder   . Hypokalemia   . Hypomagnesemia   . Anemia, iron deficiency   . Urinary retention   . Altered mental status 06/22/2015   . Fall 06/22/2015  . Iron deficiency anemia 06/03/2015  . Medicare annual wellness visit, subsequent 04/23/2015  . Advance care planning 04/23/2015  . Senile purpura ([place]) 04/23/2015  . Abnormal CBC 04/23/2015  . Sleep apnea 12/11/2014  . 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 Stroke ([place])   . Cerebral thrombosis with cerebral infarction ([place]) 10/13/2014  . HLD (hyperlipidemia)   . Facial droop 10/12/2014  . CKD (chronic kidney disease), stage III 10/12/2014  . S/P shoulder replacement 08/13/2014  . Postmenopausal HRT (hormone replacement therapy) 05/31/2014  . Hyponatremia 05/28/2014  . Shoulder pain, bilateral 10/25/2012  . Parkinsonism ([place]) 08/22/2012  . MEMORY LOSS 06/22/2010  . ALCOHOL ABUSE 03/08/2010  . 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 OBSTRUCTIVE CHRONIC BRONCHITIS WITH EXACERBATION 01/02/2008  . GLUCOSE INTOLERANCE 11/15/2007  . GERD 07/25/2007  . OSTEOARTHRITIS 07/25/2007  . PANIC ATTACK 05/01/2007  . Essential hypertension 05/01/2007  . ALLERGIC RHINITIS 05/01/2007  . ASTHMA 05/01/2007  . DIVERTICULOSIS, COLON 05/01/2007  . MENOPAUSAL DISORDER 05/01/2007  . FIBROMYALGIA 05/01/2007   Royce Macadamia PT, DPT, CSCS    12/04/2016, 3:18 PM  [facility] [address] [place], [address] Phone: [phone]   Fax:  [phone]  Name: MAGGI GRAPER MRN: [id number] Date of Birth: 08-Mar-1946

## 2016-12-04 NOTE — Patient Instructions (Addendum)
L PROM ER   AROM flexion - on LUE to 165 degrees   Soft tissue mobilization to L anterior deltoid   Yellow t-band ER (Single arm only, progressed to bilateral UEs with PT guiding). 3 rounds with bilateral, 1 round unilateral on both RUE and LUE   Seated rows x12 @ 10# for 3 sets

## 2016-12-05 NOTE — Progress Notes (Signed)
[facility] Summary Report / Loop Recorder 

## 2016-12-06 DIAGNOSIS — Z79891 Long term (current) use of opiate analgesic: Secondary | ICD-10-CM | POA: Diagnosis not present

## 2016-12-06 DIAGNOSIS — M542 Cervicalgia: Secondary | ICD-10-CM | POA: Diagnosis not present

## 2016-12-06 DIAGNOSIS — M25519 Pain in unspecified shoulder: Secondary | ICD-10-CM | POA: Diagnosis not present

## 2016-12-06 DIAGNOSIS — G894 Chronic pain syndrome: Secondary | ICD-10-CM | POA: Diagnosis not present

## 2016-12-06 DIAGNOSIS — M47812 Spondylosis without myelopathy or radiculopathy, cervical region: Secondary | ICD-10-CM | POA: Diagnosis not present

## 2016-12-06 DIAGNOSIS — Z79899 Other long term (current) drug therapy: Secondary | ICD-10-CM | POA: Diagnosis not present

## 2016-12-07 ENCOUNTER — Encounter: Payer: Medicare Other | Admitting: Physical Therapy

## 2016-12-07 LAB — CUP PACEART REMOTE DEVICE CHECK
Date Time Interrogation Session: 20180120053828
Implantable Pulse Generator Implant Date: 20151230

## 2016-12-08 LAB — CUP PACEART REMOTE DEVICE CHECK
Date Time Interrogation Session: 20180219054013
Implantable Pulse Generator Implant Date: 20151230

## 2016-12-11 ENCOUNTER — Ambulatory Visit: Payer: Medicare Other | Admitting: Physical Therapy

## 2016-12-11 DIAGNOSIS — M25512 Pain in left shoulder: Secondary | ICD-10-CM | POA: Diagnosis not present

## 2016-12-11 DIAGNOSIS — M25511 Pain in right shoulder: Secondary | ICD-10-CM | POA: Diagnosis not present

## 2016-12-11 DIAGNOSIS — G8929 Other chronic pain: Secondary | ICD-10-CM

## 2016-12-11 NOTE — Therapy (Signed)
[facility] [address], [place], [address] Phone: [phone]   Fax:  [phone]  Physical Therapy Treatment  Patient Details  Name: Aimee Jones MRN: [id number] Date of Birth: 1946/06/04 Referring Provider: [name] MD  Encounter Date: 12/11/2016      PT End of Session - 12/11/16 1515    Visit Number 3   Number of Visits 9   Date for PT Re-Evaluation 01/22/17   PT Start Time [id number]   PT Stop Time 1513   PT Time Calculation (min) 40 min   Activity Tolerance Patient tolerated treatment well   Behavior During Therapy [facility] for tasks assessed/performed      Past Medical History:  Diagnosis Date  . Allergy   . Anxiety    panic attacks- on occas.   . Arthritis    osteoporosis - especially hips.   . Asthma   . Confusion   . Dementia   . Depression   . Diverticulosis   . ETOH abuse   . Falls   . GERD (gastroesophageal reflux disease)   . Hyperlipidemia   . Hypertension   . Hyponatremia 07/05/2015  . Memory loss   . Normal 24 hour ambulatory pH monitoring study    good acid suppression  . Observed seizure-like activity ([place]) 07/05/2015   observed by husband , Dr Doy Mince  . Shortness of breath   . Stroke [facility])     Past Surgical History:  Procedure Laterality Date  . ABDOMINAL HYSTERECTOMY    . BREAST SURGERY     3x- biposies   . COLONOSCOPY WITH PROPOFOL N/A 08/05/2015   Procedure: COLONOSCOPY WITH PROPOFOL;  Surgeon: [name], MD;  Location: [facility];  Service: Endoscopy;  Laterality: N/A;  . DOBUTAMINE STRESS ECHO  07/09/2001   normal  . ESOPHAGOGASTRODUODENOSCOPY  06/11/2006   normal  . ESOPHAGOGASTRODUODENOSCOPY (EGD) WITH PROPOFOL N/A 08/05/2015   Procedure: ESOPHAGOGASTRODUODENOSCOPY (EGD) WITH PROPOFOL;  Surgeon: [name], MD;  [facility];  Service: Endoscopy;  Laterality: N/A;  . EYE SURGERY     cataracts removed, IOL- both eyes   . JOINT  REPLACEMENT     bilateral hips  . LOOP RECORDER IMPLANT N/A 10/14/2014   Procedure: LOOP RECORDER IMPLANT;  Surgeon: Evans Lance, MD;  Location: [facility] CATH LAB;  Service: Cardiovascular;  Laterality: N/A;  . NASAL SINUS SURGERY  1990  . REVERSE SHOULDER ARTHROPLASTY Right 08/13/2014   Procedure: REVERSE SHOULDER ARTHROPLASTY;  Surgeon: Marin Shutter, MD;  Location: [place];  Service: Orthopedics;  Laterality: Right;  interscalene block  . SHOULDER ARTHROSCOPY Left   . TEE WITHOUT CARDIOVERSION N/A 10/14/2014   Procedure: TRANSESOPHAGEAL ECHOCARDIOGRAM (TEE);  Surgeon: Sueanne Margarita, MD;  Location: [place];  Service: Cardiovascular;  Laterality: N/A;  . TOTAL HIP ARTHROPLASTY     left 1991/right 2008  . ULNAR NERVE TRANSPOSITION Right 08/01/2016   Procedure: RIGHT ULNAR NEUROPLASTY AT THE ELBOW;  Surgeon: Milly Jakob, MD;  Location: [place];  Service: Orthopedics;  Laterality: Right;    There were no vitals filed for this visit.      Subjective Assessment - 12/11/16 1436    Subjective Per husband he has noticed a positive improvement in L shoulder ER PROM, and that his wife (patient) has been asking for pain medicine less frequently. He reports even at home she is able to tolerate the home program.    Patient is accompained by:  Family member  Husband    Limitations Lifting;House hold activities   Diagnostic tests Old arthroscopic L shoulder sx, R reverse total shoulder sx.    Patient Stated Goals To have less pain    Currently in Pain? Yes   Pain Onset More than a month ago     Assessed AROM initially, mild deficit noted on L side which she reports as painful in flexion, abduction reported to be painful but no limits in AROM noted.   PROM ER noted to be roughly 50-60 degrees in supine at 90 degrees of abduction in supine x 10 minutes with oscillations (no pain reported).   Soft tissue mobilizations provided to pec minor area x 7 minutes which patient  reports is very beneficial and helpful .  Seated rows on OMEGA x 20# x 10 repetitions x 3 sets (no pain reported, appropriate mechanics)   Attempted band pull aparts (too painful), prone rows/horizontal abduction (both too painful)   Yellow band wall slides x 8 (cued to engage posterior cuff and scapular musculature).   Standing rows x 8 repetitions with 2# DB (noted to have increased humeral extension initially, cued for less extension).                             PT Education - 12/11/16 1514    Education provided Yes   Education Details Patient will likely have a plateau at some point, she seems to have increased AROM in her L shoulder.    Person(s) Educated Patient;Spouse   Methods Explanation;Demonstration;Verbal cues;Handout   Comprehension Returned demonstration;Verbalized understanding             PT Long Term Goals - 11/27/16 1621      PT LONG TERM GOAL #1   Title Patient will report a Quick Dash score of less than 60% to demonstrate improved tolerance for ADLs.    Baseline 72.7%   Time 8   Period Weeks   Status New     PT LONG TERM GOAL #2   Title Patient will demonstrate at least 155 degrees of active L shoulder flexion to demonstrate improved ability to complete ADLs.    Baseline 145 degrees    Time 8   Period Weeks   Status New     PT LONG TERM GOAL #3   Title Patient will report worst pain of 5/10 to demonstrate improved tolerance for ADLs.    Baseline 9/10   Time 8   Period Weeks   Status New               Plan - 12/11/16 1515    Clinical Impression Statement Patient continues to report activities are painful, without grimacing or providing other non-verbal signs of discomfort. It is difficult to state what improvement therapy has had, though husband reports patient is asking for pain medication less frequently and seems to be tolerating home program well. WIll likely see patient a few more times to advance HEP prior to  discharge.    Rehab Potential Fair   Clinical Impairments Affecting Rehab Potential Patient has short term memory loss and is unable to provide details on improved or worsened symptoms.    PT Frequency 1x / week   PT Duration 8 weeks   PT Treatment/Interventions ADLs/Self Care Home Management;Cryotherapy;Ultrasound;Moist Heat;Neuromuscular re-education;Patient/family education;Therapeutic exercise;Therapeutic activities;Manual techniques;Taping   PT Next Visit Plan Re-assess external rotation ROM, flexion, progress posterior shoulder strengthening.    PT Home  Exercise Plan Pec stretch, passive external rotation range of motion, low rows with red t-band    Consulted and Agree with Plan of Care Patient      Patient will benefit from skilled therapeutic intervention in order to improve the following deficits and impairments:  Pain, Impaired UE functional use, Decreased strength, Decreased range of motion  Visit Diagnosis: Chronic left shoulder pain  Chronic right shoulder pain     Problem List Patient Active Problem List   Diagnosis Date Noted  . Headache 11/14/2016  . Hearing loss 05/24/2016  . Enteritis due to Clostridium difficile 05/24/2016  . Leukocytosis 05/12/2016  . Thrombocytosis ([place]) 05/12/2016  . Cervical stenosis of spine 01/10/2016  . Loss of weight 12/06/2015  . Dysuria 10/14/2015  . Cerebrovascular accident (CVA) due to bilateral embolism of posterior cerebral arteries ([place]) 09/27/2015  . Chronic pain syndrome 09/27/2015  . Elevated blood pressure 07/07/2015  . Seizure-like activity ([place]) 07/04/2015  . Seizure ([place]) 07/04/2015  . Depression   . GI bleed 06/24/2015  . Frequent falls   . Narcotic abuse   . Benzodiazepine abuse   . Alcohol abuse   . Alcohol use with alcohol-induced persisting dementia ([place])   . Generalized anxiety disorder   . Hypokalemia   . Hypomagnesemia   . Anemia, iron deficiency   . Urinary retention   . Altered mental status  06/22/2015  . Fall 06/22/2015  . Iron deficiency anemia 06/03/2015  . Medicare annual wellness visit, subsequent 04/23/2015  . Advance care planning 04/23/2015  . Senile purpura ([place]) 04/23/2015  . Abnormal CBC 04/23/2015  . Sleep apnea 12/11/2014  . 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 Stroke ([place])   . Cerebral thrombosis with cerebral infarction ([place]) 10/13/2014  . HLD (hyperlipidemia)   . Facial droop 10/12/2014  . CKD (chronic kidney disease), stage III 10/12/2014  . S/P shoulder replacement 08/13/2014  . Postmenopausal HRT (hormone replacement therapy) 05/31/2014  . Hyponatremia 05/28/2014  . Shoulder pain, bilateral 10/25/2012  . Parkinsonism ([place]) 08/22/2012  . MEMORY LOSS 06/22/2010  . ALCOHOL ABUSE 03/08/2010  . 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 OBSTRUCTIVE CHRONIC BRONCHITIS WITH EXACERBATION 01/02/2008  . GLUCOSE INTOLERANCE 11/15/2007  . GERD 07/25/2007  . OSTEOARTHRITIS 07/25/2007  . PANIC ATTACK 05/01/2007  . Essential hypertension 05/01/2007  . ALLERGIC RHINITIS 05/01/2007  . ASTHMA 05/01/2007  . DIVERTICULOSIS, COLON 05/01/2007  . MENOPAUSAL DISORDER 05/01/2007  . FIBROMYALGIA 05/01/2007   Royce Macadamia PT, DPT, CSCS    12/11/2016, 3:19 PM  [facility] [address] [place], [address] Phone: [phone]   Fax:  [phone]  Name: Aimee Jones MRN: [id number] Date of Birth: [birth year]

## 2016-12-13 ENCOUNTER — Other Ambulatory Visit: Payer: Self-pay | Admitting: Family Medicine

## 2016-12-13 NOTE — Telephone Encounter (Signed)
Received refill request electronically Last office visit 11/13/16 Medication is no longer on medication list

## 2016-12-14 ENCOUNTER — Encounter: Payer: Medicare Other | Admitting: Physical Therapy

## 2016-12-14 NOTE — Telephone Encounter (Signed)
Spoke to patient's husband (DPR) and was advised that they did request the refill. Rx sent in as instructed.

## 2016-12-14 NOTE — Telephone Encounter (Signed)
Verify need.  If needed, then okay to send in as is. Thanks.

## 2016-12-16 IMAGING — CR DG CERVICAL SPINE 2 OR 3 VIEWS
3 series · 3 of 3 positions shown · non-contrast
Comparison: None.

CLINICAL DATA: Bilateral shoulder pain for several weeks.

EXAM:
CERVICAL SPINE - 2-3 VIEW

[view not recorded (1 of 3)]
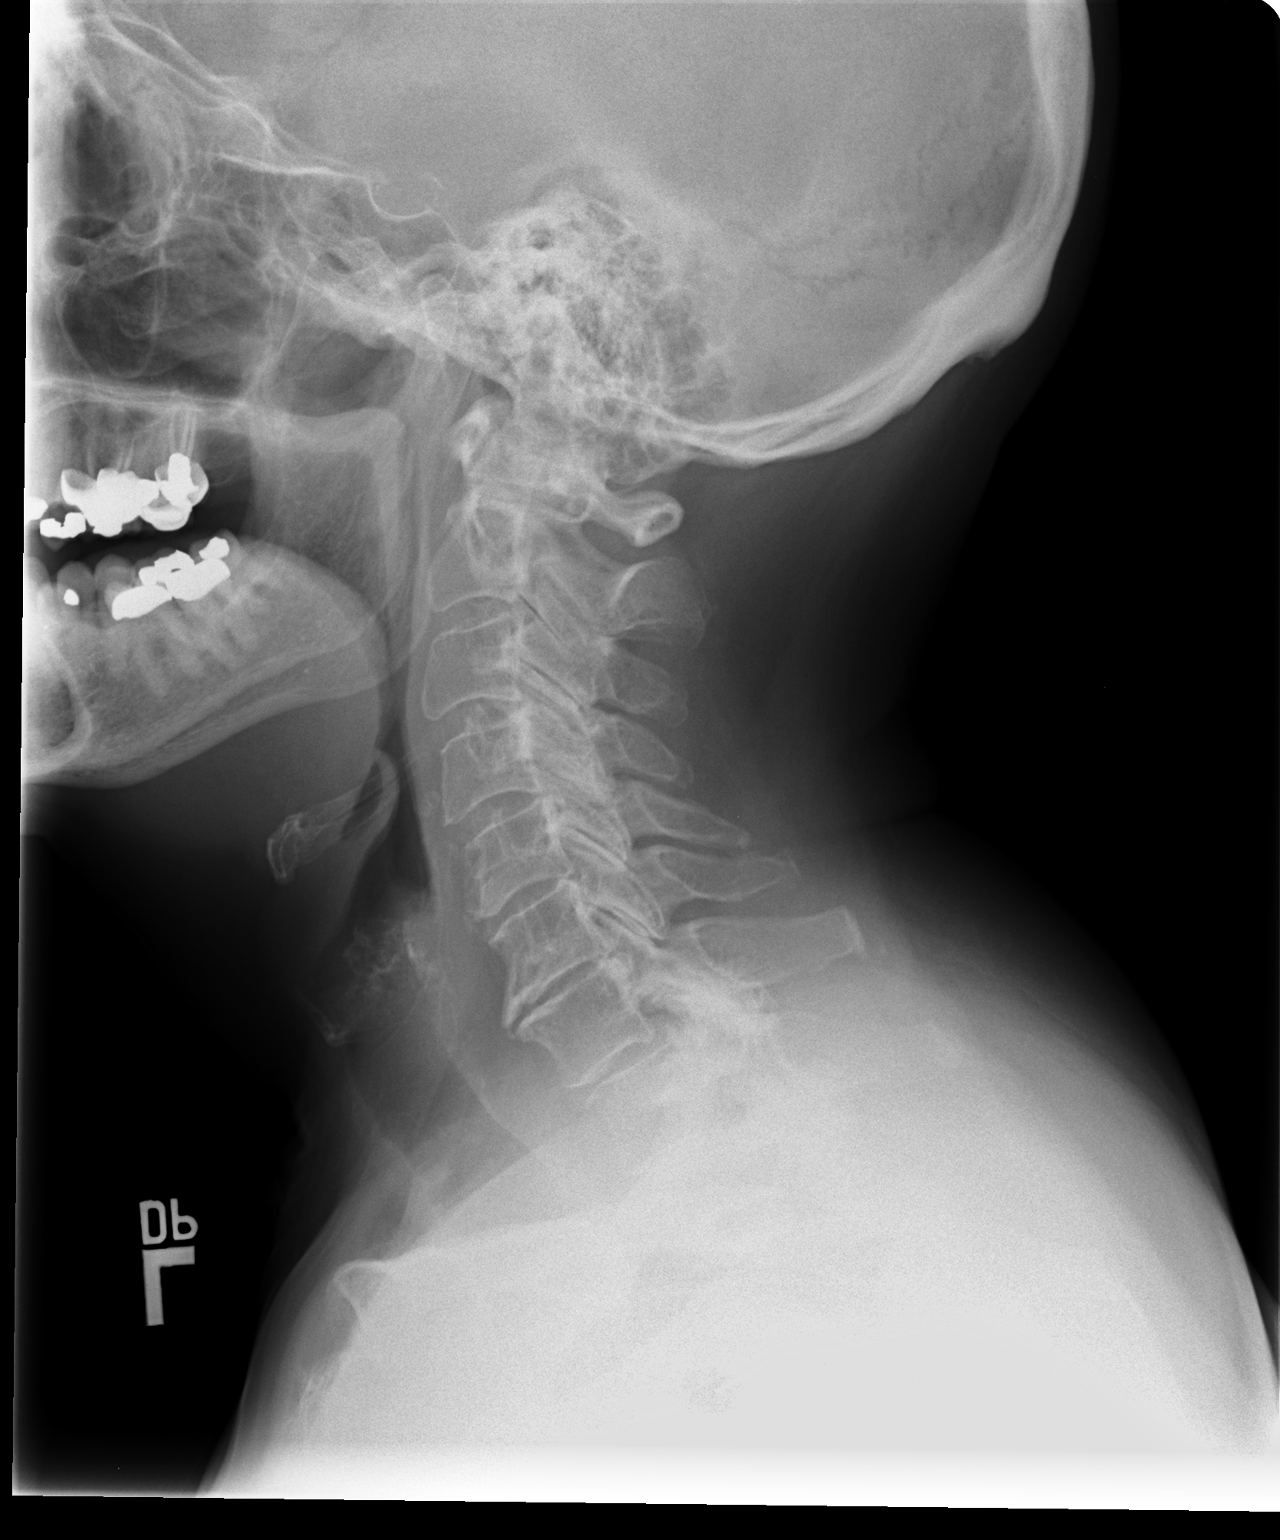

[view not recorded (2 of 3)]
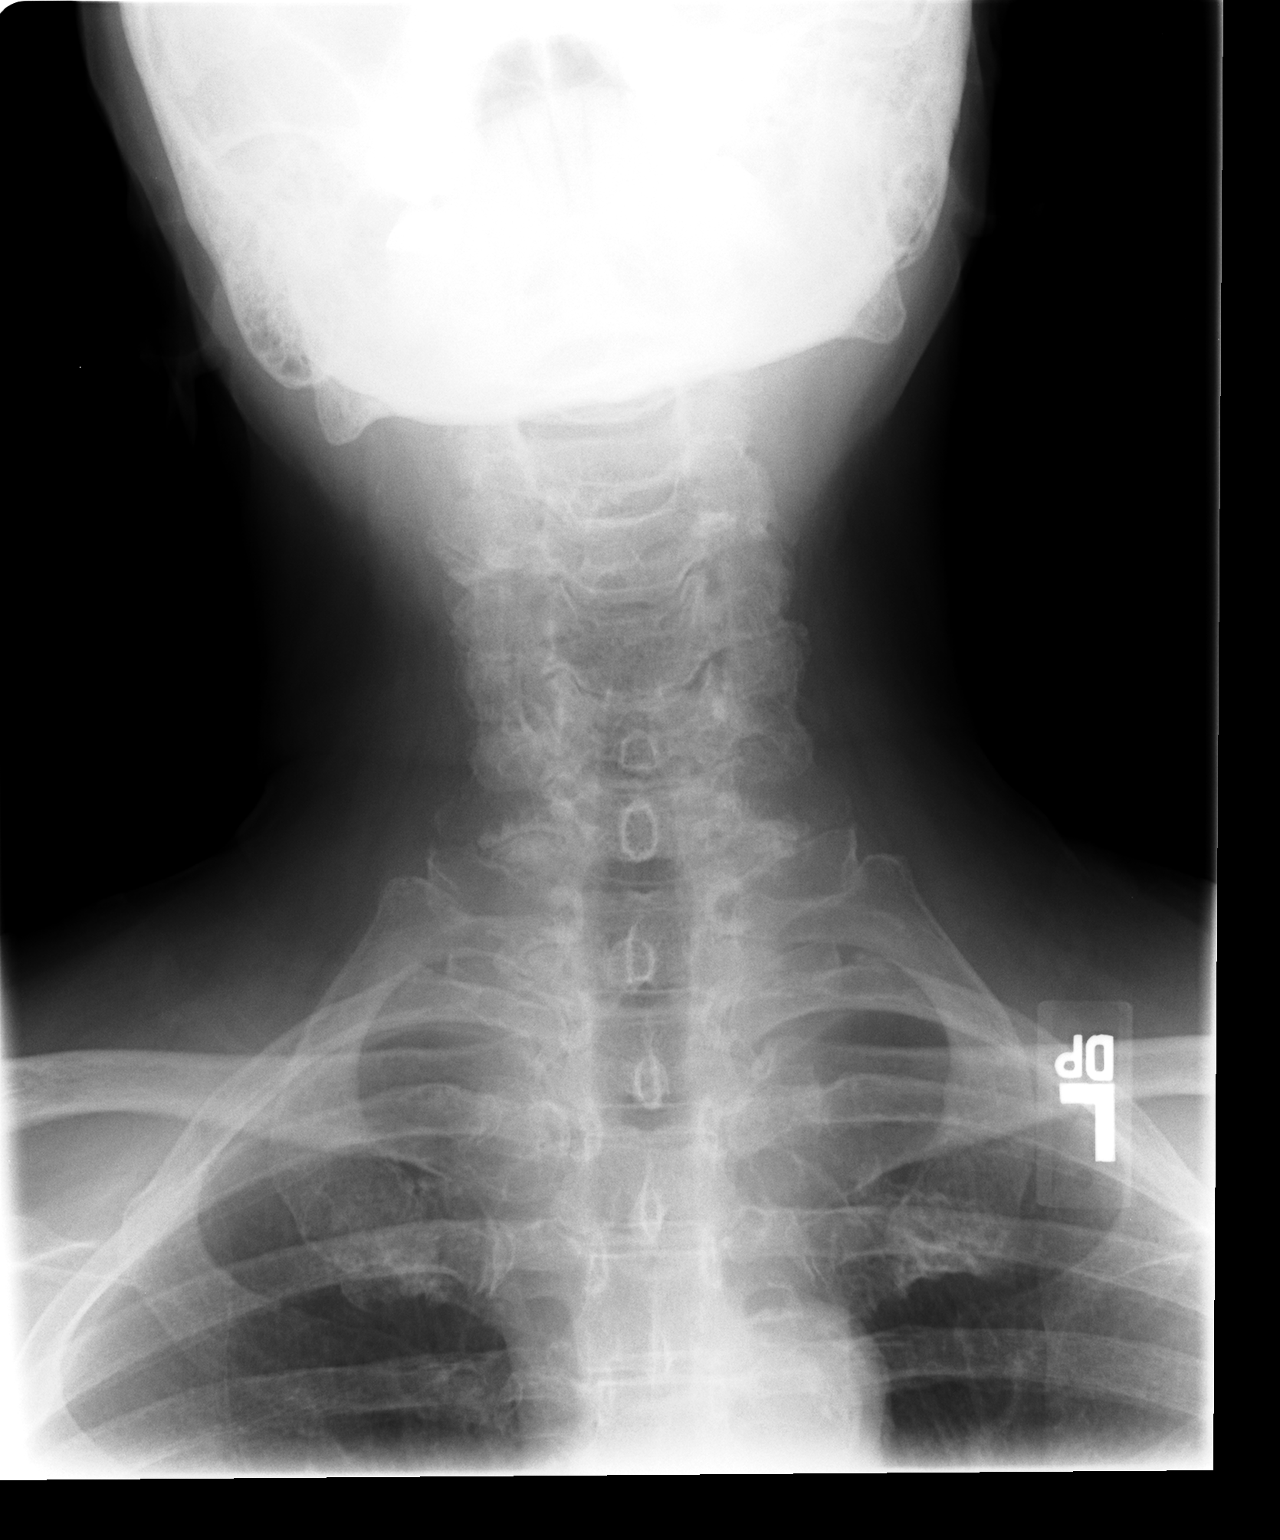

[view not recorded (3 of 3)]
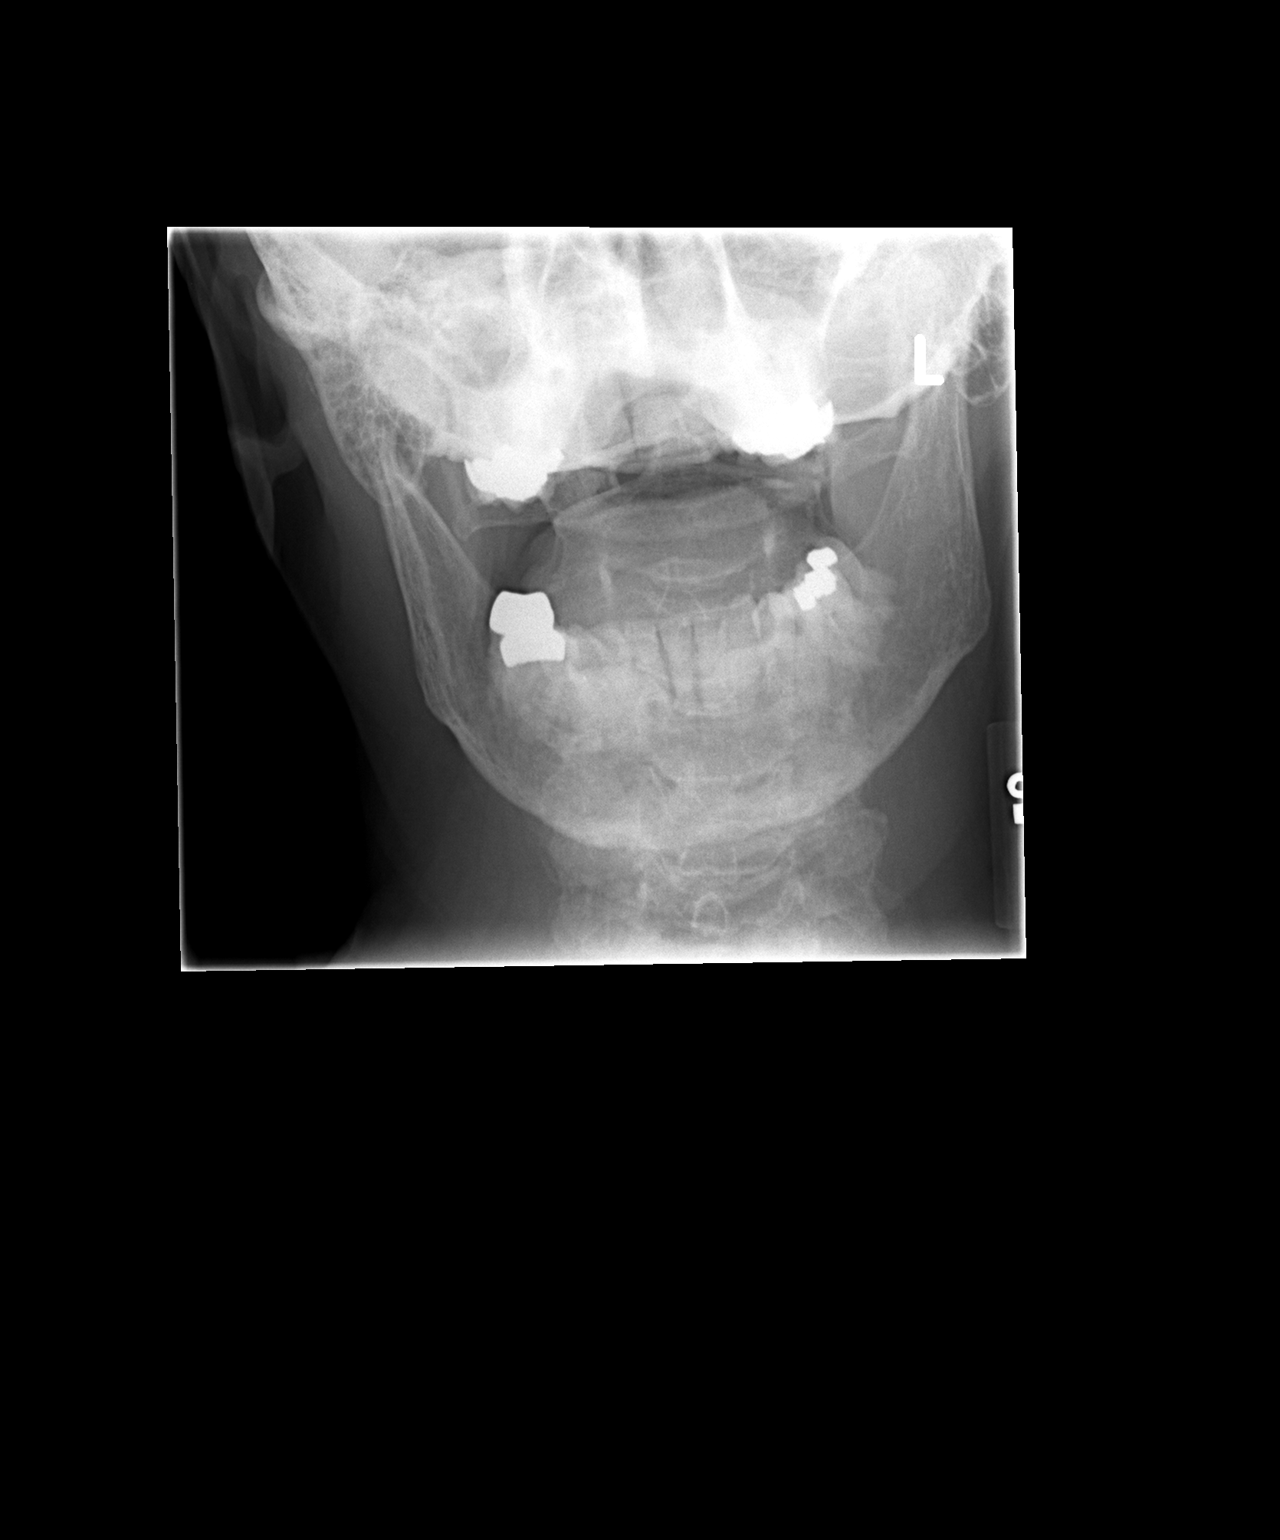

[3 of 3 positions shown; findings below may reference images not displayed]

FINDINGS: There is disc space narrowing at C5-C6, C6-C7 and C7-T1. There is
minimal anterolisthesis at C7-T1 likely secondary to facet
arthropathy. Prevertebral soft tissues are normal. Bilateral facet
disease on the AP view. Limited evaluation of the odontoid process.
IMPRESSION: No acute bone abnormality.

Multilevel cervical degenerative disease.

## 2016-12-19 ENCOUNTER — Ambulatory Visit: Payer: Medicare Other | Attending: Family Medicine | Admitting: Physical Therapy

## 2016-12-19 DIAGNOSIS — G8929 Other chronic pain: Secondary | ICD-10-CM

## 2016-12-19 DIAGNOSIS — M25512 Pain in left shoulder: Secondary | ICD-10-CM | POA: Diagnosis not present

## 2016-12-19 DIAGNOSIS — M25511 Pain in right shoulder: Secondary | ICD-10-CM | POA: Diagnosis not present

## 2016-12-19 NOTE — Patient Instructions (Addendum)
AROM L shoulder to 155 flexion   PROM to ER on LUE - 75 degrees with oscillations RUE initially to 60 degrees, with PROM and oscillations to 70-80 degrees   Low rows with red t-band x 15, green t-band x15  Mid rows with green band x 15 for 2 sets   OMEGA Rows x 15 for 2 sets at 20#

## 2016-12-20 NOTE — Therapy (Signed)
[facility] [address], [place], [address] Phone: [phone]   Fax:  [phone]  Physical Therapy Treatment  Patient Details  Name: Aimee Jones MRN: [id number] Date of Birth: [birth year] Referring Provider: [name] MD  Encounter Date: 12/19/2016      PT End of Session - 12/19/16 1533    Visit Number 4   Number of Visits 9   Date for PT Re-Evaluation 01/22/17   PT Start Time 9163   PT Stop Time 1556   PT Time Calculation (min) 39 min   Activity Tolerance Patient tolerated treatment well   Behavior During Therapy [facility] for tasks assessed/performed      Past Medical History:  Diagnosis Date  . Allergy   . Anxiety    panic attacks- on occas.   . Arthritis    osteoporosis - especially hips.   . Asthma   . Confusion   . Dementia   . Depression   . Diverticulosis   . ETOH abuse   . Falls   . GERD (gastroesophageal reflux disease)   . Hyperlipidemia   . Hypertension   . Hyponatremia 07/05/2015  . Memory loss   . Normal 24 hour ambulatory pH monitoring study    good acid suppression  . Observed seizure-like activity ([place]) 07/05/2015   observed by husband , Dr Doy Mince  . Shortness of breath   . Stroke [facility])     Past Surgical History:  Procedure Laterality Date  . ABDOMINAL HYSTERECTOMY    . BREAST SURGERY     3x- biposies   . COLONOSCOPY WITH PROPOFOL N/A 08/05/2015   Procedure: COLONOSCOPY WITH PROPOFOL;  Surgeon: [name], MD;  [facility];  Service: Endoscopy;  Laterality: N/A;  . DOBUTAMINE STRESS ECHO  07/09/2001   normal  . ESOPHAGOGASTRODUODENOSCOPY  06/11/2006   normal  . ESOPHAGOGASTRODUODENOSCOPY (EGD) WITH PROPOFOL N/A 08/05/2015   Procedure: ESOPHAGOGASTRODUODENOSCOPY (EGD) WITH PROPOFOL;  Surgeon: [name], MD;  [facility];  Service: Endoscopy;  Laterality: N/A;  . EYE SURGERY     cataracts removed, IOL- both eyes   . JOINT  REPLACEMENT     bilateral hips  . LOOP RECORDER IMPLANT N/A 10/14/2014   Procedure: LOOP RECORDER IMPLANT;  Surgeon: Evans Lance, MD;  Location: [facility] CATH LAB;  Service: Cardiovascular;  Laterality: N/A;  . NASAL SINUS SURGERY  1990  . REVERSE SHOULDER ARTHROPLASTY Right 08/13/2014   Procedure: REVERSE SHOULDER ARTHROPLASTY;  Surgeon: Marin Shutter, MD;  Location: [place];  Service: Orthopedics;  Laterality: Right;  interscalene block  . SHOULDER ARTHROSCOPY Left   . TEE WITHOUT CARDIOVERSION N/A 10/14/2014   Procedure: TRANSESOPHAGEAL ECHOCARDIOGRAM (TEE);  Surgeon: Sueanne Margarita, MD;  Location: [place];  Service: Cardiovascular;  Laterality: N/A;  . TOTAL HIP ARTHROPLASTY     left 1991/right 2008  . ULNAR NERVE TRANSPOSITION Right 08/01/2016   Procedure: RIGHT ULNAR NEUROPLASTY AT THE ELBOW;  Surgeon: Milly Jakob, MD;  Location: [place];  Service: Orthopedics;  Laterality: Right;    There were no vitals filed for this visit.      Subjective Assessment - 12/19/16 1519    Subjective Per husband patient has been having much less demand for pain medications (per his records this has decreased from 6 lines of medications to 3 administrations). She reports her R shoulder was just operated on, however this was some time ago.    Patient is  accompained by: Family member  Husband    Limitations Lifting;House hold activities   Diagnostic tests Old arthroscopic L shoulder sx, R reverse total shoulder sx.    Patient Stated Goals To have less pain    Currently in Pain? Yes   Pain Onset More than a month ago      AROM L shoulder to 155 flexion   PROM to ER on LUE - 75 degrees with oscillations RUE initially to 60 degrees, with PROM and oscillations to 70-80 degrees   Low rows with red t-band x 15, green t-band x15  Mid rows with green band x 15 for 2 sets   OMEGA Rows x 15 for 2 sets at 20#                             PT Education -  12/19/16 1545    Education provided Yes   Education Details Will continue to progress strengthening, working on [place] now that LUE has returned more towards expected baseline.    Person(s) Educated Patient;Spouse   Methods Demonstration;Explanation;Handout   Comprehension Verbalized understanding;Returned demonstration             PT Long Term Goals - 11/27/16 1621      PT LONG TERM GOAL #1   Title Patient will report a Quick Dash score of less than 60% to demonstrate improved tolerance for ADLs.    Baseline 72.7%   Time 8   Period Weeks   Status New     PT LONG TERM GOAL #2   Title Patient will demonstrate at least 155 degrees of active L shoulder flexion to demonstrate improved ability to complete ADLs.    Baseline 145 degrees    Time 8   Period Weeks   Status New     PT LONG TERM GOAL #3   Title Patient will report worst pain of 5/10 to demonstrate improved tolerance for ADLs.    Baseline 9/10   Time 8   Period Weeks   Status New               Plan - 12/19/16 1534    Clinical Impression Statement Patient now demonstrates symmetrical OH flexion ROM, and per her husband her requests for pain medication have become much less frequent. She has cut her pain medication by at least half, ER   Rehab Potential Fair   Clinical Impairments Affecting Rehab Potential Patient has short term memory loss and is unable to provide details on improved or worsened symptoms.    PT Frequency 1x / week   PT Duration 8 weeks   PT Treatment/Interventions ADLs/Self Care Home Management;Cryotherapy;Ultrasound;Moist Heat;Neuromuscular re-education;Patient/family education;Therapeutic exercise;Therapeutic activities;Manual techniques;Taping   PT Next Visit Plan Re-assess external rotation ROM, flexion, progress posterior shoulder strengthening.    PT Home Exercise Plan Pec stretch, passive external rotation range of motion, low rows with red t-band    Consulted and Agree with Plan of Care  Patient      Patient will benefit from skilled therapeutic intervention in order to improve the following deficits and impairments:  Pain, Impaired UE functional use, Decreased strength, Decreased range of motion  Visit Diagnosis: Chronic left shoulder pain  Chronic right shoulder pain     Problem List Patient Active Problem List   Diagnosis Date Noted  . Headache 11/14/2016  . Hearing loss 05/24/2016  . Enteritis due to Clostridium difficile 05/24/2016  . Leukocytosis 05/12/2016  .  Thrombocytosis ([place]) 05/12/2016  . Cervical stenosis of spine 01/10/2016  . Loss of weight 12/06/2015  . Dysuria 10/14/2015  . Cerebrovascular accident (CVA) due to bilateral embolism of posterior cerebral arteries ([place]) 09/27/2015  . Chronic pain syndrome 09/27/2015  . Elevated blood pressure 07/07/2015  . Seizure-like activity ([place]) 07/04/2015  . Seizure ([place]) 07/04/2015  . Depression   . GI bleed 06/24/2015  . Frequent falls   . Narcotic abuse   . Benzodiazepine abuse   . Alcohol abuse   . Alcohol use with alcohol-induced persisting dementia ([place])   . Generalized anxiety disorder   . Hypokalemia   . Hypomagnesemia   . Anemia, iron deficiency   . Urinary retention   . Altered mental status 06/22/2015  . Fall 06/22/2015  . Iron deficiency anemia 06/03/2015  . Medicare annual wellness visit, subsequent 04/23/2015  . Advance care planning 04/23/2015  . Senile purpura ([place]) 04/23/2015  . Abnormal CBC 04/23/2015  . Sleep apnea 12/11/2014  . 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 Stroke ([place])   . Cerebral thrombosis with cerebral infarction ([place]) 10/13/2014  . HLD (hyperlipidemia)   . Facial droop 10/12/2014  . CKD (chronic kidney disease), stage III 10/12/2014  . S/P shoulder replacement 08/13/2014  . Postmenopausal HRT (hormone replacement therapy) 05/31/2014  . Hyponatremia 05/28/2014  . Shoulder pain, bilateral 10/25/2012  . Parkinsonism ([place]) 08/22/2012  .  MEMORY LOSS 06/22/2010  . ALCOHOL ABUSE 03/08/2010  . 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 OBSTRUCTIVE CHRONIC BRONCHITIS WITH EXACERBATION 01/02/2008  . GLUCOSE INTOLERANCE 11/15/2007  . GERD 07/25/2007  . OSTEOARTHRITIS 07/25/2007  . PANIC ATTACK 05/01/2007  . Essential hypertension 05/01/2007  . ALLERGIC RHINITIS 05/01/2007  . ASTHMA 05/01/2007  . DIVERTICULOSIS, COLON 05/01/2007  . MENOPAUSAL DISORDER 05/01/2007  . FIBROMYALGIA 05/01/2007    Royce Macadamia 12/20/2016, 8:20 AM  [facility] [address] [place], [address] Phone: [phone]   Fax:  [phone]  Name: JEANY SEVILLE MRN: [id number] Date of Birth: [birth year]

## 2016-12-25 ENCOUNTER — Encounter: Payer: Self-pay | Admitting: Emergency Medicine

## 2016-12-25 ENCOUNTER — Emergency Department
Admission: EM | Admit: 2016-12-25 | Discharge: 2016-12-25 | Disposition: A | Discharge status: Home / Self Care | Payer: Medicare Other | Attending: Emergency Medicine | Admitting: Emergency Medicine

## 2016-12-25 DIAGNOSIS — J45909 Unspecified asthma, uncomplicated: Secondary | ICD-10-CM | POA: Insufficient documentation

## 2016-12-25 DIAGNOSIS — Z79899 Other long term (current) drug therapy: Secondary | ICD-10-CM | POA: Diagnosis not present

## 2016-12-25 DIAGNOSIS — L989 Disorder of the skin and subcutaneous tissue, unspecified: Secondary | ICD-10-CM | POA: Diagnosis not present

## 2016-12-25 DIAGNOSIS — I10 Essential (primary) hypertension: Secondary | ICD-10-CM | POA: Insufficient documentation

## 2016-12-25 DIAGNOSIS — N898 Other specified noninflammatory disorders of vagina: Secondary | ICD-10-CM | POA: Diagnosis present

## 2016-12-25 DIAGNOSIS — R238 Other skin changes: Secondary | ICD-10-CM

## 2016-12-25 LAB — URINALYSIS, COMPLETE (UACMP) WITH MICROSCOPIC
Bacteria, UA: NONE SEEN
Bilirubin Urine: NEGATIVE
GLUCOSE, UA: NEGATIVE mg/dL
Ketones, ur: NEGATIVE mg/dL
NITRITE: NEGATIVE
Protein, ur: NEGATIVE mg/dL
Specific Gravity, Urine: 1.027 (ref 1.005–1.030)
pH: 5 (ref 5.0–8.0)

## 2016-12-25 NOTE — ED Notes (Signed)
Patient reports scant vaginal bleeding x 2 weeks that has gotten heavier today.  Patient reports dry skin, itching, and redness to the area.  Patient reports history of hysterectomy and reports burning with urination today.

## 2016-12-25 NOTE — ED Triage Notes (Signed)
Per husband patient has had vaginal itching and external itching and bleeding x 1 week. Patient has dementia and is not able to give history.

## 2016-12-25 NOTE — Consult Note (Signed)
Consult History and Physical   SERVICE: Gynecology   Patient Name: Aimee Jones Patient MRN:   [id number]  CC: vaginal bleeding  HPI: Aimee Jones is a 71 y.o. who presented to the ED with vaginal bleeding.  Patient has short term memory loss and her husband serves as the historian.  About 3 weeks ago she noted some blood on her panty liner.  Scant, bright red.  She has been itching in the vulva area.  She is s/p hysterectomy.  Today, she noted a significant increase in the amount of bleeding, and was going to her PCP's office, which was closed do to the snow.  She reported to the ED instead.  The bleeding has stopped.  She has no pain, no trauma, no sexual activity, no foreign bodies.  She takes Plavix daily.  She denies urinary incontinence.  She was evaluated by Dr. Rip Harbour who asked me to come take a look.     Review of Systems: positives in bold GEN:   fevers, chills, weight changes, appetite changes, fatigue, night sweats HEENT:  HA, vision changes, hearing loss, congestion, rhinorrhea, sinus pressure, dysphagia CV:   CP, palpitations PULM:  SOB, cough GI:  abd pain, N/V/D/C GU:  dysuria, urgency, frequency MSK:  arthralgias, myalgias, back pain, swelling SKIN:  rashes, color changes, pallor NEURO:  numbness, weakness, tingling, seizures, dizziness, tremors PSYCH:  depression, anxiety, behavioral problems, confusion  HEME/LYMPH:  easy bruising or bleeding ENDO:  heat/cold intolerance  Past OB: [id number]  Past Gynecologic History: No LMP recorded. Patient has had a hysterectomy.   Past Medical History: Past Medical History:  Diagnosis Date  . Allergy   . Anxiety    panic attacks- on occas.   . Arthritis    osteoporosis - especially hips.   . Asthma   . Confusion   . Dementia   . Depression   . Diverticulosis   . ETOH abuse   . Falls   . GERD (gastroesophageal reflux disease)   . Hyperlipidemia   . Hypertension   . Hyponatremia 07/05/2015  . Memory  loss   . Normal 24 hour ambulatory pH monitoring study    good acid suppression  . Observed seizure-like activity ([place]) 07/05/2015   observed by husband , Dr Doy Mince  . Shortness of breath   . Stroke [facility])     Past Surgical History:   Past Surgical History:  Procedure Laterality Date  . ABDOMINAL HYSTERECTOMY    . BREAST SURGERY     3x- biposies   . COLONOSCOPY WITH PROPOFOL N/A 08/05/2015   Procedure: COLONOSCOPY WITH PROPOFOL;  Surgeon: [name], MD;  Location: [facility];  Service: Endoscopy;  Laterality: N/A;  . DOBUTAMINE STRESS ECHO  07/09/2001   normal  . ESOPHAGOGASTRODUODENOSCOPY  06/11/2006   normal  . ESOPHAGOGASTRODUODENOSCOPY (EGD) WITH PROPOFOL N/A 08/05/2015   Procedure: ESOPHAGOGASTRODUODENOSCOPY (EGD) WITH PROPOFOL;  Surgeon: [name], MD;  Location[facility];  Service: Endoscopy;  Laterality: N/A;  . EYE SURGERY     cataracts removed, IOL- both eyes   . JOINT REPLACEMENT     bilateral hips  . LOOP RECORDER IMPLANT N/A 10/14/2014   Procedure: LOOP RECORDER IMPLANT;  Surgeon: Evans Lance, MD;  Location: [facility] CATH LAB;  Service: Cardiovascular;  Laterality: N/A;  . NASAL SINUS SURGERY  1990  . REVERSE SHOULDER ARTHROPLASTY Right 08/13/2014   Procedure: REVERSE SHOULDER ARTHROPLASTY;  Surgeon: Marin Shutter, MD;  Location: [place];  Service: Orthopedics;  Laterality: Right;  interscalene block  . SHOULDER ARTHROSCOPY Left   . TEE WITHOUT CARDIOVERSION N/A 10/14/2014   Procedure: TRANSESOPHAGEAL ECHOCARDIOGRAM (TEE);  Surgeon: Sueanne Margarita, MD;  Location: [place];  Service: Cardiovascular;  Laterality: N/A;  . TOTAL HIP ARTHROPLASTY     left 1991/right 2008  . ULNAR NERVE TRANSPOSITION Right 08/01/2016   Procedure: RIGHT ULNAR NEUROPLASTY AT THE ELBOW;  Surgeon: Milly Jakob, MD;  Location: [place];  Service: Orthopedics;  Laterality: Right;    Family History:  family history includes Esophageal cancer in her  brother; Heart attack in her brother and father; Heart block in her mother; Hypertension in her brother; Stroke in her brother and mother; Sudden death in her brother.  Social History:  Social History   Social History  . Marital status: Married    Spouse name: N/A  . Number of children: N/A  . Years of education: N/A   Occupational History  . retired Retired    [employer]   Social History Main Topics  . Smoking status: Never Smoker  . Smokeless tobacco: Never Used  . Alcohol use No  . Drug use: No  . Sexual activity: Yes   Other Topics Concern  . Not on file   Social History Narrative   Married. Retired.      Home Medications:  Medications reconciled in EPIC  No current facility-administered medications on file prior to encounter.    Current Outpatient Prescriptions on File Prior to Encounter  Medication Sig Dispense Refill  . acetaminophen (TYLENOL) 325 MG tablet Take 2 tablets (650 mg total) by mouth 3 (three) times daily as needed.    Marland Kitchen amLODipine (NORVASC) 2.5 MG tablet TAKE 1 TABLET BY MOUTH DAILY 30 tablet 5  . atorvastatin (LIPITOR) 10 MG tablet TAKE 1 TABLET BY MOUTH DAILY 90 tablet 1  . budesonide-formoterol (SYMBICORT) 160-4.5 MCG/ACT inhaler INHALE 2 PUFFS INTO THE LUNGS 2 (TWO) TIMES DAILY AS NEEDED (SHORTNESS OF BREATH).    . citalopram (CELEXA) 20 MG tablet TAKE 1 TABLET BY MOUTH EVERY DAY 90 tablet 1  . clopidogrel (PLAVIX) 75 MG tablet TAKE 1 TABLET (75 MG TOTAL) BY MOUTH DAILY.    Marland Kitchen donepezil (ARICEPT) 10 MG tablet TAKE 1 TABLET BY MOUTH DAILY AT BEDTIME 90 tablet 1  . doxepin (SINEQUAN) 25 MG capsule TAKE 1 CAPSULE BY MOUTH DAILY AT BEDTIME  5  . doxepin (SINEQUAN) 25 MG capsule TAKE 1 CAPSULE BY MOUTH DAILY AT BEDTIME 90 capsule 1  . fentaNYL (DURAGESIC - DOSED MCG/HR) 25 MCG/HR patch Place 25 mcg onto the skin every 3 (three) days.    . fluticasone (FLONASE) 50 MCG/ACT nasal spray Place into the nose.    . hydrocortisone 2.5 % cream APPLY RECTALLY 2  TIMES DAILY 28.35 g 0  . ibuprofen (MOTRIN IB) 200 MG tablet Take 2 tablets (400 mg total) by mouth 3 (three) times daily as needed (with food).    . lansoprazole (PREVACID) 15 MG capsule TAKE ONE CAPSULE BY MOUTH EVERY DAY 90 capsule 1  . montelukast (SINGULAIR) 10 MG tablet Take 10 mg by mouth.    . Multiple Vitamins-Minerals (EYE VITAMINS) CAPS Take 1 capsule by mouth daily.    . Omega-3 Fatty Acids (FISH OIL PO) Take by mouth.    . ondansetron (ZOFRAN) 8 MG tablet Take 8 mg by mouth every 8 (eight) hours as needed.     Marland Kitchen oxyCODONE-acetaminophen (PERCOCET) 7.5-325 MG tablet Take 1 tablet by mouth 2 (  two) times daily.  0  . polyvinyl alcohol (LIQUIFILM TEARS) 1.4 % ophthalmic solution Place 1 drop into both eyes as needed.     . pregabalin (LYRICA) 75 MG capsule Take 75 mg by mouth 2 (two) times daily.    . promethazine (PHENERGAN) 25 MG tablet Take 25 mg by mouth.    . vitamin E 400 UNIT capsule Take by mouth.    . Vitamins/Minerals TABS Take by mouth.      Allergies:  Allergies  Allergen Reactions  . Tramadol Other (See Comments)    Would avoid.  History of seizure on medication  . Latex Rash  . Penicillins Rash    .Marland KitchenHas patient had a PCN reaction causing immediate rash, facial/tongue/throat swelling, SOB or lightheadedness with hypotension: No Has patient had a PCN reaction causing severe rash involving mucus membranes or skin necrosis: No Has patient had a PCN reaction that required hospitalization No Has patient had a PCN reaction occurring within the last 10 years: No If all of the above answers are "NO", then may proceed with Cephalosporin use.      Physical Exam:  Temp:  [98 F (36.7 C)] 98 F (36.7 C) (03/12 1456) Pulse Rate:  [63-69] 68 (03/12 1800) Resp:  [13-18] 13 (03/12 1800) BP: (104-128)/(66-85) 115/71 (03/12 1800) SpO2:  [97 %] 97 % (03/12 1800) Weight:  [68 kg (150 lb)] 68 kg (150 lb) (03/12 1456)   General Appearance:  Well developed, well nourished, no  acute distress, alert and oriented, cooperative and appears stated age 65:  Normocephalic atraumatic, extraocular movements intact, moist mucous membranes, neck supple with midline trachea and thyroid without masses Cardiovascular:  Normal S1/S2, regular rate and rhythm, no murmurs, 2+ distal pulses Pulmonary:  clear to auscultation, no wheezes, rales or rhonchi, symmetric air entry, good air exchange Abdomen:  Bowel sounds present, soft, nontender, nondistended, no abnormal masses or organomegaly, no epigastric pain Back: inspection of back is normal Extremities:  extremities normal, no tenderness, atraumatic, no cyanosis or edema Skin:  normal coloration and turgor, no rashes, no suspicious skin lesions noted  Neurologic:  Cranial nerves 2-12 grossly intact, grossly equal strength and muscle tone, normal speech, no focal findings or movement disorder noted. Psychiatric:  Normal mood and affect, appropriate, no AH/VH Pelvic:  Atrophic vaginal mucosa, no vaginal bleeding or discharge, no pelvic organ prolapse, externa vulva: mons WNL, bilateral labia majora+minor excoriated and raw in areas.  On left labia there is a firm nodule beneath the surface of the skin, well circumscribed, mobile.  The skin above this is open, and was the obvious source of bleeding, now not bleeding with scab.   Labs/Studies:   CBC and Coags:  Lab Results  Component Value Date   WBC 11.8 (H) 11/09/2016   NEUTOPHILPCT 66 11/09/2016   EOSPCT 11 11/09/2016   BASOPCT 1 11/09/2016   LYMPHOPCT 17 11/09/2016   HGB 12.9 11/09/2016   HCT 39.3 11/09/2016   MCV 81.5 11/09/2016   PLT 605 (H) 11/09/2016   INR 1.19 06/25/2015   CMP:  Lab Results  Component Value Date   NA 129 (L) 05/19/2016   K 4.3 05/19/2016   CL 97 05/19/2016   CO2 27 05/19/2016   BUN 17 05/19/2016   CREATININE 0.91 05/19/2016   CREATININE 1.04 12/06/2015   CREATININE 0.92 11/01/2015   PROT 6.4 05/19/2016   BILITOT 0.2 05/19/2016   BILIDIR  0.0 11/03/2010   ALT 20 05/19/2016   AST 22 05/19/2016  ALKPHOS 71 05/19/2016   Urinalysis    Component Value Date/Time   COLORURINE YELLOW (A) 12/25/2016 1627   APPEARANCEUR CLEAR (A) 12/25/2016 1627   LABSPEC 1.027 12/25/2016 1627   PHURINE 5.0 12/25/2016 1627   GLUCOSEU NEGATIVE 12/25/2016 1627   GLUCOSEU NEGATIVE 03/22/2015 1528   HGBUR LARGE (A) 12/25/2016 1627   HGBUR negative 09/21/2007 1004   BILIRUBINUR NEGATIVE 12/25/2016 1627   BILIRUBINUR negative 09/01/2016 0858   KETONESUR NEGATIVE 12/25/2016 1627   PROTEINUR NEGATIVE 12/25/2016 1627   UROBILINOGEN negative 09/01/2016 0858   UROBILINOGEN 0.2 07/04/2015 1955   NITRITE NEGATIVE 12/25/2016 1627   LEUKOCYTESUR TRACE (A) 12/25/2016 1627      Assessment / Plan:   Aimee Jones is a 71 y.o. with excoriated vulvar tissues  1. Apply desitin or butt paste mixed with hydrocortisone cream 2.5% to the bilateral labia twice daily 2. Follow up with me for biopsy of the labial cyst and progress in the next 2 weeks. 3. Call my office with questions   Thank you for the opportunity to be involved with this patient's care.  ----- Larey Days, MD Attending Obstetrician and Gynecologist [facility], Department of [facility]

## 2016-12-25 NOTE — Discharge Instructions (Signed)
Apply hydrocortisone and Desitin ointment to the area twice a day. Please follow-up with Dr. Leonides Schanz OB/GYN in 2 weeks call for the appointment tomorrow morning. Tell the office staff that Dr. Leonides Schanz saw you in the emergency room.

## 2016-12-25 NOTE — ED Provider Notes (Signed)
[facility] Emergency Department Provider Note   ____________________________________________   First MD Initiated Contact with Patient 12/25/16 1629     (approximate)  I have reviewed the triage vital signs and the nursing notes.   HISTORY  Chief Complaint Vaginal Itching    HPI Aimee Jones is a 71 y.o. female who reports about 2 weeks of vaginal itching and scant bleeding spotting today became very heavy and was tripping on the floor. Patient has a history of hysterectomy and dysuria today. She has no other complaints.   Past Medical History:  Diagnosis Date  . Allergy   . Anxiety    panic attacks- on occas.   . Arthritis    osteoporosis - especially hips.   . Asthma   . Confusion   . Dementia   . Depression   . Diverticulosis   . ETOH abuse   . Falls   . GERD (gastroesophageal reflux disease)   . Hyperlipidemia   . Hypertension   . Hyponatremia 07/05/2015  . Memory loss   . Normal 24 hour ambulatory pH monitoring study    good acid suppression  . Observed seizure-like activity ([place]) 07/05/2015   observed by husband , Dr Doy Mince  . Shortness of breath   . Stroke [facility])     Patient Active Problem List   Diagnosis Date Noted  . Headache 11/14/2016  . Hearing loss 05/24/2016  . Enteritis due to Clostridium difficile 05/24/2016  . Leukocytosis 05/12/2016  . Thrombocytosis ([place]) 05/12/2016  . Cervical stenosis of spine 01/10/2016  . Loss of weight 12/06/2015  . Dysuria 10/14/2015  . Cerebrovascular accident (CVA) due to bilateral embolism of posterior cerebral arteries ([place]) 09/27/2015  . Chronic pain syndrome 09/27/2015  . Elevated blood pressure 07/07/2015  . Seizure-like activity ([place]) 07/04/2015  . Seizure ([place]) 07/04/2015  . Depression   . GI bleed 06/24/2015  . Frequent falls   . Narcotic abuse   . Benzodiazepine abuse   . Alcohol abuse   . 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  Generalized anxiety disorder   . Hypokalemia   . Hypomagnesemia   . Anemia, iron deficiency   . Urinary retention   . Altered mental status 06/22/2015  . Fall 06/22/2015  . Iron deficiency anemia 06/03/2015  . Medicare annual wellness visit, subsequent 04/23/2015  . Advance care planning 04/23/2015  . Senile purpura ([place]) 04/23/2015  . Abnormal CBC 04/23/2015  . Sleep apnea 12/11/2014  . 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 Stroke ([place])   . Cerebral thrombosis with cerebral infarction ([place]) 10/13/2014  . HLD (hyperlipidemia)   . Facial droop 10/12/2014  . CKD (chronic kidney disease), stage III 10/12/2014  . S/P shoulder replacement 08/13/2014  . Postmenopausal HRT (hormone replacement therapy) 05/31/2014  . Hyponatremia 05/28/2014  . Shoulder pain, bilateral 10/25/2012  . Parkinsonism ([place]) 08/22/2012  . MEMORY LOSS 06/22/2010  . ALCOHOL ABUSE 03/08/2010  . 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 OBSTRUCTIVE CHRONIC BRONCHITIS WITH EXACERBATION 01/02/2008  . GLUCOSE INTOLERANCE 11/15/2007  . GERD 07/25/2007  . OSTEOARTHRITIS 07/25/2007  . PANIC ATTACK 05/01/2007  . Essential hypertension 05/01/2007  . ALLERGIC RHINITIS 05/01/2007  . ASTHMA 05/01/2007  . DIVERTICULOSIS, COLON 05/01/2007  . MENOPAUSAL DISORDER 05/01/2007  . FIBROMYALGIA 05/01/2007    Past Surgical History:  Procedure Laterality Date  . ABDOMINAL HYSTERECTOMY    . BREAST SURGERY     3x- biposies   . COLONOSCOPY WITH PROPOFOL N/A 08/05/2015  Procedure: COLONOSCOPY WITH PROPOFOL;  Surgeon: [name], MD;  [facility];  Service: Endoscopy;  Laterality: N/A;  . DOBUTAMINE STRESS ECHO  07/09/2001   normal  . ESOPHAGOGASTRODUODENOSCOPY  06/11/2006   normal  . ESOPHAGOGASTRODUODENOSCOPY (EGD) WITH PROPOFOL N/A 08/05/2015   Procedure: ESOPHAGOGASTRODUODENOSCOPY (EGD) WITH PROPOFOL;  Surgeon: [name], MD;  [facility];  Service: Endoscopy;  Laterality: N/A;    . EYE SURGERY     cataracts removed, IOL- both eyes   . JOINT REPLACEMENT     bilateral hips  . LOOP RECORDER IMPLANT N/A 10/14/2014   Procedure: LOOP RECORDER IMPLANT;  Surgeon: Evans Lance, MD;  Location[facility] CATH LAB;  Service: Cardiovascular;  Laterality: N/A;  . NASAL SINUS SURGERY  1990  . REVERSE SHOULDER ARTHROPLASTY Right 08/13/2014   Procedure: REVERSE SHOULDER ARTHROPLASTY;  Surgeon: Marin Shutter, MD;  Location: [place];  Service: Orthopedics;  Laterality: Right;  interscalene block  . SHOULDER ARTHROSCOPY Left   . TEE WITHOUT CARDIOVERSION N/A 10/14/2014   Procedure: TRANSESOPHAGEAL ECHOCARDIOGRAM (TEE);  Surgeon: Sueanne Margarita, MD;  Location: [place];  Service: Cardiovascular;  Laterality: N/A;  . TOTAL HIP ARTHROPLASTY     left 1991/right 2008  . ULNAR NERVE TRANSPOSITION Right 08/01/2016   Procedure: RIGHT ULNAR NEUROPLASTY AT THE ELBOW;  Surgeon: Milly Jakob, MD;  Location: [place];  Service: Orthopedics;  Laterality: Right;    Prior to Admission medications   Medication Sig Start Date End Date Taking? Authorizing Provider  acetaminophen (TYLENOL) 325 MG tablet Take 2 tablets (650 mg total) by mouth 3 (three) times daily as needed. 11/13/16   Tonia Ghent, MD  amLODipine (NORVASC) 2.5 MG tablet TAKE 1 TABLET BY MOUTH DAILY 09/04/16   Tonia Ghent, MD  atorvastatin (LIPITOR) 10 MG tablet TAKE 1 TABLET BY MOUTH DAILY 08/09/16   Tonia Ghent, MD  budesonide-formoterol (SYMBICORT) 160-4.5 MCG/ACT inhaler INHALE 2 PUFFS INTO THE LUNGS 2 (TWO) TIMES DAILY AS NEEDED (SHORTNESS OF BREATH). 08/23/15   Historical Provider, MD  citalopram (CELEXA) 20 MG tablet TAKE 1 TABLET BY MOUTH EVERY DAY 07/10/16   Tonia Ghent, MD  clopidogrel (PLAVIX) 75 MG tablet TAKE 1 TABLET (75 MG TOTAL) BY MOUTH DAILY. 12/16/15   Historical Provider, MD  donepezil (ARICEPT) 10 MG tablet TAKE 1 TABLET BY MOUTH DAILY AT BEDTIME 08/01/16   Tonia Ghent, MD  doxepin  (SINEQUAN) 25 MG capsule TAKE 1 CAPSULE BY MOUTH DAILY AT BEDTIME 04/16/16   Historical Provider, MD  doxepin (SINEQUAN) 25 MG capsule TAKE 1 CAPSULE BY MOUTH DAILY AT BEDTIME 11/24/16   Tonia Ghent, MD  fentaNYL (DURAGESIC - DOSED MCG/HR) 25 MCG/HR patch Place 25 mcg onto the skin every 3 (three) days.    Historical Provider, MD  fluticasone (FLONASE) 50 MCG/ACT nasal spray Place into the nose. 03/16/15   Historical Provider, MD  hydrocortisone 2.5 % cream APPLY RECTALLY 2 TIMES DAILY 12/14/16   Tonia Ghent, MD  ibuprofen (MOTRIN IB) 200 MG tablet Take 2 tablets (400 mg total) by mouth 3 (three) times daily as needed (with food). 11/13/16   Tonia Ghent, MD  lansoprazole (PREVACID) 15 MG capsule TAKE ONE CAPSULE BY MOUTH EVERY DAY 08/01/16   Tonia Ghent, MD  montelukast (SINGULAIR) 10 MG tablet Take 10 mg by mouth. 02/21/13   Historical Provider, MD  Multiple Vitamins-Minerals (EYE VITAMINS) CAPS Take 1 capsule by mouth daily.    Historical  Provider, MD  Omega-3 Fatty Acids (FISH OIL PO) Take by mouth.    Historical Provider, MD  ondansetron (ZOFRAN) 8 MG tablet Take 8 mg by mouth every 8 (eight) hours as needed.  11/26/15   Historical Provider, MD  oxyCODONE-acetaminophen (PERCOCET) 7.5-325 MG tablet Take 1 tablet by mouth 2 (two) times daily. 04/24/16   Historical Provider, MD  polyvinyl alcohol (LIQUIFILM TEARS) 1.4 % ophthalmic solution Place 1 drop into both eyes as needed.     Historical Provider, MD  pregabalin (LYRICA) 75 MG capsule Take 75 mg by mouth 2 (two) times daily.    Historical Provider, MD  promethazine (PHENERGAN) 25 MG tablet Take 25 mg by mouth. 12/06/15   Historical Provider, MD  vitamin E 400 UNIT capsule Take by mouth.    Historical Provider, MD  Vitamins/Minerals TABS Take by mouth.    Historical Provider, MD    Allergies Tramadol; Latex; and Penicillins  Family History  Problem Relation Age of Onset  . Heart block Mother   . Stroke Mother   . Heart attack Father    . Sudden death Brother   . Heart attack Brother   . Hypertension Brother   . Esophageal cancer Brother   . Stroke Brother     Social History Social History  Substance Use Topics  . Smoking status: Never Smoker  . Smokeless tobacco: Never Used  . Alcohol use No    Review of Systems Constitutional: No fever/chills Eyes: No visual changes. ENT: No sore throat. Cardiovascular: Denies chest pain. Respiratory: Denies shortness of breath. Gastrointestinal: No abdominal pain.  No nausea, no vomiting.  No diarrhea.  No constipation. Genitourinary: Negative for dysuria. Musculoskeletal: Negative for back pain. Skin: Negative for rash. Neurological: Negative for headaches, focal weakness or numbness.  10-point ROS otherwise negative.  ____________________________________________   PHYSICAL EXAM:  VITAL SIGNS: ED Triage Vitals  Enc Vitals Group     BP 12/25/16 1456 104/85     Pulse Rate 12/25/16 1456 69     Resp 12/25/16 1456 18     Temp 12/25/16 1456 98 F (36.7 C)     Temp Source 12/25/16 1456 Oral     SpO2 12/25/16 1456 97 %     Weight 12/25/16 1456 150 lb (68 kg)     Height 12/25/16 1456 5\' 6"  (1.676 m)     Head Circumference --      Peak Flow --      Pain Score 12/25/16 1629 8     Pain Loc --      Pain Edu? --      Excl. in [place]? --     Constitutional: Alert and oriented. Well appearing and in no acute distress. Eyes: Conjunctivae are normal. PERRL. EOMI. Head: Atraumatic. Nose: No congestion/rhinnorhea. Mouth/Throat: Mucous membranes are moist.  Oropharynx non-erythematous. Neck: No stridor.   Cardiovascular: Normal rate, regular rhythm. Grossly normal heart sounds.  Good peripheral circulation. Respiratory: Normal respiratory effort.  No retractions. Lungs CTAB. Gastrointestinal: Soft and nontender. No distention. No abdominal bruits. No CVA tenderness. Genitourinary: External edges of the labia majora are irritated and crusty. There is a small area at the  bottom of the left external labia which had been bleeding but is not bleeding currently. Musculoskeletal: No lower extremity tenderness nor edema.  No joint effusions.    ____________________________________________   LABS (all labs ordered are listed, but only abnormal results are displayed)  Labs Reviewed  URINALYSIS, COMPLETE (UACMP) WITH MICROSCOPIC - Abnormal; Notable for  the following:       Result Value   Color, Urine YELLOW (*)    APPearance CLEAR (*)    Hgb urine dipstick LARGE (*)    Leukocytes, UA TRACE (*)    Squamous Epithelial / LPF 0-5 (*)    All other components within normal limits   ____________________________________________  EKG   ____________________________________________  RADIOLOGY   ____________________________________________   PROCEDURES  Procedure(s) performed: Procedures  Critical Care performed:  ____________________________________________   INITIAL IMPRESSION / ASSESSMENT AND PLAN / ED COURSE  Pertinent labs & imaging results that were available during my care of the patient were reviewed by me and considered in my medical decision making (see chart for details).  Discussed with Dr. Leonides Schanz she will come down and see the patient.      ____________________________________________   FINAL CLINICAL IMPRESSION(S) / ED DIAGNOSES  Final diagnoses:  Skin irritation      NEW MEDICATIONS STARTED DURING THIS VISIT:  New Prescriptions   No medications on file     Note:  This document was prepared using Dragon voice recognition software and may include unintentional dictation errors.    Nena Polio, MD 12/25/16 [phone]

## 2016-12-26 ENCOUNTER — Ambulatory Visit: Payer: Medicare Other | Admitting: Physical Therapy

## 2016-12-31 ENCOUNTER — Other Ambulatory Visit: Payer: Self-pay | Admitting: Neurology

## 2017-01-02 ENCOUNTER — Ambulatory Visit: Payer: Medicare Other | Admitting: Physical Therapy

## 2017-01-02 DIAGNOSIS — M25512 Pain in left shoulder: Principal | ICD-10-CM

## 2017-01-02 DIAGNOSIS — G8929 Other chronic pain: Secondary | ICD-10-CM | POA: Diagnosis not present

## 2017-01-02 DIAGNOSIS — M25511 Pain in right shoulder: Secondary | ICD-10-CM | POA: Diagnosis not present

## 2017-01-02 NOTE — Patient Instructions (Signed)
AROM bilaterally roughly symmetrical to 140 degrees. Patient reports as "medium"   PROM ER bilaterally   Sidelying ER with 1#, 2# 3 sets x 12 repetitions   Standing ER with yellow t-band at 0 degrees of abduction

## 2017-01-02 NOTE — Therapy (Signed)
[facility] [address] [place], [address] Phone: [phone]   Fax:  [phone]  Physical Therapy Treatment  Patient Details  Name: Aimee Jones MRN: [id number] Date of Birth: 02/10/1946 Referring Provider: [name] MD  Encounter Date: 01/02/2017      PT End of Session - 01/02/17 1321    Visit Number 5   Number of Visits 9   Date for PT Re-Evaluation 01/22/17   PT Start Time 1302   PT Stop Time 1340   PT Time Calculation (min) 38 min   Activity Tolerance Patient tolerated treatment well   Behavior During Therapy [facility] for tasks assessed/performed      Past Medical History:  Diagnosis Date  . Allergy   . Anxiety    panic attacks- on occas.   . Arthritis    osteoporosis - especially hips.   . Asthma   . Confusion   . Dementia   . Depression   . Diverticulosis   . ETOH abuse   . Falls   . GERD (gastroesophageal reflux disease)   . Hyperlipidemia   . Hypertension   . Hyponatremia 07/05/2015  . Memory loss   . Normal 24 hour ambulatory pH monitoring study    good acid suppression  . Observed seizure-like activity ([place]) 07/05/2015   observed by husband , Dr Doy Mince  . Shortness of breath   . Stroke [facility])     Past Surgical History:  Procedure Laterality Date  . ABDOMINAL HYSTERECTOMY    . BREAST SURGERY     3x- biposies   . COLONOSCOPY WITH PROPOFOL N/A 08/05/2015   Procedure: COLONOSCOPY WITH PROPOFOL;  Surgeon: [name], MD;  Location: [facility];  Service: Endoscopy;  Laterality: N/A;  . DOBUTAMINE STRESS ECHO  07/09/2001   normal  . ESOPHAGOGASTRODUODENOSCOPY  06/11/2006   normal  . ESOPHAGOGASTRODUODENOSCOPY (EGD) WITH PROPOFOL N/A 08/05/2015   Procedure: ESOPHAGOGASTRODUODENOSCOPY (EGD) WITH PROPOFOL;  Surgeon: [name], MD;  [facility];  Service: Endoscopy;  Laterality: N/A;  . EYE SURGERY     cataracts removed, IOL- both eyes   . JOINT  REPLACEMENT     bilateral hips  . LOOP RECORDER IMPLANT N/A 10/14/2014   Procedure: LOOP RECORDER IMPLANT;  Surgeon: Evans Lance, MD;  Location: [facility] CATH LAB;  Service: Cardiovascular;  Laterality: N/A;  . NASAL SINUS SURGERY  1990  . REVERSE SHOULDER ARTHROPLASTY Right 08/13/2014   Procedure: REVERSE SHOULDER ARTHROPLASTY;  Surgeon: Marin Shutter, MD;  Location: [place];  Service: Orthopedics;  Laterality: Right;  interscalene block  . SHOULDER ARTHROSCOPY Left   . TEE WITHOUT CARDIOVERSION N/A 10/14/2014   Procedure: TRANSESOPHAGEAL ECHOCARDIOGRAM (TEE);  Surgeon: Sueanne Margarita, MD;  Location: [place];  Service: Cardiovascular;  Laterality: N/A;  . TOTAL HIP ARTHROPLASTY     left 1991/right 2008  . ULNAR NERVE TRANSPOSITION Right 08/01/2016   Procedure: RIGHT ULNAR NEUROPLASTY AT THE ELBOW;  Surgeon: Milly Jakob, MD;  Location: [place];  Service: Orthopedics;  Laterality: Right;    There were no vitals filed for this visit.      Subjective Assessment - 01/02/17 1307    Subjective Husband reports up until the past two days her shoulders have been feeling much better. It sounds like she was having some headaches the past few days, he has weaned off fentanyl patch, she had 2 rough days but as of last night and through today  it has been much better.    Patient is accompained by: Family member  Husband    Limitations Lifting;House hold activities   Diagnostic tests Old arthroscopic L shoulder sx, R reverse total shoulder sx.    Patient Stated Goals To have less pain    Currently in Pain? Other (Comment)  Patient is a poor historian and unable to accurately provide pain information.       AROM bilaterally roughly symmetrical to 140 degrees. Patient reports as "medium"   PROM ER bilaterally noted to be between 70-80 degrees bilaterally, RUE slightly more limited, though much improved with short bouts of stretching.   Sidelying ER with 1#, 2# 3 sets x 12  repetitions with PT guiding movement, educated husband on keeping hand in same plane for proper completion.   Standing ER with yellow t-band at 0 degrees of abduction -- 3 sets x 12 repetitions with PT guiding movement of hand  Push ups to TM railing x 8 for 2 sets (well tolerated)   Scaption with 1# Dbs x 8 for 2 sets (no complaints reported, appropriate technique)                            PT Education - 01/02/17 1320    Education provided Yes   Education Details Progression of HEP and PT observation of decreased pain with increased ROM.    Person(s) Educated Patient;Spouse   Methods Explanation;Demonstration;Handout   Comprehension Verbalized understanding;Returned demonstration             PT Long Term Goals - 01/02/17 1336      PT LONG TERM GOAL #1   Title Patient will report a Quick Dash score of less than 60% to demonstrate improved tolerance for ADLs.    Baseline 72.7% -- on 3/20 - 52.2%   Time 8   Period Weeks   Status Achieved     PT LONG TERM GOAL #2   Title Patient will demonstrate at least 155 degrees of active L shoulder flexion to demonstrate improved ability to complete ADLs.    Baseline 145 degrees    Time 8   Period Weeks   Status Partially Met     PT LONG TERM GOAL #3   Title Patient will report worst pain of 5/10 to demonstrate improved tolerance for ADLs.    Baseline 9/10 -- on 3/20 -- mild pain most days    Time 8   Period Weeks   Status Achieved               Plan - 01/02/17 1321    Clinical Impression Statement Per husband, patient has had tremendous improvement in terms of pain experienced day in and day out, she is not grimacing like she used to. Patient demonstrates WNL PROM into ER now and appropriate/functional flexion ROM in this session. Provided strengthening routine for ERs and scapular musculature with follow up in 3 weeks.    Rehab Potential Fair   Clinical Impairments Affecting Rehab Potential Patient  has short term memory loss and is unable to provide details on improved or worsened symptoms.    PT Frequency 1x / week   PT Duration 8 weeks   PT Treatment/Interventions ADLs/Self Care Home Management;Cryotherapy;Ultrasound;Moist Heat;Neuromuscular re-education;Patient/family education;Therapeutic exercise;Therapeutic activities;Manual techniques;Taping   PT Next Visit Plan Re-assess external rotation ROM, flexion, progress posterior shoulder strengthening.    PT Home Exercise Plan Pec stretch, passive external rotation range of motion, low  rows with red t-band    Consulted and Agree with Plan of Care Patient      Patient will benefit from skilled therapeutic intervention in order to improve the following deficits and impairments:  Pain, Impaired UE functional use, Decreased strength, Decreased range of motion  Visit Diagnosis: Chronic left shoulder pain  Chronic right shoulder pain       G-Codes - 01/10/17 1321    Functional Assessment Tool Used (Outpatient Only) Quick Dash, family report    Functional Limitation Carrying, moving and handling objects   Carrying, Moving and Handling Objects Current Status [phone]) At least 1 percent but less than 20 percent impaired, limited or restricted   Carrying, Moving and Handling Objects Goal Status (R1540) At least 1 percent but less than 20 percent impaired, limited or restricted      Problem List Patient Active Problem List   Diagnosis Date Noted  . Headache 11/14/2016  . Hearing loss 05/24/2016  . Enteritis due to Clostridium difficile 05/24/2016  . Leukocytosis 05/12/2016  . Thrombocytosis ([place]) 05/12/2016  . Cervical stenosis of spine 01/10/2016  . Loss of weight 12/06/2015  . Dysuria 10/14/2015  . Cerebrovascular accident (CVA) due to bilateral embolism of posterior cerebral arteries ([place]) 09/27/2015  . Chronic pain syndrome 09/27/2015  . Elevated blood pressure 07/07/2015  . Seizure-like activity ([place]) 07/04/2015  . Seizure  ([place]) 07/04/2015  . Depression   . GI bleed 06/24/2015  . Frequent falls   . Narcotic abuse   . Benzodiazepine abuse   . Alcohol abuse   . Alcohol use with alcohol-induced persisting dementia ([place])   . Generalized anxiety disorder   . Hypokalemia   . Hypomagnesemia   . Anemia, iron deficiency   . Urinary retention   . Altered mental status 06/22/2015  . Fall 06/22/2015  . Iron deficiency anemia 06/03/2015  . Medicare annual wellness visit, subsequent 04/23/2015  . Advance care planning 04/23/2015  . Senile purpura ([place]) 04/23/2015  . Abnormal CBC 04/23/2015  . Sleep apnea 12/11/2014  . 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 Stroke ([place])   . Cerebral thrombosis with cerebral infarction ([place]) 10/13/2014  . HLD (hyperlipidemia)   . Facial droop 10/12/2014  . CKD (chronic kidney disease), stage III 10/12/2014  . S/P shoulder replacement 08/13/2014  . Postmenopausal HRT (hormone replacement therapy) 05/31/2014  . Hyponatremia 05/28/2014  . Shoulder pain, bilateral 10/25/2012  . Parkinsonism ([place]) 08/22/2012  . MEMORY LOSS 06/22/2010  . ALCOHOL ABUSE 03/08/2010  . 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 OBSTRUCTIVE CHRONIC BRONCHITIS WITH EXACERBATION 01/02/2008  . GLUCOSE INTOLERANCE 11/15/2007  . GERD 07/25/2007  . OSTEOARTHRITIS 07/25/2007  . PANIC ATTACK 05/01/2007  . Essential hypertension 05/01/2007  . ALLERGIC RHINITIS 05/01/2007  . ASTHMA 05/01/2007  . DIVERTICULOSIS, COLON 05/01/2007  . MENOPAUSAL DISORDER 05/01/2007  . FIBROMYALGIA 05/01/2007   Royce Macadamia PT, DPT, CSCS    01-10-17, 1:51 PM  [facility] [address] [place], [address] Phone: [phone]   Fax:  [phone]  Name: Aimee Jones MRN: [id number] Date of Birth: 10/27/1945

## 2017-01-03 ENCOUNTER — Ambulatory Visit (INDEPENDENT_AMBULATORY_CARE_PROVIDER_SITE_OTHER): Payer: Medicare Other | Admitting: *Deleted

## 2017-01-03 DIAGNOSIS — M47812 Spondylosis without myelopathy or radiculopathy, cervical region: Secondary | ICD-10-CM | POA: Diagnosis not present

## 2017-01-03 DIAGNOSIS — I639 Cerebral infarction, unspecified: Secondary | ICD-10-CM

## 2017-01-03 DIAGNOSIS — G894 Chronic pain syndrome: Secondary | ICD-10-CM | POA: Diagnosis not present

## 2017-01-03 DIAGNOSIS — M5030 Other cervical disc degeneration, unspecified cervical region: Secondary | ICD-10-CM | POA: Diagnosis not present

## 2017-01-03 DIAGNOSIS — Z79891 Long term (current) use of opiate analgesic: Secondary | ICD-10-CM | POA: Diagnosis not present

## 2017-01-03 DIAGNOSIS — Z79899 Other long term (current) drug therapy: Secondary | ICD-10-CM | POA: Diagnosis not present

## 2017-01-03 DIAGNOSIS — M542 Cervicalgia: Secondary | ICD-10-CM | POA: Diagnosis not present

## 2017-01-03 NOTE — Progress Notes (Signed)
[facility] Summary Report / Loop Recorder 

## 2017-01-09 ENCOUNTER — Other Ambulatory Visit: Payer: Self-pay | Admitting: Neurology

## 2017-01-09 ENCOUNTER — Other Ambulatory Visit: Payer: Self-pay | Admitting: Family Medicine

## 2017-01-09 DIAGNOSIS — N9089 Other specified noninflammatory disorders of vulva and perineum: Secondary | ICD-10-CM | POA: Diagnosis not present

## 2017-01-10 ENCOUNTER — Telehealth: Payer: Self-pay | Admitting: Family Medicine

## 2017-01-10 ENCOUNTER — Other Ambulatory Visit: Payer: Self-pay

## 2017-01-10 ENCOUNTER — Other Ambulatory Visit: Payer: Self-pay | Admitting: Family Medicine

## 2017-01-10 NOTE — Telephone Encounter (Signed)
Received a fax from pharmacy requesting an alternative medication for Lansoprazole 15 mg and they suggest alternative Pantoprazole 20 mg. Form on your desk.

## 2017-01-10 NOTE — Telephone Encounter (Signed)
Received refill electronically Last refill 12/16/15 #90 by other MD Last office visit 09/01/16/acute

## 2017-01-11 MED ORDER — PANTOPRAZOLE SODIUM 20 MG PO TBEC: 20.0000 mg | DELAYED_RELEASE_TABLET | Freq: Every day | ORAL | 1 refills | Status: DC

## 2017-01-11 NOTE — Telephone Encounter (Signed)
Patient and her husband notified as instructed by telephone and verbalized understanding.

## 2017-01-11 NOTE — Telephone Encounter (Signed)
Sent. Thanks.   

## 2017-01-11 NOTE — Telephone Encounter (Signed)
Reasonable substitution, sent.  Thanks.  Make sure husband is aware of the change.

## 2017-01-15 LAB — CUP PACEART REMOTE DEVICE CHECK
Date Time Interrogation Session: 20180321060935
Implantable Pulse Generator Implant Date: 20151230

## 2017-01-23 ENCOUNTER — Ambulatory Visit: Payer: Medicare Other | Attending: Family Medicine | Admitting: Physical Therapy

## 2017-01-23 DIAGNOSIS — M25512 Pain in left shoulder: Secondary | ICD-10-CM | POA: Insufficient documentation

## 2017-01-23 DIAGNOSIS — G8929 Other chronic pain: Secondary | ICD-10-CM | POA: Insufficient documentation

## 2017-01-23 DIAGNOSIS — M25511 Pain in right shoulder: Secondary | ICD-10-CM | POA: Diagnosis not present

## 2017-01-23 NOTE — Therapy (Signed)
[facility] [address], [place], [address] Phone: [phone]   Fax:  [phone]  Physical Therapy Treatment  Patient Details  Name: Aimee Jones MRN: [id number] Date of Birth: [birth year] Referring Provider: [name] MD  Encounter Date: 01/23/2017      PT End of Session - 01/23/17 1504    Visit Number 6   Number of Visits 9   Date for PT Re-Evaluation 03/06/17   PT Start Time 1435   PT Stop Time 1500   PT Time Calculation (min) 25 min   Activity Tolerance Patient tolerated treatment well   Behavior During Therapy [facility] for tasks assessed/performed      Past Medical History:  Diagnosis Date  . Allergy   . Anxiety    panic attacks- on occas.   . Arthritis    osteoporosis - especially hips.   . Asthma   . Confusion   . Dementia   . Depression   . Diverticulosis   . ETOH abuse   . Falls   . GERD (gastroesophageal reflux disease)   . Hyperlipidemia   . Hypertension   . Hyponatremia 07/05/2015  . Memory loss   . Normal 24 hour ambulatory pH monitoring study    good acid suppression  . Observed seizure-like activity ([place]) 07/05/2015   observed by husband , Dr Doy Mince  . Shortness of breath   . Stroke [facility])     Past Surgical History:  Procedure Laterality Date  . ABDOMINAL HYSTERECTOMY    . BREAST SURGERY     3x- biposies   . COLONOSCOPY WITH PROPOFOL N/A 08/05/2015   Procedure: COLONOSCOPY WITH PROPOFOL;  Surgeon: [name], MD;  [facility];  Service: Endoscopy;  Laterality: N/A;  . DOBUTAMINE STRESS ECHO  07/09/2001   normal  . ESOPHAGOGASTRODUODENOSCOPY  06/11/2006   normal  . ESOPHAGOGASTRODUODENOSCOPY (EGD) WITH PROPOFOL N/A 08/05/2015   Procedure: ESOPHAGOGASTRODUODENOSCOPY (EGD) WITH PROPOFOL;  Surgeon: [name], MD;  [facility];  Service: Endoscopy;  Laterality: N/A;  . EYE SURGERY     cataracts removed, IOL- both eyes   . JOINT  REPLACEMENT     bilateral hips  . LOOP RECORDER IMPLANT N/A 10/14/2014   Procedure: LOOP RECORDER IMPLANT;  Surgeon: Evans Lance, MD;  Location: [facility] CATH LAB;  Service: Cardiovascular;  Laterality: N/A;  . NASAL SINUS SURGERY  1990  . REVERSE SHOULDER ARTHROPLASTY Right 08/13/2014   Procedure: REVERSE SHOULDER ARTHROPLASTY;  Surgeon: Marin Shutter, MD;  Location: [place];  Service: Orthopedics;  Laterality: Right;  interscalene block  . SHOULDER ARTHROSCOPY Left   . TEE WITHOUT CARDIOVERSION N/A 10/14/2014   Procedure: TRANSESOPHAGEAL ECHOCARDIOGRAM (TEE);  Surgeon: Sueanne Margarita, MD;  Location: [place];  Service: Cardiovascular;  Laterality: N/A;  . TOTAL HIP ARTHROPLASTY     left 1991/right 2008  . ULNAR NERVE TRANSPOSITION Right 08/01/2016   Procedure: RIGHT ULNAR NEUROPLASTY AT THE ELBOW;  Surgeon: Milly Jakob, MD;  Location: [place];  Service: Orthopedics;  Laterality: Right;    There were no vitals filed for this visit.      Subjective Assessment - 01/23/17 1502    Subjective Per her husband, patient had been doing much better in terms of pain control up until the last few days. He thinks they may have overdone their exercises at home. He reports until that point, she was asking for pain medication much less frequently.  Patient is accompained by: Family member  Husband    Limitations Lifting;House hold activities   Diagnostic tests Old arthroscopic L shoulder sx, R reverse total shoulder sx.    Patient Stated Goals To have less pain    Currently in Pain? Other (Comment)  Patient does not report or display any signs of pain while at rest. She expresses mild pain with OH flexion at end range, but otherwise does not demonstrate pain throughout the session.      ROM actively  150- R 145- L   ER PROM to  R- 68 L - 60  IR PROM to R - 63 L - WNL   Educated patient and husband on crossbody stretch applied to RUE as posterior capsule likely  restricted in motion as evidenced by decreased IR PROM   Educated patient and husband on prone horizontal abduction x 8 repetitions through appropriate range without weight in this session.                            PT Education - 01/23/17 1503    Education provided Yes   Education Details Modified HEP to include cross body stretch, maintained ER, wall slides, and prone horizontal abduction    Person(s) Educated Patient;Spouse   Methods Explanation;Demonstration;Handout   Comprehension Verbalized understanding;Returned demonstration             PT Long Term Goals - 01/02/17 1336      PT LONG TERM GOAL #1   Title Patient will report a Quick Dash score of less than 60% to demonstrate improved tolerance for ADLs.    Baseline 72.7% -- on 3/20 - 52.2%   Time 8   Period Weeks   Status Achieved     PT LONG TERM GOAL #2   Title Patient will demonstrate at least 155 degrees of active L shoulder flexion to demonstrate improved ability to complete ADLs.    Baseline 145 degrees    Time 8   Period Weeks   Status Partially Met     PT LONG TERM GOAL #3   Title Patient will report worst pain of 5/10 to demonstrate improved tolerance for ADLs.    Baseline 9/10 -- on 3/20 -- mild pain most days    Time 8   Period Weeks   Status Achieved               Plan - 01/23/17 1504    Clinical Impression Statement Per discussion with husband patient has progressed well with HEP to address pain up until an increase in HEP. It sounds like this is resolving, her ER is slightly limited still bilaterally, IR limited on RUE. Her ROM and pain free ROM has improved, though she appears to continue to lack strength in posterior scapular and cuff musculature. She was provided with condensed HEP to address listed deficits.    Rehab Potential Fair   Clinical Impairments Affecting Rehab Potential Patient has short term memory loss and is unable to provide details on improved or  worsened symptoms.    PT Frequency 1x / week   PT Duration 8 weeks   PT Treatment/Interventions ADLs/Self Care Home Management;Cryotherapy;Ultrasound;Moist Heat;Neuromuscular re-education;Patient/family education;Therapeutic exercise;Therapeutic activities;Manual techniques;Taping   PT Next Visit Plan Re-assess external rotation ROM, flexion, progress posterior shoulder strengthening.    PT Home Exercise Plan Pec stretch, passive external rotation range of motion, low rows with red t-band    Consulted and Agree with Plan  of Care Patient      Patient will benefit from skilled therapeutic intervention in order to improve the following deficits and impairments:  Pain, Impaired UE functional use, Decreased strength, Decreased range of motion  Visit Diagnosis: Chronic left shoulder pain  Chronic right shoulder pain     Problem List Patient Active Problem List   Diagnosis Date Noted  . Headache 11/14/2016  . Hearing loss 05/24/2016  . Enteritis due to Clostridium difficile 05/24/2016  . Leukocytosis 05/12/2016  . Thrombocytosis ([place]) 05/12/2016  . Cervical stenosis of spine 01/10/2016  . Loss of weight 12/06/2015  . Dysuria 10/14/2015  . Cerebrovascular accident (CVA) due to bilateral embolism of posterior cerebral arteries ([place]) 09/27/2015  . Chronic pain syndrome 09/27/2015  . Elevated blood pressure 07/07/2015  . Seizure-like activity ([place]) 07/04/2015  . Seizure ([place]) 07/04/2015  . Depression   . GI bleed 06/24/2015  . Frequent falls   . Narcotic abuse   . Benzodiazepine abuse   . Alcohol abuse   . Alcohol use with alcohol-induced persisting dementia ([place])   . Generalized anxiety disorder   . Hypokalemia   . Hypomagnesemia   . Anemia, iron deficiency   . Urinary retention   . Altered mental status 06/22/2015  . Fall 06/22/2015  . Iron deficiency anemia 06/03/2015  . Medicare annual wellness visit, subsequent 04/23/2015  . Advance care planning 04/23/2015  . Senile  purpura ([place]) 04/23/2015  . Abnormal CBC 04/23/2015  . Sleep apnea 12/11/2014  . 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 Stroke ([place])   . Cerebral thrombosis with cerebral infarction ([place]) 10/13/2014  . HLD (hyperlipidemia)   . Facial droop 10/12/2014  . CKD (chronic kidney disease), stage III 10/12/2014  . S/P shoulder replacement 08/13/2014  . Postmenopausal HRT (hormone replacement therapy) 05/31/2014  . Hyponatremia 05/28/2014  . Shoulder pain, bilateral 10/25/2012  . Parkinsonism ([place]) 08/22/2012  . MEMORY LOSS 06/22/2010  . ALCOHOL ABUSE 03/08/2010  . 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 OBSTRUCTIVE CHRONIC BRONCHITIS WITH EXACERBATION 01/02/2008  . GLUCOSE INTOLERANCE 11/15/2007  . GERD 07/25/2007  . OSTEOARTHRITIS 07/25/2007  . PANIC ATTACK 05/01/2007  . Essential hypertension 05/01/2007  . ALLERGIC RHINITIS 05/01/2007  . ASTHMA 05/01/2007  . DIVERTICULOSIS, COLON 05/01/2007  . MENOPAUSAL DISORDER 05/01/2007  . FIBROMYALGIA 05/01/2007   Royce Macadamia PT, DPT, CSCS    01/23/2017, 3:06 PM  [facility] [address] [place], [address] Phone: [phone]   Fax:  [phone]  Name: MARYKAY MCCLEOD MRN: [id number] Date of Birth: 08-Nov-1945

## 2017-01-24 NOTE — Progress Notes (Signed)
Agree with plan. Thanks

## 2017-01-25 ENCOUNTER — Ambulatory Visit (INDEPENDENT_AMBULATORY_CARE_PROVIDER_SITE_OTHER): Payer: Medicare Other | Admitting: Podiatry

## 2017-01-25 ENCOUNTER — Encounter: Payer: Self-pay | Admitting: Podiatry

## 2017-01-25 DIAGNOSIS — M79676 Pain in unspecified toe(s): Secondary | ICD-10-CM | POA: Diagnosis not present

## 2017-01-25 DIAGNOSIS — L600 Ingrowing nail: Secondary | ICD-10-CM

## 2017-01-25 NOTE — Patient Instructions (Signed)

## 2017-01-28 ENCOUNTER — Other Ambulatory Visit: Payer: Self-pay | Admitting: Family Medicine

## 2017-01-28 NOTE — Progress Notes (Signed)
Subjective:     Patient ID: Aimee Jones, female   DOB: 05-02-1946, 71 y.o.   MRN: [id number]  HPI patient presents with painful damaged right hallux nail that she cannot cut has been loose and makes it hard to wear shoe gear and all nails are thick and yellow brittle and she cannot cut them as they are painful   Review of Systems     Objective:   Physical Exam Neurovascular status was found to be intact with good digital perfusion and patient is found to have a deformed thick and nail right hallux that's painful when pressed and is noted to have nailbeds 2 through 5 right one through 5 left that are thick yellow brittle and painful    Assessment:     Damage right hallux nail of long-term nature along with nail disease bilateral    Plan:     H&P and conditions reviewed and today I went ahead and recommended nail removal and explained procedure and risk. Patient wants surgery and today I infiltrated the right hallux 60 mg like Marcaine mixture remove the hallux nail exposed matrix and applied phenol 3 applications 30 seconds followed by alcohol lavaged sterile dressing. I then debrided all remaining nails which was tolerated well with no iatrogenic bleeding and patient will be re-appointment for review

## 2017-01-30 ENCOUNTER — Other Ambulatory Visit: Payer: Self-pay | Admitting: Family Medicine

## 2017-01-31 DIAGNOSIS — G894 Chronic pain syndrome: Secondary | ICD-10-CM | POA: Diagnosis not present

## 2017-01-31 DIAGNOSIS — Z79899 Other long term (current) drug therapy: Secondary | ICD-10-CM | POA: Diagnosis not present

## 2017-01-31 DIAGNOSIS — M47812 Spondylosis without myelopathy or radiculopathy, cervical region: Secondary | ICD-10-CM | POA: Diagnosis not present

## 2017-01-31 DIAGNOSIS — Z79891 Long term (current) use of opiate analgesic: Secondary | ICD-10-CM | POA: Diagnosis not present

## 2017-01-31 DIAGNOSIS — M792 Neuralgia and neuritis, unspecified: Secondary | ICD-10-CM | POA: Diagnosis not present

## 2017-01-31 DIAGNOSIS — M19019 Primary osteoarthritis, unspecified shoulder: Secondary | ICD-10-CM | POA: Diagnosis not present

## 2017-02-02 ENCOUNTER — Ambulatory Visit (INDEPENDENT_AMBULATORY_CARE_PROVIDER_SITE_OTHER): Payer: Medicare Other | Admitting: *Deleted

## 2017-02-02 DIAGNOSIS — I639 Cerebral infarction, unspecified: Secondary | ICD-10-CM

## 2017-02-02 NOTE — Progress Notes (Signed)
[facility] Summary Report / Loop Recorder 

## 2017-02-05 ENCOUNTER — Other Ambulatory Visit: Payer: Self-pay | Admitting: Family Medicine

## 2017-02-05 DIAGNOSIS — M4802 Spinal stenosis, cervical region: Secondary | ICD-10-CM | POA: Diagnosis not present

## 2017-02-06 NOTE — Progress Notes (Deleted)
[place]  [phone] [phone]  ID: CING [place] OB: Sep 15, 1946  MR#: [id number]  QPY#:[id number]  Patient Care Team: Tonia Ghent, MD as PCP - General (Family Medicine) Justice Britain, MD as Consulting Physician (Orthopedic Surgery)  CHIEF COMPLAINT: Leukocytosis, iron deficiency anemia, thrombocytosis.  INTERVAL HISTORY: Patient returns to clinic today, accompanied by her husband, for repeat laboratory work and further evaluation.  She is a somewhat poor historian, and her husband helps in answering questions. She has poor short-term memory and is forgetful.  She continues to have significant chronic pain which is being managed by her primary care physician.  She continues to have persistent fatigue, but denies weakness. She reports consistent headaches, which her husband attributes to rebound from pain medications.  She has no other neurologic complaints.  She reports congestion with a current cold, denies any recent fevers. She has a good appetite, and she has had unintentional weight loss of 10 pounds over 3 months.  She reports shortness of breath, controlled with asthma inhaler, managed by her PCP.  She denies any chest pain.  She reports occasional nausea, self-resolving, and denies any vomiting, constipation, or diarrhea. She reports no hematuria or hematochezia. She has no urinary complaints. Patient offers no further specific complaints today.  REVIEW OF SYSTEMS:   Review of Systems  Constitutional: Positive for malaise/fatigue and weight loss. Negative for fever.  HENT: Positive for congestion and hearing loss.   Respiratory: Negative.   Cardiovascular: Negative.  Negative for chest pain.  Gastrointestinal: Positive for nausea. Negative for abdominal pain, blood in stool, constipation, diarrhea, melena and vomiting.  Genitourinary: Negative.   Musculoskeletal: Positive for back pain, joint pain and neck pain.  Neurological: Positive  for headaches. Negative for weakness.  Psychiatric/Behavioral: Positive for memory loss. The patient is nervous/anxious.     As per HPI. Otherwise, a complete review of systems is negative.  PAST MEDICAL HISTORY: Past Medical History:  Diagnosis Date  . Allergy   . Anxiety    panic attacks- on occas.   . Arthritis    osteoporosis - especially hips.   . Asthma   . Confusion   . Dementia   . Depression   . Diverticulosis   . ETOH abuse   . Falls   . GERD (gastroesophageal reflux disease)   . Hyperlipidemia   . Hypertension   . Hyponatremia 07/05/2015  . Memory loss   . Normal 24 hour ambulatory pH monitoring study    good acid suppression  . Observed seizure-like activity ([place]) 07/05/2015   observed by husband , Dr Doy Mince  . Shortness of breath   . Stroke [facility])     PAST SURGICAL HISTORY: Past Surgical History:  Procedure Laterality Date  . ABDOMINAL HYSTERECTOMY    . BREAST SURGERY     3x- biposies   . COLONOSCOPY WITH PROPOFOL N/A 08/05/2015   Procedure: COLONOSCOPY WITH PROPOFOL;  Surgeon: [name], MD;  Location: [facility];  Service: Endoscopy;  Laterality: N/A;  . DOBUTAMINE STRESS ECHO  07/09/2001   normal  . ESOPHAGOGASTRODUODENOSCOPY  06/11/2006   normal  . ESOPHAGOGASTRODUODENOSCOPY (EGD) WITH PROPOFOL N/A 08/05/2015   Procedure: ESOPHAGOGASTRODUODENOSCOPY (EGD) WITH PROPOFOL;  Surgeon: [name], MD;  Location: [facility];  Service: Endoscopy;  Laterality: N/A;  . EYE SURGERY     cataracts removed, IOL- both eyes   . JOINT REPLACEMENT     bilateral hips  . LOOP RECORDER IMPLANT N/A 10/14/2014   Procedure: LOOP  RECORDER IMPLANT;  Surgeon: Evans Lance, MD;  Location[facility] CATH LAB;  Service: Cardiovascular;  Laterality: N/A;  . NASAL SINUS SURGERY  1990  . REVERSE SHOULDER ARTHROPLASTY Right 08/13/2014   Procedure: REVERSE SHOULDER ARTHROPLASTY;  Surgeon: Marin Shutter, MD;  Location: [place];  Service: Orthopedics;  Laterality: Right;   interscalene block  . SHOULDER ARTHROSCOPY Left   . TEE WITHOUT CARDIOVERSION N/A 10/14/2014   Procedure: TRANSESOPHAGEAL ECHOCARDIOGRAM (TEE);  Surgeon: Sueanne Margarita, MD;  Location: [place];  Service: Cardiovascular;  Laterality: N/A;  . TOTAL HIP ARTHROPLASTY     left 1991/right 2008  . ULNAR NERVE TRANSPOSITION Right 08/01/2016   Procedure: RIGHT ULNAR NEUROPLASTY AT THE ELBOW;  Surgeon: Milly Jakob, MD;  Location: [place];  Service: Orthopedics;  Laterality: Right;    FAMILY HISTORY Family History  Problem Relation Age of Onset  . Heart block Mother   . Stroke Mother   . Heart attack Father   . Sudden death Brother   . Heart attack Brother   . Hypertension Brother   . Esophageal cancer Brother   . Stroke Brother        ADVANCED DIRECTIVES:    HEALTH MAINTENANCE: Social History  Substance Use Topics  . Smoking status: Never Smoker  . Smokeless tobacco: Never Used  . Alcohol use No     Allergies  Allergen Reactions  . Tramadol Other (See Comments)    Would avoid.  History of seizure on medication  . Latex Rash  . Penicillins Rash    .Marland KitchenHas patient had a PCN reaction causing immediate rash, facial/tongue/throat swelling, SOB or lightheadedness with hypotension: No Has patient had a PCN reaction causing severe rash involving mucus membranes or skin necrosis: No Has patient had a PCN reaction that required hospitalization No Has patient had a PCN reaction occurring within the last 10 years: No If all of the above answers are "NO", then may proceed with Cephalosporin use.      Current Outpatient Prescriptions  Medication Sig Dispense Refill  . acetaminophen (TYLENOL) 325 MG tablet Take 2 tablets (650 mg total) by mouth 3 (three) times daily as needed.    Marland Kitchen amLODipine (NORVASC) 2.5 MG tablet TAKE 1 TABLET BY MOUTH DAILY 30 tablet 5  . atorvastatin (LIPITOR) 10 MG tablet TAKE 1 TABLET BY MOUTH EVERY DAY 90 tablet 1  . citalopram (CELEXA)  20 MG tablet TAKE 1 TABLET BY MOUTH EVERY DAY 90 tablet 1  . clopidogrel (PLAVIX) 75 MG tablet TAKE 1 TABLET (75 MG TOTAL) BY MOUTH DAILY.    Marland Kitchen clopidogrel (PLAVIX) 75 MG tablet TAKE 1 TABLET (75 MG TOTAL) BY MOUTH DAILY. 90 tablet 1  . donepezil (ARICEPT) 10 MG tablet TAKE 1 TABLET BY MOUTH DAILY AT BEDTIME 90 tablet 1  . doxepin (SINEQUAN) 25 MG capsule TAKE 1 CAPSULE BY MOUTH DAILY AT BEDTIME  5  . doxepin (SINEQUAN) 25 MG capsule TAKE 1 CAPSULE BY MOUTH DAILY AT BEDTIME 90 capsule 1  . fentaNYL (DURAGESIC - DOSED MCG/HR) 25 MCG/HR patch Place 25 mcg onto the skin every 3 (three) days.    . fluticasone (FLONASE) 50 MCG/ACT nasal spray Place into the nose.    . hydrocortisone 2.5 % cream APPLY RECTALLY 2 TIMES DAILY 28.35 g 0  . ibuprofen (MOTRIN IB) 200 MG tablet Take 2 tablets (400 mg total) by mouth 3 (three) times daily as needed (with food).    . montelukast (SINGULAIR) 10 MG tablet Take  10 mg by mouth.    . Multiple Vitamins-Minerals (EYE VITAMINS) CAPS Take 1 capsule by mouth daily.    . Omega-3 Fatty Acids (FISH OIL PO) Take by mouth.    . ondansetron (ZOFRAN) 8 MG tablet Take 8 mg by mouth every 8 (eight) hours as needed.     Marland Kitchen oxyCODONE-acetaminophen (PERCOCET) 7.5-325 MG tablet Take 1 tablet by mouth 2 (two) times daily.  0  . pantoprazole (PROTONIX) 20 MG tablet Take 1 tablet (20 mg total) by mouth daily. 90 tablet 1  . polyvinyl alcohol (LIQUIFILM TEARS) 1.4 % ophthalmic solution Place 1 drop into both eyes as needed.     . pregabalin (LYRICA) 75 MG capsule Take 75 mg by mouth 2 (two) times daily.    . promethazine (PHENERGAN) 25 MG tablet Take 25 mg by mouth.    . SYMBICORT 160-4.5 MCG/ACT inhaler INHALE 2 PUFFS INTO THE LUNGS 2 (TWO) TIMES DAILY AS NEEDED (SHORTNESS OF BREATH). 10.2 Inhaler 7  . vitamin E 400 UNIT capsule Take by mouth.    . Vitamins/Minerals TABS Take by mouth.     No current facility-administered medications for this visit.     OBJECTIVE: There were no  vitals filed for this visit.   There is no height or weight on file to calculate BMI.    ECOG FS:2 - Symptomatic, <50% confined to bed  General: Very pale, well-developed, well-nourished, no acute distress. Eyes: Pink conjunctiva, anicteric sclera, puffiness under bilateral eyes Lungs: Diminished breath sounds bilaterally. Heart: Regular rate and rhythm. No rubs, murmurs, or gallops. Abdomen: Soft, nontender, nondistended. No organomegaly noted, normoactive bowel sounds. Musculoskeletal: No edema, cyanosis, or clubbing. Neuro: Alert, occasionally unable to follow conversation, slow responses. Cranial nerves grossly intact. Skin: No rashes or petechiae noted. Psych: Normal affect. Short-term memory loss.   LAB RESULTS:  Lab Results  Component Value Date   NA 129 (L) 05/19/2016   K 4.3 05/19/2016   CL 97 05/19/2016   CO2 27 05/19/2016   GLUCOSE 91 05/19/2016   BUN 17 05/19/2016   CREATININE 0.91 05/19/2016   CALCIUM 9.7 05/19/2016   PROT 6.4 05/19/2016   ALBUMIN 3.6 05/19/2016   AST 22 05/19/2016   ALT 20 05/19/2016   ALKPHOS 71 05/19/2016   BILITOT 0.2 05/19/2016   GFRNONAA 53 (L) 07/08/2015   GFRAA >60 07/08/2015    Lab Results  Component Value Date   WBC 11.8 (H) 11/09/2016   NEUTROABS 7.8 (H) 11/09/2016   HGB 12.9 11/09/2016   HCT 39.3 11/09/2016   MCV 81.5 11/09/2016   PLT 605 (H) 11/09/2016   Lab Results  Component Value Date   IRON 57 11/09/2016   TIBC 236 (L) 11/09/2016   IRONPCTSAT 24 11/09/2016    Lab Results  Component Value Date   FERRITIN 80 11/09/2016     STUDIES: No results found.  ASSESSMENT: Leukocytosis, iron deficiency anemia, thrombocytosis.  PLAN:    1. Iron deficiency anemia: Patient's hemoglobin and iron stores are within normal limits today and she will not need any IV iron today. Previously, the remainder of her laboratory work was also negative or within normal limits. Colonoscopy and EGD in October 2016 were unrevealing. Return  to clinic in 3 months with repeat laboratory work and further evaluation.  2. Leukocytosis: Mildly elevated today. Peripheral blood flow cytometry as well as BCR-ABL mutation are negative. Monitor. 3. Thrombocytosis: Platelets 605 today. JAK-2 mutation negative. Monitor. 4. Hypertension: Resolved. Patient's blood pressure is normal today. Continue  current medications. 5. Pain: Continue treatment per primary care and pain clinic. 6. Shortness of breath: Continue treatment per primary care.  Patient expressed understanding and was in agreement with this plan. She also understands that She can call clinic at any time with any questions, concerns, or complaints.    Lucendia Herrlich, NP  02/06/17 11:22 PM   Patient was seen and evaluated independently and I agree with the assessment and plan as dictated above.  Lloyd Huger, MD 02/06/17 11:22 PM

## 2017-02-07 ENCOUNTER — Inpatient Hospital Stay: Payer: Medicare Other

## 2017-02-07 ENCOUNTER — Inpatient Hospital Stay: Payer: Medicare Other | Admitting: Oncology

## 2017-02-08 LAB — CUP PACEART REMOTE DEVICE CHECK
Date Time Interrogation Session: 20180420060923
Implantable Pulse Generator Implant Date: 20151230

## 2017-02-12 NOTE — Progress Notes (Signed)
[place]  [phone] [phone]  ID: Aimee Jones OB: [birth year]  MR#: [id number]  PXT#:[id number]  Patient Care Team: Tonia Ghent, MD as PCP - General (Family Medicine) Justice Britain, MD as Consulting Physician (Orthopedic Surgery)  CHIEF COMPLAINT: Leukocytosis, iron deficiency anemia, thrombocytosis.  INTERVAL HISTORY: Patient returns to clinic today for further evaluation and consideration of additional IV iron. Given her ongoing dimension, she is a poor historian and much of her questions are answered by her husband. She currently feels well and is at her baseline. She has no neurological complaints, but does admit to occasional headaches. She has a fair appetite, but denies weight loss. She has no chest pain or shortness of breath. She continues to have chronic weakness and fatigue. She denies any nausea, vomiting, constipation, or diarrhea. She has no melena or hematochezia. She has no urinary complaints. Patient offers no further specific complaints today.  REVIEW OF SYSTEMS:   Review of Systems  Constitutional: Positive for malaise/fatigue. Negative for fever and weight loss.  HENT: Negative.  Negative for congestion and hearing loss.   Respiratory: Negative.  Negative for cough and shortness of breath.   Cardiovascular: Negative.  Negative for chest pain and leg swelling.  Gastrointestinal: Negative for abdominal pain, blood in stool, constipation, diarrhea, melena, nausea and vomiting.  Genitourinary: Negative.   Musculoskeletal: Negative.  Negative for back pain, joint pain and neck pain.  Skin: Negative.  Negative for rash.  Neurological: Positive for headaches. Negative for weakness.  Psychiatric/Behavioral: Positive for memory loss. The patient is nervous/anxious.     As per HPI. Otherwise, a complete review of systems is negative.  PAST MEDICAL HISTORY: Past Medical History:  Diagnosis Date  . Allergy   . Anxiety    panic attacks- on occas.   . Arthritis    osteoporosis - especially hips.   . Asthma   . Confusion   . Dementia   . Depression   . Diverticulosis   . ETOH abuse   . Falls   . GERD (gastroesophageal reflux disease)   . Hyperlipidemia   . Hypertension   . Hyponatremia 07/05/2015  . Memory loss   . Normal 24 hour ambulatory pH monitoring study    good acid suppression  . Observed seizure-like activity ([place]) 07/05/2015   observed by husband , Dr Doy Mince  . Shortness of breath   . Stroke [facility])     PAST SURGICAL HISTORY: Past Surgical History:  Procedure Laterality Date  . ABDOMINAL HYSTERECTOMY    . BREAST SURGERY     3x- biposies   . COLONOSCOPY WITH PROPOFOL N/A 08/05/2015   Procedure: COLONOSCOPY WITH PROPOFOL;  Surgeon: [name], MD;  [facility];  Service: Endoscopy;  Laterality: N/A;  . DOBUTAMINE STRESS ECHO  07/09/2001   normal  . ESOPHAGOGASTRODUODENOSCOPY  06/11/2006   normal  . ESOPHAGOGASTRODUODENOSCOPY (EGD) WITH PROPOFOL N/A 08/05/2015   Procedure: ESOPHAGOGASTRODUODENOSCOPY (EGD) WITH PROPOFOL;  Surgeon: [name], MD;  [facility];  Service: Endoscopy;  Laterality: N/A;  . EYE SURGERY     cataracts removed, IOL- both eyes   . JOINT REPLACEMENT     bilateral hips  . LOOP RECORDER IMPLANT N/A 10/14/2014   Procedure: LOOP RECORDER IMPLANT;  Surgeon: Evans Lance, MD;  Location: [facility] CATH LAB;  Service: Cardiovascular;  Laterality: N/A;  . NASAL SINUS SURGERY  1990  . REVERSE SHOULDER ARTHROPLASTY Right 08/13/2014   Procedure: REVERSE SHOULDER ARTHROPLASTY;  Surgeon: Lennette Bihari  Sundra Aland, MD;  Location: [place];  Service: Orthopedics;  Laterality: Right;  interscalene block  . SHOULDER ARTHROSCOPY Left   . TEE WITHOUT CARDIOVERSION N/A 10/14/2014   Procedure: TRANSESOPHAGEAL ECHOCARDIOGRAM (TEE);  Surgeon: Sueanne Margarita, MD;  Location: [place];  Service: Cardiovascular;  Laterality: N/A;  . TOTAL HIP ARTHROPLASTY     left  1991/right 2008  . ULNAR NERVE TRANSPOSITION Right 08/01/2016   Procedure: RIGHT ULNAR NEUROPLASTY AT THE ELBOW;  Surgeon: Milly Jakob, MD;  Location: [place];  Service: Orthopedics;  Laterality: Right;    FAMILY HISTORY Family History  Problem Relation Age of Onset  . Heart block Mother   . Stroke Mother   . Heart attack Father   . Sudden death Brother   . Heart attack Brother   . Hypertension Brother   . Esophageal cancer Brother   . Stroke Brother        ADVANCED DIRECTIVES:    HEALTH MAINTENANCE: Social History  Substance Use Topics  . Smoking status: Never Smoker  . Smokeless tobacco: Never Used  . Alcohol use No     Allergies  Allergen Reactions  . Tramadol Other (See Comments)    Would avoid.  History of seizure on medication  . Latex Rash  . Penicillins Rash    .Marland KitchenHas patient had a PCN reaction causing immediate rash, facial/tongue/throat swelling, SOB or lightheadedness with hypotension: No Has patient had a PCN reaction causing severe rash involving mucus membranes or skin necrosis: No Has patient had a PCN reaction that required hospitalization No Has patient had a PCN reaction occurring within the last 10 years: No If all of the above answers are "NO", then may proceed with Cephalosporin use.      Current Outpatient Prescriptions  Medication Sig Dispense Refill  . acetaminophen (TYLENOL) 325 MG tablet Take 2 tablets (650 mg total) by mouth 3 (three) times daily as needed.    Marland Kitchen amLODipine (NORVASC) 2.5 MG tablet TAKE 1 TABLET BY MOUTH DAILY 30 tablet 5  . atorvastatin (LIPITOR) 10 MG tablet TAKE 1 TABLET BY MOUTH EVERY DAY 90 tablet 1  . citalopram (CELEXA) 20 MG tablet TAKE 1 TABLET BY MOUTH EVERY DAY 90 tablet 1  . clopidogrel (PLAVIX) 75 MG tablet TAKE 1 TABLET (75 MG TOTAL) BY MOUTH DAILY.    Marland Kitchen donepezil (ARICEPT) 10 MG tablet TAKE 1 TABLET BY MOUTH DAILY AT BEDTIME 90 tablet 1  . doxepin (SINEQUAN) 25 MG capsule TAKE 1 CAPSULE  BY MOUTH DAILY AT BEDTIME  5  . fentaNYL (DURAGESIC - DOSED MCG/HR) 25 MCG/HR patch Place 25 mcg onto the skin every 3 (three) days.    . fluticasone (FLONASE) 50 MCG/ACT nasal spray Place into the nose.    . hydrocortisone 2.5 % cream APPLY RECTALLY 2 TIMES DAILY 28.35 g 0  . ibuprofen (MOTRIN IB) 200 MG tablet Take 2 tablets (400 mg total) by mouth 3 (three) times daily as needed (with food).    . lansoprazole (PREVACID) 15 MG capsule Take by mouth.    . montelukast (SINGULAIR) 10 MG tablet Take 10 mg by mouth.    . Multiple Vitamins-Minerals (EYE VITAMINS) CAPS Take 1 capsule by mouth daily.    . Omega-3 Fatty Acids (FISH OIL PO) Take by mouth.    . ondansetron (ZOFRAN) 8 MG tablet Take 8 mg by mouth every 8 (eight) hours as needed.     Marland Kitchen oxyCODONE-acetaminophen (PERCOCET) 7.5-325 MG tablet Take 1 tablet by mouth  2 (two) times daily.  0  . pantoprazole (PROTONIX) 20 MG tablet Take 1 tablet (20 mg total) by mouth daily. 90 tablet 1  . polyvinyl alcohol (LIQUIFILM TEARS) 1.4 % ophthalmic solution Place 1 drop into both eyes as needed.     . pregabalin (LYRICA) 75 MG capsule Take 75 mg by mouth 2 (two) times daily.    . promethazine (PHENERGAN) 25 MG tablet Take 25 mg by mouth.    . SYMBICORT 160-4.5 MCG/ACT inhaler INHALE 2 PUFFS INTO THE LUNGS 2 (TWO) TIMES DAILY AS NEEDED (SHORTNESS OF BREATH). 10.2 Inhaler 7  . vitamin E 400 UNIT capsule Take by mouth.    . Vitamins/Minerals TABS Take by mouth.     No current facility-administered medications for this visit.     OBJECTIVE: Vitals:   02/14/17 1147  BP: 118/74  Pulse: 69  Temp: 97.2 F (36.2 C)     Body mass index is 23.01 kg/m.    ECOG FS:2 - Symptomatic, <50% confined to bed  General: Very pale, well-developed, well-nourished, no acute distress. Eyes: Pink conjunctiva, anicteric sclera, puffiness under bilateral eyes Lungs: Diminished breath sounds bilaterally. Heart: Regular rate and rhythm. No rubs, murmurs, or  gallops. Abdomen: Soft, nontender, nondistended. No organomegaly noted, normoactive bowel sounds. Musculoskeletal: No edema, cyanosis, or clubbing. Neuro: Alert, occasionally unable to follow conversation, slow responses. Cranial nerves grossly intact. Skin: No rashes or petechiae noted. Psych: Normal affect. Short-term memory loss.   LAB RESULTS:  Lab Results  Component Value Date   NA 129 (L) 05/19/2016   K 4.3 05/19/2016   CL 97 05/19/2016   CO2 27 05/19/2016   GLUCOSE 91 05/19/2016   BUN 17 05/19/2016   CREATININE 0.91 05/19/2016   CALCIUM 9.7 05/19/2016   PROT 6.4 05/19/2016   ALBUMIN 3.6 05/19/2016   AST 22 05/19/2016   ALT 20 05/19/2016   ALKPHOS 71 05/19/2016   BILITOT 0.2 05/19/2016   GFRNONAA 53 (L) 07/08/2015   GFRAA >60 07/08/2015    Lab Results  Component Value Date   WBC 7.6 02/14/2017   NEUTROABS 4.1 02/14/2017   HGB 13.0 02/14/2017   HCT 39.3 02/14/2017   MCV 81.8 02/14/2017   PLT 694 (H) 02/14/2017   Lab Results  Component Value Date   IRON 63 02/14/2017   TIBC 281 02/14/2017   IRONPCTSAT 22 02/14/2017    Lab Results  Component Value Date   FERRITIN 69 02/14/2017     STUDIES: No results found.  ASSESSMENT: Leukocytosis, iron deficiency anemia, thrombocytosis.  PLAN:    1. Iron deficiency anemia: Patient's hemoglobin and iron stores Continue to be within normal limits. Previously, the remainder of her laboratory work was also negative or within normal limits. Colonoscopy and EGD in October 2016 were unrevealing. She does not require IV iron today. Patient last received 510 mg IV Feraheme on May 16, 2016. Patient's and her husband continue to request less frequent follow-up, therefore she will return to clinic in 6 months with repeat laboratory work and further evaluation.  2. Leukocytosis: Resolved. Peripheral blood flow cytometry as well as BCR-ABL mutation are negative. Monitor. 3. Thrombocytosis: Platelets greater than 600 today. JAK-2  mutation negative. Monitor. 4. Hypertension: Resolved. Patient's blood pressure is normal today. Continue current medications.   Patient expressed understanding and was in agreement with this plan. She also understands that She can call clinic at any time with any questions, concerns, or complaints.    Lloyd Huger, MD 02/16/17 11:02 AM

## 2017-02-14 ENCOUNTER — Inpatient Hospital Stay: Payer: Medicare Other

## 2017-02-14 ENCOUNTER — Inpatient Hospital Stay: Payer: Medicare Other | Attending: Oncology

## 2017-02-14 ENCOUNTER — Inpatient Hospital Stay (HOSPITAL_BASED_OUTPATIENT_CLINIC_OR_DEPARTMENT_OTHER): Payer: Medicare Other | Admitting: Oncology

## 2017-02-14 VITALS — BP 118/74 | HR 69 | Temp 97.2°F | Ht 67.0 in | Wt 146.9 lb

## 2017-02-14 DIAGNOSIS — R5382 Chronic fatigue, unspecified: Secondary | ICD-10-CM | POA: Insufficient documentation

## 2017-02-14 DIAGNOSIS — Z8673 Personal history of transient ischemic attack (TIA), and cerebral infarction without residual deficits: Secondary | ICD-10-CM

## 2017-02-14 DIAGNOSIS — F419 Anxiety disorder, unspecified: Secondary | ICD-10-CM | POA: Diagnosis not present

## 2017-02-14 DIAGNOSIS — Z79899 Other long term (current) drug therapy: Secondary | ICD-10-CM

## 2017-02-14 DIAGNOSIS — F0390 Unspecified dementia without behavioral disturbance: Secondary | ICD-10-CM | POA: Insufficient documentation

## 2017-02-14 DIAGNOSIS — R51 Headache: Secondary | ICD-10-CM

## 2017-02-14 DIAGNOSIS — D75839 Thrombocytosis, unspecified: Secondary | ICD-10-CM

## 2017-02-14 DIAGNOSIS — D508 Other iron deficiency anemias: Secondary | ICD-10-CM

## 2017-02-14 DIAGNOSIS — D509 Iron deficiency anemia, unspecified: Secondary | ICD-10-CM | POA: Diagnosis not present

## 2017-02-14 DIAGNOSIS — R531 Weakness: Secondary | ICD-10-CM

## 2017-02-14 DIAGNOSIS — I10 Essential (primary) hypertension: Secondary | ICD-10-CM | POA: Diagnosis not present

## 2017-02-14 DIAGNOSIS — E785 Hyperlipidemia, unspecified: Secondary | ICD-10-CM | POA: Diagnosis not present

## 2017-02-14 DIAGNOSIS — R5381 Other malaise: Secondary | ICD-10-CM | POA: Diagnosis not present

## 2017-02-14 DIAGNOSIS — D473 Essential (hemorrhagic) thrombocythemia: Secondary | ICD-10-CM

## 2017-02-14 DIAGNOSIS — K219 Gastro-esophageal reflux disease without esophagitis: Secondary | ICD-10-CM

## 2017-02-14 DIAGNOSIS — Z880 Allergy status to penicillin: Secondary | ICD-10-CM | POA: Diagnosis not present

## 2017-02-14 DIAGNOSIS — D72829 Elevated white blood cell count, unspecified: Secondary | ICD-10-CM | POA: Diagnosis not present

## 2017-02-14 LAB — FERRITIN: FERRITIN: 69 ng/mL (ref 11–307)

## 2017-02-14 LAB — CBC WITH DIFFERENTIAL/PLATELET
Basophils Absolute: 0.2 10*3/uL — ABNORMAL HIGH (ref 0–0.1)
Basophils Relative: 2 %
EOS ABS: 0.9 10*3/uL — AB (ref 0–0.7)
EOS PCT: 11 %
HCT: 39.3 % (ref 35.0–47.0)
Hemoglobin: 13 g/dL (ref 12.0–16.0)
LYMPHS PCT: 26 %
Lymphs Abs: 2 10*3/uL (ref 1.0–3.6)
MCH: 27 pg (ref 26.0–34.0)
MCHC: 33.1 g/dL (ref 32.0–36.0)
MCV: 81.8 fL (ref 80.0–100.0)
Monocytes Absolute: 0.5 10*3/uL (ref 0.2–0.9)
Monocytes Relative: 7 %
Neutro Abs: 4.1 10*3/uL (ref 1.4–6.5)
Neutrophils Relative %: 54 %
PLATELETS: 694 10*3/uL — AB (ref 150–440)
RBC: 4.81 MIL/uL (ref 3.80–5.20)
RDW: 16.6 % — ABNORMAL HIGH (ref 11.5–14.5)
WBC: 7.6 10*3/uL (ref 3.6–11.0)

## 2017-02-14 LAB — IRON AND TIBC
Iron: 63 ug/dL (ref 28–170)
Saturation Ratios: 22 % (ref 10.4–31.8)
TIBC: 281 ug/dL (ref 250–450)
UIBC: 218 ug/dL

## 2017-02-14 NOTE — Progress Notes (Signed)
Patient here for follow up no changes since last appt 

## 2017-02-21 ENCOUNTER — Other Ambulatory Visit: Payer: Self-pay | Admitting: Family Medicine

## 2017-02-21 ENCOUNTER — Other Ambulatory Visit: Payer: Self-pay | Admitting: Gastroenterology

## 2017-02-28 DIAGNOSIS — M47812 Spondylosis without myelopathy or radiculopathy, cervical region: Secondary | ICD-10-CM | POA: Diagnosis not present

## 2017-02-28 DIAGNOSIS — Z79899 Other long term (current) drug therapy: Secondary | ICD-10-CM | POA: Diagnosis not present

## 2017-02-28 DIAGNOSIS — Z79891 Long term (current) use of opiate analgesic: Secondary | ICD-10-CM | POA: Diagnosis not present

## 2017-02-28 DIAGNOSIS — M792 Neuralgia and neuritis, unspecified: Secondary | ICD-10-CM | POA: Diagnosis not present

## 2017-02-28 DIAGNOSIS — M19019 Primary osteoarthritis, unspecified shoulder: Secondary | ICD-10-CM | POA: Diagnosis not present

## 2017-02-28 DIAGNOSIS — G894 Chronic pain syndrome: Secondary | ICD-10-CM | POA: Diagnosis not present

## 2017-03-02 ENCOUNTER — Encounter: Payer: Self-pay | Admitting: Family Medicine

## 2017-03-02 ENCOUNTER — Ambulatory Visit (INDEPENDENT_AMBULATORY_CARE_PROVIDER_SITE_OTHER): Payer: Medicare Other | Admitting: Family Medicine

## 2017-03-02 VITALS — BP 108/62 | HR 86 | Temp 97.5°F | Wt 151.2 lb

## 2017-03-02 DIAGNOSIS — I639 Cerebral infarction, unspecified: Secondary | ICD-10-CM | POA: Diagnosis not present

## 2017-03-02 DIAGNOSIS — R82998 Other abnormal findings in urine: Secondary | ICD-10-CM | POA: Insufficient documentation

## 2017-03-02 DIAGNOSIS — R8299 Other abnormal findings in urine: Secondary | ICD-10-CM

## 2017-03-02 LAB — POC URINALSYSI DIPSTICK (AUTOMATED)
Bilirubin, UA: NEGATIVE
Glucose, UA: NEGATIVE
KETONES UA: NEGATIVE
NITRITE UA: NEGATIVE
PH UA: 6 (ref 5.0–8.0)
PROTEIN UA: NEGATIVE
RBC UA: NEGATIVE
Spec Grav, UA: 1.02 (ref 1.010–1.025)
Urobilinogen, UA: 0.2 E.U./dL

## 2017-03-02 NOTE — Assessment & Plan Note (Signed)
Hold on abx at this point w/o dysuria.  Check ucx, rationale d/w pt and husband.  Okay for outpatient f/u. See AVS.

## 2017-03-02 NOTE — Patient Instructions (Addendum)
Drink plenty of water and we'll contact you with your lab report.  Take care.

## 2017-03-02 NOTE — Progress Notes (Signed)
Dysuria: no duration of symptoms: 2 days- was noted to have urinary changes when at the pain clinic earlier this week.   abdominal pain:no Fevers: no back pain: at baseline Vomiting: no U/a d/w pt.  ucx pending.  Hx per patient and husband.   Meds, vitals, and allergies reviewed.   Per HPI unless specifically indicated in ROS section   GEN: nad, alert and pleasant, she repeats herself in conversation HEENT: mucous membranes moist NECK: supple CV: rrr.  PULM: ctab, no inc wob ABD: soft, +bs, suprapubic area not tender EXT: no edema BACK: no CVA pain

## 2017-03-05 ENCOUNTER — Ambulatory Visit (INDEPENDENT_AMBULATORY_CARE_PROVIDER_SITE_OTHER): Payer: Medicare Other | Admitting: *Deleted

## 2017-03-05 ENCOUNTER — Other Ambulatory Visit: Payer: Self-pay | Admitting: Family Medicine

## 2017-03-05 DIAGNOSIS — I639 Cerebral infarction, unspecified: Secondary | ICD-10-CM | POA: Diagnosis not present

## 2017-03-05 LAB — URINE CULTURE

## 2017-03-05 NOTE — Progress Notes (Signed)
[facility] Summary Report / Loop Recorder 

## 2017-03-05 NOTE — Progress Notes (Signed)
See ucx report if needed.   ucx pos.    See if patient has any urinary sx- dysuria, pain, fever, etc.  If any sx, then start septra DS- okay to send as ordered (1 tab BID x5 days).    Thanks.   GSD.      Husband advised and says patient is having no symptoms at all but will call if anything changes.  Mike Craze, CMA  03/05/17   Noted.  Thanks. Agree.  Will await update from them if sx noted.   GSD.

## 2017-03-08 LAB — CUP PACEART REMOTE DEVICE CHECK
Date Time Interrogation Session: 20180520063654
MDC IDC PG IMPLANT DT: 20151230

## 2017-03-27 DIAGNOSIS — Z79899 Other long term (current) drug therapy: Secondary | ICD-10-CM | POA: Diagnosis not present

## 2017-03-27 DIAGNOSIS — M19019 Primary osteoarthritis, unspecified shoulder: Secondary | ICD-10-CM | POA: Diagnosis not present

## 2017-03-27 DIAGNOSIS — M792 Neuralgia and neuritis, unspecified: Secondary | ICD-10-CM | POA: Diagnosis not present

## 2017-03-27 DIAGNOSIS — G894 Chronic pain syndrome: Secondary | ICD-10-CM | POA: Diagnosis not present

## 2017-03-27 DIAGNOSIS — Z79891 Long term (current) use of opiate analgesic: Secondary | ICD-10-CM | POA: Diagnosis not present

## 2017-03-27 DIAGNOSIS — M47812 Spondylosis without myelopathy or radiculopathy, cervical region: Secondary | ICD-10-CM | POA: Diagnosis not present

## 2017-04-03 ENCOUNTER — Ambulatory Visit (INDEPENDENT_AMBULATORY_CARE_PROVIDER_SITE_OTHER): Payer: Medicare Other | Admitting: *Deleted

## 2017-04-03 DIAGNOSIS — I639 Cerebral infarction, unspecified: Secondary | ICD-10-CM | POA: Diagnosis not present

## 2017-04-03 NOTE — Progress Notes (Signed)
[facility] Summary Report / Loop Recorder 

## 2017-04-11 ENCOUNTER — Emergency Department
Admission: EM | Admit: 2017-04-11 | Discharge: 2017-04-12 | Disposition: A | Discharge status: Home / Self Care | Payer: Medicare Other | Attending: Emergency Medicine | Admitting: Emergency Medicine

## 2017-04-11 ENCOUNTER — Emergency Department: Payer: Medicare Other

## 2017-04-11 DIAGNOSIS — I129 Hypertensive chronic kidney disease with stage 1 through stage 4 chronic kidney disease, or unspecified chronic kidney disease: Secondary | ICD-10-CM | POA: Insufficient documentation

## 2017-04-11 DIAGNOSIS — J45909 Unspecified asthma, uncomplicated: Secondary | ICD-10-CM | POA: Insufficient documentation

## 2017-04-11 DIAGNOSIS — Z7902 Long term (current) use of antithrombotics/antiplatelets: Secondary | ICD-10-CM | POA: Diagnosis not present

## 2017-04-11 DIAGNOSIS — G8929 Other chronic pain: Secondary | ICD-10-CM | POA: Diagnosis not present

## 2017-04-11 DIAGNOSIS — N3000 Acute cystitis without hematuria: Secondary | ICD-10-CM | POA: Diagnosis not present

## 2017-04-11 DIAGNOSIS — N3090 Cystitis, unspecified without hematuria: Secondary | ICD-10-CM | POA: Insufficient documentation

## 2017-04-11 DIAGNOSIS — S0990XA Unspecified injury of head, initial encounter: Secondary | ICD-10-CM | POA: Diagnosis not present

## 2017-04-11 DIAGNOSIS — E871 Hypo-osmolality and hyponatremia: Secondary | ICD-10-CM | POA: Insufficient documentation

## 2017-04-11 DIAGNOSIS — G20 Parkinson's disease: Secondary | ICD-10-CM | POA: Insufficient documentation

## 2017-04-11 DIAGNOSIS — R410 Disorientation, unspecified: Secondary | ICD-10-CM | POA: Diagnosis present

## 2017-04-11 DIAGNOSIS — N182 Chronic kidney disease, stage 2 (mild): Secondary | ICD-10-CM | POA: Diagnosis not present

## 2017-04-11 DIAGNOSIS — Z79891 Long term (current) use of opiate analgesic: Secondary | ICD-10-CM | POA: Insufficient documentation

## 2017-04-11 DIAGNOSIS — Z79899 Other long term (current) drug therapy: Secondary | ICD-10-CM | POA: Insufficient documentation

## 2017-04-11 LAB — CBC
HCT: 36.4 % (ref 35.0–47.0)
HEMOGLOBIN: 11.8 g/dL — AB (ref 12.0–16.0)
MCH: 25.9 pg — ABNORMAL LOW (ref 26.0–34.0)
MCHC: 32.5 g/dL (ref 32.0–36.0)
MCV: 79.9 fL — AB (ref 80.0–100.0)
PLATELETS: 543 10*3/uL — AB (ref 150–440)
RBC: 4.55 MIL/uL (ref 3.80–5.20)
RDW: 16.2 % — ABNORMAL HIGH (ref 11.5–14.5)
WBC: 8.8 10*3/uL (ref 3.6–11.0)

## 2017-04-11 LAB — CUP PACEART REMOTE DEVICE CHECK
Date Time Interrogation Session: 20180619073742
MDC IDC PG IMPLANT DT: 20151230

## 2017-04-11 MED ORDER — SODIUM CHLORIDE 0.9 % IV BOLUS (SEPSIS)
1000.0000 mL | Freq: Once | INTRAVENOUS | Status: AC
  Administered 2017-04-11: 1000 mL via INTRAVENOUS

## 2017-04-11 NOTE — ED Provider Notes (Signed)
[facility] Emergency Department Provider Note   First MD Initiated Contact with Patient 04/11/17 2320     (approximate)  I have reviewed the triage vital signs and the nursing notes.  L5 caveat: Dementia with altered mental status. History obtained from the patient's son HISTORY  Chief Complaint Altered Mental Status   HPI Aimee Jones is a 71 y.o. female with below list of chronic medical conditions including dementia presents to the emergency department with increasing levels of confusion today. Per the patient's son the patient has had increasing confusion with ADLs today. Patient's son states that he has not noticed any localized weakness or gait instability however stating that his mom would repetitively asked questions fell onto her bottom while attempting to sitting in a chair and appeared to be confused with the action of sitting.   Past Medical History:  Diagnosis Date  . Allergy   . Anxiety    panic attacks- on occas.   . Arthritis    osteoporosis - especially hips.   . Asthma   . Confusion   . Dementia   . Depression   . Diverticulosis   . ETOH abuse   . Falls   . GERD (gastroesophageal reflux disease)   . Hyperlipidemia   . Hypertension   . Hyponatremia 07/05/2015  . Memory loss   . Normal 24 hour ambulatory pH monitoring study    good acid suppression  . Observed seizure-like activity ([place]) 07/05/2015   observed by husband , Dr Doy Mince  . Shortness of breath   . Stroke [facility])     Patient Active Problem List   Diagnosis Date Noted  . Dark urine 03/02/2017  . Headache 11/14/2016  . Hearing loss 05/24/2016  . Enteritis due to Clostridium difficile 05/24/2016  . Leukocytosis 05/12/2016  . Thrombocytosis ([place]) 05/12/2016  . Cervical stenosis of spine 01/10/2016  . Loss of weight 12/06/2015  . Dysuria 10/14/2015  . Cerebrovascular accident (CVA) due to bilateral embolism of posterior cerebral arteries ([place]) 09/27/2015    . Chronic pain syndrome 09/27/2015  . Elevated blood pressure 07/07/2015  . Seizure-like activity ([place]) 07/04/2015  . Seizure ([place]) 07/04/2015  . Depression   . GI bleed 06/24/2015  . Frequent falls   . Narcotic abuse   . Benzodiazepine abuse   . Alcohol use with alcohol-induced persisting dementia ([place])   . Generalized anxiety disorder   . Hypokalemia   . Hypomagnesemia   . Anemia, iron deficiency   . Urinary retention   . Altered mental status 06/22/2015  . Fall 06/22/2015  . Iron deficiency anemia 06/03/2015  . Medicare annual wellness visit, subsequent 04/23/2015  . Advance care planning 04/23/2015  . Senile purpura ([place]) 04/23/2015  . Abnormal CBC 04/23/2015  . Sleep apnea 12/11/2014  . Dementia associated with alcoholism ([place]) 12/11/2014  . Stroke ([place])   . Cerebral thrombosis with cerebral infarction ([place]) 10/13/2014  . HLD (hyperlipidemia)   . Facial droop 10/12/2014  . CKD (chronic kidney disease), stage III 10/12/2014  . S/P shoulder replacement 08/13/2014  . Postmenopausal HRT (hormone replacement therapy) 05/31/2014  . Hyponatremia 05/28/2014  . Shoulder pain, bilateral 10/25/2012  . Parkinsonism ([place]) 08/22/2012  . MEMORY LOSS 06/22/2010  . ALCOHOL ABUSE 03/08/2010  . 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 OBSTRUCTIVE CHRONIC BRONCHITIS WITH EXACERBATION 01/02/2008  . GLUCOSE INTOLERANCE 11/15/2007  . GERD 07/25/2007  . OSTEOARTHRITIS 07/25/2007  . PANIC ATTACK 05/01/2007  . Essential hypertension 05/01/2007  . ALLERGIC RHINITIS 05/01/2007  .  ASTHMA 05/01/2007  . DIVERTICULOSIS, COLON 05/01/2007  . MENOPAUSAL DISORDER 05/01/2007  . FIBROMYALGIA 05/01/2007    Past Surgical History:  Procedure Laterality Date  . ABDOMINAL HYSTERECTOMY    . BREAST SURGERY     3x- biposies   . COLONOSCOPY WITH PROPOFOL N/A 08/05/2015   Procedure: COLONOSCOPY WITH PROPOFOL;  Surgeon: [name], MD;  Location: [facility] ENDOSCOPY;  Service: Endoscopy;  Laterality: N/A;   . DOBUTAMINE STRESS ECHO  07/09/2001   normal  . ESOPHAGOGASTRODUODENOSCOPY  06/11/2006   normal  . ESOPHAGOGASTRODUODENOSCOPY (EGD) WITH PROPOFOL N/A 08/05/2015   Procedure: ESOPHAGOGASTRODUODENOSCOPY (EGD) WITH PROPOFOL;  Surgeon: [name], MD;  Location: [facility] ENDOSCOPY;  Service: Endoscopy;  Laterality: N/A;  . EYE SURGERY     cataracts removed, IOL- both eyes   . JOINT REPLACEMENT     bilateral hips  . LOOP RECORDER IMPLANT N/A 10/14/2014   Procedure: LOOP RECORDER IMPLANT;  Surgeon: Evans Lance, MD;  Location: [facility] CATH LAB;  Service: Cardiovascular;  Laterality: N/A;  . NASAL SINUS SURGERY  1990  . REVERSE SHOULDER ARTHROPLASTY Right 08/13/2014   Procedure: REVERSE SHOULDER ARTHROPLASTY;  Surgeon: Marin Shutter, MD;  Location: [place];  Service: Orthopedics;  Laterality: Right;  interscalene block  . SHOULDER ARTHROSCOPY Left   . TEE WITHOUT CARDIOVERSION N/A 10/14/2014   Procedure: TRANSESOPHAGEAL ECHOCARDIOGRAM (TEE);  Surgeon: Sueanne Margarita, MD;  Location: [employer];  Service: Cardiovascular;  Laterality: N/A;  . TOTAL HIP ARTHROPLASTY     left 1991/right 2008  . ULNAR NERVE TRANSPOSITION Right 08/01/2016   Procedure: RIGHT ULNAR NEUROPLASTY AT THE ELBOW;  Surgeon: Milly Jakob, MD;  Location: [place];  Service: Orthopedics;  Laterality: Right;    Prior to Admission medications   Medication Sig Start Date End Date Taking? Authorizing Provider  acetaminophen (TYLENOL) 325 MG tablet Take 2 tablets (650 mg total) by mouth 3 (three) times daily as needed. 11/13/16   Tonia Ghent, MD  amLODipine (NORVASC) 2.5 MG tablet TAKE 1 TABLET BY MOUTH EVERY DAY 02/21/17   Tonia Ghent, MD  atorvastatin (LIPITOR) 10 MG tablet TAKE 1 TABLET BY MOUTH EVERY DAY 01/30/17   Tonia Ghent, MD  cephALEXin (KEFLEX) 500 MG capsule Take 1 capsule (500 mg total) by mouth 2 (two) times daily. 04/12/17 04/19/17  Gregor Hams, MD  citalopram (CELEXA) 20 MG tablet TAKE 1  TABLET BY MOUTH EVERY DAY 01/30/17   Tonia Ghent, MD  clopidogrel (PLAVIX) 75 MG tablet TAKE 1 TABLET (75 MG TOTAL) BY MOUTH DAILY. 12/16/15   [provider]  donepezil (ARICEPT) 10 MG tablet TAKE 1 TABLET BY MOUTH DAILY AT BEDTIME 01/29/17   Tonia Ghent, MD  doxepin (SINEQUAN) 25 MG capsule TAKE 1 CAPSULE BY MOUTH DAILY AT BEDTIME 04/16/16   [provider]  fentaNYL (DURAGESIC - DOSED MCG/HR) 25 MCG/HR patch Place 25 mcg onto the skin every 3 (three) days.    [provider]  fluticasone (FLONASE) 50 MCG/ACT nasal spray Place into the nose. 03/16/15   [provider]  hydrocortisone 2.5 % cream APPLY RECTALLY 2 TIMES DAILY 12/14/16   Tonia Ghent, MD  ibuprofen (MOTRIN IB) 200 MG tablet Take 2 tablets (400 mg total) by mouth 3 (three) times daily as needed (with food). 11/13/16   Tonia Ghent, MD  montelukast (SINGULAIR) 10 MG tablet Take 10 mg by mouth. 02/21/13   [provider]  Multiple Vitamins-Minerals (EYE VITAMINS) CAPS  Take 1 capsule by mouth daily.    [provider]  Omega-3 Fatty Acids (FISH OIL PO) Take by mouth.    [provider]  ondansetron (ZOFRAN) 8 MG tablet Take 8 mg by mouth every 8 (eight) hours as needed.  11/26/15   [provider]  oxyCODONE-acetaminophen (PERCOCET) 7.5-325 MG tablet Take 1 tablet by mouth 3 (three) times daily.  04/24/16   [provider]  pantoprazole (PROTONIX) 20 MG tablet Take 1 tablet (20 mg total) by mouth daily. 01/11/17   Tonia Ghent, MD  polyvinyl alcohol (LIQUIFILM TEARS) 1.4 % ophthalmic solution Place 1 drop into both eyes as needed.     [provider]  pregabalin (LYRICA) 75 MG capsule Take 75 mg by mouth 2 (two) times daily.    [provider]  SYMBICORT 160-4.5 MCG/ACT inhaler INHALE 2 PUFFS INTO THE LUNGS 2 (TWO) TIMES DAILY AS NEEDED (SHORTNESS OF BREATH). 02/05/17   Tonia Ghent, MD  vitamin E 400 UNIT capsule Take by mouth.     [provider]  Vitamins/Minerals TABS Take by mouth.    [provider]    Allergies Tramadol; Latex; and Penicillins  Family History  Problem Relation Age of Onset  . Heart block Mother   . Stroke Mother   . Heart attack Father   . Sudden death Brother   . Heart attack Brother   . Hypertension Brother   . Esophageal cancer Brother   . Stroke Brother     Social History Social History  Substance Use Topics  . Smoking status: Never Smoker  . Smokeless tobacco: Never Used  . Alcohol use No    Review of Systems Constitutional: No fever/chills Eyes: No visual changes. ENT: No sore throat. Cardiovascular: Denies chest pain. Respiratory: Denies shortness of breath. Gastrointestinal: No abdominal pain.  No nausea, no vomiting.  No diarrhea.  No constipation. Genitourinary: Negative for dysuria. Musculoskeletal: Negative for neck pain.  Negative for back pain. Integumentary: Negative for rash. Neurological: Negative for headaches, focal weakness or numbness. Positive for increasing confusion   ____________________________________________   PHYSICAL EXAM:  VITAL SIGNS: ED Triage Vitals  Enc Vitals Group     BP 04/11/17 2250 (!) 151/88     Pulse Rate 04/11/17 2250 80     Resp 04/11/17 2250 20     Temp 04/11/17 2250 97.3 F (36.3 C)     Temp Source 04/11/17 2250 Oral     SpO2 04/11/17 2250 98 %     Weight 04/11/17 2251 68 kg (150 lb)     Height --      Head Circumference --      Peak Flow --      Pain Score --      Pain Loc --      Pain Edu? --      Excl. in [place]? --     Constitutional: Alert and . Well appearing and in no acute distress. Eyes: Conjunctivae are normal. PERRL. EOMI. Head: Atraumatic. Mouth/Throat: Mucous membranes are moist.  Oropharynx non-erythematous. Neck: No stridor.   Cardiovascular: Normal rate, regular rhythm. Good peripheral circulation. Grossly normal heart sounds. Respiratory: Normal respiratory effort.  No  retractions. Lungs CTAB. Gastrointestinal: Soft and nontender. No distention.  Musculoskeletal: No lower extremity tenderness nor edema. No gross deformities of extremities. Neurologic:  Normal speech and language. No gross focal neurologic deficits are appreciated.  Skin:  Skin is warm, dry and intact. No rash noted. Psychiatric: Mood and  affect are normal. Speech and behavior are normal.  ____________________________________________   LABS (all labs ordered are listed, but only abnormal results are displayed)  Labs Reviewed  COMPREHENSIVE METABOLIC PANEL - Abnormal; Notable for the following:       Result Value   Sodium 129 (*)    Potassium 3.3 (*)    Chloride 100 (*)    Creatinine, Ser 1.03 (*)    Total Protein 6.0 (*)    ALT 13 (*)    GFR calc non Af Amer 53 (*)    All other components within normal limits  CBC - Abnormal; Notable for the following:    Hemoglobin 11.8 (*)    MCV 79.9 (*)    MCH 25.9 (*)    RDW 16.2 (*)    Platelets 543 (*)    All other components within normal limits  URINALYSIS, COMPLETE (UACMP) WITH MICROSCOPIC - Abnormal; Notable for the following:    Color, Urine STRAW (*)    APPearance CLEAR (*)    Specific Gravity, Urine 1.004 (*)    Leukocytes, UA MODERATE (*)    Bacteria, UA FEW (*)    Squamous Epithelial / LPF 0-5 (*)    All other components within normal limits  URINE CULTURE  TROPONIN I   ____________________________________________  EKG  ED ECG REPORT I, [place] N BROWN, the attending physician, personally viewed and interpreted this ECG.   Date: 04/11/2017  EKG Time: 10:59PM  Rate: 77  Rhythm: Normal Sinus rhythm  Axis: Normal  Intervals:Normal  ST&T Change: None  ____________________________________________  RADIOLOGY I, [place] N BROWN, personally viewed and evaluated these images (plain radiographs) as part of my medical decision making, as well as reviewing the written report by the radiologist.  Ct Head Wo  Contrast  Result Date: 04/11/2017 CLINICAL DATA:  Dementia with increased confusion and falls EXAM: CT HEAD WITHOUT CONTRAST TECHNIQUE: Contiguous axial images were obtained from the base of the skull through the vertex without intravenous contrast. COMPARISON:  MRI,  CT brain 07/04/2015 FINDINGS: Brain: No acute territorial infarction, hemorrhage or intracranial mass is seen. Moderate subcortical and periventricular small vessel ischemic changes. Moderate atrophy. Stable ventricle size. Vascular: No hyperdense vessels. Vertebral artery calcifications. Carotid artery calcifications. Skull: No fracture or suspicious bone lesion. Sinuses/Orbits: Small fluid in the sphenoid sinus. No acute orbital abnormality. Other: None IMPRESSION: No CT evidence for acute intracranial abnormality. Atrophy and small vessel ischemic changes of the white matter. Right sphenoid sinusitis. Electronically Signed   By: Donavan Foil M.D.   On: 04/11/2017 23:19     Procedures   ____________________________________________   INITIAL IMPRESSION / ASSESSMENT AND PLAN / ED COURSE  Pertinent labs & imaging results that were available during my care of the patient were reviewed by me and considered in my medical decision making (see chart for details).  71 year old female with history of dementia presenting with increasing levels of confusion. Per the patient's son he is unsure if this is his mother's baseline as he does not live with his parents and states that the father is usually the caregiver of their mother.  Spoke with the patient's son and daughter at length following all clinical testing including negative CT scan of the head urinalysis revealed possible UTI and a such patient given Keflex in the emergency department I informed him that the UA was not conclusive as such urine culture will be obtained. Both patient's son and daughter agreed to not admit their mother to the hospital.  ____________________________________________  FINAL CLINICAL IMPRESSION(S) / ED DIAGNOSES  Final diagnoses:  Hyponatremia  Acute cystitis without hematuria     MEDICATIONS GIVEN DURING THIS VISIT:  Medications  sodium chloride 0.9 % bolus 1,000 mL (0 mLs Intravenous Stopped 04/12/17 0037)  potassium chloride (KLOR-CON) packet 40 mEq (40 mEq Oral Given 04/12/17 0304)  cephALEXin (KEFLEX) capsule 500 mg (500 mg Oral Given 04/12/17 0304)     NEW OUTPATIENT MEDICATIONS STARTED DURING THIS VISIT:  Discharge Medication List as of 04/12/2017  2:59 AM    START taking these medications   Details  cephALEXin (KEFLEX) 500 MG capsule Take 1 capsule (500 mg total) by mouth 2 (two) times daily., Starting Thu 04/12/2017, Until Thu 04/19/2017, Print        Discharge Medication List as of 04/12/2017  2:59 AM      Discharge Medication List as of 04/12/2017  2:59 AM       Note:  This document was prepared using Dragon voice recognition software and may include unintentional dictation errors.    Gregor Hams, MD 04/12/17 [phone]

## 2017-04-11 NOTE — ED Triage Notes (Signed)
Pt brought in by son states pt has dementia but has had increased confusion and falls since today. Pt co bilat shoulder pain, has hx of strokes and son is worried about new onset.

## 2017-04-11 NOTE — ED Notes (Addendum)
Pt family states that this morning when she woke up she was trembling and unable to follow directions - symptoms increased at 1pm and pt fell at 8pm - denies hitting head or loss of consciousness - tonight the son states she was not able to follow directions to get her gown on and was unable to walk/weak - charge nurse went to notify Dr Karma Greaser - son states pt had a stroke in 2015 - report given to [facility] RN

## 2017-04-12 DIAGNOSIS — E871 Hypo-osmolality and hyponatremia: Secondary | ICD-10-CM | POA: Diagnosis not present

## 2017-04-12 LAB — COMPREHENSIVE METABOLIC PANEL
ALK PHOS: 49 U/L (ref 38–126)
ALT: 13 U/L — ABNORMAL LOW (ref 14–54)
ANION GAP: 6 (ref 5–15)
AST: 25 U/L (ref 15–41)
Albumin: 3.6 g/dL (ref 3.5–5.0)
BILIRUBIN TOTAL: 0.4 mg/dL (ref 0.3–1.2)
BUN: 14 mg/dL (ref 6–20)
CALCIUM: 9.1 mg/dL (ref 8.9–10.3)
CO2: 23 mmol/L (ref 22–32)
Chloride: 100 mmol/L — ABNORMAL LOW (ref 101–111)
Creatinine, Ser: 1.03 mg/dL — ABNORMAL HIGH (ref 0.44–1.00)
GFR, EST NON AFRICAN AMERICAN: 53 mL/min — AB (ref >60)
Glucose, Bld: 85 mg/dL (ref 65–99)
Potassium: 3.3 mmol/L — ABNORMAL LOW (ref 3.5–5.1)
SODIUM: 129 mmol/L — AB (ref 135–145)
TOTAL PROTEIN: 6 g/dL — AB (ref 6.5–8.1)

## 2017-04-12 LAB — URINALYSIS, COMPLETE (UACMP) WITH MICROSCOPIC
BILIRUBIN URINE: NEGATIVE
Glucose, UA: NEGATIVE mg/dL
HGB URINE DIPSTICK: NEGATIVE
Ketones, ur: NEGATIVE mg/dL
NITRITE: NEGATIVE
PH: 6 (ref 5.0–8.0)
Protein, ur: NEGATIVE mg/dL
SPECIFIC GRAVITY, URINE: 1.004 — AB (ref 1.005–1.030)

## 2017-04-12 LAB — TROPONIN I: Troponin I: <0.03 ng/mL (ref <0.03)

## 2017-04-12 MED ORDER — POTASSIUM CHLORIDE 20 MEQ PO PACK
40.0000 meq | PACK | Freq: Once | ORAL | Status: AC
  Administered 2017-04-12: 40 meq via ORAL
  Filled 2017-04-12: qty 2

## 2017-04-12 MED ORDER — CEPHALEXIN 500 MG PO CAPS
500.0000 mg | ORAL_CAPSULE | Freq: Once | ORAL | Status: AC
  Administered 2017-04-12: 500 mg via ORAL
  Filled 2017-04-12: qty 1

## 2017-04-12 MED ORDER — CEPHALEXIN 500 MG PO CAPS: 500.0000 mg | ORAL_CAPSULE | Freq: Two times a day (BID) | ORAL | 0 refills | Status: AC

## 2017-04-14 LAB — URINE CULTURE

## 2017-04-24 DIAGNOSIS — M792 Neuralgia and neuritis, unspecified: Secondary | ICD-10-CM | POA: Diagnosis not present

## 2017-04-24 DIAGNOSIS — Z79899 Other long term (current) drug therapy: Secondary | ICD-10-CM | POA: Diagnosis not present

## 2017-04-24 DIAGNOSIS — M19019 Primary osteoarthritis, unspecified shoulder: Secondary | ICD-10-CM | POA: Diagnosis not present

## 2017-04-24 DIAGNOSIS — G894 Chronic pain syndrome: Secondary | ICD-10-CM | POA: Diagnosis not present

## 2017-04-24 DIAGNOSIS — M47812 Spondylosis without myelopathy or radiculopathy, cervical region: Secondary | ICD-10-CM | POA: Diagnosis not present

## 2017-04-24 DIAGNOSIS — Z79891 Long term (current) use of opiate analgesic: Secondary | ICD-10-CM | POA: Diagnosis not present

## 2017-05-01 DIAGNOSIS — Z79899 Other long term (current) drug therapy: Secondary | ICD-10-CM | POA: Diagnosis not present

## 2017-05-01 DIAGNOSIS — G609 Hereditary and idiopathic neuropathy, unspecified: Secondary | ICD-10-CM | POA: Diagnosis not present

## 2017-05-01 DIAGNOSIS — G894 Chronic pain syndrome: Secondary | ICD-10-CM | POA: Diagnosis not present

## 2017-05-01 DIAGNOSIS — M792 Neuralgia and neuritis, unspecified: Secondary | ICD-10-CM | POA: Diagnosis not present

## 2017-05-01 DIAGNOSIS — Z79891 Long term (current) use of opiate analgesic: Secondary | ICD-10-CM | POA: Diagnosis not present

## 2017-05-01 DIAGNOSIS — F4542 Pain disorder with related psychological factors: Secondary | ICD-10-CM | POA: Diagnosis not present

## 2017-05-03 ENCOUNTER — Ambulatory Visit (INDEPENDENT_AMBULATORY_CARE_PROVIDER_SITE_OTHER): Payer: Medicare Other | Admitting: *Deleted

## 2017-05-03 DIAGNOSIS — I639 Cerebral infarction, unspecified: Secondary | ICD-10-CM | POA: Diagnosis not present

## 2017-05-08 NOTE — Progress Notes (Signed)
[facility] Summary Report / Loop Recorder 

## 2017-05-14 ENCOUNTER — Other Ambulatory Visit: Payer: Self-pay | Admitting: Family Medicine

## 2017-05-14 NOTE — Telephone Encounter (Signed)
Electronic refill request. Doxepin Last office visit:   03/02/17 Last Filled:   ? quantity 5 04/16/2016  Please advise.

## 2017-05-15 NOTE — Telephone Encounter (Signed)
Sent. Thanks.   

## 2017-05-16 ENCOUNTER — Other Ambulatory Visit: Payer: Medicare Other

## 2017-05-16 ENCOUNTER — Other Ambulatory Visit: Payer: Self-pay

## 2017-05-16 ENCOUNTER — Telehealth: Payer: Self-pay | Admitting: Gastroenterology

## 2017-05-16 DIAGNOSIS — R197 Diarrhea, unspecified: Secondary | ICD-10-CM | POA: Diagnosis not present

## 2017-05-16 NOTE — Telephone Encounter (Signed)
Pt started having diarrhea yesterday evening and had another diarrhea stool this morning. Pts husband is afraid she may have c diff again. Husband is wanting to know if they should test her stool again. Please advise.

## 2017-05-16 NOTE — Telephone Encounter (Signed)
Pts husband aware, order in epic.

## 2017-05-16 NOTE — Telephone Encounter (Signed)
Yes, GI pathogen panel.  Thanks

## 2017-05-17 LAB — GASTROINTESTINAL PATHOGEN PANEL PCR
C. difficile Tox A/B, PCR: DETECTED — CR
CAMPYLOBACTER, PCR: NOT DETECTED
Cryptosporidium, PCR: NOT DETECTED
E coli (ETEC) LT/ST PCR: NOT DETECTED
E coli (STEC) stx1/stx2, PCR: NOT DETECTED
E coli 0157, PCR: NOT DETECTED
GIARDIA LAMBLIA, PCR: NOT DETECTED
NOROVIRUS, PCR: NOT DETECTED
Rotavirus A, PCR: NOT DETECTED
SHIGELLA, PCR: NOT DETECTED
Salmonella, PCR: DETECTED — CR

## 2017-05-18 ENCOUNTER — Other Ambulatory Visit: Payer: Self-pay

## 2017-05-18 LAB — CUP PACEART REMOTE DEVICE CHECK
Implantable Pulse Generator Implant Date: 20151230
MDC IDC SESS DTM: 20180720004232

## 2017-05-18 MED ORDER — CIPROFLOXACIN HCL 500 MG PO TABS: 500.0000 mg | ORAL_TABLET | Freq: Two times a day (BID) | ORAL | 0 refills | Status: AC

## 2017-05-18 NOTE — Telephone Encounter (Signed)
Pt has been notified of the results and labs sent to the pharmacy

## 2017-05-22 ENCOUNTER — Telehealth: Payer: Self-pay | Admitting: Gastroenterology

## 2017-05-23 MED ORDER — VANCOMYCIN 50 MG/ML ORAL SOLUTION: 125.0000 mg | Freq: Four times a day (QID) | ORAL | 0 refills | Status: DC

## 2017-05-23 NOTE — Telephone Encounter (Signed)
Ok, lets try vancomycin 125mg  QID for two weeks, no refills. Ask that her husband call to report on her response in 2-3 weeks. Thanks

## 2017-05-23 NOTE — Telephone Encounter (Signed)
Patient husband states that pharmacy is telling him that they have not received rx. Please resent.

## 2017-05-23 NOTE — Telephone Encounter (Signed)
Pts husband states she was placed on cipro for salmonella last week and she is not any better, still having diarrhea and loose stools. Pt has a few more days of cipro left. Please advise.

## 2017-05-23 NOTE — Telephone Encounter (Signed)
The pt husband has been notified of the vanc sent to [facility] and he will call in 2-3 weeks to update

## 2017-05-24 ENCOUNTER — Other Ambulatory Visit: Payer: Self-pay

## 2017-05-24 MED ORDER — VANCOMYCIN 50 MG/ML ORAL SOLUTION: 125.0000 mg | Freq: Four times a day (QID) | ORAL | 0 refills | Status: AC

## 2017-05-24 MED FILL — VANCOMYCIN 50MG/ML ORAL SOL: 50MG/1ML | 14 days supply | Qty: 140 | Fill #0 | Status: TO

## 2017-05-30 DIAGNOSIS — M47812 Spondylosis without myelopathy or radiculopathy, cervical region: Secondary | ICD-10-CM | POA: Diagnosis not present

## 2017-05-30 DIAGNOSIS — M792 Neuralgia and neuritis, unspecified: Secondary | ICD-10-CM | POA: Diagnosis not present

## 2017-05-30 DIAGNOSIS — M19019 Primary osteoarthritis, unspecified shoulder: Secondary | ICD-10-CM | POA: Diagnosis not present

## 2017-05-30 DIAGNOSIS — Z79891 Long term (current) use of opiate analgesic: Secondary | ICD-10-CM | POA: Diagnosis not present

## 2017-05-30 DIAGNOSIS — Z79899 Other long term (current) drug therapy: Secondary | ICD-10-CM | POA: Diagnosis not present

## 2017-05-30 DIAGNOSIS — G894 Chronic pain syndrome: Secondary | ICD-10-CM | POA: Diagnosis not present

## 2017-06-04 ENCOUNTER — Ambulatory Visit (INDEPENDENT_AMBULATORY_CARE_PROVIDER_SITE_OTHER): Payer: Medicare Other | Admitting: *Deleted

## 2017-06-04 DIAGNOSIS — I639 Cerebral infarction, unspecified: Secondary | ICD-10-CM

## 2017-06-05 NOTE — Progress Notes (Signed)
[facility] Summary Report / Loop Recorder 

## 2017-06-07 ENCOUNTER — Telehealth: Payer: Self-pay | Admitting: Gastroenterology

## 2017-06-07 NOTE — Telephone Encounter (Signed)
Left message for pts husband that the vanco script was for 14 days worth of medication with no refills. Pt was to call with an update of her symptoms once medication complete. Waiting for pt to call back.

## 2017-06-08 LAB — CUP PACEART REMOTE DEVICE CHECK
Date Time Interrogation Session: 20180819014139
MDC IDC PG IMPLANT DT: 20151230

## 2017-06-08 NOTE — Telephone Encounter (Signed)
The pt has been notified that the vanc does not need to be refilled.   The pt's husband will call with any concerns or recurrence of C Diff (diarrhea)

## 2017-06-12 DIAGNOSIS — M47812 Spondylosis without myelopathy or radiculopathy, cervical region: Secondary | ICD-10-CM | POA: Diagnosis not present

## 2017-06-12 DIAGNOSIS — Z79899 Other long term (current) drug therapy: Secondary | ICD-10-CM | POA: Diagnosis not present

## 2017-06-12 DIAGNOSIS — M792 Neuralgia and neuritis, unspecified: Secondary | ICD-10-CM | POA: Diagnosis not present

## 2017-06-12 DIAGNOSIS — Z79891 Long term (current) use of opiate analgesic: Secondary | ICD-10-CM | POA: Diagnosis not present

## 2017-06-12 DIAGNOSIS — M25519 Pain in unspecified shoulder: Secondary | ICD-10-CM | POA: Diagnosis not present

## 2017-06-12 DIAGNOSIS — G894 Chronic pain syndrome: Secondary | ICD-10-CM | POA: Diagnosis not present

## 2017-06-22 IMAGING — CR DG CHEST 2V
2 series · 2 of 2 positions shown · non-contrast
Comparison: Chest x-ray dated 06/25/2015.

CLINICAL DATA: Aspiration pneumonia, weakness, history of asthma.

EXAM:
CHEST  2 VIEW

[chest lat]
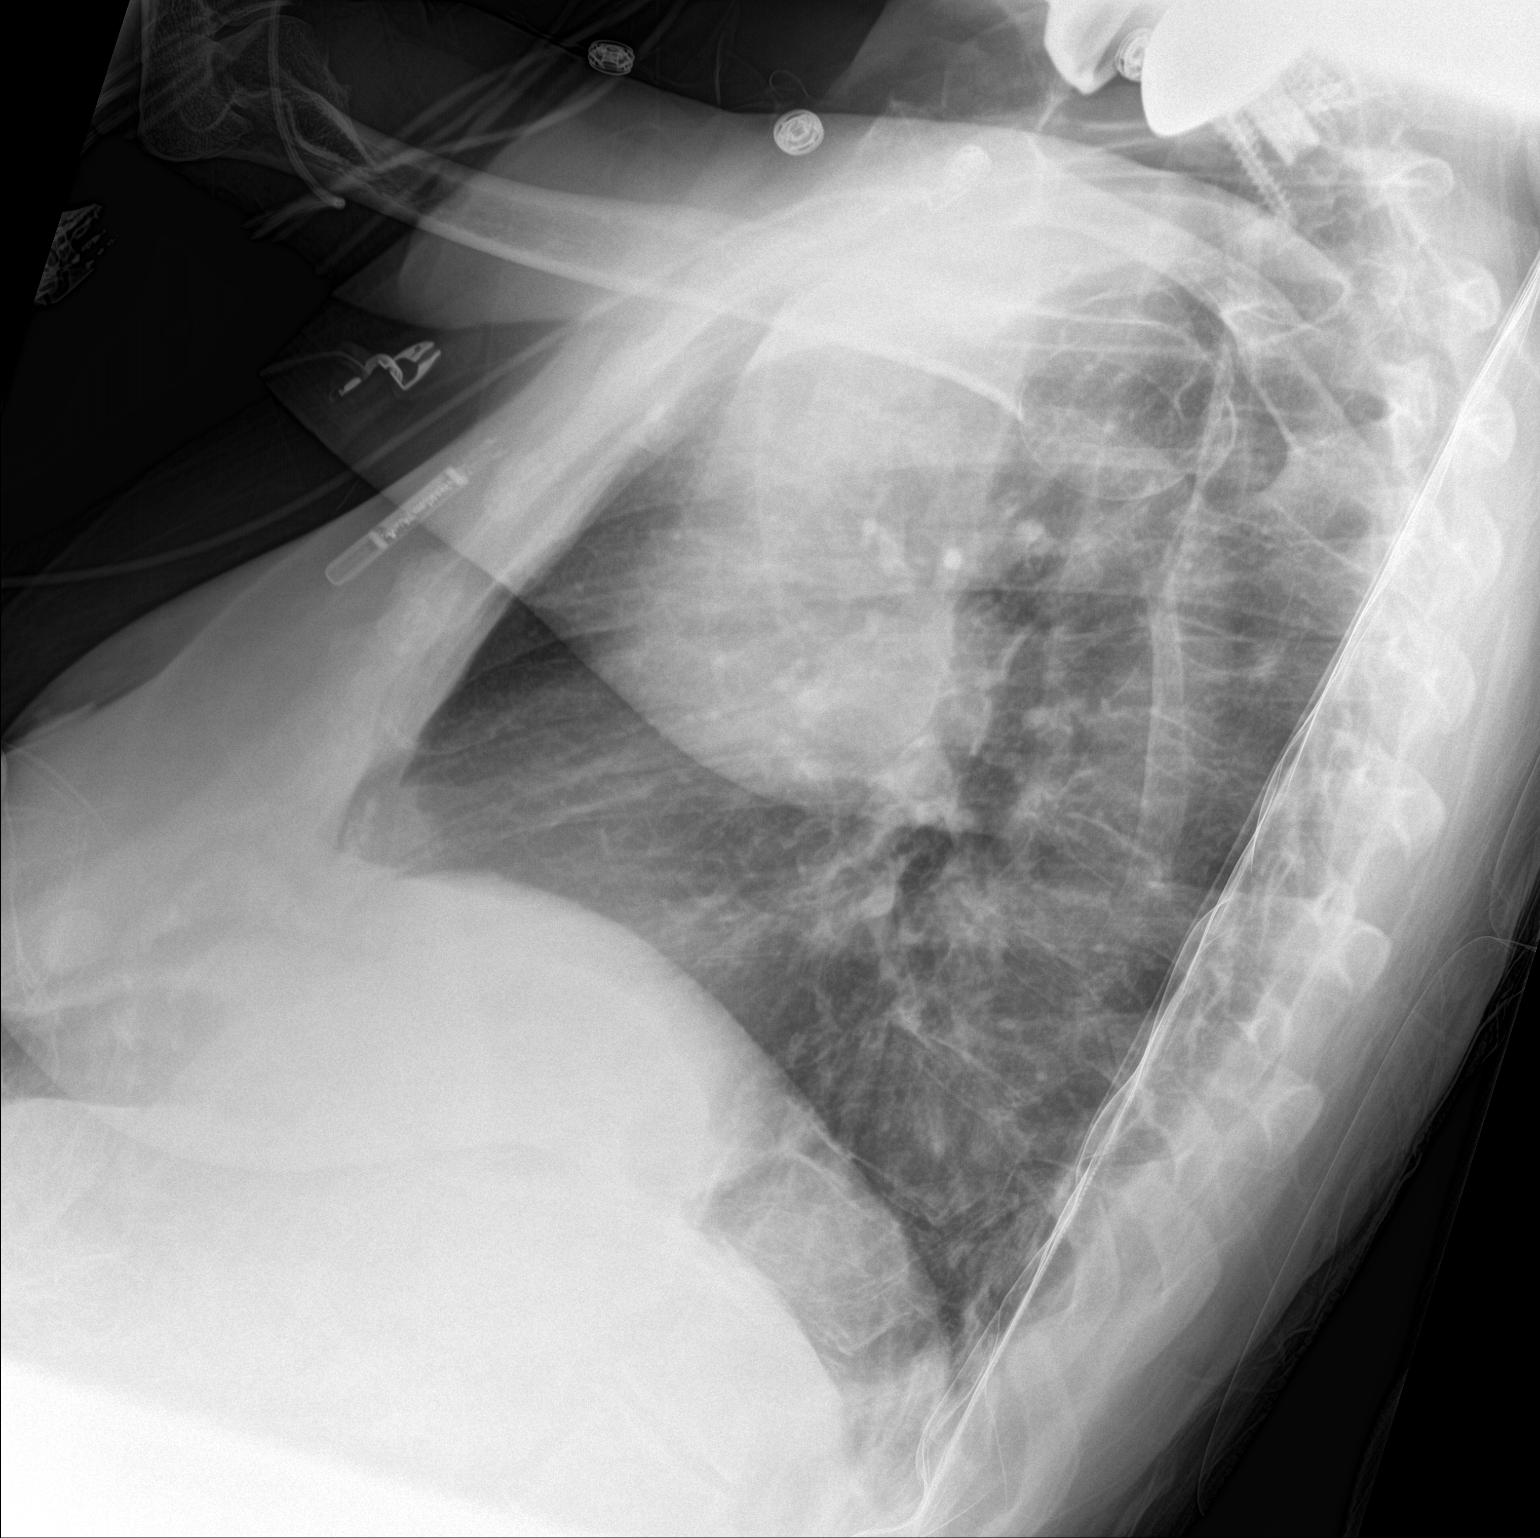

[chest ap]
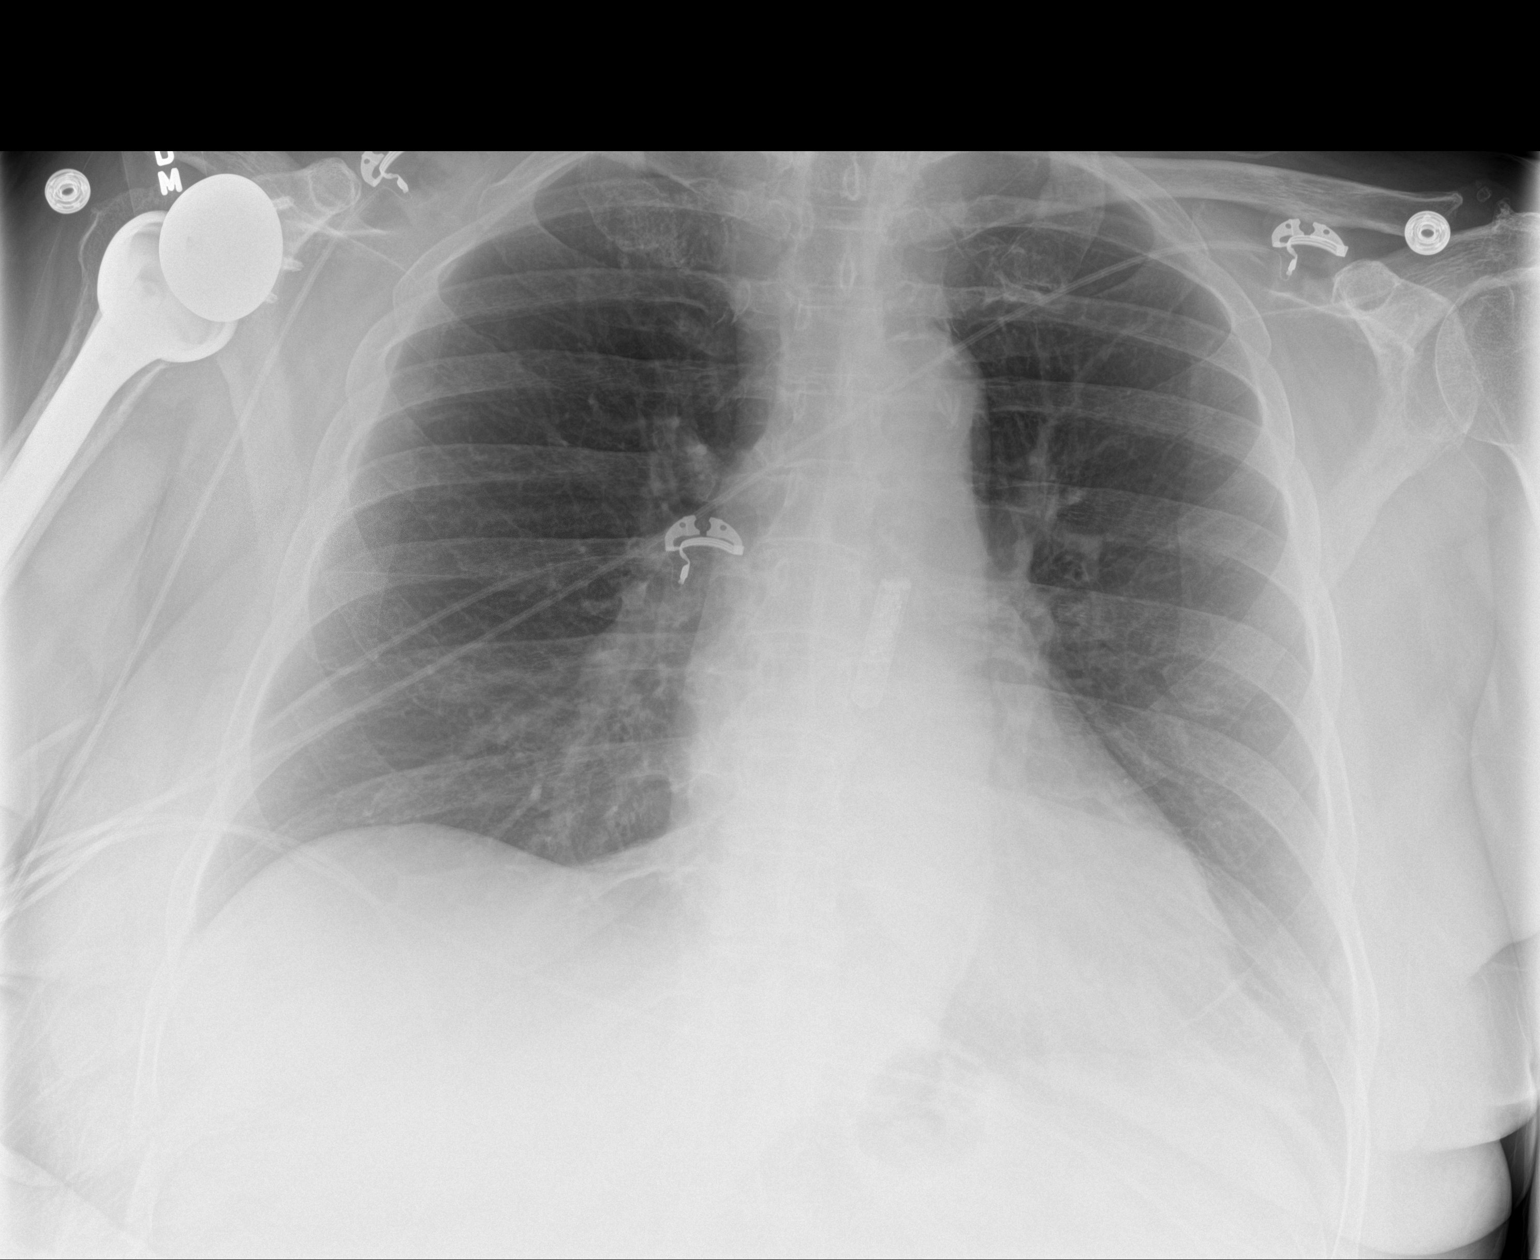

[2 of 2 positions shown; findings below may reference images not displayed]

FINDINGS: Right-sided internal jugular central line has been removed in the
interval. Heart size is upper normal, unchanged. Overall
cardiomediastinal silhouette is stable in size and configuration.
Lungs are clear. No pleural effusions seen. No pneumothorax. No
acute osseous abnormality.
IMPRESSION: No evidence of acute cardiopulmonary abnormality.  Lungs are clear.

## 2017-06-27 DIAGNOSIS — G894 Chronic pain syndrome: Secondary | ICD-10-CM | POA: Diagnosis not present

## 2017-06-27 DIAGNOSIS — M47812 Spondylosis without myelopathy or radiculopathy, cervical region: Secondary | ICD-10-CM | POA: Diagnosis not present

## 2017-06-27 DIAGNOSIS — M792 Neuralgia and neuritis, unspecified: Secondary | ICD-10-CM | POA: Diagnosis not present

## 2017-06-27 DIAGNOSIS — M19019 Primary osteoarthritis, unspecified shoulder: Secondary | ICD-10-CM | POA: Diagnosis not present

## 2017-07-02 ENCOUNTER — Ambulatory Visit (INDEPENDENT_AMBULATORY_CARE_PROVIDER_SITE_OTHER): Payer: Medicare Other | Admitting: *Deleted

## 2017-07-02 DIAGNOSIS — I639 Cerebral infarction, unspecified: Secondary | ICD-10-CM | POA: Diagnosis not present

## 2017-07-03 NOTE — Progress Notes (Signed)
[facility] Summary Report / Loop Recorder 

## 2017-07-05 LAB — CUP PACEART REMOTE DEVICE CHECK
Date Time Interrogation Session: 20180918021122
MDC IDC PG IMPLANT DT: 20151230

## 2017-07-06 DIAGNOSIS — Z23 Encounter for immunization: Secondary | ICD-10-CM | POA: Diagnosis not present

## 2017-07-07 ENCOUNTER — Other Ambulatory Visit: Payer: Self-pay | Admitting: Family Medicine

## 2017-07-09 NOTE — Telephone Encounter (Signed)
Electronic refill request. Clopidogrel Last office visit:   03/02/17 Last Filled:   12/16/15 Dr. Grayland Ormond Please advise.

## 2017-07-10 NOTE — Telephone Encounter (Signed)
Message sent to [facility] to schedule AMW

## 2017-07-10 NOTE — Telephone Encounter (Signed)
Sent.  Thanks.  Due for AMW when possible.  Thanks.

## 2017-07-12 ENCOUNTER — Other Ambulatory Visit: Payer: Self-pay | Admitting: Family Medicine

## 2017-07-19 ENCOUNTER — Encounter: Payer: Self-pay | Admitting: Family Medicine

## 2017-07-25 ENCOUNTER — Other Ambulatory Visit: Payer: Self-pay | Admitting: Family Medicine

## 2017-07-25 DIAGNOSIS — M47812 Spondylosis without myelopathy or radiculopathy, cervical region: Secondary | ICD-10-CM | POA: Diagnosis not present

## 2017-07-25 DIAGNOSIS — M792 Neuralgia and neuritis, unspecified: Secondary | ICD-10-CM | POA: Diagnosis not present

## 2017-07-25 DIAGNOSIS — G894 Chronic pain syndrome: Secondary | ICD-10-CM | POA: Diagnosis not present

## 2017-07-25 DIAGNOSIS — Z79891 Long term (current) use of opiate analgesic: Secondary | ICD-10-CM | POA: Diagnosis not present

## 2017-07-25 DIAGNOSIS — M19019 Primary osteoarthritis, unspecified shoulder: Secondary | ICD-10-CM | POA: Diagnosis not present

## 2017-07-25 DIAGNOSIS — Z79899 Other long term (current) drug therapy: Secondary | ICD-10-CM | POA: Diagnosis not present

## 2017-07-25 NOTE — Telephone Encounter (Signed)
See drug interaction between Citalopram and Doxepin Upcoming appointment scheduled 08/06/17

## 2017-07-26 ENCOUNTER — Encounter: Payer: Self-pay | Admitting: Family Medicine

## 2017-07-26 NOTE — Telephone Encounter (Signed)
Okay to continue.  Has tolerated.  Sent.  Thanks.

## 2017-07-29 ENCOUNTER — Other Ambulatory Visit: Payer: Self-pay | Admitting: Family Medicine

## 2017-07-29 DIAGNOSIS — I10 Essential (primary) hypertension: Secondary | ICD-10-CM

## 2017-07-30 ENCOUNTER — Ambulatory Visit (INDEPENDENT_AMBULATORY_CARE_PROVIDER_SITE_OTHER): Payer: Medicare Other

## 2017-07-30 VITALS — BP 102/64 | HR 86 | Temp 97.9°F | Ht 64.25 in | Wt 138.0 lb

## 2017-07-30 DIAGNOSIS — I10 Essential (primary) hypertension: Secondary | ICD-10-CM | POA: Diagnosis not present

## 2017-07-30 DIAGNOSIS — Z0000 Encounter for general adult medical examination without abnormal findings: Secondary | ICD-10-CM | POA: Diagnosis not present

## 2017-07-30 LAB — LIPID PANEL
CHOL/HDL RATIO: 3
Cholesterol: 148 mg/dL (ref 0–200)
HDL: 44.4 mg/dL (ref >39.00)
LDL CALC: 81 mg/dL (ref 0–99)
NonHDL: 103.51
Triglycerides: 115 mg/dL (ref 0.0–149.0)
VLDL: 23 mg/dL (ref 0.0–40.0)

## 2017-07-30 LAB — COMPREHENSIVE METABOLIC PANEL
ALBUMIN: 3.9 g/dL (ref 3.5–5.2)
ALT: 9 U/L (ref 0–35)
AST: 17 U/L (ref 0–37)
Alkaline Phosphatase: 47 U/L (ref 39–117)
BUN: 10 mg/dL (ref 6–23)
CHLORIDE: 104 meq/L (ref 96–112)
CO2: 29 meq/L (ref 19–32)
CREATININE: 1 mg/dL (ref 0.40–1.20)
Calcium: 9.3 mg/dL (ref 8.4–10.5)
GFR: 57.88 mL/min — ABNORMAL LOW (ref >60.00)
GLUCOSE: 102 mg/dL — AB (ref 70–99)
Potassium: 4 mEq/L (ref 3.5–5.1)
SODIUM: 140 meq/L (ref 135–145)
Total Bilirubin: 0.2 mg/dL (ref 0.2–1.2)
Total Protein: 6.3 g/dL (ref 6.0–8.3)

## 2017-07-30 NOTE — Progress Notes (Signed)
Subjective:   Aimee Jones is a 71 y.o. female who presents for Medicare Annual (Subsequent) preventive examination.  Review of Systems:  N/A Cardiac Risk Factors include: advanced age (>80men, >86 women);hypertension     Objective:     Vitals: BP 102/64 (BP Location: Right Arm, Patient Position: Sitting, Cuff Size: Normal)   Pulse 86   Temp 97.9 F (36.6 C) (Oral)   Ht 5' 4.25" (1.632 m) Comment: no shoes  Wt 138 lb (62.6 kg)   SpO2 95%   BMI 23.50 kg/m   Body mass index is 23.5 kg/m.   Tobacco History  Smoking Status  . Never Smoker  Smokeless Tobacco  . Never Used     Counseling given: No   Past Medical History:  Diagnosis Date  . Allergy   . Anxiety    panic attacks- on occas.   . Arthritis    osteoporosis - especially hips.   . Asthma   . Confusion   . Dementia   . Depression   . Diverticulosis   . ETOH abuse   . Falls   . GERD (gastroesophageal reflux disease)   . Hyperlipidemia   . Hypertension   . Hyponatremia 07/05/2015  . Memory loss   . Normal 24 hour ambulatory pH monitoring study    good acid suppression  . Observed seizure-like activity ([place]) 07/05/2015   observed by husband , Dr Doy Mince  . Shortness of breath   . Stroke [facility])    Past Surgical History:  Procedure Laterality Date  . ABDOMINAL HYSTERECTOMY    . BREAST SURGERY     3x- biposies   . COLONOSCOPY WITH PROPOFOL N/A 08/05/2015   Procedure: COLONOSCOPY WITH PROPOFOL;  Surgeon: [name], MD;  Location: [facility];  Service: Endoscopy;  Laterality: N/A;  . DOBUTAMINE STRESS ECHO  07/09/2001   normal  . ESOPHAGOGASTRODUODENOSCOPY  06/11/2006   normal  . ESOPHAGOGASTRODUODENOSCOPY (EGD) WITH PROPOFOL N/A 08/05/2015   Procedure: ESOPHAGOGASTRODUODENOSCOPY (EGD) WITH PROPOFOL;  Surgeon: [name], MD;  Location: [facility];  Service: Endoscopy;  Laterality: N/A;  . EYE SURGERY     cataracts removed, IOL- both eyes   . JOINT REPLACEMENT     bilateral  hips  . LOOP RECORDER IMPLANT N/A 10/14/2014   Procedure: LOOP RECORDER IMPLANT;  Surgeon: Evans Lance, MD;  Location: [facility] CATH LAB;  Service: Cardiovascular;  Laterality: N/A;  . NASAL SINUS SURGERY  1990  . REVERSE SHOULDER ARTHROPLASTY Right 08/13/2014   Procedure: REVERSE SHOULDER ARTHROPLASTY;  Surgeon: Marin Shutter, MD;  Location: [place];  Service: Orthopedics;  Laterality: Right;  interscalene block  . SHOULDER ARTHROSCOPY Left   . TEE WITHOUT CARDIOVERSION N/A 10/14/2014   Procedure: TRANSESOPHAGEAL ECHOCARDIOGRAM (TEE);  Surgeon: Sueanne Margarita, MD;  Location: [place];  Service: Cardiovascular;  Laterality: N/A;  . TOTAL HIP ARTHROPLASTY     left 1991/right 2008  . ULNAR NERVE TRANSPOSITION Right 08/01/2016   Procedure: RIGHT ULNAR NEUROPLASTY AT THE ELBOW;  Surgeon: Milly Jakob, MD;  Location: [place];  Service: Orthopedics;  Laterality: Right;   Family History  Problem Relation Age of Onset  . Heart block Mother   . Stroke Mother   . Heart attack Father   . Sudden death Brother   . Heart attack Brother   . Hypertension Brother   . Esophageal cancer Brother   . Stroke Brother    History  Sexual Activity  . Sexual activity: Yes  Outpatient Encounter Prescriptions as of 07/30/2017  Medication Sig  . acetaminophen (TYLENOL) 325 MG tablet Take 2 tablets (650 mg total) by mouth 3 (three) times daily as needed.  Marland Kitchen amLODipine (NORVASC) 2.5 MG tablet TAKE 1 TABLET BY MOUTH EVERY DAY  . atorvastatin (LIPITOR) 10 MG tablet TAKE 1 TABLET BY MOUTH EVERY DAY  . citalopram (CELEXA) 20 MG tablet TAKE 1 TABLET BY MOUTH EVERY DAY  . clopidogrel (PLAVIX) 75 MG tablet TAKE 1 TABLET (75 MG TOTAL) BY MOUTH DAILY.  Marland Kitchen donepezil (ARICEPT) 10 MG tablet TAKE 1 TABLET BY MOUTH DAILY AT BEDTIME  . doxepin (SINEQUAN) 25 MG capsule TAKE 1 CAPSULE BY MOUTH DAILY AT BEDTIME  . fentaNYL (DURAGESIC - DOSED MCG/HR) 25 MCG/HR patch Place 25 mcg onto the skin every 3  (three) days.  . fluticasone (FLONASE) 50 MCG/ACT nasal spray Place into the nose.  . hydrocortisone 2.5 % cream APPLY RECTALLY 2 TIMES DAILY  . ibuprofen (MOTRIN IB) 200 MG tablet Take 2 tablets (400 mg total) by mouth 3 (three) times daily as needed (with food).  . montelukast (SINGULAIR) 10 MG tablet Take 10 mg by mouth.  . Multiple Vitamins-Minerals (EYE VITAMINS) CAPS Take 1 capsule by mouth daily.  . Omega-3 Fatty Acids (FISH OIL PO) Take by mouth.  . ondansetron (ZOFRAN) 8 MG tablet Take 8 mg by mouth every 8 (eight) hours as needed.   Marland Kitchen oxyCODONE-acetaminophen (PERCOCET) 7.5-325 MG tablet Take 1 tablet by mouth 3 (three) times daily.   . pantoprazole (PROTONIX) 20 MG tablet TAKE 1 TABLET BY MOUTH EVERY DAY  . polyvinyl alcohol (LIQUIFILM TEARS) 1.4 % ophthalmic solution Place 1 drop into both eyes as needed.   . pregabalin (LYRICA) 75 MG capsule Take 75 mg by mouth 2 (two) times daily.  . SYMBICORT 160-4.5 MCG/ACT inhaler INHALE 2 PUFFS INTO THE LUNGS 2 (TWO) TIMES DAILY AS NEEDED (SHORTNESS OF BREATH).  Marland Kitchen vitamin E 400 UNIT capsule Take by mouth.  . Vitamins/Minerals TABS Take by mouth.   No facility-administered encounter medications on file as of 07/30/2017.     Activities of Daily Living In your present state of health, do you have any difficulty performing the following activities: 07/30/2017 08/01/2016  Hearing? N N  Vision? N N  Difficulty concentrating or making decisions? [name]  Walking or climbing stairs? N N  Dressing or bathing? Y N  Doing errands, shopping? Y -  Conservation officer, nature and eating ? Y -  Using the Toilet? N -  In the past six months, have you accidently leaked urine? N -  Do you have problems with loss of bowel control? Y -  Managing your Medications? Y -  Managing your Finances? Y -  Housekeeping or managing your Housekeeping? Y -  Some recent data might be hidden    Patient Care Team: Tonia Ghent, MD as PCP - General (Family Medicine) Justice Britain, MD as Consulting Physician (Orthopedic Surgery)    Assessment:     Hearing Screening   125Hz  250Hz  500Hz  1000Hz  2000Hz  3000Hz  4000Hz  6000Hz  8000Hz   Right ear:   0 0 0  0    Left ear:   0 0 0  0    Vision Screening Comments: Last vision exam in June 2018 @ [facility]   Exercise Activities and Dietary recommendations Current Exercise Habits: Home exercise routine, Type of exercise: walking, Time (Minutes): 30, Frequency (Times/Week): 7, Weekly Exercise (Minutes/Week): 210, Intensity: Mild, Exercise limited by: None identified  Goals    . Increase physical activity          Starting 07/30/2017, I will continue to walk at least 30 min daily as weather permits.       Fall Risk Fall Risk  07/30/2017 05/19/2016 09/27/2015 12/29/2013 07/26/2012  Falls in the past year? No No Yes Yes -  Number falls in past yr: - - 1 2 or more -  Injury with Fall? - - Yes - -  Comment - - hematoma to left eye - -  Risk for fall due to : - - - Impaired balance/gait Impaired balance/gait  Follow up - - Falls evaluation completed - -   Depression Screen PHQ 2/9 Scores 07/30/2017 05/19/2016 04/22/2015 12/29/2013  PHQ - 2 Score 4 0 2 2  PHQ- 9 Score 13 - - -     Cognitive Function MMSE - Mini Mental State Exam 07/30/2017 05/19/2016  Not completed: (No Data) Unable to complete   PLEASE NOTE: A Mini-Cog screen was not completed. Active diagnoses: dementia associated with alcoholism, memory loss, and altered mental status.   Montreal Cognitive Assessment  08/24/2015  Visuospatial/ Executive (0/5) 0  Naming (0/3) 1  Attention: Read list of digits (0/2) 2  Attention: Read list of letters (0/1) 1  Attention: Serial 7 subtraction starting at 100 (0/3) 0  Language: Repeat phrase (0/2) 2  Language : Fluency (0/1) 0  Abstraction (0/2) 2  Delayed Recall (0/5) 0  Orientation (0/6) 2  Total 10  Adjusted Score (based on education) 10      Immunization History  Administered Date(s) Administered    . Influenza Whole 07/25/2007, 07/14/2008, 08/19/2009, 07/13/2010  . Influenza, High Dose Seasonal PF 08/15/2013  . Influenza,inj,Quad PF,6+ Mos 06/27/2015, 07/11/2016  . Influenza-Unspecified 06/16/2014, 07/06/2017  . Pneumococcal Conjugate-13 12/29/2013  . Pneumococcal Polysaccharide-23 08/14/2014  . Td 10/16/2001  . Tetanus 12/29/2013  . Zoster 10/16/2013   Screening Tests Health Maintenance  Topic Date Due  . MAMMOGRAM  07/31/2019 (Originally 03/20/2010)  . DEXA SCAN  07/31/2019 (Originally 04/22/2010)  . TETANUS/TDAP  12/30/2023  . COLONOSCOPY  08/04/2025  . INFLUENZA VACCINE  Completed  . Hepatitis C Screening  Completed  . PNA vac Low Risk Adult  Completed      Plan:     I have personally reviewed and addressed the Medicare Annual Wellness questionnaire and have noted the following in the patient's chart:  A. Medical and social history B. Use of alcohol, tobacco or illicit drugs  C. Current medications and supplements D. Functional ability and status E.  Nutritional status F.  Physical activity G. Advance directives H. List of other physicians I.  Hospitalizations, surgeries, and ER visits in previous 12 months J.  [place] to include hearing, vision, cognitive, depression L. Referrals and appointments - none  In addition, I have reviewed and discussed with patient certain preventive protocols, quality metrics, and best practice recommendations. A written personalized care plan for preventive services as well as general preventive health recommendations were provided to patient.  See attached scanned questionnaire for additional information.   Signed,   Lindell Noe, MHA, BS, LPN Health Coach

## 2017-07-30 NOTE — Patient Instructions (Signed)
Ms. Morss , Thank you for taking time to come for your Medicare Wellness Visit. I appreciate your ongoing commitment to your health goals. Please review the following plan we discussed and let me know if I can assist you in the future.   These are the goals we discussed: Goals    . Increase physical activity          Starting 07/30/2017, I will continue to walk at least 30 min daily as weather permits.        This is a list of the screening recommended for you and due dates:  Health Maintenance  Topic Date Due  . Mammogram  07/31/2019*  . DEXA scan (bone density measurement)  07/31/2019*  . Tetanus Vaccine  12/30/2023  . Colon Cancer Screening  08/04/2025  . Flu Shot  Completed  .  Hepatitis C: One time screening is recommended by Center for Disease Control  (CDC) for  adults born from [birth year] through 1966.   Completed  . Pneumonia vaccines  Completed  *Topic was postponed. The date shown is not the original due date.   Preventive Care for Adults  A healthy lifestyle and preventive care can promote health and wellness. Preventive health guidelines for adults include the following key practices.  . A routine yearly physical is a good way to check with your health care provider about your health and preventive screening. It is a chance to share any concerns and updates on your health and to receive a thorough exam.  . Visit your dentist for a routine exam and preventive care every 6 months. Brush your teeth twice a day and floss once a day. Good oral hygiene prevents tooth decay and gum disease.  . The frequency of eye exams is based on your age, health, family medical history, use  of contact lenses, and other factors. Follow your health care provider's ecommendations for frequency of eye exams.  . Eat a healthy diet. Foods like vegetables, fruits, whole grains, low-fat dairy products, and lean protein foods contain the nutrients you need without too many calories. Decrease your  intake of foods high in solid fats, added sugars, and salt. Eat the right amount of calories for you. Get information about a proper diet from your health care provider, if necessary.  . Regular physical exercise is one of the most important things you can do for your health. Most adults should get at least 150 minutes of moderate-intensity exercise (any activity that increases your heart rate and causes you to sweat) each week. In addition, most adults need muscle-strengthening exercises on 2 or more days a week.  Silver Sneakers may be a benefit available to you. To determine eligibility, you may visit the website: www.silversneakers.com or contact program at [phone] Mon-Fri between 8AM-8PM.   . Maintain a healthy weight. The body mass index (BMI) is a screening tool to identify possible weight problems. It provides an estimate of body fat based on height and weight. Your health care provider can find your BMI and can help you achieve or maintain a healthy weight.   For adults 20 years and older: ? A BMI below 18.5 is considered underweight. ? A BMI of 18.5 to 24.9 is normal. ? A BMI of 25 to 29.9 is considered overweight. ? A BMI of 30 and above is considered obese.   . Maintain normal blood lipids and cholesterol levels by exercising and minimizing your intake of saturated fat. Eat a balanced diet with plenty of fruit  and vegetables. Blood tests for lipids and cholesterol should begin at age 18 and be repeated every 5 years. If your lipid or cholesterol levels are high, you are over 50, or you are at high risk for heart disease, you may need your cholesterol levels checked more frequently. Ongoing high lipid and cholesterol levels should be treated with medicines if diet and exercise are not working.  . If you smoke, find out from your health care provider how to quit. If you do not use tobacco, please do not start.  . If you choose to drink alcohol, please do not consume more than 2  drinks per day. One drink is considered to be 12 ounces (355 mL) of beer, 5 ounces (148 mL) of wine, or 1.5 ounces (44 mL) of liquor.  . If you are 86-5 years old, ask your health care provider if you should take aspirin to prevent strokes.  . Use sunscreen. Apply sunscreen liberally and repeatedly throughout the day. You should seek shade when your shadow is shorter than you. Protect yourself by wearing long sleeves, pants, a wide-brimmed hat, and sunglasses year round, whenever you are outdoors.  . Once a month, do a whole body skin exam, using a mirror to look at the skin on your back. Tell your health care provider of new moles, moles that have irregular borders, moles that are larger than a pencil eraser, or moles that have changed in shape or color.

## 2017-07-30 NOTE — Progress Notes (Signed)
PCP notes:   Health maintenance:  No gaps identified.  Abnormal screenings:   Depression score: 13 Hearing - failed  Hearing Screening   125Hz  250Hz  500Hz  1000Hz  2000Hz  3000Hz  4000Hz  6000Hz  8000Hz   Right ear:   0 0 0  0    Left ear:   0 0 0  0      Patient concerns:   Pain in both shoulders and numbness in right hand.   Nurse concerns:  None  Next PCP appt:   08/06/17 @ 1015  I reviewed health advisor's note, was available for consultation on the day of service listed in this note, and agree with documentation and plan. [name], MD.

## 2017-07-30 NOTE — Progress Notes (Signed)
Pre visit review using our clinic review tool, if applicable. No additional management support is needed unless otherwise documented below in the visit note. 

## 2017-08-01 ENCOUNTER — Ambulatory Visit (INDEPENDENT_AMBULATORY_CARE_PROVIDER_SITE_OTHER): Payer: Medicare Other | Admitting: *Deleted

## 2017-08-01 DIAGNOSIS — I639 Cerebral infarction, unspecified: Secondary | ICD-10-CM | POA: Diagnosis not present

## 2017-08-02 NOTE — Progress Notes (Signed)
[facility] Summary Report / Loop Recorder 

## 2017-08-03 LAB — CUP PACEART REMOTE DEVICE CHECK
Date Time Interrogation Session: 20181018024039
Implantable Pulse Generator Implant Date: 20151230

## 2017-08-06 ENCOUNTER — Ambulatory Visit (INDEPENDENT_AMBULATORY_CARE_PROVIDER_SITE_OTHER): Payer: Medicare Other | Admitting: Family Medicine

## 2017-08-06 ENCOUNTER — Encounter: Payer: Self-pay | Admitting: Family Medicine

## 2017-08-06 VITALS — BP 110/62 | HR 95 | Temp 97.9°F | Ht 64.0 in | Wt 134.2 lb

## 2017-08-06 DIAGNOSIS — G894 Chronic pain syndrome: Secondary | ICD-10-CM

## 2017-08-06 DIAGNOSIS — I10 Essential (primary) hypertension: Secondary | ICD-10-CM | POA: Diagnosis not present

## 2017-08-06 DIAGNOSIS — E785 Hyperlipidemia, unspecified: Secondary | ICD-10-CM

## 2017-08-06 DIAGNOSIS — R7989 Other specified abnormal findings of blood chemistry: Secondary | ICD-10-CM

## 2017-08-06 DIAGNOSIS — Z7189 Other specified counseling: Secondary | ICD-10-CM

## 2017-08-06 DIAGNOSIS — I639 Cerebral infarction, unspecified: Secondary | ICD-10-CM

## 2017-08-06 DIAGNOSIS — F329 Major depressive disorder, single episode, unspecified: Secondary | ICD-10-CM

## 2017-08-06 DIAGNOSIS — F32A Depression, unspecified: Secondary | ICD-10-CM

## 2017-08-06 DIAGNOSIS — H9190 Unspecified hearing loss, unspecified ear: Secondary | ICD-10-CM | POA: Diagnosis not present

## 2017-08-06 DIAGNOSIS — R634 Abnormal weight loss: Secondary | ICD-10-CM | POA: Diagnosis not present

## 2017-08-06 DIAGNOSIS — Z0000 Encounter for general adult medical examination without abnormal findings: Secondary | ICD-10-CM

## 2017-08-06 LAB — CBC WITH DIFFERENTIAL/PLATELET
BASOS ABS: 0.1 10*3/uL (ref 0.0–0.1)
Basophils Relative: 1.2 % (ref 0.0–3.0)
Eosinophils Absolute: 0.3 10*3/uL (ref 0.0–0.7)
Eosinophils Relative: 4 % (ref 0.0–5.0)
HCT: 45.9 % (ref 36.0–46.0)
HEMOGLOBIN: 14.5 g/dL (ref 12.0–15.0)
LYMPHS ABS: 1.6 10*3/uL (ref 0.7–4.0)
Lymphocytes Relative: 21.6 % (ref 12.0–46.0)
MCHC: 31.6 g/dL (ref 30.0–36.0)
MCV: 80.3 fl (ref 78.0–100.0)
MONO ABS: 0.4 10*3/uL (ref 0.1–1.0)
Monocytes Relative: 5.7 % (ref 3.0–12.0)
NEUTROS PCT: 67.5 % (ref 43.0–77.0)
Neutro Abs: 5 10*3/uL (ref 1.4–7.7)
Platelets: 697 10*3/uL — ABNORMAL HIGH (ref 150.0–400.0)
RBC: 5.71 Mil/uL — AB (ref 3.87–5.11)
RDW: 18.4 % — ABNORMAL HIGH (ref 11.5–15.5)
WBC: 7.5 10*3/uL (ref 4.0–10.5)

## 2017-08-06 LAB — FERRITIN: Ferritin: 11.5 ng/mL (ref 10.0–291.0)

## 2017-08-06 LAB — TSH: TSH: 0.55 u[IU]/mL (ref 0.35–4.50)

## 2017-08-06 NOTE — Progress Notes (Signed)
Depression score: 13.  Mood d/w pt.  Still on baseline meds.  Related to her baseline level of pain.  We discussed the fact that her memory, pain, mood, weight loss may all be related. It is very likely the case that her pain level influences her appetite and mood. Memory loss could affect her mood and her appetite. All discussed.   Flu prev done.  Shingles 2015 PNA 2016  Tetanus 2015  Colonoscopy 2016 Breast cancer screening prev deferred for now, given her other medical problems. No lumps or masses noted by patient.  Advance directive- husband designated if patient were incapacitated.   Hearing - failed.  She got hearing aids but they didn't help much.  She had wax noted on exam B. Removed today B with curet without complication. There was no trauma to the left ear canal but there was chapped skin noted in the canal after the wax was removed. She could hear better after wax removal.  Hypertension:    Using medication without problems or lightheadedness: yes Chest pain with exertion:no Edema:no Short of breath:no Labs d/w pt.    Elevated Cholesterol: Using medications without problems:yes Muscle aches: yes, likely not from statin.   Diet compliance: encouraged.  Exercise:limited by pain.  Labs d/w pt.  Lipids at goal.    Chronic pain.  Pain in both shoulders and numbness in right hand.  Followed at pain clinic.  Is due for f/u injection soon.  She wears a glove and that helps some with pain.    Weight loss noted.  CMET and lipids unremarkable.  She is eating the same diet per husband report but small volume of food per meal.  Memory loss noted, with need for supervision by husband.  Still on aricept at baseline.  No vomiting, no diarrhea.  No blood in stool.  No fevers.  No rash.    PMH and SH reviewed  Meds, vitals, and allergies reviewed.   ROS: Per HPI unless specifically indicated in ROS section   GEN: nad, alert and pleasant but she repeats herself and has difficulty with  short-term memory. HEENT: mucous membranes moist, bilateral cerumen impaction noted, removed with curet. No trauma to the ear canals or eardrums left canal was noted to have chapped skin. She can hear better after wax removal. NECK: supple w/o LA CV: rrr. PULM: ctab, no inc wob ABD: soft, +bs EXT: no edema SKIN: no acute rash

## 2017-08-06 NOTE — Patient Instructions (Addendum)
Stop the lipitor for 1 week and see if the aches get better.  If so, then let me know.  If not, then restart the medicine.  Go to the lab on the way out.  We'll contact you with your lab report. Take care.  Glad to see you.

## 2017-08-07 DIAGNOSIS — Z0000 Encounter for general adult medical examination without abnormal findings: Secondary | ICD-10-CM | POA: Insufficient documentation

## 2017-08-07 NOTE — Assessment & Plan Note (Signed)
She is eating the same foods as she did previously but taking smaller volumes. This may be the issue with weight loss. Unclear how much of this is also related to chronic pain versus dementia versus depression. Discussed with patient and husband. Recheck routine labs today. Recent labs discussed with patient, unremarkable. No blood in stool. No vomiting. No fevers.

## 2017-08-07 NOTE — Assessment & Plan Note (Signed)
Flu prev done.  Shingles 2015 PNA 2016  Tetanus 2015  Colonoscopy 2016 Breast cancer screening prev deferred for now, given her other medical problems. No lumps or masses noted by patient.  Advance directive- husband designated if patient were incapacitated.

## 2017-08-07 NOTE — Assessment & Plan Note (Signed)
Improve some after wax removal.

## 2017-08-07 NOTE — Assessment & Plan Note (Signed)
Advance directive- husband designated if patient were incapacitated.  

## 2017-08-07 NOTE — Assessment & Plan Note (Signed)
I would not change her medications today. She has follow-up with the pain clinic pending for injection. I will defer.

## 2017-08-07 NOTE — Assessment & Plan Note (Signed)
Lipids controlled. No change in statin. Labs discussed with patient and husband.

## 2017-08-07 NOTE — Assessment & Plan Note (Signed)
Depression score: 13.  Mood d/w pt.  Still on baseline meds.  Related to her baseline level of pain.  We discussed the fact that her memory, pain, mood, weight loss may all be related. It is very likely the case that her pain level influences her appetite and mood. Memory loss could affect her mood and her appetite. All discussed.  The goal at this point is to get her pain under better control through pain clinic management and also workup for weight loss. Discussed with patient and husband. All agree.

## 2017-08-07 NOTE — Assessment & Plan Note (Signed)
Controlled. No change in meds. Labs discussed with patient and husband.

## 2017-08-22 NOTE — Progress Notes (Signed)
[place]  [phone] [phone]  ID: Aimee Jones OB: 18-Feb-1946  MR#: [id number]  NAT#:[id number]  Patient Care Team: Tonia Ghent, MD as PCP - General (Family Medicine) Justice Britain, MD as Consulting Physician (Orthopedic Surgery)  CHIEF COMPLAINT: Leukocytosis, iron deficiency anemia, thrombocytosis.  INTERVAL HISTORY: Patient returns to clinic today for repeat laboratory work and further evaluation.  She continues to have ongoing dementia and is a poor historian.  Much of the history and information is given by her husband. She currently feels well and is at her baseline. She has no neurological complaints. She has a fair appetite, but denies weight loss. She has no chest pain or shortness of breath. She continues to have chronic weakness and fatigue. She denies any nausea, vomiting, constipation, or diarrhea. She has no melena or hematochezia. She has no urinary complaints. Patient offers no further specific complaints today.  REVIEW OF SYSTEMS:   Review of Systems  Constitutional: Positive for malaise/fatigue. Negative for fever and weight loss.  HENT: Negative.  Negative for congestion and hearing loss.   Respiratory: Negative.  Negative for cough and shortness of breath.   Cardiovascular: Negative.  Negative for chest pain and leg swelling.  Gastrointestinal: Negative for abdominal pain, blood in stool, constipation, diarrhea, melena, nausea and vomiting.  Genitourinary: Negative.   Musculoskeletal: Negative.  Negative for back pain, joint pain and neck pain.  Skin: Negative.  Negative for rash.  Neurological: Positive for weakness. Negative for headaches.  Psychiatric/Behavioral: Positive for memory loss. The patient is not nervous/anxious.     As per HPI. Otherwise, a complete review of systems is negative.  PAST MEDICAL HISTORY: Past Medical History:  Diagnosis Date  . Allergy   . Anxiety    panic attacks- on occas.     . Arthritis    osteoporosis - especially hips.   . Asthma   . Confusion   . Dementia   . Depression   . Diverticulosis   . ETOH abuse   . Falls   . GERD (gastroesophageal reflux disease)   . Hyperlipidemia   . Hypertension   . Hyponatremia 07/05/2015  . Memory loss   . Normal 24 hour ambulatory pH monitoring study    good acid suppression  . Observed seizure-like activity ([place]) 07/05/2015   observed by husband , Dr Doy Mince  . Shortness of breath   . Stroke [facility])     PAST SURGICAL HISTORY: Past Surgical History:  Procedure Laterality Date  . ABDOMINAL HYSTERECTOMY    . BREAST SURGERY     3x- biposies   . DOBUTAMINE STRESS ECHO  07/09/2001   normal  . ESOPHAGOGASTRODUODENOSCOPY  06/11/2006   normal  . EYE SURGERY     cataracts removed, IOL- both eyes   . JOINT REPLACEMENT     bilateral hips  . NASAL SINUS SURGERY  1990  . SHOULDER ARTHROSCOPY Left   . TOTAL HIP ARTHROPLASTY     left 1991/right 2008    FAMILY HISTORY Family History  Problem Relation Age of Onset  . Heart block Mother   . Stroke Mother   . Heart attack Father   . Sudden death Brother   . Heart attack Brother   . Hypertension Brother   . Esophageal cancer Brother   . Stroke Brother        ADVANCED DIRECTIVES:    HEALTH MAINTENANCE: Social History   Tobacco Use  . Smoking status: Never Smoker  . Smokeless tobacco: Never  Used  Substance Use Topics  . Alcohol use: No    Alcohol/week: 0.0 oz  . Drug use: No     Allergies  Allergen Reactions  . Tramadol Other (See Comments)    Would avoid.  History of seizure on medication  . Latex Rash  . Penicillins Rash    .Marland KitchenHas patient had a PCN reaction causing immediate rash, facial/tongue/throat swelling, SOB or lightheadedness with hypotension: No Has patient had a PCN reaction causing severe rash involving mucus membranes or skin necrosis: No Has patient had a PCN reaction that required hospitalization No Has patient had a PCN reaction  occurring within the last 10 years: No If all of the above answers are "NO", then may proceed with Cephalosporin use.      Current Outpatient Medications  Medication Sig Dispense Refill  . acetaminophen (TYLENOL) 325 MG tablet Take 2 tablets (650 mg total) by mouth 3 (three) times daily as needed.    Marland Kitchen amLODipine (NORVASC) 2.5 MG tablet TAKE 1 TABLET BY MOUTH EVERY DAY 30 tablet 5  . atorvastatin (LIPITOR) 10 MG tablet TAKE 1 TABLET BY MOUTH EVERY DAY 90 tablet 0  . citalopram (CELEXA) 20 MG tablet TAKE 1 TABLET BY MOUTH EVERY DAY 90 tablet 0  . clopidogrel (PLAVIX) 75 MG tablet TAKE 1 TABLET (75 MG TOTAL) BY MOUTH DAILY.    Marland Kitchen donepezil (ARICEPT) 10 MG tablet TAKE 1 TABLET BY MOUTH DAILY AT BEDTIME 90 tablet 0  . doxepin (SINEQUAN) 25 MG capsule TAKE 1 CAPSULE BY MOUTH DAILY AT BEDTIME 90 capsule 1  . fentaNYL (DURAGESIC - DOSED MCG/HR) 25 MCG/HR patch Place 25 mcg onto the skin every 3 (three) days.    . fluticasone (FLONASE) 50 MCG/ACT nasal spray Place into the nose.    . hydrocortisone 2.5 % cream APPLY RECTALLY 2 TIMES DAILY 28.35 g 0  . ibuprofen (MOTRIN IB) 200 MG tablet Take 2 tablets (400 mg total) by mouth 3 (three) times daily as needed (with food).    . Multiple Vitamins-Minerals (EYE VITAMINS) CAPS Take 1 capsule by mouth daily.    . Omega-3 Fatty Acids (FISH OIL PO) Take by mouth.    . ondansetron (ZOFRAN) 8 MG tablet Take 8 mg by mouth every 8 (eight) hours as needed.     Marland Kitchen oxyCODONE-acetaminophen (PERCOCET) 7.5-325 MG tablet Take 1 tablet by mouth 3 (three) times daily.   0  . pantoprazole (PROTONIX) 20 MG tablet TAKE 1 TABLET BY MOUTH EVERY DAY 90 tablet 0  . polyvinyl alcohol (LIQUIFILM TEARS) 1.4 % ophthalmic solution Place 1 drop into both eyes as needed.     . pregabalin (LYRICA) 75 MG capsule Take 75 mg by mouth 2 (two) times daily.    . SYMBICORT 160-4.5 MCG/ACT inhaler INHALE 2 PUFFS INTO THE LUNGS 2 (TWO) TIMES DAILY AS NEEDED (SHORTNESS OF BREATH). 10.2 Inhaler 7    . vitamin E 400 UNIT capsule Take by mouth.    . Vitamins/Minerals TABS Take by mouth.     No current facility-administered medications for this visit.     OBJECTIVE: Vitals:   08/23/17 1453  BP: 134/79  Pulse: 77  Resp: 20  Temp: 97.9 F (36.6 C)     Body mass index is 23.91 kg/m.    ECOG FS:2 - Symptomatic, <50% confined to bed  General: Very pale, well-developed, well-nourished, no acute distress. Eyes: Pink conjunctiva, anicteric sclera, puffiness under bilateral eyes Lungs: Diminished breath sounds bilaterally. Heart: Regular rate and rhythm. No  rubs, murmurs, or gallops. Abdomen: Soft, nontender, nondistended. No organomegaly noted, normoactive bowel sounds. Musculoskeletal: No edema, cyanosis, or clubbing. Neuro: Alert, occasionally unable to follow conversation, slow responses. Cranial nerves grossly intact. Skin: No rashes or petechiae noted. Psych: Normal affect. Short-term memory loss.   LAB RESULTS:  Lab Results  Component Value Date   NA 140 07/30/2017   K 4.0 07/30/2017   CL 104 07/30/2017   CO2 29 07/30/2017   GLUCOSE 102 (H) 07/30/2017   BUN 10 07/30/2017   CREATININE 1.00 07/30/2017   CALCIUM 9.3 07/30/2017   PROT 6.3 07/30/2017   ALBUMIN 3.9 07/30/2017   AST 17 07/30/2017   ALT 9 07/30/2017   ALKPHOS 47 07/30/2017   BILITOT 0.2 07/30/2017   GFRNONAA 53 (L) 04/11/2017   GFRAA >60 04/11/2017    Lab Results  Component Value Date   WBC 8.3 08/23/2017   NEUTROABS 5.7 08/23/2017   HGB 12.2 08/23/2017   HCT 38.2 08/23/2017   MCV 79.9 (L) 08/23/2017   PLT 642 (H) 08/23/2017   Lab Results  Component Value Date   IRON 63 02/14/2017   TIBC 281 02/14/2017   IRONPCTSAT 22 02/14/2017    Lab Results  Component Value Date   FERRITIN 11.5 08/06/2017     STUDIES: No results found.  ASSESSMENT: Leukocytosis, iron deficiency anemia, thrombocytosis.  PLAN:    1. Iron deficiency anemia: Patient's hemoglobin and iron stores continue to be  within normal limits. Previously, the remainder of her laboratory work was also negative or within normal limits. Colonoscopy and EGD in October 2016 were unrevealing. She does not require IV iron today. Patient last received 510 mg IV Feraheme on May 16, 2016. Patient's and her husband continue to request less frequent follow-up, therefore she will return to clinic in 6 months with repeat laboratory work and further evaluation.  2. Leukocytosis: Resolved. Peripheral blood flow cytometry as well as BCR-ABL mutation are negative. Monitor. 3. Thrombocytosis: Platelets greater than 600 today. JAK-2 mutation negative. Monitor. 4. Hypertension: Resolved. Patient's blood pressure is normal today. Continue current medications.   Patient expressed understanding and was in agreement with this plan. She also understands that She can call clinic at any time with any questions, concerns, or complaints.    Lloyd Huger, MD 08/24/17 1:20 PM

## 2017-08-23 ENCOUNTER — Inpatient Hospital Stay: Payer: Medicare Other

## 2017-08-23 ENCOUNTER — Other Ambulatory Visit: Payer: Self-pay

## 2017-08-23 ENCOUNTER — Encounter: Payer: Self-pay | Admitting: Oncology

## 2017-08-23 ENCOUNTER — Inpatient Hospital Stay: Payer: Medicare Other | Attending: Oncology

## 2017-08-23 ENCOUNTER — Inpatient Hospital Stay (HOSPITAL_BASED_OUTPATIENT_CLINIC_OR_DEPARTMENT_OTHER): Payer: Medicare Other | Admitting: Oncology

## 2017-08-23 VITALS — BP 134/79 | HR 77 | Temp 97.9°F | Resp 20 | Wt 139.3 lb

## 2017-08-23 DIAGNOSIS — D72828 Other elevated white blood cell count: Secondary | ICD-10-CM

## 2017-08-23 DIAGNOSIS — F0390 Unspecified dementia without behavioral disturbance: Secondary | ICD-10-CM | POA: Diagnosis not present

## 2017-08-23 DIAGNOSIS — R5382 Chronic fatigue, unspecified: Secondary | ICD-10-CM | POA: Insufficient documentation

## 2017-08-23 DIAGNOSIS — D473 Essential (hemorrhagic) thrombocythemia: Secondary | ICD-10-CM | POA: Insufficient documentation

## 2017-08-23 DIAGNOSIS — Z880 Allergy status to penicillin: Secondary | ICD-10-CM

## 2017-08-23 DIAGNOSIS — D509 Iron deficiency anemia, unspecified: Secondary | ICD-10-CM

## 2017-08-23 DIAGNOSIS — Z8673 Personal history of transient ischemic attack (TIA), and cerebral infarction without residual deficits: Secondary | ICD-10-CM

## 2017-08-23 DIAGNOSIS — Z79899 Other long term (current) drug therapy: Secondary | ICD-10-CM | POA: Diagnosis not present

## 2017-08-23 DIAGNOSIS — E785 Hyperlipidemia, unspecified: Secondary | ICD-10-CM | POA: Diagnosis not present

## 2017-08-23 DIAGNOSIS — I10 Essential (primary) hypertension: Secondary | ICD-10-CM | POA: Diagnosis not present

## 2017-08-23 DIAGNOSIS — D508 Other iron deficiency anemias: Secondary | ICD-10-CM

## 2017-08-23 DIAGNOSIS — D72829 Elevated white blood cell count, unspecified: Secondary | ICD-10-CM

## 2017-08-23 DIAGNOSIS — K219 Gastro-esophageal reflux disease without esophagitis: Secondary | ICD-10-CM | POA: Diagnosis not present

## 2017-08-23 DIAGNOSIS — D75839 Thrombocytosis, unspecified: Secondary | ICD-10-CM

## 2017-08-23 DIAGNOSIS — R531 Weakness: Secondary | ICD-10-CM | POA: Diagnosis not present

## 2017-08-23 DIAGNOSIS — R5381 Other malaise: Secondary | ICD-10-CM | POA: Insufficient documentation

## 2017-08-23 LAB — CBC WITH DIFFERENTIAL/PLATELET
BASOS ABS: 0.1 10*3/uL (ref 0–0.1)
BASOS PCT: 1 %
EOS ABS: 0.4 10*3/uL (ref 0–0.7)
EOS PCT: 4 %
HCT: 38.2 % (ref 35.0–47.0)
Hemoglobin: 12.2 g/dL (ref 12.0–16.0)
Lymphocytes Relative: 21 %
Lymphs Abs: 1.7 10*3/uL (ref 1.0–3.6)
MCH: 25.6 pg — ABNORMAL LOW (ref 26.0–34.0)
MCHC: 32.1 g/dL (ref 32.0–36.0)
MCV: 79.9 fL — ABNORMAL LOW (ref 80.0–100.0)
MONO ABS: 0.4 10*3/uL (ref 0.2–0.9)
MONOS PCT: 5 %
NEUTROS PCT: 69 %
Neutro Abs: 5.7 10*3/uL (ref 1.4–6.5)
PLATELETS: 642 10*3/uL — AB (ref 150–440)
RBC: 4.78 MIL/uL (ref 3.80–5.20)
RDW: 19.9 % — AB (ref 11.5–14.5)
WBC: 8.3 10*3/uL (ref 3.6–11.0)

## 2017-08-23 NOTE — Progress Notes (Signed)
Patient denies any concerns today.  

## 2017-08-31 ENCOUNTER — Ambulatory Visit (INDEPENDENT_AMBULATORY_CARE_PROVIDER_SITE_OTHER): Payer: Medicare Other | Admitting: *Deleted

## 2017-08-31 DIAGNOSIS — I639 Cerebral infarction, unspecified: Secondary | ICD-10-CM | POA: Diagnosis not present

## 2017-09-03 DIAGNOSIS — H40013 Open angle with borderline findings, low risk, bilateral: Secondary | ICD-10-CM | POA: Diagnosis not present

## 2017-09-03 DIAGNOSIS — H353131 Nonexudative age-related macular degeneration, bilateral, early dry stage: Secondary | ICD-10-CM | POA: Diagnosis not present

## 2017-09-03 DIAGNOSIS — Z961 Presence of intraocular lens: Secondary | ICD-10-CM | POA: Diagnosis not present

## 2017-09-03 DIAGNOSIS — H26491 Other secondary cataract, right eye: Secondary | ICD-10-CM | POA: Diagnosis not present

## 2017-09-03 NOTE — Progress Notes (Signed)
[facility] Summary Report / Loop Recorder 

## 2017-09-05 DIAGNOSIS — Z79899 Other long term (current) drug therapy: Secondary | ICD-10-CM | POA: Diagnosis not present

## 2017-09-05 DIAGNOSIS — M19019 Primary osteoarthritis, unspecified shoulder: Secondary | ICD-10-CM | POA: Diagnosis not present

## 2017-09-05 DIAGNOSIS — M47812 Spondylosis without myelopathy or radiculopathy, cervical region: Secondary | ICD-10-CM | POA: Diagnosis not present

## 2017-09-05 DIAGNOSIS — Z79891 Long term (current) use of opiate analgesic: Secondary | ICD-10-CM | POA: Diagnosis not present

## 2017-09-05 DIAGNOSIS — M792 Neuralgia and neuritis, unspecified: Secondary | ICD-10-CM | POA: Diagnosis not present

## 2017-09-05 DIAGNOSIS — G894 Chronic pain syndrome: Secondary | ICD-10-CM | POA: Diagnosis not present

## 2017-09-17 LAB — CUP PACEART REMOTE DEVICE CHECK
Date Time Interrogation Session: 20181117031816
Implantable Pulse Generator Implant Date: 20151230

## 2017-09-24 ENCOUNTER — Inpatient Hospital Stay
Admission: EM | Admit: 2017-09-24 | Discharge: 2017-09-27 | DRG: 373 | Disposition: A | Discharge status: Home / Self Care | Payer: Medicare Other | Attending: Internal Medicine | Admitting: Internal Medicine

## 2017-09-24 ENCOUNTER — Emergency Department: Payer: Medicare Other

## 2017-09-24 ENCOUNTER — Encounter: Payer: Self-pay | Admitting: Emergency Medicine

## 2017-09-24 ENCOUNTER — Other Ambulatory Visit: Payer: Self-pay | Admitting: Family Medicine

## 2017-09-24 ENCOUNTER — Other Ambulatory Visit: Payer: Self-pay

## 2017-09-24 DIAGNOSIS — R55 Syncope and collapse: Secondary | ICD-10-CM | POA: Diagnosis not present

## 2017-09-24 DIAGNOSIS — G894 Chronic pain syndrome: Secondary | ICD-10-CM | POA: Diagnosis present

## 2017-09-24 DIAGNOSIS — M810 Age-related osteoporosis without current pathological fracture: Secondary | ICD-10-CM | POA: Diagnosis present

## 2017-09-24 DIAGNOSIS — Z8249 Family history of ischemic heart disease and other diseases of the circulatory system: Secondary | ICD-10-CM | POA: Diagnosis not present

## 2017-09-24 DIAGNOSIS — R933 Abnormal findings on diagnostic imaging of other parts of digestive tract: Secondary | ICD-10-CM | POA: Diagnosis not present

## 2017-09-24 DIAGNOSIS — Z96611 Presence of right artificial shoulder joint: Secondary | ICD-10-CM | POA: Diagnosis present

## 2017-09-24 DIAGNOSIS — Z90710 Acquired absence of both cervix and uterus: Secondary | ICD-10-CM | POA: Diagnosis not present

## 2017-09-24 DIAGNOSIS — I10 Essential (primary) hypertension: Secondary | ICD-10-CM | POA: Diagnosis not present

## 2017-09-24 DIAGNOSIS — R109 Unspecified abdominal pain: Secondary | ICD-10-CM | POA: Diagnosis not present

## 2017-09-24 DIAGNOSIS — D508 Other iron deficiency anemias: Secondary | ICD-10-CM

## 2017-09-24 DIAGNOSIS — K920 Hematemesis: Secondary | ICD-10-CM

## 2017-09-24 DIAGNOSIS — R531 Weakness: Secondary | ICD-10-CM | POA: Diagnosis not present

## 2017-09-24 DIAGNOSIS — R1110 Vomiting, unspecified: Secondary | ICD-10-CM | POA: Diagnosis not present

## 2017-09-24 DIAGNOSIS — Z791 Long term (current) use of non-steroidal anti-inflammatories (NSAID): Secondary | ICD-10-CM | POA: Diagnosis not present

## 2017-09-24 DIAGNOSIS — Z7951 Long term (current) use of inhaled steroids: Secondary | ICD-10-CM

## 2017-09-24 DIAGNOSIS — Z79899 Other long term (current) drug therapy: Secondary | ICD-10-CM | POA: Diagnosis not present

## 2017-09-24 DIAGNOSIS — E876 Hypokalemia: Secondary | ICD-10-CM | POA: Diagnosis present

## 2017-09-24 DIAGNOSIS — Z96643 Presence of artificial hip joint, bilateral: Secondary | ICD-10-CM | POA: Diagnosis present

## 2017-09-24 DIAGNOSIS — D509 Iron deficiency anemia, unspecified: Secondary | ICD-10-CM | POA: Diagnosis present

## 2017-09-24 DIAGNOSIS — Z9181 History of falling: Secondary | ICD-10-CM | POA: Diagnosis not present

## 2017-09-24 DIAGNOSIS — Z8673 Personal history of transient ischemic attack (TIA), and cerebral infarction without residual deficits: Secondary | ICD-10-CM | POA: Diagnosis not present

## 2017-09-24 DIAGNOSIS — R932 Abnormal findings on diagnostic imaging of liver and biliary tract: Secondary | ICD-10-CM | POA: Diagnosis not present

## 2017-09-24 DIAGNOSIS — Z7902 Long term (current) use of antithrombotics/antiplatelets: Secondary | ICD-10-CM

## 2017-09-24 DIAGNOSIS — R10814 Left lower quadrant abdominal tenderness: Secondary | ICD-10-CM | POA: Diagnosis not present

## 2017-09-24 DIAGNOSIS — R110 Nausea: Secondary | ICD-10-CM | POA: Diagnosis not present

## 2017-09-24 DIAGNOSIS — D649 Anemia, unspecified: Secondary | ICD-10-CM | POA: Diagnosis not present

## 2017-09-24 DIAGNOSIS — Z800 Family history of malignant neoplasm of digestive organs: Secondary | ICD-10-CM | POA: Diagnosis not present

## 2017-09-24 DIAGNOSIS — Z823 Family history of stroke: Secondary | ICD-10-CM

## 2017-09-24 DIAGNOSIS — R945 Abnormal results of liver function studies: Secondary | ICD-10-CM

## 2017-09-24 DIAGNOSIS — G309 Alzheimer's disease, unspecified: Secondary | ICD-10-CM | POA: Diagnosis present

## 2017-09-24 DIAGNOSIS — K219 Gastro-esophageal reflux disease without esophagitis: Secondary | ICD-10-CM | POA: Diagnosis present

## 2017-09-24 DIAGNOSIS — K831 Obstruction of bile duct: Secondary | ICD-10-CM

## 2017-09-24 DIAGNOSIS — L8990 Pressure ulcer of unspecified site, unspecified stage: Secondary | ICD-10-CM

## 2017-09-24 DIAGNOSIS — K7689 Other specified diseases of liver: Secondary | ICD-10-CM | POA: Diagnosis not present

## 2017-09-24 DIAGNOSIS — Z7982 Long term (current) use of aspirin: Secondary | ICD-10-CM

## 2017-09-24 DIAGNOSIS — A0472 Enterocolitis due to Clostridium difficile, not specified as recurrent: Principal | ICD-10-CM | POA: Diagnosis present

## 2017-09-24 DIAGNOSIS — K8050 Calculus of bile duct without cholangitis or cholecystitis without obstruction: Secondary | ICD-10-CM

## 2017-09-24 DIAGNOSIS — F0280 Dementia in other diseases classified elsewhere without behavioral disturbance: Secondary | ICD-10-CM | POA: Diagnosis not present

## 2017-09-24 DIAGNOSIS — R112 Nausea with vomiting, unspecified: Secondary | ICD-10-CM | POA: Diagnosis not present

## 2017-09-24 DIAGNOSIS — K8020 Calculus of gallbladder without cholecystitis without obstruction: Secondary | ICD-10-CM | POA: Diagnosis not present

## 2017-09-24 DIAGNOSIS — K567 Ileus, unspecified: Secondary | ICD-10-CM

## 2017-09-24 DIAGNOSIS — K838 Other specified diseases of biliary tract: Secondary | ICD-10-CM | POA: Diagnosis not present

## 2017-09-24 DIAGNOSIS — E785 Hyperlipidemia, unspecified: Secondary | ICD-10-CM | POA: Diagnosis not present

## 2017-09-24 DIAGNOSIS — D473 Essential (hemorrhagic) thrombocythemia: Secondary | ICD-10-CM | POA: Diagnosis not present

## 2017-09-24 DIAGNOSIS — R197 Diarrhea, unspecified: Secondary | ICD-10-CM | POA: Diagnosis not present

## 2017-09-24 DIAGNOSIS — D72829 Elevated white blood cell count, unspecified: Secondary | ICD-10-CM | POA: Diagnosis not present

## 2017-09-24 HISTORY — DX: Unspecified convulsions: R56.9

## 2017-09-24 LAB — URINALYSIS, COMPLETE (UACMP) WITH MICROSCOPIC
BACTERIA UA: NONE SEEN
Bilirubin Urine: NEGATIVE
Glucose, UA: NEGATIVE mg/dL
Hgb urine dipstick: NEGATIVE
Ketones, ur: 5 mg/dL — AB
Leukocytes, UA: NEGATIVE
NITRITE: NEGATIVE
PH: 5 (ref 5.0–8.0)
Protein, ur: NEGATIVE mg/dL

## 2017-09-24 LAB — COMPREHENSIVE METABOLIC PANEL
ALT: 10 U/L — ABNORMAL LOW (ref 14–54)
AST: 32 U/L (ref 15–41)
Albumin: 2.9 g/dL — ABNORMAL LOW (ref 3.5–5.0)
Alkaline Phosphatase: 73 U/L (ref 38–126)
Anion gap: 10 (ref 5–15)
BILIRUBIN TOTAL: 0.6 mg/dL (ref 0.3–1.2)
BUN: 32 mg/dL — AB (ref 6–20)
CALCIUM: 8.7 mg/dL — AB (ref 8.9–10.3)
CO2: 20 mmol/L — ABNORMAL LOW (ref 22–32)
CREATININE: 0.9 mg/dL (ref 0.44–1.00)
Chloride: 104 mmol/L (ref 101–111)
GFR calc Af Amer: >60 mL/min (ref >60)
Glucose, Bld: 105 mg/dL — ABNORMAL HIGH (ref 65–99)
POTASSIUM: 4.3 mmol/L (ref 3.5–5.1)
Sodium: 134 mmol/L — ABNORMAL LOW (ref 135–145)
TOTAL PROTEIN: 5.8 g/dL — AB (ref 6.5–8.1)

## 2017-09-24 LAB — CBC
HEMATOCRIT: 34.3 % — AB (ref 35.0–47.0)
Hemoglobin: 10.9 g/dL — ABNORMAL LOW (ref 12.0–16.0)
MCH: 25 pg — ABNORMAL LOW (ref 26.0–34.0)
MCHC: 31.8 g/dL — AB (ref 32.0–36.0)
MCV: 78.6 fL — AB (ref 80.0–100.0)
Platelets: 889 10*3/uL — ABNORMAL HIGH (ref 150–440)
RBC: 4.36 MIL/uL (ref 3.80–5.20)
RDW: 19 % — AB (ref 11.5–14.5)
WBC: 26.2 10*3/uL — AB (ref 3.6–11.0)

## 2017-09-24 LAB — TROPONIN I: Troponin I: <0.03 ng/mL (ref <0.03)

## 2017-09-24 LAB — HEMOGLOBIN AND HEMATOCRIT, BLOOD
HEMATOCRIT: 31.7 % — AB (ref 35.0–47.0)
HEMOGLOBIN: 10.1 g/dL — AB (ref 12.0–16.0)

## 2017-09-24 LAB — LIPASE, BLOOD: LIPASE: 16 U/L (ref 11–51)

## 2017-09-24 MED ORDER — FENTANYL CITRATE (PF) 100 MCG/2ML IJ SOLN
25.0000 ug | Freq: Once | INTRAMUSCULAR | Status: AC
  Administered 2017-09-24: 25 ug via INTRAVENOUS
  Filled 2017-09-24: qty 2

## 2017-09-24 MED ORDER — SODIUM CHLORIDE 0.9 % IV SOLN
8.0000 mg/h | INTRAVENOUS | Status: DC
  Administered 2017-09-25: 8 mg/h via INTRAVENOUS
  Filled 2017-09-24 (×4): qty 80

## 2017-09-24 MED ORDER — ONDANSETRON HCL 4 MG/2ML IJ SOLN
4.0000 mg | Freq: Once | INTRAMUSCULAR | Status: AC
  Administered 2017-09-24: 4 mg via INTRAVENOUS
  Filled 2017-09-24: qty 2

## 2017-09-24 MED ORDER — CIPROFLOXACIN IN D5W 400 MG/200ML IV SOLN
400.0000 mg | Freq: Two times a day (BID) | INTRAVENOUS | Status: DC
  Administered 2017-09-24 – 2017-09-25 (×3): 400 mg via INTRAVENOUS
  Filled 2017-09-24 (×4): qty 200

## 2017-09-24 MED ORDER — DONEPEZIL HCL 5 MG PO TABS
10.0000 mg | ORAL_TABLET | Freq: Every day | ORAL | Status: DC
  Administered 2017-09-24 – 2017-09-26 (×3): 10 mg via ORAL
  Filled 2017-09-24 (×4): qty 2

## 2017-09-24 MED ORDER — ATORVASTATIN CALCIUM 20 MG PO TABS
10.0000 mg | ORAL_TABLET | Freq: Every day | ORAL | Status: DC
  Administered 2017-09-24 – 2017-09-27 (×4): 10 mg via ORAL
  Filled 2017-09-24 (×4): qty 1

## 2017-09-24 MED ORDER — BISACODYL 5 MG PO TBEC: 5.0000 mg | DELAYED_RELEASE_TABLET | Freq: Every day | ORAL | Status: DC | PRN

## 2017-09-24 MED ORDER — DOXEPIN HCL 25 MG PO CAPS
25.0000 mg | ORAL_CAPSULE | Freq: Every day | ORAL | Status: DC
  Administered 2017-09-24 – 2017-09-26 (×3): 25 mg via ORAL
  Filled 2017-09-24 (×4): qty 1

## 2017-09-24 MED ORDER — METRONIDAZOLE IN NACL 5-0.79 MG/ML-% IV SOLN
500.0000 mg | Freq: Three times a day (TID) | INTRAVENOUS | Status: DC
  Administered 2017-09-24 – 2017-09-25 (×5): 500 mg via INTRAVENOUS
  Filled 2017-09-24 (×8): qty 100

## 2017-09-24 MED ORDER — ACETAMINOPHEN 650 MG RE SUPP: 650.0000 mg | Freq: Four times a day (QID) | RECTAL | Status: DC | PRN

## 2017-09-24 MED ORDER — ONDANSETRON HCL 4 MG/2ML IJ SOLN
4.0000 mg | Freq: Four times a day (QID) | INTRAMUSCULAR | Status: DC | PRN
  Administered 2017-09-25 – 2017-09-26 (×2): 4 mg via INTRAVENOUS
  Filled 2017-09-24 (×2): qty 2

## 2017-09-24 MED ORDER — ORAL CARE MOUTH RINSE
15.0000 mL | Freq: Two times a day (BID) | OROMUCOSAL | Status: DC
  Administered 2017-09-25 – 2017-09-27 (×5): 15 mL via OROMUCOSAL

## 2017-09-24 MED ORDER — PANTOPRAZOLE SODIUM 40 MG IV SOLR
40.0000 mg | Freq: Once | INTRAVENOUS | Status: AC
  Administered 2017-09-24: 40 mg via INTRAVENOUS
  Filled 2017-09-24: qty 40

## 2017-09-24 MED ORDER — AMLODIPINE BESYLATE 5 MG PO TABS
2.5000 mg | ORAL_TABLET | Freq: Every day | ORAL | Status: DC
  Administered 2017-09-24 – 2017-09-27 (×4): 2.5 mg via ORAL
  Filled 2017-09-24 (×4): qty 1

## 2017-09-24 MED ORDER — ONDANSETRON HCL 4 MG PO TABS: 4.0000 mg | ORAL_TABLET | Freq: Four times a day (QID) | ORAL | Status: DC | PRN

## 2017-09-24 MED ORDER — ACETAMINOPHEN 325 MG PO TABS
650.0000 mg | ORAL_TABLET | Freq: Four times a day (QID) | ORAL | Status: DC | PRN
  Administered 2017-09-24 – 2017-09-27 (×3): 650 mg via ORAL
  Filled 2017-09-24 (×3): qty 2

## 2017-09-24 MED ORDER — TRAZODONE HCL 50 MG PO TABS
25.0000 mg | ORAL_TABLET | Freq: Every evening | ORAL | Status: DC | PRN
  Administered 2017-09-24 – 2017-09-26 (×3): 25 mg via ORAL
  Filled 2017-09-24 (×3): qty 1

## 2017-09-24 MED ORDER — FENTANYL 25 MCG/HR TD PT72
25.0000 ug | MEDICATED_PATCH | TRANSDERMAL | Status: DC
  Administered 2017-09-24 – 2017-09-27 (×2): 25 ug via TRANSDERMAL
  Filled 2017-09-24 (×2): qty 1

## 2017-09-24 MED ORDER — CITALOPRAM HYDROBROMIDE 20 MG PO TABS
20.0000 mg | ORAL_TABLET | Freq: Every day | ORAL | Status: DC
  Administered 2017-09-24 – 2017-09-27 (×4): 20 mg via ORAL
  Filled 2017-09-24 (×4): qty 1

## 2017-09-24 MED ORDER — IOPAMIDOL (ISOVUE-300) INJECTION 61%
100.0000 mL | Freq: Once | INTRAVENOUS | Status: AC | PRN
  Administered 2017-09-24: 100 mL via INTRAVENOUS

## 2017-09-24 MED ORDER — SODIUM CHLORIDE 0.9 % IV BOLUS (SEPSIS): 500.0000 mL | Freq: Once | INTRAVENOUS | Status: DC

## 2017-09-24 MED ORDER — ENOXAPARIN SODIUM 40 MG/0.4ML ~~LOC~~ SOLN
40.0000 mg | SUBCUTANEOUS | Status: DC
  Administered 2017-09-24: 40 mg via SUBCUTANEOUS
  Filled 2017-09-24: qty 0.4

## 2017-09-24 MED ORDER — CIPROFLOXACIN HCL 500 MG PO TABS
500.0000 mg | ORAL_TABLET | Freq: Once | ORAL | Status: AC
  Administered 2017-09-24: 500 mg via ORAL
  Filled 2017-09-24: qty 1

## 2017-09-24 MED ORDER — SODIUM CHLORIDE 0.9 % IV SOLN
INTRAVENOUS | Status: DC
  Administered 2017-09-24 – 2017-09-27 (×6): via INTRAVENOUS

## 2017-09-24 MED ORDER — METRONIDAZOLE 500 MG PO TABS: 500.0000 mg | ORAL_TABLET | Freq: Once | ORAL | Status: DC

## 2017-09-24 MED ORDER — OXYCODONE-ACETAMINOPHEN 7.5-325 MG PO TABS
1.0000 | ORAL_TABLET | Freq: Three times a day (TID) | ORAL | Status: DC | PRN
  Administered 2017-09-24 – 2017-09-27 (×8): 1 via ORAL
  Filled 2017-09-24 (×9): qty 1

## 2017-09-24 NOTE — ED Provider Notes (Signed)
[facility] ([facility]) Emergency Department Provider Note   ____________________________________________   First MD Initiated Contact with Patient 09/24/17 [phone]     (approximate)  I have reviewed the triage vital signs and the nursing notes.   HISTORY  Chief Complaint Weakness and Coffee Ground Emesis    HPI Aimee Jones is a 71 y.o. female here for evaluation of vomiting and fatigue and nearly passing out  Patient's husband reports last 24 hours the patient is seem to have lost her appetite, did not want to eat breakfast normally or eat much yesterday.  He noted that times while using her walker she seemed very weak, sometimes with some unusual appearance of shaking, and looking as though she was going to pass out.  This morning he felt like she was at risk and could have passed out while trying to stand today.  They called EMS, on EMS arrival patient began having vomiting of black "coffee-ground" emesis along with some mixed small amounts of blood.  Husband reports normal bowel movement yesterday and this morning without any black or bloody stool noted but she has had a history of dark stools in the past.  She does take Protonix.  She also takes aspirin, ibuprofen, oxycodone, and fentanyl patches regularly due to chronic pain in her shoulders and hands  Husband reports no fevers or chills.  No trouble breathing or complaints of anything other than her normal shoulder or hand pain.  Patient reports and denies having any pain other than her left shoulder and right hand.  Does not report seizure chronic conditions.  She denies abdominal pain or trouble breathing.  Review of records to indicate that she has a history of previous C. difficile, also positive fecal occult blood testing in the past.  Past Medical History:  Diagnosis Date  . Allergy   . Anxiety    panic attacks- on occas.   . Arthritis    osteoporosis - especially hips.   . Asthma   . Confusion   .  Dementia   . Depression   . Diverticulosis   . ETOH abuse   . Falls   . GERD (gastroesophageal reflux disease)   . Hyperlipidemia   . Hypertension   . Hyponatremia 07/05/2015  . Memory loss   . Normal 24 hour ambulatory pH monitoring study    good acid suppression  . Observed seizure-like activity ([place]) 07/05/2015   observed by husband , Dr Doy Mince  . Shortness of breath   . Stroke [facility])     Patient Active Problem List   Diagnosis Date Noted  . Healthcare maintenance 08/07/2017  . Headache 11/14/2016  . Hearing loss 05/24/2016  . Enteritis due to Clostridium difficile 05/24/2016  . Leukocytosis 05/12/2016  . Thrombocytosis ([place]) 05/12/2016  . Cervical stenosis of spine 01/10/2016  . Weight loss 12/06/2015  . Dysuria 10/14/2015  . Cerebrovascular accident (CVA) due to bilateral embolism of posterior cerebral arteries ([place]) 09/27/2015  . Chronic pain syndrome 09/27/2015  . Elevated blood pressure 07/07/2015  . Seizure-like activity ([place]) 07/04/2015  . Seizure ([place]) 07/04/2015  . Depression   . GI bleed 06/24/2015  . Frequent falls   . Narcotic abuse ([place])   . Benzodiazepine abuse ([place])   . Alcohol use with alcohol-induced persisting dementia ([place])   . Generalized anxiety disorder   . Hypokalemia   . Hypomagnesemia   . Anemia, iron deficiency   . Urinary retention   . Altered mental status 06/22/2015  . Fall  06/22/2015  . Iron deficiency anemia 06/03/2015  . Medicare annual wellness visit, subsequent 04/23/2015  . Advance care planning 04/23/2015  . Senile purpura ([place]) 04/23/2015  . Abnormal CBC 04/23/2015  . Sleep apnea 12/11/2014  . 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 Stroke ([place])   . Cerebral thrombosis with cerebral infarction ([place]) 10/13/2014  . HLD (hyperlipidemia)   . Facial droop 10/12/2014  . CKD (chronic kidney disease), stage III ([place]) 10/12/2014  . S/P shoulder replacement 08/13/2014  . Postmenopausal HRT (hormone replacement  therapy) 05/31/2014  . Hyponatremia 05/28/2014  . Shoulder pain, bilateral 10/25/2012  . Parkinsonism ([place]) 08/22/2012  . MEMORY LOSS 06/22/2010  . ALCOHOL ABUSE 03/08/2010  . 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 OBSTRUCTIVE CHRONIC BRONCHITIS WITH EXACERBATION 01/02/2008  . GLUCOSE INTOLERANCE 11/15/2007  . GERD 07/25/2007  . OSTEOARTHRITIS 07/25/2007  . PANIC ATTACK 05/01/2007  . Essential hypertension 05/01/2007  . ALLERGIC RHINITIS 05/01/2007  . ASTHMA 05/01/2007  . DIVERTICULOSIS, COLON 05/01/2007  . MENOPAUSAL DISORDER 05/01/2007  . FIBROMYALGIA 05/01/2007    Past Surgical History:  Procedure Laterality Date  . ABDOMINAL HYSTERECTOMY    . BREAST SURGERY     3x- biposies   . COLONOSCOPY WITH PROPOFOL N/A 08/05/2015   Procedure: COLONOSCOPY WITH PROPOFOL;  Surgeon: [name], MD;  Location: [facility] END[facility]COPY;  Service: Endoscopy;  Laterality: N/A;  . DOBUTAMINE STRESS ECHO  07/09/2001   normal  . ESOPHAGOGASTRODUODENOSCOPY  06/11/2006   normal  . ESOPHAGOGASTRODUODENOSCOPY (EGD) WITH PROPOFOL N/A 08/05/2015   Procedure: ESOPHAGOGASTRODUODENOSCOPY (EGD) WITH PROPOFOL;  Surgeon: [name], MD;  Location: [facility] ENDOSCOPY;  Service: Endoscopy;  Laterality: N/A;  . EYE SURGERY     cataracts removed, IOL- both eyes   . JOINT REPLACEMENT     bilateral hips  . LOOP RECORDER IMPLANT N/A 10/14/2014   Procedure: LOOP RECORDER IMPLANT;  Surgeon: Evans Lance, MD;  [facility];  Service: Cardiovascular;  Laterality: N/A;  . NASAL SINUS SURGERY  1990  . REVERSE SHOULDER ARTHROPLASTY Right 08/13/2014   Procedure: REVERSE SHOULDER ARTHROPLASTY;  Surgeon: Marin Shutter, MD;  Location: [place];  Service: Orthopedics;  Laterality: Right;  interscalene block  . SHOULDER ARTHROSCOPY Left   . TEE WITHOUT CARDIOVERSION N/A 10/14/2014   Procedure: TRANSESOPHAGEAL ECHOCARDIOGRAM (TEE);  Surgeon: Sueanne Margarita, MD;  Location: [place];  Service: Cardiovascular;  Laterality:  N/A;  . TOTAL HIP ARTHROPLASTY     left 1991/right 2008  . ULNAR NERVE TRANSPOSITION Right 08/01/2016   Procedure: RIGHT ULNAR NEUROPLASTY AT THE ELBOW;  Surgeon: Milly Jakob, MD;  Location: [place];  Service: Orthopedics;  Laterality: Right;    Prior to Admission medications   Medication Sig Start Date End Date Taking? Authorizing Provider  acetaminophen (TYLENOL) 325 MG tablet Take 2 tablets (650 mg total) by mouth 3 (three) times daily as needed. 11/13/16  Yes Tonia Ghent, MD  amLODipine (NORVASC) 2.5 MG tablet TAKE 1 TABLET BY MOUTH EVERY DAY 02/21/17  Yes Tonia Ghent, MD  atorvastatin (LIPITOR) 10 MG tablet TAKE 1 TABLET BY MOUTH EVERY DAY 07/26/17  Yes Tonia Ghent, MD  citalopram (CELEXA) 20 MG tablet TAKE 1 TABLET BY MOUTH EVERY DAY 07/26/17  Yes Tonia Ghent, MD  clopidogrel (PLAVIX) 75 MG tablet TAKE 1 TABLET (75 MG TOTAL) BY MOUTH DAILY. 12/16/15  Yes [provider]  donepezil (ARICEPT) 10 MG tablet TAKE 1 TABLET BY MOUTH DAILY AT BEDTIME 07/26/17  Yes  Tonia Ghent, MD  doxepin [facility]) 25 MG capsule TAKE 1 CAPSULE BY MOUTH DAILY AT BEDTIME 05/15/17  Yes Tonia Ghent, MD  ibuprofen (MOTRIN IB) 200 MG tablet Take 2 tablets (400 mg total) by mouth 3 (three) times daily as needed (with food). 11/13/16  Yes Tonia Ghent, MD  Multiple Vitamins-Minerals (EYE VITAMINS) CAPS Take 1 capsule by mouth daily.   Yes [provider]  Omega-3 Fatty Acids (FISH OIL PO) Take by mouth.   Yes [provider]  oxyCODONE-acetaminophen (PERCOCET) 7.5-325 MG tablet Take 1 tablet by mouth 3 (three) times daily.  04/24/16  Yes [provider]  pantoprazole (PROTONIX) 20 MG tablet TAKE 1 TABLET BY MOUTH EVERY DAY 07/12/17  Yes Tonia Ghent, MD  vitamin E 400 UNIT capsule Take by mouth.   Yes [provider]  Vitamins/Minerals TABS Take by mouth.   Yes [provider]  fentaNYL (DURAGESIC - DOSED MCG/HR) 25  MCG/HR patch Place 25 mcg onto the skin every 3 (three) days.    [provider]  fluticasone (FLONASE) 50 MCG/ACT nasal spray Place into the nose. 03/16/15   [provider]  hydrocortisone 2.5 % cream APPLY RECTALLY 2 TIMES DAILY 12/14/16   Tonia Ghent, MD  ondansetron (ZOFRAN) 8 MG tablet Take 8 mg by mouth every 8 (eight) hours as needed.  11/26/15   [provider]  polyvinyl alcohol (LIQUIFILM TEARS) 1.4 % ophthalmic solution Place 1 drop into both eyes as needed.     [provider]  SYMBICORT 160-4.5 MCG/ACT inhaler INHALE 2 PUFFS INTO THE LUNGS 2 (TWO) TIMES DAILY AS NEEDED (SHORTNESS OF BREATH). 02/05/17   Tonia Ghent, MD    Allergies Tramadol; Latex; and Penicillins  Family History  Problem Relation Age of Onset  . Heart block Mother   . Stroke Mother   . Heart attack Father   . Sudden death Brother   . Heart attack Brother   . Hypertension Brother   . Esophageal cancer Brother   . Stroke Brother     Social History Social History   Tobacco Use  . Smoking status: Never Smoker  . Smokeless tobacco: Never Used  Substance Use Topics  . Alcohol use: No    Alcohol/week: 0.0 oz  . Drug use: No    Review of Systems -EM caveat somewhat limited due to patient dementia Constitutional: No fever/chills Eyes: No visual changes. ENT: No sore throat. Cardiovascular: Denies chest pain. Respiratory: Denies shortness of breath. Gastrointestinal: No diarrhea.  No constipation. Genitourinary: Negative for dysuria. Musculoskeletal: Negative for back pain. Skin: Negative for rash. Neurological: Negative for headaches, focal weakness or numbness.    ____________________________________________   PHYSICAL EXAM:  VITAL SIGNS: ED Triage Vitals  Enc Vitals Group     BP 09/24/17 0748 105/70     Pulse Rate 09/24/17 0748 88     Resp 09/24/17 0748 13     Temp 09/24/17 0748 98 F (36.7 C)     Temp Source 09/24/17 0748 Oral     SpO2  09/24/17 0748 92 %     Weight 09/24/17 0753 136 lb (61.7 kg)     Height 09/24/17 0753 5\' 6"  (1.676 m)     Head Circumference --      Peak Flow --      Pain Score --      Pain Loc --      Pain Edu? --      Excl. in  GC? --     Constitutional: Alert and oriented to self and husband but not to date or place.  Appears somewhat pale and mildly ill but in no distress. Eyes: Conjunctivae are rather pale Head: Atraumatic. Nose: No congestion/rhinnorhea. Mouth/Throat: Mucous membranes are slightly dry, there is some black dried emesis about the patient's mouth. Neck: No stridor.   Cardiovascular: Normal rate, regular rhythm. Grossly normal heart sounds.  Good peripheral circulation. Respiratory: Normal respiratory effort.  No retractions. Lungs CTAB. Gastrointestinal: Soft and nontender. No distention.  No peritonitis.  No distention.  Brown formed stool in the rectum which is guaiac negative. Musculoskeletal: No lower extremity tenderness nor edema. Neurologic:  Normal speech and language though she is rather soft spoken. No gross focal neurologic deficits are appreciated.  Skin:  Skin is warm, dry and intact. No rash noted. Psychiatric: Mood and affect are flat. Speech and behavior are somewhat flattened.  ____________________________________________   LABS (all labs ordered are listed, but only abnormal results are displayed)  Labs Reviewed  CBC - Abnormal; Notable for the following components:      Result Value   WBC 26.2 (*)    Hemoglobin 10.9 (*)    HCT 34.3 (*)    MCV 78.6 (*)    MCH 25.0 (*)    MCHC 31.8 (*)    RDW 19.0 (*)    Platelets 889 (*)    All other components within normal limits  COMPREHENSIVE METABOLIC PANEL - Abnormal; Notable for the following components:   Sodium 134 (*)    CO2 20 (*)    Glucose, Bld 105 (*)    BUN 32 (*)    Calcium 8.7 (*)    Total Protein 5.8 (*)    Albumin 2.9 (*)    ALT 10 (*)    All other components within normal limits    URINALYSIS, COMPLETE (UACMP) WITH MICROSCOPIC - Abnormal; Notable for the following components:   Color, Urine AMBER (*)    APPearance CLEAR (*)    Specific Gravity, Urine >1.046 (*)    Ketones, ur 5 (*)    Squamous Epithelial / LPF 0-5 (*)    All other components within normal limits  HEMOGLOBIN AND HEMATOCRIT, BLOOD - Abnormal; Notable for the following components:   Hemoglobin 10.1 (*)    HCT 31.7 (*)    All other components within normal limits  CULTURE, BLOOD (ROUTINE X 2)  CULTURE, BLOOD (ROUTINE X 2)  LIPASE, BLOOD  TROPONIN I  TYPE AND SCREEN  TYPE AND SCREEN   ____________________________________________  EKG  Reviewed and interpreted by me at 8 AM Heart rate 80 QRS 95 QTC 430 Normal sinus rhythm, no evidence of ischemia.  Somewhat low voltage across all leads ____________________________________________  RADIOLOGY  Dg Abdomen 1 View  Result Date: 09/24/2017 CLINICAL DATA:  Episode of coughing ground emesis this morning. The patient is pale and is reporting increasing weakness over several months. EXAM: ABDOMEN - 1 VIEW COMPARISON:  Abdominal and pelvic CT scan of September 22nd 2016. FINDINGS: The bowel gas pattern is normal. There are no abnormal soft tissue calcifications. There are degenerative changes of the lower lumbar spine. There prosthetic hip joints bilaterally. IMPRESSION: No acute intra-abdominal abnormality is observed. Electronically Signed   By: David  Martinique M.D.   On: 09/24/2017 08:36   Ct Abdomen Pelvis W Contrast  Result Date: 09/24/2017 CLINICAL DATA:  Weakness and coffee ground emesis. EXAM: CT ABDOMEN AND PELVIS WITH CONTRAST TECHNIQUE: Multidetector CT imaging of  the abdomen and pelvis was performed using the standard protocol following bolus administration of intravenous contrast. CONTRAST:  188mL ISOVUE-300 IOPAMIDOL (ISOVUE-300) INJECTION 61% COMPARISON:  07/08/2015 FINDINGS: Lower chest: No acute abnormality. Hepatobiliary: Stable  scattered cysts in both lobes of the liver. The gallbladder is distended but not obviously inflamed by CT. There is evidence of extrahepatic biliary ductal dilatation with the common bile duct measuring approximately 9-10 mm. There appears to be some debris in the distal aspect of the common bile duct and potentially calculus or calculi within the terminal CBD near the ampulla. No obvious obstructing mass is identified at the level of the ampulla or pancreatic head. Pancreas: Unremarkable. No pancreatic ductal dilatation or surrounding inflammatory changes. Spleen: The pancreatic duct is mildly dilated in the head of the pancreas measuring 3 mm. The pancreas itself is mildly atrophic. No obvious pancreatic mass identified. Adrenals/Urinary Tract: Adrenal glands are unremarkable. Kidneys are normal, without renal calculi, focal lesion, or hydronephrosis. Bladder is unremarkable. Stomach/Bowel: There is diffuse dilatation of the ascending, transverse and proximal descending colon which are filled with fluid. Findings may be consistent with enteritis/relative ileus of the colon. There is no visible obvious obstructing mass. Stool is present in the distal portion of the colon. The small bowel is nondilated. No free air identified. Vascular/Lymphatic: Atherosclerosis of the abdominal aorta present without evidence of aneurysm. No enlarged lymph nodes are identified in the abdomen or pelvis. Reproductive: Status post hysterectomy. No adnexal masses. Other: No abdominal wall hernia or abnormality. No abdominopelvic ascites. Musculoskeletal: Mild degenerative disc disease of the lumbar spine present. IMPRESSION: 1. Dilated common bile duct measuring approximately 9-10 mm. Probable debris/calculi within the common duct including potentially an obstructing calculus/calculi in the distal duct. Consider further evaluation with MRCP versus ERCP. There is associated distention of the gallbladder without overt gallbladder  inflammation by CT. 2. Dilatation of the colon containing fluid. This may be consistent with enteritis/ileus. 3. Stable hepatic cysts. Electronically Signed   By: Aletta Edouard M.D.   On: 09/24/2017 09:42   Dg Chest Portable 1 View  Result Date: 09/24/2017 CLINICAL DATA:  Increasing weakness for the past few months. Coffee-ground emesis on the way to the hospital. History of previous CVA, hypertension, and dementia. EXAM: PORTABLE CHEST 1 VIEW COMPARISON:  PA and lateral chest x-ray of November 03, 2015 FINDINGS: The lungs are adequately inflated. The heart and pulmonary vascularity are normal. The mediastinum is normal in width. There is no pleural effusion. IMPRESSION: There is no acute cardiopulmonary abnormality. Thoracic aortic atherosclerosis. Electronically Signed   By: David  Martinique M.D.   On: 09/24/2017 08:35    CT reviewed, concern for dilated common bile duct, please see full report Chest x-ray reviewed, negative for acute ____________________________________________   PROCEDURES  Procedure(s) performed: None  Procedures  Critical Care performed: No  ____________________________________________   INITIAL IMPRESSION / ASSESSMENT AND PLAN / ED COURSE  Pertinent labs & imaging results that were available during my care of the patient were reviewed by me and considered in my medical decision making (see chart for details).  Differential diagnosis includes, but is not limited to, biliary disease (biliary colic, acute cholecystitis, cholangitis, choledocholithiasis, etc), intrathoracic causes for epigastric abdominal pain including ACS, gastritis, duodenitis, pancreatitis, small bowel or large bowel obstruction, abdominal aortic aneurysm, hernia, and gastritis.   Clinical Course as of Sep 24 1209  Mon Sep 24, 2017  5400 Ct reviewed. Concerns for dilated CBD. Paged Dr. Marius Ditch at this time for ongoing treatment  recommendations and consult.  [MQ]  [id number] Elevated white count is  notable, the patient does not have any obstructive picture on hepatic function testing, is afebrile, is not complaining of any associated abdominal pain at present. CT concerning for biliary obstruction however.  [MQ]  100 Husband reports the patient was complaining of abdominal pain earlier, no longer.  Patient reports that she is just having pain in her upper shoulders at present, which is a chronic pain.  He is currently denies abdominal pain.  [MQ]  1115 3rd page to Dr. Marius Ditch.  [MQ]  1131 Dr. Marius Ditch recommends admission and will see patient in GI consult.   [MQ]    Clinical Course User Index [MQ] Delman Kitten, MD    ----------------------------------------- 12:10 PM on 09/24/2017 -----------------------------------------  Spoke with Dr. Marius Ditch, she recommends treatment with Cipro and Flagyl orally and that the patient may take clear liquids up until midnight.  Patient will be admitted, she reports that they will be able to perform ERCP for her planned tomorrow as well as likely endoscopy.  Gastroenterology will provide consultation today and patient will be admitted to the hospitalist service.  Patient has been in the patient agreeable with the plan. ____________________________________________   FINAL CLINICAL IMPRESSION(S) / ED DIAGNOSES  Final diagnoses:  Biliary obstruction  Coffee ground emesis      NEW MEDICATIONS STARTED DURING THIS VISIT:  This SmartLink is deprecated. Use AVSMEDLIST instead to display the medication list for a patient.   Note:  This document was prepared using Dragon voice recognition software and may include unintentional dictation errors.     Delman Kitten, MD 09/24/17 [phone]

## 2017-09-24 NOTE — Progress Notes (Signed)
ANTIBIOTIC CONSULT NOTE - INITIAL  Pharmacy Consult for ciprofloxacin Indication: IAI  Allergies  Allergen Reactions  . Tramadol Other (See Comments)    Would avoid.  History of seizure on medication  . Latex Rash  . Penicillins Rash    .Marland KitchenHas patient had a PCN reaction causing immediate rash, facial/tongue/throat swelling, SOB or lightheadedness with hypotension: No Has patient had a PCN reaction causing severe rash involving mucus membranes or skin necrosis: No Has patient had a PCN reaction that required hospitalization No Has patient had a PCN reaction occurring within the last 10 years: No If all of the above answers are "NO", then may proceed with Cephalosporin use.      Patient Measurements: Height: 5\' 6"  (167.6 cm) Weight: 136 lb (61.7 kg) IBW/kg (Calculated) : 59.3 Adjusted Body Weight:   Vital Signs: Temp: 98 F (36.7 C) (12/10 0748) Temp Source: Oral (12/10 0748) BP: 129/85 (12/10 1100) Pulse Rate: 88 (12/10 1100) Intake/Output from previous day: No intake/output data recorded. Intake/Output from this shift: No intake/output data recorded.  Labs: Recent Labs    09/24/17 0758 09/24/17 1043  WBC 26.2*  --   HGB 10.9* 10.1*  PLT 889*  --   CREATININE 0.90  --    Estimated Creatinine Clearance: 52.9 mL/min (by C-G formula based on SCr of 0.9 mg/dL). No results for input(s): VANCOTROUGH, VANCOPEAK, VANCORANDOM, GENTTROUGH, GENTPEAK, GENTRANDOM, TOBRATROUGH, TOBRAPEAK, TOBRARND, AMIKACINPEAK, AMIKACINTROU, AMIKACIN in the last 72 hours.   Microbiology: No results found for this or any previous visit (from the past 720 hour(s)).  Medical History: Past Medical History:  Diagnosis Date  . Allergy   . Anxiety    panic attacks- on occas.   . Arthritis    osteoporosis - especially hips.   . Asthma   . Confusion   . Dementia   . Depression   . Diverticulosis   . ETOH abuse   . Falls   . GERD (gastroesophageal reflux disease)   . Hyperlipidemia   .  Hypertension   . Hyponatremia 07/05/2015  . Memory loss   . Normal 24 hour ambulatory pH monitoring study    good acid suppression  . Observed seizure-like activity ([place]) 07/05/2015   observed by husband , Dr Doy Mince  . Shortness of breath   . Stroke [facility])     Medications:  Infusions:  . sodium chloride    . ciprofloxacin    . metronidazole    . pantoprozole (PROTONIX) infusion    . sodium chloride     Assessment: 71 yof cc weakness and emesis. Coffee-ground emesis noted, takes protonix daily, as well as ASA, IBU, oxycodone, fentanyl patches. Pharmacy consulted to dose ciprofloxacin for IAI. Of note, patient is now NPO pending GI consult. Will use IV ciprofloxacin for now.  Goal of Therapy:  Resolve infection Prevent ADE  Plan:  Ciprofloxacin 400 mg IV Q12H.  Laural Benes, Pharm.D., BCPS Clinical Pharmacist 09/24/2017,1:17 PM

## 2017-09-24 NOTE — Progress Notes (Signed)
Patient continues to call out in pain.  She keeps asking for her pain med.  Daughter, Seth Bake and husband Barbarann Ehlers are in the room and confirmed that she takes percocet 3 times a day at home and fentanyl patch every 2 days.  Dr Posey Pronto gave order for fentanyl and percocet

## 2017-09-24 NOTE — ED Notes (Signed)
Patient transported to CT 

## 2017-09-24 NOTE — H&P (Signed)
[facility] NAME: Aimee Jones    MR#:  [id number]  DATE OF BIRTH:  December 30, 1945  DATE OF ADMISSION:  09/24/2017  PRIMARY CARE PHYSICIAN: Tonia Ghent, MD   REQUESTING/REFERRING PHYSICIAN: Dr,Mark Jacqualine Code  CHIEF COMPLAINT: Weakness and collapse   Chief Complaint  Patient presents with  . Weakness  . Coffee Ground Emesis    HISTORY OF PRESENT ILLNESS:  Aimee Jones  is a 71 y.o. female with a known history of Alzheimer's dementia, hypertension brought in by EMS because of collapse.  Patient family said she collapsed today and she has been having abdominal pain since yesterday and she had episode of nausea, bilious vomiting in the ambulance today.  Denies any fever, no diarrhea.  P.o. intake has been poor for months and patient's husband says she is going downhill of the past few months.  She has severe dementia and unable to get any history from the history is obtained mainly from her husband who is the  Main caretaker for her.  Husband also noticed that she was shaking this morning.  No history of dark stools.  She is on fentanyl patch, oxycodone secondary to her chronic pain in her shoulders and hands.  She has a history of high platelet count and sees Dr. Grayland Ormond for that.  Chronic nausea.  He has history of for TIAs before and she takes Plavix, last dose of Plavix was last night. PAST MEDICAL HISTORY:   Past Medical History:  Diagnosis Date  . Allergy   . Anxiety    panic attacks- on occas.   . Arthritis    osteoporosis - especially hips.   . Asthma   . Confusion   . Dementia   . Depression   . Diverticulosis   . ETOH abuse   . Falls   . GERD (gastroesophageal reflux disease)   . Hyperlipidemia   . Hypertension   . Hyponatremia 07/05/2015  . Memory loss   . Normal 24 hour ambulatory pH monitoring study    good acid suppression  . Observed seizure-like activity ([place]) 07/05/2015   observed by husband ,  Dr Doy Mince  . Shortness of breath   . Stroke [facility]     PAST SURGICAL HISTOIRY:   Past Surgical History:  Procedure Laterality Date  . ABDOMINAL HYSTERECTOMY    . BREAST SURGERY     3x- biposies   . COLONOSCOPY WITH PROPOFOL N/A 08/05/2015   Procedure: COLONOSCOPY WITH PROPOFOL;  Surgeon: [name], MD;  Location: [facility];  Service: Endoscopy;  Laterality: N/A;  . DOBUTAMINE STRESS ECHO  07/09/2001   normal  . ESOPHAGOGASTRODUODENOSCOPY  06/11/2006   normal  . ESOPHAGOGASTRODUODENOSCOPY (EGD) WITH PROPOFOL N/A 08/05/2015   Procedure: ESOPHAGOGASTRODUODENOSCOPY (EGD) WITH PROPOFOL;  Surgeon: [name], MD;  Location: [facility];  Service: Endoscopy;  Laterality: N/A;  . EYE SURGERY     cataracts removed, IOL- both eyes   . JOINT REPLACEMENT     bilateral hips  . LOOP RECORDER IMPLANT N/A 10/14/2014   Procedure: LOOP RECORDER IMPLANT;  Surgeon: Evans Lance, MD;  Location: [facility] CATH LAB;  Service: Cardiovascular;  Laterality: N/A;  . NASAL SINUS SURGERY  1990  . REVERSE SHOULDER ARTHROPLASTY Right 08/13/2014   Procedure: REVERSE SHOULDER ARTHROPLASTY;  Surgeon: Marin Shutter, MD;  Location: [facility];  Service: Orthopedics;  Laterality: Right;  interscalene block  . SHOULDER ARTHROSCOPY Left   . TEE WITHOUT CARDIOVERSION N/A  10/14/2014   Procedure: TRANSESOPHAGEAL ECHOCARDIOGRAM (TEE);  Surgeon: Sueanne Margarita, MD;  Location: [facility];  Service: Cardiovascular;  Laterality: N/A;  . TOTAL HIP ARTHROPLASTY     left 1991/right 2008  . ULNAR NERVE TRANSPOSITION Right 08/01/2016   Procedure: RIGHT ULNAR NEUROPLASTY AT THE ELBOW;  Surgeon: Milly Jakob, MD;  Location: [facility];  Service: Orthopedics;  Laterality: Right;    SOCIAL HISTORY:   Social History   Tobacco Use  . Smoking status: Never Smoker  . Smokeless tobacco: Never Used  Substance Use Topics  . Alcohol use: No    Alcohol/week: 0.0 oz    FAMILY HISTORY:   Family History   Problem Relation Age of Onset  . Heart block Mother   . Stroke Mother   . Heart attack Father   . Sudden death Brother   . Heart attack Brother   . Hypertension Brother   . Esophageal cancer Brother   . Stroke Brother     DRUG ALLERGIES:   Allergies  Allergen Reactions  . Tramadol Other (See Comments)    Would avoid.  History of seizure on medication  . Latex Rash  . Penicillins Rash    .Marland KitchenHas patient had a PCN reaction causing immediate rash, facial/tongue/throat swelling, SOB or lightheadedness with hypotension: No Has patient had a PCN reaction causing severe rash involving mucus membranes or skin necrosis: No Has patient had a PCN reaction that required hospitalization No Has patient had a PCN reaction occurring within the last 10 years: No If all of the above answers are "NO", then may proceed with Cephalosporin use.      REVIEW OF SYSTEMS:  Unable to obtain review of systems because of dementia MEDICATIONS AT HOME:   Prior to Admission medications   Medication Sig Start Date End Date Taking? Authorizing Provider  acetaminophen (TYLENOL) 325 MG tablet Take 2 tablets (650 mg total) by mouth 3 (three) times daily as needed. 11/13/16  Yes Tonia Ghent, MD  amLODipine (NORVASC) 2.5 MG tablet TAKE 1 TABLET BY MOUTH EVERY DAY 02/21/17  Yes Tonia Ghent, MD  atorvastatin (LIPITOR) 10 MG tablet TAKE 1 TABLET BY MOUTH EVERY DAY 07/26/17  Yes Tonia Ghent, MD  citalopram (CELEXA) 20 MG tablet TAKE 1 TABLET BY MOUTH EVERY DAY 07/26/17  Yes Tonia Ghent, MD  clopidogrel (PLAVIX) 75 MG tablet TAKE 1 TABLET (75 MG TOTAL) BY MOUTH DAILY. 12/16/15  Yes [provider]  donepezil (ARICEPT) 10 MG tablet TAKE 1 TABLET BY MOUTH DAILY AT BEDTIME 07/26/17  Yes Tonia Ghent, MD  doxepin (SINEQUAN) 25 MG capsule TAKE 1 CAPSULE BY MOUTH DAILY AT BEDTIME 05/15/17  Yes Tonia Ghent, MD  ibuprofen (MOTRIN IB) 200 MG tablet Take 2 tablets (400 mg total) by mouth 3 (three)  times daily as needed (with food). 11/13/16  Yes Tonia Ghent, MD  Multiple Vitamins-Minerals (EYE VITAMINS) CAPS Take 1 capsule by mouth daily.   Yes [provider]  Omega-3 Fatty Acids (FISH OIL PO) Take by mouth.   Yes [provider]  oxyCODONE-acetaminophen (PERCOCET) 7.5-325 MG tablet Take 1 tablet by mouth 3 (three) times daily.  04/24/16  Yes [provider]  pantoprazole (PROTONIX) 20 MG tablet TAKE 1 TABLET BY MOUTH EVERY DAY 07/12/17  Yes Tonia Ghent, MD  vitamin E 400 UNIT capsule Take by mouth.   Yes [provider]  Vitamins/Minerals TABS Take by mouth.   Yes [provider]  fentaNYL (DURAGESIC - DOSED MCG/HR) 25 MCG/HR patch Place 25 mcg onto the skin every 3 (three) days.    [provider]  fluticasone (FLONASE) 50 MCG/ACT nasal spray Place into the nose. 03/16/15   [provider]  hydrocortisone 2.5 % cream APPLY RECTALLY 2 TIMES DAILY 12/14/16   Tonia Ghent, MD  ondansetron (ZOFRAN) 8 MG tablet Take 8 mg by mouth every 8 (eight) hours as needed.  11/26/15   [provider]  polyvinyl alcohol (LIQUIFILM TEARS) 1.4 % ophthalmic solution Place 1 drop into both eyes as needed.     [provider]  SYMBICORT 160-4.5 MCG/ACT inhaler INHALE 2 PUFFS INTO THE LUNGS 2 (TWO) TIMES DAILY AS NEEDED (SHORTNESS OF BREATH). 02/05/17   Tonia Ghent, MD      VITAL SIGNS:  Blood pressure 131/80, pulse 99, temperature 98 F (36.7 C), temperature source Oral, resp. rate 18, height 5\' 6"  (1.676 m), weight 61.7 kg (136 lb), SpO2 99 %.  PHYSICAL EXAMINATION:  GENERAL:  71 y.o.-year-old patient lying in the bed with no acute distress.  She  appears very pale. EYES: Pupils equal, round, reactive to light  . No scleral icterus. Extraocular muscles intact.  HEENT: Head atraumatic, normocephalic. Oropharynx and nasopharynx clear.  NECK:  Supple, no jugular venous distention. No thyroid enlargement, no  tenderness.  LUNGS: Normal breath sounds bilaterally, no wheezing, rales,rhonchi or crepitation. No use of accessory muscles of respiration.  CARDIOVASCULAR: S1, S2 normal. No murmurs, rubs, or gallops.  ABDOMEN: Soft, nontender, nondistended. Bowel sounds present. No organomegaly or mass.  EXTREMITIES: No pedal edema, cyanosis, or clubbing.  NEUROLOGIC: No gross focal neurological deficit unable to follow full commands for full neurologic exam because of baseline dementia.  PSYCHIATRIC: The patient is alert and oriented x 3.  SKIN: No obvious rash, lesion, or ulcer.   LABORATORY PANEL:   CBC Recent Labs  Lab 09/24/17 0758 09/24/17 1043  WBC 26.2*  --   HGB 10.9* 10.1*  HCT 34.3* 31.7*  PLT 889*  --    ------------------------------------------------------------------------------------------------------------------  Chemistries  Recent Labs  Lab 09/24/17 0758  NA 134*  K 4.3  CL 104  CO2 20*  GLUCOSE 105*  BUN 32*  CREATININE 0.90  CALCIUM 8.7*  AST 32  ALT 10*  ALKPHOS 73  BILITOT 0.6   ------------------------------------------------------------------------------------------------------------------  Cardiac Enzymes Recent Labs  Lab 09/24/17 0758  TROPONINI <0.03   ------------------------------------------------------------------------------------------------------------------  RADIOLOGY:  Dg Abdomen 1 View  Result Date: 09/24/2017 CLINICAL DATA:  Episode of coughing ground emesis this morning. The patient is pale and is reporting increasing weakness over several months. EXAM: ABDOMEN - 1 VIEW COMPARISON:  Abdominal and pelvic CT scan of September 22nd 2016. FINDINGS: The bowel gas pattern is normal. There are no abnormal soft tissue calcifications. There are degenerative changes of the lower lumbar spine. There prosthetic hip joints bilaterally. IMPRESSION: No acute intra-abdominal abnormality is observed. Electronically Signed   By: David  Martinique M.D.   On:  09/24/2017 08:36   Ct Abdomen Pelvis W Contrast  Result Date: 09/24/2017 CLINICAL DATA:  Weakness and coffee ground emesis. EXAM: CT ABDOMEN AND PELVIS WITH CONTRAST TECHNIQUE: Multidetector CT imaging of the abdomen and pelvis was performed using the standard protocol following bolus administration of intravenous contrast. CONTRAST:  121mL ISOVUE-300 IOPAMIDOL (ISOVUE-300) INJECTION 61% COMPARISON:  07/08/2015 FINDINGS: Lower chest: No acute abnormality. Hepatobiliary: Stable scattered cysts in both lobes of the liver. The gallbladder is distended but not obviously inflamed by  CT. There is evidence of extrahepatic biliary ductal dilatation with the common bile duct measuring approximately 9-10 mm. There appears to be some debris in the distal aspect of the common bile duct and potentially calculus or calculi within the terminal CBD near the ampulla. No obvious obstructing mass is identified at the level of the ampulla or pancreatic head. Pancreas: Unremarkable. No pancreatic ductal dilatation or surrounding inflammatory changes. Spleen: The pancreatic duct is mildly dilated in the head of the pancreas measuring 3 mm. The pancreas itself is mildly atrophic. No obvious pancreatic mass identified. Adrenals/Urinary Tract: Adrenal glands are unremarkable. Kidneys are normal, without renal calculi, focal lesion, or hydronephrosis. Bladder is unremarkable. Stomach/Bowel: There is diffuse dilatation of the ascending, transverse and proximal descending colon which are filled with fluid. Findings may be consistent with enteritis/relative ileus of the colon. There is no visible obvious obstructing mass. Stool is present in the distal portion of the colon. The small bowel is nondilated. No free air identified. Vascular/Lymphatic: Atherosclerosis of the abdominal aorta present without evidence of aneurysm. No enlarged lymph nodes are identified in the abdomen or pelvis. Reproductive: Status post hysterectomy. No adnexal  masses. Other: No abdominal wall hernia or abnormality. No abdominopelvic ascites. Musculoskeletal: Mild degenerative disc disease of the lumbar spine present. IMPRESSION: 1. Dilated common bile duct measuring approximately 9-10 mm. Probable debris/calculi within the common duct including potentially an obstructing calculus/calculi in the distal duct. Consider further evaluation with MRCP versus ERCP. There is associated distention of the gallbladder without overt gallbladder inflammation by CT. 2. Dilatation of the colon containing fluid. This may be consistent with enteritis/ileus. 3. Stable hepatic cysts. Electronically Signed   By: Aletta Edouard M.D.   On: 09/24/2017 09:42   Dg Chest Portable 1 View  Result Date: 09/24/2017 CLINICAL DATA:  Increasing weakness for the past few months. Coffee-ground emesis on the way to the hospital. History of previous CVA, hypertension, and dementia. EXAM: PORTABLE CHEST 1 VIEW COMPARISON:  PA and lateral chest x-ray of November 03, 2015 FINDINGS: The lungs are adequately inflated. The heart and pulmonary vascularity are normal. The mediastinum is normal in width. There is no pleural effusion. IMPRESSION: There is no acute cardiopulmonary abnormality. Thoracic aortic atherosclerosis. Electronically Signed   By: David  Martinique M.D.   On: 09/24/2017 08:35    EKG:   Orders placed or performed during the hospital encounter of 09/24/17  . EKG 12-Lead  . EKG 12-Lead  Normal sinus rhythm with 80 bpm with no evidence of ischemia and low voltage across leads.  IMPRESSION AND PLAN:   71 year old female with Alzheimer's dementia, hypertension brought in by family for near syncope and collapse and also abdominal pain for 2 days associated with bilious/coffee  Ground vomiting   in the ambulance. #1 abdominal pain, bilious vomiting likely secondary to biliary obstruction, CT abdomen dilated common bile duct up to not 10 mm with probable diabetes/calculi in CBD.  Patient  needs ERCP but unfortunately she is on Plavix and last dose of Plavix was yesterday and she needs to be off Plavix at least for 5 days.  Discussed with the gastroenterologist Dr,Vanga; her LFTs are normal.  However she has elevated white count, abdominal pain, we will start on IV antibiotics, IV pain medicines, IV fluids, continue clear liquids and follow the trend of LFTs, CBC. 2.  possible enteritis: Patient already on Cipro, Flagyl. 3.  Alzheimer's dementia: Patient on Aricept, Celexa, doxepin. 4..  Possible coffee-ground vomiting with stable hemoglobin, she is  on Protonix drip,d/w GI Dr.Vanga. History of mini strokes: Patient is on aspirin, statins, Plavix, I held Plavix. 5.  Essential thrombocytosis: Patient platelet count 800 today, follows up with Dr. Grayland Ormond, reconsult him while in the hospital, also has iron deficiency anemia,JAK mutation is negative.  All the records are reviewed and case discussed with ED provider. Management plans discussed with the patient, family and they are in agreement.  CODE STATUS: full  TOTAL TIME TAKING CARE OF THIS PATIENT: 55 minutes.    Epifanio Lesches M.D on 09/24/2017 at 2:30 PM  Between 7am to 6pm - Pager - [phone]  After 6pm go to [email or web address] - password [email or web address] [facility]  Office  [phone]  CC: Primary care physician; Tonia Ghent, MD  Note: This dictation was prepared with Dragon dictation along with smaller phrase technology. Any transcriptional errors that result from this process are unintentional.

## 2017-09-24 NOTE — ED Notes (Signed)
EDP to bedside at this time to provide pt and family with update.

## 2017-09-24 NOTE — Progress Notes (Signed)
Dr Jacqualine Code wrote in his note that he spoke with Dr Marius Ditch and that the patient should be NPO after midnight for an ERCP tomorrow.  Order received from Dr Vianne Bulls for NPO after midnight

## 2017-09-24 NOTE — ED Triage Notes (Signed)
Pt in via [facility] from home with complaints of increasing weakness x a few months.  Pt with one episode of coffee ground emesis en route.  Pt pale upon arrival.  EDP notified and to bedside at this time.

## 2017-09-25 DIAGNOSIS — Z79899 Other long term (current) drug therapy: Secondary | ICD-10-CM

## 2017-09-25 DIAGNOSIS — Z823 Family history of stroke: Secondary | ICD-10-CM

## 2017-09-25 DIAGNOSIS — I10 Essential (primary) hypertension: Secondary | ICD-10-CM

## 2017-09-25 DIAGNOSIS — R109 Unspecified abdominal pain: Secondary | ICD-10-CM

## 2017-09-25 DIAGNOSIS — D473 Essential (hemorrhagic) thrombocythemia: Secondary | ICD-10-CM

## 2017-09-25 DIAGNOSIS — R110 Nausea: Secondary | ICD-10-CM

## 2017-09-25 DIAGNOSIS — K219 Gastro-esophageal reflux disease without esophagitis: Secondary | ICD-10-CM

## 2017-09-25 DIAGNOSIS — D509 Iron deficiency anemia, unspecified: Secondary | ICD-10-CM

## 2017-09-25 DIAGNOSIS — L8990 Pressure ulcer of unspecified site, unspecified stage: Secondary | ICD-10-CM

## 2017-09-25 DIAGNOSIS — E785 Hyperlipidemia, unspecified: Secondary | ICD-10-CM

## 2017-09-25 DIAGNOSIS — F0280 Dementia in other diseases classified elsewhere without behavioral disturbance: Secondary | ICD-10-CM

## 2017-09-25 DIAGNOSIS — Z8673 Personal history of transient ischemic attack (TIA), and cerebral infarction without residual deficits: Secondary | ICD-10-CM

## 2017-09-25 DIAGNOSIS — R55 Syncope and collapse: Secondary | ICD-10-CM

## 2017-09-25 DIAGNOSIS — G309 Alzheimer's disease, unspecified: Secondary | ICD-10-CM

## 2017-09-25 DIAGNOSIS — Z90710 Acquired absence of both cervix and uterus: Secondary | ICD-10-CM

## 2017-09-25 DIAGNOSIS — Z9181 History of falling: Secondary | ICD-10-CM

## 2017-09-25 LAB — CBC
HCT: 28.6 % — ABNORMAL LOW (ref 35.0–47.0)
Hemoglobin: 9 g/dL — ABNORMAL LOW (ref 12.0–16.0)
MCH: 24.5 pg — AB (ref 26.0–34.0)
MCHC: 31.5 g/dL — ABNORMAL LOW (ref 32.0–36.0)
MCV: 77.8 fL — AB (ref 80.0–100.0)
Platelets: 763 10*3/uL — ABNORMAL HIGH (ref 150–440)
RBC: 3.68 MIL/uL — ABNORMAL LOW (ref 3.80–5.20)
RDW: 18.6 % — AB (ref 11.5–14.5)
WBC: 20.4 10*3/uL — ABNORMAL HIGH (ref 3.6–11.0)

## 2017-09-25 LAB — GLUCOSE, CAPILLARY: Glucose-Capillary: 134 mg/dL — ABNORMAL HIGH (ref 65–99)

## 2017-09-25 LAB — COMPREHENSIVE METABOLIC PANEL
ALT: 8 U/L — AB (ref 14–54)
AST: 22 U/L (ref 15–41)
Albumin: 2.3 g/dL — ABNORMAL LOW (ref 3.5–5.0)
Alkaline Phosphatase: 73 U/L (ref 38–126)
Anion gap: 5 (ref 5–15)
BILIRUBIN TOTAL: 0.5 mg/dL (ref 0.3–1.2)
BUN: 25 mg/dL — AB (ref 6–20)
CHLORIDE: 106 mmol/L (ref 101–111)
CO2: 21 mmol/L — ABNORMAL LOW (ref 22–32)
CREATININE: 0.65 mg/dL (ref 0.44–1.00)
Calcium: 8.1 mg/dL — ABNORMAL LOW (ref 8.9–10.3)
GFR calc Af Amer: >60 mL/min (ref >60)
GLUCOSE: 108 mg/dL — AB (ref 65–99)
Potassium: 3.4 mmol/L — ABNORMAL LOW (ref 3.5–5.1)
Sodium: 132 mmol/L — ABNORMAL LOW (ref 135–145)
Total Protein: 4.5 g/dL — ABNORMAL LOW (ref 6.5–8.1)

## 2017-09-25 LAB — C DIFFICILE QUICK SCREEN W PCR REFLEX
C Diff antigen: POSITIVE — AB
C Diff toxin: NEGATIVE

## 2017-09-25 LAB — CLOSTRIDIUM DIFFICILE BY PCR: CDIFFPCR: POSITIVE — AB

## 2017-09-25 MED ORDER — SODIUM CHLORIDE 0.9 % IV SOLN
510.0000 mg | Freq: Once | INTRAVENOUS | Status: AC
  Administered 2017-09-26: 510 mg via INTRAVENOUS
  Filled 2017-09-25: qty 17

## 2017-09-25 MED ORDER — POTASSIUM CHLORIDE CRYS ER 20 MEQ PO TBCR
40.0000 meq | EXTENDED_RELEASE_TABLET | Freq: Once | ORAL | Status: AC
Start: 2017-09-25 — End: 2017-09-25
  Administered 2017-09-25: 40 meq via ORAL
  Filled 2017-09-25: qty 2

## 2017-09-25 MED ORDER — PANTOPRAZOLE SODIUM 40 MG IV SOLR
40.0000 mg | Freq: Two times a day (BID) | INTRAVENOUS | Status: DC
  Administered 2017-09-25 – 2017-09-26 (×4): 40 mg via INTRAVENOUS
  Filled 2017-09-25 (×4): qty 40

## 2017-09-25 NOTE — Progress Notes (Signed)
Per MD okay for RN to place order for PT evaluation.

## 2017-09-25 NOTE — Evaluation (Signed)
Physical Therapy Evaluation Patient Details Name: Aimee Jones MRN: [id number] DOB: 20-Apr-1946 Today's Date: 09/25/2017   History of Present Illness  Pt admitted for abdominal pain. Pt complains of weakness and collapse resulting in fall. History includes Alzheimer's Dx, dementia, HTN, ETOH abuse, and depression.   Clinical Impression  Pt is a pleasant 71 year old female who was admitted for abdominal pain. Pt performs bed mobility with cga, transfers with mod I, and ambulation with cga and no AD. Pt demonstrates deficits with endurance/strength/balance. Would benefit from skilled PT to address above deficits and promote optimal return to PLOF. Recommend OP PT for addressing higher level deficits.      Follow Up Recommendations Outpatient PT    Equipment Recommendations  None recommended by PT    Recommendations for Other Services       Precautions / Restrictions Precautions Precautions: Fall Restrictions Weight Bearing Restrictions: No      Mobility  Bed Mobility Overal bed mobility: Needs Assistance Bed Mobility: Supine to Sit     Supine to sit: Min guard     General bed mobility comments: needs cues and guidance for trunkal elevation. Once seated at EOB, able to sit with upright posture.  Transfers Overall transfer level: Modified independent Equipment used: None             General transfer comment: transfers performed with safe technique. UPright posture noted  Ambulation/Gait Ambulation/Gait assistance: Min guard Ambulation Distance (Feet): 40 Feet Assistive device: None Gait Pattern/deviations: Step-to pattern     General Gait Details: ambulated at slightly slower speed, however no AD required. Slightly unsteady although no formal LOB noted. Fatigues and requests to return back to bed  Stairs            Wheelchair Mobility    Modified Rankin (Stroke Patients Only)       Balance Overall balance assessment: Modified Independent                                           Pertinent Vitals/Pain Pain Assessment: No/denies pain    Home Living Family/patient expects to be discharged to:: Private residence Living Arrangements: Spouse/significant other Available Help at Discharge: Available 24 hours/day;Family Type of Home: House Home Access: Stairs to enter Entrance Stairs-Rails: Can reach both Entrance Stairs-Number of Steps: 6 Home Layout: One level Home Equipment: None      Prior Function Level of Independence: Independent         Comments: able to walk independently, 1 fall in past 6 months     Hand Dominance        Extremity/Trunk Assessment   Upper Extremity Assessment Upper Extremity Assessment: Overall WFL for tasks assessed    Lower Extremity Assessment Lower Extremity Assessment: Generalized weakness(B LE grossly 4+/5)       Communication   Communication: No difficulties  Cognition Arousal/Alertness: Awake/alert Behavior During Therapy: WFL for tasks assessed/performed Overall Cognitive Status: Within Functional Limits for tasks assessed                                        General Comments      Exercises     Assessment/Plan    PT Assessment Patient needs continued PT services  PT Problem List Decreased strength;Decreased activity tolerance;Decreased balance;Decreased mobility  PT Treatment Interventions Gait training;DME instruction;Therapeutic exercise;Balance training    PT Goals (Current goals can be found in the Care Plan section)  Acute Rehab PT Goals Patient Stated Goal: to get stronger PT Goal Formulation: With patient Time For Goal Achievement: 10/09/17 Potential to Achieve Goals: Good    Frequency Min 2X/week   Barriers to discharge        Co-evaluation               AM-PAC PT "6 Clicks" Daily Activity  Outcome Measure Difficulty turning over in bed (including adjusting bedclothes, sheets and blankets)?:  None Difficulty moving from lying on back to sitting on the side of the bed? : Unable Difficulty sitting down on and standing up from a chair with arms (e.g., wheelchair, bedside commode, etc,.)?: A Little Help needed moving to and from a bed to chair (including a wheelchair)?: A Little Help needed walking in hospital room?: A Little Help needed climbing 3-5 steps with a railing? : A Little 6 Click Score: 17    End of Session Equipment Utilized During Treatment: Gait belt Activity Tolerance: Patient tolerated treatment well Patient left: in bed;with call bell/phone within reach;with bed alarm set;with family/visitor present(refuses to sit in recliner at this time) Nurse Communication: Mobility status PT Visit Diagnosis: Unsteadiness on feet (R26.81);Muscle weakness (generalized) (M62.81);History of falling (Z91.81);Difficulty in walking, not elsewhere classified (R26.2)    Time: 2297-9892 PT Time Calculation (min) (ACUTE ONLY): 12 min   Charges:   PT Evaluation $PT Eval Low Complexity: 1 Low     PT G Codes:   PT G-Codes **NOT FOR INPATIENT CLASS** Functional Assessment Tool Used: AM-PAC 6 Clicks Basic Mobility Functional Limitation: Mobility: Walking and moving around Mobility: Walking and Moving Around Current Status (J1941): At least 40 percent but less than 60 percent impaired, limited or restricted Mobility: Walking and Moving Around Goal Status [phone]): At least 20 percent but less than 40 percent impaired, limited or restricted    Greggory Stallion, PT, DPT [phone]   Aimee Jones 09/25/2017, 5:01 PM

## 2017-09-25 NOTE — Progress Notes (Signed)
[facility] NAME: Aimee Jones    MR#:  [id number]  DATE OF BIRTH:  1946/07/03  SUBJECTIVE:  CHIEF COMPLAINT:   Chief Complaint  Patient presents with  . Weakness  . Coffee Ground Emesis   - Patient with dementia admitted with abdominal pain, nausea and vomiting. WBC is improving -No fevers today. Symptoms are slowly resolving. Husband at bedside as patient has dementia  REVIEW OF SYSTEMS:  Review of Systems  Constitutional: Negative for chills and fever.       Lack of appetite  HENT: Negative for congestion, ear discharge, hearing loss and nosebleeds.   Eyes: Negative for blurred vision and double vision.  Respiratory: Negative for cough, shortness of breath and wheezing.   Cardiovascular: Negative for chest pain, palpitations and leg swelling.  Gastrointestinal: Negative for abdominal pain, constipation, diarrhea, nausea and vomiting.  Genitourinary: Negative for dysuria.  Musculoskeletal: Negative for myalgias.  Neurological: Negative for dizziness, speech change, focal weakness, seizures and headaches.  Psychiatric/Behavioral: Negative for depression.    DRUG ALLERGIES:   Allergies  Allergen Reactions  . Tramadol Other (See Comments)    Would avoid.  History of seizure on medication  . Latex Rash  . Penicillins Rash    .Marland KitchenHas patient had a PCN reaction causing immediate rash, facial/tongue/throat swelling, SOB or lightheadedness with hypotension: No Has patient had a PCN reaction causing severe rash involving mucus membranes or skin necrosis: No Has patient had a PCN reaction that required hospitalization No Has patient had a PCN reaction occurring within the last 10 years: No If all of the above answers are "NO", then may proceed with Cephalosporin use.      VITALS:  Blood pressure (!) 106/52, pulse 87, temperature 98.4 F (36.9 C), temperature source Oral, resp. rate 12, height 5\' 6"  (1.676 m), weight  61.7 kg (136 lb 1.6 oz), SpO2 95 %.  PHYSICAL EXAMINATION:  Physical Exam  GENERAL:  71 y.o.-year-old patient lying in the bed with no acute distress.  EYES: Pupils equal, round, reactive to light and accommodation. No scleral icterus. Extraocular muscles intact.  HEENT: Head atraumatic, normocephalic. Oropharynx and nasopharynx clear.  NECK:  Supple, no jugular venous distention. No thyroid enlargement, no tenderness.  LUNGS: Normal breath sounds bilaterally, no wheezing, rales,rhonchi or crepitation. No use of accessory muscles of respiration.  CARDIOVASCULAR: S1, S2 normal. No  rubs, or gallops. 2/6 systolic murmur present ABDOMEN: Soft, nontender, nondistended. Bowel sounds present. No organomegaly or mass.  EXTREMITIES: No pedal edema, cyanosis, or clubbing.  NEUROLOGIC: Cranial nerves II through XII are intact. Muscle strength 5/5 in all extremities. Sensation intact. Gait not checked. Global weakness noted. PSYCHIATRIC: The patient is alert and oriented to self.  SKIN: No obvious rash, lesion, or ulcer.    LABORATORY PANEL:   CBC Recent Labs  Lab 09/25/17 0339  WBC 20.4*  HGB 9.0*  HCT 28.6*  PLT 763*   ------------------------------------------------------------------------------------------------------------------  Chemistries  Recent Labs  Lab 09/25/17 0339  NA 132*  K 3.4*  CL 106  CO2 21*  GLUCOSE 108*  BUN 25*  CREATININE 0.65  CALCIUM 8.1*  AST 22  ALT 8*  ALKPHOS 73  BILITOT 0.5   ------------------------------------------------------------------------------------------------------------------  Cardiac Enzymes Recent Labs  Lab 09/24/17 0758  TROPONINI <0.03   ------------------------------------------------------------------------------------------------------------------  RADIOLOGY:  Dg Abdomen 1 View  Result Date: 09/24/2017 CLINICAL DATA:  Episode of coughing ground emesis this morning. The patient is pale and is reporting  increasing  weakness over several months. EXAM: ABDOMEN - 1 VIEW COMPARISON:  Abdominal and pelvic CT scan of September 22nd 2016. FINDINGS: The bowel gas pattern is normal. There are no abnormal soft tissue calcifications. There are degenerative changes of the lower lumbar spine. There prosthetic hip joints bilaterally. IMPRESSION: No acute intra-abdominal abnormality is observed. Electronically Signed   By: David  Martinique M.D.   On: 09/24/2017 08:36   Ct Abdomen Pelvis W Contrast  Result Date: 09/24/2017 CLINICAL DATA:  Weakness and coffee ground emesis. EXAM: CT ABDOMEN AND PELVIS WITH CONTRAST TECHNIQUE: Multidetector CT imaging of the abdomen and pelvis was performed using the standard protocol following bolus administration of intravenous contrast. CONTRAST:  127mL ISOVUE-300 IOPAMIDOL (ISOVUE-300) INJECTION 61% COMPARISON:  07/08/2015 FINDINGS: Lower chest: No acute abnormality. Hepatobiliary: Stable scattered cysts in both lobes of the liver. The gallbladder is distended but not obviously inflamed by CT. There is evidence of extrahepatic biliary ductal dilatation with the common bile duct measuring approximately 9-10 mm. There appears to be some debris in the distal aspect of the common bile duct and potentially calculus or calculi within the terminal CBD near the ampulla. No obvious obstructing mass is identified at the level of the ampulla or pancreatic head. Pancreas: Unremarkable. No pancreatic ductal dilatation or surrounding inflammatory changes. Spleen: The pancreatic duct is mildly dilated in the head of the pancreas measuring 3 mm. The pancreas itself is mildly atrophic. No obvious pancreatic mass identified. Adrenals/Urinary Tract: Adrenal glands are unremarkable. Kidneys are normal, without renal calculi, focal lesion, or hydronephrosis. Bladder is unremarkable. Stomach/Bowel: There is diffuse dilatation of the ascending, transverse and proximal descending colon which are filled with fluid. Findings may  be consistent with enteritis/relative ileus of the colon. There is no visible obvious obstructing mass. Stool is present in the distal portion of the colon. The small bowel is nondilated. No free air identified. Vascular/Lymphatic: Atherosclerosis of the abdominal aorta present without evidence of aneurysm. No enlarged lymph nodes are identified in the abdomen or pelvis. Reproductive: Status post hysterectomy. No adnexal masses. Other: No abdominal wall hernia or abnormality. No abdominopelvic ascites. Musculoskeletal: Mild degenerative disc disease of the lumbar spine present. IMPRESSION: 1. Dilated common bile duct measuring approximately 9-10 mm. Probable debris/calculi within the common duct including potentially an obstructing calculus/calculi in the distal duct. Consider further evaluation with MRCP versus ERCP. There is associated distention of the gallbladder without overt gallbladder inflammation by CT. 2. Dilatation of the colon containing fluid. This may be consistent with enteritis/ileus. 3. Stable hepatic cysts. Electronically Signed   By: Aletta Edouard M.D.   On: 09/24/2017 09:42   Dg Chest Portable 1 View  Result Date: 09/24/2017 CLINICAL DATA:  Increasing weakness for the past few months. Coffee-ground emesis on the way to the hospital. History of previous CVA, hypertension, and dementia. EXAM: PORTABLE CHEST 1 VIEW COMPARISON:  PA and lateral chest x-ray of November 03, 2015 FINDINGS: The lungs are adequately inflated. The heart and pulmonary vascularity are normal. The mediastinum is normal in width. There is no pleural effusion. IMPRESSION: There is no acute cardiopulmonary abnormality. Thoracic aortic atherosclerosis. Electronically Signed   By: David  Martinique M.D.   On: 09/24/2017 08:35    EKG:   Orders placed or performed during the hospital encounter of 09/24/17  . EKG 12-Lead  . EKG 12-Lead    ASSESSMENT AND PLAN:   71 year old female with past medical history significant for  dementia, hypertension, GERD, hyponatremia, chronic pain syndrome, osteoporosis, arthritis  presents to hospital secondary to abdominal pain, nausea and vomiting.  1. Possible acute cholangitis-came in with abdominal pain and nausea vomiting. WBC significantly elevated -Continue Cipro and Flagyl IV. WBC is improving -Patient remains afebrile. LFTs are within normal limits. -CT of the abdomen showing dilated CBD of up to 10 mm and possible calculi. Since Plavix was only 2 days ago, currently on hold if she needs ERCP. -GI consulted. -On a full liquid diet now.  2. Possible enteritis-on Cipro and Flagyl with improving symptoms. Diet being advanced  3. Hematemesis-secondary to repeated vomiting. No further episodes. Monitor hemoglobin. Change Protonix to IV twice a day. DC Lovenox  4. Dementia-seems to be at baseline. Continue Aricept, Celexa and doxepin  5. Thrombocytosis-follows with oncology as outpatient.  6. Hypokalemia-will be replaced  7. Chronic pain syndrome-continue home medications. On fentanyl patch and Percocet.  Physical therapy consulted. Patient ambulatory at baseline    All the records are reviewed and case discussed with Care Management/Social Workerr. Management plans discussed with the patient, family and they are in agreement.  CODE STATUS: Full code  TOTAL TIME TAKING CARE OF THIS PATIENT: 38 minutes.   POSSIBLE D/C IN 2 DAYS, DEPENDING ON CLINICAL CONDITION.   Gladstone Lighter M.D on 09/25/2017 at 2:29 PM  Between 7am to 6pm - Pager - [phone]  After 6pm go to [email or web address] - password [email or web address] [facility]  Office  [phone]  CC: Primary care physician; Tonia Ghent, MD

## 2017-09-25 NOTE — Consult Note (Signed)
[facility]  [phone] [phone]  ID: Aimee Jones OB: 1946/03/13  MR#: [id number]  NGE#:[id number]  Patient Care Team: Tonia Ghent, MD as PCP - General (Family Medicine) Justice Britain, MD as Consulting Physician (Orthopedic Surgery)  CHIEF COMPLAINT: Iron deficiency anemia, thrombocytosis, abdominal pain.  INTERVAL HISTORY: Patient is a 71 year old female with Alzheimer's dementia who was recently seen at the cancer center on August 23, 2017.  At that point, her hemoglobin iron stores are within normal limits.  She had a persistent, but stable thrombocytosis as well.  She presented to the emergency room with increasing abdominal pain and "collapsing" at home.  CT scan revealed possible biliary obstruction.  There was also a concern for coffee-ground emesis.  The entire history is given by her husband.  She offers no further specific complaints today.  REVIEW OF SYSTEMS:   Review of Systems  Constitutional: Negative.  Negative for fever, malaise/fatigue and weight loss.  Respiratory: Negative.  Negative for cough, sputum production and shortness of breath.   Cardiovascular: Negative.  Negative for chest pain and leg swelling.  Gastrointestinal: Positive for abdominal pain and vomiting. Negative for blood in stool and melena.  Genitourinary: Negative.  Negative for hematuria.  Musculoskeletal: Negative.   Skin: Negative.  Negative for rash.  Neurological: Negative.  Negative for weakness.  Psychiatric/Behavioral: Positive for memory loss.    As per HPI. Otherwise, a complete review of systems is negative.  PAST MEDICAL HISTORY: Past Medical History:  Diagnosis Date  . Allergy   . Anxiety    panic attacks- on occas.   . Arthritis    osteoporosis - especially hips.   . Asthma   . Confusion   . Dementia   . Depression   . Diverticulosis   . ETOH abuse   . Falls   . GERD (gastroesophageal reflux disease)   . Hyperlipidemia     . Hypertension   . Hyponatremia 07/05/2015  . Memory loss   . Normal 24 hour ambulatory pH monitoring study    good acid suppression  . Observed seizure-like activity ([place]) 07/05/2015   observed by husband , Dr Doy Mince  . Seizures ([place])   . Shortness of breath   . Stroke [facility])     PAST SURGICAL HISTORY: Past Surgical History:  Procedure Laterality Date  . ABDOMINAL HYSTERECTOMY    . BREAST SURGERY     3x- biposies   . COLONOSCOPY WITH PROPOFOL N/A 08/05/2015   Procedure: COLONOSCOPY WITH PROPOFOL;  Surgeon: [name], MD;  Location: [facility] ENDOSCOPY;  Service: Endoscopy;  Laterality: N/A;  . DOBUTAMINE STRESS ECHO  07/09/2001   normal  . ESOPHAGOGASTRODUODENOSCOPY  06/11/2006   normal  . ESOPHAGOGASTRODUODENOSCOPY (EGD) WITH PROPOFOL N/A 08/05/2015   Procedure: ESOPHAGOGASTRODUODENOSCOPY (EGD) WITH PROPOFOL;  Surgeon: [name], MD;  Location: [facility] ENDOSCOPY;  Service: Endoscopy;  Laterality: N/A;  . EYE SURGERY     cataracts removed, IOL- both eyes   . JOINT REPLACEMENT     bilateral hips  . LOOP RECORDER IMPLANT N/A 10/14/2014   Procedure: LOOP RECORDER IMPLANT;  Surgeon: Evans Lance, MD;  [facility] LAB;  Service: Cardiovascular;  Laterality: N/A;  . NASAL SINUS SURGERY  1990  . REVERSE SHOULDER ARTHROPLASTY Right 08/13/2014   Procedure: REVERSE SHOULDER ARTHROPLASTY;  Surgeon: Marin Shutter, MD;  Location: [facility];  Service: Orthopedics;  Laterality: Right;  interscalene block  . SHOULDER ARTHROSCOPY Left   . TEE WITHOUT CARDIOVERSION N/A  10/14/2014   Procedure: TRANSESOPHAGEAL ECHOCARDIOGRAM (TEE);  Surgeon: Sueanne Margarita, MD;  Location: [facility];  Service: Cardiovascular;  Laterality: N/A;  . TOTAL HIP ARTHROPLASTY     left 1991/right 2008  . ULNAR NERVE TRANSPOSITION Right 08/01/2016   Procedure: RIGHT ULNAR NEUROPLASTY AT THE ELBOW;  Surgeon: Milly Jakob, MD;  Location: [place] [facility];  Service: Orthopedics;  Laterality: Right;     FAMILY HISTORY: Family History  Problem Relation Age of Onset  . Heart block Mother   . Stroke Mother   . Heart attack Father   . Sudden death Brother   . Heart attack Brother   . Hypertension Brother   . Esophageal cancer Brother   . Stroke Brother     ADVANCED DIRECTIVES (Y/N):  @ADVDIR @  HEALTH MAINTENANCE: Social History   Tobacco Use  . Smoking status: Never Smoker  . Smokeless tobacco: Never Used  Substance Use Topics  . Alcohol use: No    Alcohol/week: 0.0 oz  . Drug use: No     Colonoscopy:  PAP:  Bone density:  Lipid panel:  Allergies  Allergen Reactions  . Tramadol Other (See Comments)    Would avoid.  History of seizure on medication  . Latex Rash  . Penicillins Rash    .Marland KitchenHas patient had a PCN reaction causing immediate rash, facial/tongue/throat swelling, SOB or lightheadedness with hypotension: No Has patient had a PCN reaction causing severe rash involving mucus membranes or skin necrosis: No Has patient had a PCN reaction that required hospitalization No Has patient had a PCN reaction occurring within the last 10 years: No If all of the above answers are "NO", then may proceed with Cephalosporin use.      Current Facility-Administered Medications  Medication Dose Route Frequency Provider Last Rate Last Dose  . 0.9 %  sodium chloride infusion   Intravenous Continuous Epifanio Lesches, MD 100 mL/hr at 09/25/17 1901    . acetaminophen (TYLENOL) tablet 650 mg  650 mg Oral Q6H PRN Epifanio Lesches, MD   650 mg at 09/24/17 2055   Or  . acetaminophen (TYLENOL) suppository 650 mg  650 mg Rectal Q6H PRN Epifanio Lesches, MD      . amLODipine (NORVASC) tablet 2.5 mg  2.5 mg Oral Daily Epifanio Lesches, MD   2.5 mg at 09/25/17 1035  . atorvastatin (LIPITOR) tablet 10 mg  10 mg Oral Daily Epifanio Lesches, MD   10 mg at 09/25/17 1035  . ciprofloxacin (CIPRO) IVPB 400 mg  400 mg Intravenous Q12H Epifanio Lesches, MD 200 mL/hr at  09/25/17 2125 400 mg at 09/25/17 2125  . citalopram (CELEXA) tablet 20 mg  20 mg Oral Daily Epifanio Lesches, MD   20 mg at 09/25/17 1035  . donepezil (ARICEPT) tablet 10 mg  10 mg Oral QHS Epifanio Lesches, MD   10 mg at 09/25/17 2126  . doxepin (SINEQUAN) capsule 25 mg  25 mg Oral QHS Epifanio Lesches, MD   25 mg at 09/25/17 2126  . fentaNYL (DURAGESIC - dosed mcg/hr) patch 25 mcg  25 mcg Transdermal Q72H Fritzi Mandes, MD   25 mcg at 09/24/17 1704  . MEDLINE mouth rinse  15 mL Mouth Rinse q12n4p Epifanio Lesches, MD   15 mL at 09/25/17 1514  . metroNIDAZOLE (FLAGYL) IVPB 500 mg  500 mg Intravenous Q8H Epifanio Lesches, MD   Stopped at 09/25/17 1707  . ondansetron (ZOFRAN) tablet 4 mg  4 mg Oral Q6H PRN Epifanio Lesches, MD  Or  . ondansetron (ZOFRAN) injection 4 mg  4 mg Intravenous Q6H PRN Epifanio Lesches, MD      . oxyCODONE-acetaminophen (PERCOCET) 7.5-325 MG per tablet 1 tablet  1 tablet Oral Q8H PRN Fritzi Mandes, MD   1 tablet at 09/25/17 1511  . pantoprazole (PROTONIX) injection 40 mg  40 mg Intravenous Q12H Gladstone Lighter, MD   40 mg at 09/25/17 2125  . sodium chloride 0.9 % bolus 500 mL  500 mL Intravenous Once Delman Kitten, MD      . traZODone (DESYREL) tablet 25 mg  25 mg Oral QHS PRN Epifanio Lesches, MD   25 mg at 09/25/17 2129    OBJECTIVE: Vitals:   09/25/17 1356 09/25/17 1955  BP: (!) 106/52 120/68  Pulse: 87 92  Resp: 12 18  Temp: 98.4 F (36.9 C) (!) 97.3 F (36.3 C)  SpO2: 95% 93%     Body mass index is 21.97 kg/m.    ECOG FS:2 - Symptomatic, <50% confined to bed  General: Ill-appearing, no acute distress. Eyes: Pink conjunctiva, anicteric sclera. HEENT: Normocephalic, moist mucous membranes, clear oropharnyx. Lungs: Clear to auscultation bilaterally. Heart: Regular rate and rhythm. No rubs, murmurs, or gallops. Abdomen: Soft, nontender, nondistended. No organomegaly noted, normoactive bowel sounds. Musculoskeletal: No  edema, cyanosis, or clubbing. Neuro: Confused, but alert.  Cranial nerves grossly intact. Skin: No rashes or petechiae noted. Psych: Normal affect.   LAB RESULTS:  Lab Results  Component Value Date   NA 132 (L) 09/25/2017   K 3.4 (L) 09/25/2017   CL 106 09/25/2017   CO2 21 (L) 09/25/2017   GLUCOSE 108 (H) 09/25/2017   BUN 25 (H) 09/25/2017   CREATININE 0.65 09/25/2017   CALCIUM 8.1 (L) 09/25/2017   PROT 4.5 (L) 09/25/2017   ALBUMIN 2.3 (L) 09/25/2017   AST 22 09/25/2017   ALT 8 (L) 09/25/2017   ALKPHOS 73 09/25/2017   BILITOT 0.5 09/25/2017   GFRNONAA >60 09/25/2017   GFRAA >60 09/25/2017    Lab Results  Component Value Date   WBC 20.4 (H) 09/25/2017   NEUTROABS 5.7 08/23/2017   HGB 9.0 (L) 09/25/2017   HCT 28.6 (L) 09/25/2017   MCV 77.8 (L) 09/25/2017   PLT 763 (H) 09/25/2017     STUDIES: Dg Abdomen 1 View  Result Date: 09/24/2017 CLINICAL DATA:  Episode of coughing ground emesis this morning. The patient is pale and is reporting increasing weakness over several months. EXAM: ABDOMEN - 1 VIEW COMPARISON:  Abdominal and pelvic CT scan of September 22nd 2016. FINDINGS: The bowel gas pattern is normal. There are no abnormal soft tissue calcifications. There are degenerative changes of the lower lumbar spine. There prosthetic hip joints bilaterally. IMPRESSION: No acute intra-abdominal abnormality is observed. Electronically Signed   By: David  Martinique M.D.   On: 09/24/2017 08:36   Ct Abdomen Pelvis W Contrast  Result Date: 09/24/2017 CLINICAL DATA:  Weakness and coffee ground emesis. EXAM: CT ABDOMEN AND PELVIS WITH CONTRAST TECHNIQUE: Multidetector CT imaging of the abdomen and pelvis was performed using the standard protocol following bolus administration of intravenous contrast. CONTRAST:  116mL ISOVUE-300 IOPAMIDOL (ISOVUE-300) INJECTION 61% COMPARISON:  07/08/2015 FINDINGS: Lower chest: No acute abnormality. Hepatobiliary: Stable scattered cysts in both lobes of the  liver. The gallbladder is distended but not obviously inflamed by CT. There is evidence of extrahepatic biliary ductal dilatation with the common bile duct measuring approximately 9-10 mm. There appears to be some debris in the distal aspect of the common  bile duct and potentially calculus or calculi within the terminal CBD near the ampulla. No obvious obstructing mass is identified at the level of the ampulla or pancreatic head. Pancreas: Unremarkable. No pancreatic ductal dilatation or surrounding inflammatory changes. Spleen: The pancreatic duct is mildly dilated in the head of the pancreas measuring 3 mm. The pancreas itself is mildly atrophic. No obvious pancreatic mass identified. Adrenals/Urinary Tract: Adrenal glands are unremarkable. Kidneys are normal, without renal calculi, focal lesion, or hydronephrosis. Bladder is unremarkable. Stomach/Bowel: There is diffuse dilatation of the ascending, transverse and proximal descending colon which are filled with fluid. Findings may be consistent with enteritis/relative ileus of the colon. There is no visible obvious obstructing mass. Stool is present in the distal portion of the colon. The small bowel is nondilated. No free air identified. Vascular/Lymphatic: Atherosclerosis of the abdominal aorta present without evidence of aneurysm. No enlarged lymph nodes are identified in the abdomen or pelvis. Reproductive: Status post hysterectomy. No adnexal masses. Other: No abdominal wall hernia or abnormality. No abdominopelvic ascites. Musculoskeletal: Mild degenerative disc disease of the lumbar spine present. IMPRESSION: 1. Dilated common bile duct measuring approximately 9-10 mm. Probable debris/calculi within the common duct including potentially an obstructing calculus/calculi in the distal duct. Consider further evaluation with MRCP versus ERCP. There is associated distention of the gallbladder without overt gallbladder inflammation by CT. 2. Dilatation of the  colon containing fluid. This may be consistent with enteritis/ileus. 3. Stable hepatic cysts. Electronically Signed   By: Aletta Edouard M.D.   On: 09/24/2017 09:42   Dg Chest Portable 1 View  Result Date: 09/24/2017 CLINICAL DATA:  Increasing weakness for the past few months. Coffee-ground emesis on the way to the hospital. History of previous CVA, hypertension, and dementia. EXAM: PORTABLE CHEST 1 VIEW COMPARISON:  PA and lateral chest x-ray of November 03, 2015 FINDINGS: The lungs are adequately inflated. The heart and pulmonary vascularity are normal. The mediastinum is normal in width. There is no pleural effusion. IMPRESSION: There is no acute cardiopulmonary abnormality. Thoracic aortic atherosclerosis. Electronically Signed   By: David  Martinique M.D.   On: 09/24/2017 08:35    ASSESSMENT: Iron deficiency anemia, thrombocytosis, abdominal pain.  PLAN:    1.  Iron deficiency anemia: Possibly secondary to acute blood loss.  She had a normal hemoglobin 1 month ago.  Proceed with 510 mg IV Feraheme today.  Will consider a second dose later in the week or early next week.  Does not require blood transfusion at this time, but would consider one if her hemoglobin continues to trend down approach to 7.0.  Continue to follow daily CBC. 2.  Thrombocytosis: Chronic.  Acute elevation likely reactive secondary to abdominal pain and vomiting.   3.  Possible coffee-ground emesis: Appreciate GI input.  Possible ERCP in the future.  Appreciate consult, will follow.  Lloyd Huger, MD   09/25/2017 10:00 PM

## 2017-09-25 NOTE — Consult Note (Addendum)
GI Inpatient Consult Note Aimee Jones, M.D.  Reason for Consult:  Choledocholithiasis, abdominal pain, nausea, vomiting, leukocytosis   Attending Requesting Consult: Dr. Tressia Miners  History of Present Illness: Aimee Jones is a 71 y.o. female with a hx of Alzheimer's Dementia presenting for abdominal pain for the past several days culminating with fall at home and vomiting. Patient is alert, but unable to give a comprehensive history. Patient husband is at bedside and gives the history for the patient. Apparently the patient has had decreased oral intake for the past few months and has lost about 15 pounds over this time. Patient over the past 2 days to her husband lower quadrant abdominal pain. Her appetite waxes and wanes with eating 100% remaining day unless on fluid the next day, per the husband. The patient was brought to the hospital yesterday after having seizure-like activity at home and falling from her bed. The patient has not had any hematemesis or hematochezia per the husband's history. There was some bilious vomiting that occurred in the ambulance which apparently did not recur after hospital admission. Patient has had leukocytosis of 26,000 now down to 20,000. The patient is afebrile liver enzymes have remained normal. Abdominal CT scan did show, however, dilated common bile duct to around 9-10 mm. There's. The some debris consistent with possible small stones in the distal common bile duct without any obvious mass at the head of the pancreas or inside the bile duct. Patient was admitted and promptly started on IV antibiotics (ciprofloxacin and Flagyl) and appears to be doing well thus far. She does take Plavix for history of cerebrovascular disease with stroke and has been off this for approximately less than 24 hours. The patient appears to have poor appetite today and occasionally liquids although I don't believe the patient is been placed on regular diet yet. Patient has  been admitted use given the patient more than just occasional aspirin for chronic shoulder pain and headaches. Patient is not responding completely current pain medication At Home.  Past EGD and colonoscopy in 2016 Revealed Normal EGD and on colon -minimal polyps in the colon and random colon bx was negative for colitis/recurrent C Dif (which the patient has contracted in the past).  Past Medical History:  Past Medical History:  Diagnosis Date  . Allergy   . Anxiety    panic attacks- on occas.   . Arthritis    osteoporosis - especially hips.   . Asthma   . Confusion   . Dementia   . Depression   . Diverticulosis   . ETOH abuse   . Falls   . GERD (gastroesophageal reflux disease)   . Hyperlipidemia   . Hypertension   . Hyponatremia 07/05/2015  . Memory loss   . Normal 24 hour ambulatory pH monitoring study    good acid suppression  . Observed seizure-like activity ([place]) 07/05/2015   observed by husband , Dr Doy Mince  . Seizures ([place])   . Shortness of breath   . Stroke [facility])     Problem List: Patient Active Problem List   Diagnosis Date Noted  . Pressure injury of skin 09/25/2017  . Abdominal pain 09/24/2017  . Healthcare maintenance 08/07/2017  . Headache 11/14/2016  . Hearing loss 05/24/2016  . Enteritis due to Clostridium difficile 05/24/2016  . Leukocytosis 05/12/2016  . Thrombocytosis ([place]) 05/12/2016  . Cervical stenosis of spine 01/10/2016  . Weight loss 12/06/2015  . Dysuria 10/14/2015  . Cerebrovascular accident (CVA) due to bilateral embolism  of posterior cerebral arteries ([place]) 09/27/2015  . Chronic pain syndrome 09/27/2015  . Elevated blood pressure 07/07/2015  . Seizure-like activity ([place]) 07/04/2015  . Seizure ([place]) 07/04/2015  . Depression   . GI bleed 06/24/2015  . Frequent falls   . Narcotic abuse ([place])   . Benzodiazepine abuse ([place])   . Alcohol use with alcohol-induced persisting dementia ([place])   . Generalized anxiety disorder   . Hypokalemia    . Hypomagnesemia   . Anemia, iron deficiency   . Urinary retention   . Altered mental status 06/22/2015  . Fall 06/22/2015  . Iron deficiency anemia 06/03/2015  . Medicare annual wellness visit, subsequent 04/23/2015  . Advance care planning 04/23/2015  . Senile purpura ([place]) 04/23/2015  . Abnormal CBC 04/23/2015  . Sleep apnea 12/11/2014  . Dementia associated with alcoholism ([place]) 12/11/2014  . Stroke ([place])   . Cerebral thrombosis with cerebral infarction ([place]) 10/13/2014  . HLD (hyperlipidemia)   . Facial droop 10/12/2014  . CKD (chronic kidney disease), stage III ([place]) 10/12/2014  . S/P shoulder replacement 08/13/2014  . Postmenopausal HRT (hormone replacement therapy) 05/31/2014  . Hyponatremia 05/28/2014  . Shoulder pain, bilateral 10/25/2012  . Parkinsonism ([place]) 08/22/2012  . MEMORY LOSS 06/22/2010  . ALCOHOL ABUSE 03/08/2010  . 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 OBSTRUCTIVE CHRONIC BRONCHITIS WITH EXACERBATION 01/02/2008  . GLUCOSE INTOLERANCE 11/15/2007  . GERD 07/25/2007  . OSTEOARTHRITIS 07/25/2007  . PANIC ATTACK 05/01/2007  . Essential hypertension 05/01/2007  . ALLERGIC RHINITIS 05/01/2007  . ASTHMA 05/01/2007  . DIVERTICULOSIS, COLON 05/01/2007  . MENOPAUSAL DISORDER 05/01/2007  . FIBROMYALGIA 05/01/2007    Past Surgical History: Past Surgical History:  Procedure Laterality Date  . ABDOMINAL HYSTERECTOMY    . BREAST SURGERY     3x- biposies   . COLONOSCOPY WITH PROPOFOL N/A 08/05/2015   Procedure: COLONOSCOPY WITH PROPOFOL;  Surgeon: [name], MD;  Location: [facility]COPY;  Service: Endoscopy;  Laterality: N/A;  . DOBUTAMINE STRESS ECHO  07/09/2001   normal  . ESOPHAGOGASTRODUODENOSCOPY  06/11/2006   normal  . ESOPHAGOGASTRODUODENOSCOPY (EGD) WITH PROPOFOL N/A 08/05/2015   Procedure: ESOPHAGOGASTRODUODENOSCOPY (EGD) WITH PROPOFOL;  Surgeon: [name], MD;  Location: [facility];  Service: Endoscopy;  Laterality: N/A;  . EYE SURGERY      cataracts removed, IOL- both eyes   . JOINT REPLACEMENT     bilateral hips  . LOOP RECORDER IMPLANT N/A 10/14/2014   Procedure: LOOP RECORDER IMPLANT;  Surgeon: Evans Lance, MD;  Location: [facility] CATH LAB;  Service: Cardiovascular;  Laterality: N/A;  . NASAL SINUS SURGERY  1990  . REVERSE SHOULDER ARTHROPLASTY Right 08/13/2014   Procedure: REVERSE SHOULDER ARTHROPLASTY;  Surgeon: Marin Shutter, MD;  Location: [place];  Service: Orthopedics;  Laterality: Right;  interscalene block  . SHOULDER ARTHROSCOPY Left   . TEE WITHOUT CARDIOVERSION N/A 10/14/2014   Procedure: TRANSESOPHAGEAL ECHOCARDIOGRAM (TEE);  Surgeon: Sueanne Margarita, MD;  Location: [place];  Service: Cardiovascular;  Laterality: N/A;  . TOTAL HIP ARTHROPLASTY     left 1991/right 2008  . ULNAR NERVE TRANSPOSITION Right 08/01/2016   Procedure: RIGHT ULNAR NEUROPLASTY AT THE ELBOW;  Surgeon: Milly Jakob, MD;  Location: [place];  Service: Orthopedics;  Laterality: Right;    Allergies: Allergies  Allergen Reactions  . Tramadol Other (See Comments)    Would avoid.  History of seizure on medication  . Latex Rash  . Penicillins Rash    .Marland KitchenHas patient had a PCN  reaction causing immediate rash, facial/tongue/throat swelling, SOB or lightheadedness with hypotension: No Has patient had a PCN reaction causing severe rash involving mucus membranes or skin necrosis: No Has patient had a PCN reaction that required hospitalization No Has patient had a PCN reaction occurring within the last 10 years: No If all of the above answers are "NO", then may proceed with Cephalosporin use.      Home Medications: Medications Prior to Admission  Medication Sig Dispense Refill Last Dose  . acetaminophen (TYLENOL) 325 MG tablet Take 2 tablets (650 mg total) by mouth 3 (three) times daily as needed.   09/23/2017 at prn  . amLODipine (NORVASC) 2.5 MG tablet TAKE 1 TABLET BY MOUTH EVERY DAY 30 tablet 5 09/23/2017 at Unknown time   . atorvastatin (LIPITOR) 10 MG tablet TAKE 1 TABLET BY MOUTH EVERY DAY 90 tablet 0 09/23/2017 at Unknown time  . citalopram (CELEXA) 20 MG tablet TAKE 1 TABLET BY MOUTH EVERY DAY 90 tablet 0 09/23/2017 at Unknown time  . clopidogrel (PLAVIX) 75 MG tablet TAKE 1 TABLET (75 MG TOTAL) BY MOUTH DAILY.   09/23/2017 at Unknown time  . donepezil (ARICEPT) 10 MG tablet TAKE 1 TABLET BY MOUTH DAILY AT BEDTIME 90 tablet 0 09/23/2017 at Unknown time  . doxepin (SINEQUAN) 25 MG capsule TAKE 1 CAPSULE BY MOUTH DAILY AT BEDTIME 90 capsule 1 09/23/2017 at Unknown time  . ibuprofen (MOTRIN IB) 200 MG tablet Take 2 tablets (400 mg total) by mouth 3 (three) times daily as needed (with food).   09/23/2017 at Unknown time  . Multiple Vitamins-Minerals (EYE VITAMINS) CAPS Take 1 capsule by mouth daily.   09/23/2017 at Unknown time  . Omega-3 Fatty Acids (FISH OIL PO) Take by mouth.   09/23/2017 at Unknown time  . oxyCODONE-acetaminophen (PERCOCET) 7.5-325 MG tablet Take 1 tablet by mouth 3 (three) times daily.   0 09/23/2017 at Unknown time  . pantoprazole (PROTONIX) 20 MG tablet TAKE 1 TABLET BY MOUTH EVERY DAY 90 tablet 0 09/23/2017 at Unknown time  . vitamin E 400 UNIT capsule Take by mouth.   09/23/2017 at Unknown time  . Vitamins/Minerals TABS Take by mouth.   09/23/2017 at Unknown time  . fentaNYL (DURAGESIC - DOSED MCG/HR) 25 MCG/HR patch Place 25 mcg onto the skin every 3 (three) days.   09/21/2017  . fluticasone (FLONASE) 50 MCG/ACT nasal spray Place into the nose.   prn at prn  . hydrocortisone 2.5 % cream APPLY RECTALLY 2 TIMES DAILY 28.35 g 0 prn at prn  . ondansetron (ZOFRAN) 8 MG tablet Take 8 mg by mouth every 8 (eight) hours as needed.    prn at prn  . polyvinyl alcohol (LIQUIFILM TEARS) 1.4 % ophthalmic solution Place 1 drop into both eyes as needed.    prn at prn  . SYMBICORT 160-4.5 MCG/ACT inhaler INHALE 2 PUFFS INTO THE LUNGS 2 (TWO) TIMES DAILY AS NEEDED (SHORTNESS OF BREATH). 10.2 Inhaler 7 prn at prn    Home medication reconciliation was completed with the patient.   Scheduled Inpatient Medications:   . amLODipine  2.5 mg Oral Daily  . atorvastatin  10 mg Oral Daily  . citalopram  20 mg Oral Daily  . donepezil  10 mg Oral QHS  . doxepin  25 mg Oral QHS  . fentaNYL  25 mcg Transdermal Q72H  . mouth rinse  15 mL Mouth Rinse q12n4p  . pantoprazole (PROTONIX) IV  40 mg Intravenous Q12H  . potassium chloride  40 mEq Oral Once    Continuous Inpatient Infusions:   . sodium chloride 100 mL/hr at 09/25/17 0730  . ciprofloxacin Stopped (09/25/17 1156)  . metronidazole Stopped (09/25/17 0930)  . sodium chloride      PRN Inpatient Medications:  acetaminophen **OR** acetaminophen, ondansetron **OR** ondansetron (ZOFRAN) IV, oxyCODONE-acetaminophen, traZODone  Family History: family history includes Esophageal cancer in her brother; Heart attack in her brother and father; Heart block in her mother; Hypertension in her brother; Stroke in her brother and mother; Sudden death in her brother.   GI Family History:  Negative   Social History:   reports that  has never smoked. she has never used smokeless tobacco. She reports that she does not drink alcohol or use drugs. The patient denies ETOH, tobacco, or drug use.    Review of Systems: Review of Systems - History obtained from spouse. ROS negative except that in HPI. Patient has chronic joint/back pains.   Physical Examination: BP (!) 106/52 (BP Location: Left Arm)   Pulse 87   Temp 98.4 F (36.9 C) (Oral)   Resp 12   Ht 5\' 6"  (1.676 m)   Wt 61.7 kg (136 lb 1.6 oz)   SpO2 95%   BMI 21.97 kg/m  Physical Exam  Constitutional: She appears chronically ill.  HENT:  Nose: Nose normal.  Mouth/Throat: Oropharynx is clear.  Neck: No JVD present. No neck adenopathy.  Pulmonary/Chest: Breath sounds normal. She has no rales. She exhibits no tenderness.  Abdominal: Soft. She exhibits no distension and no mass. There is no hepatomegaly.  There is tenderness.  No rebound. Tenderness mainly in LLQ.   Musculoskeletal: She exhibits no edema or tenderness.  Neurological:  Somnolent, follows simple commands. Disoriented to time and place.   Skin: Skin is warm and dry.   Physical Exam  Constitutional: She appears chronically ill.  HENT:  Nose: Nose normal.  Mouth/Throat: Oropharynx is clear.  Neck: No JVD present. No neck adenopathy.  Pulmonary/Chest: Breath sounds normal. She has no rales. She exhibits no tenderness.  Abdominal: Soft. She exhibits no distension and no mass. There is no hepatomegaly. There is tenderness.  No rebound. Tenderness mainly in LLQ.   Musculoskeletal: She exhibits no edema or tenderness.  Neurological:  Somnolent, follows simple commands. Disoriented to time and place.   Skin: Skin is warm and dry.   Data: Lab Results  Component Value Date   WBC 20.4 (H) 09/25/2017   HGB 9.0 (L) 09/25/2017   HCT 28.6 (L) 09/25/2017   MCV 77.8 (L) 09/25/2017   PLT 763 (H) 09/25/2017   Recent Labs  Lab 09/24/17 0758 09/24/17 1043 09/25/17 0339  HGB 10.9* 10.1* 9.0*   Lab Results  Component Value Date   NA 132 (L) 09/25/2017   K 3.4 (L) 09/25/2017   CL 106 09/25/2017   CO2 21 (L) 09/25/2017   BUN 25 (H) 09/25/2017   CREATININE 0.65 09/25/2017   Lab Results  Component Value Date   ALT 8 (L) 09/25/2017   AST 22 09/25/2017   ALKPHOS 73 09/25/2017   BILITOT 0.5 09/25/2017   No results for input(s): APTT, INR, PTT in the last 168 hours. CBC Latest Ref Rng & Units 09/25/2017 09/24/2017 09/24/2017  WBC 3.6 - 11.0 K/uL 20.4(H) - 26.2(H)  Hemoglobin 12.0 - 16.0 g/dL 9.0(L) 10.1(L) 10.9(L)  Hematocrit 35.0 - 47.0 % 28.6(L) 31.7(L) 34.3(L)  Platelets 150 - 440 K/uL 763(H) - 889(H)    STUDIES: Dg Abdomen 1 View  Result  Date: 09/24/2017 CLINICAL DATA:  Episode of coughing ground emesis this morning. The patient is pale and is reporting increasing weakness over several months. EXAM: ABDOMEN - 1 VIEW  COMPARISON:  Abdominal and pelvic CT scan of September 22nd 2016. FINDINGS: The bowel gas pattern is normal. There are no abnormal soft tissue calcifications. There are degenerative changes of the lower lumbar spine. There prosthetic hip joints bilaterally. IMPRESSION: No acute intra-abdominal abnormality is observed. Electronically Signed   By: David  Martinique M.D.   On: 09/24/2017 08:36   Ct Abdomen Pelvis W Contrast  Result Date: 09/24/2017 CLINICAL DATA:  Weakness and coffee ground emesis. EXAM: CT ABDOMEN AND PELVIS WITH CONTRAST TECHNIQUE: Multidetector CT imaging of the abdomen and pelvis was performed using the standard protocol following bolus administration of intravenous contrast. CONTRAST:  110mL ISOVUE-300 IOPAMIDOL (ISOVUE-300) INJECTION 61% COMPARISON:  07/08/2015 FINDINGS: Lower chest: No acute abnormality. Hepatobiliary: Stable scattered cysts in both lobes of the liver. The gallbladder is distended but not obviously inflamed by CT. There is evidence of extrahepatic biliary ductal dilatation with the common bile duct measuring approximately 9-10 mm. There appears to be some debris in the distal aspect of the common bile duct and potentially calculus or calculi within the terminal CBD near the ampulla. No obvious obstructing mass is identified at the level of the ampulla or pancreatic head. Pancreas: Unremarkable. No pancreatic ductal dilatation or surrounding inflammatory changes. Spleen: The pancreatic duct is mildly dilated in the head of the pancreas measuring 3 mm. The pancreas itself is mildly atrophic. No obvious pancreatic mass identified. Adrenals/Urinary Tract: Adrenal glands are unremarkable. Kidneys are normal, without renal calculi, focal lesion, or hydronephrosis. Bladder is unremarkable. Stomach/Bowel: There is diffuse dilatation of the ascending, transverse and proximal descending colon which are filled with fluid. Findings may be consistent with enteritis/relative ileus of the  colon. There is no visible obvious obstructing mass. Stool is present in the distal portion of the colon. The small bowel is nondilated. No free air identified. Vascular/Lymphatic: Atherosclerosis of the abdominal aorta present without evidence of aneurysm. No enlarged lymph nodes are identified in the abdomen or pelvis. Reproductive: Status post hysterectomy. No adnexal masses. Other: No abdominal wall hernia or abnormality. No abdominopelvic ascites. Musculoskeletal: Mild degenerative disc disease of the lumbar spine present. IMPRESSION: 1. Dilated common bile duct measuring approximately 9-10 mm. Probable debris/calculi within the common duct including potentially an obstructing calculus/calculi in the distal duct. Consider further evaluation with MRCP versus ERCP. There is associated distention of the gallbladder without overt gallbladder inflammation by CT. 2. Dilatation of the colon containing fluid. This may be consistent with enteritis/ileus. 3. Stable hepatic cysts. Electronically Signed   By: Aletta Edouard M.D.   On: 09/24/2017 09:42   Dg Chest Portable 1 View  Result Date: 09/24/2017 CLINICAL DATA:  Increasing weakness for the past few months. Coffee-ground emesis on the way to the hospital. History of previous CVA, hypertension, and dementia. EXAM: PORTABLE CHEST 1 VIEW COMPARISON:  PA and lateral chest x-ray of November 03, 2015 FINDINGS: The lungs are adequately inflated. The heart and pulmonary vascularity are normal. The mediastinum is normal in width. There is no pleural effusion. IMPRESSION: There is no acute cardiopulmonary abnormality. Thoracic aortic atherosclerosis. Electronically Signed   By: David  Martinique M.D.   On: 09/24/2017 08:35   @IMAGES @  Assessment: 1. Leukocytosis - Attributed to possible "cholangitis". This is possible, though unlikely given normal liver associated enzymes. Question possible PUD. Patient is taking some aspirin at  home for chronic shoulder pain and  headaches. Question given dilated colon and hx of Clostridium dificile.   2. NSAID use at home.  3. Alzheimer's dementia.  4. Chronic anticoagulant intake I.e. Plavix - Held currently x 36-40 hrs. Needs held 3-5 days minimal before considering ERCP  5. Abnormal CT of the biliary tract and colon - ?possible toxic megacolon vs cbd stones as cause of incr WBC and pain.  Recommendations: 1. Continue Acid suppression.  2. Advance to soft regular diet as tolerated.  3. NPO after mn again.  4. Re-assess in AM and need for ERCP as inpatient vs. Outpatient given clinical status.    5. Check C. Dif stool.   6. Check KUB and [place] of RUQ in AM to follow ileus and dilated CBD.  Will follow.  Thank you for the consult. Please call with questions or concerns.  Olean Ree, MD  09/25/2017 3:00 PM

## 2017-09-25 NOTE — Progress Notes (Signed)
Per MD okay for RN to advance diet to full liquids.  

## 2017-09-26 ENCOUNTER — Inpatient Hospital Stay: Payer: Medicare Other

## 2017-09-26 LAB — HEPATIC FUNCTION PANEL
ALT: 7 U/L — ABNORMAL LOW (ref 14–54)
AST: 17 U/L (ref 15–41)
Albumin: 1.8 g/dL — ABNORMAL LOW (ref 3.5–5.0)
Alkaline Phosphatase: 65 U/L (ref 38–126)
BILIRUBIN TOTAL: 0.5 mg/dL (ref 0.3–1.2)
Total Protein: 4 g/dL — ABNORMAL LOW (ref 6.5–8.1)

## 2017-09-26 LAB — BASIC METABOLIC PANEL
Anion gap: 3 — ABNORMAL LOW (ref 5–15)
BUN: 19 mg/dL (ref 6–20)
CALCIUM: 8 mg/dL — AB (ref 8.9–10.3)
CO2: 22 mmol/L (ref 22–32)
CREATININE: 0.68 mg/dL (ref 0.44–1.00)
Chloride: 109 mmol/L (ref 101–111)
GFR calc Af Amer: >60 mL/min (ref >60)
GFR calc non Af Amer: >60 mL/min (ref >60)
GLUCOSE: 99 mg/dL (ref 65–99)
Potassium: 3.5 mmol/L (ref 3.5–5.1)
Sodium: 134 mmol/L — ABNORMAL LOW (ref 135–145)

## 2017-09-26 LAB — CBC
HEMATOCRIT: 22.7 % — AB (ref 35.0–47.0)
Hemoglobin: 7.2 g/dL — ABNORMAL LOW (ref 12.0–16.0)
MCH: 24.9 pg — ABNORMAL LOW (ref 26.0–34.0)
MCHC: 31.8 g/dL — AB (ref 32.0–36.0)
MCV: 78.5 fL — AB (ref 80.0–100.0)
Platelets: 601 10*3/uL — ABNORMAL HIGH (ref 150–440)
RBC: 2.89 MIL/uL — ABNORMAL LOW (ref 3.80–5.20)
RDW: 19 % — AB (ref 11.5–14.5)
WBC: 16.4 10*3/uL — ABNORMAL HIGH (ref 3.6–11.0)

## 2017-09-26 LAB — LACTATE DEHYDROGENASE: LDH: 107 U/L (ref 98–192)

## 2017-09-26 LAB — RETICULOCYTES
RBC.: 3.41 MIL/uL — AB (ref 3.80–5.20)
RETIC COUNT ABSOLUTE: 68.2 10*3/uL (ref 19.0–183.0)
RETIC CT PCT: 2 % (ref 0.4–3.1)

## 2017-09-26 LAB — IRON AND TIBC
Iron: 291 ug/dL — ABNORMAL HIGH (ref 28–170)
SATURATION RATIOS: 179 % — AB (ref 10.4–31.8)
TIBC: 163 ug/dL — ABNORMAL LOW (ref 250–450)

## 2017-09-26 LAB — FERRITIN: FERRITIN: 42 ng/mL (ref 11–307)

## 2017-09-26 LAB — GLUCOSE, CAPILLARY: GLUCOSE-CAPILLARY: 92 mg/dL (ref 65–99)

## 2017-09-26 LAB — ABO/RH: ABO/RH(D): O POS

## 2017-09-26 LAB — FOLATE: Folate: 7.8 ng/mL (ref >5.9)

## 2017-09-26 LAB — HEMOGLOBIN: Hemoglobin: 9.5 g/dL — ABNORMAL LOW (ref 12.0–16.0)

## 2017-09-26 LAB — PREPARE RBC (CROSSMATCH)

## 2017-09-26 LAB — VITAMIN B12: Vitamin B-12: 482 pg/mL (ref 180–914)

## 2017-09-26 MED ORDER — SODIUM CHLORIDE 0.9 % IV SOLN
Freq: Once | INTRAVENOUS | Status: AC
  Administered 2017-09-26: 11:00:00 via INTRAVENOUS

## 2017-09-26 MED ORDER — VANCOMYCIN 50 MG/ML ORAL SOLUTION
125.0000 mg | Freq: Four times a day (QID) | ORAL | Status: DC
  Administered 2017-09-26 – 2017-09-27 (×6): 125 mg via ORAL
  Filled 2017-09-26 (×7): qty 2.5

## 2017-09-26 NOTE — Telephone Encounter (Signed)
Sent. Thanks.   

## 2017-09-26 NOTE — Telephone Encounter (Signed)
Electronic refill Last office visit 08/06/17 See ED visit 09/24/17 Okay to refill?

## 2017-09-26 NOTE — Progress Notes (Signed)
Physical Therapy Treatment Patient Details Name: Aimee Jones MRN: [id number] DOB: 11/21/1945 Today's Date: 09/26/2017    History of Present Illness Pt admitted for abdominal pain. Pt complains of weakness and collapse resulting in fall. History includes Alzheimer's Dx, dementia, HTN, ETOH abuse, and depression.     PT Comments    Pt is making gradual progress towards goals. Pt fatigued from just receiving bath. Agreeable to limited therapy this date. Able to ambulate with improved balance this session and good technique with there-ex. Will continue to progress as able.  Follow Up Recommendations  Outpatient PT     Equipment Recommendations  None recommended by PT    Recommendations for Other Services       Precautions / Restrictions Precautions Precautions: Fall Restrictions Weight Bearing Restrictions: No    Mobility  Bed Mobility Overal bed mobility: Needs Assistance Bed Mobility: Supine to Sit     Supine to sit: Min guard     General bed mobility comments: needs cues and guidance for trunkal elevation. Once seated at EOB, able to sit with upright posture.  Transfers Overall transfer level: Modified independent Equipment used: None             General transfer comment: transfers performed with safe technique. UPright posture noted  Ambulation/Gait Ambulation/Gait assistance: Min guard Ambulation Distance (Feet): 40 Feet Assistive device: None Gait Pattern/deviations: Step-to pattern     General Gait Details: improved ambulation progressiong to reciprocal gait. No AD required. Improved balance noted this date. Declines further ambulation request   Stairs            Wheelchair Mobility    Modified Rankin (Stroke Patients Only)       Balance                                            Cognition Arousal/Alertness: Awake/alert Behavior During Therapy: WFL for tasks assessed/performed Overall Cognitive Status: Within  Functional Limits for tasks assessed                                        Exercises Other Exercises Other Exercises: supine ther-ex performed on B LE to increase strength including SLRs, heel slides, and hip abd/add. All ther-ex performed x 10 reps with supervision and safe technique.    General Comments        Pertinent Vitals/Pain Pain Assessment: No/denies pain    Home Living                      Prior Function            PT Goals (current goals can now be found in the care plan section) Acute Rehab PT Goals Patient Stated Goal: to get stronger PT Goal Formulation: With patient Time For Goal Achievement: 10/09/17 Potential to Achieve Goals: Good Progress towards PT goals: Progressing toward goals    Frequency    Min 2X/week      PT Plan Current plan remains appropriate    Co-evaluation              AM-PAC PT "6 Clicks" Daily Activity  Outcome Measure  Difficulty turning over in bed (including adjusting bedclothes, sheets and blankets)?: None Difficulty moving from lying on back to sitting on the side  of the bed? : Unable Difficulty sitting down on and standing up from a chair with arms (e.g., wheelchair, bedside commode, etc,.)?: A Little Help needed moving to and from a bed to chair (including a wheelchair)?: A Little Help needed walking in hospital room?: A Little Help needed climbing 3-5 steps with a railing? : A Little 6 Click Score: 17    End of Session Equipment Utilized During Treatment: Gait belt Activity Tolerance: Patient tolerated treatment well Patient left: in bed;with call bell/phone within reach;with bed alarm set;with family/visitor present Nurse Communication: Mobility status PT Visit Diagnosis: Unsteadiness on feet (R26.81);Muscle weakness (generalized) (M62.81);History of falling (Z91.81);Difficulty in walking, not elsewhere classified (R26.2)     Time: 6010-9323 PT Time Calculation (min) (ACUTE  ONLY): 9 min  Charges:  $Gait Training: 8-22 mins                    G Codes:  Functional Assessment Tool Used: AM-PAC 6 Clicks Basic Mobility Functional Limitation: Mobility: Walking and moving around Mobility: Walking and Moving Around Current Status (F5732): At least 40 percent but less than 60 percent impaired, limited or restricted Mobility: Walking and Moving Around Goal Status [phone]): At least 20 percent but less than 40 percent impaired, limited or restricted    Aimee Jones, PT, DPT [phone]    Aimee Jones 09/26/2017, 4:33 PM

## 2017-09-26 NOTE — Progress Notes (Signed)
Subjective: Patient seen for f/u leukocytosis, diarrhea, rectal bleeding, anemia. Patient appears in better spirits today. PO vancomycin started after stool we ordered returned C Diff  Positive. Patient not eating well per husband. No further vomiting.  Objective: Vital signs in last 24 hours: Temp:  [97.3 F (36.3 C)-98.1 F (36.7 C)] 98 F (36.7 C) (12/12 1353) Pulse Rate:  [82-98] 82 (12/12 1353) Resp:  [12-20] 12 (12/12 1353) BP: (107-144)/(58-110) 112/62 (12/12 1353) SpO2:  [93 %-100 %] 96 % (12/12 1353) Blood pressure 112/62, pulse 82, temperature 98 F (36.7 C), temperature source Oral, resp. rate 12, height 5\' 6"  (1.676 m), weight 61.7 kg (136 lb 1.6 oz), SpO2 96 %.   Intake/Output from previous day: 12/11 0701 - 12/12 0700 In: 3856.3 [P.O.:540; I.V.:2440.3; IV Piggyback:876] Out: 600 [Urine:600]  Intake/Output this shift: Total I/O In: 587.5 [P.O.:240; Blood:347.5] Out: 350 [Urine:350]   General appearance:  Spontaneously opens eyes, greets guest arriving in her room. Resp:  CTA Cardio: RRR GI: Soft, minimally tender. BS+ Extremities:  No edema.   Lab Results: Results for orders placed or performed during the hospital encounter of 09/24/17 (from the past 24 hour(s))  C difficile quick scan w PCR reflex     Status: Abnormal   Collection Time: 09/25/17  8:44 PM  Result Value Ref Range   C Diff antigen POSITIVE (A) NEGATIVE   C Diff toxin NEGATIVE NEGATIVE   C Diff interpretation Results are indeterminate. See PCR results.   Clostridium Difficile by PCR     Status: Abnormal   Collection Time: 09/25/17  8:44 PM  Result Value Ref Range   Toxigenic C. Difficile by PCR POSITIVE (A) NEGATIVE  CBC     Status: Abnormal   Collection Time: 09/26/17  3:40 AM  Result Value Ref Range   WBC 16.4 (H) 3.6 - 11.0 K/uL   RBC 2.89 (L) 3.80 - 5.20 MIL/uL   Hemoglobin 7.2 (L) 12.0 - 16.0 g/dL   HCT 22.7 (L) 35.0 - 47.0 %   MCV 78.5 (L) 80.0 - 100.0 fL   MCH 24.9 (L) 26.0 -  34.0 pg   MCHC 31.8 (L) 32.0 - 36.0 g/dL   RDW 19.0 (H) 11.5 - 14.5 %   Platelets 601 (H) 150 - 440 K/uL  Basic metabolic panel     Status: Abnormal   Collection Time: 09/26/17  3:40 AM  Result Value Ref Range   Sodium 134 (L) 135 - 145 mmol/L   Potassium 3.5 3.5 - 5.1 mmol/L   Chloride 109 101 - 111 mmol/L   CO2 22 22 - 32 mmol/L   Glucose, Bld 99 65 - 99 mg/dL   BUN 19 6 - 20 mg/dL   Creatinine, Ser 0.68 0.44 - 1.00 mg/dL   Calcium 8.0 (L) 8.9 - 10.3 mg/dL   GFR calc non Af Amer >60 >60 mL/min   GFR calc Af Amer >60 >60 mL/min   Anion gap 3 (L) 5 - 15  Hepatic function panel     Status: Abnormal   Collection Time: 09/26/17  3:40 AM  Result Value Ref Range   Total Protein 4.0 (L) 6.5 - 8.1 g/dL   Albumin 1.8 (L) 3.5 - 5.0 g/dL   AST 17 15 - 41 U/L   ALT 7 (L) 14 - 54 U/L   Alkaline Phosphatase 65 38 - 126 U/L   Total Bilirubin 0.5 0.3 - 1.2 mg/dL   Bilirubin, Direct <0.1 (L) 0.1 - 0.5 mg/dL   Indirect  Bilirubin NOT CALCULATED 0.3 - 0.9 mg/dL  ABO/Rh     Status: None   Collection Time: 09/26/17  3:40 AM  Result Value Ref Range   ABO/RH(D) O POS   Prepare RBC     Status: None   Collection Time: 09/26/17  7:30 AM  Result Value Ref Range   Order Confirmation ORDER PROCESSED BY BLOOD BANK   Glucose, capillary     Status: None   Collection Time: 09/26/17  7:49 AM  Result Value Ref Range   Glucose-Capillary 92 65 - 99 mg/dL   Comment 1 Notify RN   Hemoglobin     Status: Abnormal   Collection Time: 09/26/17  2:50 PM  Result Value Ref Range   Hemoglobin 9.5 (L) 12.0 - 16.0 g/dL  Reticulocytes     Status: Abnormal   Collection Time: 09/26/17  3:53 PM  Result Value Ref Range   Retic Ct Pct 2.0 0.4 - 3.1 %   RBC. 3.41 (L) 3.80 - 5.20 MIL/uL   Retic Count, Absolute 68.2 19.0 - 183.0 K/uL     Recent Labs    09/24/17 0758 09/24/17 1043 09/25/17 0339 09/26/17 0340 09/26/17 1450  WBC 26.2*  --  20.4* 16.4*  --   HGB 10.9* 10.1* 9.0* 7.2* 9.5*  HCT 34.3* 31.7* 28.6*  22.7*  --   PLT 889*  --  763* 601*  --    BMET Recent Labs    09/24/17 0758 09/25/17 0339 09/26/17 0340  NA 134* 132* 134*  K 4.3 3.4* 3.5  CL 104 106 109  CO2 20* 21* 22  GLUCOSE 105* 108* 99  BUN 32* 25* 19  CREATININE 0.90 0.65 0.68  CALCIUM 8.7* 8.1* 8.0*   LFT Recent Labs    09/26/17 0340  PROT 4.0*  ALBUMIN 1.8*  AST 17  ALT 7*  ALKPHOS 65  BILITOT 0.5  BILIDIR <0.1*  IBILI NOT CALCULATED   PT/INR No results for input(s): LABPROT, INR in the last 72 hours. Hepatitis Panel No results for input(s): HEPBSAG, HCVAB, HEPAIGM, HEPBIGM in the last 72 hours. C-Diff Recent Labs    09/25/17 2044  CDIFFTOX NEGATIVE   Recent Labs    09/25/17 2044  CDIFFPCR POSITIVE*     Studies/Results: Dg Abd 1 View  Result Date: 09/26/2017 CLINICAL DATA:  Acute presentation with abdominal pain. EXAM: ABDOMEN - 1 VIEW COMPARISON:  09/24/2017 FINDINGS: Gas pattern within normal limits without evidence of ileus or obstruction. No abnormal calcifications or acute bone findings. IMPRESSION: Negative radiography. Electronically Signed   By: Nelson Chimes M.D.   On: 09/26/2017 08:01   US Abdomen Limited Ruq  Result Date: 09/26/2017 CLINICAL DATA:  CT findings on December 10th consistent with common bile duct stones or sludge with dilation. EXAM: ULTRASOUND ABDOMEN LIMITED RIGHT UPPER QUADRANT COMPARISON:  Abdominal and pelvic CT scan of September 24, 2017 FINDINGS: Gallbladder: The gallbladder is mildly distended. There is no gallbladder wall thickening or pericholecystic fluid or positive sonographic Murphy's sign. No stones or sludge are observed. Common bile duct: Diameter: 5 mm.  No intraluminal stones or sludge are observed. Liver: There are 2 simple appearing left lobe cyst measuring up to 1.8 cm in diameter. There is no intrahepatic ductal dilation. Within normal limits in parenchymal echogenicity. The surface contour of the liver is smooth. Portal vein is patent on color Doppler  imaging with normal direction of blood flow towards the liver. IMPRESSION: No discrete stones or sludge within the gallbladder or common bile  duct are observed. The gallbladder is mildly distended without evidence of acute cholecystitis. The common bile duct diet diameter remains within the limits of normal. Given the CT findings, MRCP is again recommended. Simple appearing cyst in the left hepatic lobe which were demonstrated on the earlier CT scan. Electronically Signed   By: David  Martinique M.D.   On: 09/26/2017 08:54    Scheduled Inpatient Medications:   . amLODipine  2.5 mg Oral Daily  . atorvastatin  10 mg Oral Daily  . citalopram  20 mg Oral Daily  . donepezil  10 mg Oral QHS  . doxepin  25 mg Oral QHS  . fentaNYL  25 mcg Transdermal Q72H  . mouth rinse  15 mL Mouth Rinse q12n4p  . pantoprazole (PROTONIX) IV  40 mg Intravenous Q12H  . vancomycin  125 mg Oral Q6H    Continuous Inpatient Infusions:   . sodium chloride 100 mL/hr at 09/26/17 0613  . sodium chloride      PRN Inpatient Medications:  acetaminophen **OR** acetaminophen, ondansetron **OR** ondansetron (ZOFRAN) IV, oxyCODONE-acetaminophen, traZODone    Assessment:  1. C Diff colitis - Po vancomycin ordered per Dr. Tressia Miners. Agree with plan.  2. Abnormal CT bile duct c/w choledocholithiasis. With normal liver associated enzymes. Repeat US of RUQ we ordered revealed no such findings.  3. Leukocytosis, improving - Now 16,000 down from 26, 2000. Likely related to C diff.  4. Anemia - possibly secondary to GI blood loss from C diff (?Gastritris as well).   Plan:  1. Continue current mgmt. 2. Will sign off for now. Call back if I can help. Pt will likely require endoscopy as outpatient.   Elfego Giammarino K. Alice Reichert, M.D. 09/26/2017, 4:23 PM

## 2017-09-26 NOTE — Progress Notes (Signed)
[facility] [place] NAME: Aimee Jones    MR#:  [id number]  DATE OF BIRTH:  16-Jul-1946  SUBJECTIVE:  CHIEF COMPLAINT:   Chief Complaint  Patient presents with  . Weakness  . Coffee Ground Emesis   - patient appears pale, no active bleeding today - hb dropped again to 7 now- receiving 1 unit prbc transfusion - c.diff ag positive, toxin negative  REVIEW OF SYSTEMS:  Review of Systems  Constitutional: Positive for malaise/fatigue. Negative for chills and fever.       Lack of appetite  HENT: Negative for congestion, ear discharge, hearing loss and nosebleeds.   Eyes: Negative for blurred vision and double vision.  Respiratory: Negative for cough, shortness of breath and wheezing.   Cardiovascular: Negative for chest pain, palpitations and leg swelling.  Gastrointestinal: Negative for abdominal pain, constipation, diarrhea, nausea and vomiting.  Genitourinary: Negative for dysuria.  Musculoskeletal: Positive for joint pain and myalgias.  Neurological: Negative for dizziness, speech change, focal weakness, seizures and headaches.  Psychiatric/Behavioral: Negative for depression.    DRUG ALLERGIES:   Allergies  Allergen Reactions  . Tramadol Other (See Comments)    Would avoid.  History of seizure on medication  . Latex Rash  . Penicillins Rash    .Marland KitchenHas patient had a PCN reaction causing immediate rash, facial/tongue/throat swelling, SOB or lightheadedness with hypotension: No Has patient had a PCN reaction causing severe rash involving mucus membranes or skin necrosis: No Has patient had a PCN reaction that required hospitalization No Has patient had a PCN reaction occurring within the last 10 years: No If all of the above answers are "NO", then may proceed with Cephalosporin use.      VITALS:  Blood pressure 112/62, pulse 82, temperature 98 F (36.7 C), temperature source Oral, resp. rate 12, height 5\' 6"  (1.676 m),  weight 61.7 kg (136 lb 1.6 oz), SpO2 96 %.  PHYSICAL EXAMINATION:  Physical Exam  GENERAL:  71 y.o.-year-old patient lying in the bed with no acute distress.  EYES: Pupils equal, round, reactive to light and accommodation. No scleral icterus. Extraocular muscles intact.  HEENT: Head atraumatic, normocephalic. Oropharynx and nasopharynx clear.  NECK:  Supple, no jugular venous distention. No thyroid enlargement, no tenderness.  LUNGS: Normal breath sounds bilaterally, no wheezing, rales,rhonchi or crepitation. No use of accessory muscles of respiration.  CARDIOVASCULAR: S1, S2 normal. No  rubs, or gallops. 2/6 systolic murmur present ABDOMEN: Soft, nontender, nondistended. Bowel sounds present. No organomegaly or mass.  EXTREMITIES: No pedal edema, cyanosis, or clubbing.  NEUROLOGIC: Cranial nerves II through XII are intact. Muscle strength 5/5 in all extremities. Sensation intact. Gait not checked. Global weakness noted. PSYCHIATRIC: The patient is alert and oriented to self.  SKIN: No obvious rash, lesion, or ulcer.    LABORATORY PANEL:   CBC Recent Labs  Lab 09/26/17 0340  WBC 16.4*  HGB 7.2*  HCT 22.7*  PLT 601*   ------------------------------------------------------------------------------------------------------------------  Chemistries  Recent Labs  Lab 09/26/17 0340  NA 134*  K 3.5  CL 109  CO2 22  GLUCOSE 99  BUN 19  CREATININE 0.68  CALCIUM 8.0*  AST 17  ALT 7*  ALKPHOS 65  BILITOT 0.5   ------------------------------------------------------------------------------------------------------------------  Cardiac Enzymes Recent Labs  Lab 09/24/17 0758  TROPONINI <0.03   ------------------------------------------------------------------------------------------------------------------  RADIOLOGY:  Dg Abd 1 View  Result Date: 09/26/2017 CLINICAL DATA:  Acute presentation with abdominal pain. EXAM: ABDOMEN - 1 VIEW COMPARISON:  09/24/2017 FINDINGS: Gas  pattern within normal limits without evidence of ileus or obstruction. No abnormal calcifications or acute bone findings. IMPRESSION: Negative radiography. Electronically Signed   By: Nelson Chimes M.D.   On: 09/26/2017 08:01   US Abdomen Limited Ruq  Result Date: 09/26/2017 CLINICAL DATA:  CT findings on December 10th consistent with common bile duct stones or sludge with dilation. EXAM: ULTRASOUND ABDOMEN LIMITED RIGHT UPPER QUADRANT COMPARISON:  Abdominal and pelvic CT scan of September 24, 2017 FINDINGS: Gallbladder: The gallbladder is mildly distended. There is no gallbladder wall thickening or pericholecystic fluid or positive sonographic Murphy's sign. No stones or sludge are observed. Common bile duct: Diameter: 5 mm.  No intraluminal stones or sludge are observed. Liver: There are 2 simple appearing left lobe cyst measuring up to 1.8 cm in diameter. There is no intrahepatic ductal dilation. Within normal limits in parenchymal echogenicity. The surface contour of the liver is smooth. Portal vein is patent on color Doppler imaging with normal direction of blood flow towards the liver. IMPRESSION: No discrete stones or sludge within the gallbladder or common bile duct are observed. The gallbladder is mildly distended without evidence of acute cholecystitis. The common bile duct diet diameter remains within the limits of normal. Given the CT findings, MRCP is again recommended. Simple appearing cyst in the left hepatic lobe which were demonstrated on the earlier CT scan. Electronically Signed   By: David  Martinique M.D.   On: 09/26/2017 08:54    EKG:   Orders placed or performed during the hospital encounter of 09/24/17  . EKG 12-Lead  . EKG 12-Lead    ASSESSMENT AND PLAN:   71 year old female with past medical history significant for dementia, hypertension, GERD, hyponatremia, chronic pain syndrome, osteoporosis, arthritis presents to hospital secondary to abdominal pain, nausea and vomiting.  1.  Acute biliary colic--came in with abdominal pain and nausea vomiting. WBC significantly elevated -Patient remains afebrile. LFTs are within normal limits. -CT of the abdomen showing dilated CBD of up to 10 mm and possible calculi on admission and patient took Plavix up until admission so no ERCP was sent immediately. -Repeat abdominal ultrasound showing normal size CBD and no gallstones seen. -Conservative management at this time.  started on a diet. Monitor LFTs. Outpatient follow-up with GI as needed.  -Appreciate GI consult  2. C.diff colitis- the toxin is negative, symptomatic and C. difficile antigen positive. Change antibiotics to oral vancomycin now. Advance diet as tolerated to a solid diet today-  3. Acute on chronic iron deficiency anemia and thrombocytosis-appreciate hematology consult. -Did have coffee-ground emesis on admission with hemoglobin dropped to 7.2 from 9. One unit packed RBC received today. -Check anemia panel. WBC is improving. Also receiving IV iron. -If no active bleeding and hemoglobin is stable as an outpatient follow-up recommended  4. Dementia-seems to be at baseline. Continue Aricept, Celexa and doxepin  5. Hypokalemia-  replaced  6. Chronic pain syndrome-continue home medications. On fentanyl patch and Percocet. -Has chronic musculoskeletal pain especially in the neck and shoulder region  Physical therapy consulted. Patient ambulatory at baseline Discharge with home health whenever medically stable    All the records are reviewed and case discussed with Care Management/Social Workerr. Management plans discussed with the patient, family and they are in agreement.  CODE STATUS: Full code  TOTAL TIME TAKING CARE OF THIS PATIENT: 38 minutes.   POSSIBLE D/C IN 1-2 DAYS, DEPENDING ON CLINICAL CONDITION.   Gladstone Lighter M.D on 09/26/2017 at 3:05 PM  Between 7am to 6pm - Pager - [phone]  After 6pm go to [email or web address] - password [email or web address]  [facility]  Office  [phone]  CC: Primary care physician; Tonia Ghent, MD

## 2017-09-26 NOTE — Progress Notes (Signed)
PT Cancellation Note  Patient Details Name: Aimee Jones MRN: [id number] DOB: 12-Sep-1946   Cancelled Treatment:    Reason Eval/Treat Not Completed: Other (comment). Treatment attempted, however pt currently receiving bath. Will re-attempt.   Ivy Meriwether 09/26/2017, 2:48 PM  Greggory Stallion, PT, DPT [phone]

## 2017-09-27 ENCOUNTER — Inpatient Hospital Stay: Payer: Medicare Other

## 2017-09-27 LAB — TYPE AND SCREEN
ABO/RH(D): O POS
ANTIBODY SCREEN: NEGATIVE
Unit division: 0

## 2017-09-27 LAB — COMPREHENSIVE METABOLIC PANEL
ALBUMIN: 2 g/dL — AB (ref 3.5–5.0)
ALT: 10 U/L — ABNORMAL LOW (ref 14–54)
AST: 23 U/L (ref 15–41)
Alkaline Phosphatase: 62 U/L (ref 38–126)
Anion gap: 6 (ref 5–15)
BUN: 12 mg/dL (ref 6–20)
CHLORIDE: 112 mmol/L — AB (ref 101–111)
CO2: 22 mmol/L (ref 22–32)
Calcium: 7.9 mg/dL — ABNORMAL LOW (ref 8.9–10.3)
Creatinine, Ser: 0.8 mg/dL (ref 0.44–1.00)
GFR calc Af Amer: >60 mL/min (ref >60)
GFR calc non Af Amer: >60 mL/min (ref >60)
GLUCOSE: 95 mg/dL (ref 65–99)
POTASSIUM: 3.5 mmol/L (ref 3.5–5.1)
SODIUM: 140 mmol/L (ref 135–145)
Total Bilirubin: 0.5 mg/dL (ref 0.3–1.2)
Total Protein: 4.3 g/dL — ABNORMAL LOW (ref 6.5–8.1)

## 2017-09-27 LAB — CBC
HCT: 27 % — ABNORMAL LOW (ref 35.0–47.0)
Hemoglobin: 8.7 g/dL — ABNORMAL LOW (ref 12.0–16.0)
MCH: 25.9 pg — ABNORMAL LOW (ref 26.0–34.0)
MCHC: 32.4 g/dL (ref 32.0–36.0)
MCV: 79.8 fL — ABNORMAL LOW (ref 80.0–100.0)
PLATELETS: 567 10*3/uL — AB (ref 150–440)
RBC: 3.38 MIL/uL — ABNORMAL LOW (ref 3.80–5.20)
RDW: 18.1 % — AB (ref 11.5–14.5)
WBC: 13 10*3/uL — AB (ref 3.6–11.0)

## 2017-09-27 LAB — HAPTOGLOBIN: HAPTOGLOBIN: 258 mg/dL — AB (ref 34–200)

## 2017-09-27 LAB — BPAM RBC
BLOOD PRODUCT EXPIRATION DATE: 201812182359
ISSUE DATE / TIME: 201812121118
Unit Type and Rh: 5100

## 2017-09-27 LAB — GLUCOSE, CAPILLARY: Glucose-Capillary: 92 mg/dL (ref 65–99)

## 2017-09-27 MED ORDER — VANCOMYCIN HCL 125 MG PO CAPS: 125.0000 mg | ORAL_CAPSULE | Freq: Four times a day (QID) | ORAL | 0 refills | Status: DC

## 2017-09-27 MED ORDER — GADOBENATE DIMEGLUMINE 529 MG/ML IV SOLN
10.0000 mL | Freq: Once | INTRAVENOUS | Status: AC | PRN
  Administered 2017-09-27: 10 mL via INTRAVENOUS

## 2017-09-27 NOTE — Progress Notes (Signed)
Physical Therapy Treatment Patient Details Name: Aimee Jones MRN: [id number] DOB: 1945-12-19 Today's Date: 09/27/2017    History of Present Illness Pt admitted for abdominal pain. Pt complains of weakness and collapse resulting in fall. History includes Alzheimer's Dx, dementia, HTN, ETOH abuse, and depression.     PT Comments    Pt agreed on 3rd attempt this am.  Pt was transitioning to bedside commode with nurse tech upon arrival.  She was able to continue to ambulate 6 laps in room without assistive device although she did start to hold IV pole about half way through.  She had some minor imbalances but was able to recover without assist.  General safety was reviewed with pt and husband.    Follow Up Recommendations  Outpatient PT     Equipment Recommendations  None recommended by PT    Recommendations for Other Services       Precautions / Restrictions Precautions Precautions: Fall Restrictions Weight Bearing Restrictions: No    Mobility  Bed Mobility Overal bed mobility: Needs Assistance Bed Mobility: Supine to Sit;Sit to Supine     Supine to sit: Min guard Sit to supine: Min guard      Transfers Overall transfer level: Modified independent Equipment used: None             General transfer comment: transfers performed with safe technique. UPright posture noted  Ambulation/Gait Ambulation/Gait assistance: Min guard Ambulation Distance (Feet): 60 Feet Assistive device: None Gait Pattern/deviations: Step-through pattern   Gait velocity interpretation: Below normal speed for age/gender General Gait Details: No AD used, pt did start to hold IV pole after about 30'.     Stairs            Wheelchair Mobility    Modified Rankin (Stroke Patients Only)       Balance Overall balance assessment: Modified Independent                                          Cognition Arousal/Alertness: Awake/alert   Overall Cognitive  Status: History of cognitive impairments - at baseline                                        Exercises      General Comments        Pertinent Vitals/Pain Pain Assessment: 0-10 Pain Score: 6  Pain Location: Bilateral shoulders Pain Descriptors / Indicators: Aching Pain Intervention(s): Limited activity within patient's tolerance;Premedicated before session    Home Living                      Prior Function            PT Goals (current goals can now be found in the care plan section) Progress towards PT goals: Progressing toward goals    Frequency    Min 2X/week      PT Plan Current plan remains appropriate    Co-evaluation              AM-PAC PT "6 Clicks" Daily Activity  Outcome Measure  Difficulty turning over in bed (including adjusting bedclothes, sheets and blankets)?: None Difficulty moving from lying on back to sitting on the side of the bed? : None Difficulty sitting down on and standing up from  a chair with arms (e.g., wheelchair, bedside commode, etc,.)?: A Little Help needed moving to and from a bed to chair (including a wheelchair)?: A Little Help needed walking in hospital room?: A Little Help needed climbing 3-5 steps with a railing? : A Little 6 Click Score: 20    End of Session Equipment Utilized During Treatment: Gait belt Activity Tolerance: Patient tolerated treatment well Patient left: in bed;with call bell/phone within reach;with bed alarm set;with family/visitor present Nurse Communication: Mobility status       Time: 1100-1113 PT Time Calculation (min) (ACUTE ONLY): 13 min  Charges:  $Gait Training: 8-22 mins                    G Codes:       Chesley Noon, PTA 09/27/17, 11:42 AM

## 2017-09-27 NOTE — Care Management Important Message (Signed)
Important Message  Patient Details  Name: Aimee Jones MRN: [id number] Date of Birth: 08-01-1946   Medicare Important Message Given:  Yes    Beverly Sessions, RN 09/27/2017, 3:41 PM

## 2017-09-27 NOTE — Progress Notes (Signed)
Pt's IV removed with no incident. Released to her husband.

## 2017-09-27 NOTE — Discharge Summary (Signed)
[facility] NAME: Aimee Jones    MR#:  [id number]  DATE OF BIRTH:  11-Nov-1945  DATE OF ADMISSION:  09/24/2017 ADMITTING PHYSICIAN: Epifanio Lesches, MD  DATE OF DISCHARGE: 09/27/2017  PRIMARY CARE PHYSICIAN: Tonia Ghent, MD    ADMISSION DIAGNOSIS:  Biliary obstruction [K83.1] Coffee ground emesis [K92.0]  DISCHARGE DIAGNOSIS:  Acute biliary colic workup negative--- symptoms seem to have resolved C. difficile colitis Acute on chronic iron deficiency anemia and thrombocytosis Chronic pain syndrome SECONDARY DIAGNOSIS:   Past Medical History:  Diagnosis Date  . Allergy   . Anxiety    panic attacks- on occas.   . Arthritis    osteoporosis - especially hips.   . Asthma   . Confusion   . Dementia   . Depression   . Diverticulosis   . ETOH abuse   . Falls   . GERD (gastroesophageal reflux disease)   . Hyperlipidemia   . Hypertension   . Hyponatremia 07/05/2015  . Memory loss   . Normal 24 hour ambulatory pH monitoring study    good acid suppression  . Observed seizure-like activity ([place]) 07/05/2015   observed by husband , Dr Doy Mince  . Seizures ([place])   . Shortness of breath   . Stroke [facility])     HOSPITAL COURSE:   71 year old female with past medical history significant for dementia, hypertension, GERD, hyponatremia, chronic pain syndrome, osteoporosis, arthritis presents to hospital secondary to abdominal pain, nausea and vomiting.  1. Acute biliary colic--came in with abdominal pain and nausea vomiting. WBC significantly elevated -Patient remains afebrile. LFTs are within normal limits. -CT of the abdomen showing dilated CBD of up to 10 mm and possible calculi on admission and patient took Plavix up until admission so no ERCP was sent immediately. -Repeat abdominal ultrasound showing normal size CBD and no gallstones seen. -MRCP was done confirmed normal-sized CBD and no CBD  stones -Conservative management at this time.  started on a diet. Monitor LFTs. Outpatient follow-up with GI as needed.  -Appreciate GI consult -Resume Plavix  2. C.diff colitis- the toxin is negative, symptomatic and C. difficile antigen positive. Change antibiotics to oral vancomycin now. Advance diet as tolerated to a solid diet today  3. Acute on chronic iron deficiency anemia and thrombocytosis-appreciate hematology consult. -Did have coffee-ground emesis on admission with hemoglobin dropped to 7.2 from 9. One unit packed RBC received today. - WBC is improving. Also receiving IV iron. -Hemoglobin is 8.7 today. -If no active bleeding and hemoglobin is stable as an outpatient follow-up recommended  4. Dementia-seems to be at baseline. Continue Aricept, Celexa and doxepin  5. Hypokalemia-  replaced  6. Chronic pain syndrome-continue home medications. On fentanyl patch and Percocet. -Has chronic musculoskeletal pain especially in the neck and shoulder region  Physical therapy consulted. Patient ambulatory at baseline Discharge today with outpatient PT. -Plan was discussed with patient's husband who appreciated the call and talk earlier in the room in the morning.  CONSULTS OBTAINED:  Treatment Team:  Lin Landsman, MD Lloyd Huger, MD  DRUG ALLERGIES:   Allergies  Allergen Reactions  . Tramadol Other (See Comments)    Would avoid.  History of seizure on medication  . Latex Rash  . Penicillins Rash    .Marland KitchenHas patient had a PCN reaction causing immediate rash, facial/tongue/throat swelling, SOB or lightheadedness with hypotension: No Has patient had a PCN reaction causing severe rash involving mucus membranes or skin  necrosis: No Has patient had a PCN reaction that required hospitalization No Has patient had a PCN reaction occurring within the last 10 years: No If all of the above answers are "NO", then may proceed with Cephalosporin use.      DISCHARGE  MEDICATIONS:   Allergies as of 09/27/2017      Reactions   Tramadol Other (See Comments)   Would avoid.  History of seizure on medication   Latex Rash   Penicillins Rash   .Marland KitchenHas patient had a PCN reaction causing immediate rash, facial/tongue/throat swelling, SOB or lightheadedness with hypotension: No Has patient had a PCN reaction causing severe rash involving mucus membranes or skin necrosis: No Has patient had a PCN reaction that required hospitalization No Has patient had a PCN reaction occurring within the last 10 years: No If all of the above answers are "NO", then may proceed with Cephalosporin use.      Medication List    TAKE these medications   acetaminophen 325 MG tablet Commonly known as:  TYLENOL Take 2 tablets (650 mg total) by mouth 3 (three) times daily as needed.   amLODipine 2.5 MG tablet Commonly known as:  NORVASC TAKE 1 TABLET BY MOUTH EVERY DAY   atorvastatin 10 MG tablet Commonly known as:  LIPITOR TAKE 1 TABLET BY MOUTH EVERY DAY   citalopram 20 MG tablet Commonly known as:  CELEXA TAKE 1 TABLET BY MOUTH EVERY DAY   clopidogrel 75 MG tablet Commonly known as:  PLAVIX TAKE 1 TABLET (75 MG TOTAL) BY MOUTH DAILY.   donepezil 10 MG tablet Commonly known as:  ARICEPT TAKE 1 TABLET BY MOUTH DAILY AT BEDTIME   doxepin 25 MG capsule Commonly known as:  SINEQUAN TAKE 1 CAPSULE BY MOUTH DAILY AT BEDTIME   EYE VITAMINS Caps Take 1 capsule by mouth daily.   Vitamins/Minerals Tabs Take by mouth.   fentaNYL 25 MCG/HR patch Commonly known as:  DURAGESIC - dosed mcg/hr Place 25 mcg onto the skin every 3 (three) days.   FISH OIL PO Take by mouth.   fluticasone 50 MCG/ACT nasal spray Commonly known as:  FLONASE Place into the nose.   hydrocortisone 2.5 % cream APPLY RECTALLY 2 TIMES DAILY   ibuprofen 200 MG tablet Commonly known as:  MOTRIN IB Take 2 tablets (400 mg total) by mouth 3 (three) times daily as needed (with food).   ondansetron  8 MG tablet Commonly known as:  ZOFRAN Take 8 mg by mouth every 8 (eight) hours as needed.   oxyCODONE-acetaminophen 7.5-325 MG tablet Commonly known as:  PERCOCET Take 1 tablet by mouth 3 (three) times daily.   pantoprazole 20 MG tablet Commonly known as:  PROTONIX TAKE 1 TABLET BY MOUTH EVERY DAY   polyvinyl alcohol 1.4 % ophthalmic solution Commonly known as:  LIQUIFILM TEARS Place 1 drop into both eyes as needed.   SYMBICORT 160-4.5 MCG/ACT inhaler Generic drug:  budesonide-formoterol INHALE 2 PUFFS INTO THE LUNGS 2 (TWO) TIMES DAILY AS NEEDED (SHORTNESS OF BREATH).   vancomycin 125 MG capsule Commonly known as:  VANCOCIN HCL Take 1 capsule (125 mg total) by mouth 4 (four) times daily.   vitamin E 400 UNIT capsule Take by mouth.       If you experience worsening of your admission symptoms, develop shortness of breath, life threatening emergency, suicidal or homicidal thoughts you must seek medical attention immediately by calling 911 or calling your MD immediately  if symptoms less severe.  You Must read  complete instructions/literature along with all the possible adverse reactions/side effects for all the Medicines you take and that have been prescribed to you. Take any new Medicines after you have completely understood and accept all the possible adverse reactions/side effects.   Please note  You were cared for by a hospitalist during your hospital stay. If you have any questions about your discharge medications or the care you received while you were in the hospital after you are discharged, you can call the unit and asked to speak with the hospitalist on call if the hospitalist that took care of you is not available. Once you are discharged, your primary care physician will handle any further medical issues. Please note that NO REFILLS for any discharge medications will be authorized once you are discharged, as it is imperative that you return to your primary care  physician (or establish a relationship with a primary care physician if you do not have one) for your aftercare needs so that they can reassess your need for medications and monitor your lab values. Today   SUBJECTIVE   No new complaints other than generalized body pain appears to be chronic  VITAL SIGNS:  Blood pressure 138/67, pulse 80, temperature 97.7 F (36.5 C), temperature source Oral, resp. rate 20, height 5\' 6"  (1.676 m), weight 62.2 kg (137 lb 3.2 oz), SpO2 95 %.  I/O:    Intake/Output Summary (Last 24 hours) at 09/27/2017 1441 Last data filed at 09/27/2017 0544 Gross per 24 hour  Intake 1231.56 ml  Output -  Net 1231.56 ml    PHYSICAL EXAMINATION:  GENERAL:  71 y.o.-year-old patient lying in the bed with no acute distress.  EYES: Pupils equal, round, reactive to light and accommodation. No scleral icterus. Extraocular muscles intact.  HEENT: Head atraumatic, normocephalic. Oropharynx and nasopharynx clear.  NECK:  Supple, no jugular venous distention. No thyroid enlargement, no tenderness.  LUNGS: Normal breath sounds bilaterally, no wheezing, rales,rhonchi or crepitation. No use of accessory muscles of respiration.  CARDIOVASCULAR: S1, S2 normal. No murmurs, rubs, or gallops.  ABDOMEN: Soft, non-tender, non-distended. Bowel sounds present. No organomegaly or mass.  EXTREMITIES: No pedal edema, cyanosis, or clubbing.  NEUROLOGIC: Cranial nerves II through XII are intact. Muscle strength 5/5 in all extremities. Sensation intact. Gait not checked.  PSYCHIATRIC: The patient is alert and oriented x 3.  SKIN: No obvious rash, lesion, or ulcer.   DATA REVIEW:   CBC  Recent Labs  Lab 09/27/17 0332  WBC 13.0*  HGB 8.7*  HCT 27.0*  PLT 567*    Chemistries  Recent Labs  Lab 09/27/17 0332  NA 140  K 3.5  CL 112*  CO2 22  GLUCOSE 95  BUN 12  CREATININE 0.80  CALCIUM 7.9*  AST 23  ALT 10*  ALKPHOS 62  BILITOT 0.5    Microbiology Results   Recent  Results (from the past 240 hour(s))  Blood Culture (routine x 2)     Status: None (Preliminary result)   Collection Time: 09/24/17  3:49 PM  Result Value Ref Range Status   Specimen Description   Final    BLOOD Blood Culture results may not be optimal due to an inadequate volume of blood received in culture bottles   Special Requests LEFT ANTECUBITAL  Final   Culture NO GROWTH 3 DAYS  Final   Report Status PENDING  Incomplete  Blood Culture (routine x 2)     Status: None (Preliminary result)   Collection Time: 09/24/17  4:39 PM  Result Value Ref Range Status   Specimen Description BLOOD LEFT AC  Final   Special Requests   Final    BOTTLES DRAWN AEROBIC AND ANAEROBIC Blood Culture adequate volume   Culture NO GROWTH 3 DAYS  Final   Report Status PENDING  Incomplete  C difficile quick scan w PCR reflex     Status: Abnormal   Collection Time: 09/25/17  8:44 PM  Result Value Ref Range Status   C Diff antigen POSITIVE (A) NEGATIVE Final   C Diff toxin NEGATIVE NEGATIVE Final   C Diff interpretation Results are indeterminate. See PCR results.  Final  Clostridium Difficile by PCR     Status: Abnormal   Collection Time: 09/25/17  8:44 PM  Result Value Ref Range Status   Toxigenic C. Difficile by PCR POSITIVE (A) NEGATIVE Final    Comment: Positive for toxigenic C. difficile with little to no toxin production. Only treat if clinical presentation suggests symptomatic illness.    RADIOLOGY:  Dg Abd 1 View  Result Date: 09/26/2017 CLINICAL DATA:  Acute presentation with abdominal pain. EXAM: ABDOMEN - 1 VIEW COMPARISON:  09/24/2017 FINDINGS: Gas pattern within normal limits without evidence of ileus or obstruction. No abnormal calcifications or acute bone findings. IMPRESSION: Negative radiography. Electronically Signed   By: Nelson Chimes M.D.   On: 09/26/2017 08:01   Mr 3d Recon At Scanner  Result Date: 09/27/2017 CLINICAL DATA:  Elevated liver function tests. Thrombocytosis. Biliary  ductal dilatation on recent CT. EXAM: MRI ABDOMEN WITHOUT AND WITH CONTRAST (INCLUDING MRCP) TECHNIQUE: Multiplanar multisequence MR imaging of the abdomen was performed both before and after the administration of intravenous contrast. Heavily T2-weighted images of the biliary and pancreatic ducts were obtained, and three-dimensional MRCP images were rendered by post processing. CONTRAST:  36mL MULTIHANCE GADOBENATE DIMEGLUMINE 529 MG/ML IV SOLN COMPARISON:  CT on 09/24/2017 FINDINGS: Lower chest: Tiny bilateral pleural effusions. Hepatobiliary: Image degradation by motion artifact noted. No hepatic masses identified. Multiple small cysts are seen throughout the right and left hepatic lobes, largest in lateral segment of left lobe measuring 2.2 cm. Diffuse parenchymal T2 hypointensity, consistent with hemosiderosis. Gallbladder is unremarkable. No evidence of biliary duct dilatation, with common bile duct measuring 6 mm. No evidence of choledocholithiasis. Pancreas: No mass or inflammatory changes. Normal signal intensity. Spleen: Normal size. No splenic masses are identified. Diffuse T2 hypointensity, consistent with hemosiderosis. Adrenals/Urinary Tract: No masses identified. No evidence of hydronephrosis. Stomach/Bowel: Visualized portions within abdomen are unremarkable. Vascular/Lymphatic: No pathologically enlarged lymph nodes identified. No abdominal aortic aneurysm. Other:  None. Musculoskeletal: No suspicious bone lesions identified. Diffuse bone marrow T2 hypointensity, consistent with iron overload. IMPRESSION: Multiple small hepatic cysts. No evidence of hepatic mass or biliary ductal dilatation. Hemosiderosis. Tiny bilateral pleural effusions. Electronically Signed   By: Earle Gell M.D.   On: 09/27/2017 14:10   Mr Abdomen Mrcp W Wo Contast  Result Date: 09/27/2017 CLINICAL DATA:  Elevated liver function tests. Thrombocytosis. Biliary ductal dilatation on recent CT. EXAM: MRI ABDOMEN WITHOUT AND  WITH CONTRAST (INCLUDING MRCP) TECHNIQUE: Multiplanar multisequence MR imaging of the abdomen was performed both before and after the administration of intravenous contrast. Heavily T2-weighted images of the biliary and pancreatic ducts were obtained, and three-dimensional MRCP images were rendered by post processing. CONTRAST:  37mL MULTIHANCE GADOBENATE DIMEGLUMINE 529 MG/ML IV SOLN COMPARISON:  CT on 09/24/2017 FINDINGS: Lower chest: Tiny bilateral pleural effusions. Hepatobiliary: Image degradation by motion artifact noted. No hepatic masses identified. Multiple  small cysts are seen throughout the right and left hepatic lobes, largest in lateral segment of left lobe measuring 2.2 cm. Diffuse parenchymal T2 hypointensity, consistent with hemosiderosis. Gallbladder is unremarkable. No evidence of biliary duct dilatation, with common bile duct measuring 6 mm. No evidence of choledocholithiasis. Pancreas: No mass or inflammatory changes. Normal signal intensity. Spleen: Normal size. No splenic masses are identified. Diffuse T2 hypointensity, consistent with hemosiderosis. Adrenals/Urinary Tract: No masses identified. No evidence of hydronephrosis. Stomach/Bowel: Visualized portions within abdomen are unremarkable. Vascular/Lymphatic: No pathologically enlarged lymph nodes identified. No abdominal aortic aneurysm. Other:  None. Musculoskeletal: No suspicious bone lesions identified. Diffuse bone marrow T2 hypointensity, consistent with iron overload. IMPRESSION: Multiple small hepatic cysts. No evidence of hepatic mass or biliary ductal dilatation. Hemosiderosis. Tiny bilateral pleural effusions. Electronically Signed   By: Earle Gell M.D.   On: 09/27/2017 14:10   US Abdomen Limited Ruq  Result Date: 09/26/2017 CLINICAL DATA:  CT findings on December 10th consistent with common bile duct stones or sludge with dilation. EXAM: ULTRASOUND ABDOMEN LIMITED RIGHT UPPER QUADRANT COMPARISON:  Abdominal and pelvic CT  scan of September 24, 2017 FINDINGS: Gallbladder: The gallbladder is mildly distended. There is no gallbladder wall thickening or pericholecystic fluid or positive sonographic Murphy's sign. No stones or sludge are observed. Common bile duct: Diameter: 5 mm.  No intraluminal stones or sludge are observed. Liver: There are 2 simple appearing left lobe cyst measuring up to 1.8 cm in diameter. There is no intrahepatic ductal dilation. Within normal limits in parenchymal echogenicity. The surface contour of the liver is smooth. Portal vein is patent on color Doppler imaging with normal direction of blood flow towards the liver. IMPRESSION: No discrete stones or sludge within the gallbladder or common bile duct are observed. The gallbladder is mildly distended without evidence of acute cholecystitis. The common bile duct diet diameter remains within the limits of normal. Given the CT findings, MRCP is again recommended. Simple appearing cyst in the left hepatic lobe which were demonstrated on the earlier CT scan. Electronically Signed   By: David  Martinique M.D.   On: 09/26/2017 08:54     Management plans discussed with the patient, family and they are in agreement.  CODE STATUS:     Code Status Orders  (From admission, onward)        Start     Ordered   09/24/17 1310  Full code  Continuous     09/24/17 1310    Code Status History    Date Active Date Inactive Code Status Order ID Comments User Context   08/01/2016 13:45 08/01/2016 18:48 Full Code [id number]  Milly Jakob, MD Inpatient   07/04/2015 23:58 07/07/2015 19:30 Full Code [id number]  Theressa Millard, MD ED   06/22/2015 20:21 06/29/2015 18:22 Full Code [id number]  Truett Mainland, DO Inpatient   10/14/2014 11:24 10/14/2014 15:07 Full Code [id number]  Evans Lance, MD Inpatient   10/12/2014 18:42 10/14/2014 11:24 Full Code [id number]  Verlee Monte, MD ED   08/13/2014 12:28 08/14/2014 15:06 Full Code [id number]  Jenetta Loges, PA-C Inpatient       TOTAL TIME TAKING CARE OF THIS PATIENT: *40* minutes.    Fritzi Mandes M.D on 09/27/2017 at 2:41 PM  Between 7am to 6pm - Pager - [phone] After 6pm go to [email or web address] - password [email or web address]  Office  [phone]  CC: Primary care physician; Tonia Ghent, MD

## 2017-09-27 NOTE — Care Management (Signed)
Patient to discharge on oral Vanc.  Script was escribed to [facility].  RNCM checked with [facility] and with patient's insurance out of pocket cost would be $261.  Patient is to receive evening dose today, then discharge.  [facility] outpatient pharmacy will be able to compound the oral solution Vanc, and will be ready to pick up tomorrow.  Script has been faxed to [facility] at outpatient pharmacy, and patient's husband has been notified.  RNCM signing off.

## 2017-09-28 ENCOUNTER — Telehealth: Payer: Self-pay | Admitting: *Deleted

## 2017-09-28 NOTE — Telephone Encounter (Signed)
Lm with pts husband requesting return call to complete TCM and confirm hosp f/u appt

## 2017-09-29 LAB — CULTURE, BLOOD (ROUTINE X 2)
Culture: NO GROWTH
Culture: NO GROWTH
Special Requests: ADEQUATE

## 2017-10-01 ENCOUNTER — Ambulatory Visit (INDEPENDENT_AMBULATORY_CARE_PROVIDER_SITE_OTHER): Payer: Medicare Other | Admitting: *Deleted

## 2017-10-01 DIAGNOSIS — I639 Cerebral infarction, unspecified: Secondary | ICD-10-CM

## 2017-10-01 NOTE — Progress Notes (Signed)
[facility] Summary Report / Loop Recorder 

## 2017-10-03 DIAGNOSIS — M19019 Primary osteoarthritis, unspecified shoulder: Secondary | ICD-10-CM | POA: Diagnosis not present

## 2017-10-03 DIAGNOSIS — M792 Neuralgia and neuritis, unspecified: Secondary | ICD-10-CM | POA: Diagnosis not present

## 2017-10-03 DIAGNOSIS — M47812 Spondylosis without myelopathy or radiculopathy, cervical region: Secondary | ICD-10-CM | POA: Diagnosis not present

## 2017-10-03 DIAGNOSIS — G894 Chronic pain syndrome: Secondary | ICD-10-CM | POA: Diagnosis not present

## 2017-10-11 ENCOUNTER — Ambulatory Visit (INDEPENDENT_AMBULATORY_CARE_PROVIDER_SITE_OTHER): Payer: Medicare Other | Admitting: Family Medicine

## 2017-10-11 ENCOUNTER — Encounter: Payer: Self-pay | Admitting: Family Medicine

## 2017-10-11 VITALS — BP 126/72 | HR 67 | Temp 97.9°F | Wt 134.2 lb

## 2017-10-11 DIAGNOSIS — R109 Unspecified abdominal pain: Secondary | ICD-10-CM | POA: Diagnosis not present

## 2017-10-11 DIAGNOSIS — I639 Cerebral infarction, unspecified: Secondary | ICD-10-CM

## 2017-10-11 DIAGNOSIS — A0472 Enterocolitis due to Clostridium difficile, not specified as recurrent: Secondary | ICD-10-CM

## 2017-10-11 LAB — CBC WITH DIFFERENTIAL/PLATELET
Basophils Absolute: 0.2 10*3/uL — ABNORMAL HIGH (ref 0.0–0.1)
Basophils Relative: 2 % (ref 0.0–3.0)
EOS PCT: 4.9 % (ref 0.0–5.0)
Eosinophils Absolute: 0.4 10*3/uL (ref 0.0–0.7)
HCT: 36.9 % (ref 36.0–46.0)
Hemoglobin: 11.4 g/dL — ABNORMAL LOW (ref 12.0–15.0)
LYMPHS ABS: 2.1 10*3/uL (ref 0.7–4.0)
Lymphocytes Relative: 23.6 % (ref 12.0–46.0)
MCHC: 30.8 g/dL (ref 30.0–36.0)
MCV: 84.6 fl (ref 78.0–100.0)
MONOS PCT: 6.7 % (ref 3.0–12.0)
Monocytes Absolute: 0.6 10*3/uL (ref 0.1–1.0)
NEUTROS ABS: 5.5 10*3/uL (ref 1.4–7.7)
NEUTROS PCT: 62.8 % (ref 43.0–77.0)
PLATELETS: 798 10*3/uL — AB (ref 150.0–400.0)
RBC: 4.37 Mil/uL (ref 3.87–5.11)
RDW: 21.9 % — ABNORMAL HIGH (ref 11.5–15.5)
WBC: 8.7 10*3/uL (ref 4.0–10.5)

## 2017-10-11 LAB — COMPREHENSIVE METABOLIC PANEL
ALT: 13 U/L (ref 0–35)
AST: 21 U/L (ref 0–37)
Albumin: 3.2 g/dL — ABNORMAL LOW (ref 3.5–5.2)
Alkaline Phosphatase: 66 U/L (ref 39–117)
BUN: 6 mg/dL (ref 6–23)
CALCIUM: 8.7 mg/dL (ref 8.4–10.5)
CO2: 30 mEq/L (ref 19–32)
Chloride: 102 mEq/L (ref 96–112)
Creatinine, Ser: 0.72 mg/dL (ref 0.40–1.20)
GFR: 84.52 mL/min (ref >60.00)
GLUCOSE: 87 mg/dL (ref 70–99)
POTASSIUM: 3.7 meq/L (ref 3.5–5.1)
Sodium: 137 mEq/L (ref 135–145)
TOTAL PROTEIN: 5.6 g/dL — AB (ref 6.0–8.3)
Total Bilirubin: 0.2 mg/dL (ref 0.2–1.2)

## 2017-10-11 LAB — CUP PACEART REMOTE DEVICE CHECK
Date Time Interrogation Session: 20181217030956
Implantable Pulse Generator Implant Date: 20151230

## 2017-10-11 MED ORDER — VANCOMYCIN HCL 25 MG/ML PO SOLR: 125.0000 mg | Freq: Four times a day (QID) | ORAL | 0 refills | Status: DC

## 2017-10-11 MED ORDER — FENTANYL 25 MCG/HR TD PT72: 25.0000 ug | MEDICATED_PATCH | TRANSDERMAL | Status: DC

## 2017-10-11 NOTE — Patient Instructions (Signed)
Go to the lab on the way out.  We'll contact you with your lab report. If you have more diarrhea like prev with C diff, then collect a sample, restart vancomycin, and call us.  Take care.  Glad to see you.  Keep the appointment with GI on 10/17/17.

## 2017-10-11 NOTE — Progress Notes (Signed)
Inpatient f/u.  Had abd pain, then next day she was weak, vomiting, to ER via EMS.  Dx'd with dilated common bile duct measuring approximately 9-10 mm but that resolved on repeat imaging.  dx'd with C diff.  Transfused for anemia.  Now done with vanc course for c diff.  No abd pain.  No more mucous in stools.  No fevers, no vomiting. Still with nausea.   She has pending neck injection re: neck and arm pain. She'll hold plavix for that per routine.   They have been contacted by palliative care in the meantime and they are considering extra help in the home.    PMH and SH reviewed  ROS: Per HPI unless specifically indicated in ROS section   Meds, vitals, and allergies reviewed.   GEN: nad, alert and pleasant but she doesn't recall all of the details about her care.   HEENT: mucous membranes moist NECK: supple w/o LA CV: rrr.  PULM: ctab, no inc wob ABD: soft, +bs, not ttp EXT: no edema SKIN: no acute rash

## 2017-10-12 ENCOUNTER — Encounter: Payer: Self-pay | Admitting: Family Medicine

## 2017-10-12 NOTE — Assessment & Plan Note (Signed)
Dx'd with dilated common bile duct measuring approximately 9-10 mm but that resolved on repeat imaging.  Dx'd with C diff, now done with vanc course.  D/w pt and husband.  Reasonable to recheck routine labs today, esp re: anemia.  I am unsure how much blood loss she had from GI source with C diff colitis flare.  See notes on labs, recheck HGB.    If more diarrhea like prev with C diff, then collect a sample, restart vancomycin, and call us. She'll keep the appointment with GI on 10/17/17.  At this point, okay for outpatient f/u.   >25 minutes spent in face to face time with patient, >50% spent in counselling or coordination of care.

## 2017-10-13 ENCOUNTER — Other Ambulatory Visit: Payer: Self-pay | Admitting: Family Medicine

## 2017-10-14 ENCOUNTER — Other Ambulatory Visit: Payer: Self-pay | Admitting: Family Medicine

## 2017-10-17 ENCOUNTER — Encounter: Payer: Self-pay | Admitting: Physician Assistant

## 2017-10-17 ENCOUNTER — Ambulatory Visit (INDEPENDENT_AMBULATORY_CARE_PROVIDER_SITE_OTHER): Payer: Medicare Other | Admitting: Physician Assistant

## 2017-10-17 VITALS — BP 90/64 | HR 102 | Ht 65.35 in | Wt 127.4 lb

## 2017-10-17 DIAGNOSIS — Z8619 Personal history of other infectious and parasitic diseases: Secondary | ICD-10-CM

## 2017-10-17 DIAGNOSIS — R634 Abnormal weight loss: Secondary | ICD-10-CM | POA: Diagnosis not present

## 2017-10-17 DIAGNOSIS — D508 Other iron deficiency anemias: Secondary | ICD-10-CM | POA: Diagnosis not present

## 2017-10-17 DIAGNOSIS — R110 Nausea: Secondary | ICD-10-CM

## 2017-10-17 DIAGNOSIS — M47812 Spondylosis without myelopathy or radiculopathy, cervical region: Secondary | ICD-10-CM | POA: Diagnosis not present

## 2017-10-17 MED ORDER — PANTOPRAZOLE SODIUM 40 MG PO TBEC: 40.0000 mg | DELAYED_RELEASE_TABLET | Freq: Every day | ORAL | 3 refills | Status: AC

## 2017-10-17 NOTE — Progress Notes (Signed)
Chief Complaint: Nausea, weight loss, follow-up from hospital visit and C. difficile  HPI:    Aimee Jones is a 72 year old Caucasian female, who is a patient of Dr. Ardis Hughs, with a past medical history of Alzheimer's dementia as well as others listed below, who presents to clinic today for follow-up after recently being seen in the hospital with suspicion of choledocholithiasis and diagnosis of C. difficile as well as leukocytosis and anemia.    Patient's last EGD was 08/05/15 and was normal.  Colonoscopy on the same day revealed 6-7 small sessile polyps.  These were not removed given her overall clinical situation with multiple comorbidities.    Per review of chart patient was seen in the hospital, [facility], by Dr. Alice Reichert on 09/25/17 for abdominal pain and vomiting as well as decreased oral intake for a few months.  Patient had a leukocytosis of 26,000 and normal LFTs.  Abdominal CT scan did show a dilated common bile duct around 9-10 mm.  She was started on IV antibiotics leukocytosis was contributed to possible "cholangitis", but it was thought this could also be related to PUD.  Patient was found to be C. difficile positive and started on p.o. vancomycin.  A repeat ultrasound of the right upper quadrant was ordered which showed no choledocholithiasis. MRCP was also unrevealing.  Her leukocytosis improved and was thought related to C. difficile.  It was thought patient may require an endoscopy as an outpatient due to her anemia at that time.    Patient had repeat labs 10/11/17 with improvement in hemoglobin to 11.4 from 8.7 previously 2 weeks prior, white count was normal.  CMP was normal other than a decreased albumin his total protein of 5.6.  Last imaging was done 09/27/17 MR of the abdomen MRCP with and without contrast which showed multiple small hepatic cysts no evidence of hepatic mass or biliary ductal dilatation as well as hemosiderosis and tiny bilateral pleural effusions.    Today, the patient  presents to clinic accompanied by her husband who is her caretaker and they are able to tell me that she feels much better.  Her only complaint today is of bilateral shoulder pain.  Apparently, she is going for an appointment and some "injections" later this afternoon.  Overall, the patient feels well other than some nausea for which her husband is giving her Zofran as needed.  He does tell me that she has had some weight loss and a decrease in appetite associated with her shoulder pain and nausea, but he is trying to supplement occasionally with boost or Ensure shakes.  Overall, the patient is feeling much better with no abdominal pain or further diarrhea.  Patient is currently using Pantoprazole 20 mg daily.  Her husband has stopped her aspirin due to fear of gastritis.     Patient denies fever, chills, blood in her stool, melena, vomiting, heartburn, reflux or symptoms that awaken her at night.  Past Medical History:  Diagnosis Date  . Allergy   . Anxiety    panic attacks- on occas.   . Arthritis    osteoporosis - especially hips.   . Asthma   . Confusion   . Dementia   . Depression   . Diverticulosis   . ETOH abuse   . Falls   . GERD (gastroesophageal reflux disease)   . Hyperlipidemia   . Hypertension   . Hyponatremia 07/05/2015  . Memory loss   . Normal 24 hour ambulatory pH monitoring study    good  acid suppression  . Observed seizure-like activity ([place]) 07/05/2015   observed by husband , Dr Doy Mince  . Seizures ([place])   . Shortness of breath   . Stroke [facility])     Past Surgical History:  Procedure Laterality Date  . ABDOMINAL HYSTERECTOMY    . BREAST SURGERY     3x- biposies   . COLONOSCOPY WITH PROPOFOL N/A 08/05/2015   Procedure: COLONOSCOPY WITH PROPOFOL;  Surgeon: [name], MD;  Location: [facility] ENDOSCOPY;  Service: Endoscopy;  Laterality: N/A;  . DOBUTAMINE STRESS ECHO  07/09/2001   normal  . ESOPHAGOGASTRODUODENOSCOPY  06/11/2006   normal  .  ESOPHAGOGASTRODUODENOSCOPY (EGD) WITH PROPOFOL N/A 08/05/2015   Procedure: ESOPHAGOGASTRODUODENOSCOPY (EGD) WITH PROPOFOL;  Surgeon: [name], MD;  Location: [facility] ENDOSCOPY;  Service: Endoscopy;  Laterality: N/A;  . EYE SURGERY     cataracts removed, IOL- both eyes   . JOINT REPLACEMENT     bilateral hips  . LOOP RECORDER IMPLANT N/A 10/14/2014   Procedure: LOOP RECORDER IMPLANT;  Surgeon: Evans Lance, MD;  Location: [facility] C[facility] LAB;  Service: Cardiovascular;  Laterality: N/A;  . NASAL SINUS SURGERY  1990  . REVERSE SHOULDER ARTHROPLASTY Right 08/13/2014   Procedure: REVERSE SHOULDER ARTHROPLASTY;  Surgeon: Marin Shutter, MD;  Location: [place];  Service: Orthopedics;  Laterality: Right;  interscalene block  . SHOULDER ARTHROSCOPY Left   . TEE WITHOUT CARDIOVERSION N/A 10/14/2014   Procedure: TRANSESOPHAGEAL ECHOCARDIOGRAM (TEE);  Surgeon: Sueanne Margarita, MD;  Location: [place];  Service: Cardiovascular;  Laterality: N/A;  . TOTAL HIP ARTHROPLASTY     left 1991/right 2008  . ULNAR NERVE TRANSPOSITION Right 08/01/2016   Procedure: RIGHT ULNAR NEUROPLASTY AT THE ELBOW;  Surgeon: Milly Jakob, MD;  Location: [place];  Service: Orthopedics;  Laterality: Right;    Current Outpatient Medications  Medication Sig Dispense Refill  . acetaminophen (TYLENOL) 325 MG tablet Take 2 tablets (650 mg total) by mouth 3 (three) times daily as needed.    Marland Kitchen amLODipine (NORVASC) 2.5 MG tablet TAKE 1 TABLET BY MOUTH EVERY DAY 30 tablet 5  . atorvastatin (LIPITOR) 10 MG tablet TAKE 1 TABLET BY MOUTH EVERY DAY 90 tablet 0  . citalopram (CELEXA) 20 MG tablet TAKE 1 TABLET BY MOUTH EVERY DAY 90 tablet 0  . clopidogrel (PLAVIX) 75 MG tablet TAKE 1 TABLET (75 MG TOTAL) BY MOUTH DAILY.    Marland Kitchen clopidogrel (PLAVIX) 75 MG tablet TAKE 1 TABLET BY MOUTH EVERY DAY 90 tablet 1  . donepezil (ARICEPT) 10 MG tablet TAKE 1 TABLET BY MOUTH EVERYDAY AT BEDTIME 90 tablet 1  . doxepin (SINEQUAN) 25 MG  capsule TAKE 1 CAPSULE BY MOUTH DAILY AT BEDTIME 90 capsule 1  . fentaNYL (DURAGESIC - DOSED MCG/HR) 25 MCG/HR patch Place 1 patch (25 mcg total) onto the skin every 3 (three) days.    . fluticasone (FLONASE) 50 MCG/ACT nasal spray Place into the nose.    . hydrocortisone 2.5 % cream APPLY RECTALLY 2 TIMES DAILY 28.35 g 0  . ibuprofen (MOTRIN IB) 200 MG tablet Take 2 tablets (400 mg total) by mouth 3 (three) times daily as needed (with food).    . Multiple Vitamins-Minerals (EYE VITAMINS) CAPS Take 1 capsule by mouth daily.    . Omega-3 Fatty Acids (FISH OIL PO) Take by mouth.    . ondansetron (ZOFRAN) 8 MG tablet Take 8 mg by mouth every 8 (eight) hours as needed.     Marland Kitchen oxyCODONE-acetaminophen (  PERCOCET) 10-325 MG tablet Take 1 tablet by mouth 3 (three) times daily.    . pantoprazole (PROTONIX) 20 MG tablet TAKE 1 TABLET BY MOUTH EVERY DAY 90 tablet 1  . polyvinyl alcohol (LIQUIFILM TEARS) 1.4 % ophthalmic solution Place 1 drop into both eyes as needed.     . SYMBICORT 160-4.5 MCG/ACT inhaler INHALE 2 PUFFS INTO THE LUNGS 2 (TWO) TIMES DAILY AS NEEDED (SHORTNESS OF BREATH). 10.2 Inhaler 7  . Vancomycin HCl 25 MG/ML SOLR Take 125 mg by mouth 4 (four) times daily. 200 mL 0  . vitamin E 400 UNIT capsule Take by mouth.    . Vitamins/Minerals TABS Take by mouth.     No current facility-administered medications for this visit.     Allergies as of 10/17/2017 - Review Complete 10/11/2017  Allergen Reaction Noted  . Tramadol Other (See Comments) 09/07/2015  . Latex Rash 07/25/2007  . Penicillins Rash 07/25/2007    Family History  Problem Relation Age of Onset  . Heart block Mother   . Stroke Mother   . Heart attack Father   . Sudden death Brother   . Heart attack Brother   . Hypertension Brother   . Esophageal cancer Brother   . Stroke Brother     Social History   Socioeconomic History  . Marital status: Married    Spouse name: Not on file  . Number of children: Not on file  .  Years of education: Not on file  . Highest education level: Not on file  Social Needs  . Financial resource strain: Not on file  . Food insecurity - worry: Not on file  . Food insecurity - inability: Not on file  . Transportation needs - medical: Not on file  . Transportation needs - non-medical: Not on file  Occupational History  . Occupation: retired    Fish farm manager: RETIRED    Comment: [employer]  Tobacco Use  . Smoking status: Never Smoker  . Smokeless tobacco: Never Used  Substance and Sexual Activity  . Alcohol use: No    Alcohol/week: 0.0 oz  . Drug use: No  . Sexual activity: Yes  Other Topics Concern  . Not on file  Social History Narrative   Married. Retired.      Review of Systems:    Constitutional: No fever or chills Cardiovascular: No chest pain Respiratory: No SOB  Gastrointestinal: See HPI and otherwise negative   Physical Exam:  Vital signs: BP 90/64   Pulse (!) 102   Ht 5' 5.35" (1.66 m)   Wt 127 lb 6 oz (57.8 kg)   BMI 20.97 kg/m    Constitutional:   Pleasant Pale, Frail Elderly Caucasian female appears to be in NAD, Well developed, alert and cooperative Respiratory: Respirations even and unlabored. Lungs clear to auscultation bilaterally.   No wheezes, crackles, or rhonchi.  Cardiovascular: Normal S1, S2. No MRG. Regular rate and rhythm. No peripheral edema, cyanosis or pallor.  Gastrointestinal:  Soft, nondistended, Mild epigastric ttp. No rebound or guarding. Normal bowel sounds. No appreciable masses or hepatomegaly. Psychiatric: Demonstrates good judgement and reason without abnormal affect or behaviors.  RELEVANT LABS AND IMAGING: CBC    Component Value Date/Time   WBC 8.7 10/11/2017 1325   RBC 4.37 10/11/2017 1325   HGB 11.4 (L) 10/11/2017 1325   HCT 36.9 10/11/2017 1325   PLT 798.0 (H) 10/11/2017 1325   MCV 84.6 10/11/2017 1325   MCH 25.9 (L) 09/27/2017 0332   MCHC 30.8 10/11/2017 1325  RDW 21.9 (H) 10/11/2017 1325   LYMPHSABS 2.1  10/11/2017 1325   MONOABS 0.6 10/11/2017 1325   EOSABS 0.4 10/11/2017 1325   BASOSABS 0.2 (H) 10/11/2017 1325    CMP     Component Value Date/Time   NA 137 10/11/2017 1325   K 3.7 10/11/2017 1325   CL 102 10/11/2017 1325   CO2 30 10/11/2017 1325   GLUCOSE 87 10/11/2017 1325   BUN 6 10/11/2017 1325   CREATININE 0.72 10/11/2017 1325   CREATININE 1.18 (H) 05/22/2014 1211   CALCIUM 8.7 10/11/2017 1325   PROT 5.6 (L) 10/11/2017 1325   ALBUMIN 3.2 (L) 10/11/2017 1325   AST 21 10/11/2017 1325   ALT 13 10/11/2017 1325   ALKPHOS 66 10/11/2017 1325   BILITOT 0.2 10/11/2017 1325   GFRNONAA >60 09/27/2017 0332   GFRAA >60 09/27/2017 0332   CLINICAL DATA:  Elevated liver function tests. Thrombocytosis. Biliary ductal dilatation on recent CT.  EXAM: MRI ABDOMEN WITHOUT AND WITH CONTRAST (INCLUDING MRCP) 09/27/17  TECHNIQUE: Multiplanar multisequence MR imaging of the abdomen was performed both before and after the administration of intravenous contrast. Heavily T2-weighted images of the biliary and pancreatic ducts were obtained, and three-dimensional MRCP images were rendered by post processing.  CONTRAST:  35mL MULTIHANCE GADOBENATE DIMEGLUMINE 529 MG/ML IV SOLN  COMPARISON:  CT on 09/24/2017  FINDINGS: Lower chest: Tiny bilateral pleural effusions.  Hepatobiliary: Image degradation by motion artifact noted. No hepatic masses identified. Multiple small cysts are seen throughout the right and left hepatic lobes, largest in lateral segment of left lobe measuring 2.2 cm. Diffuse parenchymal T2 hypointensity, consistent with hemosiderosis. Gallbladder is unremarkable. No evidence of biliary duct dilatation, with common bile duct measuring 6 mm. No evidence of choledocholithiasis.  Pancreas: No mass or inflammatory changes. Normal signal intensity.  Spleen: Normal size. No splenic masses are identified. Diffuse T2 hypointensity, consistent with  hemosiderosis.  Adrenals/Urinary Tract: No masses identified. No evidence of hydronephrosis.  Stomach/Bowel: Visualized portions within abdomen are unremarkable.  Vascular/Lymphatic: No pathologically enlarged lymph nodes identified. No abdominal aortic aneurysm.  Other:  None.  Musculoskeletal: No suspicious bone lesions identified. Diffuse bone marrow T2 hypointensity, consistent with iron overload.  IMPRESSION: Multiple small hepatic cysts. No evidence of hepatic mass or biliary ductal dilatation.  Hemosiderosis.  Tiny bilateral pleural effusions.   Electronically Signed   By: Earle Gell M.D.   On: 09/27/2017 14:10  Assessment: 1.  History of C. difficile: diagnosed in the hospital, pt finished Vancomycin and has no further symptoms 2.  Nausea: constant per pt husband, some better with prn Zofran and d/c of ASA 3.  Weight loss: around 20 # over the past year, consider relation to Alzheimers and decreased intake vs gastritis 4.  Anemia: Improved to 11.4 currently from 8.9 previously, consider relation to recent C. Difficile, at this time no further intervention needed, as this seems to be improving.  Plan: 1.  Discussed with the patient and her husband that she is feeling generally well after her hospitalization and episode of C. difficile.  Her only GI complaint today is of nausea and some weight loss with decrease in appetite. 2.  Recommend they start supplementing Boost/Ensure shakes 3 times a day. 3.  Will increase patient's Pantoprazole to 40 mg once daily, 30-60 minutes before breakfast #90 with 3 refills 4.  Would recommend her husband schedule her Zofran 8 mg every 6 hours. 5.  Patient to follow in clinic with Dr. Ardis Hughs or myself as needed  in the future.  Aimee Newer, PA-C [facility] 10/17/2017, 9:39 AM  Cc: Tonia Ghent, MD

## 2017-10-17 NOTE — Patient Instructions (Signed)
Increase pantoprazole to 40 mg daily 30-60 minutes before breakfast.   Schedule your Zofran every 6 hours for nausea.   Start a supplement with Boost or Ensure shakes.

## 2017-10-21 ENCOUNTER — Other Ambulatory Visit: Payer: Self-pay | Admitting: Family Medicine

## 2017-10-21 NOTE — Progress Notes (Signed)
I agree with the above note, plan 

## 2017-10-23 ENCOUNTER — Other Ambulatory Visit: Payer: Self-pay | Admitting: Family Medicine

## 2017-10-23 NOTE — Telephone Encounter (Signed)
Electronic refill request. Doxepin Last office visit:   12/12/16 Last Filled:    90 capsule 1 05/15/2017  Please advise.

## 2017-10-24 NOTE — Telephone Encounter (Signed)
Sent. Thanks.   

## 2017-10-30 ENCOUNTER — Other Ambulatory Visit: Payer: Self-pay | Admitting: Gastroenterology

## 2017-10-30 ENCOUNTER — Ambulatory Visit (INDEPENDENT_AMBULATORY_CARE_PROVIDER_SITE_OTHER): Payer: Medicare Other | Admitting: *Deleted

## 2017-10-30 DIAGNOSIS — I639 Cerebral infarction, unspecified: Secondary | ICD-10-CM | POA: Diagnosis not present

## 2017-10-31 DIAGNOSIS — G894 Chronic pain syndrome: Secondary | ICD-10-CM | POA: Diagnosis not present

## 2017-10-31 DIAGNOSIS — M47812 Spondylosis without myelopathy or radiculopathy, cervical region: Secondary | ICD-10-CM | POA: Diagnosis not present

## 2017-10-31 NOTE — Progress Notes (Signed)
[facility] Summary Report / Loop Recorder 

## 2017-11-01 DIAGNOSIS — R531 Weakness: Secondary | ICD-10-CM | POA: Diagnosis not present

## 2017-11-09 ENCOUNTER — Ambulatory Visit (INDEPENDENT_AMBULATORY_CARE_PROVIDER_SITE_OTHER): Payer: Medicare Other | Admitting: Podiatry

## 2017-11-09 DIAGNOSIS — B351 Tinea unguium: Secondary | ICD-10-CM

## 2017-11-09 DIAGNOSIS — M79676 Pain in unspecified toe(s): Secondary | ICD-10-CM

## 2017-11-09 LAB — CUP PACEART REMOTE DEVICE CHECK
Implantable Pulse Generator Implant Date: 20151230
MDC IDC SESS DTM: 20190116034130

## 2017-11-11 NOTE — Progress Notes (Signed)
   SUBJECTIVE Patient presents to office today complaining of elongated, thickened nails. Pain while ambulating in shoes. Patient is unable to trim their own nails.   Past Medical History:  Diagnosis Date  . Allergy   . Anxiety    panic attacks- on occas.   . Arthritis    osteoporosis - especially hips.   . Asthma   . Confusion   . Dementia   . Depression   . Diverticulosis   . ETOH abuse   . Falls   . GERD (gastroesophageal reflux disease)   . Hyperlipidemia   . Hypertension   . Hyponatremia 07/05/2015  . Memory loss   . Normal 24 hour ambulatory pH monitoring study    good acid suppression  . Observed seizure-like activity ([place]) 07/05/2015   observed by husband , Dr Doy Mince  . Seizures ([place])   . Shortness of breath   . Stroke [facility])     OBJECTIVE General Patient is awake, alert, and oriented x 3 and in no acute distress. Derm Skin is dry and supple bilateral. Negative open lesions or macerations. Remaining integument unremarkable. Nails are tender, long, thickened and dystrophic with subungual debris, consistent with onychomycosis, 1-5 bilateral. No signs of infection noted. Vasc  DP and PT pedal pulses palpable bilaterally. Temperature gradient within normal limits.  Neuro Epicritic and protective threshold sensation diminished bilaterally.  Musculoskeletal Exam No symptomatic pedal deformities noted bilateral. Muscular strength within normal limits.  ASSESSMENT 1. Onychodystrophic nails 1-5 bilateral with hyperkeratosis of nails.  2. Onychomycosis of nail due to dermatophyte bilateral 3. Pain in foot bilateral  PLAN OF CARE 1. Patient evaluated today.  2. Instructed to maintain good pedal hygiene and foot care.  3. Mechanical debridement of nails 1-5 bilaterally performed using a nail nipper. Filed with dremel without incident.  4. Return to clinic in 3 mos.    Edrick Kins, DPM [facility]  Dr. Edrick Kins, [place]                                         [place], [place] [address]                Office [phone]  Fax [phone]

## 2017-11-28 DIAGNOSIS — Z79891 Long term (current) use of opiate analgesic: Secondary | ICD-10-CM | POA: Diagnosis not present

## 2017-11-28 DIAGNOSIS — G894 Chronic pain syndrome: Secondary | ICD-10-CM | POA: Diagnosis not present

## 2017-11-28 DIAGNOSIS — M792 Neuralgia and neuritis, unspecified: Secondary | ICD-10-CM | POA: Diagnosis not present

## 2017-11-28 DIAGNOSIS — M25519 Pain in unspecified shoulder: Secondary | ICD-10-CM | POA: Diagnosis not present

## 2017-11-28 DIAGNOSIS — M47812 Spondylosis without myelopathy or radiculopathy, cervical region: Secondary | ICD-10-CM | POA: Diagnosis not present

## 2017-11-29 ENCOUNTER — Encounter: Payer: Medicare Other | Admitting: *Deleted

## 2017-11-30 ENCOUNTER — Telehealth: Payer: Self-pay | Admitting: *Deleted

## 2017-11-30 NOTE — Telephone Encounter (Signed)
Spoke with patient's husband, Gwyndolyn Saxon ([place]).  Advised of LINQ at [facility] and patient's husband reports she would like an appointment with Dr. Lovena Le to discuss explant.  Advised I will forward this message to our scheduler for assistance scheduling appointment.  Patient's husband is appreciative and is agreeable to receiving a return kit for the [facility] monitor.  He is aware to unplug monitor in the interim.  He denies any additional questions or concerns at this time.

## 2017-12-12 ENCOUNTER — Encounter: Payer: Self-pay | Admitting: Internal Medicine

## 2017-12-13 ENCOUNTER — Ambulatory Visit (INDEPENDENT_AMBULATORY_CARE_PROVIDER_SITE_OTHER): Payer: Medicare Other | Admitting: Family Medicine

## 2017-12-13 ENCOUNTER — Encounter: Payer: Self-pay | Admitting: Family Medicine

## 2017-12-13 ENCOUNTER — Encounter: Payer: Medicare Other | Admitting: Internal Medicine

## 2017-12-13 VITALS — BP 122/70 | HR 72 | Temp 98.2°F | Wt 134.5 lb

## 2017-12-13 DIAGNOSIS — I639 Cerebral infarction, unspecified: Secondary | ICD-10-CM

## 2017-12-13 DIAGNOSIS — M542 Cervicalgia: Secondary | ICD-10-CM

## 2017-12-13 DIAGNOSIS — R238 Other skin changes: Secondary | ICD-10-CM | POA: Diagnosis not present

## 2017-12-13 DIAGNOSIS — G8929 Other chronic pain: Secondary | ICD-10-CM | POA: Diagnosis not present

## 2017-12-13 NOTE — Progress Notes (Signed)
Rash started last week.  Uses mult cosmetics and a horseshoe pillow.  She had local skin peeling with irritation under the peeled skin.  It burned more than itched.  Used lidocaine spray and that irritated it more.  On B face, neck, and upper chest.  B symmetric rash.    She has used [place] as a bowel regimen per pain clinic recs.  Had BM this AM.   Per report, pain clinic asked the patient/husband to get eval by ortho.  D/w pt.  We can refer, ordered.  I am not managing her neck and shoulder pain.   Meds, vitals, and allergies reviewed.   ROS: Per HPI unless specifically indicated in ROS section   nad B facial and neck rash w/o oral changes.  Looks like a sunburn with flaking but no spreading or infectious appearing erythema. No abscess. rrr ctab

## 2017-12-13 NOTE — Patient Instructions (Signed)
Don't use any meds or creams on the affected area except for OTC plain 1% hydrocortisone twice a day.  Let me know if this isn't healing over or if you have a fever.  Limit washing of the area- rinse with soapy water and then with clean water.  Don't scrub.  Rosaria Ferries will call about your referral. Take care.  Glad to see you.

## 2017-12-14 ENCOUNTER — Encounter: Payer: Self-pay | Admitting: Internal Medicine

## 2017-12-15 DIAGNOSIS — M542 Cervicalgia: Secondary | ICD-10-CM | POA: Diagnosis not present

## 2017-12-16 DIAGNOSIS — R238 Other skin changes: Secondary | ICD-10-CM | POA: Insufficient documentation

## 2017-12-16 DIAGNOSIS — G8929 Other chronic pain: Secondary | ICD-10-CM | POA: Insufficient documentation

## 2017-12-16 DIAGNOSIS — M542 Cervicalgia: Principal | ICD-10-CM

## 2017-12-16 NOTE — Assessment & Plan Note (Signed)
Refer.  I appreciate the help of outside clinic.

## 2017-12-16 NOTE — Assessment & Plan Note (Signed)
If the areas do not look infected.  She has baseline memory loss.  I think she likely applied, then over-applied, some of her over-the-counter topical creams.  Her husband will confiscate all of the cosmetics and creams.  Don't use any meds or creams on the affected area except for OTC plain 1% hydrocortisone twice a day.  They can let me know if this isn't healing over or if patient has a fever.  Limit washing of the area- rinse with soapy water and then with clean water.  Don't scrub.  Okay for outpatient follow-up.

## 2017-12-18 DIAGNOSIS — G894 Chronic pain syndrome: Secondary | ICD-10-CM | POA: Diagnosis not present

## 2017-12-18 DIAGNOSIS — Z79891 Long term (current) use of opiate analgesic: Secondary | ICD-10-CM | POA: Diagnosis not present

## 2017-12-18 DIAGNOSIS — Z79899 Other long term (current) drug therapy: Secondary | ICD-10-CM | POA: Diagnosis not present

## 2017-12-18 DIAGNOSIS — M47812 Spondylosis without myelopathy or radiculopathy, cervical region: Secondary | ICD-10-CM | POA: Diagnosis not present

## 2017-12-18 DIAGNOSIS — M25519 Pain in unspecified shoulder: Secondary | ICD-10-CM | POA: Diagnosis not present

## 2017-12-18 DIAGNOSIS — M792 Neuralgia and neuritis, unspecified: Secondary | ICD-10-CM | POA: Diagnosis not present

## 2017-12-20 ENCOUNTER — Other Ambulatory Visit: Payer: Self-pay | Admitting: Internal Medicine

## 2017-12-26 DIAGNOSIS — M792 Neuralgia and neuritis, unspecified: Secondary | ICD-10-CM | POA: Diagnosis not present

## 2017-12-26 DIAGNOSIS — G894 Chronic pain syndrome: Secondary | ICD-10-CM | POA: Diagnosis not present

## 2017-12-26 DIAGNOSIS — M19019 Primary osteoarthritis, unspecified shoulder: Secondary | ICD-10-CM | POA: Diagnosis not present

## 2017-12-26 DIAGNOSIS — M47812 Spondylosis without myelopathy or radiculopathy, cervical region: Secondary | ICD-10-CM | POA: Diagnosis not present

## 2018-01-16 ENCOUNTER — Other Ambulatory Visit: Payer: Self-pay | Admitting: Family Medicine

## 2018-01-17 DIAGNOSIS — R531 Weakness: Secondary | ICD-10-CM | POA: Diagnosis not present

## 2018-01-23 DIAGNOSIS — Z79899 Other long term (current) drug therapy: Secondary | ICD-10-CM | POA: Diagnosis not present

## 2018-01-23 DIAGNOSIS — Z79891 Long term (current) use of opiate analgesic: Secondary | ICD-10-CM | POA: Diagnosis not present

## 2018-01-23 DIAGNOSIS — M47812 Spondylosis without myelopathy or radiculopathy, cervical region: Secondary | ICD-10-CM | POA: Diagnosis not present

## 2018-01-23 DIAGNOSIS — M19019 Primary osteoarthritis, unspecified shoulder: Secondary | ICD-10-CM | POA: Diagnosis not present

## 2018-01-23 DIAGNOSIS — G5600 Carpal tunnel syndrome, unspecified upper limb: Secondary | ICD-10-CM | POA: Diagnosis not present

## 2018-01-23 DIAGNOSIS — G894 Chronic pain syndrome: Secondary | ICD-10-CM | POA: Diagnosis not present

## 2018-01-28 ENCOUNTER — Other Ambulatory Visit: Payer: Self-pay

## 2018-01-28 ENCOUNTER — Encounter: Payer: Self-pay | Admitting: Emergency Medicine

## 2018-01-28 DIAGNOSIS — Z8673 Personal history of transient ischemic attack (TIA), and cerebral infarction without residual deficits: Secondary | ICD-10-CM

## 2018-01-28 DIAGNOSIS — E43 Unspecified severe protein-calorie malnutrition: Secondary | ICD-10-CM | POA: Diagnosis not present

## 2018-01-28 DIAGNOSIS — K567 Ileus, unspecified: Secondary | ICD-10-CM | POA: Diagnosis present

## 2018-01-28 DIAGNOSIS — F0280 Dementia in other diseases classified elsewhere without behavioral disturbance: Secondary | ICD-10-CM | POA: Diagnosis not present

## 2018-01-28 DIAGNOSIS — Z885 Allergy status to narcotic agent status: Secondary | ICD-10-CM

## 2018-01-28 DIAGNOSIS — R911 Solitary pulmonary nodule: Secondary | ICD-10-CM | POA: Diagnosis present

## 2018-01-28 DIAGNOSIS — Z66 Do not resuscitate: Secondary | ICD-10-CM | POA: Diagnosis present

## 2018-01-28 DIAGNOSIS — R260 Ataxic gait: Secondary | ICD-10-CM | POA: Diagnosis present

## 2018-01-28 DIAGNOSIS — M1990 Unspecified osteoarthritis, unspecified site: Secondary | ICD-10-CM | POA: Diagnosis present

## 2018-01-28 DIAGNOSIS — Z7951 Long term (current) use of inhaled steroids: Secondary | ICD-10-CM

## 2018-01-28 DIAGNOSIS — D72829 Elevated white blood cell count, unspecified: Secondary | ICD-10-CM | POA: Diagnosis present

## 2018-01-28 DIAGNOSIS — Z8659 Personal history of other mental and behavioral disorders: Secondary | ICD-10-CM

## 2018-01-28 DIAGNOSIS — Z8719 Personal history of other diseases of the digestive system: Secondary | ICD-10-CM

## 2018-01-28 DIAGNOSIS — Z6820 Body mass index (BMI) 20.0-20.9, adult: Secondary | ICD-10-CM

## 2018-01-28 DIAGNOSIS — Z79899 Other long term (current) drug therapy: Secondary | ICD-10-CM

## 2018-01-28 DIAGNOSIS — K219 Gastro-esophageal reflux disease without esophagitis: Secondary | ICD-10-CM | POA: Diagnosis present

## 2018-01-28 DIAGNOSIS — R531 Weakness: Secondary | ICD-10-CM | POA: Diagnosis present

## 2018-01-28 DIAGNOSIS — F419 Anxiety disorder, unspecified: Secondary | ICD-10-CM | POA: Diagnosis present

## 2018-01-28 DIAGNOSIS — I10 Essential (primary) hypertension: Secondary | ICD-10-CM | POA: Diagnosis present

## 2018-01-28 DIAGNOSIS — R112 Nausea with vomiting, unspecified: Secondary | ICD-10-CM | POA: Diagnosis not present

## 2018-01-28 DIAGNOSIS — K5900 Constipation, unspecified: Secondary | ICD-10-CM | POA: Diagnosis not present

## 2018-01-28 DIAGNOSIS — G309 Alzheimer's disease, unspecified: Secondary | ICD-10-CM | POA: Diagnosis present

## 2018-01-28 DIAGNOSIS — R51 Headache: Secondary | ICD-10-CM | POA: Diagnosis present

## 2018-01-28 DIAGNOSIS — M549 Dorsalgia, unspecified: Secondary | ICD-10-CM | POA: Diagnosis present

## 2018-01-28 DIAGNOSIS — Z95818 Presence of other cardiac implants and grafts: Secondary | ICD-10-CM

## 2018-01-28 DIAGNOSIS — K5641 Fecal impaction: Secondary | ICD-10-CM | POA: Diagnosis present

## 2018-01-28 DIAGNOSIS — Z96643 Presence of artificial hip joint, bilateral: Secondary | ICD-10-CM | POA: Diagnosis present

## 2018-01-28 DIAGNOSIS — Z862 Personal history of diseases of the blood and blood-forming organs and certain disorders involving the immune mechanism: Secondary | ICD-10-CM

## 2018-01-28 DIAGNOSIS — E871 Hypo-osmolality and hyponatremia: Secondary | ICD-10-CM | POA: Diagnosis not present

## 2018-01-28 DIAGNOSIS — Z7902 Long term (current) use of antithrombotics/antiplatelets: Secondary | ICD-10-CM

## 2018-01-28 DIAGNOSIS — Z9181 History of falling: Secondary | ICD-10-CM

## 2018-01-28 DIAGNOSIS — Z96611 Presence of right artificial shoulder joint: Secondary | ICD-10-CM | POA: Diagnosis present

## 2018-01-28 DIAGNOSIS — R197 Diarrhea, unspecified: Secondary | ICD-10-CM | POA: Diagnosis not present

## 2018-01-28 DIAGNOSIS — D473 Essential (hemorrhagic) thrombocythemia: Secondary | ICD-10-CM | POA: Diagnosis present

## 2018-01-28 DIAGNOSIS — Z880 Allergy status to penicillin: Secondary | ICD-10-CM

## 2018-01-28 DIAGNOSIS — Z79891 Long term (current) use of opiate analgesic: Secondary | ICD-10-CM

## 2018-01-28 DIAGNOSIS — E785 Hyperlipidemia, unspecified: Secondary | ICD-10-CM | POA: Diagnosis present

## 2018-01-28 DIAGNOSIS — M4722 Other spondylosis with radiculopathy, cervical region: Secondary | ICD-10-CM | POA: Diagnosis present

## 2018-01-28 DIAGNOSIS — R29700 NIHSS score 0: Secondary | ICD-10-CM | POA: Diagnosis present

## 2018-01-28 DIAGNOSIS — F329 Major depressive disorder, single episode, unspecified: Secondary | ICD-10-CM | POA: Diagnosis present

## 2018-01-28 DIAGNOSIS — E876 Hypokalemia: Secondary | ICD-10-CM | POA: Diagnosis present

## 2018-01-28 DIAGNOSIS — M810 Age-related osteoporosis without current pathological fracture: Secondary | ICD-10-CM | POA: Diagnosis present

## 2018-01-28 DIAGNOSIS — G4700 Insomnia, unspecified: Secondary | ICD-10-CM | POA: Diagnosis present

## 2018-01-28 DIAGNOSIS — E222 Syndrome of inappropriate secretion of antidiuretic hormone: Secondary | ICD-10-CM | POA: Diagnosis not present

## 2018-01-28 DIAGNOSIS — D509 Iron deficiency anemia, unspecified: Secondary | ICD-10-CM | POA: Diagnosis present

## 2018-01-28 DIAGNOSIS — Z515 Encounter for palliative care: Secondary | ICD-10-CM | POA: Diagnosis present

## 2018-01-28 DIAGNOSIS — R1110 Vomiting, unspecified: Secondary | ICD-10-CM | POA: Diagnosis not present

## 2018-01-28 DIAGNOSIS — G894 Chronic pain syndrome: Secondary | ICD-10-CM | POA: Diagnosis present

## 2018-01-28 DIAGNOSIS — Z91040 Latex allergy status: Secondary | ICD-10-CM

## 2018-01-28 DIAGNOSIS — J45909 Unspecified asthma, uncomplicated: Secondary | ICD-10-CM | POA: Diagnosis present

## 2018-01-28 LAB — CBC
HCT: 28.8 % — ABNORMAL LOW (ref 35.0–47.0)
HEMOGLOBIN: 10.5 g/dL — AB (ref 12.0–16.0)
MCH: 25.6 pg — AB (ref 26.0–34.0)
MCHC: 36.5 g/dL — ABNORMAL HIGH (ref 32.0–36.0)
MCV: 70.3 fL — AB (ref 80.0–100.0)
Platelets: 794 10*3/uL — ABNORMAL HIGH (ref 150–440)
RBC: 4.1 MIL/uL (ref 3.80–5.20)
RDW: 16.4 % — ABNORMAL HIGH (ref 11.5–14.5)
WBC: 15.7 10*3/uL — ABNORMAL HIGH (ref 3.6–11.0)

## 2018-01-28 NOTE — ED Triage Notes (Addendum)
Pt to triage via w/c with no distress noted; pt reports N/V/D since yesterday; husband denies that pt has had diarrhea, stating that pt has alzheimers and has not reported diarrhea or c/o pain; husb st hx of same with "blocked bile duct"

## 2018-01-29 ENCOUNTER — Emergency Department: Payer: Medicare Other

## 2018-01-29 ENCOUNTER — Other Ambulatory Visit: Payer: Self-pay

## 2018-01-29 ENCOUNTER — Encounter: Payer: Self-pay | Admitting: Radiology

## 2018-01-29 ENCOUNTER — Inpatient Hospital Stay
Admission: EM | Admit: 2018-01-29 | Discharge: 2018-02-01 | DRG: 643 | Disposition: A | Discharge status: Hospice | Payer: Medicare Other | Attending: Internal Medicine | Admitting: Internal Medicine

## 2018-01-29 DIAGNOSIS — F0280 Dementia in other diseases classified elsewhere without behavioral disturbance: Secondary | ICD-10-CM | POA: Diagnosis present

## 2018-01-29 DIAGNOSIS — Z66 Do not resuscitate: Secondary | ICD-10-CM | POA: Diagnosis present

## 2018-01-29 DIAGNOSIS — K567 Ileus, unspecified: Secondary | ICD-10-CM | POA: Diagnosis present

## 2018-01-29 DIAGNOSIS — R531 Weakness: Secondary | ICD-10-CM | POA: Diagnosis not present

## 2018-01-29 DIAGNOSIS — J45909 Unspecified asthma, uncomplicated: Secondary | ICD-10-CM | POA: Diagnosis present

## 2018-01-29 DIAGNOSIS — Z515 Encounter for palliative care: Secondary | ICD-10-CM | POA: Diagnosis not present

## 2018-01-29 DIAGNOSIS — K219 Gastro-esophageal reflux disease without esophagitis: Secondary | ICD-10-CM | POA: Diagnosis present

## 2018-01-29 DIAGNOSIS — E43 Unspecified severe protein-calorie malnutrition: Secondary | ICD-10-CM

## 2018-01-29 DIAGNOSIS — Z96611 Presence of right artificial shoulder joint: Secondary | ICD-10-CM | POA: Diagnosis present

## 2018-01-29 DIAGNOSIS — G894 Chronic pain syndrome: Secondary | ICD-10-CM | POA: Diagnosis present

## 2018-01-29 DIAGNOSIS — D509 Iron deficiency anemia, unspecified: Secondary | ICD-10-CM | POA: Diagnosis present

## 2018-01-29 DIAGNOSIS — F419 Anxiety disorder, unspecified: Secondary | ICD-10-CM | POA: Diagnosis present

## 2018-01-29 DIAGNOSIS — R197 Diarrhea, unspecified: Secondary | ICD-10-CM | POA: Diagnosis not present

## 2018-01-29 DIAGNOSIS — I10 Essential (primary) hypertension: Secondary | ICD-10-CM | POA: Diagnosis present

## 2018-01-29 DIAGNOSIS — K5641 Fecal impaction: Secondary | ICD-10-CM | POA: Diagnosis present

## 2018-01-29 DIAGNOSIS — G4700 Insomnia, unspecified: Secondary | ICD-10-CM | POA: Diagnosis present

## 2018-01-29 DIAGNOSIS — R627 Adult failure to thrive: Secondary | ICD-10-CM | POA: Diagnosis not present

## 2018-01-29 DIAGNOSIS — M4722 Other spondylosis with radiculopathy, cervical region: Secondary | ICD-10-CM | POA: Diagnosis present

## 2018-01-29 DIAGNOSIS — G309 Alzheimer's disease, unspecified: Secondary | ICD-10-CM | POA: Diagnosis present

## 2018-01-29 DIAGNOSIS — D72829 Elevated white blood cell count, unspecified: Secondary | ICD-10-CM | POA: Diagnosis present

## 2018-01-29 DIAGNOSIS — R911 Solitary pulmonary nodule: Secondary | ICD-10-CM | POA: Diagnosis not present

## 2018-01-29 DIAGNOSIS — E785 Hyperlipidemia, unspecified: Secondary | ICD-10-CM | POA: Diagnosis present

## 2018-01-29 DIAGNOSIS — E871 Hypo-osmolality and hyponatremia: Secondary | ICD-10-CM | POA: Diagnosis not present

## 2018-01-29 DIAGNOSIS — K5900 Constipation, unspecified: Secondary | ICD-10-CM | POA: Diagnosis not present

## 2018-01-29 DIAGNOSIS — E876 Hypokalemia: Secondary | ICD-10-CM | POA: Diagnosis present

## 2018-01-29 DIAGNOSIS — Z96643 Presence of artificial hip joint, bilateral: Secondary | ICD-10-CM | POA: Diagnosis present

## 2018-01-29 DIAGNOSIS — R112 Nausea with vomiting, unspecified: Secondary | ICD-10-CM | POA: Diagnosis present

## 2018-01-29 DIAGNOSIS — D649 Anemia, unspecified: Secondary | ICD-10-CM | POA: Diagnosis not present

## 2018-01-29 DIAGNOSIS — E222 Syndrome of inappropriate secretion of antidiuretic hormone: Secondary | ICD-10-CM | POA: Diagnosis present

## 2018-01-29 DIAGNOSIS — R1110 Vomiting, unspecified: Secondary | ICD-10-CM | POA: Diagnosis not present

## 2018-01-29 LAB — COMPREHENSIVE METABOLIC PANEL
ALT: 14 U/L (ref 14–54)
ANION GAP: 12 (ref 5–15)
AST: 25 U/L (ref 15–41)
Albumin: 3.6 g/dL (ref 3.5–5.0)
Alkaline Phosphatase: 61 U/L (ref 38–126)
BUN: 12 mg/dL (ref 6–20)
CALCIUM: 8.7 mg/dL — AB (ref 8.9–10.3)
CO2: 23 mmol/L (ref 22–32)
Chloride: 74 mmol/L — ABNORMAL LOW (ref 101–111)
Creatinine, Ser: 0.57 mg/dL (ref 0.44–1.00)
Glucose, Bld: 111 mg/dL — ABNORMAL HIGH (ref 65–99)
Potassium: 2.9 mmol/L — ABNORMAL LOW (ref 3.5–5.1)
SODIUM: 109 mmol/L — AB (ref 135–145)
TOTAL PROTEIN: 6.4 g/dL — AB (ref 6.5–8.1)
Total Bilirubin: 0.4 mg/dL (ref 0.3–1.2)

## 2018-01-29 LAB — URINALYSIS, COMPLETE (UACMP) WITH MICROSCOPIC
BACTERIA UA: NONE SEEN
BILIRUBIN URINE: NEGATIVE
GLUCOSE, UA: NEGATIVE mg/dL
HGB URINE DIPSTICK: NEGATIVE
KETONES UR: 20 mg/dL — AB
LEUKOCYTES UA: NEGATIVE
NITRITE: NEGATIVE
PH: 6 (ref 5.0–8.0)
Protein, ur: NEGATIVE mg/dL
Specific Gravity, Urine: 1.017 (ref 1.005–1.030)
WBC, UA: NONE SEEN WBC/hpf (ref 0–5)

## 2018-01-29 LAB — BASIC METABOLIC PANEL
Anion gap: 11 (ref 5–15)
Anion gap: 6 (ref 5–15)
BUN: 11 mg/dL (ref 6–20)
BUN: 12 mg/dL (ref 6–20)
CALCIUM: 8.3 mg/dL — AB (ref 8.9–10.3)
CHLORIDE: 81 mmol/L — AB (ref 101–111)
CHLORIDE: 82 mmol/L — AB (ref 101–111)
CO2: 21 mmol/L — ABNORMAL LOW (ref 22–32)
CO2: 24 mmol/L (ref 22–32)
CREATININE: 0.48 mg/dL (ref 0.44–1.00)
CREATININE: 0.51 mg/dL (ref 0.44–1.00)
Calcium: 8 mg/dL — ABNORMAL LOW (ref 8.9–10.3)
GFR calc Af Amer: >60 mL/min (ref >60)
GFR calc non Af Amer: >60 mL/min (ref >60)
GFR calc non Af Amer: >60 mL/min (ref >60)
GLUCOSE: 94 mg/dL (ref 65–99)
GLUCOSE: 110 mg/dL — AB (ref 65–99)
Potassium: 2.6 mmol/L — CL (ref 3.5–5.1)
Potassium: 3.7 mmol/L (ref 3.5–5.1)
Sodium: 113 mmol/L — CL (ref 135–145)
Sodium: 112 mmol/L — CL (ref 135–145)

## 2018-01-29 LAB — CBC
HCT: 31.1 % — ABNORMAL LOW (ref 35.0–47.0)
Hemoglobin: 10.3 g/dL — ABNORMAL LOW (ref 12.0–16.0)
MCH: 23.3 pg — AB (ref 26.0–34.0)
MCHC: 33 g/dL (ref 32.0–36.0)
MCV: 70.6 fL — AB (ref 80.0–100.0)
PLATELETS: 873 10*3/uL — AB (ref 150–440)
RBC: 4.41 MIL/uL (ref 3.80–5.20)
RDW: 16.4 % — ABNORMAL HIGH (ref 11.5–14.5)
WBC: 13.2 10*3/uL — ABNORMAL HIGH (ref 3.6–11.0)

## 2018-01-29 LAB — LACTIC ACID, PLASMA: Lactic Acid, Venous: 1.5 mmol/L (ref 0.5–1.9)

## 2018-01-29 LAB — LIPASE, BLOOD: Lipase: 19 U/L (ref 11–51)

## 2018-01-29 LAB — PHOSPHORUS: Phosphorus: 1.9 mg/dL — ABNORMAL LOW (ref 2.5–4.6)

## 2018-01-29 LAB — MAGNESIUM: Magnesium: 1.4 mg/dL — ABNORMAL LOW (ref 1.7–2.4)

## 2018-01-29 MED ORDER — FENTANYL 25 MCG/HR TD PT72
25.0000 ug | MEDICATED_PATCH | TRANSDERMAL | Status: DC
  Administered 2018-01-29 – 2018-02-01 (×2): 25 ug via TRANSDERMAL
  Filled 2018-01-29 (×2): qty 1

## 2018-01-29 MED ORDER — LUBIPROSTONE 24 MCG PO CAPS
24.0000 ug | ORAL_CAPSULE | Freq: Every day | ORAL | Status: DC | PRN
  Filled 2018-01-29: qty 1

## 2018-01-29 MED ORDER — BISACODYL 5 MG PO TBEC: 5.0000 mg | DELAYED_RELEASE_TABLET | Freq: Every day | ORAL | Status: DC | PRN

## 2018-01-29 MED ORDER — OXYCODONE-ACETAMINOPHEN 5-325 MG PO TABS
1.0000 | ORAL_TABLET | Freq: Three times a day (TID) | ORAL | Status: DC
  Administered 2018-01-29 – 2018-02-01 (×11): 1 via ORAL
  Filled 2018-01-29 (×11): qty 1

## 2018-01-29 MED ORDER — ACETAMINOPHEN 325 MG PO TABS: 650.0000 mg | ORAL_TABLET | Freq: Four times a day (QID) | ORAL | Status: DC | PRN

## 2018-01-29 MED ORDER — ACETAMINOPHEN 650 MG RE SUPP: 650.0000 mg | Freq: Four times a day (QID) | RECTAL | Status: DC | PRN

## 2018-01-29 MED ORDER — SODIUM CHLORIDE 0.9 % IV BOLUS
1000.0000 mL | Freq: Once | INTRAVENOUS | Status: AC
  Administered 2018-01-29: 1000 mL via INTRAVENOUS

## 2018-01-29 MED ORDER — ONDANSETRON HCL 4 MG/2ML IJ SOLN
INTRAMUSCULAR | Status: AC
  Administered 2018-01-29: 4 mg via INTRAVENOUS
  Filled 2018-01-29: qty 2

## 2018-01-29 MED ORDER — ONDANSETRON HCL 4 MG PO TABS
4.0000 mg | ORAL_TABLET | Freq: Four times a day (QID) | ORAL | Status: DC | PRN
  Administered 2018-02-01: 4 mg via ORAL
  Filled 2018-01-29: qty 1

## 2018-01-29 MED ORDER — DONEPEZIL HCL 5 MG PO TABS
10.0000 mg | ORAL_TABLET | Freq: Every day | ORAL | Status: DC
  Administered 2018-01-29 – 2018-01-31 (×3): 10 mg via ORAL
  Filled 2018-01-29: qty 2
  Filled 2018-01-29: qty 1
  Filled 2018-01-29 (×2): qty 2

## 2018-01-29 MED ORDER — OXYCODONE HCL 5 MG PO TABS
ORAL_TABLET | ORAL | Status: AC
  Filled 2018-01-29: qty 1

## 2018-01-29 MED ORDER — RISPERIDONE 1 MG PO TABS
0.5000 mg | ORAL_TABLET | Freq: Every evening | ORAL | Status: DC | PRN
Start: 2018-01-29 — End: 2018-02-02
  Administered 2018-01-29 – 2018-01-31 (×3): 0.5 mg via ORAL
  Filled 2018-01-29 (×3): qty 0.5

## 2018-01-29 MED ORDER — ENOXAPARIN SODIUM 40 MG/0.4ML ~~LOC~~ SOLN
40.0000 mg | SUBCUTANEOUS | Status: DC
  Administered 2018-01-29 – 2018-01-31 (×3): 40 mg via SUBCUTANEOUS
  Filled 2018-01-29 (×3): qty 0.4

## 2018-01-29 MED ORDER — IOHEXOL 300 MG/ML  SOLN
75.0000 mL | Freq: Once | INTRAMUSCULAR | Status: AC | PRN
  Administered 2018-01-29: 75 mL via INTRAVENOUS

## 2018-01-29 MED ORDER — SENNOSIDES-DOCUSATE SODIUM 8.6-50 MG PO TABS: 1.0000 | ORAL_TABLET | Freq: Every evening | ORAL | Status: DC | PRN

## 2018-01-29 MED ORDER — POTASSIUM & SODIUM PHOSPHATES 280-160-250 MG PO PACK
2.0000 | PACK | ORAL | Status: AC
  Administered 2018-01-29 (×2): 2 via ORAL
  Filled 2018-01-29 (×3): qty 2

## 2018-01-29 MED ORDER — ONDANSETRON HCL 4 MG/2ML IJ SOLN
4.0000 mg | Freq: Four times a day (QID) | INTRAMUSCULAR | Status: DC | PRN
  Administered 2018-01-30 – 2018-01-31 (×4): 4 mg via INTRAVENOUS
  Filled 2018-01-29 (×4): qty 2

## 2018-01-29 MED ORDER — RISPERIDONE 0.5 MG PO TBDP
ORAL_TABLET | ORAL | Status: AC
  Filled 2018-01-29: qty 1

## 2018-01-29 MED ORDER — MAGNESIUM SULFATE 2 GM/50ML IV SOLN
2.0000 g | Freq: Once | INTRAVENOUS | Status: AC
  Administered 2018-01-29: 2 g via INTRAVENOUS
  Filled 2018-01-29: qty 50

## 2018-01-29 MED ORDER — CLOPIDOGREL BISULFATE 75 MG PO TABS
75.0000 mg | ORAL_TABLET | Freq: Every day | ORAL | Status: DC
  Administered 2018-01-29 – 2018-02-01 (×4): 75 mg via ORAL
  Filled 2018-01-29 (×4): qty 1

## 2018-01-29 MED ORDER — POLYETHYLENE GLYCOL 3350 17 G PO PACK
17.0000 g | PACK | Freq: Every day | ORAL | Status: DC | PRN
  Administered 2018-01-29: 17 g via ORAL
  Filled 2018-01-29: qty 1

## 2018-01-29 MED ORDER — POTASSIUM CHLORIDE 10 MEQ/100ML IV SOLN
10.0000 meq | INTRAVENOUS | Status: AC
  Administered 2018-01-29 (×2): 10 meq via INTRAVENOUS
  Filled 2018-01-29 (×2): qty 100

## 2018-01-29 MED ORDER — ONDANSETRON HCL 4 MG/2ML IJ SOLN
4.0000 mg | Freq: Once | INTRAMUSCULAR | Status: AC
  Administered 2018-01-29: 4 mg via INTRAVENOUS

## 2018-01-29 MED ORDER — ATORVASTATIN CALCIUM 10 MG PO TABS
10.0000 mg | ORAL_TABLET | Freq: Every day | ORAL | Status: DC
  Administered 2018-01-29 – 2018-02-01 (×4): 10 mg via ORAL
  Filled 2018-01-29 (×4): qty 1

## 2018-01-29 MED ORDER — POTASSIUM CHLORIDE IN NACL 40-0.9 MEQ/L-% IV SOLN
INTRAVENOUS | Status: DC
  Administered 2018-01-29 – 2018-01-30 (×2): 60 mL/h via INTRAVENOUS
  Filled 2018-01-29 (×4): qty 1000

## 2018-01-29 MED ORDER — SORBITOL 70 % SOLN
960.0000 mL | TOPICAL_OIL | Freq: Once | ORAL | Status: AC
  Administered 2018-01-29: 960 mL via RECTAL
  Filled 2018-01-29: qty 473

## 2018-01-29 MED ORDER — RISPERIDONE 1 MG PO TABS
ORAL_TABLET | ORAL | Status: AC
  Filled 2018-01-29: qty 1

## 2018-01-29 MED ORDER — SENNOSIDES-DOCUSATE SODIUM 8.6-50 MG PO TABS
2.0000 | ORAL_TABLET | Freq: Every day | ORAL | Status: DC
  Administered 2018-01-29 – 2018-02-01 (×4): 2 via ORAL
  Filled 2018-01-29 (×4): qty 2

## 2018-01-29 MED ORDER — PANTOPRAZOLE SODIUM 40 MG PO TBEC
40.0000 mg | DELAYED_RELEASE_TABLET | Freq: Every day | ORAL | Status: DC
  Administered 2018-01-29 – 2018-02-01 (×4): 40 mg via ORAL
  Filled 2018-01-29 (×4): qty 1

## 2018-01-29 MED ORDER — BISACODYL 10 MG RE SUPP: 10.0000 mg | Freq: Every day | RECTAL | Status: DC | PRN

## 2018-01-29 MED ORDER — MOMETASONE FURO-FORMOTEROL FUM 200-5 MCG/ACT IN AERO
2.0000 | INHALATION_SPRAY | Freq: Two times a day (BID) | RESPIRATORY_TRACT | Status: DC
  Administered 2018-01-29 – 2018-01-31 (×5): 2 via RESPIRATORY_TRACT
  Filled 2018-01-29: qty 8.8

## 2018-01-29 MED ORDER — ONDANSETRON HCL 4 MG/2ML IJ SOLN
INTRAMUSCULAR | Status: AC
  Filled 2018-01-29: qty 2

## 2018-01-29 MED ORDER — POTASSIUM CHLORIDE CRYS ER 20 MEQ PO TBCR
40.0000 meq | EXTENDED_RELEASE_TABLET | Freq: Two times a day (BID) | ORAL | Status: AC
  Administered 2018-01-29 (×2): 40 meq via ORAL
  Filled 2018-01-29 (×2): qty 2

## 2018-01-29 MED ORDER — OCUVITE-LUTEIN PO CAPS
1.0000 | ORAL_CAPSULE | Freq: Every day | ORAL | Status: DC
  Administered 2018-01-29 – 2018-01-30 (×2): 1 via ORAL
  Filled 2018-01-29 (×3): qty 1

## 2018-01-29 MED ORDER — AMLODIPINE BESYLATE 5 MG PO TABS
2.5000 mg | ORAL_TABLET | Freq: Every day | ORAL | Status: DC
  Administered 2018-01-29 – 2018-02-01 (×4): 2.5 mg via ORAL
  Filled 2018-01-29 (×4): qty 1

## 2018-01-29 MED ORDER — OXYCODONE-ACETAMINOPHEN 5-325 MG PO TABS
ORAL_TABLET | ORAL | Status: AC
  Filled 2018-01-29: qty 1

## 2018-01-29 MED ORDER — OXYCODONE HCL 5 MG PO TABS
5.0000 mg | ORAL_TABLET | Freq: Three times a day (TID) | ORAL | Status: DC
  Administered 2018-01-29 – 2018-02-01 (×11): 5 mg via ORAL
  Filled 2018-01-29 (×11): qty 1

## 2018-01-29 MED ORDER — LACTATED RINGERS IV SOLN
INTRAVENOUS | Status: DC
  Administered 2018-01-29 (×2): via INTRAVENOUS

## 2018-01-29 MED ORDER — OXYCODONE-ACETAMINOPHEN 10-325 MG PO TABS: 1.0000 | ORAL_TABLET | Freq: Three times a day (TID) | ORAL | Status: DC

## 2018-01-29 NOTE — ED Provider Notes (Signed)
[facility] Emergency Department Provider Note   ____________________________________________   First MD Initiated Contact with Patient 01/29/18 [phone]     (approximate)  I have reviewed the triage vital signs and the nursing notes.   HISTORY  Chief Complaint Emesis    HPI Aimee Jones is a 72 y.o. female who comes into the hospital today with some nausea and vomiting.  The patient's husband states that right before Christmas she had a bowel blockage.  He reports that she was vomiting for the past 2 days so he was concerned she might have another bowel blockage.  The patient denies any abdominal pain but she does have a history of Alzheimer's dementia.  The patient's emesis has been brown in appearance.  The patient's husband is unsure when she had her last bowel movement as she does have a history of constipation and takes Amitiza.  The patient states that she has some occasional abdominal pain.  She did not have surgery for her previous blockage but it was resolved on its own with fluids and time.  The patient is here today for evaluation of her symptoms.   Past Medical History:  Diagnosis Date  . Allergy   . Anxiety    panic attacks- on occas.   . Arthritis    osteoporosis - especially hips.   . Asthma   . Confusion   . Dementia   . Depression   . Diverticulosis   . ETOH abuse   . Falls   . GERD (gastroesophageal reflux disease)   . Hyperlipidemia   . Hypertension   . Hyponatremia 07/05/2015  . Memory loss   . Normal 24 hour ambulatory pH monitoring study    good acid suppression  . Observed seizure-like activity ([place]) 07/05/2015   observed by husband , Dr Doy Mince  . Seizures ([place])   . Shortness of breath   . Stroke [facility])     Patient Active Problem List   Diagnosis Date Noted  . Chronic neck pain 12/16/2017  . Skin irritation 12/16/2017  . Pressure injury of skin 09/25/2017  . Abdominal pain 09/24/2017  . Healthcare maintenance  08/07/2017  . Headache 11/14/2016  . Hearing loss 05/24/2016  . Enteritis due to Clostridium difficile 05/24/2016  . Leukocytosis 05/12/2016  . Thrombocytosis ([place]) 05/12/2016  . Cervical stenosis of spine 01/10/2016  . Weight loss 12/06/2015  . Dysuria 10/14/2015  . Cerebrovascular accident (CVA) due to bilateral embolism of posterior cerebral arteries ([place]) 09/27/2015  . Chronic pain syndrome 09/27/2015  . Elevated blood pressure 07/07/2015  . Seizure-like activity ([place]) 07/04/2015  . Seizure ([place]) 07/04/2015  . Depression   . GI bleed 06/24/2015  . Frequent falls   . Narcotic abuse ([place])   . Benzodiazepine abuse ([place])   . Alcohol use with alcohol-induced persisting dementia ([place])   . Generalized anxiety disorder   . Hypokalemia   . Hypomagnesemia   . Anemia, iron deficiency   . Urinary retention   . Altered mental status 06/22/2015  . Fall 06/22/2015  . Iron deficiency anemia 06/03/2015  . Medicare annual wellness visit, subsequent 04/23/2015  . Advance care planning 04/23/2015  . Senile purpura ([place]) 04/23/2015  . Abnormal CBC 04/23/2015  . Sleep apnea 12/11/2014  . 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 Stroke ([place])   . Cerebral thrombosis with cerebral infarction ([place]) 10/13/2014  . HLD (hyperlipidemia)   . Facial droop 10/12/2014  . CKD (chronic kidney disease), stage III ([place]) 10/12/2014  .  S/P shoulder replacement 08/13/2014  . Postmenopausal HRT (hormone replacement therapy) 05/31/2014  . Hyponatremia 05/28/2014  . Shoulder pain, bilateral 10/25/2012  . Parkinsonism ([place]) 08/22/2012  . MEMORY LOSS 06/22/2010  . ALCOHOL ABUSE 03/08/2010  . 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 OBSTRUCTIVE CHRONIC BRONCHITIS WITH EXACERBATION 01/02/2008  . GLUCOSE INTOLERANCE 11/15/2007  . GERD 07/25/2007  . OSTEOARTHRITIS 07/25/2007  . PANIC ATTACK 05/01/2007  . Essential hypertension 05/01/2007  . ALLERGIC RHINITIS 05/01/2007  . ASTHMA 05/01/2007  .  DIVERTICULOSIS, COLON 05/01/2007  . MENOPAUSAL DISORDER 05/01/2007  . FIBROMYALGIA 05/01/2007    Past Surgical History:  Procedure Laterality Date  . ABDOMINAL HYSTERECTOMY    . BREAST SURGERY     3x- biposies   . COLONOSCOPY WITH PROPOFOL N/A 08/05/2015   Procedure: COLONOSCOPY WITH PROPOFOL;  Surgeon: [name], MD;  Location: [facility] ENDOSCOPY;  Service: Endoscopy;  Laterality: N/A;  . DOBUTAMINE STRESS ECHO  07/09/2001   normal  . ESOPHAGOGASTRODUODENOSCOPY  06/11/2006   normal  . ESOPHAGOGASTRODUODENOSCOPY (EGD) WITH PROPOFOL N/A 08/05/2015   Procedure: ESOPHAGOGASTRODUODENOSCOPY (EGD) WITH PROPOFOL;  Surgeon: [name], MD;  Location: [facility];  Service: Endoscopy;  Laterality: N/A;  . EYE SURGERY     cataracts removed, IOL- both eyes   . JOINT REPLACEMENT     bilateral hips  . LOOP RECORDER IMPLANT N/A 10/14/2014   Procedure: LOOP RECORDER IMPLANT;  Surgeon: Evans Lance, MD;  Location: [facility] CATH LAB;  Service: Cardiovascular;  Laterality: N/A;  . NASAL SINUS SURGERY  1990  . REVERSE SHOULDER ARTHROPLASTY Right 08/13/2014   Procedure: REVERSE SHOULDER ARTHROPLASTY;  Surgeon: Marin Shutter, MD;  Location: [place];  Service: Orthopedics;  Laterality: Right;  interscalene block  . SHOULDER ARTHROSCOPY Left   . TEE WITHOUT CARDIOVERSION N/A 10/14/2014   Procedure: TRANSESOPHAGEAL ECHOCARDIOGRAM (TEE);  Surgeon: Sueanne Margarita, MD;  Location: [place];  Service: Cardiovascular;  Laterality: N/A;  . TOTAL HIP ARTHROPLASTY     left 1991/right 2008  . ULNAR NERVE TRANSPOSITION Right 08/01/2016   Procedure: RIGHT ULNAR NEUROPLASTY AT THE ELBOW;  Surgeon: Milly Jakob, MD;  Location: [place];  Service: Orthopedics;  Laterality: Right;    Prior to Admission medications   Medication Sig Start Date End Date Taking? Authorizing Provider  acetaminophen (TYLENOL) 325 MG tablet Take 2 tablets (650 mg total) by mouth 3 (three) times daily as needed. 11/13/16    Tonia Ghent, MD  amLODipine (NORVASC) 2.5 MG tablet TAKE 1 TABLET BY MOUTH EVERY DAY 01/16/18   Tonia Ghent, MD  atorvastatin (LIPITOR) 10 MG tablet TAKE 1 TABLET BY MOUTH EVERY DAY 10/22/17   Tonia Ghent, MD  citalopram (CELEXA) 20 MG tablet TAKE 1 TABLET BY MOUTH EVERY DAY 10/22/17   Tonia Ghent, MD  clopidogrel (PLAVIX) 75 MG tablet TAKE 1 TABLET BY MOUTH EVERY DAY 10/15/17   Tonia Ghent, MD  diclofenac sodium (VOLTAREN) 1 % GEL Apply 4 g topically 4 (four) times daily as needed.    [provider]  donepezil (ARICEPT) 10 MG tablet TAKE 1 TABLET BY MOUTH EVERYDAY AT BEDTIME 10/15/17   Tonia Ghent, MD  doxepin (SINEQUAN) 25 MG capsule TAKE 1 CAPSULE BY MOUTH EVERYDAY AT BEDTIME 10/24/17   Tonia Ghent, MD  fentaNYL (DURAGESIC - DOSED MCG/HR) 25 MCG/HR patch Place 1 patch (25 mcg total) onto the skin every 3 (three) days. 10/11/17   Tonia Ghent, MD  fluticasone Asencion Islam) 50 MCG/ACT  nasal spray Place into the nose. 03/16/15   [provider]  hydrocortisone 2.5 % cream APPLY RECTALLY 2 TIMES DAILY 12/14/16   Tonia Ghent, MD  ibuprofen (MOTRIN IB) 200 MG tablet Take 2 tablets (400 mg total) by mouth 3 (three) times daily as needed (with food). 11/13/16   Tonia Ghent, MD  lubiprostone (AMITIZA) 24 MCG capsule Take 24 mcg by mouth daily as needed for constipation.    [provider]  Multiple Vitamins-Minerals (EYE VITAMINS) CAPS Take 1 capsule by mouth daily.    [provider]  Omega-3 Fatty Acids (FISH OIL PO) Take by mouth.    [provider]  ondansetron (ZOFRAN) 8 MG tablet Take 8 mg by mouth every 8 (eight) hours as needed.  11/26/15   [provider]  ondansetron (ZOFRAN) 8 MG tablet TAKE 1 TABLET BY MOUTH EVERY 8 HOURS AS NEEDED FOR NAUSEA AND VOMITING 10/30/17   [name], MD  oxyCODONE-acetaminophen (PERCOCET) 10-325 MG tablet Take 1 tablet by mouth 3 (three) times daily.    [provider]  pantoprazole (PROTONIX) 40 MG tablet Take 1 tablet (40 mg total) by mouth daily. 10/17/17   Levin Erp, PA  polyvinyl alcohol (LIQUIFILM TEARS) 1.4 % ophthalmic solution Place 1 drop into both eyes as needed.     [provider]  SYMBICORT 160-4.5 MCG/ACT inhaler INHALE 2 PUFFS INTO THE LUNGS 2 (TWO) TIMES DAILY AS NEEDED (SHORTNESS OF BREATH). 02/05/17   Tonia Ghent, MD  vitamin E 400 UNIT capsule Take by mouth.    [provider]  Vitamins/Minerals TABS Take by mouth.    [provider]    Allergies Tramadol; Latex; and Penicillins  Family History  Problem Relation Age of Onset  . Heart block Mother   . Stroke Mother   . Heart attack Father   . Sudden death Brother   . Heart attack Brother   . Hypertension Brother   . Esophageal cancer Brother   . Stroke Brother   . Stomach cancer Neg Hx   . Colon cancer Neg Hx     Social History Social History   Tobacco Use  . Smoking status: Never Smoker  . Smokeless tobacco: Never Used  Substance Use Topics  . Alcohol use: No    Alcohol/week: 0.0 oz  . Drug use: No    Review of Systems  Constitutional: No fever/chills Eyes: No visual changes. ENT: No sore throat. Cardiovascular: Denies chest pain. Respiratory: Denies shortness of breath. Gastrointestinal: Nausea and vomiting with no abdominal pain. The patient also has some constipation. Genitourinary: Negative for dysuria. Musculoskeletal: Negative for back pain. Skin: Negative for rash. Neurological: Negative for headaches, focal weakness or numbness.   ____________________________________________   PHYSICAL EXAM:  VITAL SIGNS: ED Triage Vitals [01/28/18 2335]  Enc Vitals Group     BP (!) 142/98     Pulse Rate 80     Resp 18     Temp 97.7 F (36.5 C)     Temp Source Oral     SpO2 99 %     Weight 128 lb (58.1 kg)     Height 5\' 7"  (1.702 m)     Head Circumference      Peak Flow      Pain Score 0     Pain Loc       Pain Edu?      Excl. in [place]?     Constitutional: Alert and oriented  to self. Well appearing and in moderate distress. Eyes: Conjunctivae are normal. PERRL. EOMI. Head: Atraumatic. Nose: No congestion/rhinnorhea. Mouth/Throat: Mucous membranes are moist.  Oropharynx non-erythematous. Cardiovascular: Normal rate, regular rhythm. Grossly normal heart sounds.  Good peripheral circulation. Respiratory: Normal respiratory effort.  No retractions. Lungs CTAB. Gastrointestinal: Soft and nontender. No distention.  Diminished bowel sounds Musculoskeletal: No lower extremity tenderness nor edema.   Neurologic:  Normal speech and language.  Skin:  Skin is warm, dry and intact.  Psychiatric: Mood and affect are normal.   ____________________________________________   LABS (all labs ordered are listed, but only abnormal results are displayed)  Labs Reviewed  COMPREHENSIVE METABOLIC PANEL - Abnormal; Notable for the following components:      Result Value   Sodium 109 (*)    Potassium 2.9 (*)    Chloride 74 (*)    Glucose, Bld 111 (*)    Calcium 8.7 (*)    Total Protein 6.4 (*)    All other components within normal limits  CBC - Abnormal; Notable for the following components:   WBC 15.7 (*)    Hemoglobin 10.5 (*)    HCT 28.8 (*)    MCV 70.3 (*)    MCH 25.6 (*)    MCHC 36.5 (*)    RDW 16.4 (*)    Platelets 794 (*)    All other components within normal limits  URINALYSIS, COMPLETE (UACMP) WITH MICROSCOPIC - Abnormal; Notable for the following components:   Color, Urine YELLOW (*)    APPearance CLEAR (*)    Ketones, ur 20 (*)    Squamous Epithelial / LPF 0-5 (*)    All other components within normal limits  CBC - Abnormal; Notable for the following components:   WBC 13.2 (*)    Hemoglobin 10.3 (*)    HCT 31.1 (*)    MCV 70.6 (*)    MCH 23.3 (*)    RDW 16.4 (*)    Platelets 873 (*)    All other components within normal limits  LIPASE, BLOOD  LACTIC ACID, PLASMA  BASIC METABOLIC  PANEL  BASIC METABOLIC PANEL  MAGNESIUM  PHOSPHORUS   ____________________________________________  EKG  none ____________________________________________  RADIOLOGY  ED MD interpretation:  CT abd and pelvis: Cecum filled stool with additional stool noted along the colon, pneumatosis suggested along the sigmoid colon, scattered cystic lesions within the liver, coronary artery calcifications.  Official radiology report(s): Ct Abdomen Pelvis W Contrast  Result Date: 01/29/2018 CLINICAL DATA:  Acute onset of nausea, vomiting and diarrhea. EXAM: CT ABDOMEN AND PELVIS WITH CONTRAST TECHNIQUE: Multidetector CT imaging of the abdomen and pelvis was performed using the standard protocol following bolus administration of intravenous contrast. CONTRAST:  60mL OMNIPAQUE IOHEXOL 300 MG/ML  SOLN COMPARISON:  CT of the abdomen and pelvis performed 09/24/2017, and MRCP performed 09/27/2017. Right upper quadrant ultrasound performed 09/26/2017 FINDINGS: Lower chest: Scattered coronary artery calcifications are seen. The visualized lung bases are clear. Hepatobiliary: Scattered cystic lesions noted within the liver, measuring up to 2.4 cm in size. The liver is otherwise unremarkable. The gallbladder is unremarkable in appearance. The common bile duct remains normal in caliber. Pancreas: The pancreas is within normal limits. Spleen: The spleen is unremarkable in appearance. Adrenals/Urinary Tract: The adrenal glands are unremarkable in appearance. The kidneys are within normal limits. There is no evidence of hydronephrosis. No renal or ureteral stones are identified. No perinephric stranding is seen. Stomach/Bowel: The cecum is filled with stool. The appendix is not definitely  seen; there is no evidence for appendicitis. Pneumatosis is suggested along the sigmoid colon. The small bowel is grossly unremarkable. The stomach is within normal limits. Vascular/Lymphatic: Scattered calcification is seen along the  abdominal aorta and its branches. The abdominal aorta is otherwise grossly unremarkable. The inferior vena cava is grossly unremarkable. No retroperitoneal lymphadenopathy is seen. No pelvic sidewall lymphadenopathy is identified. Reproductive: The bladder is mildly distended and grossly unremarkable. The patient is status post hysterectomy. No suspicious adnexal masses are seen. Other: No additional soft tissue abnormalities are seen. Musculoskeletal: No acute osseous abnormalities are identified. Bilateral hip arthroplasties are grossly unremarkable in appearance, though incompletely imaged. The visualized musculature is unremarkable in appearance. IMPRESSION: 1. Cecum filled with stool, with additional stool noted along the colon. This may reflect mild constipation. 2. Pneumatosis suggested along the sigmoid colon, of uncertain significance. 3. Scattered cystic lesions within the liver, measuring up to 2.4 cm, are likely benign. 4. Scattered coronary artery calcifications. Aortic Atherosclerosis (ICD10-I70.0). Electronically Signed   By: Garald Balding M.D.   On: 01/29/2018 01:59    ____________________________________________   PROCEDURES  Procedure(s) performed: None  Procedures  Critical Care performed: No  ____________________________________________   INITIAL IMPRESSION / ASSESSMENT AND PLAN / ED COURSE  As part of my medical decision making, I reviewed the following data within the electronic MEDICAL RECORD NUMBER Notes from prior ED visits and  Controlled Substance Database   This is a 72 year old female who comes into the hospital today with some nausea and vomiting for the past 2 days.  My differential diagnosis includes gastritis, bowel obstruction, urinary tract infection.  We did check some blood work on the patient to include a CBC CMP lipase urinalysis and a lactic acid.  It was found that the patient has a sodium of 109 which is acutely decreased as it was in the 130s back  in December.  I did decide to send the patient for a CT scan of her abdomen and pelvis looking for a bowel obstruction but her CT was unremarkable.  There was a concern about pneumatosis but I did contact the surgeon on call Dr. Hampton Abbot who felt that the air noted was due to her diverticular disease and not to pneumatosis intestinalis.  The patient's lactic acid is also normal.  I did give the patient a liter of normal saline and she will be admitted to the hospitalist service for her nausea vomiting and hyponatremia. The patient has no concerns at this time.       ____________________________________________   FINAL CLINICAL IMPRESSION(S) / ED DIAGNOSES  Final diagnoses:  Non-intractable vomiting with nausea, unspecified vomiting type  Hyponatremia  Constipation, unspecified constipation type     ED Discharge Orders    None       Note:  This document was prepared using Dragon voice recognition software and may include unintentional dictation errors.    Loney Hering, MD 01/29/18 [phone]

## 2018-01-29 NOTE — Progress Notes (Signed)
[facility] NAME: Aimee Jones    MR#:  [id number]  DATE OF BIRTH:  1946-08-25  SUBJECTIVE:  CHIEF COMPLAINT:   Chief Complaint  Patient presents with  . Emesis   -Pertinent for nausea vomiting and constipation and noted to have sodium of 109. -Patient pleasantly confused, complains of neck pain and shoulder pain which is chronic.  REVIEW OF SYSTEMS:  Review of Systems  Constitutional: Negative for chills, fever and malaise/fatigue.  HENT: Negative for congestion, ear discharge, hearing loss and nosebleeds.   Eyes: Negative for blurred vision and double vision.  Respiratory: Negative for cough, shortness of breath and wheezing.   Cardiovascular: Negative for chest pain and palpitations.  Gastrointestinal: Positive for nausea and vomiting. Negative for abdominal pain, constipation and diarrhea.  Genitourinary: Negative for dysuria.  Musculoskeletal: Positive for back pain, myalgias and neck pain.  Neurological: Negative for dizziness, focal weakness, seizures, weakness and headaches.  Psychiatric/Behavioral: Negative for depression.    DRUG ALLERGIES:   Allergies  Allergen Reactions  . Tramadol Other (See Comments)    Would avoid.  History of seizure on medication  . Latex Rash  . Penicillins Rash    .Marland KitchenHas patient had a PCN reaction causing immediate rash, facial/tongue/throat swelling, SOB or lightheadedness with hypotension: No Has patient had a PCN reaction causing severe rash involving mucus membranes or skin necrosis: No Has patient had a PCN reaction that required hospitalization No Has patient had a PCN reaction occurring within the last 10 years: No If all of the above answers are "NO", then may proceed with Cephalosporin use.      VITALS:  Blood pressure 131/69, pulse 70, temperature 97.9 F (36.6 C), temperature source Oral, resp. rate 18, height 5\' 7"  (1.702 m), weight 58.1 kg (128 lb), SpO2 96  %.  PHYSICAL EXAMINATION:  Physical Exam  GENERAL:  72 y.o.-year-old elderly patient lying in the bed with no acute distress.  EYES: Pupils equal, round, reactive to light and accommodation. No scleral icterus. Extraocular muscles intact.  HEENT: Head atraumatic, normocephalic. Oropharynx and nasopharynx clear.  NECK:  Supple, no jugular venous distention. No thyroid enlargement, no tenderness.  LUNGS: Normal breath sounds bilaterally, no wheezing, rales,rhonchi or crepitation. No use of accessory muscles of respiration.  CARDIOVASCULAR: S1, S2 normal. No  rubs, or gallops. 2/6 systolic murmur present ABDOMEN: Soft, nontender, nondistended. Bowel sounds present. No organomegaly or mass.  EXTREMITIES: No pedal edema, cyanosis, or clubbing.  NEUROLOGIC: Cranial nerves II through XII are intact. Muscle strength 5/5 in all extremities. Sensation intact. Gait not checked.  PSYCHIATRIC: The patient is alert and oriented to self.  SKIN: No obvious rash, lesion, or ulcer.    LABORATORY PANEL:   CBC Recent Labs  Lab 01/29/18 0518  WBC 13.2*  HGB 10.3*  HCT 31.1*  PLT 873*   ------------------------------------------------------------------------------------------------------------------  Chemistries  Recent Labs  Lab 01/28/18 2340 01/29/18 0518  NA 109* 113*  K 2.9* 2.6*  CL 74* 81*  CO2 23 21*  GLUCOSE 111* 94  BUN 12 11  CREATININE 0.57 0.48  CALCIUM 8.7* 8.3*  MG  --  1.4*  AST 25  --   ALT 14  --   ALKPHOS 61  --   BILITOT 0.4  --    ------------------------------------------------------------------------------------------------------------------  Cardiac Enzymes No results for input(s): TROPONINI in the last 168 hours. ------------------------------------------------------------------------------------------------------------------  RADIOLOGY:  Ct Abdomen Pelvis W Contrast  Result Date: 01/29/2018 CLINICAL DATA:  Acute onset of nausea, vomiting and diarrhea.  EXAM: CT ABDOMEN AND PELVIS WITH CONTRAST TECHNIQUE: Multidetector CT imaging of the abdomen and pelvis was performed using the standard protocol following bolus administration of intravenous contrast. CONTRAST:  69mL OMNIPAQUE IOHEXOL 300 MG/ML  SOLN COMPARISON:  CT of the abdomen and pelvis performed 09/24/2017, and MRCP performed 09/27/2017. Right upper quadrant ultrasound performed 09/26/2017 FINDINGS: Lower chest: Scattered coronary artery calcifications are seen. The visualized lung bases are clear. Hepatobiliary: Scattered cystic lesions noted within the liver, measuring up to 2.4 cm in size. The liver is otherwise unremarkable. The gallbladder is unremarkable in appearance. The common bile duct remains normal in caliber. Pancreas: The pancreas is within normal limits. Spleen: The spleen is unremarkable in appearance. Adrenals/Urinary Tract: The adrenal glands are unremarkable in appearance. The kidneys are within normal limits. There is no evidence of hydronephrosis. No renal or ureteral stones are identified. No perinephric stranding is seen. Stomach/Bowel: The cecum is filled with stool. The appendix is not definitely seen; there is no evidence for appendicitis. Pneumatosis is suggested along the sigmoid colon. The small bowel is grossly unremarkable. The stomach is within normal limits. Vascular/Lymphatic: Scattered calcification is seen along the abdominal aorta and its branches. The abdominal aorta is otherwise grossly unremarkable. The inferior vena cava is grossly unremarkable. No retroperitoneal lymphadenopathy is seen. No pelvic sidewall lymphadenopathy is identified. Reproductive: The bladder is mildly distended and grossly unremarkable. The patient is status post hysterectomy. No suspicious adnexal masses are seen. Other: No additional soft tissue abnormalities are seen. Musculoskeletal: No acute osseous abnormalities are identified. Bilateral hip arthroplasties are grossly unremarkable in  appearance, though incompletely imaged. The visualized musculature is unremarkable in appearance. IMPRESSION: 1. Cecum filled with stool, with additional stool noted along the colon. This may reflect mild constipation. 2. Pneumatosis suggested along the sigmoid colon, of uncertain significance. 3. Scattered cystic lesions within the liver, measuring up to 2.4 cm, are likely benign. 4. Scattered coronary artery calcifications. Aortic Atherosclerosis (ICD10-I70.0). Electronically Signed   By: Garald Balding M.D.   On: 01/29/2018 01:59    EKG:   Orders placed or performed during the hospital encounter of 09/24/17  . EKG 12-Lead  . EKG 12-Lead  . EKG    ASSESSMENT AND PLAN:   72 year old female with advanced dementia, severe bowel obstruction, hypertension, history of hyponatremia, stroke, chronic neck pain and back pain presents to hospital secondary to nausea vomiting and constipation.  Noted to have hyponatremia.  1.  Acute on chronic hyponatremia-could be a combination of things.  Poor oral intake, increased free water intake.  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 -Appreciate nephrology consult.  IV fluids and monitor -Goal sodium increases 8-12 mEq per 24 hours  2.  Hypokalemia-secondary to ileus, constipation and GI issues. -Being replaced  3.  Constipation-laxatives added.  Monitor closely -CT with no bowel obstruction noted -Questionable pneumatosis on the CT abdomen evaluated by surgeon on admission and does not think this is perforation  4.  hypertension-continue Norvasc  5.  Chronic pain-continue pain medications  6.  DVT prophylaxis-on Lovenox  Physical therapy consult once sodium is improved   All the records are reviewed and case discussed with Care Management/Social Workerr. Management plans discussed with the patient, family and they are in agreement.  CODE STATUS: DNR  TOTAL TIME TAKING CARE OF THIS PATIENT: 38 minutes.   POSSIBLE D/C IN 1-2 DAYS,  DEPENDING ON CLINICAL CONDITION.   Gladstone Lighter M.D on 01/29/2018 at 10:51 AM  Between 7am to 6pm - Pager - [phone]  After 6pm go to [email or web address] - password [email or web address] [facility]  Office  [phone]  CC: Primary care physician; Tonia Ghent, MD

## 2018-01-29 NOTE — Consult Note (Signed)
PHARMACY RELATED CONSULT NOTE - INITIAL   Pharmacy Consult for electrolyte monitoring and replacement   Labs: Recent Labs    01/28/18 2340 01/29/18 0518  WBC 15.7* 13.2*  HGB 10.5* 10.3*  HCT 28.8* 31.1*  PLT 794* 873*  CREATININE 0.57 0.48  MG  --  1.4*  PHOS  --  1.9*  ALBUMIN 3.6  --   PROT 6.4*  --   AST 25  --   ALT 14  --   ALKPHOS 61  --   BILITOT 0.4  --    Potassium (mmol/L)  Date Value  01/29/2018 2.6 (LL)   Magnesium (mg/dL)  Date Value  01/29/2018 1.4 (L)   Phosphorus (mg/dL)  Date Value  01/29/2018 1.9 (L)  ] Estimated Creatinine Clearance: 58.3 mL/min (by C-G formula based on SCr of 0.48 mg/dL).  Assessment: Pharmacy consulted for electrolyte monitoring in 72 yo female admitted with hyponatremia secondary to N/V and poor PO intake. On admission patient had hypokalemia and hypomagnesemia.   Goal of Therapy:  K: 3.5-5 Mg: 1.8-2.4 Phos: 2.5-4.6  Plan:  Patient is receiving NaCL w/69mEq of KCL @ 68ml/hr.   She has already received KCL 77mEq IV x and KCL 56mq PO X 2 doses.  Magnesium 2gm IV also given.   Will order Phos-Nak 2 packets every 4 hours x 2 doses.  Recheck all electrolytes with AM labs.   Pernell Dupre, PharmD, BCPS Clinical Pharmacist 01/29/2018 3:10 PM

## 2018-01-29 NOTE — ED Notes (Signed)
ED Provider at bedside. 

## 2018-01-29 NOTE — Progress Notes (Signed)
CRITICAL VALUE ALERT  Critical Value:  Sodium 112  Date & Time Notied:  4/16 1612  Provider Notified: Dr. Tressia Miners  Orders Received/Actions taken:  None, nephrology already saw pt

## 2018-01-29 NOTE — ED Notes (Signed)
Pt assisted to toilet in room without complication

## 2018-01-29 NOTE — H&P (Signed)
[place] [facility] [place] NAME: Aimee Jones    MR#:  [id number]  DATE OF BIRTH:  22-Mar-1946  DATE OF ADMISSION:  01/29/2018  PRIMARY CARE PHYSICIAN: Tonia Ghent, MD   REQUESTING/REFERRING PHYSICIAN: [facility] ED  CHIEF COMPLAINT:   Chief Complaint  Patient presents with  . Emesis    HISTORY OF PRESENT ILLNESS:  Aimee Jones  is a 72 y.o. female with a known history of HTN, HLD, GERD, IDA, essential thrombocytosis, asthma, DJD/OA, anx/dep, late stage Alzheimer's dementia who p/w 2d Hx N/V. Pt is disoriented and has severe memory impairment, and is not able to provide any pertinent Hx. Hx obtained from pt's husband (DPOA) at bedside. In addition to her typically complaints of chronic B/L shoulder pain (2/2 cervical spondylosis/radiculopathy), and a chronic headache (characterized as pain behind the eyes), she has been experiencing two days of nausea and vomiting. She has thrown up multiple times per day, though he is unable to quantify. Vomitus is characterized as bilious and brown, though there was no hematemesis noted. The pt's husband states she has not eaten food in ~2d, but has been drinking plenty of water. He states she has not been eating well for ~48yrs, and believes she has lost 50-60lb in that time period. He states that she has also been having a staggering gait for ~1-26mo, which he states has gradually gotten worse. He has been helping her ambulate more recently, and she was previously (prior to 1-43mo ago) able to ambulate w/o assistance. She has not fallen. She has been constipated. There have been no reports of fever, chills, diarrhea, AP, CP, SOB, palpitations, diaphoresis, rigors, night sweats, blurred vision, focal neurological deficits, LH/LOC, urinary symptoms. Pt is DNR/DNI per husband.  PAST MEDICAL HISTORY:   Past Medical History:  Diagnosis Date  . Allergy   . Anxiety    panic attacks- on occas.   . Arthritis    osteoporosis - especially hips.   . Asthma   . Confusion   . Dementia   . Depression   . Diverticulosis   . ETOH abuse   . Falls   . GERD (gastroesophageal reflux disease)   . Hyperlipidemia   . Hypertension   . Hyponatremia 07/05/2015  . Memory loss   . Normal 24 hour ambulatory pH monitoring study    good acid suppression  . Observed seizure-like activity ([place]) 07/05/2015   observed by husband , Dr Doy Mince  . Seizures ([place])   . Shortness of breath   . Stroke [facility])     PAST SURGICAL HISTORY:   Past Surgical History:  Procedure Laterality Date  . ABDOMINAL HYSTERECTOMY    . BREAST SURGERY     3x- biposies   . COLONOSCOPY WITH PROPOFOL N/A 08/05/2015   Procedure: COLONOSCOPY WITH PROPOFOL;  Surgeon: [name], MD;  Location: [facility] ENDOSCOPY;  Service: Endoscopy;  Laterality: N/A;  . DOBUTAMINE STRESS ECHO  07/09/2001   normal  . ESOPHAGOGASTRODUODENOSCOPY  06/11/2006   normal  . ESOPHAGOGASTRODUODENOSCOPY (EGD) WITH PROPOFOL N/A 08/05/2015   Procedure: ESOPHAGOGASTRODUODENOSCOPY (EGD) WITH PROPOFOL;  Surgeon: [name], MD;  Location: [facility] ENDOSCOPY;  Service: Endoscopy;  Laterality: N/A;  . EYE SURGERY     cataracts removed, IOL- both eyes   . JOINT REPLACEMENT     bilateral hips  . LOOP RECORDER IMPLANT N/A 10/14/2014   Procedure: LOOP RECORDER IMPLANT;  Surgeon: Evans Lance, MD;  Location: [facility] CATH LAB;  Service:  Cardiovascular;  Laterality: N/A;  . NASAL SINUS SURGERY  1990  . REVERSE SHOULDER ARTHROPLASTY Right 08/13/2014   Procedure: REVERSE SHOULDER ARTHROPLASTY;  Surgeon: Marin Shutter, MD;  Location: [place];  Service: Orthopedics;  Laterality: Right;  interscalene block  . SHOULDER ARTHROSCOPY Left   . TEE WITHOUT CARDIOVERSION N/A 10/14/2014   Procedure: TRANSESOPHAGEAL ECHOCARDIOGRAM (TEE);  Surgeon: Sueanne Margarita, MD;  Location: [place];  Service: Cardiovascular;  Laterality: N/A;  . TOTAL HIP ARTHROPLASTY     left 1991/right 2008  . ULNAR  NERVE TRANSPOSITION Right 08/01/2016   Procedure: RIGHT ULNAR NEUROPLASTY AT THE ELBOW;  Surgeon: Milly Jakob, MD;  Location: [place];  Service: Orthopedics;  Laterality: Right;    SOCIAL HISTORY:   Social History   Tobacco Use  . Smoking status: Never Smoker  . Smokeless tobacco: Never Used  Substance Use Topics  . Alcohol use: No    Alcohol/week: 0.0 oz    FAMILY HISTORY:   Family History  Problem Relation Age of Onset  . Heart block Mother   . Stroke Mother   . Heart attack Father   . Sudden death Brother   . Heart attack Brother   . Hypertension Brother   . Esophageal cancer Brother   . Stroke Brother   . Stomach cancer Neg Hx   . Colon cancer Neg Hx     DRUG ALLERGIES:   Allergies  Allergen Reactions  . Tramadol Other (See Comments)    Would avoid.  History of seizure on medication  . Latex Rash  . Penicillins Rash    .Marland KitchenHas patient had a PCN reaction causing immediate rash, facial/tongue/throat swelling, SOB or lightheadedness with hypotension: No Has patient had a PCN reaction causing severe rash involving mucus membranes or skin necrosis: No Has patient had a PCN reaction that required hospitalization No Has patient had a PCN reaction occurring within the last 10 years: No If all of the above answers are "NO", then may proceed with Cephalosporin use.      REVIEW OF SYSTEMS:   Review of Systems  Constitutional: Positive for weight loss. Negative for chills, diaphoresis, fever and malaise/fatigue.  HENT: Negative for congestion, hearing loss, sore throat and tinnitus.   Eyes: Negative for blurred vision, double vision and photophobia.  Respiratory: Negative for cough, hemoptysis, sputum production and shortness of breath.   Cardiovascular: Negative for chest pain, palpitations, orthopnea, claudication, leg swelling and PND.  Gastrointestinal: Positive for constipation, nausea and vomiting. Negative for abdominal pain, blood in stool,  diarrhea, heartburn and melena.  Genitourinary: Negative for dysuria, frequency, hematuria and urgency.  Musculoskeletal: Positive for neck pain. Negative for back pain, falls and joint pain.  Skin: Negative for itching and rash.  Neurological: Positive for weakness (+) generalized weakness and headaches. Negative for dizziness, tingling, tremors, sensory change, speech change, focal weakness, seizures and loss of consciousness.    MEDICATIONS AT HOME:   Prior to Admission medications   Medication Sig Start Date End Date Taking? Authorizing Provider  acetaminophen (TYLENOL) 325 MG tablet Take 2 tablets (650 mg total) by mouth 3 (three) times daily as needed. 11/13/16   Tonia Ghent, MD  amLODipine (NORVASC) 2.5 MG tablet TAKE 1 TABLET BY MOUTH EVERY DAY 01/16/18   Tonia Ghent, MD  atorvastatin (LIPITOR) 10 MG tablet TAKE 1 TABLET BY MOUTH EVERY DAY 10/22/17   Tonia Ghent, MD  citalopram (CELEXA) 20 MG tablet TAKE 1 TABLET BY MOUTH EVERY DAY  10/22/17   Tonia Ghent, MD  clopidogrel (PLAVIX) 75 MG tablet TAKE 1 TABLET BY MOUTH EVERY DAY 10/15/17   Tonia Ghent, MD  diclofenac sodium (VOLTAREN) 1 % GEL Apply 4 g topically 4 (four) times daily as needed.    [provider]  donepezil (ARICEPT) 10 MG tablet TAKE 1 TABLET BY MOUTH EVERYDAY AT BEDTIME 10/15/17   Tonia Ghent, MD  doxepin (SINEQUAN) 25 MG capsule TAKE 1 CAPSULE BY MOUTH EVERYDAY AT BEDTIME 10/24/17   Tonia Ghent, MD  fentaNYL (DURAGESIC - DOSED MCG/HR) 25 MCG/HR patch Place 1 patch (25 mcg total) onto the skin every 3 (three) days. 10/11/17   Tonia Ghent, MD  fluticasone Asencion Islam) 50 MCG/ACT nasal spray Place into the nose. 03/16/15   [provider]  hydrocortisone 2.5 % cream APPLY RECTALLY 2 TIMES DAILY 12/14/16   Tonia Ghent, MD  ibuprofen (MOTRIN IB) 200 MG tablet Take 2 tablets (400 mg total) by mouth 3 (three) times daily as needed (with food). 11/13/16   Tonia Ghent, MD    lubiprostone (AMITIZA) 24 MCG capsule Take 24 mcg by mouth daily as needed for constipation.    [provider]  Multiple Vitamins-Minerals (EYE VITAMINS) CAPS Take 1 capsule by mouth daily.    [provider]  Omega-3 Fatty Acids (FISH OIL PO) Take by mouth.    [provider]  ondansetron (ZOFRAN) 8 MG tablet Take 8 mg by mouth every 8 (eight) hours as needed.  11/26/15   [provider]  ondansetron (ZOFRAN) 8 MG tablet TAKE 1 TABLET BY MOUTH EVERY 8 HOURS AS NEEDED FOR NAUSEA AND VOMITING 10/30/17   [name], MD  oxyCODONE-acetaminophen (PERCOCET) 10-325 MG tablet Take 1 tablet by mouth 3 (three) times daily.    [provider]  pantoprazole (PROTONIX) 40 MG tablet Take 1 tablet (40 mg total) by mouth daily. 10/17/17   Levin Erp, PA  polyvinyl alcohol (LIQUIFILM TEARS) 1.4 % ophthalmic solution Place 1 drop into both eyes as needed.     [provider]  SYMBICORT 160-4.5 MCG/ACT inhaler INHALE 2 PUFFS INTO THE LUNGS 2 (TWO) TIMES DAILY AS NEEDED (SHORTNESS OF BREATH). 02/05/17   Tonia Ghent, MD  vitamin E 400 UNIT capsule Take by mouth.    [provider]  Vitamins/Minerals TABS Take by mouth.    [provider]      VITAL SIGNS:  Blood pressure (!) 142/77, pulse 74, temperature 97.7 F (36.5 C), temperature source Oral, resp. rate 16, height 5\' 7"  (1.702 m), weight 58.1 kg (128 lb), SpO2 100 %.  PHYSICAL EXAMINATION:  Physical Exam  Constitutional: She appears well-developed. No distress.  HENT:  Head: Normocephalic and atraumatic.  Eyes: EOM are normal. No scleral icterus.  Neck: Neck supple. No JVD present. No thyromegaly present.  Cardiovascular: Normal rate, regular rhythm and normal heart sounds. Exam reveals no gallop and no friction rub.  No murmur heard. Pulmonary/Chest: Effort normal and breath sounds normal. No respiratory distress. She has no wheezes. She has no rales.   Abdominal: Soft. Bowel sounds are normal. She exhibits no distension and no mass. There is no tenderness. There is no rebound and no guarding.  Musculoskeletal: She exhibits no edema.  Lymphadenopathy:    She has no cervical adenopathy.  Neurological: She is alert.  Skin: Skin is warm and dry. No rash noted. She is not diaphoretic. No erythema.  Psychiatric: Her mood appears not  anxious. Her affect is not angry, not blunt, not labile and not inappropriate. Her speech is tangential. Her speech is not rapid and/or pressured, not delayed and not slurred. She is slowed and withdrawn. She is not agitated, not aggressive, not hyperactive, not actively hallucinating and not combative. Thought content is not paranoid and not delusional. Cognition and memory are impaired. She does not express impulsivity or inappropriate judgment. She exhibits abnormal recent memory and abnormal remote memory. She is attentive.      LABORATORY PANEL:   CBC Recent Labs  Lab 01/28/18 2340  WBC 15.7*  HGB 10.5*  HCT 28.8*  PLT 794*   ------------------------------------------------------------------------------------------------------------------  Chemistries  Recent Labs  Lab 01/28/18 2340  NA 109*  K 2.9*  CL 74*  CO2 23  GLUCOSE 111*  BUN 12  CREATININE 0.57  CALCIUM 8.7*  AST 25  ALT 14  ALKPHOS 61  BILITOT 0.4   ------------------------------------------------------------------------------------------------------------------  Cardiac Enzymes No results for input(s): TROPONINI in the last 168 hours. ------------------------------------------------------------------------------------------------------------------  RADIOLOGY:  Ct Abdomen Pelvis W Contrast  Result Date: 01/29/2018 CLINICAL DATA:  Acute onset of nausea, vomiting and diarrhea. EXAM: CT ABDOMEN AND PELVIS WITH CONTRAST TECHNIQUE: Multidetector CT imaging of the abdomen and pelvis was performed using the standard protocol  following bolus administration of intravenous contrast. CONTRAST:  53mL OMNIPAQUE IOHEXOL 300 MG/ML  SOLN COMPARISON:  CT of the abdomen and pelvis performed 09/24/2017, and MRCP performed 09/27/2017. Right upper quadrant ultrasound performed 09/26/2017 FINDINGS: Lower chest: Scattered coronary artery calcifications are seen. The visualized lung bases are clear. Hepatobiliary: Scattered cystic lesions noted within the liver, measuring up to 2.4 cm in size. The liver is otherwise unremarkable. The gallbladder is unremarkable in appearance. The common bile duct remains normal in caliber. Pancreas: The pancreas is within normal limits. Spleen: The spleen is unremarkable in appearance. Adrenals/Urinary Tract: The adrenal glands are unremarkable in appearance. The kidneys are within normal limits. There is no evidence of hydronephrosis. No renal or ureteral stones are identified. No perinephric stranding is seen. Stomach/Bowel: The cecum is filled with stool. The appendix is not definitely seen; there is no evidence for appendicitis. Pneumatosis is suggested along the sigmoid colon. The small bowel is grossly unremarkable. The stomach is within normal limits. Vascular/Lymphatic: Scattered calcification is seen along the abdominal aorta and its branches. The abdominal aorta is otherwise grossly unremarkable. The inferior vena cava is grossly unremarkable. No retroperitoneal lymphadenopathy is seen. No pelvic sidewall lymphadenopathy is identified. Reproductive: The bladder is mildly distended and grossly unremarkable. The patient is status post hysterectomy. No suspicious adnexal masses are seen. Other: No additional soft tissue abnormalities are seen. Musculoskeletal: No acute osseous abnormalities are identified. Bilateral hip arthroplasties are grossly unremarkable in appearance, though incompletely imaged. The visualized musculature is unremarkable in appearance. IMPRESSION: 1. Cecum filled with stool, with additional  stool noted along the colon. This may reflect mild constipation. 2. Pneumatosis suggested along the sigmoid colon, of uncertain significance. 3. Scattered cystic lesions within the liver, measuring up to 2.4 cm, are likely benign. 4. Scattered coronary artery calcifications. Aortic Atherosclerosis (ICD10-I70.0). Electronically Signed   By: Garald Balding M.D.   On: 01/29/2018 01:59      IMPRESSION AND PLAN:   A/P: 53F p/w N/V, found to have severe hyponatremia, hypokalemia, constipation.  1.) N/V/hyponatremia/hypokalemia: Pt p/w 2d Hx bilious/brown vomiting, poor PO intake. Na+ 109, K+ 2.9 (decreased from 137 and 3.7, respectively as of 10/11/2017). I suspect these changes to most likely be subacute/chronic  with regards to timeframe, and probably due to poor intake in the setting of progressive dementia. Pt appears to be at her baseline mental status at the time of examination. She has poor memory and is not able to answer my questions, but responds appropriately, and does not appear to exhibit new neurological symptoms or demonstrate any new focal neurological deficits. She does not have symptoms suggestive of cerebral edema and/or increased ICP. Received NS bolus in ED, c/w LR @ 50cc/hr. Monitor BMP q8h-q12h, Na+ goal correction rate 29mEq/24hrs. Monitor for osmotic demyelination syndrome. Neuro checks, seizure precautions. Regular diet. Consider Ensure/Boost, Nutrition consult once Na+ improving. Replete K+, Mag level pending.  2.) Leukocytosis/microcytic anemia/throbocytosis: WBC 15.7. (-) fever, tachycardia, tachypnea, hypoxia, SIRS (-). U/A (-), CT A/P (-) acute intrabdominal infectious process (reviewed by surgeon, reported "pneumatosis" unimpressive). Low suspicion for active infxn, no evidence of perforated viscus. WBC elevation possibly 2/2 vomiting, stress demargination, though it may also due to anemia and thrombocytosis. Hgb 10.5, MCV 70.3. Documented Hx IDA likely cause, will likely benefit  from IV iron infusion as outpt. Plt 794. Pt followed by Hematology as outpt.  3.) HTN: c/w Amlodipine.  4.) HLD: c/w Lipitor.  5.) GERD: c/w Protonix.  6.) Asthma: Dulera as formulary substitution for home Symbicort.  7.) DJD/OA: c/w Fentanyl patch, Percocet PRN.  8.) Anx/dep: Holding Celexa.  9.) Alzheimer's dementia: c/w Aricept.  10.) Constipation: c/w Amitiza. CT A/P (+) fecal impaction, ordered for SMOG enema.  11.) FEN/GI: Regular diet, IVF LR, Protonix.  12.) DVT PPx: Lovenox 40mg  SQ qD.  13.) Code status: DNR/DNI per husband.  14.) Disposition: Admission, pt expected to stay > 2 midnights.  All the records are reviewed and case discussed with ED provider. Management plans discussed with the patient, family and they are in agreement.  CODE STATUS: DNR/DNI.  TOTAL TIME TAKING CARE OF THIS PATIENT: 90 minutes.    Arta Silence M.D on 01/29/2018 at 4:32 AM  Between 7am to 6pm - Pager - [phone]  After 6pm go to [email or web address] - password [email or web address] [facility]  [employer] [place] [facility]  Office  [phone]  CC: Primary care physician; Tonia Ghent, MD   Note: This dictation was prepared with Dragon dictation along with smaller phrase technology. Any transcriptional errors that result from this process are unintentional.

## 2018-01-29 NOTE — Consult Note (Signed)
[facility] CONSULT NOTE    Date: 01/29/2018                  Patient Name:  Aimee Jones  MRN: [id number]  DOB: 1945-10-24  Age / Sex: 72 y.o., female         PCP: Tonia Ghent, MD                 Service Requesting Consult: Hospitalist                 Reason for Consult: Acute hyponatremia            History of Present Illness: Patient is a 72 y.o. female with a PMHx of anxiety, chronic pain syndrome on opiates, chronic cervical spondylosis, depression, dementia, diverticulosis, GERD, hyperlipidemia, hypertension, prior history of CVA, who was admitted to [facility] on 01/29/2018 for evaluation of nausea, vomiting, poor p.o. Intake.  She has had nausea and vomiting over the past several days prior to admission.  In addition she has had very poor p.o. intake at home over the same period of time.  She apparently has been on a new pain medication over the past 6 weeks.  She has had recent constipation as well.  Upon presentation her serum sodium was quite low at 109.  With IV fluid hydration serum sodium is up to 113.  Potassium low at 2.6.  She is not had a bowel movement in several days as well.   Medications: Outpatient medications: Medications Prior to Admission  Medication Sig Dispense Refill Last Dose  . acetaminophen (TYLENOL) 325 MG tablet Take 2 tablets (650 mg total) by mouth 3 (three) times daily as needed.   Taking  . amLODipine (NORVASC) 2.5 MG tablet TAKE 1 TABLET BY MOUTH EVERY DAY 30 tablet 5   . atorvastatin (LIPITOR) 10 MG tablet TAKE 1 TABLET BY MOUTH EVERY DAY 90 tablet 1 Taking  . citalopram (CELEXA) 20 MG tablet TAKE 1 TABLET BY MOUTH EVERY DAY 90 tablet 1 Taking  . clopidogrel (PLAVIX) 75 MG tablet TAKE 1 TABLET BY MOUTH EVERY DAY 90 tablet 1 Taking  . diclofenac sodium (VOLTAREN) 1 % GEL Apply 4 g topically 4 (four) times daily as needed.   Taking  . donepezil (ARICEPT) 10 MG tablet TAKE 1 TABLET BY MOUTH EVERYDAY AT BEDTIME 90  tablet 1 Taking  . doxepin (SINEQUAN) 25 MG capsule TAKE 1 CAPSULE BY MOUTH EVERYDAY AT BEDTIME 90 capsule 1 Taking  . fentaNYL (DURAGESIC - DOSED MCG/HR) 25 MCG/HR patch Place 1 patch (25 mcg total) onto the skin every 3 (three) days.   Taking  . fluticasone (FLONASE) 50 MCG/ACT nasal spray Place into the nose.   Taking  . hydrocortisone 2.5 % cream APPLY RECTALLY 2 TIMES DAILY 28.35 g 0 Taking  . ibuprofen (MOTRIN IB) 200 MG tablet Take 2 tablets (400 mg total) by mouth 3 (three) times daily as needed (with food).   Taking  . lubiprostone (AMITIZA) 24 MCG capsule Take 24 mcg by mouth daily as needed for constipation.   Taking  . Multiple Vitamins-Minerals (EYE VITAMINS) CAPS Take 1 capsule by mouth daily.   Taking  . Omega-3 Fatty Acids (FISH OIL PO) Take by mouth.   Taking  . ondansetron (ZOFRAN) 8 MG tablet Take 8 mg by mouth every 8 (eight) hours as needed.    Taking  . ondansetron (ZOFRAN) 8 MG tablet TAKE 1 TABLET BY MOUTH EVERY 8 HOURS AS  NEEDED FOR NAUSEA AND VOMITING 60 tablet 3 Taking  . oxyCODONE-acetaminophen (PERCOCET) 10-325 MG tablet Take 1 tablet by mouth 3 (three) times daily.   Taking  . pantoprazole (PROTONIX) 40 MG tablet Take 1 tablet (40 mg total) by mouth daily. 90 tablet 3 Taking  . polyvinyl alcohol (LIQUIFILM TEARS) 1.4 % ophthalmic solution Place 1 drop into both eyes as needed.    Taking  . SYMBICORT 160-4.5 MCG/ACT inhaler INHALE 2 PUFFS INTO THE LUNGS 2 (TWO) TIMES DAILY AS NEEDED (SHORTNESS OF BREATH). 10.2 Inhaler 7 Taking  . vitamin E 400 UNIT capsule Take by mouth.   Taking  . Vitamins/Minerals TABS Take by mouth.   Taking    Current medications: Current Facility-Administered Medications  Medication Dose Route Frequency Provider Last Rate Last Dose  . 0.9 % NaCl with KCl 40 mEq / L  infusion   Intravenous Continuous Gladstone Lighter, MD      . acetaminophen (TYLENOL) tablet 650 mg  650 mg Oral Q6H PRN Arta Silence, MD       Or  . acetaminophen  (TYLENOL) suppository 650 mg  650 mg Rectal Q6H PRN Arta Silence, MD      . amLODipine (NORVASC) tablet 2.5 mg  2.5 mg Oral Daily Arta Silence, MD   2.5 mg at 01/29/18 1004  . atorvastatin (LIPITOR) tablet 10 mg  10 mg Oral Daily Arta Silence, MD   10 mg at 01/29/18 1005  . bisacodyl (DULCOLAX) suppository 10 mg  10 mg Rectal Daily PRN Gladstone Lighter, MD      . clopidogrel (PLAVIX) tablet 75 mg  75 mg Oral Daily Arta Silence, MD   75 mg at 01/29/18 1004  . donepezil (ARICEPT) tablet 10 mg  10 mg Oral QHS Sridharan, Prasanna, MD      . enoxaparin (LOVENOX) injection 40 mg  40 mg Subcutaneous Q24H Arta Silence, MD   40 mg at 01/29/18 0517  . fentaNYL (DURAGESIC - dosed mcg/hr) patch 25 mcg  25 mcg Transdermal Q72H Arta Silence, MD   25 mcg at 01/29/18 0557  . lubiprostone (AMITIZA) capsule 24 mcg  24 mcg Oral Daily PRN Arta Silence, MD      . mometasone-formoterol (DULERA) 200-5 MCG/ACT inhaler 2 puff  2 puff Inhalation BID Arta Silence, MD      . multivitamin-lutein (OCUVITE-LUTEIN) capsule 1 capsule  1 capsule Oral Daily Arta Silence, MD   1 capsule at 01/29/18 1006  . ondansetron (ZOFRAN) tablet 4 mg  4 mg Oral Q6H PRN Arta Silence, MD       Or  . ondansetron (ZOFRAN) injection 4 mg  4 mg Intravenous Q6H PRN Arta Silence, MD      . oxyCODONE-acetaminophen (PERCOCET/ROXICET) 5-325 MG per tablet 1 tablet  1 tablet Oral TID Arta Silence, MD   1 tablet at 01/29/18 1006   And  . oxyCODONE (Oxy IR/ROXICODONE) immediate release tablet 5 mg  5 mg Oral TID Arta Silence, MD   5 mg at 01/29/18 1005  . pantoprazole (PROTONIX) EC tablet 40 mg  40 mg Oral Daily Arta Silence, MD   40 mg at 01/29/18 1004  . polyethylene glycol (MIRALAX / GLYCOLAX) packet 17 g  17 g Oral Daily PRN Gladstone Lighter, MD      . potassium chloride 10 mEq in 100 mL IVPB  10 mEq Intravenous Q1 Hr x 2 Jodell Cipro, Prasanna, MD 100 mL/hr  at 01/29/18 1008 10 mEq at 01/29/18 1008  . potassium chloride SA (K-DUR,KLOR-CON) CR  tablet 40 mEq  40 mEq Oral BID Arta Silence, MD   40 mEq at 01/29/18 1007  . risperiDONE (RISPERDAL) 1 MG tablet           . risperiDONE (RISPERDAL) tablet 0.5 mg  0.5 mg Oral QHS PRN Arta Silence, MD   0.5 mg at 01/29/18 0612  . senna-docusate (Senokot-S) tablet 2 tablet  2 tablet Oral Daily Gladstone Lighter, MD      . sorbitol, milk of mag, mineral oil, glycerin (SMOG) enema  960 mL Rectal Once Arta Silence, MD          Allergies: Allergies  Allergen Reactions  . Tramadol Other (See Comments)    Would avoid.  History of seizure on medication  . Latex Rash  . Penicillins Rash    .Marland KitchenHas patient had a PCN reaction causing immediate rash, facial/tongue/throat swelling, SOB or lightheadedness with hypotension: No Has patient had a PCN reaction causing severe rash involving mucus membranes or skin necrosis: No Has patient had a PCN reaction that required hospitalization No Has patient had a PCN reaction occurring within the last 10 years: No If all of the above answers are "NO", then may proceed with Cephalosporin use.        Past Medical History: Past Medical History:  Diagnosis Date  . Allergy   . Anxiety    panic attacks- on occas.   . Arthritis    osteoporosis - especially hips.   . Asthma   . Confusion   . Dementia   . Depression   . Diverticulosis   . ETOH abuse   . Falls   . GERD (gastroesophageal reflux disease)   . Hyperlipidemia   . Hypertension   . Hyponatremia 07/05/2015  . Memory loss   . Normal 24 hour ambulatory pH monitoring study    good acid suppression  . Observed seizure-like activity ([place]) 07/05/2015   observed by husband , Dr Doy Mince  . Seizures ([place])   . Shortness of breath   . Stroke [facility])      Past Surgical History: Past Surgical History:  Procedure Laterality Date  . ABDOMINAL HYSTERECTOMY    . BREAST SURGERY     3x- biposies    . COLONOSCOPY WITH PROPOFOL N/A 08/05/2015   Procedure: COLONOSCOPY WITH PROPOFOL;  Surgeon: [name], MD;  Location: [facility];  Service: Endoscopy;  Laterality: N/A;  . DOBUTAMINE STRESS ECHO  07/09/2001   normal  . ESOPHAGOGASTRODUODENOSCOPY  06/11/2006   normal  . ESOPHAGOGASTRODUODENOSCOPY (EGD) WITH PROPOFOL N/A 08/05/2015   Procedure: ESOPHAGOGASTRODUODENOSCOPY (EGD) WITH PROPOFOL;  Surgeon: [name], MD;  Location: [facility];  Service: Endoscopy;  Laterality: N/A;  . EYE SURGERY     cataracts removed, IOL- both eyes   . JOINT REPLACEMENT     bilateral hips  . LOOP RECORDER IMPLANT N/A 10/14/2014   Procedure: LOOP RECORDER IMPLANT;  Surgeon: Evans Lance, MD;  Location: [facility] CATH LAB;  Service: Cardiovascular;  Laterality: N/A;  . NASAL SINUS SURGERY  1990  . REVERSE SHOULDER ARTHROPLASTY Right 08/13/2014   Procedure: REVERSE SHOULDER ARTHROPLASTY;  Surgeon: Marin Shutter, MD;  Location: [place];  Service: Orthopedics;  Laterality: Right;  interscalene block  . SHOULDER ARTHROSCOPY Left   . TEE WITHOUT CARDIOVERSION N/A 10/14/2014   Procedure: TRANSESOPHAGEAL ECHOCARDIOGRAM (TEE);  Surgeon: Sueanne Margarita, MD;  Location: [place];  Service: Cardiovascular;  Laterality: N/A;  . TOTAL HIP ARTHROPLASTY     left 1991/right 2008  .  ULNAR NERVE TRANSPOSITION Right 08/01/2016   Procedure: RIGHT ULNAR NEUROPLASTY AT THE ELBOW;  Surgeon: Milly Jakob, MD;  Location: [place];  Service: Orthopedics;  Laterality: Right;     Family History: Family History  Problem Relation Age of Onset  . Heart block Mother   . Stroke Mother   . Heart attack Father   . Sudden death Brother   . Heart attack Brother   . Hypertension Brother   . Esophageal cancer Brother   . Stroke Brother   . Stomach cancer Neg Hx   . Colon cancer Neg Hx      Social History: Social History   Socioeconomic History  . Marital status: Married    Spouse name: Not on file   . Number of children: Not on file  . Years of education: Not on file  . Highest education level: Not on file  Occupational History  . Occupation: retired    Fish farm manager: RETIRED    Comment: [employer]  . Financial resource strain: Not on file  . Food insecurity:    Worry: Not on file    Inability: Not on file  . Transportation needs:    Medical: Not on file    Non-medical: Not on file  Tobacco Use  . Smoking status: Never Smoker  . Smokeless tobacco: Never Used  Substance and Sexual Activity  . Alcohol use: No    Alcohol/week: 0.0 oz  . Drug use: No  . Sexual activity: Yes  Lifestyle  . Physical activity:    Days per week: Not on file    Minutes per session: Not on file  . Stress: Not on file  Relationships  . Social connections:    Talks on phone: Not on file    Gets together: Not on file    Attends religious service: Not on file    Active member of club or organization: Not on file    Attends meetings of clubs or organizations: Not on file    Relationship status: Not on file  . Intimate partner violence:    Fear of current or ex partner: Not on file    Emotionally abused: Not on file    Physically abused: Not on file    Forced sexual activity: Not on file  Other Topics Concern  . Not on file  Social History Narrative   Married. Retired.       Review of Systems: Patient confused and cannot provide review of systems  Vital Signs: Blood pressure 131/69, pulse 70, temperature 97.9 F (36.6 C), temperature source Oral, resp. rate 18, height 5\' 7"  (1.702 m), weight 58.1 kg (128 lb), SpO2 96 %.  Weight trends: Filed Weights   01/28/18 2335  Weight: 58.1 kg (128 lb)    Physical Exam: General: NAD, laying in bed  Head: Normocephalic, atraumatic.  Eyes: Anicteric, EOMI  Nose: Mucous membranes dry, not inflammed, nonerythematous.  Throat: Oropharynx nonerythematous, no exudate appreciated. Oral mucosa dry  Neck: Supple, trachea midline.  Lungs:   Normal respiratory effort. Clear to auscultation BL without crackles or wheezes.  Heart: RRR. S1 and S2 normal without gallop, murmur, or rubs.  Abdomen:  BS normoactive. Soft, Nondistended, non-tender.  No masses or organomegaly.  Extremities: No pretibial edema.  Neurologic: Awake, alert, will follow simple commands  Skin: No visible rashes, scars.    Lab results: Basic Metabolic Panel: Recent Labs  Lab 01/28/18 2340 01/29/18 0518  NA 109* 113*  K 2.9*  2.6*  CL 74* 81*  CO2 23 21*  GLUCOSE 111* 94  BUN 12 11  CREATININE 0.57 0.48  CALCIUM 8.7* 8.3*  MG  --  1.4*  PHOS  --  1.9*    Liver Function Tests: Recent Labs  Lab 01/28/18 2340  AST 25  ALT 14  ALKPHOS 61  BILITOT 0.4  PROT 6.4*  ALBUMIN 3.6   Recent Labs  Lab 01/28/18 2340  LIPASE 19   No results for input(s): AMMONIA in the last 168 hours.  CBC: Recent Labs  Lab 01/28/18 2340 01/29/18 0518  WBC 15.7* 13.2*  HGB 10.5* 10.3*  HCT 28.8* 31.1*  MCV 70.3* 70.6*  PLT 794* 873*    Cardiac Enzymes: No results for input(s): CKTOTAL, CKMB, CKMBINDEX, TROPONINI in the last 168 hours.  BNP: Invalid input(s): POCBNP  CBG: No results for input(s): GLUCAP in the last 168 hours.  Microbiology: Results for orders placed or performed during the hospital encounter of 09/24/17  Blood Culture (routine x 2)     Status: None   Collection Time: 09/24/17  3:49 PM  Result Value Ref Range Status   Specimen Description   Final    BLOOD Blood Culture results may not be optimal due to an inadequate volume of blood received in culture bottles   Special Requests LEFT ANTECUBITAL  Final   Culture NO GROWTH 5 DAYS  Final   Report Status 09/29/2017 FINAL  Final  Blood Culture (routine x 2)     Status: None   Collection Time: 09/24/17  4:39 PM  Result Value Ref Range Status   Specimen Description BLOOD LEFT AC  Final   Special Requests   Final    BOTTLES DRAWN AEROBIC AND ANAEROBIC Blood Culture adequate volume    Culture NO GROWTH 5 DAYS  Final   Report Status 09/29/2017 FINAL  Final  C difficile quick scan w PCR reflex     Status: Abnormal   Collection Time: 09/25/17  8:44 PM  Result Value Ref Range Status   C Diff antigen POSITIVE (A) NEGATIVE Final   C Diff toxin NEGATIVE NEGATIVE Final   C Diff interpretation Results are indeterminate. See PCR results.  Final  Clostridium Difficile by PCR     Status: Abnormal   Collection Time: 09/25/17  8:44 PM  Result Value Ref Range Status   Toxigenic C. Difficile by PCR POSITIVE (A) NEGATIVE Final    Comment: Positive for toxigenic C. difficile with little to no toxin production. Only treat if clinical presentation suggests symptomatic illness.    Coagulation Studies: No results for input(s): LABPROT, INR in the last 72 hours.  Urinalysis: Recent Labs    01/28/18 0050  COLORURINE YELLOW*  LABSPEC 1.017  PHURINE 6.0  GLUCOSEU NEGATIVE  HGBUR NEGATIVE  BILIRUBINUR NEGATIVE  KETONESUR 20*  PROTEINUR NEGATIVE  NITRITE NEGATIVE  LEUKOCYTESUR NEGATIVE      Imaging: Ct Abdomen Pelvis W Contrast  Result Date: 01/29/2018 CLINICAL DATA:  Acute onset of nausea, vomiting and diarrhea. EXAM: CT ABDOMEN AND PELVIS WITH CONTRAST TECHNIQUE: Multidetector CT imaging of the abdomen and pelvis was performed using the standard protocol following bolus administration of intravenous contrast. CONTRAST:  71mL OMNIPAQUE IOHEXOL 300 MG/ML  SOLN COMPARISON:  CT of the abdomen and pelvis performed 09/24/2017, and MRCP performed 09/27/2017. Right upper quadrant ultrasound performed 09/26/2017 FINDINGS: Lower chest: Scattered coronary artery calcifications are seen. The visualized lung bases are clear. Hepatobiliary: Scattered cystic lesions noted within the liver, measuring up  to 2.4 cm in size. The liver is otherwise unremarkable. The gallbladder is unremarkable in appearance. The common bile duct remains normal in caliber. Pancreas: The pancreas is within normal limits.  Spleen: The spleen is unremarkable in appearance. Adrenals/Urinary Tract: The adrenal glands are unremarkable in appearance. The kidneys are within normal limits. There is no evidence of hydronephrosis. No renal or ureteral stones are identified. No perinephric stranding is seen. Stomach/Bowel: The cecum is filled with stool. The appendix is not definitely seen; there is no evidence for appendicitis. Pneumatosis is suggested along the sigmoid colon. The small bowel is grossly unremarkable. The stomach is within normal limits. Vascular/Lymphatic: Scattered calcification is seen along the abdominal aorta and its branches. The abdominal aorta is otherwise grossly unremarkable. The inferior vena cava is grossly unremarkable. No retroperitoneal lymphadenopathy is seen. No pelvic sidewall lymphadenopathy is identified. Reproductive: The bladder is mildly distended and grossly unremarkable. The patient is status post hysterectomy. No suspicious adnexal masses are seen. Other: No additional soft tissue abnormalities are seen. Musculoskeletal: No acute osseous abnormalities are identified. Bilateral hip arthroplasties are grossly unremarkable in appearance, though incompletely imaged. The visualized musculature is unremarkable in appearance. IMPRESSION: 1. Cecum filled with stool, with additional stool noted along the colon. This may reflect mild constipation. 2. Pneumatosis suggested along the sigmoid colon, of uncertain significance. 3. Scattered cystic lesions within the liver, measuring up to 2.4 cm, are likely benign. 4. Scattered coronary artery calcifications. Aortic Atherosclerosis (ICD10-I70.0). Electronically Signed   By: Garald Balding M.D.   On: 01/29/2018 01:59      Assessment & Plan: Pt is a 72 y.o. female with a PMHx of anxiety, chronic pain syndrome on opiates, chronic cervical spondylosis, depression, dementia, diverticulosis, GERD, hyperlipidemia, hypertension, prior history of CVA, who was admitted  to [facility] on 01/29/2018 for evaluation of nausea, vomiting, poor p.o. Intake.   1.  Hyponatremia secondary to nausea, vomiting, poor p.o. intake. 2.  Hypokalemia. 3.  Constipation. 4.  Hypomagnesemia.  Plan:  We will consulted for evaluation management of hyponatremia in the setting of nausea, vomiting, poor p.o. intake.  The patient started a new opiate many weeks ago.  She is now also found to have significant constipation which may be contributing to her nausea, vomiting, and poor p.o. intake.  Serum sodium is coming up with IV fluid hydration.  Agree with current IV fluid rate.  Stop IV fluids if sodium reaches 119 within the next 24 hours.  Agree with efforts at repletion of potassium.  We will also order magnesium sulfate 2 g IV x1.  Treatment of constipation per hospitalist.  Thanks for consultation.

## 2018-01-29 NOTE — ED Notes (Signed)
Pt cleaned and changed. Husband at bedside

## 2018-01-29 NOTE — Progress Notes (Signed)
   [facility] Day: 0 days Aimee Jones is a 72 y.o. female presenting with Emesis .   Advance care planning discussed with patient  And her husband at bedside. All questions in regards to overall condition and expected prognosis answered. The decision was made to continue current code status  CODE STATUS: DNR Time spent: 18 minutes

## 2018-01-30 ENCOUNTER — Inpatient Hospital Stay: Payer: Medicare Other

## 2018-01-30 DIAGNOSIS — E43 Unspecified severe protein-calorie malnutrition: Secondary | ICD-10-CM

## 2018-01-30 LAB — BASIC METABOLIC PANEL
ANION GAP: 4 — AB (ref 5–15)
Anion gap: 7 (ref 5–15)
BUN: 12 mg/dL (ref 6–20)
BUN: 14 mg/dL (ref 6–20)
CHLORIDE: 87 mmol/L — AB (ref 101–111)
CO2: 23 mmol/L (ref 22–32)
CO2: 24 mmol/L (ref 22–32)
Calcium: 8.2 mg/dL — ABNORMAL LOW (ref 8.9–10.3)
Calcium: 8.4 mg/dL — ABNORMAL LOW (ref 8.9–10.3)
Chloride: 91 mmol/L — ABNORMAL LOW (ref 101–111)
Creatinine, Ser: 0.57 mg/dL (ref 0.44–1.00)
Creatinine, Ser: 0.57 mg/dL (ref 0.44–1.00)
GFR calc Af Amer: >60 mL/min (ref >60)
GFR calc non Af Amer: >60 mL/min (ref >60)
GFR calc non Af Amer: >60 mL/min (ref >60)
GLUCOSE: 101 mg/dL — AB (ref 65–99)
Glucose, Bld: 97 mg/dL (ref 65–99)
POTASSIUM: 4.1 mmol/L (ref 3.5–5.1)
Potassium: 4.4 mmol/L (ref 3.5–5.1)
Sodium: 114 mmol/L — CL (ref 135–145)
Sodium: 122 mmol/L — ABNORMAL LOW (ref 135–145)

## 2018-01-30 LAB — PHOSPHORUS: Phosphorus: 2.5 mg/dL (ref 2.5–4.6)

## 2018-01-30 LAB — SODIUM, URINE, RANDOM

## 2018-01-30 LAB — CORTISOL: CORTISOL PLASMA: 13 ug/dL

## 2018-01-30 LAB — OSMOLALITY, URINE: Osmolality, Ur: 188 mOsm/kg — ABNORMAL LOW (ref 300–900)

## 2018-01-30 LAB — SODIUM
Sodium: 121 mmol/L — ABNORMAL LOW (ref 135–145)
Sodium: 122 mmol/L — ABNORMAL LOW (ref 135–145)

## 2018-01-30 LAB — TSH: TSH: 1.376 u[IU]/mL (ref 0.350–4.500)

## 2018-01-30 LAB — MAGNESIUM: Magnesium: 1.7 mg/dL (ref 1.7–2.4)

## 2018-01-30 LAB — URIC ACID: Uric Acid, Serum: 1.6 mg/dL — ABNORMAL LOW (ref 2.3–6.6)

## 2018-01-30 LAB — OSMOLALITY: Osmolality: 252 mOsm/kg — ABNORMAL LOW (ref 275–295)

## 2018-01-30 MED ORDER — MORPHINE SULFATE (PF) 2 MG/ML IV SOLN
1.0000 mg | INTRAVENOUS | Status: DC | PRN
  Administered 2018-01-30 – 2018-02-01 (×4): 1 mg via INTRAVENOUS
  Filled 2018-01-30 (×6): qty 1

## 2018-01-30 MED ORDER — K PHOS MONO-SOD PHOS DI & MONO 155-852-130 MG PO TABS
500.0000 mg | ORAL_TABLET | ORAL | Status: AC
  Administered 2018-01-30 (×2): 500 mg via ORAL
  Filled 2018-01-30 (×2): qty 2

## 2018-01-30 MED ORDER — ENSURE ENLIVE PO LIQD
237.0000 mL | Freq: Two times a day (BID) | ORAL | Status: DC
  Administered 2018-01-31: 237 mL via ORAL

## 2018-01-30 MED ORDER — MAGNESIUM SULFATE 2 GM/50ML IV SOLN
2.0000 g | Freq: Once | INTRAVENOUS | Status: AC
  Administered 2018-01-30: 2 g via INTRAVENOUS
  Filled 2018-01-30: qty 50

## 2018-01-30 MED ORDER — DEXTROSE 5 % IV SOLN: INTRAVENOUS | Status: AC

## 2018-01-30 MED ORDER — SODIUM CHLORIDE 3 % IV SOLN
INTRAVENOUS | Status: DC
  Administered 2018-01-30: 35 mL/h via INTRAVENOUS
  Filled 2018-01-30: qty 500

## 2018-01-30 MED ORDER — ADULT MULTIVITAMIN W/MINERALS CH
1.0000 | ORAL_TABLET | Freq: Every day | ORAL | Status: DC
  Administered 2018-01-31 – 2018-02-01 (×2): 1 via ORAL
  Filled 2018-01-30 (×2): qty 1

## 2018-01-30 NOTE — Consult Note (Signed)
PHARMACY RELATED CONSULT NOTE - INITIAL   Pharmacy Consult for electrolyte monitoring and replacement   Labs: Recent Labs    01/28/18 2340 01/29/18 0518 01/29/18 1550 01/30/18 0319  WBC 15.7* 13.2*  --   --   HGB 10.5* 10.3*  --   --   HCT 28.8* 31.1*  --   --   PLT 794* 873*  --   --   CREATININE 0.57 0.48 0.51 0.57  MG  --  1.4*  --  1.7  PHOS  --  1.9*  --  2.5  ALBUMIN 3.6  --   --   --   PROT 6.4*  --   --   --   AST 25  --   --   --   ALT 14  --   --   --   ALKPHOS 61  --   --   --   BILITOT 0.4  --   --   --    Potassium (mmol/L)  Date Value  01/30/2018 4.1   Magnesium (mg/dL)  Date Value  01/30/2018 1.7   Phosphorus (mg/dL)  Date Value  01/30/2018 2.5  ] Estimated Creatinine Clearance: 58.3 mL/min (by C-G formula based on SCr of 0.57 mg/dL).  Assessment: Pharmacy consulted for electrolyte monitoring in 72 yo female admitted with hyponatremia secondary to N/V and poor PO intake. On admission patient had hypokalemia and hypomagnesemia.   Goal of Therapy:  K: 3.5-5 Mg: 1.8-2.4 Phos: 2.5-4.6  Plan:  Patient is receiving NaCL w/4mEq of KCL @ 44ml/hr.   She has already received KCL 65mEq IV x and KCL 23mq PO X 2 doses.  Magnesium 2gm IV also given.   Will order Phos-Nak 2 packets every 4 hours x 2 doses.   4/17 03:19 K 4.1, Ca 8.2 (albumin 01/28/18 3.6), Mg 1.7, phos 2.5. Patient remains on NS with 40 mEq/L at 60 mL/hr. Will order additional magnesium sulfate 2 gm IV x 1, and K Phos Neutra tabs 500 mg po Q4H x 2 doses. I will not replace calcium at this time due to reported constipation.   Recheck all electrolytes with AM labs.   Laural Benes, PharmD, BCPS Clinical Pharmacist 01/30/2018 8:09 AM

## 2018-01-30 NOTE — Progress Notes (Signed)
[facility] NAME: Aimee Jones    MR#:  [id number]  DATE OF BIRTH:  [birth year]  SUBJECTIVE:  CHIEF COMPLAINT:   Chief Complaint  Patient presents with  . Emesis  lethargic and sleepy, Na 122. Husband at bedside  REVIEW OF SYSTEMS:  Review of Systems  Constitutional: Negative for chills, fever and malaise/fatigue.  HENT: Negative for congestion, ear discharge, hearing loss and nosebleeds.   Eyes: Negative for blurred vision and double vision.  Respiratory: Negative for cough, shortness of breath and wheezing.   Cardiovascular: Negative for chest pain and palpitations.  Gastrointestinal: Positive for nausea and vomiting. Negative for abdominal pain, constipation and diarrhea.  Genitourinary: Negative for dysuria.  Musculoskeletal: Positive for back pain, myalgias and neck pain.  Neurological: Negative for dizziness, focal weakness, seizures, weakness and headaches.  Psychiatric/Behavioral: Negative for depression.   DRUG ALLERGIES:   Allergies  Allergen Reactions  . Tramadol Other (See Comments)    Would avoid.  History of seizure on medication  . Latex Rash  . Penicillins Rash    .Marland KitchenHas patient had a PCN reaction causing immediate rash, facial/tongue/throat swelling, SOB or lightheadedness with hypotension: No Has patient had a PCN reaction causing severe rash involving mucus membranes or skin necrosis: No Has patient had a PCN reaction that required hospitalization No Has patient had a PCN reaction occurring within the last 10 years: No If all of the above answers are "NO", then may proceed with Cephalosporin use.      VITALS:  Blood pressure 136/67, pulse 79, temperature 97.8 F (36.6 C), temperature source Oral, resp. rate 16, height 5\' 7"  (1.702 m), weight 58.1 kg (128 lb), SpO2 98 %.  PHYSICAL EXAMINATION:  Physical Exam  GENERAL:  72 y.o.-year-old elderly patient lying in the bed with no acute distress.    EYES: Pupils equal, round, reactive to light and accommodation. No scleral icterus. Extraocular muscles intact.  HEENT: Head atraumatic, normocephalic. Oropharynx and nasopharynx clear.  NECK:  Supple, no jugular venous distention. No thyroid enlargement, no tenderness.  LUNGS: Normal breath sounds bilaterally, no wheezing, rales,rhonchi or crepitation. No use of accessory muscles of respiration.  CARDIOVASCULAR: S1, S2 normal. No  rubs, or gallops. 2/6 systolic murmur present ABDOMEN: Soft, nontender, nondistended. Bowel sounds present. No organomegaly or mass.  EXTREMITIES: No pedal edema, cyanosis, or clubbing.  NEUROLOGIC: Cranial nerves II through XII are intact. Muscle strength 5/5 in all extremities. Sensation intact. Gait not checked.  PSYCHIATRIC: The patient is sleepy SKIN: No obvious rash, lesion, or ulcer.  LABORATORY PANEL:   CBC Recent Labs  Lab 01/29/18 0518  WBC 13.2*  HGB 10.3*  HCT 31.1*  PLT 873*   ------------------------------------------------------------------------------------------------------------------  Chemistries  Recent Labs  Lab 01/28/18 2340  01/30/18 0319  01/30/18 1654  NA 109*   < > 114*   < > 122*  122*  K 2.9*   < > 4.1  --  4.4  CL 74*   < > 87*  --  91*  CO2 23   < > 23  --  24  GLUCOSE 111*   < > 101*  --  97  BUN 12   < > 12  --  14  CREATININE 0.57   < > 0.57  --  0.57  CALCIUM 8.7*   < > 8.2*  --  8.4*  MG  --    < > 1.7  --   --  AST 25  --   --   --   --   ALT 14  --   --   --   --   ALKPHOS 61  --   --   --   --   BILITOT 0.4  --   --   --   --    < > = values in this interval not displayed.   ------------------------------------------------------------------------------------------------------------------  Cardiac Enzymes No results for input(s): TROPONINI in the last 168 hours. ------------------------------------------------------------------------------------------------------------------  RADIOLOGY:  Ct Chest  Wo Contrast  Result Date: 01/30/2018 CLINICAL DATA:  Hyponatremia.  Possible lung mass. EXAM: CT CHEST WITHOUT CONTRAST TECHNIQUE: Multidetector CT imaging of the chest was performed following the standard protocol without IV contrast. COMPARISON:  Radiograph of September 24, 2017. CT scan of July 27, 2005. FINDINGS: Cardiovascular: Atherosclerosis of thoracic aorta is noted without aneurysm formation. Coronary artery calcifications are noted. Normal cardiac size. Minimal pericardial effusion is noted. Mediastinum/Nodes: No enlarged mediastinal or axillary lymph nodes. Thyroid gland, trachea, and esophagus demonstrate no significant findings. Lungs/Pleura: No pneumothorax or pleural effusion is noted. Minimal bibasilar subsegmental atelectasis is noted. 4 mm nodule is noted in right lower lobe best seen on image number 87 of series 3. Upper Abdomen: No acute abnormality. Musculoskeletal: No chest wall mass or suspicious bone lesions identified. IMPRESSION: 4 mm nodule noted in right lower lobe. No follow-up needed if patient is low-risk. Non-contrast chest CT can be considered in 12 months if patient is high-risk. This recommendation follows the consensus statement: Guidelines for Management of Incidental Pulmonary Nodules Detected on CT Images: From the Fleischner Society 2017; Radiology 2017; 284:228-243. Coronary artery calcifications are noted suggesting coronary artery disease. Minimal pericardial effusion. Aortic Atherosclerosis (ICD10-I70.0). Electronically Signed   By: Marijo Conception, M.D.   On: 01/30/2018 13:07   Ct Abdomen Pelvis W Contrast  Result Date: 01/29/2018 CLINICAL DATA:  Acute onset of nausea, vomiting and diarrhea. EXAM: CT ABDOMEN AND PELVIS WITH CONTRAST TECHNIQUE: Multidetector CT imaging of the abdomen and pelvis was performed using the standard protocol following bolus administration of intravenous contrast. CONTRAST:  80mL OMNIPAQUE IOHEXOL 300 MG/ML  SOLN COMPARISON:  CT of the  abdomen and pelvis performed 09/24/2017, and MRCP performed 09/27/2017. Right upper quadrant ultrasound performed 09/26/2017 FINDINGS: Lower chest: Scattered coronary artery calcifications are seen. The visualized lung bases are clear. Hepatobiliary: Scattered cystic lesions noted within the liver, measuring up to 2.4 cm in size. The liver is otherwise unremarkable. The gallbladder is unremarkable in appearance. The common bile duct remains normal in caliber. Pancreas: The pancreas is within normal limits. Spleen: The spleen is unremarkable in appearance. Adrenals/Urinary Tract: The adrenal glands are unremarkable in appearance. The kidneys are within normal limits. There is no evidence of hydronephrosis. No renal or ureteral stones are identified. No perinephric stranding is seen. Stomach/Bowel: The cecum is filled with stool. The appendix is not definitely seen; there is no evidence for appendicitis. Pneumatosis is suggested along the sigmoid colon. The small bowel is grossly unremarkable. The stomach is within normal limits. Vascular/Lymphatic: Scattered calcification is seen along the abdominal aorta and its branches. The abdominal aorta is otherwise grossly unremarkable. The inferior vena cava is grossly unremarkable. No retroperitoneal lymphadenopathy is seen. No pelvic sidewall lymphadenopathy is identified. Reproductive: The bladder is mildly distended and grossly unremarkable. The patient is status post hysterectomy. No suspicious adnexal masses are seen. Other: No additional soft tissue abnormalities are seen. Musculoskeletal: No acute osseous abnormalities are identified. Bilateral  hip arthroplasties are grossly unremarkable in appearance, though incompletely imaged. The visualized musculature is unremarkable in appearance. IMPRESSION: 1. Cecum filled with stool, with additional stool noted along the colon. This may reflect mild constipation. 2. Pneumatosis suggested along the sigmoid colon, of uncertain  significance. 3. Scattered cystic lesions within the liver, measuring up to 2.4 cm, are likely benign. 4. Scattered coronary artery calcifications. Aortic Atherosclerosis (ICD10-I70.0). Electronically Signed   By: Garald Balding M.D.   On: 01/29/2018 01:59    EKG:   Orders placed or performed during the hospital encounter of 09/24/17  . EKG 12-Lead  . EKG 12-Lead  . EKG    ASSESSMENT AND PLAN:   72 year old female with advanced dementia, severe bowel obstruction, hypertension, history of hyponatremia, stroke, chronic neck pain and back pain presents to hospital secondary to nausea vomiting and constipation.  Noted to have hyponatremia.  1.  Acute on chronic hyponatremia-could be a combination of things.  Poor oral intake, increased free water intake.  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 -Appreciate nephrology consult.  IV fluids and monitor -Goal sodium increases 8-12 mEq per 24 hours. Na 122 today  2.  Hypokalemia-secondary to ileus, constipation and GI issues. -Being replaced  3.  Constipation-laxatives added.  Monitor closely -CT with no bowel obstruction noted -Questionable pneumatosis on the CT abdomen evaluated by surgeon on admission and does not think this is perforation  4.  hypertension-continue Norvasc  5.  Chronic pain-continue pain medications  6.  DVT prophylaxis-on Lovenox   Patient is followed by [facility] for Palliative care services. Husband is very much interested in keeping her comfortable as she is clinically worsening. If she qualifies for Hospice, he would be interested.   All the records are reviewed and case discussed with Care Management/Social Workerr. Management plans discussed with the patient, family and they are in agreement.  CODE STATUS: DNR  TOTAL TIME TAKING CARE OF THIS PATIENT: 18 minutes.   POSSIBLE D/C IN 1-2 DAYS, DEPENDING ON CLINICAL CONDITION.   Max Sane M.D on 01/30/2018 at 10:54 PM  Between 7am to  6pm - Pager - [phone]  After 6pm go to [email or web address] - password [email or web address] [facility]  Office  [phone]  CC: Primary care physician; Tonia Ghent, MD

## 2018-01-30 NOTE — Progress Notes (Signed)
Initial Nutrition Assessment  DOCUMENTATION CODES:   Severe malnutrition in context of chronic illness  INTERVENTION:  Provide Ensure Enlive po BID, each supplement provides 350 kcal and 20 grams of protein.  Provide daily MVI.  NUTRITION DIAGNOSIS:   Severe Malnutrition related to chronic illness(hx CVA, advanced dementia) as evidenced by moderate fat depletion, severe fat depletion, severe muscle depletion.  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   -On CT chest today 4 mm nodule found in right lower lobe.  Met with patient and husband at bedside. Most of history provided by husband in setting of patient's dementia. He reports that she has had a poor appetite for about 3 years now since her stroke. Her intake has slowly declined but at home she is only eating small bites/sips at meals. For example she may have a "potato patty" for breakfast and then one bite of a cheese burger at lunch and that would be all she would eat in a day. He provides her one Boost shake daily. Lately she has had N/V and has been very weak. He reports she is feeling better here and her intake is actually improving some. She also takes a daily MVI (Ocuvite).  Husband reports UBW was 180 lbs a while ago and that about 7-8 months ago she was 150 lbs but is lately around 125 lbs. Per chart she was 151.25 lbs on 03/02/2017 and 134.5 lbs on 12/13/2017. She has lost 16.75 lbs (11.1% body weight) over the past 11 months, which is not significant for time frame.  Medications reviewed  and include: Ocuvite daily, pantoprazole, senna-docusate, hypertonic saline (3%) at 35 mL/hr.  Labs reviewed: Sodium 121 (was 112 yesterday), Chloride 87, Anion gap 4.  NUTRITION - FOCUSED PHYSICAL EXAM:    Most Recent Value  Orbital Region  Severe depletion  Upper Arm Region  Moderate depletion  Thoracic and Lumbar Region  Moderate depletion  Buccal Region  Severe depletion  Temple Region  Severe depletion  Clavicle Bone Region  Severe depletion  Clavicle and Acromion Bone Region  Severe depletion  Scapular Bone Region  Severe depletion  Dorsal Hand  Severe depletion  Patellar Region  Severe depletion  Anterior Thigh Region  Severe depletion  Posterior Calf Region  Severe depletion  Edema (RD Assessment)  None  Hair  Reviewed  Eyes  Unable to assess  Mouth  Unable to assess  Skin  Reviewed  Nails  Reviewed     Diet Order:  Seizure precautions Diet regular Room service appropriate? Yes; Fluid consistency: Thin Seizure precautions  EDUCATION NEEDS:   No education needs have been identified at this time  Skin:  Skin Assessment: Reviewed RN Assessment  Last BM:  01/30/2018 - large type 7  Height:   Ht Readings from Last 1 Encounters:  01/28/18 5' 7"  (1.702 m)    Weight:   Wt Readings from Last 1 Encounters:  01/28/18 128 lb (58.1 kg)    Ideal Body Weight:  61.4 kg  BMI:  Body mass index is 20.05 kg/m.  Estimated Nutritional Needs:  Kcal:  1350-1580 (MSJ x 1.2-1.4)  Protein:  75-85 grams (1.3-1.5 grams/kg)  Fluid:  1.4 L/day (25 mL/kg)  Willey Blade, MS, RD, LDN Office: [phone] Pager: [phone] After Hours/Weekend Pager: [phone]

## 2018-01-30 NOTE — Consult Note (Signed)
Consultation Note Date: 01/30/2018   Patient Name: Aimee Jones  DOB: Jul 15, 1946  MRN: [id number]  Age / Sex: 72 y.o., female  PCP: Tonia Ghent, MD Referring Physician: Max Sane, MD  Reason for Consultation: Establishing goals of care  HPI/Patient Profile: 72 y.o. female  admitted on 01/29/2018 from home with nausea and vomiting x2 days per family. Patient has a past medical history significant for hypertension, IDA, essential thrombocytosis, asthma, DJD/OA, anxiety, depression, late stage dementia, GERD, and hyperlipidemia. Pt is disoriented and has severe memory impairment, and is not able to provide any pertinent Hx. Hx obtained from pt's husband (DPOA) at bedside. Per spouse patient typically has complaints of chronic B/L shoulder pain (2/2 cervical spondylosis/radiculopathy), and a chronic headache (characterized as pain behind the eyes). He reports prior to admission she had thrown up multiple times per day, though he is unable to quantify. Vomitus was characterized as bilious and brown, no hematemesis noted. The pt's husband states she had not eaten food for about 2 days prior, but was drinking plenty of water. She has been constipated. There have been no reports of fever, chills, diarrhea, AP, CP, SOB, palpitations, diaphoresis, rigors, night sweats, blurred vision, focal neurological deficits, LH/LOC, urinary symptoms. Pt is DNR/DNI per husband. Since admission she was found to have acute on chronic hyponatremia and constipation. She has received fluid replacements and laxatives for correction. Patient is currently receiving Palliative services at home with Hospice and [facility]. Palliative Medicine consulted for goals of care discussion.   Clinical Assessment and Goals of Care: I have reviewed medical records including lab results, imaging, Epic notes, and MAR, received report  from the bedside RN, and assessed the patient. I then met at the bedside along with Gwyndolyn Saxon (husband/HCPOA)  to discuss diagnosis prognosis, GOC, EOL wishes, disposition and options. Aimee Jones is alert, however due to advanced dementia she is alert to self and family only. She is unable to participate effectively in goals of care discussion.   I re-introduced Palliative Medicine as specialized medical care for people living with serious illness. It focuses on providing relief from the symptoms and stress of a serious illness. The goal is to improve quality of life for both the patient and the family. Again they have been receiving Palliative services in the home for some time now.   We discussed a brief life review of the patient.  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  He then retired in 1998 due to health issues such as chronic pain and fibromyalgia. They have been married for almost 57 years and have a daughter and a son who both live locally and of great support. They have 3 grandchildren which means the world to them.  Family is of [place] faith.  Husband states once she retired they have took their investments that were saved and purchased a RV and travel the world.  They would travel the world and stay for months  while site seeing.  They have visited places such as the [place],  [place], and [place].   As far as functional and nutritional status husband reports she has not been eating well for almost 3 years now, and believes she has lost 50-60lb during this time period. He states that she has also been having a staggering gait for the last 1-2 months, and seems to be gradually worsening. He has been helping her ambulate over the past several months to prevent her from falling.  He reports prior to 2 months ago the patient was independently ambulating without any assistance or devices.  He also notices  over the past several months that she is requiring more assistance to perform her ADLs.  Husband also has noticed an increase in agitation at times, more particularly when he is not around patient or patient is left alone or unable to visualize family members for brief periods of time.    We discussed her current illness and what it means in the larger context of her on-going co-morbidities.  Natural disease trajectory and expectations at EOL were discussed.  We discussed and particularly signs of advanced dementia and what that currently looks like and how it would most likely look like in the near to upcoming future.  We discussed that patient may begin to exhibit more signs of anxiety and agitation during the day and/or even more during the evening and nighttime hours, that patient may continue to have a decrease in appetite or began to refuse food, we discussed patient may become a high risk for aspiration due to swallowing difficulties, patient may become weaker and gait may become more increasingly unsteady with a higher risk of falls despite assistive devices or assistance, patient would become harder to get out of bed or to transfer, patient may began sleeping more often or for longer periods of times. Husband verbalized standing and stated that he has noticed some of the signs over the past 2-3 months.   I attempted to elicit values and goals of care important to the patient.    The difference between aggressive medical intervention and comfort care was considered in light of the patient's goals of care.  Husband verbalized understanding.  Patient verbalized that her goal was to keep her at home and for her to be as comfortable as possible around family and friends.  We discussed what comfort care would look like in the home.  This would mean treating the patient in her home for any symptoms such as anxiety, agitation, nausea, vomiting, pain, constipation, and shortness of breath.  The ultimate goal  would be to keep patient in the home versus transporting her to a hospital facility without use of the aggressive measures such as IV fluids antibiotics, unnecessary lab/radiology test,  or other life-sustaining measures.  Husband verbalized understanding.  Advanced directives, concepts specific to code status, artifical feeding and hydration, and rehospitalization were considered and discussed.  Husband reports patient is a DNR/DNI.   Hospice and Palliative Care services outpatient were explained and offered.  Husband verbalized understanding of the difference between palliative and hospice care services. We discussed life expectancy of 6 months or less knowing that ultimately as providers we do not have the final answer but can only estimate a projection based on disease trajectory and patient's current health state. Husband verbalized understanding and states he is aware and he could visually see the decline and "knew her time was winding down."  At this time his request is  to transition her care from palliative services to hospice services.  He also mentioned that he had been researching additional resources such as CNA services to provide him extra support as both of their children are busy with their work schedules and family and could not be there as much as they would like.   Questions and concerns were addressed.  The family was encouraged to call with questions or concerns.  PMT will continue to support holistically.  HCPOA-William Macqueen (Husband)    SUMMARY OF RECOMMENDATIONS    DNR/DNI  At family's request continue to treat the treatable while patient is hospitalized.  With goal of returning home and transitioning from palliative services to hospice services.  Case management consult for outpatient hospice services.  Agree with attending orders for fentanyl patch and scheduled oxycodone regimen for pain.  Agree with Zofran and/or Compazine as needed for nausea and vomiting.  Agree  with Amitiza, Senokot, and Miralax for constipation.  Discussed with her husband the importance of her taking a supplement every day to prevent constipation given her use of opioids.  Agree with Ensure supplements as ordered by dietitian for nutritional support.   Agree with Risperdal for insomnia/agitation.  Palliative medicine will continue to support patient, family, and medical team during hospitalization.  Code Status/Advance Care Planning:  DNR/DNI per family   Symptom Management:   Constipation: Amitiza as needed, scheduled Senokot and Miralax  Nausea: Zofran and/or Compazine as needed  Insomnia: Risperdal as needed  Pain: Fentanyl patch 25 mcg and scheduled oxycodone  Agitation/anxiety: Could consider low-dose Zyprexa 2.5 mg nightly or Haldol p.o. If Risperdal does not help.  Palliative Prophylaxis:   Aspiration, Delirium Protocol, Frequent Pain Assessment and Oral Care  Additional Recommendations (Limitations, Scope, Preferences): Full Scope Treatment -continue to treat the treatable during hospitalization   Psycho-social/Spiritual:   Desire for further Chaplaincy support:NO  Prognosis:   < 6 months the setting of advanced dementia, poor p.o. Intake, decreased activity, increased weakness, and chronic pain  Discharge Planning: Home with Hospice per family's request.      Primary Diagnoses: Present on Admission: . Hyponatremia   I have reviewed the medical record, interviewed the patient and family, and examined the patient. The following aspects are pertinent.  Past Medical History:  Diagnosis Date  . Allergy   . Anxiety    panic attacks- on occas.   . Arthritis    osteoporosis - especially hips.   . Asthma   . Confusion   . Dementia   . Depression   . Diverticulosis   . ETOH abuse   . Falls   . GERD (gastroesophageal reflux disease)   . Hyperlipidemia   . Hypertension   . Hyponatremia 07/05/2015  . Memory loss   . Normal 24 hour  ambulatory pH monitoring study    good acid suppression  . Observed seizure-like activity ([place]) 07/05/2015   observed by husband , Dr Doy Mince  . Seizures ([place])   . Shortness of breath   . Stroke [facility])    Social History   Socioeconomic History  . Marital status: Married    Spouse name: Not on file  . Number of children: Not on file  . Years of education: Not on file  . Highest education level: Not on file  Occupational History  . Occupation: retired    Fish farm manager: RETIRED    Comment: [employer]  . Financial resource strain: Not on file  . Food insecurity:    Worry:  Not on file    Inability: Not on file  . Transportation needs:    Medical: Not on file    Non-medical: Not on file  Tobacco Use  . Smoking status: Never Smoker  . Smokeless tobacco: Never Used  Substance and Sexual Activity  . Alcohol use: No    Alcohol/week: 0.0 oz  . Drug use: No  . Sexual activity: Yes  Lifestyle  . Physical activity:    Days per week: Not on file    Minutes per session: Not on file  . Stress: Not on file  Relationships  . Social connections:    Talks on phone: Not on file    Gets together: Not on file    Attends religious service: Not on file    Active member of club or organization: Not on file    Attends meetings of clubs or organizations: Not on file    Relationship status: Not on file  Other Topics Concern  . Not on file  Social History Narrative   Married. Retired.     Family History  Problem Relation Age of Onset  . Heart block Mother   . Stroke Mother   . Heart attack Father   . Sudden death Brother   . Heart attack Brother   . Hypertension Brother   . Esophageal cancer Brother   . Stroke Brother   . Stomach cancer Neg Hx   . Colon cancer Neg Hx    Scheduled Meds: . amLODipine  2.5 mg Oral Daily  . atorvastatin  10 mg Oral Daily  . clopidogrel  75 mg Oral Daily  . donepezil  10 mg Oral QHS  . enoxaparin (LOVENOX) injection  40 mg Subcutaneous  Q24H  . [START ON 01/31/2018] feeding supplement (ENSURE ENLIVE)  237 mL Oral BID BM  . fentaNYL  25 mcg Transdermal Q72H  . mometasone-formoterol  2 puff Inhalation BID  . [START ON 01/31/2018] multivitamin with minerals  1 tablet Oral Daily  . oxyCODONE-acetaminophen  1 tablet Oral TID   And  . oxyCODONE  5 mg Oral TID  . pantoprazole  40 mg Oral Daily  . senna-docusate  2 tablet Oral Daily   Continuous Infusions: . sodium chloride (hypertonic) Stopped (01/30/18 1517)   PRN Meds:.acetaminophen **OR** acetaminophen, bisacodyl, lubiprostone, ondansetron **OR** ondansetron (ZOFRAN) IV, polyethylene glycol, risperiDONE Medications Prior to Admission:  Prior to Admission medications   Medication Sig Start Date End Date Taking? Authorizing Provider  acetaminophen (TYLENOL) 325 MG tablet Take 2 tablets (650 mg total) by mouth 3 (three) times daily as needed. 11/13/16   Tonia Ghent, MD  amLODipine (NORVASC) 2.5 MG tablet TAKE 1 TABLET BY MOUTH EVERY DAY 01/16/18   Tonia Ghent, MD  atorvastatin (LIPITOR) 10 MG tablet TAKE 1 TABLET BY MOUTH EVERY DAY 10/22/17   Tonia Ghent, MD  citalopram (CELEXA) 20 MG tablet TAKE 1 TABLET BY MOUTH EVERY DAY 10/22/17   Tonia Ghent, MD  clopidogrel (PLAVIX) 75 MG tablet TAKE 1 TABLET BY MOUTH EVERY DAY 10/15/17   Tonia Ghent, MD  diclofenac sodium (VOLTAREN) 1 % GEL Apply 4 g topically 4 (four) times daily as needed.    [provider]  donepezil (ARICEPT) 10 MG tablet TAKE 1 TABLET BY MOUTH EVERYDAY AT BEDTIME 10/15/17   Tonia Ghent, MD  doxepin (SINEQUAN) 25 MG capsule TAKE 1 CAPSULE BY MOUTH EVERYDAY AT BEDTIME 10/24/17   Tonia Ghent, MD  fentaNYL ([place] - DOSED  MCG/HR) 25 MCG/HR patch Place 1 patch (25 mcg total) onto the skin every 3 (three) days. 10/11/17   Tonia Ghent, MD  fluticasone Asencion Islam) 50 MCG/ACT nasal spray Place into the nose. 03/16/15   [provider]  hydrocortisone 2.5 % cream APPLY RECTALLY  2 TIMES DAILY 12/14/16   Tonia Ghent, MD  ibuprofen (MOTRIN IB) 200 MG tablet Take 2 tablets (400 mg total) by mouth 3 (three) times daily as needed (with food). 11/13/16   Tonia Ghent, MD  lubiprostone (AMITIZA) 24 MCG capsule Take 24 mcg by mouth daily as needed for constipation.    [provider]  Multiple Vitamins-Minerals (EYE VITAMINS) CAPS Take 1 capsule by mouth daily.    [provider]  Omega-3 Fatty Acids (FISH OIL PO) Take by mouth.    [provider]  ondansetron (ZOFRAN) 8 MG tablet Take 8 mg by mouth every 8 (eight) hours as needed.  11/26/15   [provider]  ondansetron (ZOFRAN) 8 MG tablet TAKE 1 TABLET BY MOUTH EVERY 8 HOURS AS NEEDED FOR NAUSEA AND VOMITING 10/30/17   [name], MD  oxyCODONE-acetaminophen (PERCOCET) 10-325 MG tablet Take 1 tablet by mouth 3 (three) times daily.    [provider]  pantoprazole (PROTONIX) 40 MG tablet Take 1 tablet (40 mg total) by mouth daily. 10/17/17   Levin Erp, PA  polyvinyl alcohol (LIQUIFILM TEARS) 1.4 % ophthalmic solution Place 1 drop into both eyes as needed.     [provider]  SYMBICORT 160-4.5 MCG/ACT inhaler INHALE 2 PUFFS INTO THE LUNGS 2 (TWO) TIMES DAILY AS NEEDED (SHORTNESS OF BREATH). 02/05/17   Tonia Ghent, MD  vitamin E 400 UNIT capsule Take by mouth.    [provider]  Vitamins/Minerals TABS Take by mouth.    [provider]   Allergies  Allergen Reactions  . Tramadol Other (See Comments)    Would avoid.  History of seizure on medication  . Latex Rash  . Penicillins Rash    .Marland KitchenHas patient had a PCN reaction causing immediate rash, facial/tongue/throat swelling, SOB or lightheadedness with hypotension: No Has patient had a PCN reaction causing severe rash involving mucus membranes or skin necrosis: No Has patient had a PCN reaction that required hospitalization No Has patient had a PCN reaction occurring within the last  10 years: No If all of the above answers are "NO", then may proceed with Cephalosporin use.     Review of Systems  Constitutional: Positive for appetite change and unexpected weight change.  Musculoskeletal: Positive for back pain and myalgias.  Neurological: Positive for weakness.  Psychiatric/Behavioral: Positive for confusion.  All other systems reviewed and are negative.   Physical Exam  Constitutional: Vital signs are normal. She is cooperative.  Cardiovascular: Normal rate, regular rhythm, normal heart sounds, intact distal pulses and normal pulses.  Pulmonary/Chest: Effort normal. She has decreased breath sounds.  Abdominal: Soft. Normal appearance and bowel sounds are normal.  Musculoskeletal:  Generalized weakness   Neurological: She is alert. She is disoriented.  Hx of advanced dementia   Psychiatric: Her speech is normal. Cognition and memory are impaired. She expresses inappropriate judgment.  Nursing note and vitals reviewed.   Vital Signs: BP 136/67 (BP Location: Right Arm)   Pulse 79   Temp 97.8 F (36.6 C) (Oral)   Resp 16   Ht _0  (1.702 m)   Wt 58.1 kg (128 lb)   SpO2 98%   BMI  20.05 kg/m  Pain Scale: 0-10 POSS *See Group Information*: 1-Acceptable,Awake and alert Pain Score: Asleep   SpO2: SpO2: 98 % O2 Device:SpO2: 98 % O2 Flow Rate: .   IO: Intake/output summary:   Intake/Output Summary (Last 24 hours) at 01/30/2018 1700 Last data filed at 01/30/2018 1500 Gross per 24 hour  Intake 971 ml  Output -  Net 971 ml    LBM: Last BM Date: 01/30/18 Baseline Weight: Weight: 58.1 kg (128 lb) Most recent weight: Weight: 58.1 kg (128 lb)     Palliative Assessment/Data:PPS 40%   Time In: 1430 Time Out: 1545 Time Total: 75 min.  Greater than 50%  of this time was spent counseling and coordinating care related to the above assessment and plan.  Signed by: Alda Lea, NP-BC Palliative Medicine Team  Phone: [phone] Fax:  [phone]   Please contact Palliative Medicine Team phone at [phone] for questions and concerns.  For individual provider: See Shea Evans

## 2018-01-30 NOTE — Progress Notes (Signed)
PT Cancellation Note  Patient Details Name: Aimee Jones MRN: [id number] DOB: 04/29/1946   Cancelled Treatment:    Reason Eval/Treat Not Completed: Medical issues which prohibited therapy(Chart reviewed, Sodium remains outside of safe parameters for high exersion required for PT evaluation. Will continue to follow and attempt eval again once patient is medically ready. )  9:05 AM, 01/30/18 [name], PT, DPT Physical Therapist - [facility]  [phone] ([place])    Paxtyn Wisdom C 01/30/2018, 9:05 AM

## 2018-01-30 NOTE — Care Management (Signed)
Patient active with Palliative services with [facility]

## 2018-01-30 NOTE — Progress Notes (Signed)
[facility]  ROUNDING NOTE   Subjective:  Patient seen at bedside. Serum sodium remains quite low at 114. Husband at bedside. We talked about starting 3% saline.   Objective:  Vital signs in last 24 hours:  Temp:  [97.8 F (36.6 C)-98.9 F (37.2 C)] 97.8 F (36.6 C) (04/17 0741) Pulse Rate:  [73-79] 79 (04/17 0741) Resp:  [16-20] 16 (04/17 0741) BP: (107-136)/(67-68) 136/67 (04/17 0741) SpO2:  [96 %-98 %] 98 % (04/17 0741)  Weight change:  Filed Weights   01/28/18 2335  Weight: 58.1 kg (128 lb)    Intake/Output: I/O last 3 completed shifts: In: 2128 [P.O.:720; I.V.:358; IV Piggyback:1050] Out: -    Intake/Output this shift:  No intake/output data recorded.  Physical Exam: General: No acute distress  Head: Normocephalic, atraumatic. Moist oral mucosal membranes  Eyes: Anicteric  Neck: Supple, trachea midline  Lungs:  Clear to auscultation, normal effort  Heart: S1S2 no rubs  Abdomen:  Soft, nontender, bowel sounds present  Extremities: No peripheral edema.  Neurologic: Awake, alert, following commands  Skin: No lesions       Basic Metabolic Panel: Recent Labs  Lab 01/28/18 2340 01/29/18 0518 01/29/18 1550 01/30/18 0319  NA 109* 113* 112* 114*  K 2.9* 2.6* 3.7 4.1  CL 74* 81* 82* 87*  CO2 23 21* 24 23  GLUCOSE 111* 94 110* 101*  BUN 12 11 12 12   CREATININE 0.57 0.48 0.51 0.57  CALCIUM 8.7* 8.3* 8.0* 8.2*  MG  --  1.4*  --  1.7  PHOS  --  1.9*  --  2.5    Liver Function Tests: Recent Labs  Lab 01/28/18 2340  AST 25  ALT 14  ALKPHOS 61  BILITOT 0.4  PROT 6.4*  ALBUMIN 3.6   Recent Labs  Lab 01/28/18 2340  LIPASE 19   No results for input(s): AMMONIA in the last 168 hours.  CBC: Recent Labs  Lab 01/28/18 2340 01/29/18 0518  WBC 15.7* 13.2*  HGB 10.5* 10.3*  HCT 28.8* 31.1*  MCV 70.3* 70.6*  PLT 794* 873*    Cardiac Enzymes: No results for input(s): CKTOTAL, CKMB, CKMBINDEX, TROPONINI in the last 168  hours.  BNP: Invalid input(s): POCBNP  CBG: No results for input(s): GLUCAP in the last 168 hours.  Microbiology: Results for orders placed or performed during the hospital encounter of 09/24/17  Blood Culture (routine x 2)     Status: None   Collection Time: 09/24/17  3:49 PM  Result Value Ref Range Status   Specimen Description   Final    BLOOD Blood Culture results may not be optimal due to an inadequate volume of blood received in culture bottles   Special Requests LEFT ANTECUBITAL  Final   Culture NO GROWTH 5 DAYS  Final   Report Status 09/29/2017 FINAL  Final  Blood Culture (routine x 2)     Status: None   Collection Time: 09/24/17  4:39 PM  Result Value Ref Range Status   Specimen Description BLOOD LEFT AC  Final   Special Requests   Final    BOTTLES DRAWN AEROBIC AND ANAEROBIC Blood Culture adequate volume   Culture NO GROWTH 5 DAYS  Final   Report Status 09/29/2017 FINAL  Final  C difficile quick scan w PCR reflex     Status: Abnormal   Collection Time: 09/25/17  8:44 PM  Result Value Ref Range Status   C Diff antigen POSITIVE (A) NEGATIVE Final   C Diff  toxin NEGATIVE NEGATIVE Final   C Diff interpretation Results are indeterminate. See PCR results.  Final  Clostridium Difficile by PCR     Status: Abnormal   Collection Time: 09/25/17  8:44 PM  Result Value Ref Range Status   Toxigenic C. Difficile by PCR POSITIVE (A) NEGATIVE Final    Comment: Positive for toxigenic C. difficile with little to no toxin production. Only treat if clinical presentation suggests symptomatic illness.    Coagulation Studies: No results for input(s): LABPROT, INR in the last 72 hours.  Urinalysis: Recent Labs    01/28/18 0050  COLORURINE YELLOW*  LABSPEC 1.017  PHURINE 6.0  GLUCOSEU NEGATIVE  HGBUR NEGATIVE  BILIRUBINUR NEGATIVE  KETONESUR 20*  PROTEINUR NEGATIVE  NITRITE NEGATIVE  LEUKOCYTESUR NEGATIVE      Imaging: Ct Abdomen Pelvis W Contrast  Result Date:  01/29/2018 CLINICAL DATA:  Acute onset of nausea, vomiting and diarrhea. EXAM: CT ABDOMEN AND PELVIS WITH CONTRAST TECHNIQUE: Multidetector CT imaging of the abdomen and pelvis was performed using the standard protocol following bolus administration of intravenous contrast. CONTRAST:  36mL OMNIPAQUE IOHEXOL 300 MG/ML  SOLN COMPARISON:  CT of the abdomen and pelvis performed 09/24/2017, and MRCP performed 09/27/2017. Right upper quadrant ultrasound performed 09/26/2017 FINDINGS: Lower chest: Scattered coronary artery calcifications are seen. The visualized lung bases are clear. Hepatobiliary: Scattered cystic lesions noted within the liver, measuring up to 2.4 cm in size. The liver is otherwise unremarkable. The gallbladder is unremarkable in appearance. The common bile duct remains normal in caliber. Pancreas: The pancreas is within normal limits. Spleen: The spleen is unremarkable in appearance. Adrenals/Urinary Tract: The adrenal glands are unremarkable in appearance. The kidneys are within normal limits. There is no evidence of hydronephrosis. No renal or ureteral stones are identified. No perinephric stranding is seen. Stomach/Bowel: The cecum is filled with stool. The appendix is not definitely seen; there is no evidence for appendicitis. Pneumatosis is suggested along the sigmoid colon. The small bowel is grossly unremarkable. The stomach is within normal limits. Vascular/Lymphatic: Scattered calcification is seen along the abdominal aorta and its branches. The abdominal aorta is otherwise grossly unremarkable. The inferior vena cava is grossly unremarkable. No retroperitoneal lymphadenopathy is seen. No pelvic sidewall lymphadenopathy is identified. Reproductive: The bladder is mildly distended and grossly unremarkable. The patient is status post hysterectomy. No suspicious adnexal masses are seen. Other: No additional soft tissue abnormalities are seen. Musculoskeletal: No acute osseous abnormalities are  identified. Bilateral hip arthroplasties are grossly unremarkable in appearance, though incompletely imaged. The visualized musculature is unremarkable in appearance. IMPRESSION: 1. Cecum filled with stool, with additional stool noted along the colon. This may reflect mild constipation. 2. Pneumatosis suggested along the sigmoid colon, of uncertain significance. 3. Scattered cystic lesions within the liver, measuring up to 2.4 cm, are likely benign. 4. Scattered coronary artery calcifications. Aortic Atherosclerosis (ICD10-I70.0). Electronically Signed   By: Garald Balding M.D.   On: 01/29/2018 01:59     Medications:   . sodium chloride (hypertonic)     . amLODipine  2.5 mg Oral Daily  . atorvastatin  10 mg Oral Daily  . clopidogrel  75 mg Oral Daily  . donepezil  10 mg Oral QHS  . enoxaparin (LOVENOX) injection  40 mg Subcutaneous Q24H  . fentaNYL  25 mcg Transdermal Q72H  . mometasone-formoterol  2 puff Inhalation BID  . multivitamin-lutein  1 capsule Oral Daily  . oxyCODONE-acetaminophen  1 tablet Oral TID   And  . oxyCODONE  5 mg Oral TID  . pantoprazole  40 mg Oral Daily  . phosphorus  500 mg Oral Q4H  . senna-docusate  2 tablet Oral Daily   acetaminophen **OR** acetaminophen, bisacodyl, lubiprostone, ondansetron **OR** ondansetron (ZOFRAN) IV, polyethylene glycol, risperiDONE  Assessment/ Plan:  72 y.o. female with a PMHx of anxiety, chronic pain syndrome on opiates, chronic cervical spondylosis, depression, dementia, diverticulosis, GERD, hyperlipidemia, hypertension, prior history of CVA, who was admitted to [facility] on 01/29/2018 for evaluation of nausea, vomiting, poor p.o. Intake.   1.  Hyponatremia secondary to nausea, vomiting, poor p.o. intake. 2.  Hypokalemia. 3.  Constipation. 4.  Hypomagnesemia.  Plan:  Despite additional IV fluid hydration her serum sodium is only up to 114 today.  This suggests some element of SIADH.  Therefore we have discontinued D5 normal saline  and opted for 3% saline to be given at 35 cc/h.  We will monitor serum sodium closely.  Serum potassium has corrected to 4.1.  There was also manual disimpaction performed.  We will obtain CT scan of the chest to make sure there is no underlying lung mass or lesion that could be accounting for SIADH as well.  Further plan as patient progresses.   LOS: 1 Treshawn Allen 4/17/201911:59 AM

## 2018-01-30 NOTE — Progress Notes (Signed)
Family Meeting Note  Advance Directive:yes  Today a meeting took place with the Patient and spouse.  Patient is unable to participate due PN:[id number] capacity [id number]   The following clinical team members were present during this meeting:MD  The following were discussed:Patient's diagnosis:    72 year old female with advanced dementia, severe bowel obstruction, hypertension, history of hyponatremia, stroke, chronic neck pain and back pain presents to hospital secondary to nausea vomiting and constipation.  Noted to have hyponatremia.  1.  Acute on chronic hyponatremia  2.  Hypokalemia  3.  Constipation  4.  hypertension  5.  Chronic pain  Patient's progosis: < 6 months and Goals for treatment: DNR  Additional follow-up to be provided: Palliative care eval to decide if she would qualify for Hospice at Home.  Time spent during discussion:20 minutes  Max Sane, MD

## 2018-01-31 ENCOUNTER — Telehealth: Payer: Self-pay | Admitting: Family Medicine

## 2018-01-31 DIAGNOSIS — R531 Weakness: Secondary | ICD-10-CM

## 2018-01-31 DIAGNOSIS — Z515 Encounter for palliative care: Secondary | ICD-10-CM

## 2018-01-31 DIAGNOSIS — E871 Hypo-osmolality and hyponatremia: Secondary | ICD-10-CM

## 2018-01-31 DIAGNOSIS — E43 Unspecified severe protein-calorie malnutrition: Secondary | ICD-10-CM

## 2018-01-31 LAB — BASIC METABOLIC PANEL
ANION GAP: 4 — AB (ref 5–15)
Anion gap: 3 — ABNORMAL LOW (ref 5–15)
BUN: 13 mg/dL (ref 6–20)
BUN: 20 mg/dL (ref 6–20)
CALCIUM: 8.2 mg/dL — AB (ref 8.9–10.3)
CHLORIDE: 98 mmol/L — AB (ref 101–111)
CO2: 25 mmol/L (ref 22–32)
CO2: 27 mmol/L (ref 22–32)
Calcium: 8.5 mg/dL — ABNORMAL LOW (ref 8.9–10.3)
Chloride: 94 mmol/L — ABNORMAL LOW (ref 101–111)
Creatinine, Ser: 0.7 mg/dL (ref 0.44–1.00)
Creatinine, Ser: 0.64 mg/dL (ref 0.44–1.00)
GFR calc Af Amer: >60 mL/min (ref >60)
GFR calc Af Amer: >60 mL/min (ref >60)
GFR calc non Af Amer: >60 mL/min (ref >60)
GLUCOSE: 103 mg/dL — AB (ref 65–99)
Glucose, Bld: 108 mg/dL — ABNORMAL HIGH (ref 65–99)
POTASSIUM: 4.2 mmol/L (ref 3.5–5.1)
POTASSIUM: 4.6 mmol/L (ref 3.5–5.1)
SODIUM: 123 mmol/L — AB (ref 135–145)
SODIUM: 128 mmol/L — AB (ref 135–145)

## 2018-01-31 LAB — MAGNESIUM: MAGNESIUM: 2 mg/dL (ref 1.7–2.4)

## 2018-01-31 LAB — CBC
HEMATOCRIT: 22.2 % — AB (ref 35.0–47.0)
HEMOGLOBIN: 7.3 g/dL — AB (ref 12.0–16.0)
MCH: 23.5 pg — AB (ref 26.0–34.0)
MCHC: 32.8 g/dL (ref 32.0–36.0)
MCV: 71.5 fL — AB (ref 80.0–100.0)
Platelets: 731 10*3/uL — ABNORMAL HIGH (ref 150–440)
RBC: 3.11 MIL/uL — AB (ref 3.80–5.20)
RDW: 16.5 % — ABNORMAL HIGH (ref 11.5–14.5)
WBC: 10.8 10*3/uL (ref 3.6–11.0)

## 2018-01-31 LAB — PHOSPHORUS: Phosphorus: 2.9 mg/dL (ref 2.5–4.6)

## 2018-01-31 LAB — SODIUM: SODIUM: 129 mmol/L — AB (ref 135–145)

## 2018-01-31 MED ORDER — DIPHENHYDRAMINE HCL 50 MG/ML IJ SOLN
12.5000 mg | Freq: Once | INTRAMUSCULAR | Status: AC
  Administered 2018-02-01: 12.5 mg via INTRAVENOUS
  Filled 2018-01-31: qty 1

## 2018-01-31 MED ORDER — SODIUM CHLORIDE 3 % IV SOLN
INTRAVENOUS | Status: DC
  Administered 2018-01-31: 25 mL/h via INTRAVENOUS
  Filled 2018-01-31: qty 500

## 2018-01-31 MED ORDER — PROCHLORPERAZINE EDISYLATE 10 MG/2ML IJ SOLN
10.0000 mg | INTRAMUSCULAR | Status: DC | PRN
  Filled 2018-01-31: qty 2

## 2018-01-31 MED ORDER — HALOPERIDOL LACTATE 5 MG/ML IJ SOLN
2.0000 mg | Freq: Once | INTRAMUSCULAR | Status: AC
  Administered 2018-02-01: 2 mg via INTRAVENOUS
  Filled 2018-01-31: qty 1

## 2018-01-31 MED ORDER — HALOPERIDOL LACTATE 5 MG/ML IJ SOLN
2.0000 mg | Freq: Once | INTRAMUSCULAR | Status: AC
  Administered 2018-01-31: 2 mg via INTRAVENOUS
  Filled 2018-01-31: qty 1

## 2018-01-31 MED ORDER — DIPHENHYDRAMINE HCL 50 MG/ML IJ SOLN
12.5000 mg | Freq: Once | INTRAMUSCULAR | Status: AC
  Administered 2018-01-31: 12.5 mg via INTRAVENOUS
  Filled 2018-01-31: qty 1

## 2018-01-31 MED ORDER — OXYCODONE HCL 5 MG PO TABS
5.0000 mg | ORAL_TABLET | Freq: Four times a day (QID) | ORAL | Status: DC | PRN
  Administered 2018-02-01: 5 mg via ORAL
  Filled 2018-01-31: qty 1

## 2018-01-31 NOTE — Care Management (Signed)
Dr. Margaretmary Eddy updated on hospice POC. Family is anticipating discharge tomorrow.

## 2018-01-31 NOTE — Progress Notes (Addendum)
Daily Progress Note   Patient Name: Aimee Jones       Date: 01/31/2018 DOB: [birth year]  Age: 72 y.o. MRN#: [id number] Attending Physician: Aimee Mango, MD Primary Care Physician: Aimee Ghent, MD Admit Date: 01/29/2018  Reason for Consultation/Follow-up: Establishing goals of care, Non pain symptom management and Pain control  Subjective: Patient awake and alert resting in bed. She is alert to self and family only.  Husband, Gwyndolyn Saxon and daughter Seth Bake currently at the bedside.  Patient complains of shoulder and back pain.  Nurse recently administered pain medication due to pain.  Daughter states during the night with patient, and stated it was a very restless night as patient was quite agitated.  One-time as needed dose of Haldol and Benadryl was given and daughter stated patient slept for about 3-1/2 hours after administration.  Discussed with daughter her awareness of plan for her mother to return home and transition from palliative services to hospice services.  We discussed in detail the difference between palliative and hospice services in the home.  Daughter verbalized understanding that the goal of care is to provide comfort and symptom management in the home without use of an aggressive measures such as IV fluids lab testing etc.  Daughter concerned if patient would be able to continue her current home activities.  Reassured daughter that patient will continue to be offered nutrition at her discretion, would be able to get up out of bed with assistance and engage in family interactions whether this be outside or inside of the home.  This would continue as long as it was safe for patient and she was able to participate with knowing that she may eventually become too weak or disengage to  maintain these activities.  Daughter verbalized understanding and appreciation of conversation.  Husband verbalized speaking to [name] with hospice and [facility] of [facility] today and she is planning to make a visit to them today. He thinks at this time they have appropriate home equipment but will see if [name] with [facility] has any further suggestions.   Chart Reviewed.   Length of Stay: 2  Current Medications: Scheduled Meds:  . amLODipine  2.5 mg Oral Daily  . atorvastatin  10 mg Oral Daily  . clopidogrel  75 mg Oral Daily  . donepezil  10 mg Oral  QHS  . enoxaparin (LOVENOX) injection  40 mg Subcutaneous Q24H  . feeding supplement (ENSURE ENLIVE)  237 mL Oral BID BM  . fentaNYL  25 mcg Transdermal Q72H  . mometasone-formoterol  2 puff Inhalation BID  . multivitamin with minerals  1 tablet Oral Daily  . oxyCODONE-acetaminophen  1 tablet Oral TID   And  . oxyCODONE  5 mg Oral TID  . pantoprazole  40 mg Oral Daily  . senna-docusate  2 tablet Oral Daily    Continuous Infusions: . sodium chloride (hypertonic) 25 mL/hr (01/31/18 0841)    PRN Meds: acetaminophen **OR** acetaminophen, bisacodyl, lubiprostone, morphine injection, ondansetron **OR** ondansetron (ZOFRAN) IV, polyethylene glycol, prochlorperazine, risperiDONE  Physical Exam          Constitutional: Vital signs are normal. She is cooperative.  Cardiovascular: Normal rate, regular rhythm, normal heart sounds, intact distal pulses and normal pulses.  Pulmonary/Chest: Effort normal. She has decreased breath sounds.  Abdominal: Soft. Normal appearance and bowel sounds are normal.  Musculoskeletal:  Generalized weakness   Neurological: She is alert. She is disoriented. Alert to self and family only.  Hx of advanced dementia   Psychiatric: Her speech is normal. Cognition and memory are impaired. She expresses inappropriate judgment.  Nursing note and vitals reviewed  Vital Signs: BP 125/71 (BP Location: Right Arm)    Pulse 87   Temp (!) 97.4 F (36.3 C) (Oral)   Resp 16   Ht 5\' 7"  (1.702 m)   Wt 58.1 kg (128 lb)   SpO2 97%   BMI 20.05 kg/m  SpO2: SpO2: 97 % O2 Device: O2 Device: Room Air O2 Flow Rate:    Intake/output summary:   Intake/Output Summary (Last 24 hours) at 01/31/2018 1140 Last data filed at 01/30/2018 2108 Gross per 24 hour  Intake 661.83 ml  Output -  Net 661.83 ml   LBM: Last BM Date: 01/30/18 Baseline Weight: Weight: 58.1 kg (128 lb) Most recent weight: Weight: 58.1 kg (128 lb)       Palliative Assessment/Data: PPS 40%    Patient Active Problem List   Diagnosis Date Noted  . Protein-calorie malnutrition, severe 01/30/2018  . Chronic neck pain 12/16/2017  . Skin irritation 12/16/2017  . Pressure injury of skin 09/25/2017  . Abdominal pain 09/24/2017  . Healthcare maintenance 08/07/2017  . Headache 11/14/2016  . Hearing loss 05/24/2016  . Enteritis due to Clostridium difficile 05/24/2016  . Leukocytosis 05/12/2016  . Thrombocytosis ([place]) 05/12/2016  . Cervical stenosis of spine 01/10/2016  . Weight loss 12/06/2015  . Dysuria 10/14/2015  . Cerebrovascular accident (CVA) due to bilateral embolism of posterior cerebral arteries ([place]) 09/27/2015  . Chronic pain syndrome 09/27/2015  . Elevated blood pressure 07/07/2015  . Seizure-like activity ([place]) 07/04/2015  . Seizure ([place]) 07/04/2015  . Depression   . GI bleed 06/24/2015  . Frequent falls   . Narcotic abuse ([place])   . Benzodiazepine abuse ([place])   . 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 Generalized anxiety disorder   . Hypokalemia   . Hypomagnesemia   . Anemia, iron deficiency   . Urinary retention   . Altered mental status 06/22/2015  . Fall 06/22/2015  . Iron deficiency anemia 06/03/2015  . Medicare annual wellness visit, subsequent 04/23/2015  . Advance care planning 04/23/2015  . Senile purpura ([place]) 04/23/2015  . Abnormal CBC 04/23/2015  . Sleep apnea 12/11/2014  .  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 Stroke ([place])   .  Cerebral thrombosis with cerebral infarction ([place]) 10/13/2014  . HLD (hyperlipidemia)   . Facial droop 10/12/2014  . CKD (chronic kidney disease), stage III ([place]) 10/12/2014  . S/P shoulder replacement 08/13/2014  . Postmenopausal HRT (hormone replacement therapy) 05/31/2014  . Hyponatremia 05/28/2014  . Shoulder pain, bilateral 10/25/2012  . Parkinsonism ([place]) 08/22/2012  . MEMORY LOSS 06/22/2010  . ALCOHOL ABUSE 03/08/2010  . 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 OBSTRUCTIVE CHRONIC BRONCHITIS WITH EXACERBATION 01/02/2008  . GLUCOSE INTOLERANCE 11/15/2007  . GERD 07/25/2007  . OSTEOARTHRITIS 07/25/2007  . PANIC ATTACK 05/01/2007  . Essential hypertension 05/01/2007  . ALLERGIC RHINITIS 05/01/2007  . ASTHMA 05/01/2007  . DIVERTICULOSIS, COLON 05/01/2007  . MENOPAUSAL DISORDER 05/01/2007  . FIBROMYALGIA 05/01/2007    Palliative Care Assessment & Plan   Patient Profile: 72 y.o. female  admitted on 01/29/2018 from home with nausea and vomiting x2 days per family. Patient has a past medical history significant for hypertension, IDA, essential thrombocytosis, asthma, DJD/OA, anxiety, depression, late stage dementia, GERD, and hyperlipidemia. Pt is disoriented and has severe memory impairment, and is not able to provide any pertinent Hx. Hx obtained from pt's husband (DPOA) at bedside. Per spouse patient typically has complaints of chronic B/L shoulder pain (2/2 cervical spondylosis/radiculopathy), and a chronic headache (characterized as pain behind the eyes). He reports prior to admission she had thrown up multiple times per day, though he is unable to quantify. Vomitus was characterized as bilious and brown, no hematemesis noted. The pt's husband states she had not eaten food for about 2 days prior, but was drinking plenty of water. She has been constipated. There have been no reports of fever, chills, diarrhea, AP,  CP, SOB, palpitations, diaphoresis, rigors, night sweats, blurred vision, focal neurological deficits, LH/LOC, urinary symptoms. Pt is DNR/DNI per husband. Since admission she was found to have acute on chronic hyponatremia and constipation. She has received fluid replacements and laxatives for correction. Patient is currently receiving Palliative services at home with Hosp[facility] and [place]. Palliative Medicine consulted for goals of care discussion.   Recommendations/Plan:  DNR/DNI (DNR form completed)   At family's request continue to treat the treatable while patient is hospitalized.  With goal of returning home and transitioning from palliative services to hospice services.  Case management for outpatient hospice services. [name] with [facility] has been in contact with family and planning a visit today in hospital or at the home if discharged per husband.   Agree with attending orders for fentanyl patch and scheduled oxycodone regimen for pain.  Agree with Zofran and/or Compazine as needed for nausea and vomiting.  Agree with Amitiza, Senokot, and Miralax for constipation.  Discussed with her husband the importance of her taking a supplement every day to prevent constipation given her use of opioids.  Agree with Ensure supplements as ordered by dietitian for nutritional support.   Would consider increasing Risperdal from 0.5 mg to 1mg  nightly for insomnia/agitation.  Palliative medicine will continue to support patient, family, and medical team during hospitalization.  Goals of Care and Additional Recommendations:  Limitations on Scope of Treatment: Full Scope Treatment continue to treat the treatable during hospitalizations   Code Status:    Code Status Orders  (From admission, onward)        Start     Ordered   01/29/18 0426  Do not attempt resuscitation (DNR)  Continuous    Question Answer Comment  In the event of  cardiac or  respiratory ARREST Do not call a "code blue"   In the event of cardiac or respiratory ARREST Do not perform Intubation, CPR, defibrillation or ACLS   In the event of cardiac or respiratory ARREST Use medication by any route, position, wound care, and other measures to relive pain and suffering. May use oxygen, suction and manual treatment of airway obstruction as needed for comfort.      01/29/18 0427    Code Status History    Date Active Date Inactive Code Status Order ID Comments User Context   01/29/2018 0331 01/29/2018 0427 Full Code [id number]  Arta Silence, MD ED   09/24/2017 1311 09/27/2017 2252 Full Code [id number]  Epifanio Lesches, MD ED   08/01/2016 1345 08/01/2016 1848 Full Code [id number]  Milly Jakob, MD Inpatient   07/04/2015 2358 07/07/2015 1930 Full Code [id number]  Theressa Millard, MD ED   06/22/2015 2021 06/29/2015 1822 Full Code [id number]  Truett Mainland, DO Inpatient   10/14/2014 1124 10/14/2014 1507 Full Code [id number]  Evans Lance, MD Inpatient   10/12/2014 1842 10/14/2014 1124 Full Code [id number]  Verlee Monte, MD ED   08/13/2014 1228 08/14/2014 1506 Full Code [id number]  Jenetta Loges, PA-C Inpatient    Advance Directive Documentation     Most Recent Value  Type of Advance Directive  Healthcare Power of Attorney, Living will, Out of facility DNR (pink MOST or yellow form)  Pre-existing out of facility DNR order (yellow form or pink MOST form)  Physician notified to receive inpatient order  "MOST" Form in Place?  -       Prognosis:   < 6 months in the setting of advanced dementia, poor p.o. Intake, decreased activity, increased weakness, and chronic pain.  Discharge Planning:  Home with Hospice per family's request.   Care plan was discussed with patient's family, bedside RN, and Dr. Margaretmary Eddy.   Thank you for allowing the Palliative Medicine Team to assist in the care of this patient.   Total Time 35 min.  Prolonged Time Billed No        Greater than 50%  of this time was spent counseling and coordinating care related to the above assessment and plan.  Alda Lea, NP  Please contact Palliative Medicine Team phone at [phone] for questions and concerns.

## 2018-01-31 NOTE — Telephone Encounter (Signed)
Response given to [name] at [facility] .

## 2018-01-31 NOTE — Plan of Care (Signed)
  Problem: Clinical Measurements: Goal: Will remain free from infection Outcome: Progressing Note:  Remains afebrile   Problem: Safety: Goal: Ability to remain free from injury will improve Outcome: Progressing Note:  Fall precautions in place, non skid socks when oob   Problem: Clinical Measurements: Goal: Diagnostic test results will improve Outcome: Not Progressing Note:  Na level 123 this am, started pt on NS 3% solution   Problem: Pain Managment: Goal: General experience of comfort will improve Outcome: Not Progressing Note:  Pt has chronic pain issues, scheduled and prn medications

## 2018-01-31 NOTE — Progress Notes (Signed)
Pt's daughter and son asking for something to help patient rest.  Per daughter, pt received benadryl 12.5mg  IV push and haldol 2mg  IV push last night and stated that it helped her a lot with resting.  This RN paged Dr. Jannifer Franklin and explained situation to him.  MD to reorder this medication for patient.

## 2018-01-31 NOTE — Consult Note (Signed)
PHARMACY RELATED CONSULT NOTE - INITIAL   Pharmacy Consult for electrolyte monitoring and replacement   Labs: Recent Labs    01/28/18 2340 01/29/18 0518  01/30/18 0319 01/30/18 1654 01/31/18 0309  WBC 15.7* 13.2*  --   --   --  10.8  HGB 10.5* 10.3*  --   --   --  7.3*  HCT 28.8* 31.1*  --   --   --  22.2*  PLT 794* 873*  --   --   --  731*  CREATININE 0.57 0.48   < > 0.57 0.57 0.70  MG  --  1.4*  --  1.7  --  2.0  PHOS  --  1.9*  --  2.5  --  2.9  ALBUMIN 3.6  --   --   --   --   --   PROT 6.4*  --   --   --   --   --   AST 25  --   --   --   --   --   ALT 14  --   --   --   --   --   ALKPHOS 61  --   --   --   --   --   BILITOT 0.4  --   --   --   --   --    < > = values in this interval not displayed.   Potassium (mmol/L)  Date Value  01/31/2018 4.2   Magnesium (mg/dL)  Date Value  01/31/2018 2.0   Phosphorus (mg/dL)  Date Value  01/31/2018 2.9  ] Estimated Creatinine Clearance: 58.3 mL/min (by C-G formula based on SCr of 0.7 mg/dL).  Assessment: Pharmacy consulted for electrolyte monitoring in 72 yo female admitted with hyponatremia secondary to N/V and poor PO intake. On admission patient had hypokalemia and hypomagnesemia.   Goal of Therapy:  K: 3.5-5 Mg: 1.8-2.4 Phos: 2.5-4.6  Plan:  Hypertonic 3% saline discontinued due to NA increase from 114 to 123. Nephrology is following patient.   Will not replace calcium at this time due to reported constipation. All other electrolytes are WNL at this time.   Recheck electrolytes with AM labs.   Pernell Dupre, PharmD, BCPS Clinical Pharmacist 01/31/2018 7:27 AM

## 2018-01-31 NOTE — Progress Notes (Signed)
[facility] NAME: Aimee Jones    MR#:  [id number]  DATE OF BIRTH:  [birth year]  SUBJECTIVE:  CHIEF COMPLAINT:   Chief Complaint  Patient presents with  . Emesis  Patient is awake and alert feeling weak and tired wants to go home, daughter and husband at bedside  REVIEW OF SYSTEMS:  Review of Systems  Constitutional: Negative for chills, fever and malaise/fatigue.  HENT: Negative for congestion, ear discharge, hearing loss and nosebleeds.   Eyes: Negative for blurred vision and double vision.  Respiratory: Negative for cough, shortness of breath and wheezing.   Cardiovascular: Negative for chest pain and palpitations.  Gastrointestinal: Positive for nausea and vomiting. Negative for abdominal pain, constipation and diarrhea.  Genitourinary: Negative for dysuria.  Musculoskeletal: Positive for back pain, myalgias and neck pain.  Neurological: Negative for dizziness, focal weakness, seizures, weakness and headaches.  Psychiatric/Behavioral: Negative for depression.   DRUG ALLERGIES:   Allergies  Allergen Reactions  . Tramadol Other (See Comments)    Would avoid.  History of seizure on medication  . Latex Rash  . Penicillins Rash    .Marland KitchenHas patient had a PCN reaction causing immediate rash, facial/tongue/throat swelling, SOB or lightheadedness with hypotension: No Has patient had a PCN reaction causing severe rash involving mucus membranes or skin necrosis: No Has patient had a PCN reaction that required hospitalization No Has patient had a PCN reaction occurring within the last 10 years: No If all of the above answers are "NO", then may proceed with Cephalosporin use.      VITALS:  Blood pressure (!) 144/64, pulse 95, temperature 98.6 F (37 C), resp. rate 16, height 5\' 7"  (1.702 m), weight 58.1 kg (128 lb), SpO2 94 %.  PHYSICAL EXAMINATION:  Physical Exam  GENERAL:  72 y.o.-year-old elderly patient lying in the  bed with no acute distress.  EYES: Pupils equal, round, reactive to light and accommodation. No scleral icterus. Extraocular muscles intact.  HEENT: Head atraumatic, normocephalic. Oropharynx and nasopharynx clear.  NECK:  Supple, no jugular venous distention. No thyroid enlargement, no tenderness.  LUNGS: Normal breath sounds bilaterally, no wheezing, rales,rhonchi or crepitation. No use of accessory muscles of respiration.  CARDIOVASCULAR: S1, S2 normal. No  rubs, or gallops. 2/6 systolic murmur present ABDOMEN: Soft, nontender, nondistended. Bowel sounds present. No organomegaly or mass.  EXTREMITIES: No pedal edema, cyanosis, or clubbing.  NEUROLOGIC: Cranial nerves II through XII are intact. Muscle strength 5/5 in all extremities. Sensation intact. Gait not checked.  PSYCHIATRIC: The patient is sleepy SKIN: No obvious rash, lesion, or ulcer.  LABORATORY PANEL:   CBC Recent Labs  Lab 01/31/18 0309  WBC 10.8  HGB 7.3*  HCT 22.2*  PLT 731*   ------------------------------------------------------------------------------------------------------------------  Chemistries  Recent Labs  Lab 01/28/18 2340  01/31/18 0309 01/31/18 1517 01/31/18 1902  NA 109*   < > 123* 128* 129*  K 2.9*   < > 4.2 4.6  --   CL 74*   < > 94* 98*  --   CO2 23   < > 25 27  --   GLUCOSE 111*   < > 103* 108*  --   BUN 12   < > 13 20  --   CREATININE 0.57   < > 0.70 0.64  --   CALCIUM 8.7*   < > 8.2* 8.5*  --   MG  --    < > 2.0  --   --  AST 25  --   --   --   --   ALT 14  --   --   --   --   ALKPHOS 61  --   --   --   --   BILITOT 0.4  --   --   --   --    < > = values in this interval not displayed.   ------------------------------------------------------------------------------------------------------------------  Cardiac Enzymes No results for input(s): TROPONINI in the last 168  hours. ------------------------------------------------------------------------------------------------------------------  RADIOLOGY:  Ct Chest Wo Contrast  Result Date: 01/30/2018 CLINICAL DATA:  Hyponatremia.  Possible lung mass. EXAM: CT CHEST WITHOUT CONTRAST TECHNIQUE: Multidetector CT imaging of the chest was performed following the standard protocol without IV contrast. COMPARISON:  Radiograph of September 24, 2017. CT scan of July 27, 2005. FINDINGS: Cardiovascular: Atherosclerosis of thoracic aorta is noted without aneurysm formation. Coronary artery calcifications are noted. Normal cardiac size. Minimal pericardial effusion is noted. Mediastinum/Nodes: No enlarged mediastinal or axillary lymph nodes. Thyroid gland, trachea, and esophagus demonstrate no significant findings. Lungs/Pleura: No pneumothorax or pleural effusion is noted. Minimal bibasilar subsegmental atelectasis is noted. 4 mm nodule is noted in right lower lobe best seen on image number 87 of series 3. Upper Abdomen: No acute abnormality. Musculoskeletal: No chest wall mass or suspicious bone lesions identified. IMPRESSION: 4 mm nodule noted in right lower lobe. No follow-up needed if patient is low-risk. Non-contrast chest CT can be considered in 12 months if patient is high-risk. This recommendation follows the consensus statement: Guidelines for Management of Incidental Pulmonary Nodules Detected on CT Images: From the Fleischner Society 2017; Radiology 2017; 284:228-243. Coronary artery calcifications are noted suggesting coronary artery disease. Minimal pericardial effusion. Aortic Atherosclerosis (ICD10-I70.0). Electronically Signed   By: Marijo Conception, M.D.   On: 01/30/2018 13:07    EKG:   Orders placed or performed during the hospital encounter of 09/24/17  . EKG 12-Lead  . EKG 12-Lead  . EKG    ASSESSMENT AND PLAN:   72 year old female with advanced dementia, severe bowel obstruction, hypertension, history of  hyponatremia, stroke, chronic neck pain and back pain presents to hospital secondary to nausea vomiting and constipation.  Noted to have hyponatremia.  1.  Acute on chronic hyponatremia-could be a combination of things.  Poor oral intake, increased free water intake.  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 -Appreciate nephrology consult.   patient is receiving 3% hypertonic saline at 25 cc/h  -Goal sodium increases 8-12 mEq per 24 hours. Na 123-128 --129 today  2.  Hypokalemia-secondary to ileus, constipation and GI issues. -Being replaced  3.  Constipation-laxatives added.  Monitor closely -CT with no bowel obstruction noted -Questionable pneumatosis on the CT abdomen evaluated by surgeon on admission and does not think this is perforation  4.  hypertension-continue Norvasc  5.  Chronic pain-continue pain medications  6.  DVT prophylaxis-on Lovenox   Patient is followed by [facility] for Palliative care services. Husband is very much interested in keeping her comfortable as she is clinically worsening.  Patient was qualified for hospice care at home, plan is to discharge her home with hospice care tomorrow a.m.  All the records are reviewed and case discussed with Care Management/Social Workerr. Management plans discussed with the patient, family and they are in agreement.  CODE STATUS: DNR  TOTAL TIME TAKING CARE OF THIS PATIENT: 32 minutes.   POSSIBLE D/C IN 1-2 DAYS, DEPENDING ON CLINICAL CONDITION.   Illene Silver  Kervens Roper M.D on 01/31/2018 at 8:55 PM  Between 7am to 6pm - Pager - [phone]  After 6pm go to [email or web address] - password [email or web address] [facility]  Office  [phone]  CC: Primary care physician; Tonia Ghent, MD

## 2018-01-31 NOTE — Progress Notes (Signed)
PT Cancellation Note  Patient Details Name: Aimee Jones MRN: [id number] DOB: Mar 06, 1946   Cancelled Treatment:    Reason Eval/Treat Not Completed: Other (comment). Per medical record, plan is for patient to return home with hospice services. PT signing off. Please place new order if PT services are needed in the future.   12:29 PM, 01/31/18 [name], PT, DPT Physical Therapist - [facility]  [phone] (Index)    Buccola,Allan C 01/31/2018, 12:25 PM

## 2018-01-31 NOTE — Care Management (Signed)
TC to [facility] to have them evaluate patient for appropriateness of transition to hospice services.

## 2018-01-31 NOTE — Progress Notes (Addendum)
[facility] Liaison:  RN visit  Notified by Lattie Haw [facility], at [phone], of patient/family request for Hospice and [place] services at home after discharge.  Chart and patient information were reviewed with a [place].  Hospice eligibility approved by Dr. Loletha Grayer. [place].   Writer spoke with Gwyndolyn Saxon, husband, over the phone and will see him at bedside to initiate education related to hospice philosophy, services and team approach to care.  Patient/family verbalized understanding of information given.  Per discussion, plan is for discharge to home by [facility] on 02/01/18.  Please send signed and completed DNR form home with patient/family.  Patient will need prescriptions for discharge comfort medications.  DME needs have been discussed, patient currently has the following equipment in the home:  Walker and bedside commode.  Patient/family requests the following DME for delivery to the home:  none.    [facility] is aware of the above.  Completed discharge summary will need to be faxed to [facility] at [phone], when final.  Please notify [facility] when patient is ready to leave the unit at discharge.  (Call [phone] or [phone] if its after 5pm.)  [facility] information and contact numbers will be given to [place], husband, during visit.  Above information shared with Lattie Haw, [facility].  Please call with any hospice related questions.  Thank you for the referral.  Edyth Gunnels, RN, [facility] Liaison [phone]  All hospital liaisons are now on [place].

## 2018-01-31 NOTE — Telephone Encounter (Signed)
Copied from [place] [phone]. Topic: General - Other >> Jan 31, 2018 12:07 PM Margot Ables wrote: Reason for CRM: Pt is discharging from [facility] 4/18 or 4/19. Pt is requesting for Dr. [name] to be attending for Hospice. Please call to advise.

## 2018-01-31 NOTE — Progress Notes (Signed)
Spoke with d/ [place] regarding hypertonic saline rate and last Na lab. Na this AM was 123- up to 128 @ 1500 (drip had been running ~6 hours). Goal is for pt to be at 130 tomorrow AM. Do not want to increase Na more than 8 points in 24hr. Per Dr. Holley Raring decrease rate to 26ml/hr. Next Na check is due @ 1900 and q 4 hours. Per Dr. Holley Raring if next level is 130 turn drip off. Pharmacy will continue to monitor. RN informed of plan  Ramond Dial, Pharm.D, BCPS Clinical Pharmacist

## 2018-01-31 NOTE — Telephone Encounter (Signed)
Assuming patient/husband agree to hospice, then I will be the attending and I want hospice MD to be able to co-manage/give orders.   Thanks.

## 2018-01-31 NOTE — Progress Notes (Signed)
Pt becoming extremely agitated. Per daughter Seth Bake, this is "more than usual" and occurs if pt is in the hospital. Daughter requested for something to calm pt down. Dr. Marcille Blanco paged, ordered for one time dose of Benadryl 12.5mg  and Haldol 2mg  IV. Will administer and continue to monitor.

## 2018-01-31 NOTE — Progress Notes (Signed)
[facility]  ROUNDING NOTE   Subjective:  Serum sodium up to 124 today. Patient appears to be in better spirits.   Objective:  Vital signs in last 24 hours:  Temp:  [97.4 F (36.3 C)-98.4 F (36.9 C)] 97.4 F (36.3 C) (04/18 0822) Pulse Rate:  [87] 87 (04/18 0822) Resp:  [16-18] 16 (04/18 0822) BP: (118-125)/(67-71) 125/71 (04/18 0822) SpO2:  [95 %-97 %] 97 % (04/18 0822)  Weight change:  Filed Weights   01/28/18 2335  Weight: 58.1 kg (128 lb)    Intake/Output: I/O last 3 completed shifts: In: 661.8 [P.O.:600; I.V.:61.8] Out: -    Intake/Output this shift:  No intake/output data recorded.  Physical Exam: General: No acute distress  Head: Normocephalic, atraumatic. Moist oral mucosal membranes  Eyes: Anicteric  Neck: Supple, trachea midline  Lungs:  Clear to auscultation, normal effort  Heart: S1S2 no rubs  Abdomen:  Soft, nontender, bowel sounds present  Extremities: No peripheral edema.  Neurologic: Awake, alert, following commands  Skin: No lesions       Basic Metabolic Panel: Recent Labs  Lab 01/29/18 0518 01/29/18 1550 01/30/18 0319 01/30/18 1147 01/30/18 1654 01/31/18 0309  NA 113* 112* 114* 121* 122*  122* 123*  K 2.6* 3.7 4.1  --  4.4 4.2  CL 81* 82* 87*  --  91* 94*  CO2 21* 24 23  --  24 25  GLUCOSE 94 110* 101*  --  97 103*  BUN 11 12 12   --  14 13  CREATININE 0.48 0.51 0.57  --  0.57 0.70  CALCIUM 8.3* 8.0* 8.2*  --  8.4* 8.2*  MG 1.4*  --  1.7  --   --  2.0  PHOS 1.9*  --  2.5  --   --  2.9    Liver Function Tests: Recent Labs  Lab 01/28/18 2340  AST 25  ALT 14  ALKPHOS 61  BILITOT 0.4  PROT 6.4*  ALBUMIN 3.6   Recent Labs  Lab 01/28/18 2340  LIPASE 19   No results for input(s): AMMONIA in the last 168 hours.  CBC: Recent Labs  Lab 01/28/18 2340 01/29/18 0518 01/31/18 0309  WBC 15.7* 13.2* 10.8  HGB 10.5* 10.3* 7.3*  HCT 28.8* 31.1* 22.2*  MCV 70.3* 70.6* 71.5*  PLT 794* 873* 731*    Cardiac  Enzymes: No results for input(s): CKTOTAL, CKMB, CKMBINDEX, TROPONINI in the last 168 hours.  BNP: Invalid input(s): POCBNP  CBG: No results for input(s): GLUCAP in the last 168 hours.  Microbiology: Results for orders placed or performed during the hospital encounter of 09/24/17  Blood Culture (routine x 2)     Status: None   Collection Time: 09/24/17  3:49 PM  Result Value Ref Range Status   Specimen Description   Final    BLOOD Blood Culture results may not be optimal due to an inadequate volume of blood received in culture bottles   Special Requests LEFT ANTECUBITAL  Final   Culture NO GROWTH 5 DAYS  Final   Report Status 09/29/2017 FINAL  Final  Blood Culture (routine x 2)     Status: None   Collection Time: 09/24/17  4:39 PM  Result Value Ref Range Status   Specimen Description BLOOD LEFT AC  Final   Special Requests   Final    BOTTLES DRAWN AEROBIC AND ANAEROBIC Blood Culture adequate volume   Culture NO GROWTH 5 DAYS  Final   Report Status 09/29/2017 FINAL  Final  C difficile quick scan w PCR reflex     Status: Abnormal   Collection Time: 09/25/17  8:44 PM  Result Value Ref Range Status   C Diff antigen POSITIVE (A) NEGATIVE Final   C Diff toxin NEGATIVE NEGATIVE Final   C Diff interpretation Results are indeterminate. See PCR results.  Final  Clostridium Difficile by PCR     Status: Abnormal   Collection Time: 09/25/17  8:44 PM  Result Value Ref Range Status   Toxigenic C. Difficile by PCR POSITIVE (A) NEGATIVE Final    Comment: Positive for toxigenic C. difficile with little to no toxin production. Only treat if clinical presentation suggests symptomatic illness.    Coagulation Studies: No results for input(s): LABPROT, INR in the last 72 hours.  Urinalysis: No results for input(s): COLORURINE, LABSPEC, PHURINE, GLUCOSEU, HGBUR, BILIRUBINUR, KETONESUR, PROTEINUR, UROBILINOGEN, NITRITE, LEUKOCYTESUR in the last 72 hours.  Invalid input(s): APPERANCEUR     Imaging: Ct Chest Wo Contrast  Result Date: 01/30/2018 CLINICAL DATA:  Hyponatremia.  Possible lung mass. EXAM: CT CHEST WITHOUT CONTRAST TECHNIQUE: Multidetector CT imaging of the chest was performed following the standard protocol without IV contrast. COMPARISON:  Radiograph of September 24, 2017. CT scan of July 27, 2005. FINDINGS: Cardiovascular: Atherosclerosis of thoracic aorta is noted without aneurysm formation. Coronary artery calcifications are noted. Normal cardiac size. Minimal pericardial effusion is noted. Mediastinum/Nodes: No enlarged mediastinal or axillary lymph nodes. Thyroid gland, trachea, and esophagus demonstrate no significant findings. Lungs/Pleura: No pneumothorax or pleural effusion is noted. Minimal bibasilar subsegmental atelectasis is noted. 4 mm nodule is noted in right lower lobe best seen on image number 87 of series 3. Upper Abdomen: No acute abnormality. Musculoskeletal: No chest wall mass or suspicious bone lesions identified. IMPRESSION: 4 mm nodule noted in right lower lobe. No follow-up needed if patient is low-risk. Non-contrast chest CT can be considered in 12 months if patient is high-risk. This recommendation follows the consensus statement: Guidelines for Management of Incidental Pulmonary Nodules Detected on CT Images: From the Fleischner Society 2017; Radiology 2017; 284:228-243. Coronary artery calcifications are noted suggesting coronary artery disease. Minimal pericardial effusion. Aortic Atherosclerosis (ICD10-I70.0). Electronically Signed   By: Marijo Conception, M.D.   On: 01/30/2018 13:07     Medications:   . sodium chloride (hypertonic) 25 mL/hr (01/31/18 0841)   . amLODipine  2.5 mg Oral Daily  . atorvastatin  10 mg Oral Daily  . clopidogrel  75 mg Oral Daily  . donepezil  10 mg Oral QHS  . enoxaparin (LOVENOX) injection  40 mg Subcutaneous Q24H  . feeding supplement (ENSURE ENLIVE)  237 mL Oral BID BM  . fentaNYL  25 mcg Transdermal Q72H   . mometasone-formoterol  2 puff Inhalation BID  . multivitamin with minerals  1 tablet Oral Daily  . oxyCODONE-acetaminophen  1 tablet Oral TID   And  . oxyCODONE  5 mg Oral TID  . pantoprazole  40 mg Oral Daily  . senna-docusate  2 tablet Oral Daily   acetaminophen **OR** acetaminophen, bisacodyl, lubiprostone, morphine injection, ondansetron **OR** ondansetron (ZOFRAN) IV, polyethylene glycol, prochlorperazine, risperiDONE  Assessment/ Plan:  72 y.o. female with a PMHx of anxiety, chronic pain syndrome on opiates, chronic cervical spondylosis, depression, dementia, diverticulosis, GERD, hyperlipidemia, hypertension, prior history of CVA, who was admitted to [facility] on 01/29/2018 for evaluation of nausea, vomiting, poor p.o. Intake.   1.  Hyponatremia secondary to nausea, vomiting, poor p.o. intake. 2.  Hypokalemia. 3.  Constipation.  4.  Hypomagnesemia.  Plan:  Serum sodium up to 123.  We will go ahead and reinitiate the patient on 3% saline at 25 cc per hour.  Continue to monitor serum sodium closely.  Goal sodium by tomorrow will be 130.    CT chest was performed and small 4 mm lung nodule was noted however patient does not have history of tobacco use.  Otherwise additional evaluation and management per hospitalist.   LOS: 2 Aimee Jones 4/18/201911:20 AM

## 2018-02-01 LAB — CBC
HCT: 20.3 % — ABNORMAL LOW (ref 35.0–47.0)
Hemoglobin: 6.7 g/dL — ABNORMAL LOW (ref 12.0–16.0)
MCH: 23.8 pg — AB (ref 26.0–34.0)
MCHC: 32.7 g/dL (ref 32.0–36.0)
MCV: 72.8 fL — ABNORMAL LOW (ref 80.0–100.0)
PLATELETS: 793 10*3/uL — AB (ref 150–440)
RBC: 2.8 MIL/uL — AB (ref 3.80–5.20)
RDW: 16.6 % — AB (ref 11.5–14.5)
WBC: 14.8 10*3/uL — ABNORMAL HIGH (ref 3.6–11.0)

## 2018-02-01 LAB — BASIC METABOLIC PANEL
Anion gap: 3 — ABNORMAL LOW (ref 5–15)
BUN: 15 mg/dL (ref 6–20)
CALCIUM: 8.7 mg/dL — AB (ref 8.9–10.3)
CO2: 27 mmol/L (ref 22–32)
CREATININE: 0.51 mg/dL (ref 0.44–1.00)
Chloride: 100 mmol/L — ABNORMAL LOW (ref 101–111)
GFR calc Af Amer: >60 mL/min (ref >60)
Glucose, Bld: 108 mg/dL — ABNORMAL HIGH (ref 65–99)
POTASSIUM: 4 mmol/L (ref 3.5–5.1)
SODIUM: 130 mmol/L — AB (ref 135–145)

## 2018-02-01 LAB — PREPARE RBC (CROSSMATCH)

## 2018-02-01 MED ORDER — OXYCODONE-ACETAMINOPHEN 5-325 MG PO TABS: 1.0000 | ORAL_TABLET | Freq: Three times a day (TID) | ORAL | 0 refills | Status: AC

## 2018-02-01 MED ORDER — RISPERIDONE 0.5 MG PO TABS: 0.5000 mg | ORAL_TABLET | Freq: Every evening | ORAL | 0 refills | Status: AC | PRN

## 2018-02-01 MED ORDER — OXYCODONE HCL 5 MG PO TABS: 5.0000 mg | ORAL_TABLET | Freq: Three times a day (TID) | ORAL | 0 refills | Status: DC

## 2018-02-01 MED ORDER — SODIUM CHLORIDE 0.9 % IV SOLN: Freq: Once | INTRAVENOUS | Status: DC

## 2018-02-01 MED ORDER — POLYETHYLENE GLYCOL 3350 17 G PO PACK: 17.0000 g | PACK | Freq: Every day | ORAL | 0 refills | Status: AC | PRN

## 2018-02-01 MED ORDER — BISACODYL 10 MG RE SUPP: 10.0000 mg | Freq: Every day | RECTAL | 0 refills | Status: AC | PRN

## 2018-02-01 MED ORDER — ENSURE ENLIVE PO LIQD: 237.0000 mL | Freq: Two times a day (BID) | ORAL | 12 refills | Status: AC

## 2018-02-01 MED ORDER — FENTANYL 25 MCG/HR TD PT72: 25.0000 ug | MEDICATED_PATCH | TRANSDERMAL | 0 refills | Status: AC

## 2018-02-01 MED ORDER — LUBIPROSTONE 24 MCG PO CAPS: 24.0000 ug | ORAL_CAPSULE | Freq: Every day | ORAL | 0 refills | Status: AC | PRN

## 2018-02-01 NOTE — Progress Notes (Signed)
Spoke with Dr. Marcille Blanco regarding pt's Hgb level trending down.  MD acknowledged.  No changes to patient's POC at this time.

## 2018-02-01 NOTE — Care Management (Addendum)
Plan is for patient to transition to home followed by [facility]. I reached out to on-call and they will have a nurse call me back.  Message also left patient's husband to make these arrangements and any DME needs/transportation needs. At 34 I spoke with patient's husband and he has talked with hospice nurse [name]. On-call Hospice RN call me back and they have an appointment with patient today at [facility] in the home. Husband states patient should be able to ride in a car and no DME needs at this time.  No other RNCM needs.

## 2018-02-01 NOTE — Progress Notes (Signed)
[facility]  ROUNDING NOTE   Subjective:  Patient seen at bedside. Serum sodium currently 130. Husband at bedside as well today. Still appears quite weak.  Objective:  Vital signs in last 24 hours:  Temp:  [97.5 F (36.4 C)-98.6 F (37 C)] 97.5 F (36.4 C) (04/19 1230) Pulse Rate:  [92-108] 94 (04/19 1230) Resp:  [16-20] 18 (04/19 1230) BP: (139-162)/(64-90) 139/77 (04/19 1230) SpO2:  [94 %-100 %] 96 % (04/19 1230)  Weight change:  Filed Weights   01/28/18 2335  Weight: 58.1 kg (128 lb)    Intake/Output: I/O last 3 completed shifts: In: 757.9 [P.O.:600; I.V.:157.9] Out: 950 [Urine:950]   Intake/Output this shift:  No intake/output data recorded.  Physical Exam: General: No acute distress  Head: Normocephalic, atraumatic. Moist oral mucosal membranes  Eyes: Anicteric  Neck: Supple, trachea midline  Lungs:  Clear to auscultation, normal effort  Heart: S1S2 no rubs  Abdomen:  Soft, nontender, bowel sounds present  Extremities: No peripheral edema.  Neurologic: Awake, alert, following commands  Skin: No lesions       Basic Metabolic Panel: Recent Labs  Lab 01/29/18 0518  01/30/18 0319  01/30/18 1654 01/31/18 0309 01/31/18 1517 01/31/18 1902 02/01/18 0329  NA 113*   < > 114*   < > 122*  122* 123* 128* 129* 130*  K 2.6*   < > 4.1  --  4.4 4.2 4.6  --  4.0  CL 81*   < > 87*  --  91* 94* 98*  --  100*  CO2 21*   < > 23  --  24 25 27   --  27  GLUCOSE 94   < > 101*  --  97 103* 108*  --  108*  BUN 11   < > 12  --  14 13 20   --  15  CREATININE 0.48   < > 0.57  --  0.57 0.70 0.64  --  0.51  CALCIUM 8.3*   < > 8.2*  --  8.4* 8.2* 8.5*  --  8.7*  MG 1.4*  --  1.7  --   --  2.0  --   --   --   PHOS 1.9*  --  2.5  --   --  2.9  --   --   --    < > = values in this interval not displayed.    Liver Function Tests: Recent Labs  Lab 01/28/18 2340  AST 25  ALT 14  ALKPHOS 61  BILITOT 0.4  PROT 6.4*  ALBUMIN 3.6   Recent Labs  Lab  01/28/18 2340  LIPASE 19   No results for input(s): AMMONIA in the last 168 hours.  CBC: Recent Labs  Lab 01/28/18 2340 01/29/18 0518 01/31/18 0309 02/01/18 0329  WBC 15.7* 13.2* 10.8 14.8*  HGB 10.5* 10.3* 7.3* 6.7*  HCT 28.8* 31.1* 22.2* 20.3*  MCV 70.3* 70.6* 71.5* 72.8*  PLT 794* 873* 731* 793*    Cardiac Enzymes: No results for input(s): CKTOTAL, CKMB, CKMBINDEX, TROPONINI in the last 168 hours.  BNP: Invalid input(s): POCBNP  CBG: No results for input(s): GLUCAP in the last 168 hours.  Microbiology: Results for orders placed or performed during the hospital encounter of 09/24/17  Blood Culture (routine x 2)     Status: None   Collection Time: 09/24/17  3:49 PM  Result Value Ref Range Status   Specimen Description   Final    BLOOD Blood Culture results may  not be optimal due to an inadequate volume of blood received in culture bottles   Special Requests LEFT ANTECUBITAL  Final   Culture NO GROWTH 5 DAYS  Final   Report Status 09/29/2017 FINAL  Final  Blood Culture (routine x 2)     Status: None   Collection Time: 09/24/17  4:39 PM  Result Value Ref Range Status   Specimen Description BLOOD LEFT AC  Final   Special Requests   Final    BOTTLES DRAWN AEROBIC AND ANAEROBIC Blood Culture adequate volume   Culture NO GROWTH 5 DAYS  Final   Report Status 09/29/2017 FINAL  Final  C difficile quick scan w PCR reflex     Status: Abnormal   Collection Time: 09/25/17  8:44 PM  Result Value Ref Range Status   C Diff antigen POSITIVE (A) NEGATIVE Final   C Diff toxin NEGATIVE NEGATIVE Final   C Diff interpretation Results are indeterminate. See PCR results.  Final  Clostridium Difficile by PCR     Status: Abnormal   Collection Time: 09/25/17  8:44 PM  Result Value Ref Range Status   Toxigenic C. Difficile by PCR POSITIVE (A) NEGATIVE Final    Comment: Positive for toxigenic C. difficile with little to no toxin production. Only treat if clinical presentation suggests  symptomatic illness.    Coagulation Studies: No results for input(s): LABPROT, INR in the last 72 hours.  Urinalysis: No results for input(s): COLORURINE, LABSPEC, PHURINE, GLUCOSEU, HGBUR, BILIRUBINUR, KETONESUR, PROTEINUR, UROBILINOGEN, NITRITE, LEUKOCYTESUR in the last 72 hours.  Invalid input(s): APPERANCEUR    Imaging: Ct Chest Wo Contrast  Result Date: 01/30/2018 CLINICAL DATA:  Hyponatremia.  Possible lung mass. EXAM: CT CHEST WITHOUT CONTRAST TECHNIQUE: Multidetector CT imaging of the chest was performed following the standard protocol without IV contrast. COMPARISON:  Radiograph of September 24, 2017. CT scan of July 27, 2005. FINDINGS: Cardiovascular: Atherosclerosis of thoracic aorta is noted without aneurysm formation. Coronary artery calcifications are noted. Normal cardiac size. Minimal pericardial effusion is noted. Mediastinum/Nodes: No enlarged mediastinal or axillary lymph nodes. Thyroid gland, trachea, and esophagus demonstrate no significant findings. Lungs/Pleura: No pneumothorax or pleural effusion is noted. Minimal bibasilar subsegmental atelectasis is noted. 4 mm nodule is noted in right lower lobe best seen on image number 87 of series 3. Upper Abdomen: No acute abnormality. Musculoskeletal: No chest wall mass or suspicious bone lesions identified. IMPRESSION: 4 mm nodule noted in right lower lobe. No follow-up needed if patient is low-risk. Non-contrast chest CT can be considered in 12 months if patient is high-risk. This recommendation follows the consensus statement: Guidelines for Management of Incidental Pulmonary Nodules Detected on CT Images: From the Fleischner Society 2017; Radiology 2017; 284:228-243. Coronary artery calcifications are noted suggesting coronary artery disease. Minimal pericardial effusion. Aortic Atherosclerosis (ICD10-I70.0). Electronically Signed   By: Marijo Conception, M.D.   On: 01/30/2018 13:07     Medications:   . sodium chloride     .  amLODipine  2.5 mg Oral Daily  . atorvastatin  10 mg Oral Daily  . clopidogrel  75 mg Oral Daily  . donepezil  10 mg Oral QHS  . feeding supplement (ENSURE ENLIVE)  237 mL Oral BID BM  . fentaNYL  25 mcg Transdermal Q72H  . mometasone-formoterol  2 puff Inhalation BID  . multivitamin with minerals  1 tablet Oral Daily  . oxyCODONE-acetaminophen  1 tablet Oral TID   And  . oxyCODONE  5 mg Oral TID  .  pantoprazole  40 mg Oral Daily  . senna-docusate  2 tablet Oral Daily   acetaminophen **OR** acetaminophen, bisacodyl, lubiprostone, morphine injection, ondansetron **OR** ondansetron (ZOFRAN) IV, oxyCODONE, polyethylene glycol, prochlorperazine, risperiDONE  Assessment/ Plan:  72 y.o. female with a PMHx of anxiety, chronic pain syndrome on opiates, chronic cervical spondylosis, depression, dementia, diverticulosis, GERD, hyperlipidemia, hypertension, prior history of CVA, who was admitted to [facility] on 01/29/2018 for evaluation of nausea, vomiting, poor p.o. Intake.   1.  Hyponatremia secondary to nausea, vomiting, poor p.o. intake. 2.  Hypokalemia. 3.  Constipation. 4.  Hypomagnesemia.  Plan:  Serum sodium has improved significantly.  Sodium up to 130.  Hold off on any further 3% saline administration.  Serum potassium acceptable at 4.0.  Most recent serum magnesium was also up to 2.0.  Treatment of constipation as per hospitalist otherwise.  Overall patient continues to have guarded prognosis.   LOS: 3 Davey Limas 4/19/201912:34 PM

## 2018-02-01 NOTE — Progress Notes (Signed)
Pt's previous fentanyl patch removed and placed destroyed with Rosalene Billings, RN as witness.

## 2018-02-01 NOTE — Progress Notes (Signed)
EMS called and told nurse that d/t weather conditions [place]. Told family and since his wife seemed better following her blood transfustion that he would take her home in personal vehicle. NTs helped patient to w/c and car.

## 2018-02-01 NOTE — Progress Notes (Signed)
Patient is being discharged home with hospice. DC & RX instructions given to [name], husband, and he acknowledged understanding. NTs prepared patient for transport via ems. IVs removed. EMS called.

## 2018-02-01 NOTE — Progress Notes (Signed)
Spoke with d/ Lateef regarding hypertonic saline rate and last Na lab. Na this AM was 123- up to 128 @ 1500 (drip had been running ~6 hours). Goal is for pt to be at 130 tomorrow AM. Do not want to increase Na more than 8 points in 24hr. Per Dr. Holley Raring decrease rate to 20ml/hr. Next Na check is due @ 1900 and q 4 hours. Per Dr. Holley Raring if next level is 130 turn drip off. Pharmacy will continue to monitor. RN informed of plan  Ramond Dial, Pharm.D, BCPS Clinical Pharmacist   4/19 0330 Na 130. Hypertonic saline d/c.  Sim Boast, PharmD, BCPS  02/01/18 6:07 AM

## 2018-02-01 NOTE — Care Management Important Message (Signed)
Important Message  Patient Details  Name: IYLAH DWORKIN MRN: [id number] Date of Birth: 03/01/1946   Medicare Important Message Given:  Yes    Juliann Pulse A Reid Nawrot 02/01/2018, 11:36 AM

## 2018-02-01 NOTE — Discharge Summary (Signed)
[facility] NAME: Aimee Jones    MR#:  [id number]  DATE OF BIRTH:  1946/04/02  DATE OF ADMISSION:  01/29/2018 ADMITTING PHYSICIAN: Amelia Jo, MD  DATE OF DISCHARGE: 02/01/18   PRIMARY CARE PHYSICIAN: Tonia Ghent, MD    ADMISSION DIAGNOSIS:  Hyponatremia [E87.1] Constipation, unspecified constipation type [K59.00] Non-intractable vomiting with nausea, unspecified vomiting type [R11.2]  DISCHARGE DIAGNOSIS:  Active Problems:   Hyponatremia   Protein-calorie malnutrition, severe   Weakness generalized   Palliative care by specialist   SECONDARY DIAGNOSIS:   Past Medical History:  Diagnosis Date  . Allergy   . Anxiety    panic attacks- on occas.   . Arthritis    osteoporosis - especially hips.   . Asthma   . Confusion   . Dementia   . Depression   . Diverticulosis   . ETOH abuse   . Falls   . GERD (gastroesophageal reflux disease)   . Hyperlipidemia   . Hypertension   . Hyponatremia 07/05/2015  . Memory loss   . Normal 24 hour ambulatory pH monitoring study    good acid suppression  . Observed seizure-like activity ([place]) 07/05/2015   observed by husband , Dr Doy Mince  . Seizures ([place])   . Shortness of breath   . Stroke [facility])     HOSPITAL COURSE:   HPI Aimee Jones  is a 72 y.o. female with a known history of HTN, HLD, GERD, IDA, essential thrombocytosis, asthma, DJD/OA, anx/dep, late stage Alzheimer's dementia who p/w 2d Hx N/V. Pt is disoriented and has severe memory impairment, and is not able to provide any pertinent Hx. Hx obtained from pt's husband (DPOA) at bedside. In addition to her typically complaints of chronic B/L shoulder pain (2/2 cervical spondylosis/radiculopathy), and a chronic headache (characterized as pain behind the eyes), she has been experiencing two days of nausea and vomiting. She has thrown up multiple times per day, though he is unable to quantify. Vomitus is  characterized as bilious and brown, though there was no hematemesis noted. The pt's husband states she has not eaten food in ~2d, but has been drinking plenty of water. He states she has not been eating well for ~80yrs, and believes she has lost 50-60lb in that time period. He states that she has also been having a staggering gait for ~1-65mo, which he states has gradually gotten worse. He has been helping her ambulate more recently, and she was previously (prior to 1-31mo ago) able to ambulate w/o assistance. She has not fallen. She has been constipated. There have been no reports of fever, chills, diarrhea, AP, CP, SOB, palpitations, diaphoresis, rigors, night sweats, blurred vision, focal neurological deficits, LH/LOC, urinary symptoms. Pt is DNR/DNI per husband.  1.  Acute on chronic hyponatremia-could be a combination of things.  Poor oral intake, increased free water intake.  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 -Appreciate nephrology consult.   patient is receiving 3% hypertonic saline at 25 cc/h  -Goal sodium increases 8-12 mEq per 24 hours. Na 123-128 --129 --130today  2.    Anemia hemoglobin at 6.7 today . family Prefers getting 1 unit of blood transition prior to discharge  3.  Constipation-laxatives added.  Monitor closely -CT with no bowel obstruction noted -Questionable pneumatosis on the CT abdomen evaluated by surgeon on admission and does not think this is perforation  4.  hypertension-continue Norvasc  5.  Chronic pain-continue pain medications  6.  DVT prophylaxis-on Lovenox  Discharge patient home with hospice care.  Patient and husband are agreeable  DISCHARGE CONDITIONS:   guarded  CONSULTS OBTAINED:  Treatment Team:  Arta Silence, MD Anthonette Legato, MD   PROCEDURES NONE  DRUG ALLERGIES:   Allergies  Allergen Reactions  . Tramadol Other (See Comments)    Would avoid.  History of seizure on medication  . Latex Rash  . Penicillins Rash     .Marland KitchenHas patient had a PCN reaction causing immediate rash, facial/tongue/throat swelling, SOB or lightheadedness with hypotension: No Has patient had a PCN reaction causing severe rash involving mucus membranes or skin necrosis: No Has patient had a PCN reaction that required hospitalization No Has patient had a PCN reaction occurring within the last 10 years: No If all of the above answers are "NO", then may proceed with Cephalosporin use.      DISCHARGE MEDICATIONS:   Allergies as of 02/01/2018      Reactions   Tramadol Other (See Comments)   Would avoid.  History of seizure on medication   Latex Rash   Penicillins Rash   .Marland KitchenHas patient had a PCN reaction causing immediate rash, facial/tongue/throat swelling, SOB or lightheadedness with hypotension: No Has patient had a PCN reaction causing severe rash involving mucus membranes or skin necrosis: No Has patient had a PCN reaction that required hospitalization No Has patient had a PCN reaction occurring within the last 10 years: No If all of the above answers are "NO", then may proceed with Cephalosporin use.      Medication List    STOP taking these medications   acetaminophen 325 MG tablet Commonly known as:  TYLENOL   atorvastatin 10 MG tablet Commonly known as:  LIPITOR   EYE VITAMINS Caps   FISH OIL PO   ibuprofen 200 MG tablet Commonly known as:  MOTRIN IB   oxyCODONE-acetaminophen 10-325 MG tablet Commonly known as:  PERCOCET Replaced by:  oxyCODONE-acetaminophen 5-325 MG tablet   vitamin E 400 UNIT capsule   Vitamins/Minerals Tabs     TAKE these medications   amLODipine 2.5 MG tablet Commonly known as:  NORVASC TAKE 1 TABLET BY MOUTH EVERY DAY   bisacodyl 10 MG suppository Commonly known as:  DULCOLAX Place 1 suppository (10 mg total) rectally daily as needed for moderate constipation.   citalopram 20 MG tablet Commonly known as:  CELEXA TAKE 1 TABLET BY MOUTH EVERY DAY   clopidogrel 75 MG  tablet Commonly known as:  PLAVIX TAKE 1 TABLET BY MOUTH EVERY DAY   diclofenac sodium 1 % Gel Commonly known as:  VOLTAREN Apply 4 g topically 4 (four) times daily as needed.   donepezil 10 MG tablet Commonly known as:  ARICEPT TAKE 1 TABLET BY MOUTH EVERYDAY AT BEDTIME   doxepin 25 MG capsule Commonly known as:  SINEQUAN TAKE 1 CAPSULE BY MOUTH EVERYDAY AT BEDTIME   feeding supplement (ENSURE ENLIVE) Liqd Take 237 mLs by mouth 2 (two) times daily between meals.   fentaNYL 25 MCG/HR patch Commonly known as:  DURAGESIC - dosed mcg/hr Place 1 patch (25 mcg total) onto the skin every 3 (three) days.   fluticasone 50 MCG/ACT nasal spray Commonly known as:  FLONASE Place into the nose.   hydrocortisone 2.5 % cream APPLY RECTALLY 2 TIMES DAILY   lubiprostone 24 MCG capsule Commonly known as:  AMITIZA Take 1 capsule (24 mcg total) by mouth daily as needed for constipation.   ondansetron 8 MG tablet Commonly known as:  ZOFRAN Take 8 mg by mouth every 8 (eight) hours as needed. What changed:  Another medication with the same name was removed. Continue taking this medication, and follow the directions you see here.   oxyCODONE 5 MG immediate release tablet Commonly known as:  Oxy IR/ROXICODONE Take 1 tablet (5 mg total) by mouth 3 (three) times daily.   oxyCODONE-acetaminophen 5-325 MG tablet Commonly known as:  PERCOCET/ROXICET Take 1 tablet by mouth 3 (three) times daily. Replaces:  oxyCODONE-acetaminophen 10-325 MG tablet   pantoprazole 40 MG tablet Commonly known as:  PROTONIX Take 1 tablet (40 mg total) by mouth daily.   polyethylene glycol packet Commonly known as:  MIRALAX / GLYCOLAX Take 17 g by mouth daily as needed for mild constipation.   polyvinyl alcohol 1.4 % ophthalmic solution Commonly known as:  LIQUIFILM TEARS Place 1 drop into both eyes as needed.   risperiDONE 0.5 MG tablet Commonly known as:  RISPERDAL Take 1 tablet (0.5 mg total) by mouth at  bedtime as needed (insomnia).   SYMBICORT 160-4.5 MCG/ACT inhaler Generic drug:  budesonide-formoterol INHALE 2 PUFFS INTO THE LUNGS 2 (TWO) TIMES DAILY AS NEEDED (SHORTNESS OF BREATH).        DISCHARGE INSTRUCTIONS:   Follow-up with primary care physician as needed  Continue hospice care at home  DIET:  Regular diet  DISCHARGE CONDITION:  Fair  ACTIVITY:  Activity as tolerated  OXYGEN:  Home Oxygen: No.   Oxygen Delivery: room air  DISCHARGE LOCATION:  home   If you experience worsening of your admission symptoms, develop shortness of breath, life threatening emergency, suicidal or homicidal thoughts you must seek medical attention immediately by calling 911 or calling your MD immediately  if symptoms less severe.  You Must read complete instructions/literature along with all the possible adverse reactions/side effects for all the Medicines you take and that have been prescribed to you. Take any new Medicines after you have completely understood and accpet all the possible adverse reactions/side effects.   Please note  You were cared for by a hospitalist during your hospital stay. If you have any questions about your discharge medications or the care you received while you were in the hospital after you are discharged, you can call the unit and asked to speak with the hospitalist on call if the hospitalist that took care of you is not available. Once you are discharged, your primary care physician will handle any further medical issues. Please note that NO REFILLS for any discharge medications will be authorized once you are discharged, as it is imperative that you return to your primary care physician (or establish a relationship with a primary care physician if you do not have one) for your aftercare needs so that they can reassess your need for medications and monitor your lab values.     Today  Chief Complaint  Patient presents with  . Emesis    Pt is very week  and frial.  She prefers going home as soon as possible.  Husband at bedside agreeable  ROS:  CONSTITUTIONAL: Reporting weakness and tiredness RESPIRATORY: Denies cough, wheeze, shortness of breath.  CARDIOVASCULAR: Denies chest pain, palpitations, edema.  GASTROINTESTINAL: Denies nausea, vomiting, diarrhea, abdominal pain. Denies bright red blood per rectum. HEMATOLOGIC AND LYMPHATIC: Denies easy bruising or bleeding. SKIN: Denies rash or lesion. MUSCULOSKELETAL: Patient has chronic pain  nEUROLOGIC: Denies paralysis, paresthesias.  PSYCHIATRIC: Denies anxiety or depressive symptoms.   VITAL SIGNS:  Blood pressure 139/77, pulse 94, temperature (!) 97.5  F (36.4 C), temperature source Oral, resp. rate 18, height 5\' 7"  (1.702 m), weight 58.1 kg (128 lb), SpO2 96 %.  I/O:    Intake/Output Summary (Last 24 hours) at 02/01/2018 1355 Last data filed at 02/01/2018 1336 Gross per 24 hour  Intake 517.92 ml  Output 650 ml  Net -132.08 ml    PHYSICAL EXAMINATION:  GENERAL:  72 y.o.-year-old patient lying in the bed with no acute distress.  EYES: Pupils equal, round, reactive to light and accommodation. No scleral icterus. Extraocular muscles intact.  Pale looking HEENT: Head atraumatic, normocephalic. Oropharynx and nasopharynx clear.  NECK:  Supple, no jugular venous distention. No thyroid enlargement, no tenderness.  LUNGS: Normal breath sounds bilaterally, no wheezing, rales,rhonchi or crepitation. No use of accessory muscles of respiration.  CARDIOVASCULAR: S1, S2 normal. No murmurs, rubs, or gallops.  ABDOMEN: Soft, non-tender, non-distended. Bowel sounds present. EXTREMITIES: No pedal edema, cyanosis, or clubbing.  NEUROLOGIC: Cranial nerves II through XII are intact. Muscle strength generalized weakness in all extremities. Sensation intact. Gait not checked.  PSYCHIATRIC: The patient is alert and oriented x 3.  SKIN: No obvious rash, lesion, or ulcer.   DATA REVIEW:    CBC Recent Labs  Lab 02/01/18 0329  WBC 14.8*  HGB 6.7*  HCT 20.3*  PLT 793*    Chemistries  Recent Labs  Lab 01/28/18 2340  01/31/18 0309  02/01/18 0329  NA 109*   < > 123*   < > 130*  K 2.9*   < > 4.2   < > 4.0  CL 74*   < > 94*   < > 100*  CO2 23   < > 25   < > 27  GLUCOSE 111*   < > 103*   < > 108*  BUN 12   < > 13   < > 15  CREATININE 0.57   < > 0.70   < > 0.51  CALCIUM 8.7*   < > 8.2*   < > 8.7*  MG  --    < > 2.0  --   --   AST 25  --   --   --   --   ALT 14  --   --   --   --   ALKPHOS 61  --   --   --   --   BILITOT 0.4  --   --   --   --    < > = values in this interval not displayed.    Cardiac Enzymes No results for input(s): TROPONINI in the last 168 hours.  Microbiology Results  Results for orders placed or performed during the hospital encounter of 09/24/17  Blood Culture (routine x 2)     Status: None   Collection Time: 09/24/17  3:49 PM  Result Value Ref Range Status   Specimen Description   Final    BLOOD Blood Culture results may not be optimal due to an inadequate volume of blood received in culture bottles   Special Requests LEFT ANTECUBITAL  Final   Culture NO GROWTH 5 DAYS  Final   Report Status 09/29/2017 FINAL  Final  Blood Culture (routine x 2)     Status: None   Collection Time: 09/24/17  4:39 PM  Result Value Ref Range Status   Specimen Description BLOOD LEFT AC  Final   Special Requests   Final    BOTTLES DRAWN AEROBIC AND ANAEROBIC Blood Culture adequate volume   Culture  NO GROWTH 5 DAYS  Final   Report Status 09/29/2017 FINAL  Final  C difficile quick scan w PCR reflex     Status: Abnormal   Collection Time: 09/25/17  8:44 PM  Result Value Ref Range Status   C Diff antigen POSITIVE (A) NEGATIVE Final   C Diff toxin NEGATIVE NEGATIVE Final   C Diff interpretation Results are indeterminate. See PCR results.  Final  Clostridium Difficile by PCR     Status: Abnormal   Collection Time: 09/25/17  8:44 PM  Result Value Ref Range  Status   Toxigenic C. Difficile by PCR POSITIVE (A) NEGATIVE Final    Comment: Positive for toxigenic C. difficile with little to no toxin production. Only treat if clinical presentation suggests symptomatic illness.    RADIOLOGY:  Ct Chest Wo Contrast  Result Date: 01/30/2018 CLINICAL DATA:  Hyponatremia.  Possible lung mass. EXAM: CT CHEST WITHOUT CONTRAST TECHNIQUE: Multidetector CT imaging of the chest was performed following the standard protocol without IV contrast. COMPARISON:  Radiograph of September 24, 2017. CT scan of July 27, 2005. FINDINGS: Cardiovascular: Atherosclerosis of thoracic aorta is noted without aneurysm formation. Coronary artery calcifications are noted. Normal cardiac size. Minimal pericardial effusion is noted. Mediastinum/Nodes: No enlarged mediastinal or axillary lymph nodes. Thyroid gland, trachea, and esophagus demonstrate no significant findings. Lungs/Pleura: No pneumothorax or pleural effusion is noted. Minimal bibasilar subsegmental atelectasis is noted. 4 mm nodule is noted in right lower lobe best seen on image number 87 of series 3. Upper Abdomen: No acute abnormality. Musculoskeletal: No chest wall mass or suspicious bone lesions identified. IMPRESSION: 4 mm nodule noted in right lower lobe. No follow-up needed if patient is low-risk. Non-contrast chest CT can be considered in 12 months if patient is high-risk. This recommendation follows the consensus statement: Guidelines for Management of Incidental Pulmonary Nodules Detected on CT Images: From the Fleischner Society 2017; Radiology 2017; 284:228-243. Coronary artery calcifications are noted suggesting coronary artery disease. Minimal pericardial effusion. Aortic Atherosclerosis (ICD10-I70.0). Electronically Signed   By: Marijo Conception, M.D.   On: 01/30/2018 13:07   Ct Abdomen Pelvis W Contrast  Result Date: 01/29/2018 CLINICAL DATA:  Acute onset of nausea, vomiting and diarrhea. EXAM: CT ABDOMEN AND PELVIS  WITH CONTRAST TECHNIQUE: Multidetector CT imaging of the abdomen and pelvis was performed using the standard protocol following bolus administration of intravenous contrast. CONTRAST:  7mL OMNIPAQUE IOHEXOL 300 MG/ML  SOLN COMPARISON:  CT of the abdomen and pelvis performed 09/24/2017, and MRCP performed 09/27/2017. Right upper quadrant ultrasound performed 09/26/2017 FINDINGS: Lower chest: Scattered coronary artery calcifications are seen. The visualized lung bases are clear. Hepatobiliary: Scattered cystic lesions noted within the liver, measuring up to 2.4 cm in size. The liver is otherwise unremarkable. The gallbladder is unremarkable in appearance. The common bile duct remains normal in caliber. Pancreas: The pancreas is within normal limits. Spleen: The spleen is unremarkable in appearance. Adrenals/Urinary Tract: The adrenal glands are unremarkable in appearance. The kidneys are within normal limits. There is no evidence of hydronephrosis. No renal or ureteral stones are identified. No perinephric stranding is seen. Stomach/Bowel: The cecum is filled with stool. The appendix is not definitely seen; there is no evidence for appendicitis. Pneumatosis is suggested along the sigmoid colon. The small bowel is grossly unremarkable. The stomach is within normal limits. Vascular/Lymphatic: Scattered calcification is seen along the abdominal aorta and its branches. The abdominal aorta is otherwise grossly unremarkable. The inferior vena cava is grossly unremarkable. No retroperitoneal  lymphadenopathy is seen. No pelvic sidewall lymphadenopathy is identified. Reproductive: The bladder is mildly distended and grossly unremarkable. The patient is status post hysterectomy. No suspicious adnexal masses are seen. Other: No additional soft tissue abnormalities are seen. Musculoskeletal: No acute osseous abnormalities are identified. Bilateral hip arthroplasties are grossly unremarkable in appearance, though incompletely  imaged. The visualized musculature is unremarkable in appearance. IMPRESSION: 1. Cecum filled with stool, with additional stool noted along the colon. This may reflect mild constipation. 2. Pneumatosis suggested along the sigmoid colon, of uncertain significance. 3. Scattered cystic lesions within the liver, measuring up to 2.4 cm, are likely benign. 4. Scattered coronary artery calcifications. Aortic Atherosclerosis (ICD10-I70.0). Electronically Signed   By: Garald Balding M.D.   On: 01/29/2018 01:59    EKG:   Orders placed or performed during the hospital encounter of 09/24/17  . EKG 12-Lead  . EKG 12-Lead  . EKG      Management plans discussed with the patient, family and they are in agreement.  CODE STATUS:     Code Status Orders  (From admission, onward)        Start     Ordered   01/29/18 0426  Do not attempt resuscitation (DNR)  Continuous    Question Answer Comment  In the event of cardiac or respiratory ARREST Do not call a "code blue"   In the event of cardiac or respiratory ARREST Do not perform Intubation, CPR, defibrillation or ACLS   In the event of cardiac or respiratory ARREST Use medication by any route, position, wound care, and other measures to relive pain and suffering. May use oxygen, suction and manual treatment of airway obstruction as needed for comfort.      01/29/18 0427    Code Status History    Date Active Date Inactive Code Status Order ID Comments User Context   01/29/2018 0331 01/29/2018 0427 Full Code [id number]  Arta Silence, MD ED   09/24/2017 1311 09/27/2017 2252 Full Code [id number]  Epifanio Lesches, MD ED   08/01/2016 1345 08/01/2016 1848 Full Code [id number]  Milly Jakob, MD Inpatient   07/04/2015 2358 07/07/2015 1930 Full Code [id number]  Theressa Millard, MD ED   06/22/2015 2021 06/29/2015 1822 Full Code [id number]  Truett Mainland, DO Inpatient   10/14/2014 1124 10/14/2014 1507 Full Code [id number]  Evans Lance, MD Inpatient    10/12/2014 1842 10/14/2014 1124 Full Code [id number]  Verlee Monte, MD ED   08/13/2014 1228 08/14/2014 1506 Full Code [id number]  Jenetta Loges, PA-C Inpatient    Advance Directive Documentation     Most Recent Value  Type of Advance Directive  Healthcare Power of Attorney, Living will, Out of facility DNR (pink MOST or yellow form)  Pre-existing out of facility DNR order (yellow form or pink MOST form)  Physician notified to receive inpatient order  "MOST" Form in Place?  -      TOTAL TIME TAKING CARE OF THIS PATIENT: 43  minutes.   Note: This dictation was prepared with Dragon dictation along with smaller phrase technology. Any transcriptional errors that result from this process are unintentional.   @[facility] @  on 02/01/2018 at 1:55 PM  Between 7am to 6pm - Pager - [phone]  After 6pm go to [email or web address] - password [email or web address] [facility]  Office  [phone]  CC: Primary care physician; Tonia Ghent, MD

## 2018-02-01 NOTE — Progress Notes (Signed)
Upon taking patient to restroom, pt asking for pain medication again at this time.  Pt slow to answer questions and appearing groggy.  Pt slow to follow commands, patient advised that there is no medication able to be given at this time.  Pt verbalized understanding.  Pt asking repeated questions about when she will be discharged and where her husband is.  Pt able to be verbally redirected with help of son at this time.

## 2018-02-01 NOTE — Progress Notes (Signed)
[facility] Liaison:  RN visit  Notified by Lattie Haw [facility], at [phone], of patient/family request for Hospice and [place] services at home after discharge.  Chart and patient information were reviewed with a [place].  Hospice eligibility approved by Dr. Loletha Grayer. [place].   Writer spoke with Gwyndolyn Saxon, husband, over the phone and will see him at bedside to initiate education related to hospice philosophy, services and team approach to care.  Patient/family verbalized understanding of information given.  Per discussion, plan is for discharge to home by [place] on 02/01/18.  Patient to receive blood transfusion before she will be able to be released home.  I will follow up with bedside RN throughout the day to get a better time for discharge.   Please send signed and completed DNR form home with patient/family.  Patient will need prescriptions for discharge comfort medications.  DME needs have been discussed, patient currently has the following equipment in the home:  Walker and bedside commode.  Patient/family requests the following DME for delivery to the home:  none.    [facility] is aware of the above.  Completed discharge summary will need to be faxed to [facility] at [phone], when final.  Please notify [facility] when patient is ready to leave the unit at discharge.  (Call [phone] or [phone] if its after 5pm.)  [facility] information and contact numbers will be given to [place], husband, during visit.  Above information shared with Lattie Haw, [facility].  Please call with any hospice related questions.  Thank you for the referral.  Edyth Gunnels, RN, [facility] Liaison [phone]  All hospital liaisons are now on [place].

## 2018-02-02 LAB — TYPE AND SCREEN
ABO/RH(D): O POS
Antibody Screen: NEGATIVE
Unit division: 0

## 2018-02-02 LAB — BPAM RBC
Blood Product Expiration Date: 201904232359
ISSUE DATE / TIME: 201904191201
Unit Type and Rh: 9500

## 2018-02-07 DIAGNOSIS — G5600 Carpal tunnel syndrome, unspecified upper limb: Secondary | ICD-10-CM | POA: Diagnosis not present

## 2018-02-10 ENCOUNTER — Encounter: Payer: Self-pay | Admitting: Family Medicine

## 2018-02-10 ENCOUNTER — Other Ambulatory Visit: Payer: Self-pay | Admitting: Family Medicine

## 2018-02-10 DIAGNOSIS — Z66 Do not resuscitate: Secondary | ICD-10-CM | POA: Insufficient documentation

## 2018-02-10 DIAGNOSIS — Z515 Encounter for palliative care: Secondary | ICD-10-CM | POA: Insufficient documentation

## 2018-02-11 ENCOUNTER — Ambulatory Visit: Payer: Medicare Other | Admitting: Neurology

## 2018-02-11 ENCOUNTER — Encounter

## 2018-02-18 NOTE — Progress Notes (Deleted)
[facility]  [phone] [phone]  ID: Aimee Jones OB: 11-Jul-1946  MR#: [id number]  OVZ#:[id number]  Patient Care Team: Tonia Ghent, MD as PCP - General (Family Medicine) Justice Britain, MD as Consulting Physician (Orthopedic Surgery)  CHIEF COMPLAINT: Leukocytosis, iron deficiency anemia, thrombocytosis.  INTERVAL HISTORY: Patient returns to clinic today for repeat laboratory work and further evaluation.  She continues to have ongoing dementia and is a poor historian.  Much of the history and information is given by her husband. She currently feels well and is at her baseline. She has no neurological complaints. She has a fair appetite, but denies weight loss. She has no chest pain or shortness of breath. She continues to have chronic weakness and fatigue. She denies any nausea, vomiting, constipation, or diarrhea. She has no melena or hematochezia. She has no urinary complaints. Patient offers no further specific complaints today.  REVIEW OF SYSTEMS:   Review of Systems  Constitutional: Positive for malaise/fatigue. Negative for fever and weight loss.  HENT: Negative.  Negative for congestion and hearing loss.   Respiratory: Negative.  Negative for cough and shortness of breath.   Cardiovascular: Negative.  Negative for chest pain and leg swelling.  Gastrointestinal: Negative for abdominal pain, blood in stool, constipation, diarrhea, melena, nausea and vomiting.  Genitourinary: Negative.   Musculoskeletal: Negative.  Negative for back pain, joint pain and neck pain.  Skin: Negative.  Negative for rash.  Neurological: Positive for weakness. Negative for headaches.  Psychiatric/Behavioral: Positive for memory loss. The patient is not nervous/anxious.     As per HPI. Otherwise, a complete review of systems is negative.  PAST MEDICAL HISTORY: Past Medical History:  Diagnosis Date  . Allergy   . Anxiety    panic attacks- on occas.     . Arthritis    osteoporosis - especially hips.   . Asthma   . Confusion   . Dementia   . Depression   . Diverticulosis   . ETOH abuse   . Falls   . GERD (gastroesophageal reflux disease)   . Hyperlipidemia   . Hypertension   . Hyponatremia 07/05/2015  . Memory loss   . Normal 24 hour ambulatory pH monitoring study    good acid suppression  . Observed seizure-like activity ([place]) 07/05/2015   observed by husband , Dr Doy Mince  . Seizures ([place])   . Shortness of breath   . Stroke [facility])     PAST SURGICAL HISTORY: Past Surgical History:  Procedure Laterality Date  . ABDOMINAL HYSTERECTOMY    . BREAST SURGERY     3x- biposies   . COLONOSCOPY WITH PROPOFOL N/A 08/05/2015   Procedure: COLONOSCOPY WITH PROPOFOL;  Surgeon: [name], MD;  Location: [facility];  Service: Endoscopy;  Laterality: N/A;  . DOBUTAMINE STRESS ECHO  07/09/2001   normal  . ESOPHAGOGASTRODUODENOSCOPY  06/11/2006   normal  . ESOPHAGOGASTRODUODENOSCOPY (EGD) WITH PROPOFOL N/A 08/05/2015   Procedure: ESOPHAGOGASTRODUODENOSCOPY (EGD) WITH PROPOFOL;  Surgeon: [name], MD;  Location: [facility];  Service: Endoscopy;  Laterality: N/A;  . EYE SURGERY     cataracts removed, IOL- both eyes   . JOINT REPLACEMENT     bilateral hips  . LOOP RECORDER IMPLANT N/A 10/14/2014   Procedure: LOOP RECORDER IMPLANT;  Surgeon: Evans Lance, MD;  Location: [facility] CATH LAB;  Service: Cardiovascular;  Laterality: N/A;  . NASAL SINUS SURGERY  1990  . REVERSE SHOULDER ARTHROPLASTY Right 08/13/2014   Procedure: REVERSE  SHOULDER ARTHROPLASTY;  Surgeon: Marin Shutter, MD;  Location: [place];  Service: Orthopedics;  Laterality: Right;  interscalene block  . SHOULDER ARTHROSCOPY Left   . TEE WITHOUT CARDIOVERSION N/A 10/14/2014   Procedure: TRANSESOPHAGEAL ECHOCARDIOGRAM (TEE);  Surgeon: Sueanne Margarita, MD;  Location: [place];  Service: Cardiovascular;  Laterality: N/A;  . TOTAL HIP ARTHROPLASTY     left 1991/right  2008  . ULNAR NERVE TRANSPOSITION Right 08/01/2016   Procedure: RIGHT ULNAR NEUROPLASTY AT THE ELBOW;  Surgeon: Milly Jakob, MD;  Location: [place];  Service: Orthopedics;  Laterality: Right;    FAMILY HISTORY Family History  Problem Relation Age of Onset  . Heart block Mother   . Stroke Mother   . Heart attack Father   . Sudden death Brother   . Heart attack Brother   . Hypertension Brother   . Esophageal cancer Brother   . Stroke Brother   . Stomach cancer Neg Hx   . Colon cancer Neg Hx        ADVANCED DIRECTIVES:    HEALTH MAINTENANCE: Social History   Tobacco Use  . Smoking status: Never Smoker  . Smokeless tobacco: Never Used  Substance Use Topics  . Alcohol use: No    Alcohol/week: 0.0 oz  . Drug use: No     Allergies  Allergen Reactions  . Tramadol Other (See Comments)    Would avoid.  History of seizure on medication  . Latex Rash  . Penicillins Rash    .Marland KitchenHas patient had a PCN reaction causing immediate rash, facial/tongue/throat swelling, SOB or lightheadedness with hypotension: No Has patient had a PCN reaction causing severe rash involving mucus membranes or skin necrosis: No Has patient had a PCN reaction that required hospitalization No Has patient had a PCN reaction occurring within the last 10 years: No If all of the above answers are "NO", then may proceed with Cephalosporin use.      Current Outpatient Medications  Medication Sig Dispense Refill  . amLODipine (NORVASC) 2.5 MG tablet TAKE 1 TABLET BY MOUTH EVERY DAY 30 tablet 5  . bisacodyl (DULCOLAX) 10 MG suppository Place 1 suppository (10 mg total) rectally daily as needed for moderate constipation. 12 suppository 0  . citalopram (CELEXA) 20 MG tablet TAKE 1 TABLET BY MOUTH EVERY DAY 90 tablet 1  . clopidogrel (PLAVIX) 75 MG tablet TAKE 1 TABLET BY MOUTH EVERY DAY 90 tablet 1  . diclofenac sodium (VOLTAREN) 1 % GEL Apply 4 g topically 4 (four) times daily as needed.      . donepezil (ARICEPT) 10 MG tablet TAKE 1 TABLET BY MOUTH EVERYDAY AT BEDTIME 90 tablet 1  . doxepin (SINEQUAN) 25 MG capsule TAKE 1 CAPSULE BY MOUTH EVERYDAY AT BEDTIME 90 capsule 1  . feeding supplement, ENSURE ENLIVE, (ENSURE ENLIVE) LIQD Take 237 mLs by mouth 2 (two) times daily between meals. 237 mL 12  . fentaNYL (DURAGESIC - DOSED MCG/HR) 25 MCG/HR patch Place 1 patch (25 mcg total) onto the skin every 3 (three) days. 5 patch 0  . fluticasone (FLONASE) 50 MCG/ACT nasal spray Place into the nose.    . hydrocortisone 2.5 % cream APPLY RECTALLY 2 TIMES DAILY 28.35 g 0  . lubiprostone (AMITIZA) 24 MCG capsule Take 1 capsule (24 mcg total) by mouth daily as needed for constipation. 30 capsule 0  . ondansetron (ZOFRAN) 8 MG tablet Take 8 mg by mouth every 8 (eight) hours as needed.     Marland Kitchen oxyCODONE-acetaminophen (  PERCOCET/ROXICET) 5-325 MG tablet Take 1 tablet by mouth 3 (three) times daily. 30 tablet 0  . pantoprazole (PROTONIX) 40 MG tablet Take 1 tablet (40 mg total) by mouth daily. 90 tablet 3  . polyethylene glycol (MIRALAX / GLYCOLAX) packet Take 17 g by mouth daily as needed for mild constipation. 14 each 0  . polyvinyl alcohol (LIQUIFILM TEARS) 1.4 % ophthalmic solution Place 1 drop into both eyes as needed.     . risperiDONE (RISPERDAL) 0.5 MG tablet Take 1 tablet (0.5 mg total) by mouth at bedtime as needed (insomnia). 15 tablet 0  . SYMBICORT 160-4.5 MCG/ACT inhaler INHALE 2 PUFFS INTO THE LUNGS 2 (TWO) TIMES DAILY AS NEEDED (SHORTNESS OF BREATH). 10.2 Inhaler 7   No current facility-administered medications for this visit.     OBJECTIVE: There were no vitals filed for this visit.   There is no height or weight on file to calculate BMI.    ECOG FS:2 - Symptomatic, <50% confined to bed  General: Very pale, well-developed, well-nourished, no acute distress. Eyes: Pink conjunctiva, anicteric sclera, puffiness under bilateral eyes Lungs: Diminished breath sounds bilaterally. Heart:  Regular rate and rhythm. No rubs, murmurs, or gallops. Abdomen: Soft, nontender, nondistended. No organomegaly noted, normoactive bowel sounds. Musculoskeletal: No edema, cyanosis, or clubbing. Neuro: Alert, occasionally unable to follow conversation, slow responses. Cranial nerves grossly intact. Skin: No rashes or petechiae noted. Psych: Normal affect. Short-term memory loss.   LAB RESULTS:  Lab Results  Component Value Date   NA 130 (L) 02/01/2018   K 4.0 02/01/2018   CL 100 (L) 02/01/2018   CO2 27 02/01/2018   GLUCOSE 108 (H) 02/01/2018   BUN 15 02/01/2018   CREATININE 0.51 02/01/2018   CALCIUM 8.7 (L) 02/01/2018   PROT 6.4 (L) 01/28/2018   ALBUMIN 3.6 01/28/2018   AST 25 01/28/2018   ALT 14 01/28/2018   ALKPHOS 61 01/28/2018   BILITOT 0.4 01/28/2018   GFRNONAA >60 02/01/2018   GFRAA >60 02/01/2018    Lab Results  Component Value Date   WBC 14.8 (H) 02/01/2018   NEUTROABS 5.5 10/11/2017   HGB 6.7 (L) 02/01/2018   HCT 20.3 (L) 02/01/2018   MCV 72.8 (L) 02/01/2018   PLT 793 (H) 02/01/2018   Lab Results  Component Value Date   IRON 291 (H) 09/26/2017   TIBC 163 (L) 09/26/2017   IRONPCTSAT 179 (H) 09/26/2017    Lab Results  Component Value Date   FERRITIN 42 09/26/2017     STUDIES: Ct Chest Wo Contrast  Result Date: 01/30/2018 CLINICAL DATA:  Hyponatremia.  Possible lung mass. EXAM: CT CHEST WITHOUT CONTRAST TECHNIQUE: Multidetector CT imaging of the chest was performed following the standard protocol without IV contrast. COMPARISON:  Radiograph of September 24, 2017. CT scan of July 27, 2005. FINDINGS: Cardiovascular: Atherosclerosis of thoracic aorta is noted without aneurysm formation. Coronary artery calcifications are noted. Normal cardiac size. Minimal pericardial effusion is noted. Mediastinum/Nodes: No enlarged mediastinal or axillary lymph nodes. Thyroid gland, trachea, and esophagus demonstrate no significant findings. Lungs/Pleura: No pneumothorax  or pleural effusion is noted. Minimal bibasilar subsegmental atelectasis is noted. 4 mm nodule is noted in right lower lobe best seen on image number 87 of series 3. Upper Abdomen: No acute abnormality. Musculoskeletal: No chest wall mass or suspicious bone lesions identified. IMPRESSION: 4 mm nodule noted in right lower lobe. No follow-up needed if patient is low-risk. Non-contrast chest CT can be considered in 12 months if patient is high-risk. This  recommendation follows the consensus statement: Guidelines for Management of Incidental Pulmonary Nodules Detected on CT Images: From the Fleischner Society 2017; Radiology 2017; 284:228-243. Coronary artery calcifications are noted suggesting coronary artery disease. Minimal pericardial effusion. Aortic Atherosclerosis (ICD10-I70.0). Electronically Signed   By: Marijo Conception, M.D.   On: 01/30/2018 13:07   Ct Abdomen Pelvis W Contrast  Result Date: 01/29/2018 CLINICAL DATA:  Acute onset of nausea, vomiting and diarrhea. EXAM: CT ABDOMEN AND PELVIS WITH CONTRAST TECHNIQUE: Multidetector CT imaging of the abdomen and pelvis was performed using the standard protocol following bolus administration of intravenous contrast. CONTRAST:  31mL OMNIPAQUE IOHEXOL 300 MG/ML  SOLN COMPARISON:  CT of the abdomen and pelvis performed 09/24/2017, and MRCP performed 09/27/2017. Right upper quadrant ultrasound performed 09/26/2017 FINDINGS: Lower chest: Scattered coronary artery calcifications are seen. The visualized lung bases are clear. Hepatobiliary: Scattered cystic lesions noted within the liver, measuring up to 2.4 cm in size. The liver is otherwise unremarkable. The gallbladder is unremarkable in appearance. The common bile duct remains normal in caliber. Pancreas: The pancreas is within normal limits. Spleen: The spleen is unremarkable in appearance. Adrenals/Urinary Tract: The adrenal glands are unremarkable in appearance. The kidneys are within normal limits. There is no  evidence of hydronephrosis. No renal or ureteral stones are identified. No perinephric stranding is seen. Stomach/Bowel: The cecum is filled with stool. The appendix is not definitely seen; there is no evidence for appendicitis. Pneumatosis is suggested along the sigmoid colon. The small bowel is grossly unremarkable. The stomach is within normal limits. Vascular/Lymphatic: Scattered calcification is seen along the abdominal aorta and its branches. The abdominal aorta is otherwise grossly unremarkable. The inferior vena cava is grossly unremarkable. No retroperitoneal lymphadenopathy is seen. No pelvic sidewall lymphadenopathy is identified. Reproductive: The bladder is mildly distended and grossly unremarkable. The patient is status post hysterectomy. No suspicious adnexal masses are seen. Other: No additional soft tissue abnormalities are seen. Musculoskeletal: No acute osseous abnormalities are identified. Bilateral hip arthroplasties are grossly unremarkable in appearance, though incompletely imaged. The visualized musculature is unremarkable in appearance. IMPRESSION: 1. Cecum filled with stool, with additional stool noted along the colon. This may reflect mild constipation. 2. Pneumatosis suggested along the sigmoid colon, of uncertain significance. 3. Scattered cystic lesions within the liver, measuring up to 2.4 cm, are likely benign. 4. Scattered coronary artery calcifications. Aortic Atherosclerosis (ICD10-I70.0). Electronically Signed   By: Garald Balding M.D.   On: 01/29/2018 01:59    ASSESSMENT: Leukocytosis, iron deficiency anemia, thrombocytosis.  PLAN:    1. Iron deficiency anemia: Patient's hemoglobin and iron stores continue to be within normal limits. Previously, the remainder of her laboratory work was also negative or within normal limits. Colonoscopy and EGD in October 2016 were unrevealing. She does not require IV iron today. Patient last received 510 mg IV Feraheme on May 16, 2016.  Patient's and her husband continue to request less frequent follow-up, therefore she will return to clinic in 6 months with repeat laboratory work and further evaluation.  2. Leukocytosis: Resolved. Peripheral blood flow cytometry as well as BCR-ABL mutation are negative. Monitor. 3. Thrombocytosis: Platelets greater than 600 today. JAK-2 mutation negative. Monitor. 4. Hypertension: Resolved. Patient's blood pressure is normal today. Continue current medications.   Patient expressed understanding and was in agreement with this plan. She also understands that She can call clinic at any time with any questions, concerns, or complaints.    Lloyd Huger, MD 02/18/18 11:50 PM

## 2018-02-21 ENCOUNTER — Telehealth: Payer: Self-pay | Admitting: *Deleted

## 2018-02-21 ENCOUNTER — Inpatient Hospital Stay: Payer: Medicare Other | Admitting: Oncology

## 2018-02-21 ENCOUNTER — Inpatient Hospital Stay: Payer: Medicare Other

## 2018-02-21 NOTE — Telephone Encounter (Signed)
Husband called to cancel her appointment today stating she is bedridden and under hospice care now

## 2018-02-22 DIAGNOSIS — G894 Chronic pain syndrome: Secondary | ICD-10-CM | POA: Diagnosis not present

## 2018-02-22 DIAGNOSIS — Z79891 Long term (current) use of opiate analgesic: Secondary | ICD-10-CM | POA: Diagnosis not present

## 2018-02-22 DIAGNOSIS — M19019 Primary osteoarthritis, unspecified shoulder: Secondary | ICD-10-CM | POA: Diagnosis not present

## 2018-02-22 DIAGNOSIS — G5600 Carpal tunnel syndrome, unspecified upper limb: Secondary | ICD-10-CM | POA: Diagnosis not present

## 2018-02-22 DIAGNOSIS — Z79899 Other long term (current) drug therapy: Secondary | ICD-10-CM | POA: Diagnosis not present

## 2018-02-22 DIAGNOSIS — M47812 Spondylosis without myelopathy or radiculopathy, cervical region: Secondary | ICD-10-CM | POA: Diagnosis not present

## 2018-03-14 ENCOUNTER — Telehealth: Payer: Self-pay | Admitting: Family Medicine

## 2018-03-14 NOTE — Telephone Encounter (Signed)
Noted.  I'll await notes from hospice and palliative care.  I appreciate the help of all involved.   Please let husband know that I'll be thinking about both of them.   Thanks.

## 2018-03-14 NOTE — Telephone Encounter (Signed)
Copied from [place] [phone]. Topic: General - Other >> Mar 14, 2018 11:24 AM Lennox Solders wrote: Reason for CRM:pt is being transfer to [facility] today

## 2018-03-23 ENCOUNTER — Telehealth: Payer: Self-pay | Admitting: Family Medicine

## 2018-03-23 NOTE — Telephone Encounter (Signed)
Please see if you can contact patient's husband and check on him.   I tried to call him to offer condolences about his wife passing away but couldn't get through.  Thanks.

## 2018-03-25 NOTE — Telephone Encounter (Signed)
Noted. Thanks.

## 2018-03-25 NOTE — Telephone Encounter (Signed)
Spoke with the husband and offered our condolences and he appreciated it.  Patient says he will call in for a CPE soon.

## 2018-04-03 ENCOUNTER — Other Ambulatory Visit: Payer: Self-pay | Admitting: Family Medicine

## 2018-04-15 DEATH — deceased

## 2018-08-12 ENCOUNTER — Ambulatory Visit: Payer: Medicare Other

## 2018-08-20 ENCOUNTER — Encounter: Payer: Medicare Other | Admitting: Family Medicine
